# Patient Record
Sex: Male | Born: 1945 | Race: White | Hispanic: No | Marital: Married
Health system: Southern US, Community
[De-identification: ages and names within clinical notes are randomized; demographics above are authoritative.]

## PROBLEM LIST (undated history)

## (undated) DIAGNOSIS — E119 Type 2 diabetes mellitus without complications: Secondary | ICD-10-CM

## (undated) DIAGNOSIS — T7840XA Allergy, unspecified, initial encounter: Secondary | ICD-10-CM

## (undated) DIAGNOSIS — I10 Essential (primary) hypertension: Secondary | ICD-10-CM

## (undated) DIAGNOSIS — E785 Hyperlipidemia, unspecified: Secondary | ICD-10-CM

## (undated) DIAGNOSIS — N400 Enlarged prostate without lower urinary tract symptoms: Secondary | ICD-10-CM

## (undated) DIAGNOSIS — N200 Calculus of kidney: Secondary | ICD-10-CM

## (undated) DIAGNOSIS — M109 Gout, unspecified: Secondary | ICD-10-CM

## (undated) DIAGNOSIS — I00 Rheumatic fever without heart involvement: Secondary | ICD-10-CM

## (undated) DIAGNOSIS — M1990 Unspecified osteoarthritis, unspecified site: Secondary | ICD-10-CM

## (undated) HISTORY — DX: Allergy, unspecified, initial encounter: T78.40XA

## (undated) HISTORY — DX: Hyperlipidemia, unspecified: E78.5

## (undated) HISTORY — PX: BACK SURGERY: SHX140

## (undated) HISTORY — DX: Rheumatic fever without heart involvement: I00

## (undated) HISTORY — DX: Calculus of kidney: N20.0

## (undated) HISTORY — DX: Essential (primary) hypertension: I10

## (undated) HISTORY — DX: Unspecified osteoarthritis, unspecified site: M19.90

## (undated) HISTORY — PX: OTHER SURGICAL HISTORY: SHX169

---

## 1998-04-25 ENCOUNTER — Ambulatory Visit (HOSPITAL_COMMUNITY): Admission: RE | Admit: 1998-04-25 | Discharge: 1998-04-25 | Payer: Self-pay | Admitting: Neurosurgery

## 1998-04-25 ENCOUNTER — Encounter: Payer: Self-pay | Admitting: Neurosurgery

## 1998-04-28 ENCOUNTER — Encounter: Payer: Self-pay | Admitting: Neurosurgery

## 1998-04-29 ENCOUNTER — Encounter: Payer: Self-pay | Admitting: Neurosurgery

## 1998-04-29 ENCOUNTER — Inpatient Hospital Stay (HOSPITAL_COMMUNITY): Admission: RE | Admit: 1998-04-29 | Discharge: 1998-04-30 | Payer: Self-pay | Admitting: Neurosurgery

## 1998-12-20 ENCOUNTER — Encounter: Payer: Self-pay | Admitting: Neurosurgery

## 1998-12-20 ENCOUNTER — Ambulatory Visit (HOSPITAL_COMMUNITY): Admission: RE | Admit: 1998-12-20 | Discharge: 1998-12-20 | Payer: Self-pay | Admitting: Neurosurgery

## 2015-02-10 ENCOUNTER — Ambulatory Visit: Payer: Self-pay | Admitting: Family Medicine

## 2015-02-18 ENCOUNTER — Ambulatory Visit (INDEPENDENT_AMBULATORY_CARE_PROVIDER_SITE_OTHER): Payer: PPO | Admitting: Family Medicine

## 2015-02-18 ENCOUNTER — Encounter: Payer: Self-pay | Admitting: Family Medicine

## 2015-02-18 VITALS — BP 125/75 | HR 71 | Resp 16 | Ht 70.0 in | Wt 295.6 lb

## 2015-02-18 DIAGNOSIS — E669 Obesity, unspecified: Secondary | ICD-10-CM | POA: Diagnosis not present

## 2015-02-18 DIAGNOSIS — I10 Essential (primary) hypertension: Secondary | ICD-10-CM

## 2015-02-18 DIAGNOSIS — I129 Hypertensive chronic kidney disease with stage 1 through stage 4 chronic kidney disease, or unspecified chronic kidney disease: Secondary | ICD-10-CM | POA: Insufficient documentation

## 2015-02-18 DIAGNOSIS — E785 Hyperlipidemia, unspecified: Secondary | ICD-10-CM | POA: Diagnosis not present

## 2015-02-18 DIAGNOSIS — E119 Type 2 diabetes mellitus without complications: Secondary | ICD-10-CM

## 2015-02-18 DIAGNOSIS — M10079 Idiopathic gout, unspecified ankle and foot: Secondary | ICD-10-CM

## 2015-02-18 DIAGNOSIS — E1121 Type 2 diabetes mellitus with diabetic nephropathy: Secondary | ICD-10-CM | POA: Insufficient documentation

## 2015-02-18 DIAGNOSIS — Z7689 Persons encountering health services in other specified circumstances: Secondary | ICD-10-CM

## 2015-02-18 DIAGNOSIS — E1169 Type 2 diabetes mellitus with other specified complication: Secondary | ICD-10-CM | POA: Insufficient documentation

## 2015-02-18 DIAGNOSIS — M109 Gout, unspecified: Secondary | ICD-10-CM

## 2015-02-18 DIAGNOSIS — M1990 Unspecified osteoarthritis, unspecified site: Secondary | ICD-10-CM | POA: Diagnosis not present

## 2015-02-18 NOTE — Patient Instructions (Signed)
Continue all current meds at present level except discontinue Aleve/Adv il

## 2015-02-18 NOTE — Progress Notes (Signed)
Name: Danny Patterson   MRN: [id number]    DOB: 1946/01/22   Date:02/18/2015       Progress Note  Subjective  Chief Complaint  Chief Complaint  Patient presents with  . Establish Care    HPI Here to establish care.  Has been going to [place] for care and meds.  Plans to change provider away from [place].    Has HBP, DM, Gout, elevated lipids.  Says he cannot take ACEs or ARBs.  Some question of renal insufficiency. Hx of kidney stones.   BSs at home- not taken.  Has angioedema with ACE/ARB.  No problem-specific assessment & plan notes found for this encounter.   Past Medical History  Diagnosis Date  . Allergy   . Arthritis   . Hypertension   . Hyperlipidemia   . Rheumatic fever   . Kidney stone     History reviewed. No pertinent past surgical history.  Family History  Problem Relation Age of Onset  . Parkinson's disease Father 79  . Kidney disease Maternal Grandmother     Social History   Social History  . Marital Status: Married    Spouse Name: N/A  . Number of Children: N/A  . Years of Education: N/A   Occupational History  . Not on file.   Social History Main Topics  . Smoking status: Never Smoker   . Smokeless tobacco: Never Used  . Alcohol Use: 1.8 oz/week    3 Cans of beer per week  . Drug Use: No  . Sexual Activity: Not on file   Other Topics Concern  . Not on file   Social History Narrative  . No narrative on file     Current outpatient prescriptions:  .  allopurinol (ZYLOPRIM) 300 MG tablet, Take 300 mg by mouth daily. 1.5 daily, Disp: , Rfl:  .  amLODipine (NORVASC) 10 MG tablet, Take 10 mg by mouth daily., Disp: , Rfl:  .  colchicine 0.6 MG tablet, Take 0.6 mg by mouth as needed (3 on day one of flare then one daily after.)., Disp: , Rfl:  .  furosemide (LASIX) 20 MG tablet, Take 20 mg by mouth daily., Disp: , Rfl:  .  labetalol (NORMODYNE) 200 MG tablet, Take 200 mg by mouth 2 (two) times daily., Disp: , Rfl:  .  magnesium gluconate (MAGONATE)  500 MG tablet, Take 500 mg by mouth daily., Disp: , Rfl:  .  metFORMIN (GLUCOPHAGE) 1000 MG tablet, Take 1,000 mg by mouth 2 (two) times daily with a meal., Disp: , Rfl:  .  naproxen sodium (ANAPROX) 220 MG tablet, Take 220 mg by mouth 2 (two) times daily with a meal., Disp: , Rfl:  .  omega-3 acid ethyl esters (LOVAZA) 1 G capsule, Take by mouth 2 (two) times daily., Disp: , Rfl:  .  simvastatin (ZOCOR) 40 MG tablet, Take 40 mg by mouth daily. 1/2 tab daily, Disp: , Rfl:  .  traMADol (ULTRAM) 50 MG tablet, Take 50 mg by mouth every 6 (six) hours as needed., Disp: , Rfl:   Allergies  Allergen Reactions  . Ace Inhibitors     Angioedema.     Review of Systems  Constitutional: Negative for fever, chills, weight loss and malaise/fatigue.  HENT: Negative for hearing loss.   Eyes: Negative for blurred vision and double vision.  Respiratory: Negative for cough, shortness of breath and wheezing.   Cardiovascular: Positive for leg swelling. Negative for chest pain and palpitations.  Gastrointestinal: Positive  for heartburn (occ. with certain foods.). Negative for abdominal pain and blood in stool.  Genitourinary: Negative for dysuria, urgency and frequency.  Musculoskeletal: Negative for myalgias and joint pain.  Skin: Negative for rash.  Neurological: Negative for dizziness, tremors, weakness and headaches.      Objective  Filed Vitals:   02/18/15 1457  BP: 125/75  Pulse: 71  Resp: 16  Height: 5\' 10"  (1.778 m)  Weight: 295 lb 9.6 oz (134.083 kg)    Physical Exam  Constitutional: He is oriented to person, place, and time and well-developed, well-nourished, and in no distress. No distress.  HENT:  Head: Normocephalic and atraumatic.  Eyes: Conjunctivae and EOM are normal. Pupils are equal, round, and reactive to light. No scleral icterus.  Neck: Normal range of motion. Neck supple. Carotid bruit is not present. No thyromegaly present.  Cardiovascular: Normal rate, regular rhythm  and normal heart sounds.  Exam reveals no gallop and no friction rub.   No murmur heard. Pulmonary/Chest: No respiratory distress. He has no wheezes. He has no rales.  Abdominal: Soft. Bowel sounds are normal. He exhibits no mass. There is hepatomegaly. There is no tenderness. There is no rebound.    obese  Musculoskeletal: Normal range of motion. He exhibits edema (trace bilateral pedal edema).  Lymphadenopathy:    He has no cervical adenopathy.  Neurological: He is alert and oriented to person, place, and time.       No results found for this or any previous visit (from the past 2160 hour(s)).   Assessment & Plan  Problem List Items Addressed This Visit      Cardiovascular and Mediastinum   Hypertension   Relevant Medications   simvastatin (ZOCOR) 40 MG tablet   furosemide (LASIX) 20 MG tablet   labetalol (NORMODYNE) 200 MG tablet   amLODipine (NORVASC) 10 MG tablet   omega-3 acid ethyl esters (LOVAZA) 1 G capsule   Other Relevant Orders   Comprehensive Metabolic Panel (CMET)     Endocrine   Diabetes (HCC)   Relevant Medications   metFORMIN (GLUCOPHAGE) 1000 MG tablet   simvastatin (ZOCOR) 40 MG tablet   Other Relevant Orders   HgB A1c     Musculoskeletal and Integument   Arthritis   Relevant Medications   colchicine 0.6 MG tablet   allopurinol (ZYLOPRIM) 300 MG tablet   naproxen sodium (ANAPROX) 220 MG tablet   traMADol (ULTRAM) 50 MG tablet   Other Relevant Orders   CBC with Differential     Other   Gout   Relevant Orders   Uric acid   Hyperlipidemia   Relevant Medications   simvastatin (ZOCOR) 40 MG tablet   furosemide (LASIX) 20 MG tablet   labetalol (NORMODYNE) 200 MG tablet   amLODipine (NORVASC) 10 MG tablet   omega-3 acid ethyl esters (LOVAZA) 1 G capsule   Other Relevant Orders   Lipid Profile   Encounter to establish care - Primary    Other Visit Diagnoses    Obesity        Relevant Medications    metFORMIN (GLUCOPHAGE) 1000 MG tablet     Other Relevant Orders    TSH       Meds ordered this encounter  Medications  . metFORMIN (GLUCOPHAGE) 1000 MG tablet    Sig: Take 1,000 mg by mouth 2 (two) times daily with a meal.  . colchicine 0.6 MG tablet    Sig: Take 0.6 mg by mouth as needed (3 on day one of  flare then one daily after.).  Marland Kitchen simvastatin (ZOCOR) 40 MG tablet    Sig: Take 40 mg by mouth daily. 1/2 tab daily  . furosemide (LASIX) 20 MG tablet    Sig: Take 20 mg by mouth daily.  Marland Kitchen labetalol (NORMODYNE) 200 MG tablet    Sig: Take 200 mg by mouth 2 (two) times daily.  Marland Kitchen allopurinol (ZYLOPRIM) 300 MG tablet    Sig: Take 300 mg by mouth daily. 1.5 daily  . amLODipine (NORVASC) 10 MG tablet    Sig: Take 10 mg by mouth daily.  . magnesium gluconate (MAGONATE) 500 MG tablet    Sig: Take 500 mg by mouth daily.  . naproxen sodium (ANAPROX) 220 MG tablet    Sig: Take 220 mg by mouth 2 (two) times daily with a meal.  . omega-3 acid ethyl esters (LOVAZA) 1 G capsule    Sig: Take by mouth 2 (two) times daily.  . traMADol (ULTRAM) 50 MG tablet    Sig: Take 50 mg by mouth every 6 (six) hours as needed.    1. Encounter to establish care   2. Essential hypertension  - Comprehensive Metabolic Panel (CMET)  3. Type 2 diabetes mellitus without complication, without long-term current use of insulin (HCC)  - HgB A1c  4. Acute gout of ankle, unspecified cause, unspecified laterality  - Uric acid  5. Hyperlipidemia  - Lipid Profile  6. Arthritis  - CBC with Differential  7. Obesity  - TSH

## 2015-02-27 LAB — COMPREHENSIVE METABOLIC PANEL
A/G RATIO: 2 (ref 1.1–2.5)
ALBUMIN: 4.4 g/dL (ref 3.6–4.8)
ALT: 25 IU/L (ref 0–44)
AST: 28 IU/L (ref 0–40)
Alkaline Phosphatase: 65 IU/L (ref 39–117)
BILIRUBIN TOTAL: 0.5 mg/dL (ref 0.0–1.2)
BUN / CREAT RATIO: 17 (ref 10–22)
BUN: 22 mg/dL (ref 8–27)
CALCIUM: 9.7 mg/dL (ref 8.6–10.2)
CHLORIDE: 101 mmol/L (ref 97–106)
CO2: 25 mmol/L (ref 18–29)
Creatinine, Ser: 1.31 mg/dL — ABNORMAL HIGH (ref 0.76–1.27)
GFR, EST AFRICAN AMERICAN: 64 mL/min/{1.73_m2} (ref >59)
GFR, EST NON AFRICAN AMERICAN: 55 mL/min/{1.73_m2} — AB (ref >59)
GLUCOSE: 101 mg/dL — AB (ref 65–99)
Globulin, Total: 2.2 g/dL (ref 1.5–4.5)
Potassium: 4.7 mmol/L (ref 3.5–5.2)
Sodium: 141 mmol/L (ref 136–144)
TOTAL PROTEIN: 6.6 g/dL (ref 6.0–8.5)

## 2015-02-27 LAB — CBC WITH DIFFERENTIAL/PLATELET
BASOS ABS: 0.1 10*3/uL (ref 0.0–0.2)
BASOS: 1 %
EOS (ABSOLUTE): 0.1 10*3/uL (ref 0.0–0.4)
EOS: 1 %
HEMATOCRIT: 36.5 % — AB (ref 37.5–51.0)
HEMOGLOBIN: 12.7 g/dL (ref 12.6–17.7)
IMMATURE GRANS (ABS): 0 10*3/uL (ref 0.0–0.1)
Immature Granulocytes: 0 %
LYMPHS ABS: 2.1 10*3/uL (ref 0.7–3.1)
Lymphs: 30 %
MCH: 31.8 pg (ref 26.6–33.0)
MCHC: 34.8 g/dL (ref 31.5–35.7)
MCV: 92 fL (ref 79–97)
MONOCYTES: 5 %
Monocytes Absolute: 0.3 10*3/uL (ref 0.1–0.9)
NEUTROS ABS: 4.4 10*3/uL (ref 1.4–7.0)
Neutrophils: 63 %
Platelets: 169 10*3/uL (ref 150–379)
RBC: 3.99 x10E6/uL — ABNORMAL LOW (ref 4.14–5.80)
RDW: 14.8 % (ref 12.3–15.4)
WBC: 6.9 10*3/uL (ref 3.4–10.8)

## 2015-02-27 LAB — LIPID PANEL
CHOL/HDL RATIO: 3.6 ratio (ref 0.0–5.0)
Cholesterol, Total: 127 mg/dL (ref 100–199)
HDL: 35 mg/dL — ABNORMAL LOW (ref >39)
LDL CALC: 62 mg/dL (ref 0–99)
Triglycerides: 150 mg/dL — ABNORMAL HIGH (ref 0–149)
VLDL CHOLESTEROL CAL: 30 mg/dL (ref 5–40)

## 2015-02-27 LAB — URIC ACID: Uric Acid: 5.4 mg/dL (ref 3.7–8.6)

## 2015-02-27 LAB — TSH: TSH: 2.58 u[IU]/mL (ref 0.450–4.500)

## 2015-02-27 LAB — HEMOGLOBIN A1C
ESTIMATED AVERAGE GLUCOSE: 131 mg/dL
HEMOGLOBIN A1C: 6.2 % — AB (ref 4.8–5.6)

## 2015-04-22 ENCOUNTER — Encounter: Payer: Self-pay | Admitting: Family Medicine

## 2015-04-22 ENCOUNTER — Ambulatory Visit (INDEPENDENT_AMBULATORY_CARE_PROVIDER_SITE_OTHER): Payer: PPO | Admitting: Family Medicine

## 2015-04-22 VITALS — BP 120/70 | HR 68 | Temp 98.2°F | Resp 16 | Ht 71.0 in | Wt 272.4 lb

## 2015-04-22 DIAGNOSIS — I10 Essential (primary) hypertension: Secondary | ICD-10-CM

## 2015-04-22 DIAGNOSIS — E119 Type 2 diabetes mellitus without complications: Secondary | ICD-10-CM | POA: Diagnosis not present

## 2015-04-22 DIAGNOSIS — E785 Hyperlipidemia, unspecified: Secondary | ICD-10-CM

## 2015-04-22 DIAGNOSIS — M109 Gout, unspecified: Secondary | ICD-10-CM

## 2015-04-22 DIAGNOSIS — M10079 Idiopathic gout, unspecified ankle and foot: Secondary | ICD-10-CM

## 2015-04-22 NOTE — Progress Notes (Signed)
Name: Danny Patterson   MRN: [id number]    DOB: 07-12-1945   Date:04/22/2015       Progress Note  Subjective  Chief Complaint  Chief Complaint  Patient presents with  . Hypertension    HPI Here for f/u of HBP.  Also has DM.  Does not check Sugars at home.  No gout flairs recently.  Taking his chol med.  No problem-specific assessment & plan notes found for this encounter.   Past Medical History  Diagnosis Date  . Allergy   . Arthritis   . Hypertension   . Hyperlipidemia   . Rheumatic fever   . Kidney stone     History reviewed. No pertinent past surgical history.  Family History  Problem Relation Age of Onset  . Parkinson's disease Father 80  . Kidney disease Maternal Grandmother     Social History   Social History  . Marital Status: Married    Spouse Name: N/A  . Number of Children: N/A  . Years of Education: N/A   Occupational History  . Not on file.   Social History Main Topics  . Smoking status: Never Smoker   . Smokeless tobacco: Never Used  . Alcohol Use: 1.8 oz/week    3 Cans of beer per week  . Drug Use: No  . Sexual Activity: Not on file   Other Topics Concern  . Not on file   Social History Narrative     Current outpatient prescriptions:  .  allopurinol (ZYLOPRIM) 300 MG tablet, Take 300 mg by mouth daily. 1.5 daily, Disp: , Rfl:  .  amLODipine (NORVASC) 10 MG tablet, Take 10 mg by mouth daily., Disp: , Rfl:  .  colchicine 0.6 MG tablet, Take 0.6 mg by mouth as needed (3 on day one of flare then one daily after.)., Disp: , Rfl:  .  labetalol (NORMODYNE) 200 MG tablet, Take 200 mg by mouth 2 (two) times daily., Disp: , Rfl:  .  magnesium gluconate (MAGONATE) 500 MG tablet, Take 500 mg by mouth daily., Disp: , Rfl:  .  metFORMIN (GLUCOPHAGE) 1000 MG tablet, Take 1,000 mg by mouth 2 (two) times daily with a meal., Disp: , Rfl:  .  naproxen sodium (ANAPROX) 220 MG tablet, Take 220 mg by mouth 2 (two) times daily with a meal., Disp: , Rfl:  .   omega-3 acid ethyl esters (LOVAZA) 1 G capsule, Take by mouth 2 (two) times daily., Disp: , Rfl:  .  simvastatin (ZOCOR) 40 MG tablet, Take 40 mg by mouth daily. 1/2 tab daily, Disp: , Rfl:  .  traMADol (ULTRAM) 50 MG tablet, Take 50 mg by mouth every 6 (six) hours as needed. Reported on 04/22/2015, Disp: , Rfl:   Allergies  Allergen Reactions  . Ace Inhibitors     Angioedema.     Review of Systems  Constitutional: Negative for fever, chills, weight loss and malaise/fatigue.  HENT: Negative for hearing loss.   Eyes: Negative for blurred vision and double vision.  Respiratory: Negative for cough, shortness of breath and wheezing.   Cardiovascular: Negative for chest pain, palpitations and leg swelling.  Gastrointestinal: Negative for heartburn, abdominal pain and blood in stool.  Genitourinary: Negative for dysuria, urgency and frequency.  Musculoskeletal: Positive for back pain. Negative for myalgias.  Skin: Negative for rash.  Neurological: Negative for dizziness, tremors, weakness and headaches.      Objective  Filed Vitals:   04/22/15 1342  BP: 120/70  Pulse: 68  Temp: 98.2 F (36.8 C)  TempSrc: Oral  Resp: 16  Height:  (1.803 m)  Weight: 272 lb 6.4 oz (123.56 kg)    Physical Exam  Constitutional: He is oriented to person, place, and time and well-developed, well-nourished, and in no distress. No distress.  Note 23# weight loss over past 2 months.  HENT:  Head: Normocephalic and atraumatic.  Eyes: Conjunctivae and EOM are normal. Pupils are equal, round, and reactive to light. No scleral icterus.  Neck: Normal range of motion. Neck supple. Carotid bruit is not present. No thyromegaly present.  Cardiovascular: Normal rate, regular rhythm and normal heart sounds.  Exam reveals no gallop and no friction rub.   No murmur heard. Pulmonary/Chest: Effort normal and breath sounds normal. No respiratory distress. He has no wheezes. He has no rales.  Abdominal: He  exhibits no distension and no mass. There is no tenderness.  Musculoskeletal: He exhibits edema (1+ bilateral pedal edema).  Lymphadenopathy:    He has no cervical adenopathy.  Neurological: He is alert and oriented to person, place, and time.  Vitals reviewed.      Recent Results (from the past 2160 hour(s))  Comprehensive Metabolic Panel (CMET)     Status: Abnormal   Collection Time: 02/26/15  2:04 PM  Result Value Ref Range   Glucose 101 (H) 65 - 99 mg/dL   BUN 22 8 - 27 mg/dL   Creatinine, Ser 2.95 (H) 0.76 - 1.27 mg/dL   GFR calc non Af Amer 55 (L) >59 mL/min/1.73   GFR calc Af Amer 64 >59 mL/min/1.73   BUN/Creatinine Ratio 17 10 - 22   Sodium 141 136 - 144 mmol/L    Comment: **Effective March 03, 2015 the reference interval**   for Sodium, Serum will be changing to:                                             134 - 144    Potassium 4.7 3.5 - 5.2 mmol/L   Chloride 101 97 - 106 mmol/L    Comment: **Effective March 03, 2015 the reference interval**   for Chloride, Serum will be changing to:                                              96 - 106    CO2 25 18 - 29 mmol/L   Calcium 9.7 8.6 - 10.2 mg/dL   Total Protein 6.6 6.0 - 8.5 g/dL   Albumin 4.4 3.6 - 4.8 g/dL   Globulin, Total 2.2 1.5 - 4.5 g/dL   Albumin/Globulin Ratio 2.0 1.1 - 2.5   Bilirubin Total 0.5 0.0 - 1.2 mg/dL   Alkaline Phosphatase 65 39 - 117 IU/L   AST 28 0 - 40 IU/L   ALT 25 0 - 44 IU/L  CBC with Differential     Status: Abnormal   Collection Time: 02/26/15  2:04 PM  Result Value Ref Range   WBC 6.9 3.4 - 10.8 x10E3/uL   RBC 3.99 (L) 4.14 - 5.80 x10E6/uL   Hemoglobin 12.7 12.6 - 17.7 g/dL   Hematocrit 62.1 (L) 30.8 - 51.0 %   MCV 92 79 - 97 fL   MCH 31.8 26.6 - 33.0 pg  MCHC 34.8 31.5 - 35.7 g/dL   RDW 16.1 09.6 - 04.5 %   Platelets 169 150 - 379 x10E3/uL   Neutrophils 63 %   Lymphs 30 %   Monocytes 5 %   Eos 1 %   Basos 1 %   Neutrophils Absolute 4.4 1.4 - 7.0 x10E3/uL    Lymphocytes Absolute 2.1 0.7 - 3.1 x10E3/uL   Monocytes Absolute 0.3 0.1 - 0.9 x10E3/uL   EOS (ABSOLUTE) 0.1 0.0 - 0.4 x10E3/uL   Basophils Absolute 0.1 0.0 - 0.2 x10E3/uL   Immature Granulocytes 0 %   Immature Grans (Abs) 0.0 0.0 - 0.1 x10E3/uL  Lipid Profile     Status: Abnormal   Collection Time: 02/26/15  2:04 PM  Result Value Ref Range   Cholesterol, Total 127 100 - 199 mg/dL   Triglycerides 409 (H) 0 - 149 mg/dL   HDL 35 (L) >81 mg/dL   VLDL Cholesterol Cal 30 5 - 40 mg/dL   LDL Calculated 62 0 - 99 mg/dL   Chol/HDL Ratio 3.6 0.0 - 5.0 ratio units    Comment:                                   T. Chol/HDL Ratio                                             Men  Women                               1/2 Avg.Risk  3.4    3.3                                   Avg.Risk  5.0    4.4                                2X Avg.Risk  9.6    7.1                                3X Avg.Risk 23.4   11.0   HgB A1c     Status: Abnormal   Collection Time: 02/26/15  2:04 PM  Result Value Ref Range   Hgb A1c MFr Bld 6.2 (H) 4.8 - 5.6 %    Comment:          Pre-diabetes: 5.7 - 6.4          Diabetes: >6.4          Glycemic control for adults with diabetes: <7.0    Est. average glucose Bld gHb Est-mCnc 131 mg/dL  TSH     Status: None   Collection Time: 02/26/15  2:04 PM  Result Value Ref Range   TSH 2.580 0.450 - 4.500 uIU/mL  Uric acid     Status: None   Collection Time: 02/26/15  2:04 PM  Result Value Ref Range   Uric Acid 5.4 3.7 - 8.6 mg/dL    Comment:            Therapeutic target for gout patients: <6.0  Assessment & Plan  Problem List Items Addressed This Visit      Cardiovascular and Mediastinum   Hypertension - Primary     Endocrine   Diabetes (HCC)     Other   Gout   Hyperlipidemia      No orders of the defined types were placed in this encounter.   1. Essential hypertension Cont. meds  2. Acute gout of ankle, unspecified cause, unspecified laterality Cont.  meds  3. Type 2 diabetes mellitus without complication, without long-term current use of insulin (HCC) Cont med  4. Hyperlipidemia Cont med

## 2015-04-22 NOTE — Patient Instructions (Addendum)
Plan to check CMP, CBC, A1c on return

## 2015-07-21 ENCOUNTER — Ambulatory Visit (INDEPENDENT_AMBULATORY_CARE_PROVIDER_SITE_OTHER): Payer: PPO | Admitting: Family Medicine

## 2015-07-21 ENCOUNTER — Encounter: Payer: Self-pay | Admitting: Family Medicine

## 2015-07-21 VITALS — BP 130/60 | HR 66 | Temp 97.8°F | Resp 16 | Ht 71.0 in | Wt 255.0 lb

## 2015-07-21 DIAGNOSIS — E083513 Diabetes mellitus due to underlying condition with proliferative diabetic retinopathy with macular edema, bilateral: Secondary | ICD-10-CM

## 2015-07-21 DIAGNOSIS — R7309 Other abnormal glucose: Secondary | ICD-10-CM

## 2015-07-21 DIAGNOSIS — M1000 Idiopathic gout, unspecified site: Secondary | ICD-10-CM

## 2015-07-21 DIAGNOSIS — M5387 Other specified dorsopathies, lumbosacral region: Secondary | ICD-10-CM

## 2015-07-21 DIAGNOSIS — I10 Essential (primary) hypertension: Secondary | ICD-10-CM

## 2015-07-21 DIAGNOSIS — M5431 Sciatica, right side: Secondary | ICD-10-CM

## 2015-07-21 DIAGNOSIS — Z794 Long term (current) use of insulin: Secondary | ICD-10-CM | POA: Diagnosis not present

## 2015-07-21 LAB — POCT GLYCOSYLATED HEMOGLOBIN (HGB A1C): Hemoglobin A1C: 5.8

## 2015-07-21 MED ORDER — METFORMIN HCL 500 MG PO TABS
ORAL_TABLET | ORAL | Status: DC
Start: 2015-07-21 — End: 2016-03-08

## 2015-07-21 NOTE — Progress Notes (Signed)
Name: Danny Patterson   MRN: [id number]    DOB: 27-Dec-1945   Date:07/21/2015       Progress Note  Subjective  Chief Complaint  Chief Complaint  Patient presents with  . Hypertension  . Diabetes    last A1c 6.2% 12/16    HPI Here for f/u of DM and HBP.  C/o R sciatic pain and some difficulty walking with some foot drop. He has lost weight through diet and exercise. No problem-specific assessment & plan notes found for this encounter.   Past Medical History  Diagnosis Date  . Allergy   . Arthritis   . Hypertension   . Hyperlipidemia   . Rheumatic fever   . Kidney stone     History reviewed. No pertinent past surgical history.  Family History  Problem Relation Age of Onset  . Parkinson's disease Father 2968  . Kidney disease Maternal Grandmother     Social History   Social History  . Marital Status: Married    Spouse Name: N/A  . Number of Children: N/A  . Years of Education: N/A   Occupational History  . Not on file.   Social History Main Topics  . Smoking status: Never Smoker   . Smokeless tobacco: Never Used  . Alcohol Use: 1.8 oz/week    3 Cans of beer per week  . Drug Use: No  . Sexual Activity: Not on file   Other Topics Concern  . Not on file   Social History Narrative     Current outpatient prescriptions:  .  allopurinol (ZYLOPRIM) 300 MG tablet, Take 300 mg by mouth daily. 1.5 daily, Disp: , Rfl:  .  amLODipine (NORVASC) 10 MG tablet, Take 10 mg by mouth daily., Disp: , Rfl:  .  colchicine 0.6 MG tablet, Take 0.6 mg by mouth as needed (3 on day one of flare then one daily after.)., Disp: , Rfl:  .  labetalol (NORMODYNE) 200 MG tablet, Take 200 mg by mouth 2 (two) times daily., Disp: , Rfl:  .  magnesium gluconate (MAGONATE) 500 MG tablet, Take 500 mg by mouth daily., Disp: , Rfl:  .  metFORMIN (GLUCOPHAGE) 500 MG tablet, Take 1 tablet each AM with breakfast., Disp: 90 tablet, Rfl: 3 .  naproxen sodium (ANAPROX) 220 MG tablet, Take 220 mg by mouth 2  (two) times daily with a meal., Disp: , Rfl:  .  omega-3 acid ethyl esters (LOVAZA) 1 G capsule, Take by mouth 2 (two) times daily., Disp: , Rfl:  .  simvastatin (ZOCOR) 40 MG tablet, Take 40 mg by mouth daily. 1/2 tab daily, Disp: , Rfl:  .  traMADol (ULTRAM) 50 MG tablet, Take 50 mg by mouth every 6 (six) hours as needed. Reported on 04/22/2015, Disp: , Rfl:   Allergies  Allergen Reactions  . Ace Inhibitors     Angioedema.     Review of Systems  Constitutional: Negative for fever, chills, weight loss and malaise/fatigue.  HENT: Negative for hearing loss.   Eyes: Negative for blurred vision and double vision.  Respiratory: Negative for cough, shortness of breath and wheezing.   Cardiovascular: Negative for chest pain, palpitations and leg swelling.  Gastrointestinal: Negative for heartburn, abdominal pain and blood in stool.  Genitourinary: Negative for dysuria, urgency and frequency.  Musculoskeletal: Positive for back pain.  Skin: Negative for rash.  Neurological: Negative for tremors, weakness and headaches.       R foot drop with abnormal, unsure gait.  Objective  Filed Vitals:   07/21/15 1257 07/21/15 1328  BP: 108/74 130/60  Pulse: 66   Temp: 97.8 F (36.6 C)   TempSrc: Oral   Resp: 16   Height:  (1.803 m)   Weight: 255 lb (115.667 kg)     Physical Exam  Constitutional: He is oriented to person, place, and time and well-developed, well-nourished, and in no distress. No distress.  HENT:  Head: Normocephalic and atraumatic.  Eyes: Conjunctivae and EOM are normal. Pupils are equal, round, and reactive to light. No scleral icterus.  Neck: Normal range of motion. Neck supple. Carotid bruit is not present. No thyromegaly present.  Cardiovascular: Normal rate, regular rhythm, normal heart sounds and intact distal pulses.  Exam reveals no gallop and no friction rub.   No murmur heard. Pulmonary/Chest: Effort normal and breath sounds normal. No respiratory  distress. He has no wheezes. He has no rales. He exhibits no tenderness.  Abdominal: Soft. Bowel sounds are normal. He exhibits no distension, no abdominal bruit and no mass. There is no tenderness.  Musculoskeletal: He exhibits edema (1+ pwedal edema bilaterally.).  Lymphadenopathy:    He has no cervical adenopathy.  Neurological: He is alert and oriented to person, place, and time.  R sciatica with R foot drop.  Vitals reviewed.      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 metFORMIN (GLUCOPHAGE) 500 MG tablet    Sig: Take 1 tablet each AM with breakfast.    Dispense:  90 tablet    Refill:  3   1. Elevated glucose  - POCT HgB A1C-5.8  2. 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 - metFORMIN (GLUCOPHAGE) 500 MG tablet; Take 1 tablet each AM with breakfast.  Dispense: 90 tablet; Refill: 3 (decreased from 1000 mg/d).  3. Essential hypertension Cont. Am lodipine  4. 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 Idiopathic gout, unspecified chronicity, unspecified site Cont. Allopurinol

## 2015-07-28 DIAGNOSIS — M5416 Radiculopathy, lumbar region: Secondary | ICD-10-CM | POA: Diagnosis not present

## 2015-07-28 DIAGNOSIS — M549 Dorsalgia, unspecified: Secondary | ICD-10-CM | POA: Diagnosis not present

## 2015-08-01 DIAGNOSIS — M4806 Spinal stenosis, lumbar region: Secondary | ICD-10-CM | POA: Diagnosis not present

## 2015-08-01 DIAGNOSIS — M5416 Radiculopathy, lumbar region: Secondary | ICD-10-CM | POA: Diagnosis not present

## 2015-08-04 DIAGNOSIS — M5137 Other intervertebral disc degeneration, lumbosacral region: Secondary | ICD-10-CM | POA: Diagnosis not present

## 2015-08-04 DIAGNOSIS — Z6834 Body mass index (BMI) 34.0-34.9, adult: Secondary | ICD-10-CM | POA: Diagnosis not present

## 2015-08-19 ENCOUNTER — Other Ambulatory Visit: Payer: Self-pay | Admitting: Neurosurgery

## 2015-09-05 ENCOUNTER — Telehealth: Payer: Self-pay | Admitting: Family Medicine

## 2015-09-05 DIAGNOSIS — Z0100 Encounter for examination of eyes and vision without abnormal findings: Principal | ICD-10-CM

## 2015-09-05 DIAGNOSIS — E119 Type 2 diabetes mellitus without complications: Secondary | ICD-10-CM

## 2015-09-05 NOTE — Telephone Encounter (Signed)
Dr. Juanetta GoslingHawkins recommended the pt see an eye doctor.  Who should they schedule with at [facility] in regards to diabetes.  Patty's call back number is [phone]

## 2015-09-05 NOTE — Telephone Encounter (Signed)
Referral entered for eye exam.

## 2015-09-24 ENCOUNTER — Encounter (HOSPITAL_COMMUNITY)
Admission: RE | Admit: 2015-09-24 | Discharge: 2015-09-24 | Disposition: A | Discharge status: Home / Self Care | Payer: PPO | Source: Ambulatory Visit | Attending: Neurosurgery | Admitting: Neurosurgery

## 2015-09-24 ENCOUNTER — Encounter (HOSPITAL_COMMUNITY): Payer: Self-pay

## 2015-09-24 DIAGNOSIS — I10 Essential (primary) hypertension: Secondary | ICD-10-CM | POA: Insufficient documentation

## 2015-09-24 DIAGNOSIS — E119 Type 2 diabetes mellitus without complications: Secondary | ICD-10-CM | POA: Diagnosis not present

## 2015-09-24 DIAGNOSIS — Z7984 Long term (current) use of oral hypoglycemic drugs: Secondary | ICD-10-CM | POA: Diagnosis not present

## 2015-09-24 DIAGNOSIS — Z01818 Encounter for other preprocedural examination: Secondary | ICD-10-CM | POA: Insufficient documentation

## 2015-09-24 DIAGNOSIS — M5137 Other intervertebral disc degeneration, lumbosacral region: Secondary | ICD-10-CM | POA: Diagnosis not present

## 2015-09-24 DIAGNOSIS — I4510 Unspecified right bundle-branch block: Secondary | ICD-10-CM | POA: Insufficient documentation

## 2015-09-24 DIAGNOSIS — Z79899 Other long term (current) drug therapy: Secondary | ICD-10-CM | POA: Insufficient documentation

## 2015-09-24 DIAGNOSIS — Z0183 Encounter for blood typing: Secondary | ICD-10-CM | POA: Insufficient documentation

## 2015-09-24 DIAGNOSIS — Z01812 Encounter for preprocedural laboratory examination: Secondary | ICD-10-CM | POA: Diagnosis not present

## 2015-09-24 DIAGNOSIS — I440 Atrioventricular block, first degree: Secondary | ICD-10-CM | POA: Diagnosis not present

## 2015-09-24 DIAGNOSIS — E785 Hyperlipidemia, unspecified: Secondary | ICD-10-CM | POA: Insufficient documentation

## 2015-09-24 HISTORY — DX: Gout, unspecified: M10.9

## 2015-09-24 HISTORY — DX: Type 2 diabetes mellitus without complications: E11.9

## 2015-09-24 LAB — CBC
HCT: 36.7 % — ABNORMAL LOW (ref 39.0–52.0)
HEMOGLOBIN: 12.4 g/dL — AB (ref 13.0–17.0)
MCH: 30.9 pg (ref 26.0–34.0)
MCHC: 33.8 g/dL (ref 30.0–36.0)
MCV: 91.5 fL (ref 78.0–100.0)
PLATELETS: 194 10*3/uL (ref 150–400)
RBC: 4.01 MIL/uL — AB (ref 4.22–5.81)
RDW: 13.9 % (ref 11.5–15.5)
WBC: 6.8 10*3/uL (ref 4.0–10.5)

## 2015-09-24 LAB — BASIC METABOLIC PANEL
ANION GAP: 9 (ref 5–15)
BUN: 28 mg/dL — ABNORMAL HIGH (ref 6–20)
CHLORIDE: 99 mmol/L — AB (ref 101–111)
CO2: 26 mmol/L (ref 22–32)
Calcium: 9.6 mg/dL (ref 8.9–10.3)
Creatinine, Ser: 1.34 mg/dL — ABNORMAL HIGH (ref 0.61–1.24)
GFR calc Af Amer: >60 mL/min (ref >60)
GFR, EST NON AFRICAN AMERICAN: 52 mL/min — AB (ref >60)
Glucose, Bld: 95 mg/dL (ref 65–99)
POTASSIUM: 4.7 mmol/L (ref 3.5–5.1)
SODIUM: 134 mmol/L — AB (ref 135–145)

## 2015-09-24 LAB — TYPE AND SCREEN
ABO/RH(D): O POS
ANTIBODY SCREEN: NEGATIVE

## 2015-09-24 LAB — SURGICAL PCR SCREEN
MRSA, PCR: NEGATIVE
Staphylococcus aureus: NEGATIVE

## 2015-09-24 LAB — GLUCOSE, CAPILLARY: GLUCOSE-CAPILLARY: 89 mg/dL (ref 65–99)

## 2015-09-24 LAB — ABO/RH: ABO/RH(D): O POS

## 2015-09-24 NOTE — Pre-Procedure Instructions (Signed)
    Danny Patterson  09/24/2015      [id number] [place], [place] - [address] [name] [id number] Phone: [phone] Fax: [phone]    Your procedure is scheduled on 10/01/15.  [facility] at 630 A.M.  Call this number if you have problems the morning of surgery:  [phone]   Remember:  Do not eat food or drink liquids after midnight.  Take these medicines the morning of surgery with A SIP OF WATER --norvasc,labetalol   Do not wear jewelry, make-up or nail polish.  Do not wear lotions, powders, or perfumes.  You may wear deoderant.  Do not shave 48 hours prior to surgery.  Men may shave face and neck.  Do not bring valuables to the hospital.  [facility] is not responsible for any belongings or valuables.  Contacts, dentures or bridgework may not be worn into surgery.  Leave your suitcase in the car.  After surgery it may be brought to your room.  For patients admitted to the hospital, discharge time will be determined by your treatment team.  Patients discharged the day of surgery will not be allowed to drive home.   Name and phone number of your driver:    Special instructions:    Please read over the following fact sheets that you were given. MRSA Information

## 2015-09-25 LAB — HEMOGLOBIN A1C
Hgb A1c MFr Bld: 5.3 % (ref 4.8–5.6)
Mean Plasma Glucose: 105 mg/dL

## 2015-09-25 NOTE — Progress Notes (Signed)
Anesthesia Chart Review:  Pt is a 70 year old male scheduled for L2-3, L3-4, L4-5, L5-S1 decompression/ fusion/ pedicle screws on 10/01/2015 with Hilda LiasErnesto Botero, MD.   PMH includes:  HTN, DM, hyperlipidemia. Never smoker. BMI 35  Medications include: amlodipine, lasix, labetalol, metformin, simvastatin  Preoperative labs reviewed.  HgbA1c 5.3, glucose 95  EKG 09/24/15: Sinus rhythm with 1st degree A-V block. 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   If no changes, I anticipate pt can proceed with surgery as scheduled.   Rica Mastngela Keiva Dina, FNP-BC [facility]/Anesthesiology Phone: [phone] 09/25/2015 3:46 PM

## 2015-09-29 ENCOUNTER — Telehealth: Payer: Self-pay

## 2015-09-29 ENCOUNTER — Encounter: Payer: Self-pay | Admitting: Family Medicine

## 2015-09-29 DIAGNOSIS — R9431 Abnormal electrocardiogram [ECG] [EKG]: Secondary | ICD-10-CM

## 2015-09-29 NOTE — Telephone Encounter (Signed)
[id number] 

## 2015-09-29 NOTE — Progress Notes (Signed)
Since this is different from last EKG, I am going to get a Cardiology cnjsult to clear for surgery

## 2015-09-29 NOTE — Telephone Encounter (Signed)
Referral to Cardiology appt is scheduled for 10/22/2015 for [facility] [place] with Dr Herbie BaltimoreHarding. Patient will be called to be worked in as soon as [employer] account executivetomorrow. sd

## 2015-09-29 NOTE — Progress Notes (Signed)
Since this is new finding I am arranging Card. Consult to clear for surgery.

## 2015-09-30 MED ORDER — CEFAZOLIN SODIUM-DEXTROSE 2-4 GM/100ML-% IV SOLN
2.0000 g | INTRAVENOUS | Status: AC
  Administered 2015-10-01 (×2): 2 g via INTRAVENOUS
  Filled 2015-09-30: qty 100

## 2015-09-30 NOTE — Progress Notes (Signed)
Cardiology clearance appt is being arranged.-jh

## 2015-10-01 ENCOUNTER — Inpatient Hospital Stay (HOSPITAL_COMMUNITY): Payer: PPO

## 2015-10-01 ENCOUNTER — Encounter (HOSPITAL_COMMUNITY): Payer: Self-pay | Admitting: Certified Registered Nurse Anesthetist

## 2015-10-01 ENCOUNTER — Inpatient Hospital Stay (HOSPITAL_COMMUNITY)
Admission: RE | Admit: 2015-10-01 | Discharge: 2015-10-15 | DRG: 459 | Disposition: A | Discharge status: Rehabilitation Facility | Payer: PPO | Source: Ambulatory Visit | Attending: Internal Medicine | Admitting: Internal Medicine

## 2015-10-01 ENCOUNTER — Inpatient Hospital Stay (HOSPITAL_COMMUNITY): Payer: PPO | Admitting: Certified Registered Nurse Anesthetist

## 2015-10-01 ENCOUNTER — Inpatient Hospital Stay (HOSPITAL_COMMUNITY): Payer: PPO | Admitting: Emergency Medicine

## 2015-10-01 ENCOUNTER — Encounter (HOSPITAL_COMMUNITY)
Admission: RE | Disposition: A | Discharge status: Rehabilitation Facility | Payer: Self-pay | Source: Ambulatory Visit | Attending: Neurosurgery

## 2015-10-01 DIAGNOSIS — G9340 Encephalopathy, unspecified: Secondary | ICD-10-CM

## 2015-10-01 DIAGNOSIS — J9601 Acute respiratory failure with hypoxia: Secondary | ICD-10-CM | POA: Diagnosis not present

## 2015-10-01 DIAGNOSIS — R410 Disorientation, unspecified: Secondary | ICD-10-CM | POA: Diagnosis not present

## 2015-10-01 DIAGNOSIS — R0689 Other abnormalities of breathing: Secondary | ICD-10-CM | POA: Diagnosis not present

## 2015-10-01 DIAGNOSIS — Y92230 Patient room in hospital as the place of occurrence of the external cause: Secondary | ICD-10-CM | POA: Diagnosis not present

## 2015-10-01 DIAGNOSIS — F0280 Dementia in other diseases classified elsewhere without behavioral disturbance: Secondary | ICD-10-CM

## 2015-10-01 DIAGNOSIS — R609 Edema, unspecified: Secondary | ICD-10-CM | POA: Diagnosis not present

## 2015-10-01 DIAGNOSIS — D649 Anemia, unspecified: Secondary | ICD-10-CM | POA: Diagnosis not present

## 2015-10-01 DIAGNOSIS — Z7984 Long term (current) use of oral hypoglycemic drugs: Secondary | ICD-10-CM | POA: Diagnosis not present

## 2015-10-01 DIAGNOSIS — E785 Hyperlipidemia, unspecified: Secondary | ICD-10-CM | POA: Diagnosis not present

## 2015-10-01 DIAGNOSIS — I248 Other forms of acute ischemic heart disease: Secondary | ICD-10-CM | POA: Diagnosis not present

## 2015-10-01 DIAGNOSIS — E872 Acidosis: Secondary | ICD-10-CM | POA: Diagnosis present

## 2015-10-01 DIAGNOSIS — J9600 Acute respiratory failure, unspecified whether with hypoxia or hypercapnia: Secondary | ICD-10-CM | POA: Diagnosis not present

## 2015-10-01 DIAGNOSIS — J9811 Atelectasis: Secondary | ICD-10-CM | POA: Diagnosis not present

## 2015-10-01 DIAGNOSIS — M4316 Spondylolisthesis, lumbar region: Secondary | ICD-10-CM | POA: Diagnosis not present

## 2015-10-01 DIAGNOSIS — R339 Retention of urine, unspecified: Secondary | ICD-10-CM | POA: Diagnosis not present

## 2015-10-01 DIAGNOSIS — Z981 Arthrodesis status: Secondary | ICD-10-CM

## 2015-10-01 DIAGNOSIS — N182 Chronic kidney disease, stage 2 (mild): Secondary | ICD-10-CM | POA: Diagnosis present

## 2015-10-01 DIAGNOSIS — E876 Hypokalemia: Secondary | ICD-10-CM | POA: Diagnosis not present

## 2015-10-01 DIAGNOSIS — M1990 Unspecified osteoarthritis, unspecified site: Secondary | ICD-10-CM | POA: Diagnosis not present

## 2015-10-01 DIAGNOSIS — E1310 Other specified diabetes mellitus with ketoacidosis without coma: Secondary | ICD-10-CM | POA: Diagnosis not present

## 2015-10-01 DIAGNOSIS — M5117 Intervertebral disc disorders with radiculopathy, lumbosacral region: Secondary | ICD-10-CM | POA: Diagnosis not present

## 2015-10-01 DIAGNOSIS — D62 Acute posthemorrhagic anemia: Secondary | ICD-10-CM

## 2015-10-01 DIAGNOSIS — E119 Type 2 diabetes mellitus without complications: Secondary | ICD-10-CM | POA: Diagnosis present

## 2015-10-01 DIAGNOSIS — R4182 Altered mental status, unspecified: Secondary | ICD-10-CM | POA: Diagnosis not present

## 2015-10-01 DIAGNOSIS — Z4659 Encounter for fitting and adjustment of other gastrointestinal appliance and device: Secondary | ICD-10-CM

## 2015-10-01 DIAGNOSIS — R579 Shock, unspecified: Secondary | ICD-10-CM | POA: Diagnosis not present

## 2015-10-01 DIAGNOSIS — Z79899 Other long term (current) drug therapy: Secondary | ICD-10-CM

## 2015-10-01 DIAGNOSIS — M4726 Other spondylosis with radiculopathy, lumbar region: Secondary | ICD-10-CM | POA: Diagnosis not present

## 2015-10-01 DIAGNOSIS — I10 Essential (primary) hypertension: Secondary | ICD-10-CM | POA: Diagnosis present

## 2015-10-01 DIAGNOSIS — E870 Hyperosmolality and hypernatremia: Secondary | ICD-10-CM | POA: Diagnosis not present

## 2015-10-01 DIAGNOSIS — J189 Pneumonia, unspecified organism: Secondary | ICD-10-CM

## 2015-10-01 DIAGNOSIS — M51369 Other intervertebral disc degeneration, lumbar region without mention of lumbar back pain or lower extremity pain: Secondary | ICD-10-CM | POA: Diagnosis present

## 2015-10-01 DIAGNOSIS — J9602 Acute respiratory failure with hypercapnia: Secondary | ICD-10-CM | POA: Diagnosis not present

## 2015-10-01 DIAGNOSIS — G9341 Metabolic encephalopathy: Secondary | ICD-10-CM | POA: Diagnosis not present

## 2015-10-01 DIAGNOSIS — J690 Pneumonitis due to inhalation of food and vomit: Secondary | ICD-10-CM | POA: Diagnosis not present

## 2015-10-01 DIAGNOSIS — R451 Restlessness and agitation: Secondary | ICD-10-CM | POA: Diagnosis not present

## 2015-10-01 DIAGNOSIS — Z452 Encounter for adjustment and management of vascular access device: Secondary | ICD-10-CM

## 2015-10-01 DIAGNOSIS — I129 Hypertensive chronic kidney disease with stage 1 through stage 4 chronic kidney disease, or unspecified chronic kidney disease: Secondary | ICD-10-CM | POA: Diagnosis present

## 2015-10-01 DIAGNOSIS — R34 Anuria and oliguria: Secondary | ICD-10-CM

## 2015-10-01 DIAGNOSIS — Z794 Long term (current) use of insulin: Secondary | ICD-10-CM | POA: Diagnosis not present

## 2015-10-01 DIAGNOSIS — R0600 Dyspnea, unspecified: Secondary | ICD-10-CM

## 2015-10-01 DIAGNOSIS — R931 Abnormal findings on diagnostic imaging of heart and coronary circulation: Secondary | ICD-10-CM | POA: Diagnosis not present

## 2015-10-01 DIAGNOSIS — T424X5A Adverse effect of benzodiazepines, initial encounter: Secondary | ICD-10-CM | POA: Diagnosis not present

## 2015-10-01 DIAGNOSIS — N183 Chronic kidney disease, stage 3 unspecified: Secondary | ICD-10-CM | POA: Diagnosis present

## 2015-10-01 DIAGNOSIS — E1121 Type 2 diabetes mellitus with diabetic nephropathy: Secondary | ICD-10-CM

## 2015-10-01 DIAGNOSIS — D696 Thrombocytopenia, unspecified: Secondary | ICD-10-CM | POA: Diagnosis present

## 2015-10-01 DIAGNOSIS — T40605A Adverse effect of unspecified narcotics, initial encounter: Secondary | ICD-10-CM | POA: Diagnosis not present

## 2015-10-01 DIAGNOSIS — R05 Cough: Secondary | ICD-10-CM

## 2015-10-01 DIAGNOSIS — M21371 Foot drop, right foot: Secondary | ICD-10-CM | POA: Diagnosis not present

## 2015-10-01 DIAGNOSIS — M4317 Spondylolisthesis, lumbosacral region: Secondary | ICD-10-CM | POA: Diagnosis not present

## 2015-10-01 DIAGNOSIS — G960 Cerebrospinal fluid leak: Secondary | ICD-10-CM | POA: Diagnosis not present

## 2015-10-01 DIAGNOSIS — M5116 Intervertebral disc disorders with radiculopathy, lumbar region: Secondary | ICD-10-CM | POA: Diagnosis not present

## 2015-10-01 DIAGNOSIS — E083513 Diabetes mellitus due to underlying condition with proliferative diabetic retinopathy with macular edema, bilateral: Secondary | ICD-10-CM | POA: Diagnosis not present

## 2015-10-01 DIAGNOSIS — R059 Cough, unspecified: Secondary | ICD-10-CM

## 2015-10-01 DIAGNOSIS — M5136 Other intervertebral disc degeneration, lumbar region: Secondary | ICD-10-CM | POA: Diagnosis present

## 2015-10-01 DIAGNOSIS — I959 Hypotension, unspecified: Secondary | ICD-10-CM

## 2015-10-01 DIAGNOSIS — M545 Low back pain: Secondary | ICD-10-CM | POA: Diagnosis not present

## 2015-10-01 DIAGNOSIS — R0902 Hypoxemia: Secondary | ICD-10-CM | POA: Diagnosis not present

## 2015-10-01 DIAGNOSIS — G4733 Obstructive sleep apnea (adult) (pediatric): Secondary | ICD-10-CM

## 2015-10-01 DIAGNOSIS — R938 Abnormal findings on diagnostic imaging of other specified body structures: Secondary | ICD-10-CM | POA: Diagnosis not present

## 2015-10-01 DIAGNOSIS — M5127 Other intervertebral disc displacement, lumbosacral region: Secondary | ICD-10-CM | POA: Diagnosis not present

## 2015-10-01 DIAGNOSIS — E878 Other disorders of electrolyte and fluid balance, not elsewhere classified: Secondary | ICD-10-CM | POA: Diagnosis not present

## 2015-10-01 DIAGNOSIS — M109 Gout, unspecified: Secondary | ICD-10-CM | POA: Diagnosis present

## 2015-10-01 DIAGNOSIS — R7989 Other specified abnormal findings of blood chemistry: Secondary | ICD-10-CM | POA: Diagnosis not present

## 2015-10-01 DIAGNOSIS — Z419 Encounter for procedure for purposes other than remedying health state, unspecified: Secondary | ICD-10-CM

## 2015-10-01 DIAGNOSIS — J9690 Respiratory failure, unspecified, unspecified whether with hypoxia or hypercapnia: Secondary | ICD-10-CM

## 2015-10-01 DIAGNOSIS — R195 Other fecal abnormalities: Secondary | ICD-10-CM

## 2015-10-01 DIAGNOSIS — R9389 Abnormal findings on diagnostic imaging of other specified body structures: Secondary | ICD-10-CM

## 2015-10-01 DIAGNOSIS — R570 Cardiogenic shock: Secondary | ICD-10-CM | POA: Diagnosis not present

## 2015-10-01 DIAGNOSIS — E1169 Type 2 diabetes mellitus with other specified complication: Secondary | ICD-10-CM

## 2015-10-01 DIAGNOSIS — Z978 Presence of other specified devices: Secondary | ICD-10-CM

## 2015-10-01 DIAGNOSIS — Z87891 Personal history of nicotine dependence: Secondary | ICD-10-CM

## 2015-10-01 DIAGNOSIS — G3183 Dementia with Lewy bodies: Secondary | ICD-10-CM | POA: Diagnosis present

## 2015-10-01 DIAGNOSIS — Z6833 Body mass index (BMI) 33.0-33.9, adult: Secondary | ICD-10-CM

## 2015-10-01 DIAGNOSIS — F4321 Adjustment disorder with depressed mood: Secondary | ICD-10-CM | POA: Diagnosis not present

## 2015-10-01 DIAGNOSIS — R4781 Slurred speech: Secondary | ICD-10-CM

## 2015-10-01 HISTORY — PX: POSTERIOR LUMBAR FUSION 4 LEVEL: SHX6037

## 2015-10-01 LAB — BASIC METABOLIC PANEL
ANION GAP: 6 (ref 5–15)
BUN: 17 mg/dL (ref 6–20)
CHLORIDE: 102 mmol/L (ref 101–111)
CO2: 24 mmol/L (ref 22–32)
Calcium: 8 mg/dL — ABNORMAL LOW (ref 8.9–10.3)
Creatinine, Ser: 1.31 mg/dL — ABNORMAL HIGH (ref 0.61–1.24)
GFR calc non Af Amer: 54 mL/min — ABNORMAL LOW (ref >60)
Glucose, Bld: 170 mg/dL — ABNORMAL HIGH (ref 65–99)
POTASSIUM: 4.7 mmol/L (ref 3.5–5.1)
SODIUM: 132 mmol/L — AB (ref 135–145)

## 2015-10-01 LAB — GLUCOSE, CAPILLARY
GLUCOSE-CAPILLARY: 180 mg/dL — AB (ref 65–99)
GLUCOSE-CAPILLARY: 167 mg/dL — AB (ref 65–99)
Glucose-Capillary: 156 mg/dL — ABNORMAL HIGH (ref 65–99)
Glucose-Capillary: 179 mg/dL — ABNORMAL HIGH (ref 65–99)
Glucose-Capillary: 94 mg/dL (ref 65–99)

## 2015-10-01 LAB — CBC
HCT: 31 % — ABNORMAL LOW (ref 39.0–52.0)
Hemoglobin: 10.3 g/dL — ABNORMAL LOW (ref 13.0–17.0)
MCH: 30.6 pg (ref 26.0–34.0)
MCHC: 33.2 g/dL (ref 30.0–36.0)
MCV: 92 fL (ref 78.0–100.0)
PLATELETS: 136 10*3/uL — AB (ref 150–400)
RBC: 3.37 MIL/uL — AB (ref 4.22–5.81)
RDW: 14.1 % (ref 11.5–15.5)
WBC: 11.8 10*3/uL — AB (ref 4.0–10.5)

## 2015-10-01 SURGERY — POSTERIOR LUMBAR FUSION 4 LEVEL
Anesthesia: General | Site: Spine Lumbar

## 2015-10-01 MED ORDER — VANCOMYCIN HCL 1000 MG IV SOLR
INTRAVENOUS | Status: DC | PRN
  Administered 2015-10-01: 1000 mg via TOPICAL

## 2015-10-01 MED ORDER — PHENYLEPHRINE HCL 10 MG/ML IJ SOLN
10.0000 mg | INTRAVENOUS | Status: DC | PRN
  Administered 2015-10-01: 15 ug/min via INTRAVENOUS
  Administered 2015-10-01: 13:00:00 via INTRAVENOUS

## 2015-10-01 MED ORDER — LACTATED RINGERS IV SOLN
INTRAVENOUS | Status: DC | PRN
  Administered 2015-10-01 (×2): via INTRAVENOUS

## 2015-10-01 MED ORDER — PHENOL 1.4 % MT LIQD: 1.0000 | OROMUCOSAL | Status: DC | PRN

## 2015-10-01 MED ORDER — OXYCODONE-ACETAMINOPHEN 5-325 MG PO TABS
1.0000 | ORAL_TABLET | ORAL | Status: DC | PRN
  Administered 2015-10-01 – 2015-10-02 (×2): 2 via ORAL
  Administered 2015-10-02: 1 via ORAL
  Administered 2015-10-02 – 2015-10-03 (×6): 2 via ORAL
  Administered 2015-10-04 (×2): 1 via ORAL
  Administered 2015-10-04: 2 via ORAL
  Administered 2015-10-05: 1 via ORAL
  Filled 2015-10-01: qty 2
  Filled 2015-10-01: qty 1
  Filled 2015-10-01 (×5): qty 2
  Filled 2015-10-01 (×3): qty 1
  Filled 2015-10-01: qty 2
  Filled 2015-10-01: qty 1
  Filled 2015-10-01 (×2): qty 2

## 2015-10-01 MED ORDER — THROMBIN 20000 UNITS EX KIT
PACK | CUTANEOUS | Status: DC | PRN
  Administered 2015-10-01 (×2): 20000 [IU] via TOPICAL

## 2015-10-01 MED ORDER — SODIUM CHLORIDE 0.9 % IV SOLN
INTRAVENOUS | Status: DC | PRN
  Administered 2015-10-01: 11:00:00 via INTRAVENOUS

## 2015-10-01 MED ORDER — FENTANYL CITRATE (PF) 100 MCG/2ML IJ SOLN
INTRAMUSCULAR | Status: DC | PRN
  Administered 2015-10-01: 100 ug via INTRAVENOUS
  Administered 2015-10-01 (×4): 50 ug via INTRAVENOUS

## 2015-10-01 MED ORDER — SODIUM CHLORIDE 0.9 % IV SOLN: 250.0000 mL | INTRAVENOUS | Status: DC

## 2015-10-01 MED ORDER — MIDAZOLAM HCL 2 MG/2ML IJ SOLN
INTRAMUSCULAR | Status: AC
  Filled 2015-10-01: qty 2

## 2015-10-01 MED ORDER — ROCURONIUM BROMIDE 50 MG/5ML IV SOLN
INTRAVENOUS | Status: AC
  Filled 2015-10-01: qty 3

## 2015-10-01 MED ORDER — FENTANYL CITRATE (PF) 250 MCG/5ML IJ SOLN
INTRAMUSCULAR | Status: AC
  Filled 2015-10-01: qty 5

## 2015-10-01 MED ORDER — MAGNESIUM GLUCONATE 500 MG PO TABS
500.0000 mg | ORAL_TABLET | Freq: Every day | ORAL | Status: DC
  Administered 2015-10-02 – 2015-10-05 (×4): 500 mg via ORAL
  Filled 2015-10-01 (×5): qty 1

## 2015-10-01 MED ORDER — DIAZEPAM 5 MG PO TABS
10.0000 mg | ORAL_TABLET | Freq: Four times a day (QID) | ORAL | Status: DC | PRN
  Administered 2015-10-01 – 2015-10-05 (×8): 10 mg via ORAL
  Filled 2015-10-01 (×8): qty 2

## 2015-10-01 MED ORDER — CEFAZOLIN IN D5W 1 GM/50ML IV SOLN
1.0000 g | Freq: Three times a day (TID) | INTRAVENOUS | Status: AC
  Administered 2015-10-01 – 2015-10-02 (×2): 1 g via INTRAVENOUS
  Filled 2015-10-01 (×2): qty 50

## 2015-10-01 MED ORDER — DIAZEPAM 5 MG PO TABS
ORAL_TABLET | ORAL | Status: AC
  Filled 2015-10-01: qty 2

## 2015-10-01 MED ORDER — CEFAZOLIN SODIUM 1 G IJ SOLR
INTRAMUSCULAR | Status: AC
  Filled 2015-10-01: qty 20

## 2015-10-01 MED ORDER — PROMETHAZINE HCL 25 MG/ML IJ SOLN: 6.2500 mg | INTRAMUSCULAR | Status: DC | PRN

## 2015-10-01 MED ORDER — GELATIN ABSORBABLE MT POWD
OROMUCOSAL | Status: DC | PRN
  Administered 2015-10-01 (×2): 5 mL via TOPICAL

## 2015-10-01 MED ORDER — THROMBIN 20000 UNITS EX SOLR
CUTANEOUS | Status: DC | PRN
  Administered 2015-10-01: 13:00:00 via TOPICAL

## 2015-10-01 MED ORDER — SODIUM CHLORIDE 0.9% FLUSH
3.0000 mL | Freq: Two times a day (BID) | INTRAVENOUS | Status: DC
  Administered 2015-10-01 – 2015-10-05 (×7): 3 mL via INTRAVENOUS

## 2015-10-01 MED ORDER — ONDANSETRON HCL 4 MG/2ML IJ SOLN
INTRAMUSCULAR | Status: AC
  Filled 2015-10-01: qty 2

## 2015-10-01 MED ORDER — ACETAMINOPHEN 325 MG PO TABS
650.0000 mg | ORAL_TABLET | ORAL | Status: DC | PRN
  Administered 2015-10-07 – 2015-10-12 (×5): 650 mg via ORAL
  Filled 2015-10-01 (×7): qty 2

## 2015-10-01 MED ORDER — ONDANSETRON HCL 4 MG/2ML IJ SOLN
INTRAMUSCULAR | Status: DC | PRN
  Administered 2015-10-01: 4 mg via INTRAVENOUS

## 2015-10-01 MED ORDER — SODIUM CHLORIDE 0.9 % IV BOLUS (SEPSIS)
1000.0000 mL | Freq: Once | INTRAVENOUS | Status: AC
  Administered 2015-10-01: 1000 mL via INTRAVENOUS

## 2015-10-01 MED ORDER — ZOLPIDEM TARTRATE 5 MG PO TABS: 5.0000 mg | ORAL_TABLET | Freq: Every evening | ORAL | Status: DC | PRN

## 2015-10-01 MED ORDER — SUGAMMADEX SODIUM 500 MG/5ML IV SOLN
INTRAVENOUS | Status: AC
  Filled 2015-10-01: qty 5

## 2015-10-01 MED ORDER — SODIUM CHLORIDE 0.9 % IV SOLN
INTRAVENOUS | Status: DC
  Administered 2015-10-01 – 2015-10-03 (×3): via INTRAVENOUS
  Administered 2015-10-04 – 2015-10-05 (×2): 100 mL/h via INTRAVENOUS
  Administered 2015-10-05 – 2015-10-10 (×10): via INTRAVENOUS

## 2015-10-01 MED ORDER — LIDOCAINE 2% (20 MG/ML) 5 ML SYRINGE
INTRAMUSCULAR | Status: AC
  Filled 2015-10-01: qty 5

## 2015-10-01 MED ORDER — SUGAMMADEX SODIUM 500 MG/5ML IV SOLN
INTRAVENOUS | Status: DC | PRN
  Administered 2015-10-01: 222.2 mg via INTRAVENOUS

## 2015-10-01 MED ORDER — ALBUMIN HUMAN 5 % IV SOLN
INTRAVENOUS | Status: DC | PRN
  Administered 2015-10-01: 12:00:00 via INTRAVENOUS

## 2015-10-01 MED ORDER — LACTATED RINGERS IV SOLN
INTRAVENOUS | Status: DC | PRN
  Administered 2015-10-01 (×2): via INTRAVENOUS

## 2015-10-01 MED ORDER — HYDROMORPHONE HCL 1 MG/ML IJ SOLN
INTRAMUSCULAR | Status: AC
  Filled 2015-10-01: qty 1

## 2015-10-01 MED ORDER — HEMOSTATIC AGENTS (NO CHARGE) OPTIME
TOPICAL | Status: DC | PRN
  Administered 2015-10-01 (×3): 1 via TOPICAL

## 2015-10-01 MED ORDER — LIDOCAINE HCL (CARDIAC) 20 MG/ML IV SOLN
INTRAVENOUS | Status: DC | PRN
  Administered 2015-10-01: 80 mg via INTRAVENOUS

## 2015-10-01 MED ORDER — COLCHICINE 0.6 MG PO TABS: 0.6000 mg | ORAL_TABLET | ORAL | Status: DC | PRN

## 2015-10-01 MED ORDER — SENNA 8.6 MG PO TABS
1.0000 | ORAL_TABLET | Freq: Two times a day (BID) | ORAL | Status: DC
  Administered 2015-10-02 – 2015-10-05 (×4): 8.6 mg via ORAL
  Filled 2015-10-01 (×6): qty 1

## 2015-10-01 MED ORDER — METFORMIN HCL 500 MG PO TABS: 500.0000 mg | ORAL_TABLET | Freq: Every day | ORAL | Status: DC

## 2015-10-01 MED ORDER — EPHEDRINE 5 MG/ML INJ
INTRAVENOUS | Status: AC
  Filled 2015-10-01: qty 10

## 2015-10-01 MED ORDER — EPHEDRINE SULFATE 50 MG/ML IJ SOLN
INTRAMUSCULAR | Status: DC | PRN
  Administered 2015-10-01 (×9): 5 mg via INTRAVENOUS

## 2015-10-01 MED ORDER — SODIUM CHLORIDE 0.9% FLUSH: 3.0000 mL | INTRAVENOUS | Status: DC | PRN

## 2015-10-01 MED ORDER — SODIUM CHLORIDE 0.9 % IV SOLN: INTRAVENOUS | Status: DC

## 2015-10-01 MED ORDER — PROPOFOL 10 MG/ML IV BOLUS
INTRAVENOUS | Status: DC | PRN
  Administered 2015-10-01: 50 mg via INTRAVENOUS
  Administered 2015-10-01: 150 mg via INTRAVENOUS

## 2015-10-01 MED ORDER — ACETAMINOPHEN 650 MG RE SUPP
650.0000 mg | RECTAL | Status: DC | PRN
  Administered 2015-10-09: 650 mg via RECTAL
  Filled 2015-10-01: qty 1

## 2015-10-01 MED ORDER — FUROSEMIDE 20 MG PO TABS: 20.0000 mg | ORAL_TABLET | Freq: Every day | ORAL | Status: DC

## 2015-10-01 MED ORDER — 0.9 % SODIUM CHLORIDE (POUR BTL) OPTIME
TOPICAL | Status: DC | PRN
  Administered 2015-10-01 (×2): 1000 mL

## 2015-10-01 MED ORDER — MENTHOL 3 MG MT LOZG: 1.0000 | LOZENGE | OROMUCOSAL | Status: DC | PRN

## 2015-10-01 MED ORDER — PROPOFOL 10 MG/ML IV BOLUS
INTRAVENOUS | Status: AC
  Filled 2015-10-01: qty 40

## 2015-10-01 MED ORDER — DEXAMETHASONE SODIUM PHOSPHATE 10 MG/ML IJ SOLN
INTRAMUSCULAR | Status: DC | PRN
  Administered 2015-10-01: 10 mg via INTRAVENOUS

## 2015-10-01 MED ORDER — ROCURONIUM BROMIDE 100 MG/10ML IV SOLN
INTRAVENOUS | Status: DC | PRN
  Administered 2015-10-01: 20 mg via INTRAVENOUS
  Administered 2015-10-01 (×2): 5 mg via INTRAVENOUS
  Administered 2015-10-01: 50 mg via INTRAVENOUS
  Administered 2015-10-01: 10 mg via INTRAVENOUS
  Administered 2015-10-01: 20 mg via INTRAVENOUS
  Administered 2015-10-01 (×3): 10 mg via INTRAVENOUS

## 2015-10-01 MED ORDER — DEXAMETHASONE SODIUM PHOSPHATE 10 MG/ML IJ SOLN
INTRAMUSCULAR | Status: AC
  Filled 2015-10-01: qty 1

## 2015-10-01 MED ORDER — LABETALOL HCL 200 MG PO TABS: 200.0000 mg | ORAL_TABLET | Freq: Every day | ORAL | Status: DC

## 2015-10-01 MED ORDER — VANCOMYCIN HCL 1000 MG IV SOLR
INTRAVENOUS | Status: AC
  Filled 2015-10-01: qty 1000

## 2015-10-01 MED ORDER — MORPHINE SULFATE (PF) 2 MG/ML IV SOLN
1.0000 mg | INTRAVENOUS | Status: DC | PRN
  Administered 2015-10-04 – 2015-10-05 (×3): 1 mg via INTRAVENOUS
  Administered 2015-10-05: 2 mg via INTRAVENOUS
  Administered 2015-10-05: 1 mg via INTRAVENOUS
  Filled 2015-10-01 (×4): qty 1

## 2015-10-01 MED ORDER — HYDROMORPHONE HCL 1 MG/ML IJ SOLN
0.2500 mg | INTRAMUSCULAR | Status: DC | PRN
  Administered 2015-10-01 (×3): 0.5 mg via INTRAVENOUS

## 2015-10-01 MED ORDER — FENTANYL CITRATE (PF) 100 MCG/2ML IJ SOLN
25.0000 ug | INTRAMUSCULAR | Status: DC | PRN
  Administered 2015-10-01: 50 ug via INTRAVENOUS
  Administered 2015-10-01: 25 ug via INTRAVENOUS
  Administered 2015-10-02 – 2015-10-03 (×7): 50 ug via INTRAVENOUS
  Administered 2015-10-04: 25 ug via INTRAVENOUS
  Filled 2015-10-01 (×11): qty 2

## 2015-10-01 MED ORDER — ONDANSETRON HCL 4 MG/2ML IJ SOLN
4.0000 mg | INTRAMUSCULAR | Status: DC | PRN
  Administered 2015-10-02: 4 mg via INTRAVENOUS
  Filled 2015-10-01: qty 2

## 2015-10-01 MED ORDER — INSULIN ASPART 100 UNIT/ML ~~LOC~~ SOLN
0.0000 [IU] | SUBCUTANEOUS | Status: DC
  Administered 2015-10-01 – 2015-10-02 (×4): 3 [IU] via SUBCUTANEOUS
  Administered 2015-10-02 – 2015-10-04 (×3): 2 [IU] via SUBCUTANEOUS
  Administered 2015-10-05 (×2): 3 [IU] via SUBCUTANEOUS
  Administered 2015-10-06: 2 [IU] via SUBCUTANEOUS
  Administered 2015-10-06: 3 [IU] via SUBCUTANEOUS
  Administered 2015-10-06: 5 [IU] via SUBCUTANEOUS
  Administered 2015-10-06 – 2015-10-07 (×6): 3 [IU] via SUBCUTANEOUS

## 2015-10-01 MED ORDER — SIMVASTATIN 40 MG PO TABS
40.0000 mg | ORAL_TABLET | Freq: Every day | ORAL | Status: DC
  Administered 2015-10-02 – 2015-10-05 (×4): 40 mg via ORAL
  Filled 2015-10-01 (×4): qty 1

## 2015-10-01 MED ORDER — AMLODIPINE BESYLATE 10 MG PO TABS: 10.0000 mg | ORAL_TABLET | Freq: Every day | ORAL | Status: DC

## 2015-10-01 SURGICAL SUPPLY — 80 items
BENZOIN TINCTURE PRP APPL 2/3 (GAUZE/BANDAGES/DRESSINGS) ×3 IMPLANT
BLADE CLIPPER SURG (BLADE) IMPLANT
BUR ACORN 6.0 (BURR) ×2 IMPLANT
BUR ACORN 6.0MM (BURR) ×1
BUR MATCHSTICK NEURO 3.0 LAGG (BURR) ×3 IMPLANT
CANISTER SUCT 3000ML PPV (MISCELLANEOUS) ×3 IMPLANT
CAP LOCKING THREADED (Cap) ×24 IMPLANT
CLOSURE WOUND 1/2 X4 (GAUZE/BANDAGES/DRESSINGS) ×1
CONT SPEC 4OZ CLIKSEAL STRL BL (MISCELLANEOUS) ×3 IMPLANT
CORDS BIPOLAR (ELECTRODE) ×3 IMPLANT
COVER BACK TABLE 60X90IN (DRAPES) ×3 IMPLANT
CROSSLINK SPINAL FUSION (Cage) ×3 IMPLANT
DERMABOND ADVANCED (GAUZE/BANDAGES/DRESSINGS) ×4
DERMABOND ADVANCED .7 DNX12 (GAUZE/BANDAGES/DRESSINGS) ×2 IMPLANT
DEVICE DISSECT PLASMABLAD 3.0S (MISCELLANEOUS) ×1 IMPLANT
DRAIN SUBARACHNOID (WOUND CARE) ×3 IMPLANT
DRAPE C-ARM 42X72 X-RAY (DRAPES) ×6 IMPLANT
DRAPE LAPAROTOMY 100X72X124 (DRAPES) ×3 IMPLANT
DRAPE POUCH INSTRU U-SHP 10X18 (DRAPES) ×3 IMPLANT
DRSG OPSITE POSTOP 4X10 (GAUZE/BANDAGES/DRESSINGS) ×6 IMPLANT
DRSG PAD ABDOMINAL 8X10 ST (GAUZE/BANDAGES/DRESSINGS) IMPLANT
DURAPREP 26ML APPLICATOR (WOUND CARE) ×3 IMPLANT
ELECT BLADE 4.0 EZ CLEAN MEGAD (MISCELLANEOUS) ×3
ELECT REM PT RETURN 9FT ADLT (ELECTROSURGICAL) ×6
ELECTRODE BLDE 4.0 EZ CLN MEGD (MISCELLANEOUS) ×1 IMPLANT
ELECTRODE REM PT RTRN 9FT ADLT (ELECTROSURGICAL) ×2 IMPLANT
EVACUATOR 1/8 PVC DRAIN (DRAIN) IMPLANT
EVACUATOR 3/16  PVC DRAIN (DRAIN) ×2
EVACUATOR 3/16 PVC DRAIN (DRAIN) ×1 IMPLANT
GAUZE SPONGE 4X4 12PLY STRL (GAUZE/BANDAGES/DRESSINGS) ×3 IMPLANT
GAUZE SPONGE 4X4 16PLY XRAY LF (GAUZE/BANDAGES/DRESSINGS) ×15 IMPLANT
GLOVE BIOGEL M 8.0 STRL (GLOVE) ×3 IMPLANT
GLOVE EXAM NITRILE LRG STRL (GLOVE) IMPLANT
GLOVE EXAM NITRILE MD LF STRL (GLOVE) IMPLANT
GLOVE EXAM NITRILE XL STR (GLOVE) IMPLANT
GLOVE EXAM NITRILE XS STR PU (GLOVE) IMPLANT
GOWN STRL REUS W/ TWL LRG LVL3 (GOWN DISPOSABLE) ×1 IMPLANT
GOWN STRL REUS W/ TWL XL LVL3 (GOWN DISPOSABLE) ×1 IMPLANT
GOWN STRL REUS W/TWL 2XL LVL3 (GOWN DISPOSABLE) IMPLANT
GOWN STRL REUS W/TWL LRG LVL3 (GOWN DISPOSABLE) ×2
GOWN STRL REUS W/TWL XL LVL3 (GOWN DISPOSABLE) ×2
HEMOSTAT POWDER KIT SURGIFOAM (HEMOSTASIS) ×6 IMPLANT
IV NS 500ML (IV SOLUTION) ×2
IV NS 500ML BAXH (IV SOLUTION) ×1 IMPLANT
KIT BASIN OR (CUSTOM PROCEDURE TRAY) ×3 IMPLANT
KIT INFUSE LRG II (Orthopedic Implant) ×3 IMPLANT
KIT ROOM TURNOVER OR (KITS) ×3 IMPLANT
NEEDLE HYPO 18GX1.5 BLUNT FILL (NEEDLE) IMPLANT
NEEDLE HYPO 21X1.5 SAFETY (NEEDLE) IMPLANT
NEEDLE HYPO 25X1 1.5 SAFETY (NEEDLE) IMPLANT
NS IRRIG 1000ML POUR BTL (IV SOLUTION) ×3 IMPLANT
PACK LAMINECTOMY NEURO (CUSTOM PROCEDURE TRAY) ×3 IMPLANT
PAD ARMBOARD 7.5X6 YLW CONV (MISCELLANEOUS) ×9 IMPLANT
PATTIES SURGICAL .5 X1 (DISPOSABLE) ×3 IMPLANT
PATTIES SURGICAL .5 X3 (DISPOSABLE) IMPLANT
PATTIES SURGICAL 1X1 (DISPOSABLE) ×12 IMPLANT
PLASMABLADE 3.0S (MISCELLANEOUS) ×3
ROD CREO 100MM SPINAL (Rod) ×6 IMPLANT
SCREW CREO 5.5X50MM (Screw) ×6 IMPLANT
SCREW SPINE 40X5.5XPA CREO (Screw) ×6 IMPLANT
SCREW SPINE CREO 5.5X40 (Screw) ×12 IMPLANT
SEALER BIPOLAR AQUA 2.3 (INSTRUMENTS) ×3 IMPLANT
SPACER RISE 8X22 11-17MM-15 (Spacer) ×6 IMPLANT
SPONGE LAP 4X18 X RAY DECT (DISPOSABLE) ×6 IMPLANT
SPONGE NEURO XRAY DETECT 1X3 (DISPOSABLE) IMPLANT
SPONGE SURGIFOAM ABS GEL 100 (HEMOSTASIS) ×12 IMPLANT
STRIP CLOSURE SKIN 1/2X4 (GAUZE/BANDAGES/DRESSINGS) ×2 IMPLANT
SUT ETHILON 2 0 FS 18 (SUTURE) ×3 IMPLANT
SUT NURALON 4 0 TR CR/8 (SUTURE) ×3 IMPLANT
SUT VIC AB 0 CT1 18XCR BRD8 (SUTURE) ×1 IMPLANT
SUT VIC AB 0 CT1 8-18 (SUTURE) ×2
SUT VIC AB 1 CT1 18XBRD ANBCTR (SUTURE) ×2 IMPLANT
SUT VIC AB 1 CT1 8-18 (SUTURE) ×4
SUT VIC AB 2-0 CP2 18 (SUTURE) ×6 IMPLANT
SUT VIC AB 3-0 SH 8-18 (SUTURE) ×9 IMPLANT
SYR 5ML LL (SYRINGE) IMPLANT
TOWEL OR 17X24 6PK STRL BLUE (TOWEL DISPOSABLE) ×3 IMPLANT
TOWEL OR 17X26 10 PK STRL BLUE (TOWEL DISPOSABLE) ×3 IMPLANT
TRAY FOLEY W/METER SILVER 16FR (SET/KITS/TRAYS/PACK) ×3 IMPLANT
WATER STERILE IRR 1000ML POUR (IV SOLUTION) ×3 IMPLANT

## 2015-10-01 NOTE — Progress Notes (Signed)
Pt. Was placed on CPAP of 10cm H2O via nasal mask with 6L O2 bled in. Pt. Is tolerating CPAP well at this time without any complications.

## 2015-10-01 NOTE — Transfer of Care (Signed)
Immediate Anesthesia Transfer of Care Note  Patient: Danny Patterson  Procedure(s) Performed: Procedure(s): Lumbar three-four,  Lumbar four-five,  Lumbar five-Sacrum one posterior lumbar interbody fusion with Laminotomy at Lumbar Two-Three (N/A)  Patient Location: PACU  Anesthesia Type:General  Level of Consciousness: awake, alert , sedated, patient cooperative and responds to stimulation  Airway & Oxygen Therapy: Patient Spontanous Breathing and Patient connected to face mask oxygen  Post-op Assessment: Report given to RN, Post -op Vital signs reviewed and stable and Patient moving all extremities X 4  Post vital signs: Reviewed and stable  Last Vitals:  Filed Vitals:   10/01/15 0717  BP: 132/63  Pulse: 59  Temp: 36.6 C  Resp: 18    Last Pain:  Filed Vitals:   10/01/15 0719  PainSc: 5       Patients Stated Pain Goal: 3 (10/01/15 0713)  Complications: No apparent anesthesia complications

## 2015-10-01 NOTE — Anesthesia Preprocedure Evaluation (Addendum)
Anesthesia Evaluation  Patient identified by MRN, date of birth, ID band Patient awake    Reviewed: Allergy & Precautions, NPO status , Patient's Chart, lab work & pertinent test results  History of Anesthesia Complications Negative for: history of anesthetic complications  Airway Mallampati: I  TM Distance: >3 FB Neck ROM: Full    Dental  (+) Teeth Intact, Dental Advisory Given   Pulmonary neg pulmonary ROS,    breath sounds clear to auscultation       Cardiovascular hypertension,  Rhythm:Regular Rate:Normal     Neuro/Psych  Neuromuscular disease    GI/Hepatic negative GI ROS, Neg liver ROS,   Endo/Other  diabetes  Renal/GU negative Renal ROS     Musculoskeletal  (+) Arthritis ,   Abdominal (+) + obese,   Peds  Hematology negative hematology ROS (+)   Anesthesia Other Findings   Reproductive/Obstetrics                            Anesthesia Physical Anesthesia Plan  ASA: III  Anesthesia Plan: General   Post-op Pain Management:    Induction: Intravenous  Airway Management Planned: Oral ETT  Additional Equipment:   Intra-op Plan:   Post-operative Plan: Extubation in OR  Informed Consent: I have reviewed the patients History and Physical, chart, labs and discussed the procedure including the risks, benefits and alternatives for the proposed anesthesia with the patient or authorized representative who has indicated his/her understanding and acceptance.   Dental advisory given  Plan Discussed with: CRNA and Surgeon  Anesthesia Plan Comments:         Anesthesia Quick Evaluation

## 2015-10-01 NOTE — Anesthesia Procedure Notes (Addendum)
Procedure Name: Intubation Date/Time: 10/01/2015 8:43 AM Performed by: Virgel GessHOLTZMAN, Bisma Klett LEFFEW Pre-anesthesia Checklist: Patient identified, Emergency Drugs available, Suction available and Patient being monitored Patient Re-evaluated:Patient Re-evaluated prior to inductionOxygen Delivery Method: Circle System Utilized Preoxygenation: Pre-oxygenation with 100% oxygen Intubation Type: IV induction Ventilation: Mask ventilation without difficulty and Oral airway inserted - appropriate to patient size Laryngoscope Size: 3 Grade View: Grade II Tube type: Oral Tube size: 7.5 mm Number of attempts: 2 Airway Equipment and Method: Stylet and Oral airway Placement Confirmation: ETT inserted through vocal cords under direct vision,  positive ETCO2 and breath sounds checked- equal and bilateral Secured at: 22 cm Tube secured with: Tape Dental Injury: Teeth and Oropharynx as per pre-operative assessment

## 2015-10-01 NOTE — H&P (Signed)
Danny Patterson is an 70 y.o. male.   Chief Complaint: weakness of the right foot HPI: patient complaining of lumbar pain with raduation to thr right foot,worse in the afternoon,making quite difficult to drive. He has failed with evry conservative and is ready for surgery  Past Medical History  Diagnosis Date  . Allergy   . Arthritis   . Hypertension   . Hyperlipidemia   . Rheumatic fever   . Kidney stone   . Diabetes mellitus without complication (HCC)   . Gout     Past Surgical History  Procedure Laterality Date  . Knot      removed from neck  . Back surgery      Family History  Problem Relation Age of Onset  . Parkinson's disease Father 2268  . Kidney disease Maternal Grandmother    Social History:  reports that he has never smoked. He has never used smokeless tobacco. He reports that he drinks about 1.8 oz of alcohol per week. He reports that he does not use illicit drugs.  Allergies:  Allergies  Allergen Reactions  . Ace Inhibitors Other (See Comments)    Angioedema.    Medications Prior to Admission  Medication Sig Dispense Refill  . amLODipine (NORVASC) 10 MG tablet Take 10 mg by mouth daily.    Marland Kitchen. labetalol (NORMODYNE) 200 MG tablet Take 200 mg by mouth daily.     . magnesium gluconate (MAGONATE) 500 MG tablet Take 500 mg by mouth daily.    . metFORMIN (GLUCOPHAGE) 500 MG tablet Take 1 tablet each AM with breakfast. (Patient taking differently: Take 500 mg by mouth daily with breakfast. Take 1 tablet each AM with breakfast.) 90 tablet 3  . naproxen sodium (ANAPROX) 220 MG tablet Take 220 mg by mouth 2 (two) times daily as needed.     . colchicine 0.6 MG tablet Take 0.6 mg by mouth as needed (3 on day one of flare then one daily after.).    Marland Kitchen. furosemide (LASIX) 20 MG tablet Take 20 mg by mouth daily.    . simvastatin (ZOCOR) 40 MG tablet Take 40 mg by mouth daily.       Results for orders placed or performed during the hospital encounter of 10/01/15 (from the past  48 hour(s))  Glucose, capillary     Status: None   Collection Time: 10/01/15  8:12 AM  Result Value Ref Range   Glucose-Capillary 94 65 - 99 mg/dL   No results found.  Review of Systems  Constitutional: Negative.   Eyes: Negative.   Respiratory: Negative.   Cardiovascular: Negative.   Gastrointestinal: Negative.   Genitourinary: Negative.   Musculoskeletal: Positive for back pain.  Skin: Negative.   Neurological: Positive for sensory change and focal weakness.  Endo/Heme/Allergies: Negative.   Psychiatric/Behavioral: Negative.     Blood pressure 132/63, pulse 59, temperature 97.8 F (36.6 C), temperature source Oral, resp. rate 18, height 5\' 10"  (1.778 m), weight 111.131 kg (245 lb), SpO2 93 %. Physical Exam hent, nl. Neck, nl. Cv, nl. Lungs, ronchii. Abdomen, nl, extremities, nl. NEURO riht foot 0/5 on dorsiflexion, 4/5 plantoflexion. Lumbar rays shows spondylolisthesis at l3-4,4-5 with facet artropathy  Assessment/Plan Decompression and fusion at l34,45. During the surgery will decide about decompression and or fusion at l23, l5s1. He and family aware of risks and benefits    Karn CassisBOTERO,Aarushi Hemric M, MD 10/01/2015, 8:22 AM

## 2015-10-01 NOTE — Consult Note (Signed)
PULMONARY / CRITICAL CARE MEDICINE   Name: Danny Patterson MRN: [id number] DOB: May 31, 1945    ADMISSION DATE:  10/01/2015 CONSULTATION DATE:  7/12  REFERRING MD:  Jeral FruitBotero   CHIEF COMPLAINT:  Hypoxia   HISTORY OF PRESENT ILLNESS:   70yo male with hx HTN, DM, gout, chronic lumbar back pain with radiation to the R foot.  He underwent lumbar fusion 7/12 by Dr. Jeral FruitBotero.  He was extubated post op but is to remain flat and in the afternoon post op he developed some hypoxia and PCCM consulted.   PAST MEDICAL HISTORY :  He  has a past medical history of Allergy; Arthritis; Hypertension; Hyperlipidemia; Rheumatic fever; Kidney stone; Diabetes mellitus without complication (HCC); and Gout.  PAST SURGICAL HISTORY: He  has past surgical history that includes knot and Back surgery.  Allergies  Allergen Reactions  . Ace Inhibitors Other (See Comments)    Angioedema.    No current facility-administered medications on file prior to encounter.   Current Outpatient Prescriptions on File Prior to Encounter  Medication Sig  . amLODipine (NORVASC) 10 MG tablet Take 10 mg by mouth daily.  Marland Kitchen. labetalol (NORMODYNE) 200 MG tablet Take 200 mg by mouth daily.   . magnesium gluconate (MAGONATE) 500 MG tablet Take 500 mg by mouth daily.  . metFORMIN (GLUCOPHAGE) 500 MG tablet Take 1 tablet each AM with breakfast. (Patient taking differently: Take 500 mg by mouth daily with breakfast. Take 1 tablet each AM with breakfast.)  . naproxen sodium (ANAPROX) 220 MG tablet Take 220 mg by mouth 2 (two) times daily as needed.   . colchicine 0.6 MG tablet Take 0.6 mg by mouth as needed (3 on day one of flare then one daily after.).  Marland Kitchen. simvastatin (ZOCOR) 40 MG tablet Take 40 mg by mouth daily.     FAMILY HISTORY:  His indicated that his father is deceased.   SOCIAL HISTORY: He  reports that he has never smoked. He has never used smokeless tobacco. He reports that he drinks about 1.8 oz of alcohol per week. He reports  that he does not use illicit drugs.  REVIEW OF SYSTEMS:   Pt c/o b ack pain.  Denies chest pain, SOB.   SUBJECTIVE:    VITAL SIGNS: BP 97/50 mmHg  Pulse 58  Temp(Src) 96.3 F (35.7 C) (Axillary)  Resp 14  Ht 5\' 10"  (1.778 m)  Wt 109 kg (240 lb 4.8 oz)  BMI 34.48 kg/m2  SpO2 100%  HEMODYNAMICS:    VENTILATOR SETTINGS: Vent Mode:  [-]  FiO2 (%):  [40 %] 40 %  INTAKE / OUTPUT:    PHYSICAL EXAMINATION: General:  Chronically ill appearing male, NAD  Neuro:  Sedate, easily arousable but easily drifts back to sleep HEENT:  Mm moist, no JVD, venti mask, periorbital edema  Cardiovascular:  s1s2 rrr, brady  Lungs:  resps even, non labored, periods of apnea, diminished bases, otherwise clear  Abdomen:  Round, soft, non tender  Musculoskeletal:  Warm and dry, no edema   LABS:  BMET No results for input(s): NA, K, CL, CO2, BUN, CREATININE, GLUCOSE in the last 168 hours.  Electrolytes No results for input(s): CALCIUM, MG, PHOS in the last 168 hours.  CBC  Recent Labs Lab 10/01/15 1539  WBC 11.8*  HGB 10.3*  HCT 31.0*  PLT 136*    Coag's No results for input(s): APTT, INR in the last 168 hours.  Sepsis Markers No results for input(s): LATICACIDVEN, PROCALCITON, O2SATVEN in the  last 168 hours.  ABG No results for input(s): PHART, PCO2ART, PO2ART in the last 168 hours.  Liver Enzymes No results for input(s): AST, ALT, ALKPHOS, BILITOT, ALBUMIN in the last 168 hours.  Cardiac Enzymes No results for input(s): TROPONINI, PROBNP in the last 168 hours.  Glucose  Recent Labs Lab 10/01/15 0812 10/01/15 1454  GLUCAP 94 179*    Imaging Dg Lumbar Spine 2-3 Views  10/01/2015  CLINICAL DATA:  L2-L1 PLIF. EXAM: LUMBAR SPINE - 2-3 VIEW; DG C-ARM 61-120 MIN COMPARISON:  10/01/2015 and prior radiographs FINDINGS: Two intraoperative views of the lower lumbar spine demonstrate interbody devices at L3-4 and L5-S1. Bipedicular screws at L3, L4, L5 and S1 noted.  IMPRESSION: Surgical hardware as described. Electronically Signed   By: Harmon Pier M.D.   On: 10/01/2015 13:52   Dg Lumbar Spine 1 View  10/01/2015  CLINICAL DATA:  Localization image from L2 through S1 fusion EXAM: LUMBAR SPINE - 1 VIEW COMPARISON:  Lumbar spine series of Aug 01, 2015 and lumbar spine series of Jul 28, 2015 FINDINGS: A single lateral view of the lumbar spine labeled #1 is reviewed. There is surgical sponge material overlying the spinous processes of L3 and L4. Metallic clamps lie posterior to the sutures. There is degenerative narrowing of the discs from L2 through L5. IMPRESSION: Surgical sponges overlie the spinous processes of L3 and L4. Electronically Signed   By: Danny  Patterson M.D.   On: 10/01/2015 13:04   Dg C-arm 1-60 Min  10/01/2015  CLINICAL DATA:  L2-L1 PLIF. EXAM: LUMBAR SPINE - 2-3 VIEW; DG C-ARM 61-120 MIN COMPARISON:  10/01/2015 and prior radiographs FINDINGS: Two intraoperative views of the lower lumbar spine demonstrate interbody devices at L3-4 and L5-S1. Bipedicular screws at L3, L4, L5 and S1 noted. IMPRESSION: Surgical hardware as described. Electronically Signed   By: Harmon Pier M.D.   On: 10/01/2015 13:52     STUDIES:    CULTURES:   ANTIBIOTICS:   SIGNIFICANT EVENTS: 7/12 >> OR for lumbar fusion   LINES/TUBES:   DISCUSSION: 70yo male with hx HTN, DM, chronic back pain s/p lumbar fusion 7/12 with post op hypoventilation and hypoxia.   ASSESSMENT / PLAN:  PULMONARY Hypoxia  Presumed OSA  P:   CPAP qhs  Stat CXR  pulm hygiene  Titrate O2 as able to keep sats >92%  Monitor closely while required to lie flat   CARDIOVASCULAR Hypotension - mild  Hx HTN  P:  Hold home norvasc, lasix, labetolol  Gentle volume   RENAL Decreased UOP  P:   Check chem now and in am  Gentle volume   GASTROINTESTINAL No active issue  P:   NPO   HEMATOLOGIC Anemia - surgical blood loss  P:  F/u CBC   INFECTIOUS No active issue  P:   Monitor  wbc, fever curve off abx   ENDOCRINE DM    P:   Hold home metformin  SSI   NEUROLOGIC AMS - oversedation  Extensive lumbar fusion 7/12 P:   Limit sedation as able  CPAP as above    FAMILY  - Updates:  Wife, daughter updated at length at bedside 7/12   Dirk Dress, NP 10/01/2015  5:03 PM Pager: [phone] or [phone]  Attending note: 70 yo male with back pain.  He presented for L3-4-5 decompression.  Post op he was hypoxic associated with apnea episodes.  He was also hypotensive and oliguric.  Reported that he had received fentanyl, dilaudid,  and valium in PACU.  His wife reports he snores.  Somnolent, but wakes up with stimulation.  HR regular.  No wheeze.  Abd soft.  No edema.  Moves extremities.  CXR, BMET pending.  Hb 10.3, PLT 136, WBC 11.8.  Assessment/plan:  Hypoxia likely from atelectasis and undiagnosed OSA exacerbated by opiates/benzo's. - f/u CXR - oxygen to keep SpO2 > 92% - CPAP when asleep - limit opiates, benzo's as able  Hypotension, oliguria likely from volume depletion. - IV fluids - monitor renal fx, urine outpt - f/u BMET - hold outpt anti-HTN meds  DM. - SSI  S/p lumbar fusion. - post-op care per neurosurgery  Updated pt's family at bedside.  Coralyn Helling, MD [facility] Pulmonary/Critical Care 10/01/2015, 5:13 PM Pager:  [phone] After 3pm call: [phone]

## 2015-10-01 NOTE — Progress Notes (Signed)
Pt arrived from PACU with periods of apnea and O2 sats decreasing as low as 61%. Pt was placed on a venti mask, O2 sats increased until further periods of apnea. Urine output very low and reported 10ml out in PACU. Seal for foley was broken and reported by PACU RN that they irrigated it due to low output. Bladder scan completed by [facility] staff with result of 0mL. Lumbar drain attached to bed by string with difficulty monitoring output. Dr Franky Machoabbell notified of all the concerns and CCM consulted. CCM at bedside and new orders written and completed. Will continue to monitor. Dr Franky Machoabbell attached lumbar drain for closer monitoring of output. Will continue to monitor. Family at bedside, updated accordingly.

## 2015-10-02 ENCOUNTER — Encounter (HOSPITAL_COMMUNITY): Payer: Self-pay | Admitting: Neurosurgery

## 2015-10-02 DIAGNOSIS — J9600 Acute respiratory failure, unspecified whether with hypoxia or hypercapnia: Secondary | ICD-10-CM | POA: Diagnosis not present

## 2015-10-02 DIAGNOSIS — G4733 Obstructive sleep apnea (adult) (pediatric): Secondary | ICD-10-CM | POA: Diagnosis not present

## 2015-10-02 DIAGNOSIS — M5136 Other intervertebral disc degeneration, lumbar region: Secondary | ICD-10-CM

## 2015-10-02 DIAGNOSIS — J9601 Acute respiratory failure with hypoxia: Secondary | ICD-10-CM | POA: Diagnosis not present

## 2015-10-02 LAB — POCT I-STAT 4, (NA,K, GLUC, HGB,HCT)
Glucose, Bld: 155 mg/dL — ABNORMAL HIGH (ref 65–99)
Glucose, Bld: 175 mg/dL — ABNORMAL HIGH (ref 65–99)
HCT: 31 % — ABNORMAL LOW (ref 39.0–52.0)
HEMATOCRIT: 38 % — AB (ref 39.0–52.0)
HEMOGLOBIN: 12.9 g/dL — AB (ref 13.0–17.0)
Hemoglobin: 10.5 g/dL — ABNORMAL LOW (ref 13.0–17.0)
Potassium: 4.6 mmol/L (ref 3.5–5.1)
SODIUM: 128 mmol/L — AB (ref 135–145)
Sodium: 135 mmol/L (ref 135–145)

## 2015-10-02 LAB — BASIC METABOLIC PANEL
Anion gap: 10 (ref 5–15)
BUN: 16 mg/dL (ref 6–20)
CHLORIDE: 107 mmol/L (ref 101–111)
CO2: 19 mmol/L — AB (ref 22–32)
CREATININE: 1.07 mg/dL (ref 0.61–1.24)
Calcium: 7.9 mg/dL — ABNORMAL LOW (ref 8.9–10.3)
GFR calc Af Amer: >60 mL/min (ref >60)
Glucose, Bld: 126 mg/dL — ABNORMAL HIGH (ref 65–99)
POTASSIUM: 4.5 mmol/L (ref 3.5–5.1)
SODIUM: 136 mmol/L (ref 135–145)

## 2015-10-02 LAB — GLUCOSE, CAPILLARY
GLUCOSE-CAPILLARY: 156 mg/dL — AB (ref 65–99)
GLUCOSE-CAPILLARY: 134 mg/dL — AB (ref 65–99)
Glucose-Capillary: 106 mg/dL — ABNORMAL HIGH (ref 65–99)
Glucose-Capillary: 121 mg/dL — ABNORMAL HIGH (ref 65–99)
Glucose-Capillary: 124 mg/dL — ABNORMAL HIGH (ref 65–99)
Glucose-Capillary: 125 mg/dL — ABNORMAL HIGH (ref 65–99)

## 2015-10-02 LAB — CBC
HEMATOCRIT: 25 % — AB (ref 39.0–52.0)
HEMOGLOBIN: 8.7 g/dL — AB (ref 13.0–17.0)
MCH: 31.5 pg (ref 26.0–34.0)
MCHC: 34.8 g/dL (ref 30.0–36.0)
MCV: 90.6 fL (ref 78.0–100.0)
Platelets: 73 10*3/uL — ABNORMAL LOW (ref 150–400)
RBC: 2.76 MIL/uL — AB (ref 4.22–5.81)
RDW: 14.3 % (ref 11.5–15.5)
WBC: 9.6 10*3/uL (ref 4.0–10.5)

## 2015-10-02 LAB — PROTIME-INR
INR: 1.08 (ref 0.00–1.49)
PROTHROMBIN TIME: 14.2 s (ref 11.6–15.2)

## 2015-10-02 MED ORDER — LACTATED RINGERS IV SOLN
INTRAVENOUS | Status: AC
  Administered 2015-10-02 – 2015-10-03 (×3): via INTRAVENOUS

## 2015-10-02 NOTE — Progress Notes (Signed)
OT Cancellation Note  Patient Details Name: Danny StacksGary L Patterson MRN: [id number] DOB: [birth year]   Cancelled Treatment:    Reason Eval/Treat Not Completed: Medical issues which prohibited therapy (active bedrest orders)  Gaye AlkenBailey A Peterson Mathey M.S., OTR/L Pager: [phone]  10/02/2015, 7:18 AM

## 2015-10-02 NOTE — Progress Notes (Signed)
PULMONARY / CRITICAL CARE MEDICINE   Name: Danny Patterson MRN: [id number] DOB: 30-Mar-1945    ADMISSION DATE:  10/01/2015 CONSULTATION DATE:  7/12  REFERRING MD:  Jeral Fruit   CHIEF COMPLAINT:  Hypoxia   HISTORY OF PRESENT ILLNESS:   70yo male with hx HTN, DM, gout, chronic lumbar back pain with radiation to the R foot.  He underwent lumbar fusion 7/12 by Dr. Jeral Fruit.  He was extubated post op but is to remain flat and in the afternoon post op he developed some hypoxia and PCCM consulted.    SUBJECTIVE:  Breathing fine, lower extremity paresthesia greatly improved. CPAP was a "pain in the ass". Breathing fine today.  VITAL SIGNS: BP 97/51 mmHg  Pulse 84  Temp(Src) 97.5 F (36.4 C) (Oral)  Resp 14  Ht  (1.778 m)  Wt 109 kg (240 lb 4.8 oz)  BMI 34.48 kg/m2  SpO2 96%  HEMODYNAMICS:    VENTILATOR SETTINGS: Vent Mode:  [-]  FiO2 (%):  [40 %] 40 %  INTAKE / OUTPUT: I/O last 3 completed shifts: In: 6550 [P.O.:50; I.V.:4800; Blood:850; IV Piggyback:850] Out: 0865 [HQION:6295; Drains:541; Blood:3000]  PHYSICAL EXAMINATION: General:  Chronically ill appearing male, NAD  Neuro:  Alert, oriented, non-focal. Movement and sensation intact BLE HEENT:  Mm moist, no JVD Cardiovascular:  s1s2 rrr Lungs:  resps even, non labored, clear breath sounds Abdomen:  Round, soft, non tender  Musculoskeletal:  Warm and dry, no edema   LABS:  BMET  Recent Labs Lab 10/01/15 1814 10/02/15 0601  NA 132* 136  K 4.7 4.5  CL 102 107  CO2 24 19*  BUN 17 16  CREATININE 1.31* 1.07  GLUCOSE 170* 126*    Electrolytes  Recent Labs Lab 10/01/15 1814 10/02/15 0601  CALCIUM 8.0* 7.9*    CBC  Recent Labs Lab 10/01/15 1539 10/02/15 0601  WBC 11.8* 9.6  HGB 10.3* 8.7*  HCT 31.0* 25.0*  PLT 136* 73*    Coag's No results for input(s): APTT, INR in the last 168 hours.  Sepsis Markers No results for input(s): LATICACIDVEN, PROCALCITON, O2SATVEN in the last 168  hours.  ABG No results for input(s): PHART, PCO2ART, PO2ART in the last 168 hours.  Liver Enzymes No results for input(s): AST, ALT, ALKPHOS, BILITOT, ALBUMIN in the last 168 hours.  Cardiac Enzymes No results for input(s): TROPONINI, PROBNP in the last 168 hours.  Glucose  Recent Labs Lab 10/01/15 1454 10/01/15 1824 10/01/15 1931 10/01/15 2330 10/02/15 0331 10/02/15 0808  GLUCAP 179* 180* 167* 156* 156* 121*    Imaging Dg Lumbar Spine 2-3 Views  10/01/2015  CLINICAL DATA:  L2-L1 PLIF. EXAM: LUMBAR SPINE - 2-3 VIEW; DG C-ARM 61-120 MIN COMPARISON:  10/01/2015 and prior radiographs FINDINGS: Two intraoperative views of the lower lumbar spine demonstrate interbody devices at L3-4 and L5-S1. Bipedicular screws at L3, L4, L5 and S1 noted. IMPRESSION: Surgical hardware as described. Electronically Signed   By: Harmon Pier M.D.   On: 10/01/2015 13:52   Dg Chest Portable 1 View  10/01/2015  CLINICAL DATA:  Hypoxia x 1 day EXAM: PORTABLE CHEST 1 VIEW COMPARISON:  None. FINDINGS: The heart size and mediastinal contours are within normal limits. Both lungs are clear. The visualized skeletal structures are unremarkable. IMPRESSION: No active disease. Electronically Signed   By: Elige Ko   On: 10/01/2015 17:26   Dg C-arm 1-60 Min  10/01/2015  CLINICAL DATA:  L2-L1 PLIF. EXAM: LUMBAR SPINE - 2-3 VIEW; DG C-ARM  61-120 MIN COMPARISON:  10/01/2015 and prior radiographs FINDINGS: Two intraoperative views of the lower lumbar spine demonstrate interbody devices at L3-4 and L5-S1. Bipedicular screws at L3, L4, L5 and S1 noted. IMPRESSION: Surgical hardware as described. Electronically Signed   By: Harmon PierJeffrey  Hu M.D.   On: 10/01/2015 13:52     STUDIES:    CULTURES:   ANTIBIOTICS: Ancef periop  SIGNIFICANT EVENTS: 7/12 >> OR for lumbar fusion   LINES/TUBES:   DISCUSSION: 70yo male with hx HTN, DM, chronic back pain s/p lumbar fusion 7/12 with post op hypoventilation and hypoxia.    ASSESSMENT / PLAN:  PULMONARY Hypoxia  Presumed OSA  P:   Continue CPAP qhs  Will probably need a sleep study as an outpt  IS Titrate O2 as able to keep sats >92%   CARDIOVASCULAR Hypotension - >improving, but persists Hx HTN  P:  Hold home norvasc, lasix, labetolol  Gentle volume   RENAL Decreased UOP  NAG acidosis P:   Check chem now and in am  Will change maintenance fluids to LR and increase rate to 13600mL/hr  GASTROINTESTINAL No active issue  P:   NPO while lying flat  HEMATOLOGIC Anemia - surgical blood loss  Thrombocytopenia, suspect consumptive. Not on heparin P:  F/u CBC   INFECTIOUS No active issue  P:   Monitor wbc, fever curve off abx   ENDOCRINE DM    P:   Hold home metformin  SSI   NEUROLOGIC AMS - oversedation  Extensive lumbar fusion 7/12 P:   Limit sedation as able  CPAP as above    FAMILY  - Updates:  Wife, daughter updated at length at bedside 7/12  Joneen RoachPaul Hoffman, AGACNP-BC [facility] Pulmonology/Critical Care Pager [phone] or [phone]  10/02/2015 11:25 AM   Attending Note:  I have examined patient, reviewed labs, studies and notes. I have discussed the case with Henreitta LeberP Hoffman, and I agree with the data and plans as amended above. 70 yo man, s/p lumbar sgy. Post-op course c/b resp suppression in setting of narcs and probable undiagnosed OSA. He did try to wear CPAP last night. Note thrombocytopenia, suspect consumptive. On eval his he stable hemodynamically and from resp standpoint. We will try to continue CPAP qhs. He may need PSG as an outpatient.  Levy Pupaobert Sherlie Boyum, MD, PhD 10/02/2015, 11:52 AM  Pulmonary and Critical Care [phone] or if no answer [phone]

## 2015-10-02 NOTE — Progress Notes (Signed)
Patient ID: Danny StacksGary L Patterson, male   DOB: 08-Feb-1946, 70 y.o.   MRN: [id number] Stable. Csf draining well. Cbc done

## 2015-10-02 NOTE — Progress Notes (Signed)
Dr Jeral FruitBotero notified of hemovac becoming unattached at the bifurcation of the tubing and reattached. There is a broken area in tubing and would not stay together so it was taped for securement with hyperfix tape. Dr Jeral FruitBotero stated it was ok and he would be in later to assess. Will continue to monitor.

## 2015-10-02 NOTE — Progress Notes (Signed)
BP 141/78 mmHg  Pulse 67  Temp(Src) 98 F (36.7 C) (Oral)  Resp 14  Ht 5\' 10"  (1.778 m)  Wt 109 kg (240 lb 4.8 oz)  BMI 34.48 kg/m2  SpO2 100% Alert, following commands Though he states he cannot move left lower extremity well, he is moving it spontaneously and well Moving right lower extremity Sensation improving according to patient Wound dressing is dry hemovac working well, lumbar drain working.  Will monitor.

## 2015-10-02 NOTE — Op Note (Signed)
Danny Patterson, Danny Patterson NO.:  [id number]  MEDICAL RECORD NO.:  [id number]  LOCATION:  3M06C                        FACILITY:  [facility]  PHYSICIAN:  Hilda Lias, M.D.   DATE OF BIRTH:  1945-08-30  DATE OF PROCEDURE:  10/01/2015 DATE OF DISCHARGE:                              OPERATIVE REPORT   PREOPERATIVE DIAGNOSES:  Degenerative disk disease, lumbar spine, L2-3, L3-4, L4-5 and L5-S1 with spondylolisthesis.  Chronic radiculopathy, right footdrop.  Status post surgery on the right side many years ago.  POSTOPERATIVE DIAGNOSES:  Degenerative disk disease, lumbar spine, L2-3, L3-4, L4-5 and L5-S1 with spondylolisthesis.  Chronic radiculopathy, right footdrop.  Status post surgery on the right side many years ago.  PROCEDURES:  Bilateral L3, L4 and L5 laminectomy.  Lysis of adhesion. Bilateral L3-4, L4-5 and L5-S1 facetectomy.  Diskectomy at the level of L3-4 from the right side.  Diskectomy at the level of L5-S1 from the right side.  Interbody fusion with one cage at the level of L3-4 and L5- S1.  Pedicle screws from L3 to S1.  Bilateral hemilaminotomy of L2. Posterolateral arthrodesis with autograft and BMP.  Cell Saver.  C-arm.  SURGEON:  Hilda Lias, M.D.  ASSISTANT:  Coletta Memos, M.D.  CLINICAL HISTORY:  The patient was seen in my office several weeks ago, complaining of back pain radiation to both legs, associated with footdrop in the right side, which had been going on for about 6 months. This patient in the past has some type of right side decompression.  We talked about surgery.  The x-ray showed that he has spondylolisthesis with severe degenerative disk disease and facet arthropathy.  The patient knew the risk with surgery such as no improvement, need of further surgery, infection, CSF leak, hematoma.  DESCRIPTION OF PROCEDURE:  The patient was taken to the OR and after intubation, he was positioned in a prone manner.  The back was cleaned with  Betadine and DuraPrep.  Drapes were applied.  Midline incision from L2-L3 down to L5-S1 was made and muscle retracted laterally. Immediately as soon we started dissecting with the muscle, we found that the patient had more than normal bleeding.  Throughout, the patient never stopped taking the Naprosyn.  Nevertheless, we took x-ray and we proceeded with the removal of spinous process of 5, 4, 3 and perhaps 2. We did laminectomy bilateral of 3, 4 and 5, and we found quite a bit of adhesion in the right side.  Lysis was accomplished and then, we were able to decompress the foramen for the nerve roots bilaterally.  We feel the space at the level of 2-3 and we found that he has some stenosis and we proceeded with hemilaminotomy of L2 to decompress the L2 nerve root as well as the thecal sac.  We attempted to enter the disk space from the right side first, but it was impossible.  From then on, we proceeded in the left side at the level of 3-4.  We entered the disk space, total diskectomy was done and a cage with BMP and autograft, which was expanded to 17-mm was used.  The rest of the disk space was filled up  with BMP and autograft.  We attempted to do 4-5, it was impossible for either side.  The patient had quite a bit of scar tissue.  Then, at the level of 5-1, we were able to enter only from the left side with a total diskectomy was achieved, we inserted of cage, which was expanded to 17. Again, the rest of the disk space was filled up with autograft and BMP. Then, using the C-arm first in AP view and then a lateral view, we made holes in the pedicles of 3, 4, 5 and S1.  We inserted total of eight screws, six of them of 5.5 x 40 and two of them at the level of S1, 5.5 x 50.  There were connected with rod and Capps.  Crosslink was used. From then on, we went laterally and we removed the lateral aspect of the periosteum of the facet of 3-4, 4-5 and 5-1 and a mix of autograft and BMP were used  for arthrodesis.  There was scar tissue with some bulging of the arachnoid space and we decided to insert a catheter at the level of L2-L3.  This was done easily and CSF flew back normal.  Valsalva maneuver was negative.  Hemovac was left in the operative site. Vancomycin was placed also in the operative site and the wound was closed with different layer of Vicryl and nylon.  The lumbar drain was brought through a separate incision.  The patient is going to be flat in bed for the next 48-72 hours.          ______________________________ Hilda LiasErnesto Joel Patterson, M.D.     EB/MEDQ  D:  10/01/2015  T:  10/02/2015  Job:  [id number]

## 2015-10-02 NOTE — Progress Notes (Signed)
PT Cancellation Note  Patient Details Name: Danny Patterson MRN: [id number] DOB: [birth year]   Cancelled Treatment:    Reason Eval/Treat Not Completed: Patient not medically ready (active bedrest at this time)   Fabio AsaWerner, Hartman Minahan J 10/02/2015, 7:24 AM Charlotte Crumbevon Venda Dice, PT DPT  [phone]

## 2015-10-02 NOTE — Progress Notes (Signed)
0023 paged Dr. Franky Machoabbell. Pt stating he feels numb from the waist down and having difficulty moving his legs. Pt states he feels "woozy" and "out of his head" Pt is visibly shaking and stating he is in severe pain. Percocet and fentanyl given. Zofran given. Dr. Franky Machoabbell came to bedside to assess. Will monitor.

## 2015-10-02 NOTE — Progress Notes (Signed)
Orthopedic Tech Progress Note Patient Details:  Danny StacksGary L Patterson 1945/07/28 [id number] Patient already has brace. Patient ID: Danny StacksGary L Patterson, male   DOB: 1945/07/28, 70 y.o.   MRN: [id number]   [name], [name] 10/02/2015, 3:21 PM

## 2015-10-02 NOTE — Progress Notes (Signed)
Patient ID: Danny StacksGary L Patterson, male   DOB: [birth year], 70 y.o.   MRN: [id number] C/o incisional pain. Lumbar drain working well. Right foot better after having a foot drop for 6 months. Continue flat in bed with hob down to 5 degrees

## 2015-10-03 DIAGNOSIS — G4733 Obstructive sleep apnea (adult) (pediatric): Secondary | ICD-10-CM | POA: Diagnosis not present

## 2015-10-03 DIAGNOSIS — J9601 Acute respiratory failure with hypoxia: Secondary | ICD-10-CM | POA: Diagnosis not present

## 2015-10-03 DIAGNOSIS — M5136 Other intervertebral disc degeneration, lumbar region: Secondary | ICD-10-CM | POA: Diagnosis not present

## 2015-10-03 DIAGNOSIS — J9600 Acute respiratory failure, unspecified whether with hypoxia or hypercapnia: Secondary | ICD-10-CM | POA: Diagnosis not present

## 2015-10-03 LAB — BASIC METABOLIC PANEL
Anion gap: 6 (ref 5–15)
BUN: 10 mg/dL (ref 6–20)
CHLORIDE: 106 mmol/L (ref 101–111)
CO2: 28 mmol/L (ref 22–32)
Calcium: 8.5 mg/dL — ABNORMAL LOW (ref 8.9–10.3)
Creatinine, Ser: 0.94 mg/dL (ref 0.61–1.24)
GFR calc non Af Amer: >60 mL/min (ref >60)
Glucose, Bld: 120 mg/dL — ABNORMAL HIGH (ref 65–99)
POTASSIUM: 3.9 mmol/L (ref 3.5–5.1)
SODIUM: 140 mmol/L (ref 135–145)

## 2015-10-03 LAB — CBC
HCT: 25 % — ABNORMAL LOW (ref 39.0–52.0)
Hemoglobin: 8.3 g/dL — ABNORMAL LOW (ref 13.0–17.0)
MCH: 31.4 pg (ref 26.0–34.0)
MCHC: 33.2 g/dL (ref 30.0–36.0)
MCV: 94.7 fL (ref 78.0–100.0)
PLATELETS: 120 10*3/uL — AB (ref 150–400)
RBC: 2.64 MIL/uL — AB (ref 4.22–5.81)
RDW: 14.6 % (ref 11.5–15.5)
WBC: 8.6 10*3/uL (ref 4.0–10.5)

## 2015-10-03 LAB — GLUCOSE, CAPILLARY
GLUCOSE-CAPILLARY: 114 mg/dL — AB (ref 65–99)
GLUCOSE-CAPILLARY: 120 mg/dL — AB (ref 65–99)
GLUCOSE-CAPILLARY: 125 mg/dL — AB (ref 65–99)
Glucose-Capillary: 118 mg/dL — ABNORMAL HIGH (ref 65–99)
Glucose-Capillary: 127 mg/dL — ABNORMAL HIGH (ref 65–99)
Glucose-Capillary: 112 mg/dL — ABNORMAL HIGH (ref 65–99)

## 2015-10-03 MED FILL — Heparin Sodium (Porcine) Inj 1000 Unit/ML: INTRAMUSCULAR | Qty: 30 | Status: AC

## 2015-10-03 MED FILL — Sodium Chloride IV Soln 0.9%: INTRAVENOUS | Qty: 1000 | Status: AC

## 2015-10-03 NOTE — Progress Notes (Signed)
PT Cancellation Note  Patient Details Name: Danny Patterson MRN: [id number] DOB: September 26, 1945   Cancelled Treatment:    Reason Eval/Treat Not Completed: Patient not medically ready (active bedrest at this time)   Fabio AsaWerner, Na Waldrip J 10/03/2015, 7:55 AM Charlotte Crumbevon Sereniti Wan, PT DPT  [phone]

## 2015-10-03 NOTE — Progress Notes (Signed)
PULMONARY / CRITICAL CARE MEDICINE   Name: Danny Patterson MRN: [id number] DOB: Nov 05, 1945    ADMISSION DATE:  10/01/2015 CONSULTATION DATE:  7/12  REFERRING MD:  Jeral FruitBotero   CHIEF COMPLAINT:  Hypoxia   HISTORY OF PRESENT ILLNESS:   70yo male with hx HTN, DM, gout, chronic lumbar back pain with radiation to the R foot.  He underwent lumbar fusion 7/12 by Dr. Jeral FruitBotero.  He was extubated post op but is to remain flat and in the afternoon post op he developed some hypoxia and PCCM consulted.    SUBJECTIVE:  Refused CPAP last pm.   VITAL SIGNS: BP 127/55 mmHg  Pulse 89  Temp(Src) 98.6 F (37 C) (Oral)  Resp 13  Ht 5\' 10"  (1.778 m)  Wt 240 lb 4.8 oz (109 kg)  BMI 34.48 kg/m2  SpO2 99%  HEMODYNAMICS:    VENTILATOR SETTINGS:    INTAKE / OUTPUT: I/O last 3 completed shifts: In: 4213 [P.O.:560; I.V.:3603; IV Piggyback:50] Out: 5944 [Urine:5350; Drains:594]  PHYSICAL EXAMINATION: General:  Chronically ill appearing male, NAD in trendelenburg  Neuro:  Alert, oriented, non-focal. Movement and sensation intact BLE HEENT:  Mm dry no JVD Cardiovascular:  s1s2 rrr Lungs:  resps even, non labored, clear breath sounds Abdomen:  Round, soft, non tender  Musculoskeletal:  Warm and dry, no edema , hemovac now  out  LABS:  BMET  Recent Labs Lab 10/01/15 1814 10/02/15 0601 10/03/15 0231  NA 132* 136 140  K 4.7 4.5 3.9  CL 102 107 106  CO2 24 19* 28  BUN 17 16 10   CREATININE 1.31* 1.07 0.94  GLUCOSE 170* 126* 120*    Electrolytes  Recent Labs Lab 10/01/15 1814 10/02/15 0601 10/03/15 0231  CALCIUM 8.0* 7.9* 8.5*    CBC  Recent Labs Lab 10/01/15 1539 10/02/15 0601 10/03/15 0231  WBC 11.8* 9.6 8.6  HGB 10.3* 8.7* 8.3*  HCT 31.0* 25.0* 25.0*  PLT 136* 73* 120*    Coag's  Recent Labs Lab 10/02/15 1336  INR 1.08    Sepsis Markers No results for input(s): LATICACIDVEN, PROCALCITON, O2SATVEN in the last 168 hours.  ABG No results for input(s): PHART,  PCO2ART, PO2ART in the last 168 hours.  Liver Enzymes No results for input(s): AST, ALT, ALKPHOS, BILITOT, ALBUMIN in the last 168 hours.  Cardiac Enzymes No results for input(s): TROPONINI, PROBNP in the last 168 hours.  Glucose  Recent Labs Lab 10/02/15 1217 10/02/15 1626 10/02/15 1950 10/02/15 2338 10/03/15 0332 10/03/15 0804  GLUCAP 125* 134* 106* 124* 118* 125*    Imaging No results found.   STUDIES:    CULTURES:   ANTIBIOTICS: Ancef periop  SIGNIFICANT EVENTS: 7/12 >> OR for lumbar fusion   LINES/TUBES:   DISCUSSION: 70yo male with hx HTN, DM, chronic back pain s/p lumbar fusion 7/12 with post op hypoventilation and hypoxia. Improved  ASSESSMENT / PLAN:  PULMONARY Hypoxia  Presumed OSA  P:   D/c CPAP as he refuses Will probably need a sleep study as an outpt  IS Titrate O2 as able to keep sats >92%   CARDIOVASCULAR Hypotension - >improving, but persists Hx HTN  P:  Hold home norvasc, lasix, labetolol  Gentle volume   RENAL Lab Results  Component Value Date   CREATININE 0.94 10/03/2015   CREATININE 1.07 10/02/2015   CREATININE 1.31* 10/01/2015   Decreased UOP  NAG acidosis, resolved P:   Check chem as needed Decrease LR to 50cc/h, probably d/c on 7/15  GASTROINTESTINAL  No active issue  P:   Eating  HEMATOLOGIC Anemia - surgical blood loss  Thrombocytopenia, suspect consumptive. Not on heparin P:  F/u CBC   INFECTIOUS No active issue  P:   Monitor wbc, fever curve off abx   ENDOCRINE CBG (last 3)   Recent Labs  10/02/15 2338 10/03/15 0332 10/03/15 0804  GLUCAP 124* 118* 125*     DM    P:   Hold home metformin  SSI   NEUROLOGIC AMS - oversedation  Extensive lumbar fusion 7/12 Pain management P:   Pain has been more problematic 7/14 am. Will attempt to be judicious w sedation   FAMILY  - Updates:  Wife, daughter updated at length at bedside 7/12   [facility] Minor ACNP Adolph Pollack PCCM Pager [phone]  till 3 pm If no answer page [phone] 10/03/2015, 9:58 AM   Attending Note:  I have examined patient, reviewed labs, studies and notes. I have discussed the case with S Minor, and I agree with the data and plans as amended above. Recovering from lumbar sgy, still dealing with pain but resp status improved. On my eval today he is a bit more lethargic but he just received sedation adn narcotics. He did not wear CPAp last pm - we will d/c this. careful to balance resp status with sedating meds. Plan to stop LR on 7/15. Please call if we can help further.   Levy Pupa, MD, PhD 10/03/2015, 12:06 PM [facility] [phone] or if no answer [phone]

## 2015-10-03 NOTE — Care Management Note (Signed)
Case Management Note  Patient Details  Name: Danny Patterson MRN: [id number] Date of Birth: [birth year]  Subjective/Objective:    Pt admitted on 10/01/15 s/p lumbar fusion with post op hypoventilation/hypoxia.  PTA, pt independent, lives with spouse.                Action/Plan: Pt with lumbar drain on BR; PT/OT consults pending.  Will follow for discharge planning as pt progresses.    Expected Discharge Date:                  Expected Discharge Plan:  Home/Self Care  In-House Referral:     Discharge planning Services  CM Consult  Post Acute Care Choice:    Choice offered to:     DME Arranged:    DME Agency:     HH Arranged:    HH Agency:     Status of Service:  In process, will continue to follow  If discussed at Long Length of Stay Meetings, dates discussed:    Additional Comments:  Quintella BatonJulie W. Gwendolen Hewlett, RN, BSN  Trauma/Neuro ICU Case Manager [phone]

## 2015-10-03 NOTE — Progress Notes (Signed)
OT Cancellation Note  Patient Details Name: Rupert StacksGary L Stork MRN: [id number] DOB: 07-04-1945   Cancelled Treatment:    Reason Eval/Treat Not Completed: Medical issues which prohibited therapy (continues to have active bedrest orders)  Gaye AlkenBailey A Navjot Loera M.S., OTR/L Pager: [phone]  10/03/2015, 7:24 AM

## 2015-10-03 NOTE — Progress Notes (Signed)
Patient ID: Danny StacksGary L Patterson, male   DOB: 07-Nov-1945, 70 y.o.   MRN: [id number] hemovac discontinued. Lumbar drain working. C/o spasms. Continue hob down till Monday. Repeat cbc

## 2015-10-03 NOTE — Progress Notes (Signed)
Dr Jeral FruitBotero changed abd back dressing with abd pads. Pads saturated with serosanguinous fluid. Will continue to monitor.

## 2015-10-03 NOTE — Progress Notes (Signed)
Pt required Cpap for a while to rest tolerating well

## 2015-10-03 NOTE — Care Management Important Message (Signed)
Important Message  Patient Details  Name: Danny Patterson MRN: [id number] Date of Birth: 1945-12-10   Medicare Important Message Given:  Yes    Kyla BalzarineShealy, Enyah Moman Abena 10/03/2015, 11:34 AM

## 2015-10-04 LAB — BASIC METABOLIC PANEL
Anion gap: 6 (ref 5–15)
BUN: 7 mg/dL (ref 6–20)
CHLORIDE: 104 mmol/L (ref 101–111)
CO2: 29 mmol/L (ref 22–32)
CREATININE: 0.85 mg/dL (ref 0.61–1.24)
Calcium: 8.6 mg/dL — ABNORMAL LOW (ref 8.9–10.3)
GFR calc non Af Amer: >60 mL/min (ref >60)
Glucose, Bld: 130 mg/dL — ABNORMAL HIGH (ref 65–99)
POTASSIUM: 3.6 mmol/L (ref 3.5–5.1)
SODIUM: 139 mmol/L (ref 135–145)

## 2015-10-04 LAB — CBC WITH DIFFERENTIAL/PLATELET
BASOS ABS: 0 10*3/uL (ref 0.0–0.1)
Basophils Relative: 0 %
EOS ABS: 0.1 10*3/uL (ref 0.0–0.7)
Eosinophils Relative: 1 %
HCT: 24.7 % — ABNORMAL LOW (ref 39.0–52.0)
HEMOGLOBIN: 8.1 g/dL — AB (ref 13.0–17.0)
LYMPHS ABS: 1.8 10*3/uL (ref 0.7–4.0)
LYMPHS PCT: 19 %
MCH: 31.2 pg (ref 26.0–34.0)
MCHC: 32.8 g/dL (ref 30.0–36.0)
MCV: 95 fL (ref 78.0–100.0)
Monocytes Absolute: 0.8 10*3/uL (ref 0.1–1.0)
Monocytes Relative: 8 %
NEUTROS PCT: 72 %
Neutro Abs: 6.7 10*3/uL (ref 1.7–7.7)
Platelets: 121 10*3/uL — ABNORMAL LOW (ref 150–400)
RBC: 2.6 MIL/uL — AB (ref 4.22–5.81)
RDW: 14.2 % (ref 11.5–15.5)
WBC: 9.5 10*3/uL (ref 4.0–10.5)

## 2015-10-04 LAB — GLUCOSE, CAPILLARY
GLUCOSE-CAPILLARY: 124 mg/dL — AB (ref 65–99)
GLUCOSE-CAPILLARY: 103 mg/dL — AB (ref 65–99)
GLUCOSE-CAPILLARY: 107 mg/dL — AB (ref 65–99)
GLUCOSE-CAPILLARY: 119 mg/dL — AB (ref 65–99)
Glucose-Capillary: 147 mg/dL — ABNORMAL HIGH (ref 65–99)

## 2015-10-04 MED ORDER — ACETAZOLAMIDE 250 MG PO TABS
500.0000 mg | ORAL_TABLET | Freq: Two times a day (BID) | ORAL | Status: DC
  Administered 2015-10-04 – 2015-10-05 (×3): 500 mg via ORAL
  Filled 2015-10-04 (×4): qty 2

## 2015-10-04 MED ORDER — CELECOXIB 200 MG PO CAPS
200.0000 mg | ORAL_CAPSULE | Freq: Two times a day (BID) | ORAL | Status: DC
  Administered 2015-10-04 – 2015-10-05 (×2): 200 mg via ORAL
  Filled 2015-10-04 (×3): qty 1

## 2015-10-04 MED ORDER — GABAPENTIN 300 MG PO CAPS
300.0000 mg | ORAL_CAPSULE | Freq: Three times a day (TID) | ORAL | Status: DC
  Administered 2015-10-04 – 2015-10-05 (×2): 300 mg via ORAL
  Filled 2015-10-04 (×2): qty 1

## 2015-10-04 NOTE — Progress Notes (Signed)
No acute events CSF leaking around drain Incision looks good, dry and flat Moving legs well Increase CSF drainage Start diamox

## 2015-10-05 ENCOUNTER — Inpatient Hospital Stay (HOSPITAL_COMMUNITY): Payer: PPO

## 2015-10-05 ENCOUNTER — Encounter (HOSPITAL_COMMUNITY): Payer: Self-pay | Admitting: Radiology

## 2015-10-05 DIAGNOSIS — J9690 Respiratory failure, unspecified, unspecified whether with hypoxia or hypercapnia: Secondary | ICD-10-CM | POA: Diagnosis not present

## 2015-10-05 DIAGNOSIS — R0902 Hypoxemia: Secondary | ICD-10-CM | POA: Diagnosis not present

## 2015-10-05 DIAGNOSIS — R579 Shock, unspecified: Secondary | ICD-10-CM | POA: Diagnosis not present

## 2015-10-05 DIAGNOSIS — G9341 Metabolic encephalopathy: Secondary | ICD-10-CM | POA: Diagnosis not present

## 2015-10-05 DIAGNOSIS — R0689 Other abnormalities of breathing: Secondary | ICD-10-CM | POA: Diagnosis not present

## 2015-10-05 DIAGNOSIS — I248 Other forms of acute ischemic heart disease: Secondary | ICD-10-CM | POA: Diagnosis not present

## 2015-10-05 DIAGNOSIS — G960 Cerebrospinal fluid leak: Secondary | ICD-10-CM | POA: Diagnosis not present

## 2015-10-05 DIAGNOSIS — E870 Hyperosmolality and hypernatremia: Secondary | ICD-10-CM | POA: Diagnosis not present

## 2015-10-05 DIAGNOSIS — Z452 Encounter for adjustment and management of vascular access device: Secondary | ICD-10-CM | POA: Diagnosis not present

## 2015-10-05 DIAGNOSIS — J690 Pneumonitis due to inhalation of food and vomit: Secondary | ICD-10-CM | POA: Diagnosis not present

## 2015-10-05 DIAGNOSIS — Z4682 Encounter for fitting and adjustment of non-vascular catheter: Secondary | ICD-10-CM | POA: Diagnosis not present

## 2015-10-05 DIAGNOSIS — M5116 Intervertebral disc disorders with radiculopathy, lumbar region: Secondary | ICD-10-CM | POA: Diagnosis not present

## 2015-10-05 DIAGNOSIS — E872 Acidosis: Secondary | ICD-10-CM | POA: Diagnosis not present

## 2015-10-05 DIAGNOSIS — J9601 Acute respiratory failure with hypoxia: Secondary | ICD-10-CM | POA: Diagnosis not present

## 2015-10-05 DIAGNOSIS — J9602 Acute respiratory failure with hypercapnia: Secondary | ICD-10-CM | POA: Diagnosis not present

## 2015-10-05 DIAGNOSIS — R4182 Altered mental status, unspecified: Secondary | ICD-10-CM | POA: Diagnosis not present

## 2015-10-05 DIAGNOSIS — D62 Acute posthemorrhagic anemia: Secondary | ICD-10-CM | POA: Diagnosis not present

## 2015-10-05 LAB — CBC WITH DIFFERENTIAL/PLATELET
BASOS PCT: 0 %
Basophils Absolute: 0 10*3/uL (ref 0.0–0.1)
Eosinophils Absolute: 0 10*3/uL (ref 0.0–0.7)
Eosinophils Relative: 1 %
HCT: 22.7 % — ABNORMAL LOW (ref 39.0–52.0)
HEMOGLOBIN: 7.4 g/dL — AB (ref 13.0–17.0)
LYMPHS PCT: 8 %
Lymphs Abs: 0.6 10*3/uL — ABNORMAL LOW (ref 0.7–4.0)
MCH: 31.5 pg (ref 26.0–34.0)
MCHC: 32.6 g/dL (ref 30.0–36.0)
MCV: 96.6 fL (ref 78.0–100.0)
MONO ABS: 0.4 10*3/uL (ref 0.1–1.0)
MONOS PCT: 5 %
NEUTROS ABS: 6 10*3/uL (ref 1.7–7.7)
NEUTROS PCT: 86 %
Platelets: 150 10*3/uL (ref 150–400)
RBC: 2.35 MIL/uL — ABNORMAL LOW (ref 4.22–5.81)
RDW: 13.6 % (ref 11.5–15.5)
WBC: 7 10*3/uL (ref 4.0–10.5)

## 2015-10-05 LAB — COMPREHENSIVE METABOLIC PANEL
ALK PHOS: 39 U/L (ref 38–126)
ALT: 14 U/L — AB (ref 17–63)
ANION GAP: 8 (ref 5–15)
AST: 22 U/L (ref 15–41)
Albumin: 2.4 g/dL — ABNORMAL LOW (ref 3.5–5.0)
BILIRUBIN TOTAL: 0.7 mg/dL (ref 0.3–1.2)
BUN: 13 mg/dL (ref 6–20)
CALCIUM: 8.5 mg/dL — AB (ref 8.9–10.3)
CO2: 22 mmol/L (ref 22–32)
CREATININE: 1.12 mg/dL (ref 0.61–1.24)
Chloride: 109 mmol/L (ref 101–111)
Glucose, Bld: 153 mg/dL — ABNORMAL HIGH (ref 65–99)
Potassium: 4 mmol/L (ref 3.5–5.1)
SODIUM: 139 mmol/L (ref 135–145)
TOTAL PROTEIN: 4.8 g/dL — AB (ref 6.5–8.1)

## 2015-10-05 LAB — BLOOD GAS, ARTERIAL
ACID-BASE DEFICIT: 3.9 mmol/L — AB (ref 0.0–2.0)
Bicarbonate: 21.3 mEq/L (ref 20.0–24.0)
DRAWN BY: 28098
FIO2: 1
LHR: 22 {breaths}/min
O2 SAT: 99.7 %
PEEP/CPAP: 5 cmH2O
PH ART: 7.308 — AB (ref 7.350–7.450)
Patient temperature: 98.6
TCO2: 22.7 mmol/L (ref 0–100)
VT: 580 mL
pCO2 arterial: 43.8 mmHg (ref 35.0–45.0)
pO2, Arterial: 250 mmHg — ABNORMAL HIGH (ref 80.0–100.0)

## 2015-10-05 LAB — TROPONIN I: TROPONIN I: 0.05 ng/mL — AB (ref <0.03)

## 2015-10-05 LAB — GLUCOSE, CAPILLARY
GLUCOSE-CAPILLARY: 108 mg/dL — AB (ref 65–99)
GLUCOSE-CAPILLARY: 112 mg/dL — AB (ref 65–99)
GLUCOSE-CAPILLARY: 120 mg/dL — AB (ref 65–99)
GLUCOSE-CAPILLARY: 156 mg/dL — AB (ref 65–99)
Glucose-Capillary: 105 mg/dL — ABNORMAL HIGH (ref 65–99)
Glucose-Capillary: 163 mg/dL — ABNORMAL HIGH (ref 65–99)
Glucose-Capillary: 165 mg/dL — ABNORMAL HIGH (ref 65–99)

## 2015-10-05 LAB — LACTIC ACID, PLASMA: Lactic Acid, Venous: 0.7 mmol/L (ref 0.5–1.9)

## 2015-10-05 LAB — PROCALCITONIN: Procalcitonin: <0.1 ng/mL

## 2015-10-05 LAB — CORTISOL: Cortisol, Plasma: 30.6 ug/dL

## 2015-10-05 MED ORDER — VANCOMYCIN HCL IN DEXTROSE 750-5 MG/150ML-% IV SOLN
750.0000 mg | Freq: Two times a day (BID) | INTRAVENOUS | Status: DC
  Administered 2015-10-06 – 2015-10-07 (×3): 750 mg via INTRAVENOUS
  Filled 2015-10-05 (×4): qty 150

## 2015-10-05 MED ORDER — SODIUM CHLORIDE 0.9 % IV BOLUS (SEPSIS)
1000.0000 mL | Freq: Once | INTRAVENOUS | Status: AC
  Administered 2015-10-05: 1000 mL via INTRAVENOUS

## 2015-10-05 MED ORDER — PIPERACILLIN-TAZOBACTAM 3.375 G IVPB
3.3750 g | Freq: Three times a day (TID) | INTRAVENOUS | Status: DC
  Administered 2015-10-06 – 2015-10-12 (×20): 3.375 g via INTRAVENOUS
  Filled 2015-10-05 (×26): qty 50

## 2015-10-05 MED ORDER — NOREPINEPHRINE BITARTRATE 1 MG/ML IV SOLN
0.0000 ug/min | INTRAVENOUS | Status: DC
  Administered 2015-10-05: 2 ug/min via INTRAVENOUS
  Administered 2015-10-06: 30 ug/min via INTRAVENOUS
  Administered 2015-10-06: 35 ug/min via INTRAVENOUS
  Administered 2015-10-07: 15 ug/min via INTRAVENOUS
  Administered 2015-10-08: 4 ug/min via INTRAVENOUS
  Filled 2015-10-05 (×6): qty 16

## 2015-10-05 MED ORDER — VANCOMYCIN HCL 10 G IV SOLR
2000.0000 mg | Freq: Once | INTRAVENOUS | Status: DC
  Filled 2015-10-05: qty 2000

## 2015-10-05 MED ORDER — ANTISEPTIC ORAL RINSE SOLUTION (CORINZ): 7.0000 mL | Freq: Four times a day (QID) | OROMUCOSAL | Status: DC

## 2015-10-05 MED ORDER — PANTOPRAZOLE SODIUM 40 MG PO PACK
40.0000 mg | PACK | ORAL | Status: DC
  Administered 2015-10-06 – 2015-10-07 (×2): 40 mg
  Filled 2015-10-05 (×3): qty 20

## 2015-10-05 MED ORDER — MIDAZOLAM HCL 2 MG/2ML IJ SOLN
1.0000 mg | INTRAMUSCULAR | Status: DC | PRN
  Administered 2015-10-05 – 2015-10-06 (×4): 1 mg via INTRAVENOUS
  Filled 2015-10-05 (×4): qty 2

## 2015-10-05 MED ORDER — IOPAMIDOL (ISOVUE-370) INJECTION 76%
INTRAVENOUS | Status: AC
  Administered 2015-10-05: 80 mL
  Filled 2015-10-05: qty 100

## 2015-10-05 MED ORDER — FENTANYL CITRATE (PF) 100 MCG/2ML IJ SOLN
50.0000 ug | INTRAMUSCULAR | Status: DC | PRN
  Administered 2015-10-05 – 2015-10-06 (×4): 50 ug via INTRAVENOUS
  Filled 2015-10-05 (×4): qty 2

## 2015-10-05 MED ORDER — CHLORHEXIDINE GLUCONATE 0.12% ORAL RINSE (MEDLINE KIT)
15.0000 mL | Freq: Two times a day (BID) | OROMUCOSAL | Status: DC
  Administered 2015-10-05 – 2015-10-07 (×4): 15 mL via OROMUCOSAL

## 2015-10-05 MED ORDER — ANTISEPTIC ORAL RINSE SOLUTION (CORINZ)
7.0000 mL | OROMUCOSAL | Status: DC
  Administered 2015-10-06 – 2015-10-07 (×19): 7 mL via OROMUCOSAL

## 2015-10-05 MED ORDER — SIMVASTATIN 40 MG PO TABS
40.0000 mg | ORAL_TABLET | Freq: Every day | ORAL | Status: DC
  Administered 2015-10-07 – 2015-10-10 (×3): 40 mg
  Filled 2015-10-05 (×3): qty 1

## 2015-10-05 MED ORDER — HALOPERIDOL LACTATE 5 MG/ML IJ SOLN
2.0000 mg | Freq: Once | INTRAMUSCULAR | Status: AC
  Administered 2015-10-05: 2 mg via INTRAMUSCULAR
  Filled 2015-10-05: qty 1

## 2015-10-05 MED ORDER — QUETIAPINE FUMARATE 100 MG PO TABS
50.0000 mg | ORAL_TABLET | Freq: Two times a day (BID) | ORAL | Status: DC
  Administered 2015-10-05: 50 mg via ORAL
  Filled 2015-10-05: qty 1

## 2015-10-05 NOTE — Progress Notes (Signed)
Pharmacy Antibiotic Note  Danny Patterson is a 70 y.o. male admitted on 10/01/2015 with pneumonia.  Pharmacy has been consulted for vancomycin and zosyn dosing. Tmax is 100.4 and WBC is WNL. Scr is WNL. Pt required intubation today for airway protection.   Plan: - Vanc 2gm IV x 1 then 750mg  IV Q12H - Zosyn 3.375gm IV Q8H (4 hr inf) - F/u renal fxn, C&S, clinical status and trough at SS  Height: 5\' 10"  (177.8 cm) Weight: 240 lb 4.8 oz (109 kg) IBW/kg (Calculated) : 73  Temp (24hrs), Avg:98.5 F (36.9 C), Min:97.4 F (36.3 C), Max:100.4 F (38 C)   Recent Labs Lab 10/01/15 1539 10/01/15 1814 10/02/15 0601 10/03/15 0231 10/04/15 0302  WBC 11.8*  --  9.6 8.6 9.5  CREATININE  --  1.31* 1.07 0.94 0.85    Estimated Creatinine Clearance: 100 mL/min (by C-G formula based on Cr of 0.85).    Allergies  Allergen Reactions  . Ace Inhibitors Other (See Comments)    Angioedema.    Antimicrobials this admission: Vanc 7/16>> Zosyn 7/16>>  Dose adjustments this admission: N/A  Microbiology results: Pending  Thank you for allowing pharmacy to be a part of this patient's care.  Yailyn Strack, Drake LeachRachel Lynn 10/05/2015 5:37 PM

## 2015-10-05 NOTE — Procedures (Signed)
Intubation Procedure Note Rupert StacksGary L Kurek [id number] Aug 20, 1945  Procedure: Intubation Indications: Respiratory insufficiency  Procedure Details Time Out: Verified patient identification, verified procedure, site/side was marked, verified correct patient position, special equipment/implants available, medications/allergies/relevent history reviewed, required imaging and test results available.  Performed  Maximum sterile technique was used including cap, gloves, gown, hand hygiene and mask.  MAC and 4    Evaluation Hemodynamic Status:O2 sats: stable throughout Patient's Current Condition: stable Complications: No apparent complications Patient did tolerate procedure well. Chest X-ray ordered to verify placement.  CXR: pending  .   Cherylin MylarDoyle, Estoria Geary 10/05/2015

## 2015-10-05 NOTE — Progress Notes (Signed)
Agitated overnight Dressing dry Moves legs well Continue lumbar drain Start seroquel

## 2015-10-05 NOTE — Progress Notes (Signed)
Upon beginning my shift this morning the patient had another episode of sudden onset of overwhelming pain. The patient had been resting comfortably all night. All of the sudden the patient went from sleeping to awake and removing all medical equipment, including two PIV's. The patient was confused and trying to get out of bed while being verbally and physically aggressive. A prn med was given. About 10 minutes after this episode began the patient returned to a more relaxed state and was able to go back to sleep. Nsx was notified and new orders were received. Will continue to monitor.

## 2015-10-05 NOTE — Progress Notes (Signed)
RT Note: 1700 called to room by RN due to spo2 20%, on arrival RN had already started to bag pt, oral airway placed and continued to bag pt, spo2 quickly increased to 90%, pt then intubated by MD and placed on vent, will follow up with ABG X1hr, RT will monitor.

## 2015-10-05 NOTE — Progress Notes (Signed)
Patient was resting comfortably and awoke to a very agitated state and was experience increased pain. PRN meds were given. CCM and Nsx were contacted to discuss PRN meds. The patient seems to be very sensitive to his PRN meds and oversedation has been an issue with the patient having intermittent confusion and a decrease in respiratory drive. During my shift the PRN meds were decreased to achieve a better mental and respiratory status. Patient had been comfortable all day until this episode. New orders given. Will continue to monitor.

## 2015-10-05 NOTE — Procedures (Signed)
Intubation Procedure Note Rupert StacksGary L Brents [id number] 03-28-1945  Procedure: Intubation Indications: Respiratory insufficiency  Procedure Details Consent: Unable to obtain consent because of emergent medical necessity. Time Out: Verified patient identification, verified procedure, site/side was marked, verified correct patient position, special equipment/implants available, medications/allergies/relevent history reviewed, required imaging and test results available.  Performed  Maximum sterile technique was used including gloves and hand hygiene.  MAC and 4  Difficult airway >> very anterior.  Tried with laryngoscope, but difficulty to visualize.  Finally able to get airway with glidescope.  Inserted #8 ETT.  Confirmed with CO2 detector and auscultation.  Did not need to give any sedative medications.  Evaluation Hemodynamic Status: BP stable throughout; O2 sats: transiently fell during during procedure Patient's Current Condition: stable Complications: No apparent complications Patient did tolerate procedure well. Chest X-ray ordered to verify placement.  CXR: pending.   Coralyn HellingVineet Dawanda Mapel, MD [facility] 10/05/2015, 5:28 PM Pager:  [phone] After 3pm call: [phone]

## 2015-10-05 NOTE — Progress Notes (Signed)
Pt has become more comfortable and able to rest. Respiratory effort has been good and mental status has been good. Will continue to monitor.

## 2015-10-05 NOTE — Procedures (Signed)
Central Venous Catheter Insertion Procedure Note Rupert StacksGary L Pingree [id number] [birth year]  Procedure: Insertion of Central Venous Catheter Indications: Drug and/or fluid administration  Procedure Details Consent: Risks of procedure as well as the alternatives and risks of each were explained to the (patient/caregiver).  Consent for procedure obtained. Time Out: Verified patient identification, verified procedure, site/side was marked, verified correct patient position, special equipment/implants available, medications/allergies/relevent history reviewed, required imaging and test results available.  Performed  Maximum sterile technique was used including antiseptics, cap, gloves, gown, hand hygiene, mask and sheet. Skin prep: Chlorhexidine; local anesthetic administered A antimicrobial bonded/coated triple lumen catheter was placed in the left internal jugular vein using the Seldinger technique.  Placed with ultrasound guidance.  Evaluation Blood flow good Complications: No apparent complications Patient did tolerate procedure well. Chest X-ray ordered to verify placement.  CXR: pending.  Coralyn HellingVineet Abdulkareem Badolato, MD [facility] 10/05/2015, 6:07 PM Pager:  [phone] After 3pm call: [phone]

## 2015-10-05 NOTE — Progress Notes (Signed)
PULMONARY / CRITICAL CARE MEDICINE   Name: Danny Patterson MRN: [id number] DOB: 22-Apr-1945    ADMISSION DATE:  10/01/2015 CONSULTATION DATE:  10/01/2015  REFERRING MD:  Jeral Fruit   CHIEF COMPLAINT:  Hypoxia   SUBJECTIVE:  Altered mental status, hypoxia, hypotension.  VITAL SIGNS: BP 118/39 mmHg  Pulse 99  Temp(Src) 97.4 F (36.3 C) (Axillary)  Resp 11  Ht  (1.778 m)  Wt 240 lb 4.8 oz (109 kg)  BMI 34.48 kg/m2  SpO2 100%  HEMODYNAMICS:    VENTILATOR SETTINGS: Vent Mode:  [-] PRVC FiO2 (%):  [100 %] 100 % Set Rate:  [22 bmp] 22 bmp Vt Set:  [580 mL] 580 mL PEEP:  [5 cmH20] 5 cmH20 Plateau Pressure:  [14 cmH20] 14 cmH20  INTAKE / OUTPUT: I/O last 3 completed shifts: In: 4876 [P.O.:1920; I.V.:2956] Out: 4247 [Urine:4065; Drains:182]  PHYSICAL EXAMINATION: General: comatose, diaphoretic Neuro: no response to painful stimuli HEENT: secretions in posterior pharynx Cardiac: regular Chest: diminished breath sounds, scattered rhonchi Abd: soft, decreased bowel sounds Ext: no edema Skin: no rashes  LABS:  BMET  Recent Labs Lab 10/02/15 0601 10/03/15 0231 10/04/15 0302  NA 136 140 139  K 4.5 3.9 3.6  CL 107 106 104  CO2 19* 28 29  BUN CREATININE 1.07 0.94 0.85  GLUCOSE 126* 120* 130*    Electrolytes  Recent Labs Lab 10/02/15 0601 10/03/15 0231 10/04/15 0302  CALCIUM 7.9* 8.5* 8.6*    CBC  Recent Labs Lab 10/02/15 0601 10/03/15 0231 10/04/15 0302  WBC 9.6 8.6 9.5  HGB 8.7* 8.3* 8.1*  HCT 25.0* 25.0* 24.7*  PLT 73* 120* 121*    Coag's  Recent Labs Lab 10/02/15 1336  INR 1.08    Sepsis Markers No results for input(s): LATICACIDVEN, PROCALCITON, O2SATVEN in the last 168 hours.  ABG No results for input(s): PHART, PCO2ART, PO2ART in the last 168 hours.  Liver Enzymes No results for input(s): AST, ALT, ALKPHOS, BILITOT, ALBUMIN in the last 168 hours.  Cardiac Enzymes No results for input(s): TROPONINI, PROBNP in  the last 168 hours.  Glucose  Recent Labs Lab 10/04/15 2033 10/04/15 2356 10-22-15 0324 October 22, 2015 0819 10/22/15 1146 Oct 22, 2015 1611  GLUCAP 119* 120* 108* 105* 112* 156*    Imaging No results found.   STUDIES:  10-22-22 CT angio chest >> 10/22/2022 CT head >> 2022-10-22 Echo >>  CULTURES: October 22, 2022 Blood >> 10/22/22 Sputum >>   ANTIBIOTICS: 2022-10-22 Vancomycin >> October 22, 2022 Zosyn >>   SIGNIFICANT EVENTS: 7/12 OR for lumbar fusion  Oct 22, 2022 Altered mental status, shock, VDRF  LINES/TUBES:   DISCUSSION: 70yo male with hx HTN, DM, chronic back pain s/p lumbar fusion 7/12 with post op hypoventilation and hypoxia.  Developed altered mental status, respiratory failure, and shock 10/22/22.  ASSESSMENT / PLAN:  PULMONARY A: Acute hypoxic/hypercapnic respiratory failure. Presumed OSA  P:   Full vent support F/u CXR, ABG  CARDIOVASCULAR A: Shock > ?septic/cardiogenic. Hx HTN . P:  CVP monitoring Pressors to keep MAP > 65 F/u cortisol, lactic acid F/u cardiac enzymes, ECG, Echo Continue IV fluids Hold home norvasc, lasix, labetolol   RENAL A: Oliguria. P:   Place foley Monitor renal fx, urine outpt  GASTROINTESTINAL A: Nutrition. P:   NPO Protonix for SUP  HEMATOLOGIC A: Anemia after surgery. P:  F/u CBC SCDs  INFECTIOUS A: Concern for sepsis and aspiration/HCAP. P:   F/u lactic acid, procalcitonin Vancomycin, zosyn  ENDOCRINE A: DM    P:  Hold home metformin  SSI   NEUROLOGIC A: Acute metabolic encephalopathy. Extensive lumbar fusion 7/12. Pain management. P:   Hold sedatives/narcotics F/u CT head   Updated family at bedside.  D/w Dr. Jordan LikesPool.  CC time 65 minutes independent of procedure time.  Coralyn HellingVineet Tijuan Dantes, MD [facility] 10/05/2015, 6:17 PM Pager:  [phone] After 3pm call: [phone]

## 2015-10-05 NOTE — Progress Notes (Signed)
Patient with rapid onset of hypoxia and hypotension. Minimal respiratory effort. Intubated by CCM and resuscitation ongoing. Cause of condition unclear at this point. Plan CT scan of head and chest to rule out stroke and/or pulmonary embolus. Recheck labs and treat anemia as appropriate. Presumptively treat for sepsis for now. Situation discussed with family. Patient critically ill.

## 2015-10-05 NOTE — Progress Notes (Signed)
Patient had rapid onset of respiratory distress with sats into the 20's and was unresponsive. Prior to this event, the patient was maintaining adequate ventilation and neuro assessment was unchanged from previous assessments. Upon patient desatting, A NRB was attempted and then quickly switched to bagging the patient. Upon bagging the sats quickly returned to the 90's. RT was called and E-link was notified then responded initially via the in room camera. Nsx on call was also called and then arrived to the patient's room. CCM Md also arrived to the patient's room. The patient was then intubated.

## 2015-10-06 ENCOUNTER — Inpatient Hospital Stay (HOSPITAL_COMMUNITY): Payer: PPO

## 2015-10-06 DIAGNOSIS — J9690 Respiratory failure, unspecified, unspecified whether with hypoxia or hypercapnia: Secondary | ICD-10-CM | POA: Diagnosis not present

## 2015-10-06 DIAGNOSIS — R0600 Dyspnea, unspecified: Secondary | ICD-10-CM

## 2015-10-06 DIAGNOSIS — R570 Cardiogenic shock: Secondary | ICD-10-CM | POA: Diagnosis not present

## 2015-10-06 DIAGNOSIS — J9602 Acute respiratory failure with hypercapnia: Secondary | ICD-10-CM

## 2015-10-06 DIAGNOSIS — R7989 Other specified abnormal findings of blood chemistry: Secondary | ICD-10-CM | POA: Diagnosis not present

## 2015-10-06 DIAGNOSIS — Z4682 Encounter for fitting and adjustment of non-vascular catheter: Secondary | ICD-10-CM | POA: Diagnosis not present

## 2015-10-06 DIAGNOSIS — J189 Pneumonia, unspecified organism: Secondary | ICD-10-CM

## 2015-10-06 DIAGNOSIS — D649 Anemia, unspecified: Secondary | ICD-10-CM | POA: Diagnosis not present

## 2015-10-06 DIAGNOSIS — J9601 Acute respiratory failure with hypoxia: Secondary | ICD-10-CM | POA: Diagnosis not present

## 2015-10-06 DIAGNOSIS — A419 Sepsis, unspecified organism: Secondary | ICD-10-CM

## 2015-10-06 DIAGNOSIS — E876 Hypokalemia: Secondary | ICD-10-CM | POA: Diagnosis not present

## 2015-10-06 LAB — BLOOD GAS, ARTERIAL
ACID-BASE DEFICIT: 7.5 mmol/L — AB (ref 0.0–2.0)
BICARBONATE: 16.1 meq/L — AB (ref 20.0–24.0)
DRAWN BY: 24513
FIO2: 40
O2 SAT: 99.3 %
PATIENT TEMPERATURE: 98.6
PEEP/CPAP: 5 cmH2O
PH ART: 7.422 (ref 7.350–7.450)
RATE: 22 resp/min
TCO2: 16.9 mmol/L (ref 0–100)
VT: 580 mL
pCO2 arterial: 25.2 mmHg — ABNORMAL LOW (ref 35.0–45.0)
pO2, Arterial: 170 mmHg — ABNORMAL HIGH (ref 80.0–100.0)

## 2015-10-06 LAB — CBC
HCT: 27.1 % — ABNORMAL LOW (ref 39.0–52.0)
Hemoglobin: 8.9 g/dL — ABNORMAL LOW (ref 13.0–17.0)
MCH: 30.3 pg (ref 26.0–34.0)
MCHC: 32.8 g/dL (ref 30.0–36.0)
MCV: 92.2 fL (ref 78.0–100.0)
PLATELETS: 277 10*3/uL (ref 150–400)
RBC: 2.94 MIL/uL — ABNORMAL LOW (ref 4.22–5.81)
RDW: 13 % (ref 11.5–15.5)
WBC: 10.6 10*3/uL — ABNORMAL HIGH (ref 4.0–10.5)

## 2015-10-06 LAB — TROPONIN I
Troponin I: 0.14 ng/mL (ref <0.03)
Troponin I: 0.42 ng/mL (ref <0.03)

## 2015-10-06 LAB — BASIC METABOLIC PANEL
Anion gap: 11 (ref 5–15)
BUN: 14 mg/dL (ref 6–20)
CHLORIDE: 111 mmol/L (ref 101–111)
CO2: 17 mmol/L — AB (ref 22–32)
Calcium: 8.9 mg/dL (ref 8.9–10.3)
Creatinine, Ser: 1.04 mg/dL (ref 0.61–1.24)
GFR calc Af Amer: >60 mL/min (ref >60)
GFR calc non Af Amer: >60 mL/min (ref >60)
Glucose, Bld: 137 mg/dL — ABNORMAL HIGH (ref 65–99)
POTASSIUM: 3.3 mmol/L — AB (ref 3.5–5.1)
SODIUM: 139 mmol/L (ref 135–145)

## 2015-10-06 LAB — MAGNESIUM
MAGNESIUM: 1.7 mg/dL (ref 1.7–2.4)
Magnesium: 1.7 mg/dL (ref 1.7–2.4)

## 2015-10-06 LAB — GLUCOSE, CAPILLARY
GLUCOSE-CAPILLARY: 173 mg/dL — AB (ref 65–99)
GLUCOSE-CAPILLARY: 161 mg/dL — AB (ref 65–99)
Glucose-Capillary: 137 mg/dL — ABNORMAL HIGH (ref 65–99)
Glucose-Capillary: 176 mg/dL — ABNORMAL HIGH (ref 65–99)
Glucose-Capillary: 203 mg/dL — ABNORMAL HIGH (ref 65–99)

## 2015-10-06 LAB — ECHOCARDIOGRAM COMPLETE
HEIGHTINCHES: 70 in
Weight: 3689.62 oz

## 2015-10-06 LAB — PHOSPHORUS
Phosphorus: 3.1 mg/dL (ref 2.5–4.6)
Phosphorus: 2.5 mg/dL (ref 2.5–4.6)

## 2015-10-06 LAB — PROCALCITONIN: Procalcitonin: 0.13 ng/mL

## 2015-10-06 MED ORDER — MIDAZOLAM HCL 2 MG/2ML IJ SOLN
INTRAMUSCULAR | Status: AC
  Administered 2015-10-06: 2 mg via INTRAVENOUS
  Filled 2015-10-06: qty 2

## 2015-10-06 MED ORDER — POTASSIUM CHLORIDE 10 MEQ/50ML IV SOLN
10.0000 meq | INTRAVENOUS | Status: AC
  Administered 2015-10-06 (×4): 10 meq via INTRAVENOUS
  Filled 2015-10-06 (×4): qty 50

## 2015-10-06 MED ORDER — VITAL HIGH PROTEIN PO LIQD
1000.0000 mL | ORAL | Status: DC
  Administered 2015-10-06 (×2)
  Administered 2015-10-06: 1000 mL
  Administered 2015-10-07: 20:00:00
  Administered 2015-10-07: 1000 mL
  Administered 2015-10-07 – 2015-10-08 (×2)

## 2015-10-06 MED ORDER — MIDAZOLAM HCL 2 MG/2ML IJ SOLN
1.0000 mg | INTRAMUSCULAR | Status: DC | PRN
  Administered 2015-10-06 (×2): 2 mg via INTRAVENOUS
  Administered 2015-10-06 – 2015-10-07 (×3): 4 mg via INTRAVENOUS
  Administered 2015-10-07 – 2015-10-08 (×3): 2 mg via INTRAVENOUS
  Administered 2015-10-08: 4 mg via INTRAVENOUS
  Administered 2015-10-08: 2 mg via INTRAVENOUS
  Administered 2015-10-08 (×2): 4 mg via INTRAVENOUS
  Administered 2015-10-08: 3 mg via INTRAVENOUS
  Administered 2015-10-08: 2 mg via INTRAVENOUS
  Administered 2015-10-08: 4 mg via INTRAVENOUS
  Administered 2015-10-08 (×3): 2 mg via INTRAVENOUS
  Administered 2015-10-09: 4 mg via INTRAVENOUS
  Filled 2015-10-06: qty 4
  Filled 2015-10-06 (×2): qty 2
  Filled 2015-10-06 (×2): qty 4
  Filled 2015-10-06: qty 2
  Filled 2015-10-06 (×2): qty 4
  Filled 2015-10-06 (×3): qty 2
  Filled 2015-10-06: qty 4
  Filled 2015-10-06: qty 2
  Filled 2015-10-06 (×4): qty 4

## 2015-10-06 MED ORDER — PRO-STAT SUGAR FREE PO LIQD
30.0000 mL | Freq: Every day | ORAL | Status: DC
  Administered 2015-10-06 – 2015-10-07 (×7): 30 mL
  Filled 2015-10-06 (×8): qty 30

## 2015-10-06 MED ORDER — FENTANYL CITRATE (PF) 100 MCG/2ML IJ SOLN
25.0000 ug | INTRAMUSCULAR | Status: DC | PRN
Start: 2015-10-06 — End: 2015-10-09
  Administered 2015-10-06 – 2015-10-09 (×18): 75 ug via INTRAVENOUS
  Filled 2015-10-06 (×19): qty 2

## 2015-10-06 MED ORDER — PRO-STAT SUGAR FREE PO LIQD: 30.0000 mL | Freq: Two times a day (BID) | ORAL | Status: DC

## 2015-10-06 NOTE — Progress Notes (Signed)
Patient ID: Danny StacksGary L Bouwens, male   DOB: Dec 28, 1945, 70 y.o.   MRN: [id number] Penni HomansSatble, movesl 4 extremities. olan hob uo in am. Dressing dry

## 2015-10-06 NOTE — Progress Notes (Signed)
PULMONARY / CRITICAL CARE MEDICINE   Name: Danny StacksGary L Spees MRN: [id number] DOB: [birth year]    ADMISSION DATE:  10/01/2015 CONSULTATION DATE:  10/01/2015  REFERRING MD:  Jeral FruitBotero   CHIEF COMPLAINT:  Hypoxia   SUBJECTIVE:  Intubated late yesterday for hypoxia, AMS, hypotension.  This am is agitated intermittently, asking for more pain medication.  Family anxious.  Remains on Levophed 35mcg.   REVIEW OF SYSTEMS: Unable to obtain given intubation and sedation.  VITAL SIGNS: BP 125/48 mmHg  Pulse 73  Temp(Src) 100.6 F (38.1 C) (Axillary)  Resp 17  Ht 5\' 10"  (1.778 m)  Wt 109 kg (240 lb 4.8 oz)  BMI 34.48 kg/m2  SpO2 100%  HEMODYNAMICS: CVP:  [5 mmHg-15 mmHg] 8 mmHg  VENTILATOR SETTINGS: Vent Mode:  [-] PSV;CPAP FiO2 (%):  [40 %-100 %] 40 % Set Rate:  [22 bmp] 22 bmp Vt Set:  [580 mL] 580 mL PEEP:  [5 cmH20] 5 cmH20 Pressure Support:  [5 cmH20-10 cmH20] 10 cmH20 Plateau Pressure:  [14 cmH20-19 cmH20] 14 cmH20  INTAKE / OUTPUT: I/O last 3 completed shifts: In: 4200.2 [P.O.:360; I.V.:3690.2; IV Piggyback:150] Out: 4098 [JXBJY:[id number] [Urine:6365; Drains:67]  PHYSICAL EXAMINATION: General: chronically ill appearing male, NAD  Neuro: RASS -1 currently although intermittently agitated with RASS+3   HEENT: ETT, mm moist  Cardiac: regular Chest: resps even non labored on vent, diminished breath sounds, scattered rhonchi Abd: soft, decreased bowel sounds Ext: no edema Skin: no rashes  LABS:  BMET  Recent Labs Lab 10/04/15 0302 10/05/15 1610 10/06/15 0446  NA 139 139 139  K 3.6 4.0 3.3*  CL 104 109 111  CO2 29 22 17*  BUN 7 13 14   CREATININE 0.85 1.12 1.04  GLUCOSE 130* 153* 137*    Electrolytes  Recent Labs Lab 10/04/15 0302 10/05/15 1610 10/06/15 0446  CALCIUM 8.6* 8.5* 8.9    CBC  Recent Labs Lab 10/04/15 0302 10/05/15 1610 10/06/15 0446  WBC 9.5 7.0 10.6*  HGB 8.1* 7.4* 8.9*  HCT 24.7* 22.7* 27.1*  PLT 121* 150 277    Coag's  Recent Labs Lab  10/02/15 1336  INR 1.08    Sepsis Markers  Recent Labs Lab 10/05/15 1610 10/05/15 1700 10/06/15 0446  LATICACIDVEN  --  0.7  --   PROCALCITON <0.10  --  0.13    ABG  Recent Labs Lab 10/05/15 1810 10/06/15 0435  PHART 7.308* 7.422  PCO2ART 43.8 25.2*  PO2ART 250* 170*    Liver Enzymes  Recent Labs Lab 10/05/15 1610  AST 22  ALT 14*  ALKPHOS 39  BILITOT 0.7  ALBUMIN 2.4*    Cardiac Enzymes  Recent Labs Lab 10/05/15 1610 10/06/15 0021 10/06/15 0446  TROPONINI 0.05* 0.14* 0.42*    Glucose  Recent Labs Lab 10/05/15 1146 10/05/15 1611 10/05/15 1953 10/05/15 2339 10/06/15 0420 10/06/15 0822  GLUCAP 112* 156* 165* 163* 137* 176*    Imaging Ct Head Wo Contrast  10/06/2015  CLINICAL DATA:  Altered mental status.  Hypotension. EXAM: CT HEAD WITHOUT CONTRAST TECHNIQUE: Contiguous axial images were obtained from the base of the skull through the vertex without intravenous contrast. COMPARISON:  None. FINDINGS: Brain: No evidence of acute infarction, hemorrhage, hydrocephalus, or mass lesion/mass effect. Vascular:  Atherosclerotic calcification. Skull: Subgaleal fluid centered on the vertex without calvarial fracture or focal lesion. Asymmetric advanced right TMJ osteoarthritis. Sinuses/Orbits: Diffuse mucosal thickening in the paranasal sinuses with bilateral sphenoid fluid level, nonspecific in the setting of intubation. Other: None. IMPRESSION: 1. No acute  intracranial finding. 2. Nonspecific subgaleal fluid. No associated fracture or calvarial lesion. 3. Sphenoid sinus fluid levels. Electronically Signed   By: Marnee Spring M.D.   On: 10/06/2015 00:34   Ct Angio Chest Pe W Or Wo Contrast  10/06/2015  CLINICAL DATA:  Hypoxia and hypotension. EXAM: CT ANGIOGRAPHY CHEST WITH CONTRAST TECHNIQUE: Multidetector CT imaging of the chest was performed using the standard protocol during bolus administration of intravenous contrast. Multiplanar CT image reconstructions  and MIPs were obtained to evaluate the vascular anatomy. CONTRAST:  80 cc Isovue 370 intravenous COMPARISON:  None available (03/25/1999) FINDINGS: Cardiovascular: Suspect left ventricular hypertrophy. No pericardial effusion. Coronary atherosclerotic calcification. No evidence of pulmonary embolism or acute aortic syndrome. Mediastinum: Endotracheal tube with tip above the carina. Left IJ central line with tip at distal left brachiocephalic vein. Lungs/Pleura: Small bilateral layering pleural effusion. Dependent atelectasis, moderate. There is no edema, consolidation, or pneumothorax. Upper abdomen: No acute findings. Musculoskeletal: No chest wall mass or suspicious bone lesions identified. 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 Partly seen catheter in the spinal canal. Review of the MIP images confirms the above findings. IMPRESSION: 1. No evidence of pulmonary embolism. 2. Moderate lower lobe atelectasis with small layering pleural effusions. Electronically Signed   By: Marnee Spring M.D.   On: 10/06/2015 00:29   Dg Chest Port 1 View  10/06/2015  CLINICAL DATA:  Intubation.  Respiratory failure . EXAM: PORTABLE CHEST 1 VIEW COMPARISON:  CT 10/05/2015.  Chest x-ray 10/05/2015. FINDINGS: Endotracheal tube, left IJ line in stable position. Metallic density noted left chest. Heart size stable. Normal pulmonary vascularity. Mild bibasilar subsegmental atelectasis. Small left pleural effusion cannot be excluded. No pneumothorax . IMPRESSION: 1. Lines and tubes in stable position 2. Low lung volumes with mild bibasilar atelectasis. Electronically Signed   By: Maisie Fus  Register   On: 10/06/2015 07:27   Dg Chest Port 1 View  10/05/2015  CLINICAL DATA:  Central line placement EXAM: PORTABLE CHEST 1 VIEW COMPARISON:  10/05/2015 FINDINGS: Endotracheal tube with the tip 5.2 cm above the carina. Interval placement of a left jugular central venous catheter with the tip projecting  over the SVC. There is mild bibasilar atelectasis. There is no pleural effusion or pneumothorax. The heart and mediastinal contours are unremarkable. The osseous structures are unremarkable. IMPRESSION: Endotracheal tube with the tip 5.2 cm above the carina. Interval placement of a left jugular central venous catheter with the tip projecting over the SVC. Electronically Signed   By: Elige Ko   On: 10/05/2015 18:53   Dg Chest Port 1 View  10/05/2015  CLINICAL DATA:  Respiratory failure.  Endotracheal tube present. EXAM: PORTABLE CHEST 1 VIEW COMPARISON:  10/01/2015 FINDINGS: Endotracheal tube with the tip 4.3 cm above the carina. There is no focal parenchymal opacity. There is no pleural effusion or pneumothorax. The heart and mediastinal contours are unremarkable. The osseous structures are unremarkable. IMPRESSION: Endotracheal tube with the tip 4.3 cm above the carina. Electronically Signed   By: Elige Ko   On: 10/05/2015 18:10     STUDIES:  CTA Chest 7/16: 1. No evidence of pulmonary embolism.  2. Moderate lower lobe atelectasis with small layering pleural effusions. CT Head W/O 7/16:  No acute abnormality. TTE 7/16>>> Port CXR 7/17:  ETT in good position. Questionable left basilar opacity.   MICROBIOLOGY: MRSA PCR 7/5:  Negative Blood Ctx x2 7/16>>> Tracheal Asp Ctx 7/16>>>  ANTIBIOTICS: Vancomycin 7/16>>> Zosyn 7/16>>>  SIGNIFICANT EVENTS: 7/12 OR for  lumbar fusion  7/16 Altered mental status, shock, VDRF  LINES/TUBES: OETT 8.0 7/16>>> L IJ CVL 7/16>>> FOLEY 7/12>>> LUMBAR DRAIN 7/12>>> PIV x1  DISCUSSION: 70yo male with hx HTN, DM, chronic back pain s/p lumbar fusion 7/12 with post op hypoventilation and hypoxia.  Developed altered mental status, respiratory failure, and shock 7/16.  ASSESSMENT / PLAN:  PULMONARY A: Acute Hypoxic Respiratory Failure - Aspiration Pneumonia vs HCAP. Acute Hypercarbic Respiratory Failure - Secondary to oversedation in setting of  OSA. Presumed OSA HCAP vs Aspiration Pneumonia  P:   Continue PS wean Intermittent CXR & ABG SBT in am   CARDIOVASCULAR A: Shock - Septic vs Cardiogenic. Elevated Troponin I - Possible demand ischemia. H/O HTN H/O Hyperlipidemia  P:  Vitals per unit protocol Continuous Monitoring on telemetry CVP monitoring Levophed to keep MAP > 65 Trending Troponin I TTE pending Cardiology consult if new wall motion abnormality on TTE Hold home norvasc, lasix, labetolol   RENAL A: Oliguria - Improving. Hypokalemia - Mild. Replaced.  P:   Trending UOP with Foley Monitoring electrolytes & renal function daily  Replacing electrolytes as indicated KCl IV x4 runs  GASTROINTESTINAL A: No acute issues.  P:   Core Trak feeding tube Starting Tube Feedings Protonix VT daily  HEMATOLOGIC A: Anemia - No signs of active bleeding. Mild. Leukocytosis - Mild. Likely reactive.  P:  Trending cell counts daily w/ CBC SCDs  INFECTIOUS A: Possible Aspiration Pneumonia vs HCAP  P:   Empiric Vancomycin & Zosyn Day #2 Trending PCT per algorithm Awaiting Tracheal Aspirate Culture  ENDOCRINE A: H/O DM Type 2  P:   Hold home metformin  Accu-checks q4hr SSI per Moderate Algorithm   NEUROLOGIC A: Pain Control Post-op - Extensive lumbar fusion 7/12. Sedation on Ventilator Acute Encephalopathy - Metabolic vs hypercarbia w/ excessive sedation.  P:   Desired RASS:  0 to -1 Fentanyl IV prn Versed IV prn  Dirk Dress, NP 10/06/2015  11:06 AM Pager: [phone] or [phone]  PCCM Attending Note: Patient seen and examined with nurse practitioner. Please refer to her progress note which I reviewed in detail. Patient continues to be in a state of shock which is unclear in etiology. Suspect possible cardiogenic source. Awaiting transthoracic echocardiogram result. Continuing broad-spectrum antibiotics for now. Continuing pressure support wean. Consider extubation  once patient is able to maintain airway and passion spontaneous breathing trial. Patient's family updated at length by myself this morning.  I have spent a total of 36 minutes of critical care time today caring for the patient, updating family at bedside, and discussing plan of care with nursing staff, and reviewing the patient's electronic medical record.  Donna Christen Jamison Neighbor, M.D. [facility] Pulmonary & Critical Care Pager:  [phone] After 3pm or if no response, call [phone] 11:19 AM 10/06/2015

## 2015-10-06 NOTE — Care Management Important Message (Signed)
Important Message  Patient Details  Name: Danny StacksGary L Meulemans MRN: [id number] Date of Birth: [birth year]   Medicare Important Message Given:  Yes    Kyla BalzarineShealy, Calle Schader Abena 10/06/2015, 3:09 PM

## 2015-10-06 NOTE — Progress Notes (Signed)
CRITICAL VALUE ALERT  Critical value received:  Troponin: 0.05  Date of notification:  10/05/15  Time of notification: ~1920  Critical value read back:Yes.    Nurse who received alert:  Fransisco HertzJones, Madason Rauls D, RN   MD notified (1st page):  Vinnie LangtonGretchen, RN with [facility]  Time of first page:  1920  MD notified (2nd page):  Time of second page:  Responding MD:    Time MD responded:   Also discussed with RN about pt's new bradycardia and ventricular rhythm. At current time, pt had a long run of PVCs, HR staying at 50bpm. Discussed holding on going to CT head and CT chest until pt stabilizes.

## 2015-10-06 NOTE — Progress Notes (Signed)
OT Cancellation Note  Patient Details Name: Rupert StacksGary L Jeune MRN: [id number] DOB: [birth year]   Cancelled Treatment:    Reason Eval/Treat Not Completed: Patient not medically ready (vent sedated) Spoke with RN and will hold at this date and time. OT will check back 10/07/15 and requesting PT orders .   Felecia ShellingJones, Bryson Palen B   Samanthajo Payano, Brynn   OTR/L Pager: [phone] Office: [phone] .  10/06/2015, 8:09 AM

## 2015-10-06 NOTE — Progress Notes (Signed)
Echocardiogram 2D Echocardiogram has been performed.  Danny Patterson, Danny Patterson 10/06/2015, 3:00 PM

## 2015-10-06 NOTE — Progress Notes (Signed)
Patient ID: Danny StacksGary L Stickler, male   DOB: 01-02-1946, 70 y.o.   MRN: [id number] Intubated over the weekend secondary to poor respiratory efforts. Able to follow commands, moves all 4 extremities. Continue lp drain till he is off the respirator. Spoke with family

## 2015-10-06 NOTE — Progress Notes (Signed)
Initial Nutrition Assessment  DOCUMENTATION CODES:   Obesity unspecified  INTERVENTION:   Vital High Protein @ 40 ml/hr 30 ml Prostat five times per day Provides: 960 ml, 1460 kcal, 159 grams protein, and 802 ml H2O.    NUTRITION DIAGNOSIS:   Inadequate oral intake related to inability to eat as evidenced by NPO status.  GOAL:   Provide needs based on ASPEN/SCCM guidelines  MONITOR:   TF tolerance, I & O's, Labs, Vent status  REASON FOR ASSESSMENT:   Consult Enteral/tube feeding initiation and management  ASSESSMENT:   70yo male with hx HTN, DM, chronic back pain s/p lumbar fusion 7/12 with post op hypoventilation and hypoxia. Developed altered mental status, respiratory failure, and shock 7/16.  Patient is currently intubated on ventilator support  Temp (24hrs), Avg:98.8 F (37.1 C), Min:97.2 F (36.2 C), Max:100.6 F (38.1 C)  Medications reviewed and include: KCl, levophed Labs reviewed: PO4 and magnesium are WNL, K+ 3.3 CBG"s: 173-176 Nutrition-Focused physical exam completed. Findings are no fat depletion, no muscle depletion, and no edema.  Pt discussed during ICU rounds and with RN.  7/17 Cortrak placed, tip in descending duodenum   Diet Order:  Diet NPO time specified  Skin:  Reviewed, no issues (back incision)  Last BM:  7/12  Height:   Ht Readings from Last 1 Encounters:  10/06/15 5\' 10"  (1.778 m)    Weight:   Wt Readings from Last 1 Encounters:  10/06/15 230 lb 9.6 oz (104.6 kg)    Ideal Body Weight:  75.4 kg  BMI:  Body mass index is 33.09 kg/(m^2).  Estimated Nutritional Needs:   Kcal:  [id number]  Protein:  >/= 150 grams  Fluid:  > 1.8 L/day  EDUCATION NEEDS:   No education needs identified at this time  Kendell BaneHeather Avaya Mcjunkins RD, LDN, CNSC [phone] Pager [phone] After Hours Pager

## 2015-10-07 ENCOUNTER — Other Ambulatory Visit (HOSPITAL_COMMUNITY): Payer: PPO

## 2015-10-07 DIAGNOSIS — R570 Cardiogenic shock: Secondary | ICD-10-CM | POA: Diagnosis not present

## 2015-10-07 DIAGNOSIS — D649 Anemia, unspecified: Secondary | ICD-10-CM | POA: Diagnosis not present

## 2015-10-07 DIAGNOSIS — J9601 Acute respiratory failure with hypoxia: Secondary | ICD-10-CM | POA: Diagnosis not present

## 2015-10-07 DIAGNOSIS — J189 Pneumonia, unspecified organism: Secondary | ICD-10-CM | POA: Diagnosis not present

## 2015-10-07 DIAGNOSIS — R7989 Other specified abnormal findings of blood chemistry: Secondary | ICD-10-CM | POA: Diagnosis not present

## 2015-10-07 DIAGNOSIS — E876 Hypokalemia: Secondary | ICD-10-CM | POA: Diagnosis not present

## 2015-10-07 LAB — RENAL FUNCTION PANEL
ALBUMIN: 2.3 g/dL — AB (ref 3.5–5.0)
Anion gap: 9 (ref 5–15)
BUN: 15 mg/dL (ref 6–20)
CHLORIDE: 112 mmol/L — AB (ref 101–111)
CO2: 20 mmol/L — ABNORMAL LOW (ref 22–32)
CREATININE: 0.77 mg/dL (ref 0.61–1.24)
Calcium: 8.5 mg/dL — ABNORMAL LOW (ref 8.9–10.3)
GFR calc Af Amer: >60 mL/min (ref >60)
GLUCOSE: 163 mg/dL — AB (ref 65–99)
POTASSIUM: 3.1 mmol/L — AB (ref 3.5–5.1)
Phosphorus: 1.5 mg/dL — ABNORMAL LOW (ref 2.5–4.6)
Sodium: 141 mmol/L (ref 135–145)

## 2015-10-07 LAB — MAGNESIUM
MAGNESIUM: 1.6 mg/dL — AB (ref 1.7–2.4)
Magnesium: 1.9 mg/dL (ref 1.7–2.4)

## 2015-10-07 LAB — GLUCOSE, CAPILLARY
GLUCOSE-CAPILLARY: 138 mg/dL — AB (ref 65–99)
GLUCOSE-CAPILLARY: 167 mg/dL — AB (ref 65–99)
GLUCOSE-CAPILLARY: 179 mg/dL — AB (ref 65–99)
Glucose-Capillary: 165 mg/dL — ABNORMAL HIGH (ref 65–99)
Glucose-Capillary: 162 mg/dL — ABNORMAL HIGH (ref 65–99)
Glucose-Capillary: 168 mg/dL — ABNORMAL HIGH (ref 65–99)
Glucose-Capillary: 165 mg/dL — ABNORMAL HIGH (ref 65–99)

## 2015-10-07 LAB — CULTURE, RESPIRATORY W GRAM STAIN: Culture: NORMAL

## 2015-10-07 LAB — PHOSPHORUS
PHOSPHORUS: 2.6 mg/dL (ref 2.5–4.6)
PHOSPHORUS: 2.9 mg/dL (ref 2.5–4.6)

## 2015-10-07 LAB — CULTURE, RESPIRATORY

## 2015-10-07 LAB — PROCALCITONIN: Procalcitonin: 0.11 ng/mL

## 2015-10-07 LAB — TROPONIN I: TROPONIN I: 0.23 ng/mL — AB (ref <0.03)

## 2015-10-07 MED ORDER — MAGNESIUM SULFATE 2 GM/50ML IV SOLN
2.0000 g | Freq: Once | INTRAVENOUS | Status: AC
  Administered 2015-10-07: 2 g via INTRAVENOUS
  Filled 2015-10-07: qty 50

## 2015-10-07 MED ORDER — SODIUM PHOSPHATES 45 MMOLE/15ML IV SOLN
30.0000 mmol | Freq: Once | INTRAVENOUS | Status: AC
  Administered 2015-10-07: 30 mmol via INTRAVENOUS
  Filled 2015-10-07: qty 10

## 2015-10-07 MED ORDER — ANTISEPTIC ORAL RINSE SOLUTION (CORINZ)
7.0000 mL | OROMUCOSAL | Status: DC
  Administered 2015-10-07 – 2015-10-10 (×21): 7 mL via OROMUCOSAL

## 2015-10-07 MED ORDER — POTASSIUM CHLORIDE 20 MEQ/15ML (10%) PO SOLN
30.0000 meq | ORAL | Status: AC
  Administered 2015-10-07 (×2): 30 meq
  Filled 2015-10-07 (×2): qty 30

## 2015-10-07 MED ORDER — INSULIN ASPART 100 UNIT/ML ~~LOC~~ SOLN: 0.0000 [IU] | SUBCUTANEOUS | Status: DC

## 2015-10-07 MED ORDER — INSULIN ASPART 100 UNIT/ML ~~LOC~~ SOLN
0.0000 [IU] | SUBCUTANEOUS | Status: DC
  Administered 2015-10-07 (×3): 4 [IU] via SUBCUTANEOUS
  Administered 2015-10-08 – 2015-10-09 (×8): 3 [IU] via SUBCUTANEOUS

## 2015-10-07 MED ORDER — CHLORHEXIDINE GLUCONATE 0.12% ORAL RINSE (MEDLINE KIT)
15.0000 mL | Freq: Two times a day (BID) | OROMUCOSAL | Status: DC
  Administered 2015-10-07 – 2015-10-10 (×5): 15 mL via OROMUCOSAL

## 2015-10-07 NOTE — Progress Notes (Signed)
   10/07/15 1330  Clinical Encounter Type  Visited With Patient and family together  Visit Type Initial  Spiritual Encounters  Spiritual Needs Emotional;Prayer  Chaplain on afternoon rounds visited with patient's son.  Chaplain prayed for patient and then prayed for son and family.  Chaplain made further support available as needed.

## 2015-10-07 NOTE — Progress Notes (Signed)
[facility] FOR AM LAB REPLACEMENT ONLY  The patient does [facility] the [facility] Protocol based on the criteria listed below:   1. Is GFR >/= 40 ml/min? Yes.    Patient's GFR today is >60 2. Is urine output >/= 0.5 ml/kg/hr for the last 6 hours? Yes.   Patient's UOP is 1.39 ml/kg/hr 3. Is BUN < 60 mg/dL? Yes.    Patient's BUN today is 15 4. Abnormal electrolyte(s):  K - 3.1, Phos - 1.5, Mg - 1.6 5. Ordered repletion with: PER PROTOCOL 6. If a panic level lab has been reported, has the CCM MD in charge been notified? Yes.  .   Physician:  Dr. Minerva FesterMcQuaid  Yizel Canby C Edan Serratore 10/07/2015 6:01 AM

## 2015-10-07 NOTE — Progress Notes (Signed)
Patient ID: Danny StacksGary L Patterson, male   DOB: 19-Aug-1945, 70 y.o.   MRN: [id number] Moves all 4 extremities. Wound dry. lp drain working . Intubated . Waiting for a TEE

## 2015-10-07 NOTE — Anesthesia Postprocedure Evaluation (Signed)
Anesthesia Post Note  Patient: Danny Patterson  Procedure(s) Performed: Procedure(s) (LRB): Lumbar three-four,  Lumbar four-five,  Lumbar five-Sacrum one posterior lumbar interbody fusion with Laminotomy at Lumbar Two-Three (N/A)  Patient location during evaluation: PACU Anesthesia Type: General Level of consciousness: awake and alert, awake and sedated Pain management: pain level controlled Vital Signs Assessment: post-procedure vital signs reviewed and stable Respiratory status: spontaneous breathing, nonlabored ventilation, respiratory function stable and patient connected to nasal cannula oxygen Cardiovascular status: blood pressure returned to baseline and stable Postop Assessment: no signs of nausea or vomiting Anesthetic complications: no    Last Vitals:  Filed Vitals:   10/07/15 1200 10/07/15 1230  BP: 100/51 97/55  Pulse: 77 78  Temp: 37.6 C   Resp: 15 22    Last Pain:  Filed Vitals:   10/07/15 1244  PainSc: Asleep                 Yosgar Demirjian,JAMES TERRILL

## 2015-10-07 NOTE — Progress Notes (Signed)
PULMONARY / CRITICAL CARE MEDICINE   Name: Danny Patterson MRN: [id number] DOB: 1945/04/17    ADMISSION DATE:  10/01/2015 CONSULTATION DATE:  10/01/2015  REFERRING MD:  Jeral Fruit   CHIEF COMPLAINT:  Hypoxia   SUBJECTIVE:  Intubated late yesterday for hypoxia, AMS, hypotension.  This am is agitated intermittently, asking for more pain medication.  Family anxious.  Remains on Levophed / min.   REVIEW OF SYSTEMS: Unable to obtain given intubation and sedation.  VITAL SIGNS: BP 116/53 mmHg  Pulse 84  Temp(Src) 100.5 F (38.1 C) (Axillary)  Resp 17  Ht 5\' 10"  (1.778 m)  Wt 223 lb 15.8 oz (101.6 kg)  BMI 32.14 kg/m2  SpO2 100%  HEMODYNAMICS: CVP:  [5 mmHg-9 mmHg] 6 mmHg  VENTILATOR SETTINGS: Vent Mode:  [-] PSV;CPAP FiO2 (%):  [40 %] 40 % Set Rate:  [22 bmp] 22 bmp Vt Set:  [580 mL] 580 mL PEEP:  [5 cmH20] 5 cmH20 Pressure Support:  [10 cmH20] 10 cmH20 Plateau Pressure:  [13 cmH20-15 cmH20] 15 cmH20  INTAKE / OUTPUT: I/O last 3 completed shifts: In: 5608.2 [I.V.:4382.9; NG/GT:575.3; IV Piggyback:650] Out: 7218 [Urine:7200; Drains:18]  PHYSICAL EXAMINATION: General: chronically ill appearing male,intubated, NAD  Neuro: RASS -1 currently although intermittently agitated with RASS+2   HEENT: ETT, mm moist  Cardiac: regular Chest: resps even non labored on vent, diminished breath sounds, scattered rhonchi Abd: soft, decreased bowel sounds Ext: no edema Skin: no rashes  LABS:  BMET  Recent Labs Lab 10/05/15 1610 10/06/15 0446 10/07/15 0458  NA 139 139 141  K 4.0 3.3* 3.1*  CL 109 111 112*  CO2 22 17* 20*  BUN 13 14 15   CREATININE 1.12 1.04 0.77  GLUCOSE 153* 137* 163*    Electrolytes  Recent Labs Lab 10/05/15 1610 10/06/15 0446 10/06/15 1135 10/06/15 1718 10/07/15 0458  CALCIUM 8.5* 8.9  --   --  8.5*  MG  --   --  1.7 1.7 1.6*  PHOS  --   --  3.1 2.5 1.5*    CBC  Recent Labs Lab 10/04/15 0302 10/05/15 1610 10/06/15 0446  WBC 9.5 7.0  10.6*  HGB 8.1* 7.4* 8.9*  HCT 24.7* 22.7* 27.1*  PLT 121* 150 277    Coag's  Recent Labs Lab 10/02/15 1336  INR 1.08    Sepsis Markers  Recent Labs Lab 10/05/15 1610 10/05/15 1700 10/06/15 0446 10/07/15 0458  LATICACIDVEN  --  0.7  --   --   PROCALCITON <0.10  --  0.13 0.11    ABG  Recent Labs Lab 10/05/15 1810 10/06/15 0435  PHART 7.308* 7.422  PCO2ART 43.8 25.2*  PO2ART 250* 170*    Liver Enzymes  Recent Labs Lab 10/05/15 1610 10/07/15 0458  AST 22  --   ALT 14*  --   ALKPHOS 39  --   BILITOT 0.7  --   ALBUMIN 2.4* 2.3*    Cardiac Enzymes  Recent Labs Lab 10/05/15 1610 10/06/15 0021 10/06/15 0446  TROPONINI 0.05* 0.14* 0.42*    Glucose  Recent Labs Lab 10/06/15 1220 10/06/15 1546 10/06/15 2000 10/06/15 2337 10/07/15 0342 10/07/15 0744  GLUCAP 173* 161* 203* 179* 167* 165*    Imaging Dg Abd Portable 1v  10/06/2015  CLINICAL DATA:  Status post feeding tube placement. EXAM: PORTABLE ABDOMEN - 1 VIEW COMPARISON:  None. FINDINGS: Feeding tube is in place with the tip in the descending duodenum. The bowel gas pattern is unremarkable. IMPRESSION: As above. Electronically Signed  By: Drusilla Kanner M.D.   On: 10/06/2015 13:58     STUDIES:  CTA Chest 7/16: 1. No evidence of pulmonary embolism.  2. Moderate lower lobe atelectasis with small layering pleural effusions. CT Head W/O 7/16:  No acute abnormality. TTE 7/16>>> Port CXR 7/17:  ETT in good position. Questionable left basilar opacity.   MICROBIOLOGY: MRSA PCR 7/5:  Negative Blood Ctx x2 7/16>>> Tracheal Asp Ctx 7/16>>>  ANTIBIOTICS: Vancomycin 7/16>>>10/07/15 Zosyn 7/16>>>  SIGNIFICANT EVENTS: 7/12 OR for lumbar fusion  7/16 Altered mental status, shock, VDRF  LINES/TUBES: OETT 8.0 7/16>>> L IJ CVL 7/16>>> FOLEY 7/12>>> LUMBAR DRAIN 7/12>>> PIV x1  DISCUSSION: 70yo male with hx HTN, DM, chronic back pain s/p lumbar fusion 7/12 with post op hypoventilation and  hypoxia.  Developed altered mental status, respiratory failure, and shock 7/16.  ASSESSMENT / PLAN:  PULMONARY A: Acute Hypoxic Respiratory Failure - Aspiration Pneumonia vs HCAP. Acute Hypercarbic Respiratory Failure - Secondary to oversedation in setting of OSA. Presumed OSA HCAP vs Aspiration Pneumonia  P:   Continue PS wean Intermittent CXR & ABG SBT Daily  Goal Sats >93%  CARDIOVASCULAR A: Shock - Septic vs Cardiogenic. Improving. CVP=6 10/07/15. Elevated Troponin I - Possible demand ischemia. H/O HTN H/O Hyperlipidemia Last Troponin 0.42 : up trending  P:  Vitals per unit protocol Continuous Monitoring on telemetry CVP monitoring q4hr Levophed to keep MAP > 65 Continue trending Troponin I TTE poor diagnostic study, will order  TEE  Cardiology consult if new wall motion abnormality on TEE Hold home norvasc, lasix, labetolol   RENAL A: Hypophosphatemia - Replaced. Hypomagnesemia - Replaced.  Oliguria - Improving. Hypokalemia - Mild. Replaced.  P:   Trending UOP with Foley Monitoring electrolytes & renal function daily  Replacing electrolytes as indicated K Phos 30 mmol repletion Magnesium 2 gram repletion Potassium 30 mEq x 2 doses per tube  GASTROINTESTINAL A: No acute issues.  P:   Holding Tube Feedings in anticipation of TEE Protonix VT daily  HEMATOLOGIC A: Anemia - No signs of active bleeding. Mild. Leukocytosis - Mild. Likely reactive.  P:  Trending cell counts daily w/ CBC Trending fever curve SCDs  INFECTIOUS A: Possible Aspiration Pneumonia vs HCAP  P:   Empiric Vancomycin d/c'd per NS Zosyn Day #3 Trending PCT per algorithm Awaiting Tracheal Aspirate Culture Repeat Blood Cx for Temp > 101.5  ENDOCRINE A: H/O DM Type 2  P:   Hold home metformin  Accu-checks q4hr SSI per Resistant Algorithm   NEUROLOGIC A: Pain Control Post-op - Extensive lumbar fusion 7/12. Sedation on Ventilator Acute Encephalopathy - Metabolic  vs hypercarbia w/ excessive sedation.  P:   Desired RASS:  0 to -1 Fentanyl IV prn Versed IV prn  Bevelyn Ngo, AGACNP-BC [place] Pulmonary/Critical Care Medicine 10/07/2015  9:32 AM Pager: [phone]  PCCM Attending Note: Patient seen & examined with nurse practitioner. Please refer to her progress note which I have reviewed in detail. Patient continuing to have low-grade fevers. Attempting to obtain transesophageal echocardiogram to better evaluate wall motion. If there is wall motion abnormality plan for cardiology consultation. Electrolytes replaced. Continuing to wean vasopressor support while continuing broad-spectrum antibiotic coverage. Vancomycin discontinued by neurosurgery. Continuing pressure support wean. Hopefully can attempt a spontaneous breathing trial in the morning.  I have spent a total of 36 minutes of critical care time today caring for the patient and reviewing the patient's electronic medical record.  Donna Christen Jamison Neighbor, M.D. [facility] Pulmonary & Critical Care Pager:  [phone] After 3pm or if no response, call [phone] 10:30 AM 10/07/2015

## 2015-10-07 NOTE — Progress Notes (Signed)
OT Cancellation Note  Patient Details Name: Danny Patterson MRN: [id number] DOB: 22-Mar-1946   Cancelled Treatment:    Reason Eval/Treat Not Completed: Patient not medically ready. Pt intubated on vent with MD Botero notes incr HOB this AM . Please reorder Physical Therapy as appropriate.   Felecia ShellingJones, Navi Ewton B   Seva Chancy, Brynn   OTR/L Pager: [phone] Office: [phone] .  10/07/2015, 7:32 AM

## 2015-10-07 NOTE — Progress Notes (Signed)
  Patient is scheduled for TEE 7/19 to better assess cardiac function as 2D echo was of poor quality and non diagnosistic  given poor sound wave transmission, poor patient compliance, and excessive abdominal air. Patient is intubated but I was able to speak with his wife and daughter. I discussed indication of test + procedural details including potential risk. All questions and concerns were addressed. They have given verbal consent. RN to obtain written consent.   Brittainy Sharol HarnessSimmons 10/07/2015

## 2015-10-07 NOTE — Care Management Note (Signed)
Case Management Note  Patient Details  Name: Danny StacksGary L Patterson MRN: [id number] Date of Birth: July 07, 1945  Subjective/Objective:    Pt intubated over the weekend for respiratory insufficiency, hypoxia, shock.   Still has lumbar drain, on levophed drip.                  Action/Plan: Will continue to follow for discharge planning as pt progresses.    Expected Discharge Date:                  Expected Discharge Plan:  IP Rehab Facility  In-House Referral:     Discharge planning Services  CM Consult  Post Acute Care Choice:    Choice offered to:     DME Arranged:    DME Agency:     HH Arranged:    HH Agency:     Status of Service:  In process, will continue to follow  If discussed at Long Length of Stay Meetings, dates discussed:    Additional Comments:  Quintella BatonJulie W. Janiyla Long, RN, BSN  Trauma/Neuro ICU Case Manager [phone]

## 2015-10-08 ENCOUNTER — Inpatient Hospital Stay (HOSPITAL_COMMUNITY): Payer: PPO

## 2015-10-08 ENCOUNTER — Encounter (HOSPITAL_COMMUNITY)
Admission: RE | Disposition: A | Discharge status: Rehabilitation Facility | Payer: Self-pay | Source: Ambulatory Visit | Attending: Neurosurgery

## 2015-10-08 DIAGNOSIS — D649 Anemia, unspecified: Secondary | ICD-10-CM | POA: Diagnosis not present

## 2015-10-08 DIAGNOSIS — E876 Hypokalemia: Secondary | ICD-10-CM

## 2015-10-08 DIAGNOSIS — J9690 Respiratory failure, unspecified, unspecified whether with hypoxia or hypercapnia: Secondary | ICD-10-CM | POA: Diagnosis not present

## 2015-10-08 DIAGNOSIS — R7989 Other specified abnormal findings of blood chemistry: Secondary | ICD-10-CM

## 2015-10-08 DIAGNOSIS — J9601 Acute respiratory failure with hypoxia: Secondary | ICD-10-CM | POA: Diagnosis not present

## 2015-10-08 DIAGNOSIS — J189 Pneumonia, unspecified organism: Secondary | ICD-10-CM | POA: Diagnosis not present

## 2015-10-08 DIAGNOSIS — R570 Cardiogenic shock: Secondary | ICD-10-CM

## 2015-10-08 DIAGNOSIS — R931 Abnormal findings on diagnostic imaging of heart and coronary circulation: Secondary | ICD-10-CM | POA: Diagnosis not present

## 2015-10-08 LAB — CBC WITH DIFFERENTIAL/PLATELET
BASOS PCT: 0 %
Basophils Absolute: 0 10*3/uL (ref 0.0–0.1)
EOS ABS: 0.3 10*3/uL (ref 0.0–0.7)
Eosinophils Relative: 4 %
HCT: 22 % — ABNORMAL LOW (ref 39.0–52.0)
HEMOGLOBIN: 7.4 g/dL — AB (ref 13.0–17.0)
Lymphocytes Relative: 21 %
Lymphs Abs: 1.6 10*3/uL (ref 0.7–4.0)
MCH: 30.6 pg (ref 26.0–34.0)
MCHC: 33.6 g/dL (ref 30.0–36.0)
MCV: 90.9 fL (ref 78.0–100.0)
Monocytes Absolute: 0.8 10*3/uL (ref 0.1–1.0)
Monocytes Relative: 11 %
NEUTROS PCT: 64 %
Neutro Abs: 4.7 10*3/uL (ref 1.7–7.7)
Platelets: 253 10*3/uL (ref 150–400)
RBC: 2.42 MIL/uL — AB (ref 4.22–5.81)
RDW: 13.7 % (ref 11.5–15.5)
WBC: 7.4 10*3/uL (ref 4.0–10.5)

## 2015-10-08 LAB — BASIC METABOLIC PANEL
Anion gap: 5 (ref 5–15)
BUN: 23 mg/dL — ABNORMAL HIGH (ref 6–20)
CHLORIDE: 116 mmol/L — AB (ref 101–111)
CO2: 20 mmol/L — ABNORMAL LOW (ref 22–32)
CREATININE: 0.86 mg/dL (ref 0.61–1.24)
Calcium: 8.4 mg/dL — ABNORMAL LOW (ref 8.9–10.3)
Glucose, Bld: 152 mg/dL — ABNORMAL HIGH (ref 65–99)
POTASSIUM: 3.2 mmol/L — AB (ref 3.5–5.1)
SODIUM: 141 mmol/L (ref 135–145)

## 2015-10-08 LAB — GLUCOSE, CAPILLARY
GLUCOSE-CAPILLARY: 144 mg/dL — AB (ref 65–99)
GLUCOSE-CAPILLARY: 122 mg/dL — AB (ref 65–99)
GLUCOSE-CAPILLARY: 142 mg/dL — AB (ref 65–99)
GLUCOSE-CAPILLARY: 125 mg/dL — AB (ref 65–99)
Glucose-Capillary: 152 mg/dL — ABNORMAL HIGH (ref 65–99)
Glucose-Capillary: 125 mg/dL — ABNORMAL HIGH (ref 65–99)

## 2015-10-08 LAB — TROPONIN I: TROPONIN I: 0.07 ng/mL — AB (ref <0.03)

## 2015-10-08 SURGERY — ECHOCARDIOGRAM, TRANSESOPHAGEAL
Anesthesia: Moderate Sedation

## 2015-10-08 MED ORDER — BISACODYL 10 MG RE SUPP
10.0000 mg | Freq: Once | RECTAL | Status: AC
  Administered 2015-10-08: 10 mg via RECTAL
  Filled 2015-10-08: qty 1

## 2015-10-08 MED ORDER — SODIUM CHLORIDE 0.9 % IV SOLN: INTRAVENOUS | Status: DC

## 2015-10-08 MED ORDER — POTASSIUM CHLORIDE 20 MEQ/15ML (10%) PO SOLN
40.0000 meq | ORAL | Status: AC
Start: 2015-10-08 — End: 2015-10-08
  Administered 2015-10-08 (×2): 40 meq
  Filled 2015-10-08 (×2): qty 30

## 2015-10-08 MED ORDER — FENTANYL CITRATE (PF) 100 MCG/2ML IJ SOLN
INTRAMUSCULAR | Status: AC
  Administered 2015-10-08: 50 ug via INTRAVENOUS
  Filled 2015-10-08: qty 2

## 2015-10-08 MED ORDER — FENTANYL CITRATE (PF) 100 MCG/2ML IJ SOLN
100.0000 ug | Freq: Once | INTRAMUSCULAR | Status: AC
  Administered 2015-10-08: 50 ug via INTRAVENOUS

## 2015-10-08 NOTE — Progress Notes (Signed)
  Echocardiogram Echocardiogram Transesophageal has been performed.  Nolon RodBrown, Tony 10/08/2015, 4:31 PM

## 2015-10-08 NOTE — Progress Notes (Signed)
INDICATIONS: uninterpretable transthoracic echo  PROCEDURE:   Informed consent was obtained prior to the procedure. The risks, benefits and alternatives for the procedure were discussed and the patient comprehended these risks.  Risks include, but are not limited to, cough, sore throat, vomiting, nausea, somnolence, esophageal and stomach trauma or perforation, bleeding, low blood pressure, aspiration, pneumonia, infection, trauma to the teeth and death.    After a procedural time-out, the oropharynx was anesthetized with 20% benzocaine spray.   During this procedure the patient was administered a total of Versed 2 mg and Fentanyl 50 mcg to achieve and maintain moderate conscious sedation.  The patient's heart rate, blood pressure, and oxygen saturationweare monitored continuously during the procedure. The period of conscious sedation was 15 minutes, of which I was present face-to-face 100% of this time. The patient was already endotracheally intubated.  The transesophageal probe was inserted in the esophagus and stomach without difficulty and multiple views were obtained.  The patient was kept under observation until the patient left the procedure room.  The patient left the procedure room in stable condition.   Agitated microbubble saline contrast was not administered.  COMPLICATIONS:    There were no immediate complications.  FINDINGS:  All chambers are normal in size. Normal LV systolic function. EF 55-60%. No evidence of increased filling pressures. No valvular abnormalities. No pericardial effusion. Unable to estimate the PA pressure (absent TR). No pericardial effusion. Mild atheromatous plaque in the descending aorta.  RECOMMENDATIONS:   Evaluate for noncardiac causes of respiratory difficulties.  Time Spent Directly with the Patient:  30 minutes   Danny Patterson 10/08/2015, 4:21 PM

## 2015-10-08 NOTE — Progress Notes (Signed)
PULMONARY / CRITICAL CARE MEDICINE   Name: Danny Patterson MRN: [id number] DOB: 21-Oct-1945    ADMISSION DATE:  10/01/2015 CONSULTATION DATE:  10/01/2015  REFERRING MD:  Jeral Fruit   CHIEF COMPLAINT:  Hypoxia   SUBJECTIVE: No acute events overnight. Patient planned for TEE today.   REVIEW OF SYSTEMS: Unable to obtain given intubation and sedation.  VITAL SIGNS: BP 121/66 mmHg  Pulse 66  Temp(Src) 99.4 F (37.4 C) (Axillary)  Resp 22  Ht 5\' 10"  (1.778 m)  Wt 230 lb 13.2 oz (104.7 kg)  BMI 33.12 kg/m2  SpO2 100%  HEMODYNAMICS: CVP:  [3 mmHg-11 mmHg] 11 mmHg  VENTILATOR SETTINGS: Vent Mode:  [-] PRVC FiO2 (%):  [40 %] 40 % Set Rate:  [22 bmp] 22 bmp Vt Set:  [580 mL] 580 mL PEEP:  [5 cmH20] 5 cmH20 Pressure Support:  [5 cmH20] 5 cmH20 Plateau Pressure:  [15 cmH20-16 cmH20] 15 cmH20  INTAKE / OUTPUT: I/O last 3 completed shifts: In: 5417.2 [I.V.:3687.2; NG/GT:1320; IV Piggyback:410] Out: 4825 [Urine:4825]  PHYSICAL EXAMINATION: General: No distress. Sleeping until awoken.  Neuro: Sedated. Spontaneously moving all 4 extremities with stimulation. Pupils equal and symmetric.  HEENT: Endotracheal tube in place. No scleral icterus or injection. Cardiac: Regular rhythm & rate. No edema. Pulmonary: Symmetric chest rise on ventilator. Slightly coarse breath sounds bilaterally. Abdomen: Soft. Protuberant. Normal bowel sounds. Integument: Warm and dry. No rash on exposed skin.  LABS:  BMET  Recent Labs Lab 10/06/15 0446 10/07/15 0458 10/08/15 0358  NA 139 141 141  K 3.3* 3.1* 3.2*  CL 111 112* 116*  CO2 17* 20* 20*  BUN 14 15 23*  CREATININE 1.04 0.77 0.86  GLUCOSE 137* 163* 152*    Electrolytes  Recent Labs Lab 10/06/15 0446  10/06/15 1718 10/07/15 0458 10/07/15 1722 10/07/15 1949 10/08/15 0358  CALCIUM 8.9  --   --  8.5*  --   --  8.4*  MG  --   < > 1.7 1.6* 1.9  --   --   PHOS  --   < > 2.5 1.5* 2.6 2.9  --   < > = values in this interval not  displayed.  CBC  Recent Labs Lab 10/05/15 1610 10/06/15 0446 10/08/15 0358  WBC 7.0 10.6* 7.4  HGB 7.4* 8.9* 7.4*  HCT 22.7* 27.1* 22.0*  PLT 150 277 253    Coag's  Recent Labs Lab 10/02/15 1336  INR 1.08    Sepsis Markers  Recent Labs Lab 10/05/15 1610 10/05/15 1700 10/06/15 0446 10/07/15 0458  LATICACIDVEN  --  0.7  --   --   PROCALCITON <0.10  --  0.13 0.11    ABG  Recent Labs Lab 10/05/15 1810 10/06/15 0435  PHART 7.308* 7.422  PCO2ART 43.8 25.2*  PO2ART 250* 170*    Liver Enzymes  Recent Labs Lab 10/05/15 1610 10/07/15 0458  AST 22  --   ALT 14*  --   ALKPHOS 39  --   BILITOT 0.7  --   ALBUMIN 2.4* 2.3*    Cardiac Enzymes  Recent Labs Lab 10/06/15 0021 10/06/15 0446 10/07/15 0953  TROPONINI 0.14* 0.42* 0.23*    Glucose  Recent Labs Lab 10/07/15 1132 10/07/15 1529 10/07/15 1950 10/07/15 2341 10/08/15 0353 10/08/15 0839  GLUCAP 162* 168* 165* 138* 144* 152*    Imaging Dg Chest Port 1 View  10/08/2015  CLINICAL DATA:  Respiratory failure . EXAM: PORTABLE CHEST 1 VIEW COMPARISON:  10/06/2015.  10/05/2015.  CT  10/05/2015 . FINDINGS: Interim placement of feeding tube, its tip is below left hemidiaphragm. Endotracheal tube and left IJ line stable position. Cardiomegaly with normal pulmonary vascularity. Low lung volumes with bibasilar atelectasis and/or infiltrates. Small left pleural effusion. Degenerative changes thoracic spine. IMPRESSION: 1. Interim placement of feeding tube, its tip is below left hemidiaphragm. Endotracheal tube left IJ line stable position. 2. Low lung volumes with bibasilar atelectasis and/or infiltrates. Small left pleural effusion again noted. 3.  Stable cardiomegaly.  Pulmonary vascularity is normal. Electronically Signed   By: Maisie Fus  Register   On: 10/08/2015 07:22     STUDIES:  CTA Chest 7/16: 1. No evidence of pulmonary embolism.  2. Moderate lower lobe atelectasis with small layering  pleural effusions. CT Head W/O 7/16:  No acute abnormality. TTE 7/16:  Poor quality. Port CXR 7/17:  ETT in good position. Questionable left basilar opacity.  Port CXR 7/19:  Endotracheal tube and central venous catheter in good position. Persistent left basilar opacity. Slight rotation left.  MICROBIOLOGY: MRSA PCR 7/5:  Negative Blood Ctx x2 7/16>>> Tracheal Asp Ctx 7/16:  Oral flora.  ANTIBIOTICS: Vancomycin 7/16 - 7/18 Zosyn 7/16>>>  SIGNIFICANT EVENTS: 7/12 OR for lumbar fusion  7/16 Altered mental status, shock, VDRF  LINES/TUBES: OETT 8.0 7/16>>> L IJ CVL 7/16>>> FOLEY 7/12>>> LUMBAR DRAIN 7/12>>> PIV x1  ASSESSMENT / PLAN:  PULMONARY A: Acute Hypoxic Respiratory Failure - Aspiration Pneumonia vs HCAP. Acute Hypercarbic Respiratory Failure - Secondary to oversedation in setting of OSA. Presumed OSA HCAP vs Aspiration Pneumonia  P:   Continue PS wean Intermittent CXR & ABG SBT post TEE Goal Sats >94%  CARDIOVASCULAR A: Shock - Septic vs Cardiogenic. Improving. CVP=6 10/07/15. Elevated Troponin I - Possible demand ischemia. Resolving. H/O HTN H/O Hyperlipidemia  P:  Vitals per unit protocol Continuous Monitoring on telemetry Levophed to keep MAP > 65 TEE today  Cardiology consult if new wall motion abnormality on TEE Hold home norvasc, lasix, labetolol   RENAL A: Hypophosphatemia - Replaced. Hypomagnesemia - Replaced.  Oliguria - Improving. Hypokalemia - Mild. Replaced.  P:   Trending UOP with Foley Monitoring electrolytes & renal function daily  Replacing electrolytes as indicated KCl VT x2 today  GASTROINTESTINAL A: No acute issues.  P:   Holding Tube Feedings in anticipation of TEE Protonix VT daily NPO  HEMATOLOGIC A: Anemia - No signs of active bleeding. Mild. Leukocytosis - Resolved.  P:  Trending cell counts daily w/ CBC Trending fever curve SCDs  INFECTIOUS A: Possible Aspiration Pneumonia vs HCAP  P:    Empiric Vancomycin d/c'd per NS Zosyn Day #4 Trending PCT per algorithm Awaiting Blood Culture result Repeat Blood Cx for Temp > 101.5  ENDOCRINE A: H/O DM Type 2  P:   Hold home metformin  Accu-checks q4hr SSI per Resistant Algorithm   NEUROLOGIC A: Pain Control Post-op - Extensive lumbar fusion 7/12. Sedation on Ventilator Acute Encephalopathy - Metabolic vs hypercarbia w/ excessive sedation.  P:   Desired RASS:  0 to -1 Fentanyl IV prn Versed IV prn  FAMILY UPDATES:  No family at bedside 7/19.  TODAY'S SUMMARY:  70yo male with hx HTN, DM, chronic back pain s/p lumbar fusion 7/12 with post op hypoventilation and hypoxia.  Developed altered mental status, respiratory failure, and shock 7/16. Shock is slowly improving. Troponin I is improving as well. Awaiting TEE result. Holding on extubation until after TEE.  I have spent a total of 31 minutes of critical care time today  caring for the patient and reviewing the patient's electronic medical record.  Donna ChristenJennings E. Jamison NeighborNestor, M.D. [facility] Pager:  [phone] After 3pm or if no response, call [phone] 9:49 AM 10/08/2015

## 2015-10-08 NOTE — Progress Notes (Addendum)
Entered in Error

## 2015-10-08 NOTE — Progress Notes (Signed)
Patient ID: Danny StacksGary L Patterson, male   DOB: [birth year], 70 y.o.   MRN: [id number] Sedated. For doppler today at 4 pm. i will ask CCM to talk with hios family. i will remove the lumbar drain this pm

## 2015-10-08 NOTE — Progress Notes (Signed)
Patient ID: Danny StacksGary L Patterson, male   DOB: [birth year], 70 y.o.   MRN: [id number] lp drain removed. Spoke with family.

## 2015-10-09 DIAGNOSIS — R7989 Other specified abnormal findings of blood chemistry: Secondary | ICD-10-CM | POA: Diagnosis not present

## 2015-10-09 DIAGNOSIS — D649 Anemia, unspecified: Secondary | ICD-10-CM | POA: Diagnosis not present

## 2015-10-09 DIAGNOSIS — J189 Pneumonia, unspecified organism: Secondary | ICD-10-CM | POA: Diagnosis not present

## 2015-10-09 DIAGNOSIS — E876 Hypokalemia: Secondary | ICD-10-CM | POA: Diagnosis not present

## 2015-10-09 DIAGNOSIS — R570 Cardiogenic shock: Secondary | ICD-10-CM | POA: Diagnosis not present

## 2015-10-09 DIAGNOSIS — J9601 Acute respiratory failure with hypoxia: Secondary | ICD-10-CM | POA: Diagnosis not present

## 2015-10-09 LAB — CBC WITH DIFFERENTIAL/PLATELET
BASOS ABS: 0 10*3/uL (ref 0.0–0.1)
Basophils Relative: 0 %
Eosinophils Absolute: 0.2 10*3/uL (ref 0.0–0.7)
Eosinophils Relative: 2 %
HEMATOCRIT: 20.8 % — AB (ref 39.0–52.0)
HEMOGLOBIN: 6.8 g/dL — AB (ref 13.0–17.0)
LYMPHS PCT: 13 %
Lymphs Abs: 1 10*3/uL (ref 0.7–4.0)
MCH: 30.5 pg (ref 26.0–34.0)
MCHC: 33.2 g/dL (ref 30.0–36.0)
MCV: 92 fL (ref 78.0–100.0)
MONO ABS: 0.5 10*3/uL (ref 0.1–1.0)
MONOS PCT: 7 %
NEUTROS ABS: 6.2 10*3/uL (ref 1.7–7.7)
Neutrophils Relative %: 78 %
Platelets: 237 10*3/uL (ref 150–400)
RBC: 2.26 MIL/uL — ABNORMAL LOW (ref 4.22–5.81)
RDW: 14 % (ref 11.5–15.5)
WBC: 8 10*3/uL (ref 4.0–10.5)

## 2015-10-09 LAB — GLUCOSE, CAPILLARY
GLUCOSE-CAPILLARY: 114 mg/dL — AB (ref 65–99)
GLUCOSE-CAPILLARY: 116 mg/dL — AB (ref 65–99)
Glucose-Capillary: 105 mg/dL — ABNORMAL HIGH (ref 65–99)
Glucose-Capillary: 108 mg/dL — ABNORMAL HIGH (ref 65–99)
Glucose-Capillary: 118 mg/dL — ABNORMAL HIGH (ref 65–99)
Glucose-Capillary: 123 mg/dL — ABNORMAL HIGH (ref 65–99)

## 2015-10-09 LAB — RENAL FUNCTION PANEL
ALBUMIN: 2.1 g/dL — AB (ref 3.5–5.0)
ANION GAP: 3 — AB (ref 5–15)
BUN: 21 mg/dL — ABNORMAL HIGH (ref 6–20)
CALCIUM: 8.7 mg/dL — AB (ref 8.9–10.3)
CO2: 20 mmol/L — ABNORMAL LOW (ref 22–32)
Chloride: 120 mmol/L — ABNORMAL HIGH (ref 101–111)
Creatinine, Ser: 0.91 mg/dL (ref 0.61–1.24)
GFR calc non Af Amer: >60 mL/min (ref >60)
Glucose, Bld: 139 mg/dL — ABNORMAL HIGH (ref 65–99)
PHOSPHORUS: 3.4 mg/dL (ref 2.5–4.6)
Potassium: 3.9 mmol/L (ref 3.5–5.1)
SODIUM: 143 mmol/L (ref 135–145)

## 2015-10-09 LAB — PREPARE RBC (CROSSMATCH)

## 2015-10-09 LAB — TROPONIN I: TROPONIN I: 0.04 ng/mL — AB (ref <0.03)

## 2015-10-09 LAB — MAGNESIUM: Magnesium: 1.9 mg/dL (ref 1.7–2.4)

## 2015-10-09 LAB — HEMOGLOBIN AND HEMATOCRIT, BLOOD
HCT: 19 % — ABNORMAL LOW (ref 39.0–52.0)
HEMOGLOBIN: 6.5 g/dL — AB (ref 13.0–17.0)

## 2015-10-09 MED ORDER — SODIUM CHLORIDE 0.9 % IV SOLN
Freq: Once | INTRAVENOUS | Status: AC
  Administered 2015-10-09: 15:00:00 via INTRAVENOUS

## 2015-10-09 MED ORDER — SODIUM CHLORIDE 0.9 % IV SOLN: Freq: Once | INTRAVENOUS | Status: AC

## 2015-10-09 MED ORDER — KETOROLAC TROMETHAMINE 15 MG/ML IJ SOLN
15.0000 mg | Freq: Four times a day (QID) | INTRAMUSCULAR | Status: DC | PRN
  Administered 2015-10-09 – 2015-10-13 (×3): 15 mg via INTRAVENOUS
  Filled 2015-10-09 (×3): qty 1

## 2015-10-09 MED ORDER — FUROSEMIDE 10 MG/ML IJ SOLN
20.0000 mg | Freq: Once | INTRAMUSCULAR | Status: AC
  Administered 2015-10-09: 20 mg via INTRAVENOUS
  Filled 2015-10-09: qty 2

## 2015-10-09 NOTE — Progress Notes (Signed)
eLink Physician-Brief Progress Note Patient Name: Danny StacksGary L Abercrombie DOB: Dec 27, 1945 MRN: [id number]   Date of Service  10/09/2015  HPI/Events of Note  Hb=6.8  eICU Interventions  Recheck H/H, If still low, then transfuse 1u PRBC     Intervention Category Major Interventions: Other:  Saraiah Bhat 10/09/2015, 2:52 AM

## 2015-10-09 NOTE — Care Management Important Message (Signed)
Important Message  Patient Details  Name: Danny Patterson MRN: [id number] Date of Birth: [birth year]   Medicare Important Message Given:  Yes    [name] 10/09/2015, 10:59 AM

## 2015-10-09 NOTE — Progress Notes (Signed)
eLink Physician-Brief Progress Note Patient Name: Danny StacksGary L Patterson DOB: Mar 31, 1945 MRN: [id number]   Date of Service  10/09/2015  HPI/Events of Note  Pain - patient/family refusing Tylenol PR. Creatinine = 0.91.   eICU Interventions  Will order Toradol 15 mg IV Q 6 hours PRN pain.      Intervention Category Intermediate Interventions: Abdominal pain - evaluation and management;Pain - evaluation and management  Sommer,Steven Eugene 10/09/2015, 9:32 PM

## 2015-10-09 NOTE — Procedures (Signed)
Extubation Procedure Note  Patient Details:   Name: Danny Patterson DOB: 25-Jan-1946 MRN: [id number]   Airway Documentation:   + air leak test prior to extubation  Evaluation  O2 sats: stable throughout Complications: No apparent complications Patient did tolerate procedure well. Bilateral Breath Sounds: Diminished   Yes, pt able to speak.  No distress noted, VSS, no stridor noted.  Sat 100% on 2 lpm [place].  MD in room post extubation to eval pt.    Jennette KettleBrowning, Otis Portal Joy 10/09/2015, 5:27 PM

## 2015-10-09 NOTE — Progress Notes (Signed)
Patient ID: Danny StacksGary L Patterson, male   DOB: [birth year], 70 y.o.   MRN: [id number] i saw t mr Danny Patterson early this am. Intubated but awake , moving all 4 extremities. According tohis RN , CCM was to tansfuse him before being extubated. Dressing dry

## 2015-10-09 NOTE — Progress Notes (Signed)
Pharmacy Antibiotic Note  Danny Patterson is a 70 y.o. male admitted on 10/01/2015 with pneumonia.  Pharmacy has been consulted for zosyn dosing. Tmax is 101 and WBC is WNL. Scr is WNL and has been stable.   Plan: - Continue Zosyn 3.375gm IV Q8H (4 hr inf) - F/u renal fxn, C&S, clinical status *Pharmacy will sign off as no further dose adjustments are anticipated. MD please evaluate planned LOT. Thank you for the consult!  Height: 5\' 10"  (177.8 cm) Weight: 233 lb 0.4 oz (105.7 kg) IBW/kg (Calculated) : 73  Temp (24hrs), Avg:99.2 F (37.3 C), Min:97.7 F (36.5 C), Max:101 F (38.3 C)   Recent Labs Lab 10/04/15 0302 10/05/15 1610 10/05/15 1700 10/06/15 0446 10/07/15 0458 10/08/15 0358 10/09/15 0224 10/09/15 0225  WBC 9.5 7.0  --  10.6*  --  7.4 8.0  --   CREATININE 0.85 1.12  --  1.04 0.77 0.86  --  0.91  LATICACIDVEN  --   --  0.7  --   --   --   --   --     Estimated Creatinine Clearance: 92 mL/min (by C-G formula based on Cr of 0.91).    Allergies  Allergen Reactions  . Ace Inhibitors Other (See Comments)    Angioedema.    Antimicrobials this admission: Vanc 7/16>>7/19 Zosyn 7/16>>  Dose adjustments this admission: N/A  Microbiology results: 7/16 Blood - NGTD 7/16 Resp - normal flora  Thank you for allowing pharmacy to be a part of this patient's care.  Danny Patterson, Danny Patterson 10/09/2015 7:15 AM

## 2015-10-09 NOTE — Progress Notes (Signed)
eLink Physician-Brief Progress Note Patient Name: Danny StacksGary L Patterson DOB: 05/03/1945 MRN: [id number]   Date of Service  10/09/2015  HPI/Events of Note  Hb=6.2  eICU Interventions  Transfuse 1u prbc followed by lasix 20mg  IV x 1     Intervention Category Intermediate Interventions: Other:  Elease Swarm 10/09/2015, 7:02 AM

## 2015-10-09 NOTE — Progress Notes (Signed)
CRITICAL VALUE ALERT  Critical value received:  hgb 6.8  Date of notification:  10/09/15  Time of notification:  0248  Critical value read back: yes  Nurse who received alert:  A Aarin Sparkman  MD notified (1st page):  V Mungal  Time of first page:  0250  MD notified (2nd page):   Time of second page:  Responding MD:  Erin FullingV Mungal  Time MD responded: 0250  New orders received. RN will continue to monitor.

## 2015-10-09 NOTE — Progress Notes (Signed)
PCCM Attending Note: Wife, son, and daughter updated extensively at bedside regarding thought process and safety concerns with regards to extubation. I explained that he is EXTREMELY sensitive to sedatives and pain medications as evidenced by the fact that he received his last dose of medication around 2am and only just now at 5pm is able to nod appropriately to questions despite appearing somewhat uncomfortable. I explained that my recommendation for pain control upon extubation would be only Tylenol initially and then possibly Ultram which his wife reports he has tolerated in the past given the potential for worsening mental status & respiratory status requiring reintubation or even possibly cardiac arrest in the event of aspiration. They all acknowledged understanding. The patient passed his spontaneous breathing trial this morning and I feel it is reasonable to extubate the patient at this time assuming he has a cuff leak. I answered all of their questions to the best of my abilities.  Donna ChristenJennings E. Jamison NeighborNestor, M.D. [facility] Pager:  [phone] After 3pm or if no response, call [phone] 5:18 PM 10/09/2015

## 2015-10-09 NOTE — Progress Notes (Signed)
PULMONARY / CRITICAL CARE MEDICINE   Name: Danny Patterson MRN: [id number] DOB: 1945/05/17    ADMISSION DATE:  10/01/2015 CONSULTATION DATE:  10/01/2015  REFERRING MD:  Jeral Fruit   CHIEF COMPLAINT:  Hypoxia   SUBJECTIVE: No acute events overnight. S/P TEE yesterday. Somnolent today but passing SBT. Minimal secretions per RN.  REVIEW OF SYSTEMS: Unable to obtain given intubation and sedation.  VITAL SIGNS: BP 114/57 mmHg  Pulse 95  Temp(Src) 97.9 F (36.6 C) (Axillary)  Resp 17  Ht 5\' 10"  (1.778 m)  Wt 233 lb 0.4 oz (105.7 kg)  BMI 33.44 kg/m2  SpO2 100%  HEMODYNAMICS: CVP:  [1 mmHg-21 mmHg] 6 mmHg  VENTILATOR SETTINGS: Vent Mode:  [-] PSV;CPAP FiO2 (%):  [40 %] 40 % Set Rate:  [22 bmp] 22 bmp Vt Set:  [580 mL] 580 mL PEEP:  [5 cmH20] 5 cmH20 Pressure Support:  [5 cmH20] 5 cmH20 Plateau Pressure:  [10 cmH20-16 cmH20] 16 cmH20  INTAKE / OUTPUT: I/O last 3 completed shifts: In: 4280.1 [I.V.:3710.1; NG/GT:520; IV Piggyback:50] Out: 2705 [Urine:2705]  PHYSICAL EXAMINATION: General: No distress. Sedate. Daughter at bedside..  Neuro: Sedated. Continues to move extremities spontaneously. Nods to questions but does not follow commands. HEENT: Endotracheal tube in place. No scleral icterus or injection. Cardiac: Regular rhythm & rate. No edema. No appreciable JVD. Pulmonary: Symmetric chest rise on ventilator. Slightly coarse breath sounds bilaterally. Abdomen: Soft. Protuberant. Normal bowel sounds. Integument: Warm and dry. No rash on exposed skin.  LABS:  BMET  Recent Labs Lab 10/07/15 0458 10/08/15 0358 10/09/15 0225  NA 141 141 143  K 3.1* 3.2* 3.9  CL 112* 116* 120*  CO2 20* 20* 20*  BUN 15 23* 21*  CREATININE 0.77 0.86 0.91  GLUCOSE 163* 152* 139*    Electrolytes  Recent Labs Lab 10/07/15 0458 10/07/15 1722 10/07/15 1949 10/08/15 0358 10/09/15 0224 10/09/15 0225  CALCIUM 8.5*  --   --  8.4*  --  8.7*  MG 1.6* 1.9  --   --  1.9  --   PHOS 1.5*  2.6 2.9  --   --  3.4    CBC  Recent Labs Lab 10/06/15 0446 10/08/15 0358 10/09/15 0224 10/09/15 0500  WBC 10.6* 7.4 8.0  --   HGB 8.9* 7.4* 6.8* 6.5*  HCT 27.1* 22.0* 20.8* 19.0*  PLT 277 253 237  --     Coag's  Recent Labs Lab 10/02/15 1336  INR 1.08    Sepsis Markers  Recent Labs Lab 10/05/15 1610 10/05/15 1700 10/06/15 0446 10/07/15 0458  LATICACIDVEN  --  0.7  --   --   PROCALCITON <0.10  --  0.13 0.11    ABG  Recent Labs Lab 10/05/15 1810 10/06/15 0435  PHART 7.308* 7.422  PCO2ART 43.8 25.2*  PO2ART 250* 170*    Liver Enzymes  Recent Labs Lab 10/05/15 1610 10/07/15 0458 10/09/15 0225  AST 22  --   --   ALT 14*  --   --   ALKPHOS 39  --   --   BILITOT 0.7  --   --   ALBUMIN 2.4* 2.3* 2.1*    Cardiac Enzymes  Recent Labs Lab 10/07/15 0953 10/08/15 0934 10/09/15 0953  TROPONINI 0.23* 0.07* 0.04*    Glucose  Recent Labs Lab 10/08/15 1152 10/08/15 1549 10/08/15 2001 10/08/15 2315 10/09/15 0343 10/09/15 0844  GLUCAP 122* 142* 125* 125* 105* 123*    Imaging No results found.   STUDIES:  CTA  Chest 7/16: 1. No evidence of pulmonary embolism.  2. Moderate lower lobe atelectasis with small layering pleural effusions. CT Head W/O 7/16:  No acute abnormality. TTE 7/16:  Poor quality. Port CXR 7/17:  ETT in good position. Questionable left basilar opacity.  Port CXR 7/19:  Endotracheal tube and central venous catheter in good position. Persistent left basilar opacity. Slight rotation left. TEE 7/19: LVEF 55-60%. No evidence of thrombus. Trivial aortic regurgitation. No regional wall motion abnormalities.  MICROBIOLOGY: MRSA PCR 7/5:  Negative Blood Ctx x2 7/16>>> Tracheal Asp Ctx 7/16:  Oral flora.  ANTIBIOTICS: Vancomycin 7/16 - 7/18 Zosyn 7/16>>>  SIGNIFICANT EVENTS: 7/12 OR for lumbar fusion  7/16 Altered mental status, shock, VDRF  LINES/TUBES: OETT 8.0 7/16>>> L IJ CVL 7/16>>> FOLEY 7/12>>> LUMBAR DRAIN  7/12>>> PIV x1  ASSESSMENT / PLAN:  PULMONARY A: Acute Hypoxic Respiratory Failure - Aspiration Pneumonia vs HCAP. Acute Hypercarbic Respiratory Failure - Secondary to oversedation in setting of OSA. Presumed OSA HCAP vs Aspiration Pneumonia  P:   Continue PS wean Intermittent CXR & ABG Goal Sats >94% Plan for extubation with a cuff leak if mental status imprves  CARDIOVASCULAR A: Shock - Septic vs Cardiogenic. Resolved. CVP=6 10/07/15. TEE normal. Elevated Troponin I - Possible demand ischemia. Resolving. H/O HTN H/O Hyperlipidemia  P:  Vitals per unit protocol Continuous Monitoring on telemetry Levophed to keep MAP > 65 Hold home norvasc, lasix, labetolol  Lasix IV x1 post transfusion  RENAL A: Hypophosphatemia - Resolved. Hypomagnesemia - Resolved. Oliguria - Improving. Hypokalemia - Resolved.  P:   Trending UOP with Foley Monitoring electrolytes & renal function daily  Replacing electrolytes as indicated  GASTROINTESTINAL A: No acute issues.  P:   Holding Tube Feedings in anticipation of TEE Protonix VT daily NPO  HEMATOLOGIC A: Anemia - No signs of active bleeding. Mild.  Leukocytosis - Resolved.  P:  Trending cell counts daily w/ CBC Trending fever curve SCDs Transfusing 1u PRBC  INFECTIOUS A: Possible Aspiration Pneumonia vs HCAP  P:   Empiric Vancomycin d/c'd per NS Zosyn Day #5 Trending PCT per algorithm Awaiting Blood Culture result Repeat Blood Cx for Temp > 101.5  ENDOCRINE A: H/O DM Type 2  P:   Hold home metformin  Accu-checks q4hr SSI per Resistant Algorithm   NEUROLOGIC A: Pain Control Post-op - Extensive lumbar fusion 7/12. Sedation on Ventilator Acute Encephalopathy - Metabolic vs hypercarbia w/ excessive sedation.  P:   Desired RASS:  0  Fentanyl IV prn (last dose 1am) Versed IV prn (last dose 2am) Holding sedation pending extubation  FAMILY UPDATES:  Daughter updated at bedside by Dr. Jamison NeighborNestor  7/20.  TODAY'S SUMMARY:  70yo male with hx HTN, DM, chronic back pain s/p lumbar fusion 7/12 with post op hypoventilation and hypoxia.  Developed altered mental status, respiratory failure, and shock 7/16. Shock has resolved. Last doses of fentanyl and Versed were very early this morning the patient still with marketed sedation. Daughter updated at length at bedside. Transfusing 1 unit of packed red blood cells and continuing to hold further pain medication and sedation. Plan for extubation once mental status initially improves. After extubation I did inform daughter that Tylenol would be the only pain medication I would recommend safely using.  I have spent a total of 39 minutes of critical care time today caring for the patient, updating daughter at bedside, and reviewing the patient's electronic medical record.  Donna ChristenJennings E. Jamison NeighborNestor, M.D. [facility] Pager:  [phone]  After 3pm or if no response, call [phone] 12:45 PM 10/09/2015

## 2015-10-10 DIAGNOSIS — G9340 Encephalopathy, unspecified: Secondary | ICD-10-CM

## 2015-10-10 DIAGNOSIS — E876 Hypokalemia: Secondary | ICD-10-CM | POA: Diagnosis not present

## 2015-10-10 DIAGNOSIS — D62 Acute posthemorrhagic anemia: Secondary | ICD-10-CM | POA: Diagnosis not present

## 2015-10-10 DIAGNOSIS — M5136 Other intervertebral disc degeneration, lumbar region: Secondary | ICD-10-CM | POA: Diagnosis not present

## 2015-10-10 DIAGNOSIS — R410 Disorientation, unspecified: Secondary | ICD-10-CM

## 2015-10-10 DIAGNOSIS — I10 Essential (primary) hypertension: Secondary | ICD-10-CM

## 2015-10-10 DIAGNOSIS — J9601 Acute respiratory failure with hypoxia: Secondary | ICD-10-CM | POA: Diagnosis not present

## 2015-10-10 LAB — BASIC METABOLIC PANEL
Anion gap: 6 (ref 5–15)
BUN: 19 mg/dL (ref 6–20)
CHLORIDE: 116 mmol/L — AB (ref 101–111)
CO2: 24 mmol/L (ref 22–32)
Calcium: 8.8 mg/dL — ABNORMAL LOW (ref 8.9–10.3)
Creatinine, Ser: 0.85 mg/dL (ref 0.61–1.24)
GFR calc Af Amer: >60 mL/min (ref >60)
GFR calc non Af Amer: >60 mL/min (ref >60)
GLUCOSE: 116 mg/dL — AB (ref 65–99)
POTASSIUM: 3.2 mmol/L — AB (ref 3.5–5.1)
Sodium: 146 mmol/L — ABNORMAL HIGH (ref 135–145)

## 2015-10-10 LAB — CBC WITH DIFFERENTIAL/PLATELET
Basophils Absolute: 0 10*3/uL (ref 0.0–0.1)
Basophils Relative: 0 %
EOS PCT: 5 %
Eosinophils Absolute: 0.4 10*3/uL (ref 0.0–0.7)
HCT: 21.1 % — ABNORMAL LOW (ref 39.0–52.0)
Hemoglobin: 7.1 g/dL — ABNORMAL LOW (ref 13.0–17.0)
LYMPHS ABS: 1.6 10*3/uL (ref 0.7–4.0)
LYMPHS PCT: 22 %
MCH: 30.2 pg (ref 26.0–34.0)
MCHC: 33.6 g/dL (ref 30.0–36.0)
MCV: 89.8 fL (ref 78.0–100.0)
MONO ABS: 0.5 10*3/uL (ref 0.1–1.0)
Monocytes Relative: 8 %
Neutro Abs: 4.5 10*3/uL (ref 1.7–7.7)
Neutrophils Relative %: 65 %
PLATELETS: 269 10*3/uL (ref 150–400)
RBC: 2.35 MIL/uL — ABNORMAL LOW (ref 4.22–5.81)
RDW: 14.7 % (ref 11.5–15.5)
WBC: 7 10*3/uL (ref 4.0–10.5)

## 2015-10-10 LAB — CULTURE, BLOOD (ROUTINE X 2)
CULTURE: NO GROWTH
Culture: NO GROWTH

## 2015-10-10 LAB — RENAL FUNCTION PANEL
ANION GAP: 6 (ref 5–15)
Albumin: 2.1 g/dL — ABNORMAL LOW (ref 3.5–5.0)
BUN: 19 mg/dL (ref 6–20)
CHLORIDE: 117 mmol/L — AB (ref 101–111)
CO2: 23 mmol/L (ref 22–32)
Calcium: 8.8 mg/dL — ABNORMAL LOW (ref 8.9–10.3)
Creatinine, Ser: 0.84 mg/dL (ref 0.61–1.24)
GFR calc non Af Amer: >60 mL/min (ref >60)
Glucose, Bld: 116 mg/dL — ABNORMAL HIGH (ref 65–99)
Phosphorus: 2.9 mg/dL (ref 2.5–4.6)
Potassium: 3.3 mmol/L — ABNORMAL LOW (ref 3.5–5.1)
Sodium: 146 mmol/L — ABNORMAL HIGH (ref 135–145)

## 2015-10-10 LAB — MAGNESIUM: Magnesium: 1.8 mg/dL (ref 1.7–2.4)

## 2015-10-10 LAB — GLUCOSE, CAPILLARY
GLUCOSE-CAPILLARY: 114 mg/dL — AB (ref 65–99)
GLUCOSE-CAPILLARY: 108 mg/dL — AB (ref 65–99)
GLUCOSE-CAPILLARY: 123 mg/dL — AB (ref 65–99)
GLUCOSE-CAPILLARY: 156 mg/dL — AB (ref 65–99)
GLUCOSE-CAPILLARY: 122 mg/dL — AB (ref 65–99)
GLUCOSE-CAPILLARY: 125 mg/dL — AB (ref 65–99)

## 2015-10-10 MED ORDER — AMLODIPINE BESYLATE 5 MG PO TABS
5.0000 mg | ORAL_TABLET | Freq: Every day | ORAL | Status: DC
  Administered 2015-10-11 – 2015-10-15 (×4): 5 mg via ORAL
  Filled 2015-10-10 (×4): qty 1

## 2015-10-10 MED ORDER — POTASSIUM CHLORIDE 10 MEQ/50ML IV SOLN
10.0000 meq | INTRAVENOUS | Status: AC
  Administered 2015-10-10 (×4): 10 meq via INTRAVENOUS
  Filled 2015-10-10 (×4): qty 50

## 2015-10-10 MED ORDER — INSULIN ASPART 100 UNIT/ML ~~LOC~~ SOLN: 0.0000 [IU] | Freq: Three times a day (TID) | SUBCUTANEOUS | Status: DC

## 2015-10-10 MED ORDER — MAGNESIUM SULFATE 2 GM/50ML IV SOLN
2.0000 g | Freq: Once | INTRAVENOUS | Status: AC
  Administered 2015-10-10: 2 g via INTRAVENOUS
  Filled 2015-10-10: qty 50

## 2015-10-10 MED ORDER — ENSURE ENLIVE PO LIQD
237.0000 mL | Freq: Two times a day (BID) | ORAL | Status: DC
  Administered 2015-10-11 – 2015-10-15 (×5): 237 mL via ORAL

## 2015-10-10 MED ORDER — INSULIN ASPART 100 UNIT/ML ~~LOC~~ SOLN
0.0000 [IU] | Freq: Every day | SUBCUTANEOUS | Status: DC
Start: 2015-10-10 — End: 2015-10-15

## 2015-10-10 MED ORDER — TAMSULOSIN HCL 0.4 MG PO CAPS
0.4000 mg | ORAL_CAPSULE | Freq: Every day | ORAL | Status: DC
  Administered 2015-10-10 – 2015-10-15 (×5): 0.4 mg via ORAL
  Filled 2015-10-10 (×6): qty 1

## 2015-10-10 MED ORDER — POTASSIUM CHLORIDE 20 MEQ/15ML (10%) PO SOLN
20.0000 meq | Freq: Every day | ORAL | Status: DC
  Administered 2015-10-10 – 2015-10-12 (×3): 20 meq via ORAL
  Filled 2015-10-10 (×3): qty 15

## 2015-10-10 MED ORDER — INSULIN ASPART 100 UNIT/ML ~~LOC~~ SOLN
0.0000 [IU] | Freq: Three times a day (TID) | SUBCUTANEOUS | Status: DC
  Administered 2015-10-11 – 2015-10-13 (×3): 2 [IU] via SUBCUTANEOUS
  Administered 2015-10-14: 1 [IU] via SUBCUTANEOUS
  Administered 2015-10-15: 2 [IU] via SUBCUTANEOUS

## 2015-10-10 MED ORDER — FUROSEMIDE 20 MG PO TABS
20.0000 mg | ORAL_TABLET | Freq: Every day | ORAL | Status: DC
  Administered 2015-10-10 – 2015-10-15 (×5): 20 mg via ORAL
  Filled 2015-10-10 (×5): qty 1

## 2015-10-10 MED ORDER — SIMVASTATIN 40 MG PO TABS
40.0000 mg | ORAL_TABLET | Freq: Every day | ORAL | Status: DC
  Administered 2015-10-11 – 2015-10-15 (×4): 40 mg via ORAL
  Filled 2015-10-10 (×4): qty 1

## 2015-10-10 MED ORDER — AMLODIPINE BESYLATE 10 MG PO TABS
10.0000 mg | ORAL_TABLET | Freq: Every day | ORAL | Status: DC
  Administered 2015-10-10: 10 mg via ORAL
  Filled 2015-10-10: qty 1

## 2015-10-10 NOTE — Evaluation (Signed)
Physical Therapy Evaluation Patient Details Name: Danny Patterson MRN: [id number] DOB: 04/10/1945 Today's Date: 10/10/2015   History of Present Illness  Patient is a 70 yo male s/p Lumbar three-four, Lumbar four-five, Lumbar five-Sacrum one posterior lumbar interbody fusion with Laminotomy at Lumbar Two-Three complicated by post op dural tear/csf leak and then required intubation for respiratory insufficiency, hypoxia, shock, extubated 7/20.  Clinical Impression  Patient demonstrates deficits in functional mobility as indicated below. Will need continued skilled PT to address deficits and maximize function. Will see as indicated and progress as tolerated. At this time, patient with significant limitations in all aspects of function. Will need intense post acute rehabilitation, recommend CIR consult.    Follow Up Recommendations CIR;Supervision/Assistance - 24 hour    Equipment Recommendations  Rolling walker with 5" wheels;3in1 (PT)    Recommendations for Other Services Rehab consult     Precautions / Restrictions Precautions Precautions: Back Precaution Booklet Issued: No Precaution Comments: verbally reviewed with patient and family Required Braces or Orthoses: Spinal Brace Spinal Brace: Lumbar corset Restrictions Weight Bearing Restrictions: No      Mobility  Bed Mobility Overal bed mobility: Needs Assistance;+2 for physical assistance Bed Mobility: Rolling;Sidelying to Sit Rolling: Max assist;+2 for physical assistance Sidelying to sit: Total assist;+2 for physical assistance       General bed mobility comments: Max assist for rolling in bed with UE reaching for rail. Total assist to elevate trunk to upright and bring LEs off the EOB. increased pain noted with transitional movements  Transfers Overall transfer level: Needs assistance Equipment used:  (2 person face to face with gait belt and chuck pad) Transfers: Sit to/from Stand;Stand Pivot Transfers Sit to Stand:  Max assist;+2 physical assistance Stand pivot transfers: Max assist;+2 physical assistance       General transfer comment: Very heavy 2 person assist with significant reliance on chuck pad and gait belt, increased time, minimal ability to shuffle step to chair.  Ambulation/Gait             General Gait Details: unable to perform  Stairs            Wheelchair Mobility    Modified Rankin (Stroke Patients Only)       Balance Overall balance assessment: Needs assistance Sitting-balance support: Feet supported Sitting balance-Leahy Scale: Poor Sitting balance - Comments: fluctuation in assist from moderate to min at EOB with time     Standing balance-Leahy Scale: Zero                               Pertinent Vitals/Pain Pain Assessment: Faces Faces Pain Scale: Hurts even more Pain Location: RLE and back (incision site) Pain Descriptors / Indicators: Discomfort;Grimacing;Guarding;Operative site guarding Pain Intervention(s): Limited activity within patient's tolerance;Monitored during session;Repositioned    Home Living Family/patient expects to be discharged to:: Inpatient rehab Living Arrangements: Spouse/significant other Available Help at Discharge: Family                  Prior Function Level of Independence: Independent         Comments: was driving despite pain in right foot per chart review     Hand Dominance   Dominant Hand: Right    Extremity/Trunk Assessment               Lower Extremity Assessment: Generalized weakness;RLE deficits/detail;Difficult to assess due to impaired cognition         Communication  Cognition Arousal/Alertness: Awake/alert Behavior During Therapy: WFL for tasks assessed/performed Overall Cognitive Status: Impaired/Different from baseline Area of Impairment: Attention;Memory;Following commands;Safety/judgement;Awareness;Problem solving   Current Attention Level: Focused (easily  distracted by environment) Memory: Decreased recall of precautions;Decreased short-term memory Following Commands: Follows one step commands consistently;Follows one step commands with increased time Safety/Judgement: Decreased awareness of safety;Decreased awareness of deficits Awareness: Intellectual Problem Solving: Slow processing;Decreased initiation;Requires verbal cues;Requires tactile cues      General Comments      Exercises        Assessment/Plan    PT Assessment Patient needs continued PT services  PT Diagnosis Difficulty walking;Abnormality of gait;Generalized weakness;Acute pain;Altered mental status   PT Problem List Decreased strength;Decreased activity tolerance;Decreased balance;Decreased mobility;Decreased coordination;Decreased cognition;Decreased knowledge of use of DME;Decreased safety awareness;Decreased knowledge of precautions;Pain  PT Treatment Interventions DME instruction;Gait training;Stair training;Functional mobility training;Therapeutic activities;Therapeutic exercise;Balance training;Cognitive remediation;Patient/family education   PT Goals (Current goals can be found in the Care Plan section) Acute Rehab PT Goals Patient Stated Goal: to go home PT Goal Formulation: With patient/family Time For Goal Achievement: 10/24/15 Potential to Achieve Goals: Good    Frequency Min 5X/week   Barriers to discharge        Co-evaluation PT/OT/SLP Co-Evaluation/Treatment: Yes Reason for Co-Treatment: Complexity of the patient's impairments (multi-system involvement);Necessary to address cognition/behavior during functional activity;For patient/therapist safety PT goals addressed during session: Mobility/safety with mobility;Balance OT goals addressed during session: ADL's and self-care       End of Session Equipment Utilized During Treatment: Gait belt;Back brace Activity Tolerance: Patient limited by pain Patient left: in chair;with call bell/phone  within reach;with chair alarm set;with family/visitor present Nurse Communication: Mobility status;Need for lift equipment;Precautions;Weight bearing status         Time: [id number] PT Time Calculation (min) (ACUTE ONLY): 44 min   Charges:   PT Evaluation $PT Eval High Complexity: 1 Procedure     PT G CodesFabio Asa:        Niema Carrara J 10/10/2015, 10:16 AM Charlotte Crumbevon Brannan Cassedy, PT DPT  [phone]

## 2015-10-10 NOTE — Evaluation (Signed)
Occupational Therapy Evaluation Patient Details Name: Danny Patterson MRN: [id number] DOB: Jul 02, 1945 Today's Date: 10/10/2015    History of Present Illness Patient is a 70 yo male s/p Lumbar three-four, Lumbar four-five, Lumbar five-Sacrum one posterior lumbar interbody fusion with Laminotomy at Lumbar Two-Three complicated by post op dural tear/csf leak and then required intubation for respiratory insufficiency, hypoxia, shock, extubated 7/20.   Clinical Impression   Pt currently requires max assist +2 for basic transfers and ADL. Pt appears anxious of movement and c/o pain throughout session but agreeable to participate in therapy and engage in OOB activities. Recommending CIR level therapies for follow up in order to maximize independence and safety with ADL and functional mobility prior to return home. Pt would benefit from continued skilled OT to address established goals.    Follow Up Recommendations  CIR;Supervision/Assistance - 24 hour    Equipment Recommendations  Other (comment) (TBD at next venue)    Recommendations for Other Services Rehab consult     Precautions / Restrictions Precautions Precautions: Back Precaution Booklet Issued: No Precaution Comments: verbally reviewed with patient and family Required Braces or Orthoses: Spinal Brace Spinal Brace: Lumbar corset Restrictions Weight Bearing Restrictions: No      Mobility Bed Mobility Overal bed mobility: Needs Assistance;+2 for physical assistance Bed Mobility: Rolling;Sidelying to Sit Rolling: Max assist;+2 for physical assistance Sidelying to sit: Total assist;+2 for physical assistance       General bed mobility comments: Max assist for rolling in bed with UE reaching for rail. Total assist to elevate trunk to upright and bring LEs off the EOB. increased pain noted with transitional movements  Transfers Overall transfer level: Needs assistance Equipment used:  (2 person face to face with gait belt  and chuck pad) Transfers: Sit to/from Stand;Stand Pivot Transfers Sit to Stand: Max assist;+2 physical assistance Stand pivot transfers: Max assist;+2 physical assistance       General transfer comment: Very heavy 2 person assist with significant reliance on chuck pad and gait belt, increased time, minimal ability to shuffle step to chair.    Balance Overall balance assessment: Needs assistance Sitting-balance support: Feet supported Sitting balance-Leahy Scale: Poor Sitting balance - Comments: fluctuation in assist from moderate to min at EOB with time     Standing balance-Leahy Scale: Zero                              ADL Overall ADL's : Needs assistance/impaired Eating/Feeding: Set up;Sitting   Grooming: Minimal assistance;Sitting;Wash/dry face Grooming Details (indicate cue type and reason): Min assist for sitting balance Upper Body Bathing: Maximal assistance;Sitting   Lower Body Bathing: Maximal assistance;+2 for physical assistance;Sitting/lateral leans   Upper Body Dressing : Maximal assistance;Sitting Upper Body Dressing Details (indicate cue type and reason): to don hospital gown and brace Lower Body Dressing: Maximal assistance Lower Body Dressing Details (indicate cue type and reason): to don socks at [employer] Transfer: Maximal assistance;+2 for physical assistance;Stand-pivot;BSC Toilet Transfer Details (indicate cue type and reason): Simulated by stand pivot from EOB to chair Toileting- Clothing Manipulation and Hygiene: Total assistance       Functional mobility during ADLs: Maximal assistance;+2 for physical assistance (for stand pivot only) General ADL Comments: Pt very anxious about movement and requires a lot of verbal encouragement including relaxation techniques. Educated family on therapy expectations, potential need for rehab prior to return home; family verbalizes understanding.     Vision Additional Comments: Appears [facility]  but  needs further assessment.   Perception     Praxis      Pertinent Vitals/Pain Pain Assessment: Faces Faces Pain Scale: Hurts even more Pain Location: RLE and back (incision site) Pain Descriptors / Indicators: Discomfort;Grimacing;Guarding;Operative site guarding Pain Intervention(s): Limited activity within patient's tolerance;Monitored during session;Repositioned     Hand Dominance Right   Extremity/Trunk Assessment Upper Extremity Assessment Upper Extremity Assessment: Generalized weakness   Lower Extremity Assessment Lower Extremity Assessment: Generalized weakness;RLE deficits/detail;Difficult to assess due to impaired cognition RLE: Unable to fully assess due to pain   Cervical / Trunk Assessment Cervical / Trunk Assessment: Other exceptions Cervical / Trunk Exceptions: s/p lumbar sx   Communication Communication Communication: No difficulties   Cognition Arousal/Alertness: Awake/alert Behavior During Therapy: WFL for tasks assessed/performed Overall Cognitive Status: Impaired/Different from baseline Area of Impairment: Attention;Memory;Following commands;Safety/judgement;Awareness;Problem solving   Current Attention Level: Focused (easily distracted by environment) Memory: Decreased recall of precautions;Decreased short-term memory Following Commands: Follows one step commands consistently;Follows one step commands with increased time Safety/Judgement: Decreased awareness of safety;Decreased awareness of deficits Awareness: Intellectual Problem Solving: Slow processing;Decreased initiation;Requires verbal cues;Requires tactile cues     General Comments       Exercises       Shoulder Instructions      Home Living Family/patient expects to be discharged to:: Inpatient rehab Living Arrangements: Spouse/significant other Available Help at Discharge: Family                                    Prior Functioning/Environment Level of  Independence: Independent        Comments: was driving despite pain in right foot per chart review    OT Diagnosis: Generalized weakness;Acute pain;Altered mental status   OT Problem List: Decreased strength;Decreased range of motion;Decreased activity tolerance;Impaired balance (sitting and/or standing);Decreased coordination;Decreased cognition;Decreased safety awareness;Decreased knowledge of use of DME or AE;Decreased knowledge of precautions;Obesity;Impaired UE functional use;Pain   OT Treatment/Interventions: Self-care/ADL training;Therapeutic exercise;Energy conservation;DME and/or AE instruction;Therapeutic activities;Cognitive remediation/compensation;Patient/family education;Balance training    OT Goals(Current goals can be found in the care plan section) Acute Rehab OT Goals Patient Stated Goal: to go home OT Goal Formulation: With patient/family Time For Goal Achievement: 10/24/15 Potential to Achieve Goals: Good ADL Goals Pt Will Perform Grooming: with min assist;standing Pt Will Perform Lower Body Bathing: with min assist;with adaptive equipment;sit to/from stand Pt Will Perform Lower Body Dressing: with min assist;with adaptive equipment;sit to/from stand Pt Will Transfer to Toilet: with min assist;ambulating;bedside commode Pt Will Perform Toileting - Clothing Manipulation and hygiene: with min assist;with adaptive equipment;sit to/from stand Additional ADL Goal #1: Pt will don/doff back brace with set up as precursor for ADL and functional mobility. Additional ADL Goal #2: Pt will independently verbally recall 3/3 back precautions and maintain throughout ADL.  OT Frequency: Min 3X/week   Barriers to D/C:            Co-evaluation PT/OT/SLP Co-Evaluation/Treatment: Yes Reason for Co-Treatment: Complexity of the patient's impairments (multi-system involvement);Necessary to address cognition/behavior during functional activity;For patient/therapist safety PT goals  addressed during session: Mobility/safety with mobility;Balance OT goals addressed during session: ADL's and self-care      End of Session Equipment Utilized During Treatment: Back brace;Gait belt Nurse Communication: Mobility status;Need for lift equipment  Activity Tolerance: Patient limited by pain Patient left: in chair;with call bell/phone within reach;with chair alarm set;with family/visitor present   Time: [id number] OT Time  Calculation (min): 44 min Charges:  OT General Charges $OT Visit: 1 Procedure OT Evaluation $OT Eval High Complexity: 1 Procedure OT Treatments $Self Care/Home Management : 8-22 mins G-Codes:     Gaye Alken M.S., OTR/L Pager: [phone]  10/10/2015, 11:31 AM

## 2015-10-10 NOTE — Progress Notes (Signed)
Patient ID: Danny StacksGary L Patterson, male   DOB: 1945-07-27, 70 y.o.   MRN: [id number] Doing great, extubated. To the floor

## 2015-10-10 NOTE — Progress Notes (Signed)
Rehab Admissions Coordinator Note:  Patient was screened by Clois DupesBoyette, Deedee Lybarger Godwin for appropriateness for an Inpatient Acute Rehab Consult per PT recommendation.  At this time, we are recommending Inpatient Rehab consult.  Clois DupesBoyette, Dhruv Christina Godwin 10/10/2015, 11:06 AM  I can be reached at [phone].

## 2015-10-10 NOTE — Progress Notes (Signed)
Pt extubated successfully yesterday evening.  Plan transfer to medical floor later today upon bed availability.  PT/OT recommending CIR and consult in process.  Will continue to follow for discharge planning as pt progresses.    Quintella BatonJulie W. Gerilynn Mccullars, RN, BSN  Trauma/Neuro ICU Case Manager [phone]

## 2015-10-10 NOTE — Consult Note (Signed)
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 Jeral FruitBotero   HPI: Danny Patterson is a 70 y.o. right handed male with history of hypertension, diabetes mellitus. Patient lives with wife. Independent in driving prior to admission up until the last few months due to foot drop. 1 level home. Presented 10/01/2015 with low back pain radiating to the right lower extremity with foot drop that has progressed over the past 6 months. X-ray imaging showed degenerative disc disease lumbar spine L2-3, 3-4, 4-5 and L5-S1 with spondylolisthesis. Chronic radiculopathy. Underwent bilateral L3-4-5 laminectomy with lysis of adhesion. Discectomy at the level of L3-4, L5-S1. Pedicle screws from L3-S1. Bilateral facetectomy 10/02/2015 per Dr. Jeral FruitBotero. Back corset when out of bed. Hospital course pain management. Course complicated by respiratory insufficiency hypoxemia and shock requiring intubation. Followed by critical care pulmonary services and extubated 10/09/2015. Currently maintained on Zosyn for suspect pneumonia. Clear liquid diet and advance as tolerated. Physical therapy evaluation completed 10/10/2015 with recommendations of physical medicine rehabilitation consult.   Review of Systems  Constitutional: Negative for fever and chills.  HENT: Negative for hearing loss.   Eyes: Negative for blurred vision and double vision.  Respiratory: Negative for cough and shortness of breath.   Cardiovascular: Positive for leg swelling. Negative for chest pain and palpitations.  Gastrointestinal: Positive for constipation. Negative for nausea and vomiting.  Genitourinary: Positive for urgency and frequency. Negative for dysuria.  Musculoskeletal: Positive for myalgias and back pain.  Skin: Negative for rash.  Neurological: Negative for seizures and headaches.  All other systems reviewed and are  negative.  Past Medical History  Diagnosis Date  . Allergy   . Arthritis   . Hypertension   . Hyperlipidemia   . Rheumatic fever   . Kidney stone   . Diabetes mellitus without complication (HCC)   . Gout    Past Surgical History  Procedure Laterality Date  . Knot      removed from neck  . Back surgery    . Posterior lumbar fusion 4 level N/A 10/01/2015    Procedure: Lumbar three-four,  Lumbar four-five,  Lumbar five-Sacrum one posterior lumbar interbody fusion with Laminotomy at Lumbar Two-Three;  Surgeon: Hilda LiasErnesto Botero, MD;  Location: [facility];  Service: Neurosurgery;  Laterality: N/A;   Family History  Problem Relation Age of Onset  . Parkinson's disease Father [id number]  . Kidney disease Maternal Grandmother    Social History:  reports that he has never smoked. He has never used smokeless tobacco. He reports that he drinks about 1.8 oz of alcohol per week. He reports that he does not use illicit drugs. Allergies:  Allergies  Allergen Reactions  . Ace Inhibitors Other (See Comments)    Angioedema.   Medications Prior to Admission  Medication Sig Dispense Refill  . amLODipine (NORVASC) 10 MG tablet Take 10 mg by mouth daily.    Marland Kitchen. labetalol (NORMODYNE) 200 MG tablet Take 200 mg by mouth daily.     . magnesium gluconate (MAGONATE) 500 MG tablet Take 500 mg by mouth daily.    . metFORMIN (GLUCOPHAGE) 500 MG tablet Take 1 tablet each AM with breakfast. (Patient taking differently: Take 500 mg by mouth daily with breakfast. Take 1 tablet each AM with breakfast.) 90 tablet 3  . naproxen sodium (ANAPROX) 220 MG tablet Take 220 mg by mouth 2 (two) times daily as needed.     . colchicine 0.6 MG  tablet Take 0.6 mg by mouth as needed (3 on day one of flare then one daily after.).    Marland Kitchen furosemide (LASIX) 20 MG tablet Take 20 mg by mouth daily.    . simvastatin (ZOCOR) 40 MG tablet Take 40 mg by mouth daily.       Home: Home Living Family/patient expects to be discharged to::  Inpatient rehab Living Arrangements: Spouse/significant other Available Help at Discharge: Family  Functional History: Prior Function Level of Independence: Independent Comments: was driving despite pain in right foot per chart review Functional Status:  Mobility: Bed Mobility Overal bed mobility: Needs Assistance, +2 for physical assistance Bed Mobility: Rolling, Sidelying to Sit Rolling: Max assist, +2 for physical assistance Sidelying to sit: Total assist, +2 for physical assistance General bed mobility comments: Max assist for rolling in bed with UE reaching for rail. Total assist to elevate trunk to upright and bring LEs off the EOB. increased pain noted with transitional movements Transfers Overall transfer level: Needs assistance Equipment used:  (2 person face to face with gait belt and chuck pad) Transfers: Sit to/from Stand, Stand Pivot Transfers Sit to Stand: Max assist, +2 physical assistance Stand pivot transfers: Max assist, +2 physical assistance General transfer comment: Very heavy 2 person assist with significant reliance on chuck pad and gait belt, increased time, minimal ability to shuffle step to chair. Ambulation/Gait General Gait Details: unable to perform    ADL:    Cognition: Cognition Overall Cognitive Status: Impaired/Different from baseline Orientation Level: Disoriented to place, Disoriented to time, Disoriented to situation, Oriented to person Cognition Arousal/Alertness: Awake/alert Behavior During Therapy: WFL for tasks assessed/performed Overall Cognitive Status: Impaired/Different from baseline Area of Impairment: Attention, Memory, Following commands, Safety/judgement, Awareness, Problem solving Current Attention Level: Focused (easily distracted by environment) Memory: Decreased recall of precautions, Decreased short-term memory Following Commands: Follows one step commands consistently, Follows one step commands with increased  time Safety/Judgement: Decreased awareness of safety, Decreased awareness of deficits Awareness: Intellectual Problem Solving: Slow processing, Decreased initiation, Requires verbal cues, Requires tactile cues  Blood pressure 99/83, pulse 72, temperature 98.9 F (37.2 C), temperature source Axillary, resp. rate 23, height 5\' 10"  (1.778 m), weight 105 kg (231 lb 7.7 oz), SpO2 96 %. Physical Exam  Vitals reviewed. HENT:  Head: Normocephalic.  Neck: Normal range of motion. Neck supple. No thyromegaly present.  Cardiovascular: Normal rate and regular rhythm.   Respiratory:  Decreased breath sounds at the bases but clear to auscultation  GI: Soft. Bowel sounds are normal. He exhibits no distension.  Musculoskeletal:  Redness, warmth medial right ankle--1+ edema also  Neurological:  Lethargic but arousable. He does follow commands. Provides name age in place. Moves all 4 limb but right lower extremity appears more limited.  Skin:  Back incision is dressed  Psychiatric:  Confused. disoriented    Results for orders placed or performed during the hospital encounter of 10/01/15 (from the past 24 hour(s))  Glucose, capillary     Status: Abnormal   Collection Time: 10/09/15 12:02 PM  Result Value Ref Range   Glucose-Capillary 114 (H) 65 - 99 mg/dL  Glucose, capillary     Status: Abnormal   Collection Time: 10/09/15  3:31 PM  Result Value Ref Range   Glucose-Capillary 116 (H) 65 - 99 mg/dL  Glucose, capillary     Status: Abnormal   Collection Time: 10/09/15  7:38 PM  Result Value Ref Range   Glucose-Capillary 108 (H) 65 - 99 mg/dL  Glucose, capillary  Status: Abnormal   Collection Time: 10/09/15 11:39 PM  Result Value Ref Range   Glucose-Capillary 118 (H) 65 - 99 mg/dL  Glucose, capillary     Status: Abnormal   Collection Time: 10/10/15  3:51 AM  Result Value Ref Range   Glucose-Capillary 114 (H) 65 - 99 mg/dL  Basic metabolic panel     Status: Abnormal   Collection Time:  10/10/15  5:45 AM  Result Value Ref Range   Sodium 146 (H) 135 - 145 mmol/L   Potassium 3.2 (L) 3.5 - 5.1 mmol/L   Chloride 116 (H) 101 - 111 mmol/L   CO2 24 22 - 32 mmol/L   Glucose, Bld 116 (H) 65 - 99 mg/dL   BUN 19 6 - 20 mg/dL   Creatinine, Ser 1.61 0.61 - 1.24 mg/dL   Calcium 8.8 (L) 8.9 - 10.3 mg/dL   GFR calc non Af Amer >60 >60 mL/min   GFR calc Af Amer >60 >60 mL/min   Anion gap 6 5 - 15  CBC with Differential/Platelet     Status: Abnormal   Collection Time: 10/10/15  5:45 AM  Result Value Ref Range   WBC 7.0 4.0 - 10.5 K/uL   RBC 2.35 (L) 4.22 - 5.81 MIL/uL   Hemoglobin 7.1 (L) 13.0 - 17.0 g/dL   HCT 09.6 (L) 04.5 - 40.9 %   MCV 89.8 78.0 - 100.0 fL   MCH 30.2 26.0 - 34.0 pg   MCHC 33.6 30.0 - 36.0 g/dL   RDW 81.1 91.4 - 78.2 %   Platelets 269 150 - 400 K/uL   Neutrophils Relative % 65 %   Neutro Abs 4.5 1.7 - 7.7 K/uL   Lymphocytes Relative 22 %   Lymphs Abs 1.6 0.7 - 4.0 K/uL   Monocytes Relative 8 %   Monocytes Absolute 0.5 0.1 - 1.0 K/uL   Eosinophils Relative 5 %   Eosinophils Absolute 0.4 0.0 - 0.7 K/uL   Basophils Relative 0 %   Basophils Absolute 0.0 0.0 - 0.1 K/uL  Renal function panel     Status: Abnormal   Collection Time: 10/10/15  5:45 AM  Result Value Ref Range   Sodium 146 (H) 135 - 145 mmol/L   Potassium 3.3 (L) 3.5 - 5.1 mmol/L   Chloride 117 (H) 101 - 111 mmol/L   CO2 23 22 - 32 mmol/L   Glucose, Bld 116 (H) 65 - 99 mg/dL   BUN 19 6 - 20 mg/dL   Creatinine, Ser 9.56 0.61 - 1.24 mg/dL   Calcium 8.8 (L) 8.9 - 10.3 mg/dL   Phosphorus 2.9 2.5 - 4.6 mg/dL   Albumin 2.1 (L) 3.5 - 5.0 g/dL   GFR calc non Af Amer >60 >60 mL/min   GFR calc Af Amer >60 >60 mL/min   Anion gap 6 5 - 15  Magnesium     Status: None   Collection Time: 10/10/15  5:45 AM  Result Value Ref Range   Magnesium 1.8 1.7 - 2.4 mg/dL  Glucose, capillary     Status: Abnormal   Collection Time: 10/10/15  7:57 AM  Result Value Ref Range   Glucose-Capillary 108 (H) 65 - 99  mg/dL   No results found.  Assessment/Plan: Diagnosis: lumbar spondylolisthesis/radiculopathy with post-op respiratory failure/encephalopathy 1. Does the need for close, 24 hr/day medical supervision in concert with the patient's rehab needs make it unreasonable for this patient to be served in a less intensive setting? Yes 2. Co-Morbidities requiring supervision/potential complications:  dm, gout right ankle/foot 3. Due to bladder management, bowel management, safety, skin/wound care, disease management, medication administration, pain management and patient education, does the patient require 24 hr/day rehab nursing? Yes 4. Does the patient require coordinated care of a physician, rehab nurse, PT (1-2 hrs/day, 5 days/week) and OT (1-2 hrs/day, 5 days/week) to address physical and functional deficits in the context of the above medical diagnosis(es)? Yes Addressing deficits in the following areas: balance, endurance, locomotion, strength, transferring, bowel/bladder control, bathing, dressing, feeding, grooming, toileting, cognition and psychosocial support 5. Can the patient actively participate in an intensive therapy program of at least 3 hrs of therapy per day at least 5 days per week? Yes and Potentially 6. The potential for patient to make measurable gains while on inpatient rehab is excellent 7. Anticipated functional outcomes upon discharge from inpatient rehab are modified independent  with PT, modified independent and supervision with OT, n/a with SLP. 8. Estimated rehab length of stay to reach the above functional goals is: 10-15 days 9. Does the patient have adequate social supports and living environment to accommodate these discharge functional goals? Yes 10. Anticipated D/C setting: Home 11. Anticipated post D/C treatments: HH therapy and Outpatient therapy 12. Overall Rehab/Functional Prognosis: excellent  RECOMMENDATIONS: This patient's condition is appropriate for continued  rehabilitative care in the following setting: CIR Patient has agreed to participate in recommended program. Yes Note that insurance prior authorization may be required for reimbursement for recommended care.  Comment: Rehab Admissions Coordinator to follow up. Spoke with daughter at [employer],  Ranelle Oyster, MD, [place]     10/10/2015

## 2015-10-10 NOTE — Progress Notes (Signed)
[facility] Nursing ICU Electrolyte Replacement Protocol  Patient Name: Danny StacksGary L Patterson DOB: [birth year] MRN: [id number]  Date of Service  10/10/2015   HPI/Events of Note    Recent Labs Lab 10/06/15 0446  10/06/15 1718 10/07/15 0458 10/07/15 1722 10/07/15 1949 10/08/15 0358 10/09/15 0224 10/09/15 0225 10/10/15 0545  NA 139  --   --  141  --   --  141  --  143 146*  146*  K 3.3*  --   --  3.1*  --   --  3.2*  --  3.9 3.2*  3.3*  CL 111  --   --  112*  --   --  116*  --  120* 116*  117*  CO2 17*  --   --  20*  --   --  20*  --  20* 24  23  GLUCOSE 137*  --   --  163*  --   --  152*  --  139* 116*  116*  BUN 14  --   --  15  --   --  23*  --  21* 19  19  CREATININE 1.04  --   --  0.77  --   --  0.86  --  0.91 0.85  0.84  CALCIUM 8.9  --   --  8.5*  --   --  8.4*  --  8.7* 8.8*  8.8*  MG  --   < > 1.7 1.6* 1.9  --   --  1.9  --  1.8  PHOS  --   < > 2.5 1.5* 2.6 2.9  --   --  3.4 2.9  < > = values in this interval not displayed.  Estimated Creatinine Clearance: 98.1 mL/min (by C-G formula based on Cr of 0.85).  Intake/Output      07/20 0701 - 07/21 0700   I.V. (mL/kg) 1903.7 (18.1)   Blood 330   IV Piggyback 150   Total Intake(mL/kg) 2383.7 (22.7)   Urine (mL/kg/hr) 3400 (1.3)   Total Output 3400   Net -1016.3        - I/O DETAILED x24h    Total I/O In: 750 [I.V.:700; IV Piggyback:50] Out: 1400 [Urine:1400] - I/O THIS SHIFT    ASSESSMENT   eICURN Interventions  K+ and Mg replaced using ICU protocol.    ASSESSMENT: MAJOR ELECTROLYTE    Danny Patterson, Danny Patterson Danny Patterson 10/10/2015, 6:36 AM

## 2015-10-10 NOTE — Progress Notes (Signed)
Nutrition Follow-up  DOCUMENTATION CODES:   Obesity unspecified  INTERVENTION:  Ensure Enlive po BID, each supplement provides 350 kcal and 20 grams of protein  NUTRITION DIAGNOSIS:   Inadequate oral intake related to  (decreased appetite) as evidenced by per patient/family report. Ongoing.   GOAL:   Patient will meet greater than or equal to 90% of their needs Progressing.   MONITOR:   PO intake, Supplement acceptance, I & O's, Labs  ASSESSMENT:   70yo male with hx HTN, DM, chronic back pain s/p lumbar fusion 7/12 with post op hypoventilation and hypoxia. Developed altered mental status, respiratory failure, and shock 7/16.  7/20 extubated 7/21 diet advanced Per pt and family appetite not great. Willing to try ensure to get strength back. Plan for CIR.   Medications reviewed and include: lasix, KCl Labs reviewed: Na 146, K+ 3.2 CBG's: 108-123   Diet Order:  Diet regular Room service appropriate?: Yes; Fluid consistency:: Thin  Skin:  Reviewed, no issues (back incision)  Last BM:  7/21  Height:   Ht Readings from Last 1 Encounters:  10/06/15 5\' 10"  (1.778 m)    Weight:   Wt Readings from Last 1 Encounters:  10/10/15 231 lb 7.7 oz (105 kg)    Ideal Body Weight:  75.4 kg  BMI:  Body mass index is 33.21 kg/(m^2).  Estimated Nutritional Needs:   Kcal:  1900-2100  Protein:  105-115 grams  Fluid:  > 1.9 L/day  EDUCATION NEEDS:   No education needs identified at this time  Kendell BaneHeather Darcey Demma RD, LDN, CNSC [phone] Pager [phone] After Hours Pager

## 2015-10-10 NOTE — Progress Notes (Signed)
PULMONARY / CRITICAL CARE MEDICINE   Name: Danny Patterson MRN: [id number] DOB: 07/08/1945    ADMISSION DATE:  10/01/2015 CONSULTATION DATE:  10/01/2015  REFERRING MD:  Jeral Fruit   CHIEF COMPLAINT:  Hypoxia   SUBJECTIVE:  Feeling stronger  REVIEW OF SYSTEMS: feels weak, no distress.   VITAL SIGNS: BP 99/83 mmHg  Pulse 72  Temp(Src) 98.9 F (37.2 C) (Axillary)  Resp 23  Ht  (1.778 m)  Wt 231 lb 7.7 oz (105 kg)  BMI 33.21 kg/m2  SpO2 96% Room air  HEMODYNAMICS: CVP:  [2 mmHg-16 mmHg] 2 mmHg  VENTILATOR SETTINGS: Vent Mode:  [-] PRVC FiO2 (%):  [40 %] 40 % Set Rate:  [22 bmp] 22 bmp Vt Set:  [580 mL] 580 mL PEEP:  [5 cmH20] 5 cmH20 Pressure Support:  [5 cmH20] 5 cmH20 Plateau Pressure:  [17 cmH20] 17 cmH20  INTAKE / OUTPUT: I/O last 3 completed shifts: In: 4136.8 [I.V.:3606.8; Blood:330; IV Piggyback:200] Out: 4025 [Urine:4025]  PHYSICAL EXAMINATION: General: No distress. Very weak, needing 2 assist to get him OOB.  Neuro: Awake, still a little confused but easily oriented. Moves all ext, he is very weak.  HEENT: NCAT,  No scleral icterus or injection. Cardiac: Regular rhythm & rate. No edema. No appreciable JVD. Pulmonary: faint basilar crackles. No accessory use  Abdomen: Soft. Protuberant. Normal bowel sounds. Integument: Warm and dry. No rash on exposed skin.  LABS:  BMET  Recent Labs Lab 10/08/15 0358 10/09/15 0225 10/10/15 0545  NA 141 143 146*  146*  K 3.2* 3.9 3.2*  3.3*  CL 116* 120* 116*  117*  CO2 20* 20* 24  23  BUN 23* 21* 19  19  CREATININE 0.86 0.91 0.85  0.84  GLUCOSE 152* 139* 116*  116*    Electrolytes  Recent Labs Lab 10/07/15 1722 10/07/15 1949 10/08/15 0358 10/09/15 0224 10/09/15 0225 10/10/15 0545  CALCIUM  --   --  8.4*  --  8.7* 8.8*  8.8*  MG 1.9  --   --  1.9  --  1.8  PHOS 2.6 2.9  --   --  3.4 2.9    CBC  Recent Labs Lab 10/08/15 0358 10/09/15 0224 10/09/15 0500 10/10/15 0545  WBC 7.4 8.0   --  7.0  HGB 7.4* 6.8* 6.5* 7.1*  HCT 22.0* 20.8* 19.0* 21.1*  PLT 253 237  --  269    Coag's No results for input(s): APTT, INR in the last 168 hours.  Sepsis Markers  Recent Labs Lab 10/05/15 1610 10/05/15 1700 10/06/15 0446 10/07/15 0458  LATICACIDVEN  --  0.7  --   --   PROCALCITON <0.10  --  0.13 0.11    ABG  Recent Labs Lab 10/05/15 1810 10/06/15 0435  PHART 7.308* 7.422  PCO2ART 43.8 25.2*  PO2ART 250* 170*    Liver Enzymes  Recent Labs Lab 10/05/15 1610 10/07/15 0458 10/09/15 0225 10/10/15 0545  AST 22  --   --   --   ALT 14*  --   --   --   ALKPHOS 39  --   --   --   BILITOT 0.7  --   --   --   ALBUMIN 2.4* 2.3* 2.1* 2.1*    Cardiac Enzymes  Recent Labs Lab 10/07/15 0953 10/08/15 0934 10/09/15 0953  TROPONINI 0.23* 0.07* 0.04*    Glucose  Recent Labs Lab 10/09/15 1202 10/09/15 1531 10/09/15 1938 10/09/15 2339 10/10/15 0351 10/10/15 0757  GLUCAP 114* 116* 108* 118* 114* 108*    Imaging No results found.   STUDIES:  CTA Chest 7/16: 1. No evidence of pulmonary embolism.  2. Moderate lower lobe atelectasis with small layering pleural effusions. CT Head W/O 7/16:  No acute abnormality. TTE 7/16:  Poor quality. Port CXR 7/17:  ETT in good position. Questionable left basilar opacity.  Port CXR 7/19:  Endotracheal tube and central venous catheter in good position. Persistent left basilar opacity. Slight rotation left. TEE 7/19: LVEF 55-60%. No evidence of thrombus. Trivial aortic regurgitation. No regional wall motion abnormalities.  MICROBIOLOGY: MRSA PCR 7/5:  Negative Blood Ctx x2 7/16>>> Tracheal Asp Ctx 7/16:  Oral flora.  ANTIBIOTICS: Vancomycin 7/16 - 7/18 Zosyn 7/16>>>  SIGNIFICANT EVENTS: 7/12 OR for lumbar fusion  7/16 Altered mental status, shock, VDRF  LINES/TUBES: OETT 8.0 7/16 - 7/21 LUMBAR DRAIN 7/12 - ??? FOLEY 7/12 - 7/19 L IJ CVL 7/16>>> PIV x1  ASSESSMENT / PLAN:   Acute Encephalopathy -  Metabolic vs hypercarbia w/ excessive sedation. This is slowly improving but he is not at baseline as of yet.  Pain Control Post-op - Extensive lumbar fusion 7/12. Plan Tylenol for pain management  Avoid narcotics   Severe physical deconditioning-->he was a max two person transfer from bed to chair the am of 7/21 Plan PT/OT May need SNF  Possible Aspiration Pneumonia vs HCAP Plan:   Zosyn Day #6 of 7 Trending PCT per algorithm  Elevated Troponin I - Possible demand ischemia. Resolving. H/O HTN H/O Hyperlipidemia Plan Resume lasix and norvasc per home  Cont statin   Urinary retention-->requring I&O cath Hypokalemia, hypernatremia and hyperchloremia  Plan   Start flomax Monitoring electrolytes & renal function daily  Replacing electrolytes as indicate  Anemia - No signs of active bleeding. Mild.  Leukocytosis - Resolved. Plan Trending cell counts daily w/ CBC Trending fever curve SCDs  Type 2 Diabetes  Plan:   Hold home metformin  Accu-checks q4hr SSI per Resistant Algorithm   FAMILY UPDATES:  Daughter updated at bedside by Dr. Jamison NeighborNestor 7/21.  TODAY'S SUMMARY:    70yo male with hx HTN, DM, chronic back pain s/p lumbar fusion 7/12 with post op hypoventilation and hypoxia.  Developed altered mental status, respiratory failure, and shock 7/16. Shock has resolved. Now on room air. He is very debilitated and weak. Plan for today is to push rehab efforts, advance diet and manage pain w/ tylenol only. He does have some residual urinary retention. Will start flomax for this. He can go the the med-surg floor from our stand-point. PCCM will be available as needed while he remains in ICU and we will sign off after transfer.  Simonne MartinetPeter E Babcock ACNP-BC [facility] Pulmonary/Critical Care Pager # [phone] OR # [phone] if no answer  9:29 AM 10/10/2015  PCCM Attending Note: Patient seen and examined with nurse practitioner. Please refer to his progress note which I have reviewed in  detail. Patient denies any chest pain or pressure. He denies any difficulty breathing. Denies any nausea. Patient is delirious and unable to answer appropriate orientation questions. Patient denies any back pain whatsoever at this time.  Review of Systems:  Unable to obtain due to delirium.  BP 99/83 mmHg  Pulse 72  Temp(Src) 98.9 F (37.2 C) (Axillary)  Resp 23  Ht 5\' 10"  (1.778 m)  Wt 231 lb 7.7 oz (105 kg)  BMI 33.21 kg/m2  SpO2 96% General:  Awake. No acute distress. Family at bedside. Just transitioned from  bedside chair back to bed. Integument:  Warm & dry. No rash on exposed skin.  HEENT:  Moist mucus membranes. No oral ulcers. No scleral injection or icterus.  Cardiovascular:  Regular rate. No edema. No appreciable JVD.  Pulmonary:  Good aeration & clear to auscultation bilaterally. Normal work of breathing on room air. Abdomen: Soft. Normal bowel sounds. Nondistended. Grossly nontender. Neurological: Moving all 4 extremities equally. Cranial nerves intact. Following commands. Not oriented to place, year, or president.   CBC Latest Ref Rng 10/10/2015 10/09/2015 10/09/2015  WBC 4.0 - 10.5 K/uL 7.0 - 8.0  Hemoglobin 13.0 - 17.0 g/dL 7.1(L) 6.5(LL) 6.8(LL)  Hematocrit 39.0 - 52.0 % 21.1(L) 19.0(L) 20.8(L)  Platelets 150 - 400 K/uL 269 - 237    BMP Latest Ref Rng 10/10/2015 10/10/2015 10/09/2015  Glucose 65 - 99 mg/dL 956(O) 130(Q) 657(Q)  BUN 6 - 20 mg/dL 19 19 46(N)  Creatinine 0.61 - 1.24 mg/dL 6.29 5.28 4.13  Sodium 135 - 145 mmol/L 146(H) 146(H) 143  Potassium 3.5 - 5.1 mmol/L 3.3(L) 3.2(L) 3.9  Chloride 101 - 111 mmol/L 117(H) 116(H) 120(H)  CO2 22 - 32 mmol/L 23 24 20(L)  Calcium 8.9 - 10.3 mg/dL 2.4(M) 0.1(U) 2.7(O)    A/P:  70 year old male with history of back pain status post lumbar fusion on 7/12 with emergent reintubation due to hypoventilation and hypoxia in the setting of altered mental status that was medication induced.Patient more cooperative this morning.  Currently on room air. Spent a significant amount time this morning with family at bedside discussing the natural pathology and waxing and waning nature of delirium. Blood glucose with diabetes seems to be well-controlled at this time.   1. Acute Hypoxic Respiratory Failure: Status post extubation Resolved.  2. Delirium: Multifactorial. Recommend continuing to avoid any sedating medications. 3. Post-operative Pain: Recommend Tylenol for relief of pain. Toradol IV prn. Could consider use of Ultram which she has tolerated before but would recommend against any narcotic or sedative medications. 4. Aspiration Pneumonia: Currently on Zosyn day #6/7. Recommend discontinuing tomorrow. 5. Hypokalemia: KCl 10 mg IV 4 runs. Plan to repeat electrolyte panel in a.m. 6. Hypernatremia:  Mild. Recommend continuing to trend electrolytes daily. 7. Hypertension: Restarting Norvasc at lower dose (5mg ) & Lasix daily. 8. Hyperlipidemia: Continuing Zocor. 9. Diabetes Mellitus Type 2:  Holding Metformin. Switching accu-checks to qAC & HS w/ SSI per sensitive algorithm. 10. Urinary Retention: Starting Flomax. Continuing to hold on replacing Foley catheter. 11. Anemia:  S/P 1u PRBC on 7/20. Improved. No signs of active bleeding. 12. Shock: Resolved. Likely medication induced. 13. Prophylaxis: SCDs. 14. Diet: Clear liquid diet. Advance as tolerated. 15. Disposition:  Per Neurosurgery. Patient can likely transition out of the intensive care unit today but will defer to Neurosurgery. PT/OT Consulted.  Donna Christen Jamison Neighbor, M.D. [facility] Pulmonary & Critical Care Pager:  [phone] After 3pm or if no response, call [phone] 11:02 AM 10/10/2015

## 2015-10-11 ENCOUNTER — Inpatient Hospital Stay (HOSPITAL_COMMUNITY): Payer: PPO

## 2015-10-11 DIAGNOSIS — R05 Cough: Secondary | ICD-10-CM | POA: Diagnosis not present

## 2015-10-11 DIAGNOSIS — R339 Retention of urine, unspecified: Secondary | ICD-10-CM | POA: Diagnosis not present

## 2015-10-11 DIAGNOSIS — E1310 Other specified diabetes mellitus with ketoacidosis without coma: Secondary | ICD-10-CM

## 2015-10-11 DIAGNOSIS — E083513 Diabetes mellitus due to underlying condition with proliferative diabetic retinopathy with macular edema, bilateral: Secondary | ICD-10-CM | POA: Diagnosis not present

## 2015-10-11 DIAGNOSIS — E785 Hyperlipidemia, unspecified: Secondary | ICD-10-CM

## 2015-10-11 DIAGNOSIS — Z794 Long term (current) use of insulin: Secondary | ICD-10-CM | POA: Diagnosis not present

## 2015-10-11 DIAGNOSIS — D649 Anemia, unspecified: Secondary | ICD-10-CM | POA: Diagnosis not present

## 2015-10-11 DIAGNOSIS — R410 Disorientation, unspecified: Secondary | ICD-10-CM | POA: Diagnosis not present

## 2015-10-11 DIAGNOSIS — J9601 Acute respiratory failure with hypoxia: Secondary | ICD-10-CM | POA: Diagnosis not present

## 2015-10-11 LAB — RENAL FUNCTION PANEL
Albumin: 2.2 g/dL — ABNORMAL LOW (ref 3.5–5.0)
Anion gap: 7 (ref 5–15)
BUN: 14 mg/dL (ref 6–20)
CHLORIDE: 111 mmol/L (ref 101–111)
CO2: 25 mmol/L (ref 22–32)
CREATININE: 0.8 mg/dL (ref 0.61–1.24)
Calcium: 8.9 mg/dL (ref 8.9–10.3)
GFR calc Af Amer: >60 mL/min (ref >60)
GFR calc non Af Amer: >60 mL/min (ref >60)
Glucose, Bld: 110 mg/dL — ABNORMAL HIGH (ref 65–99)
Phosphorus: 3.3 mg/dL (ref 2.5–4.6)
Potassium: 3.2 mmol/L — ABNORMAL LOW (ref 3.5–5.1)
Sodium: 143 mmol/L (ref 135–145)

## 2015-10-11 LAB — CBC WITH DIFFERENTIAL/PLATELET
Basophils Absolute: 0 10*3/uL (ref 0.0–0.1)
Basophils Relative: 0 %
EOS PCT: 4 %
Eosinophils Absolute: 0.3 10*3/uL (ref 0.0–0.7)
HCT: 20.7 % — ABNORMAL LOW (ref 39.0–52.0)
Hemoglobin: 7 g/dL — ABNORMAL LOW (ref 13.0–17.0)
LYMPHS ABS: 1.6 10*3/uL (ref 0.7–4.0)
LYMPHS PCT: 23 %
MCH: 30.2 pg (ref 26.0–34.0)
MCHC: 33.8 g/dL (ref 30.0–36.0)
MCV: 89.2 fL (ref 78.0–100.0)
MONO ABS: 0.5 10*3/uL (ref 0.1–1.0)
MONOS PCT: 7 %
Neutro Abs: 4.6 10*3/uL (ref 1.7–7.7)
Neutrophils Relative %: 66 %
PLATELETS: 300 10*3/uL (ref 150–400)
RBC: 2.32 MIL/uL — ABNORMAL LOW (ref 4.22–5.81)
RDW: 14.2 % (ref 11.5–15.5)
WBC: 7 10*3/uL (ref 4.0–10.5)

## 2015-10-11 LAB — PREPARE RBC (CROSSMATCH)

## 2015-10-11 LAB — GLUCOSE, CAPILLARY
Glucose-Capillary: 103 mg/dL — ABNORMAL HIGH (ref 65–99)
Glucose-Capillary: 123 mg/dL — ABNORMAL HIGH (ref 65–99)
Glucose-Capillary: 195 mg/dL — ABNORMAL HIGH (ref 65–99)
Glucose-Capillary: 126 mg/dL — ABNORMAL HIGH (ref 65–99)

## 2015-10-11 LAB — OCCULT BLOOD X 1 CARD TO LAB, STOOL: Fecal Occult Bld: NEGATIVE

## 2015-10-11 LAB — MAGNESIUM: Magnesium: 1.8 mg/dL (ref 1.7–2.4)

## 2015-10-11 MED ORDER — SODIUM CHLORIDE 0.9% FLUSH
10.0000 mL | INTRAVENOUS | Status: DC | PRN
  Administered 2015-10-12: 30 mL
  Administered 2015-10-12: 20 mL
  Administered 2015-10-13: 30 mL
  Administered 2015-10-13: 20 mL
  Administered 2015-10-14: 10 mL
  Administered 2015-10-14 (×2): 20 mL
  Administered 2015-10-14: 10 mL
  Administered 2015-10-15: 30 mL
  Filled 2015-10-11 (×9): qty 40

## 2015-10-11 MED ORDER — BENZONATATE 100 MG PO CAPS: 100.0000 mg | ORAL_CAPSULE | Freq: Three times a day (TID) | ORAL | Status: DC | PRN

## 2015-10-11 MED ORDER — SODIUM CHLORIDE 0.9 % IV SOLN
Freq: Once | INTRAVENOUS | Status: AC
  Administered 2015-10-11: 10:00:00 via INTRAVENOUS

## 2015-10-11 MED ORDER — SODIUM CHLORIDE 0.9% FLUSH
10.0000 mL | Freq: Two times a day (BID) | INTRAVENOUS | Status: DC
Start: 2015-10-11 — End: 2015-10-15
  Administered 2015-10-11: 20 mL
  Administered 2015-10-13 – 2015-10-15 (×4): 10 mL

## 2015-10-11 NOTE — Clinical Social Work Placement (Signed)
   CLINICAL SOCIAL WORK PLACEMENT  NOTE  Date:  10/11/2015  Patient Details  Name: Danny Patterson MRN: [id number] Date of Birth: [birth year]  Clinical Social Work is seeking post-discharge placement for this patient at the Skilled  Nursing Facility level of care (*CSW will initial, date and re-position this form in  chart as items are completed):      Patient/family provided with [facility] Clinical Social Work Department's list of facilities offering this level of care within the geographic area requested by the patient (or if unable, by the patient's family).      Patient/family informed of their freedom to choose among providers that offer the needed level of care, that participate in Medicare, Medicaid or managed care program needed by the patient, have an available bed and are willing to accept the patient.      Patient/family informed of [facility]'s ownership interest in [facility] and [facility], as well as of the fact that they are under no obligation to receive care at these facilities.  PASRR submitted to EDS on 10/11/15     PASRR number received on 10/11/15     Existing PASRR number confirmed on       FL2 transmitted to all facilities in geographic area requested by pt/family on 10/11/15     FL2 transmitted to all facilities within larger geographic area on       Patient informed that his/her managed care company has contracts with or will negotiate with certain facilities, including the following:            Patient/family informed of bed offers received.  Patient chooses bed at       Physician recommends and patient chooses bed at      Patient to be transferred to   on  .  Patient to be transferred to facility by       Patient family notified on   of transfer.  Name of family member notified:        PHYSICIAN Please sign FL2     Additional Comment:    _______________________________________________ Mearl Latin, LCSWA 10/11/2015, 2:47 PM

## 2015-10-11 NOTE — Clinical Social Work Note (Signed)
Clinical Social Work Assessment  Patient Details  Name: Danny Patterson MRN: [id number] Date of Birth: Dec 24, 1945  Date of referral:  10/11/15               Reason for consult:  Facility Placement                Permission sought to share information with:  Family Supports, Magazine features editor Permission granted to share information::  No (Patient is disoriented; completed assessment with spouse and daughter)  Name::     Customer service manager::  SNFs  Relationship::  Spouse/Dtr  Contact Information:  [phone]  Housing/Transportation Living arrangements for the past 2 months:  Single Family Home Source of Information:  Adult Children, Spouse Patient Interpreter Needed:  None Criminal Activity/Legal Involvement Pertinent to Current Situation/Hospitalization:  No - Comment as needed Significant Relationships:  Adult Children, Spouse Lives with:  Spouse Do you feel safe going back to the place where you live?  No Need for family participation in patient care:  Yes (Comment)  Care giving concerns:  CSW received referral for possible SNF placement at time of discharge if CIR is unable to admit. Patient is disoriented. CSW spoke with patient's daughter and patient's wife regarding PT recommendation of SNF placement at time of discharge. Per patient's wife, patient's wife is currently unable to care for patient at their home given patient's current physical needs and fall risk. Patient's daughter and patient's wife expressed understanding of PT recommendation and are agreeable to SNF placement at time of discharge. CSW to continue to follow and assist with discharge planning needs.   Social Worker assessment / plan:  CSW spoke with patient's daughter and patient's wife concerning possibility of rehab at [facility] before returning home if CIR is unavailable.  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 Patient reported preference for CIR but if not possible, would like [facility] or somewhere in [place].  Patient/Family's Understanding of and Emotional Response to Diagnosis, Current Treatment, and Prognosis:  Patient is realistic regarding therapy needs. No questions/concerns about plan or treatment.    Emotional Assessment Appearance:  Appears stated age Attitude/Demeanor/Rapport:  Unable to Assess Affect (typically observed):  Unable to Assess Orientation:  Oriented to Self Alcohol / Substance use:  Not Applicable Psych involvement (Current and /or in the community):  No (Comment)  Discharge Needs  Concerns to be addressed:  Care Coordination Readmission within the last 30 days:  No Current discharge risk:  None Barriers to Discharge:  Continued Medical Work up   [employer], LCSWA 10/11/2015, 2:44 PM

## 2015-10-11 NOTE — Progress Notes (Signed)
[facility] PROGRESS NOTE  ALIOU MEALEY ZOX:[id number] DOB: May 25, 1945 DOA: 10/01/2015 PCP: Fidel Levy, MD  Assessment/Plan:  1. Acute Hypoxic Respiratory Failure: Status post extubation Resolved.  2. Delirium: Multifactorial. Recommend continuing to avoid any sedating medications. 3. Post-operative Pain: Recommend Tylenol for relief of pain. Toradol IV prn. Could consider use of Ultram which she has tolerated before but would recommend against any narcotic or sedative medications. 4. Aspiration Pneumonia: Currently on Zosyn day #7/7. IS ordered. 5. Hypokalemia: Plan to repeat electrolyte panel in a.m. 6. Hypernatremia: Mild. Recommend continuing to trend electrolytes daily. 7. Hypertension: Restarting Norvasc at lower dose (5mg ) & Lasix daily. 8. Hyperlipidemia: Continuing Zocor. 9. Diabetes Mellitus Type 2: Holding Metformin. Switching accu-checks to qAC & HS w/ SSI per sensitive algorithm. 10. Urinary Retention: Contg Flomax. Continuing to hold on replacing Foley catheter. 11. Anemia: S/P 1u PRBC on 7/20. Improved minimally. Repeat 2u on 7/22. 12. Shock: Resolved. Likely medication induced. 13. Prophylaxis: SCDs. 14. Diet: Clear liquid diet. Advance as tolerated. 15. Disposition: Per Neurosurgery. Patient can likely transition out of the intensive care unit today but will defer to Neurosurgery. PT/OT Consulted.  Consultants:  Neuro  Procedures:  See EMR  Antibiotics:  Zosyn day 7 of 7 (indicate start date, and stop date if known)  HPI/Subjective: Pt states doing well. Tolerating blood transfusion well.  Ready for PT/OT. Denies pain.  Objective: Filed Vitals:   10/11/15 1630 10/11/15 1834  BP: 150/78 139/59  Pulse: 91 82  Temp: 97.9 F (36.6 C) 98.2 F (36.8 C)  Resp: 20 20    Intake/Output Summary (Last 24 hours) at 10/11/15 1939 Last data filed at 10/11/15 1630  Gross per 24 hour  Intake 1672.5 ml  Output   2500 ml  Net -827.5 ml   Filed  Weights   10/08/15 0401 10/09/15 0200 10/10/15 0500  Weight: 104.7 kg (230 lb 13.2 oz) 105.7 kg (233 lb 0.4 oz) 105 kg (231 lb 7.7 oz)    Exam:  General:  No diaphoresis, anxious, no acute distress Cardiovascular: Regular rate and rhythm no murmurs rubs or gallops Respiratory: Clear to auscultation bilaterally no more breathing Abdomen: Nondistended bowel sounds normal nontender palpation  Musculoskeletal: Moving all extremities, no deformity, 5 out of 5 strength   Skin: CDI dressing on R back   Data Reviewed: Basic Metabolic Panel:  Recent Labs Lab 10/07/15 0458 10/07/15 1722 10/07/15 1949 10/08/15 0358 10/09/15 0224 10/09/15 0225 10/10/15 0545 10/11/15 0415 10/11/15 0500  NA 141  --   --  141  --  143 146*  146*  --  143  K 3.1*  --   --  3.2*  --  3.9 3.2*  3.3*  --  3.2*  CL 112*  --   --  116*  --  120* 116*  117*  --  111  CO2 20*  --   --  20*  --  20* 24  23  --  25  GLUCOSE 163*  --   --  152*  --  139* 116*  116*  --  110*  BUN 15  --   --  23*  --  21* 19  19  --  14  CREATININE 0.77  --   --  0.86  --  0.91 0.85  0.84  --  0.80  CALCIUM 8.5*  --   --  8.4*  --  8.7* 8.8*  8.8*  --  8.9  MG 1.6* 1.9  --   --  1.9  --  1.8 1.8  --   PHOS 1.5* 2.6 2.9  --   --  3.4 2.9  --  3.3   Liver Function Tests:  Recent Labs Lab 10/05/15 1610 10/07/15 0458 10/09/15 0225 10/10/15 0545 10/11/15 0500  AST 22  --   --   --   --   ALT 14*  --   --   --   --   ALKPHOS 39  --   --   --   --   BILITOT 0.7  --   --   --   --   PROT 4.8*  --   --   --   --   ALBUMIN 2.4* 2.3* 2.1* 2.1* 2.2*   No results for input(s): LIPASE, AMYLASE in the last 168 hours. No results for input(s): AMMONIA in the last 168 hours. CBC:  Recent Labs Lab 10/05/15 1610 10/06/15 0446 10/08/15 0358 10/09/15 0224 10/09/15 0500 10/10/15 0545 10/11/15 0415  WBC 7.0 10.6* 7.4 8.0  --  7.0 7.0  NEUTROABS 6.0  --  4.7 6.2  --  4.5 4.6  HGB 7.4* 8.9* 7.4* 6.8* 6.5* 7.1* 7.0*  HCT  22.7* 27.1* 22.0* 20.8* 19.0* 21.1* 20.7*  MCV 96.6 92.2 90.9 92.0  --  89.8 89.2  PLT 150 277 253 237  --  269 300   Cardiac Enzymes:  Recent Labs Lab 10/06/15 0021 10/06/15 0446 10/07/15 0953 10/08/15 0934 10/09/15 0953  TROPONINI 0.14* 0.42* 0.23* 0.07* 0.04*   BNP (last 3 results) No results for input(s): BNP in the last 8760 hours.  ProBNP (last 3 results) No results for input(s): PROBNP in the last 8760 hours.  CBG:  Recent Labs Lab 10/10/15 1700 10/10/15 2038 10/11/15 0621 10/11/15 1146 10/11/15 1717  GLUCAP 122* 125* 103* 123* 195*    Recent Results (from the past 240 hour(s))  Culture, blood (Routine X 2) w Reflex to ID Panel     Status: None   Collection Time: 10/05/15  4:10 PM  Result Value Ref Range Status   Specimen Description BLOOD A-LINE  Final   Special Requests BOTTLES DRAWN AEROBIC AND ANAEROBIC  Final   Culture NO GROWTH 5 DAYS  Final   Report Status 10/10/2015 FINAL  Final  Culture, blood (Routine X 2) w Reflex to ID Panel     Status: None   Collection Time: 10/05/15  6:28 PM  Result Value Ref Range Status   Specimen Description BLOOD RIGHT ARM  Final   Special Requests BOTTLES DRAWN AEROBIC AND ANAEROBIC 5CC  Final   Culture NO GROWTH 5 DAYS  Final   Report Status 10/10/2015 FINAL  Final  Culture, respiratory (NON-Expectorated)     Status: None   Collection Time: 10/05/15  7:01 PM  Result Value Ref Range Status   Specimen Description ENDOTRACHEAL  Final   Special Requests NONE  Final   Gram Stain   Final    ABUNDANT WBC PRESENT, PREDOMINANTLY PMN FEW SQUAMOUS EPITHELIAL CELLS PRESENT ABUNDANT GRAM POSITIVE COCCI IN CHAINS IN CLUSTERS FEW GRAM NEGATIVE RODS RARE GRAM NEGATIVE COCCI IN PAIRS    Culture Consistent with normal respiratory flora.  Final   Report Status 10/07/2015 FINAL  Final     Studies: No results found.  Scheduled Meds: . amLODipine  5 mg Oral Daily  . feeding supplement (ENSURE ENLIVE)  237 mL Oral BID BM   . furosemide  20 mg Oral Daily  . insulin aspart  0-5  Units Subcutaneous QHS  . insulin aspart  0-9 Units Subcutaneous TID WC  . piperacillin-tazobactam (ZOSYN)  IV  3.375 g Intravenous Q8H  . potassium chloride  20 mEq Oral Daily  . simvastatin  40 mg Oral Daily  . sodium chloride flush  10-40 mL Intracatheter Q12H  . tamsulosin  0.4 mg Oral Daily   Continuous Infusions:   Active Problems:   Degenerative disc disease, lumbar   Acute respiratory failure (HCC)    Time spent: 30    Haydee Salter  [facility] Pager [phone]. If 7PM-7AM, please contact night-coverage at [email or web address], password [facility] 10/11/2015, 7:39 PM  LOS: 10 days

## 2015-10-11 NOTE — Progress Notes (Signed)
RN spoke with lab technician who stated specimen collected for CMP can be used to obtain renal function test. RN reinforced patient LIJ dressing and placed order x 2 for IV team consult to assess and change patients LIJ dressing. RN will continue to monitor patient.

## 2015-10-11 NOTE — Progress Notes (Signed)
Filed Vitals:   10/10/15 1703 10/10/15 2146 10/11/15 0131 10/11/15 0544  BP: 138/64 143/62 154/68 156/68  Pulse: 74 81 78 85  Temp: 98.9 F (37.2 C) 98.2 F (36.8 C) 98.2 F (36.8 C) 98.4 F (36.9 C)  TempSrc: Axillary Oral Oral Oral  Resp: 20 20 20 20   Height:      Weight:      SpO2: 95% 100% 95% 94%    CBC  Recent Labs  10/10/15 0545 10/11/15 0415  WBC 7.0 7.0  HGB 7.1* 7.0*  HCT 21.1* 20.7*  PLT 269 300   BMET  Recent Labs  10/10/15 0545 10/11/15 0500  NA 146*  146* 143  K 3.2*  3.3* 3.2*  CL 116*  117* 111  CO2 24  23 25   GLUCOSE 116*  116* 110*  BUN 19  19 14   CREATININE 0.85  0.84 0.80  CALCIUM 8.8*  8.8* 8.9    Patient resting in bed. Patient's wife and daughter at bedside. They report that he had a restless night with confusion. Wound examined. Clean and dry; no erythema swelling or drainage. Seen by Dr. Riley Kill who recommends CIR. Significant anemia which will certainly limit progression.  Plan: Spoke with patient's wife and daughter at length at the bedside about his condition and plans for treatment and care. Have ordered transfusion of 2 units of packed red blood cells. Continuing PT and OT.  Hewitt Shorts, MD 10/11/2015, 8:35 AM

## 2015-10-11 NOTE — [place] FL2 (Signed)
[place] MEDICAID FL2 LEVEL OF CARE SCREENING TOOL     IDENTIFICATION  [name] Birthdate: May 15, 1945 Sex: male Admission Date (Current Location): 10/01/2015  [facility] and [place] Number:  Producer, television/film/video and Address:  [facility] [place], [address], [place], [place] [address]      Provider Number: [id number]  Attending Physician Name and Address:  Haydee Salter, MD  Relative Name and Phone Number:  Elease Hashimoto, spouse, [phone]    Current Level of Care: Hospital Recommended Level of Care: Skilled Nursing Facility Prior Approval Number:    Date Approved/Denied:   PASRR Number: [id number]  Discharge Plan: SNF    Current Diagnoses: Patient Active Problem List   Diagnosis Date Noted  . Acute respiratory failure (HCC)   . Degenerative disc disease, lumbar 10/01/2015  . Sciatica of right side associated with disorder of lumbosacral spine 07/21/2015  . Hypertension 02/18/2015  . Diabetes (HCC) 02/18/2015  . Gout 02/18/2015  . Hyperlipidemia 02/18/2015  . Arthritis 02/18/2015  . Encounter to establish care 02/18/2015    Orientation RESPIRATION BLADDER Height & Weight     Self, Time  Normal Incontinent, External catheter (Condom catheter) Weight: 105 kg (231 lb 7.7 oz) Height:  5\' 10"  (177.8 cm)  BEHAVIORAL SYMPTOMS/MOOD NEUROLOGICAL BOWEL NUTRITION STATUS      Incontinent Diet (Please see DC Summary)  AMBULATORY STATUS COMMUNICATION OF NEEDS Skin   Extensive Assist Verbally Surgical wounds (Closed incision on back)                       Personal Care Assistance Level of Assistance  Bathing, Feeding, Dressing Bathing Assistance: Maximum assistance Feeding assistance: Limited assistance Dressing Assistance: Limited assistance     Functional Limitations Info             SPECIAL CARE FACTORS FREQUENCY  PT (By licensed PT)     PT Frequency: 5x/week              Contractures      Additional Factors  Info  Code Status, Allergies, Insulin Sliding Scale Code Status Info: Full Allergies Info: Ace Inhibitors   Insulin Sliding Scale Info: insulin aspart (novoLOG) injection 0-5 Units; insulin aspart (novoLOG) injection 0-9 Units       Current Medications (10/11/2015):  This is the current hospital active medication list Current Facility-Administered Medications  Medication Dose Route Frequency Provider Last Rate Last Dose  . acetaminophen (TYLENOL) tablet 650 mg  650 mg Oral Q4H PRN Hilda Lias, MD   650 mg at 10/11/15 9798   Or  . acetaminophen (TYLENOL) suppository 650 mg  650 mg Rectal Q4H PRN Hilda Lias, MD   650 mg at 10/09/15 0410  . amLODipine (NORVASC) tablet 5 mg  5 mg Oral Daily Roslynn Amble, MD   5 mg at 10/11/15 1027  . feeding supplement (ENSURE ENLIVE) (ENSURE ENLIVE) liquid 237 mL  237 mL Oral BID BM Hilda Lias, MD   237 mL at 10/11/15 1027  . furosemide (LASIX) tablet 20 mg  20 mg Oral Daily Simonne Martinet, NP   20 mg at 10/11/15 1027  . insulin aspart (novoLOG) injection 0-5 Units  0-5 Units Subcutaneous QHS Roslynn Amble, MD   0 Units at 10/10/15 2200  . insulin aspart (novoLOG) injection 0-9 Units  0-9 Units Subcutaneous TID WC Roslynn Amble, MD   0 Units at 10/10/15 1727  . ketorolac (TORADOL) 15 MG/ML injection  15 mg  15 mg Intravenous Q6H PRN Karl Ito, MD   15 mg at 10/11/15 0234  . piperacillin-tazobactam (ZOSYN) IVPB 3.375 g  3.375 g Intravenous Q8H Rachel L Rumbarger, RPH   3.375 g at 10/11/15 1026  . potassium chloride 20 MEQ/15ML (10%) solution 20 mEq  20 mEq Oral Daily Simonne Martinet, NP   20 mEq at 10/11/15 1027  . simvastatin (ZOCOR) tablet 40 mg  40 mg Oral Daily Roslynn Amble, MD   40 mg at 10/11/15 1027  . sodium chloride flush (NS) 0.9 % injection 10-40 mL  10-40 mL Intracatheter Q12H Hilda Lias, MD   20 mL at 10/11/15 1028  . sodium chloride flush (NS) 0.9 % injection 10-40 mL  10-40 mL Intracatheter PRN Hilda Lias,  MD      . tamsulosin [facility]) capsule 0.4 mg  0.4 mg Oral Daily Simonne Martinet, NP   0.4 mg at 10/11/15 1027     Discharge Medications: Please see discharge summary for a list of discharge medications.  Relevant Imaging Results:  Relevant Lab Results:   Additional Information SSN: [id number] [place], [place]

## 2015-10-12 DIAGNOSIS — E785 Hyperlipidemia, unspecified: Secondary | ICD-10-CM | POA: Diagnosis not present

## 2015-10-12 DIAGNOSIS — D649 Anemia, unspecified: Secondary | ICD-10-CM | POA: Clinically undetermined

## 2015-10-12 DIAGNOSIS — G9340 Encephalopathy, unspecified: Secondary | ICD-10-CM | POA: Clinically undetermined

## 2015-10-12 DIAGNOSIS — R339 Retention of urine, unspecified: Secondary | ICD-10-CM | POA: Diagnosis not present

## 2015-10-12 DIAGNOSIS — R410 Disorientation, unspecified: Secondary | ICD-10-CM | POA: Diagnosis not present

## 2015-10-12 DIAGNOSIS — Z794 Long term (current) use of insulin: Secondary | ICD-10-CM | POA: Diagnosis not present

## 2015-10-12 DIAGNOSIS — E083513 Diabetes mellitus due to underlying condition with proliferative diabetic retinopathy with macular edema, bilateral: Secondary | ICD-10-CM | POA: Diagnosis not present

## 2015-10-12 DIAGNOSIS — E1310 Other specified diabetes mellitus with ketoacidosis without coma: Secondary | ICD-10-CM | POA: Diagnosis not present

## 2015-10-12 DIAGNOSIS — Z981 Arthrodesis status: Secondary | ICD-10-CM

## 2015-10-12 DIAGNOSIS — E876 Hypokalemia: Secondary | ICD-10-CM | POA: Clinically undetermined

## 2015-10-12 DIAGNOSIS — J9601 Acute respiratory failure with hypoxia: Secondary | ICD-10-CM | POA: Diagnosis not present

## 2015-10-12 LAB — TYPE AND SCREEN
ABO/RH(D): O POS
ANTIBODY SCREEN: NEGATIVE
UNIT DIVISION: 0
UNIT DIVISION: 0
Unit division: 0

## 2015-10-12 LAB — GLUCOSE, CAPILLARY
GLUCOSE-CAPILLARY: 114 mg/dL — AB (ref 65–99)
GLUCOSE-CAPILLARY: 174 mg/dL — AB (ref 65–99)
Glucose-Capillary: 107 mg/dL — ABNORMAL HIGH (ref 65–99)
Glucose-Capillary: 188 mg/dL — ABNORMAL HIGH (ref 65–99)

## 2015-10-12 LAB — RENAL FUNCTION PANEL
ANION GAP: 8 (ref 5–15)
Albumin: 2.4 g/dL — ABNORMAL LOW (ref 3.5–5.0)
BUN: 13 mg/dL (ref 6–20)
CO2: 25 mmol/L (ref 22–32)
Calcium: 8.9 mg/dL (ref 8.9–10.3)
Chloride: 108 mmol/L (ref 101–111)
Creatinine, Ser: 0.79 mg/dL (ref 0.61–1.24)
Glucose, Bld: 103 mg/dL — ABNORMAL HIGH (ref 65–99)
POTASSIUM: 3.2 mmol/L — AB (ref 3.5–5.1)
Phosphorus: 2.9 mg/dL (ref 2.5–4.6)
Sodium: 141 mmol/L (ref 135–145)

## 2015-10-12 LAB — CBC WITH DIFFERENTIAL/PLATELET
BASOS ABS: 0.1 10*3/uL (ref 0.0–0.1)
Basophils Relative: 1 %
EOS PCT: 4 %
Eosinophils Absolute: 0.3 10*3/uL (ref 0.0–0.7)
HCT: 28.2 % — ABNORMAL LOW (ref 39.0–52.0)
Hemoglobin: 9.4 g/dL — ABNORMAL LOW (ref 13.0–17.0)
LYMPHS PCT: 19 %
Lymphs Abs: 1.6 10*3/uL (ref 0.7–4.0)
MCH: 29.6 pg (ref 26.0–34.0)
MCHC: 33.3 g/dL (ref 30.0–36.0)
MCV: 88.7 fL (ref 78.0–100.0)
MONO ABS: 0.6 10*3/uL (ref 0.1–1.0)
Monocytes Relative: 7 %
Neutro Abs: 5.8 10*3/uL (ref 1.7–7.7)
Neutrophils Relative %: 70 %
PLATELETS: 354 10*3/uL (ref 150–400)
RBC: 3.18 MIL/uL — ABNORMAL LOW (ref 4.22–5.81)
RDW: 14.2 % (ref 11.5–15.5)
WBC: 8.3 10*3/uL (ref 4.0–10.5)

## 2015-10-12 LAB — MAGNESIUM: MAGNESIUM: 1.6 mg/dL — AB (ref 1.7–2.4)

## 2015-10-12 MED ORDER — MAGNESIUM SULFATE 2 GM/50ML IV SOLN
2.0000 g | Freq: Once | INTRAVENOUS | Status: AC
  Administered 2015-10-12: 2 g via INTRAVENOUS
  Filled 2015-10-12: qty 50

## 2015-10-12 MED ORDER — POTASSIUM CHLORIDE CRYS ER 20 MEQ PO TBCR
40.0000 meq | EXTENDED_RELEASE_TABLET | Freq: Once | ORAL | Status: AC
  Administered 2015-10-12: 40 meq via ORAL
  Filled 2015-10-12: qty 2

## 2015-10-12 NOTE — Progress Notes (Signed)
[facility] PROGRESS NOTE  Danny Patterson VXB:[id number] DOB: October 03, 1945 DOA: 10/01/2015 PCP: Fidel Levy, MD  Assessment/Plan:  1. Acute Hypoxic Respiratory Failure: Status post extubation Resolved.  2. Delirium: Multifactorial. Recommend continuing to avoid any sedating medications. 3. Post-operative Pain: Recommend Tylenol for relief of pain. Toradol IV prn. Could consider use of Ultram which she has tolerated before but would recommend against any narcotic or sedative medications. 4. Aspiration Pneumonia: Zosyn 7 day  Completed 7/22. IS ordered. 5. Hypokalemia: Plan to repeat electrolyte panel in a.m. 6. Hypernatremia , resolved:  Will continuing to trend electrolytes daily. 7. Hypertension: Restarting Norvasc at lower dose (5mg ) & Lasix daily. 8. Hyperlipidemia: Continuing Zocor. 9. Diabetes Mellitus Type 2: Holding Metformin. Switching accu-checks to qAC & HS w/ SSI per sensitive algorithm. 10. Urinary Retention: Cont Flomax. Continuing to hold on replacing Foley catheter, starting urinal.. 11. Anemia: S/P 1u PRBC on 7/20. Improved minimally. Repeat 2u on 7/22. Good response. Neg occult stool. 12. Shock: Resolved. Likely medication induced. 13. Prophylaxis: SCDs. 14. Diet: Clear liquid diet. Advance as tolerated. 15. Slurred speech?: dgtr feels speech slurred getting MRI 16. Disposition: CIR likely vs SNF  Consultants:  Neuro  Procedures:  See EMR  Antibiotics:  Zosyn day 7 of 7 (indicate start date, and stop date if known)  HPI/Subjective: Pt states doing well. Dgtr concerned speech sounds slurred to her. Denies pain.  Objective: Vitals:   10/12/15 0500 10/12/15 1016  BP: (!) 152/72 (!) 143/75  Pulse: 96 88  Resp: 19 17  Temp: 98 F (36.7 C) 98 F (36.7 C)    Intake/Output Summary (Last 24 hours) at 10/12/15 1817 Last data filed at 10/12/15 0500  Gross per 24 hour  Intake                0 ml  Output             1100 ml  Net            -1100  ml   Filed Weights   10/08/15 0401 10/09/15 0200 10/10/15 0500  Weight: 104.7 kg (230 lb 13.2 oz) 105.7 kg (233 lb 0.4 oz) 105 kg (231 lb 7.7 oz)    Exam:  General:  No diaphoresis, anxious, no acute distress Cardiovascular: Regular rate and rhythm no murmurs rubs or gallops Respiratory: Clear to auscultation bilaterally no more breathing Abdomen: Nondistended bowel sounds normal nontender palpation  Musculoskeletal: Moving all extremities, no deformity, 5 out of 5 strength   Skin: CDI dressing on R back   Data Reviewed: Basic Metabolic Panel:  Recent Labs Lab 10/07/15 1722 10/07/15 1949 10/08/15 0358 10/09/15 0224 10/09/15 0225 10/10/15 0545 10/11/15 0415 10/11/15 0500 10/12/15 0425  NA  --   --  141  --  143 146*  146*  --  143 141  K  --   --  3.2*  --  3.9 3.2*  3.3*  --  3.2* 3.2*  CL  --   --  116*  --  120* 116*  117*  --  111 108  CO2  --   --  20*  --  20* 24  23  --  25 25  GLUCOSE  --   --  152*  --  139* 116*  116*  --  110* 103*  BUN  --   --  23*  --  21* 19  19  --  14 13  CREATININE  --   --  0.86  --  0.91 0.85  0.84  --  0.80 0.79  CALCIUM  --   --  8.4*  --  8.7* 8.8*  8.8*  --  8.9 8.9  MG 1.9  --   --  1.9  --  1.8 1.8  --  1.6*  PHOS 2.6 2.9  --   --  3.4 2.9  --  3.3 2.9   Liver Function Tests:  Recent Labs Lab 10/07/15 0458 10/09/15 0225 10/10/15 0545 10/11/15 0500 10/12/15 0425  ALBUMIN 2.3* 2.1* 2.1* 2.2* 2.4*   No results for input(s): LIPASE, AMYLASE in the last 168 hours. No results for input(s): AMMONIA in the last 168 hours. CBC:  Recent Labs Lab 10/08/15 0358 10/09/15 0224 10/09/15 0500 10/10/15 0545 10/11/15 0415 10/12/15 0425  WBC 7.4 8.0  --  7.0 7.0 8.3  NEUTROABS 4.7 6.2  --  4.5 4.6 5.8  HGB 7.4* 6.8* 6.5* 7.1* 7.0* 9.4*  HCT 22.0* 20.8* 19.0* 21.1* 20.7* 28.2*  MCV 90.9 92.0  --  89.8 89.2 88.7  PLT 253 237  --  269 300 354   Cardiac Enzymes:  Recent Labs Lab 10/06/15 0021 10/06/15 0446  10/07/15 0953 10/08/15 0934 10/09/15 0953  TROPONINI 0.14* 0.42* 0.23* 0.07* 0.04*   BNP (last 3 results) No results for input(s): BNP in the last 8760 hours.  ProBNP (last 3 results) No results for input(s): PROBNP in the last 8760 hours.  CBG:  Recent Labs Lab 10/11/15 1717 10/11/15 2310 10/12/15 0629 10/12/15 1141 10/12/15 1646  GLUCAP 195* 126* 114* 107* 188*    Recent Results (from the past 240 hour(s))  Culture, blood (Routine X 2) w Reflex to ID Panel     Status: None   Collection Time: 10/05/15  4:10 PM  Result Value Ref Range Status   Specimen Description BLOOD A-LINE  Final   Special Requests BOTTLES DRAWN AEROBIC AND ANAEROBIC  Final   Culture NO GROWTH 5 DAYS  Final   Report Status 10/10/2015 FINAL  Final  Culture, blood (Routine X 2) w Reflex to ID Panel     Status: None   Collection Time: 10/05/15  6:28 PM  Result Value Ref Range Status   Specimen Description BLOOD RIGHT ARM  Final   Special Requests BOTTLES DRAWN AEROBIC AND ANAEROBIC 5CC  Final   Culture NO GROWTH 5 DAYS  Final   Report Status 10/10/2015 FINAL  Final  Culture, respiratory (NON-Expectorated)     Status: None   Collection Time: 10/05/15  7:01 PM  Result Value Ref Range Status   Specimen Description ENDOTRACHEAL  Final   Special Requests NONE  Final   Gram Stain   Final    ABUNDANT WBC PRESENT, PREDOMINANTLY PMN FEW SQUAMOUS EPITHELIAL CELLS PRESENT ABUNDANT GRAM POSITIVE COCCI IN CHAINS IN CLUSTERS FEW GRAM NEGATIVE RODS RARE GRAM NEGATIVE COCCI IN PAIRS    Culture Consistent with normal respiratory flora.  Final   Report Status 10/07/2015 FINAL  Final     Studies: Dg Chest Port 1 View  Result Date: 10/11/2015 CLINICAL DATA:  Cough. EXAM: PORTABLE CHEST 1 VIEW COMPARISON:  10/08/2015 FINDINGS: Interval removal of endotracheal tube and enteric tube. Left IJ central venous catheter remains in place with tip over the SVC. Lungs are adequately inflated with mild hazy left  retrocardiac density unchanged. Left costophrenic angle is cut off the film. Cardiomediastinal silhouette and remainder of the exam is unchanged. IMPRESSION: Mild hazy left retrocardiac density unchanged and may be due to effusion/atelectasis  versus infection. Electronically Signed   By: Elberta Fortis M.D.   On: 10/11/2015 20:17    Scheduled Meds: . amLODipine  5 mg Oral Daily  . feeding supplement (ENSURE ENLIVE)  237 mL Oral BID BM  . furosemide  20 mg Oral Daily  . insulin aspart  0-5 Units Subcutaneous QHS  . insulin aspart  0-9 Units Subcutaneous TID WC  . piperacillin-tazobactam (ZOSYN)  IV  3.375 g Intravenous Q8H  . potassium chloride  20 mEq Oral Daily  . simvastatin  40 mg Oral Daily  . sodium chloride flush  10-40 mL Intracatheter Q12H  . tamsulosin  0.4 mg Oral Daily   Continuous Infusions:   Principal Problem:   Acute respiratory failure (HCC) Active Problems:   Hypertension   Diabetes (HCC)   Gout   Hyperlipidemia   Degenerative disc disease, lumbar   Acute delirium   Hypokalemia   Anemia   S/P lumbar fusion    Time spent: 30    Haydee Salter  [facility] Pager [phone]. If 7PM-7AM, please contact night-coverage at [email or web address], password [facility] 10/12/2015, 6:17 PM  LOS: 11 days

## 2015-10-12 NOTE — Clinical Social Work Note (Signed)
CSW met with daughter Morey Hummingbird and spoke with daughter Janace Hoard via phone. SNF bed offers provided; family is very appreciative of information but really want CIR as first choice. Discussed bed placement process of stability, bed availability and insurance approval for both CIR and SNF and they verbalized understanding. They will relay information to patient's wife and will ask weekday SW to follow up re: stability. Hopefully will be able to get approval and bed to be available in CIR.  SNF choice per family is [facility] who has not yet responded with offer.  Family strongly encouraged to have back up to [facility] in case unable to go to CIR and no bed at [facility]. Lorie Phenix. Pauline Good, [place]

## 2015-10-12 NOTE — Progress Notes (Signed)
Delirious Crit better Moving legs well Stable

## 2015-10-13 ENCOUNTER — Inpatient Hospital Stay (HOSPITAL_COMMUNITY): Payer: PPO

## 2015-10-13 DIAGNOSIS — D649 Anemia, unspecified: Secondary | ICD-10-CM | POA: Diagnosis not present

## 2015-10-13 DIAGNOSIS — Z794 Long term (current) use of insulin: Secondary | ICD-10-CM | POA: Diagnosis not present

## 2015-10-13 DIAGNOSIS — E1310 Other specified diabetes mellitus with ketoacidosis without coma: Secondary | ICD-10-CM | POA: Diagnosis not present

## 2015-10-13 DIAGNOSIS — J9811 Atelectasis: Secondary | ICD-10-CM | POA: Diagnosis not present

## 2015-10-13 DIAGNOSIS — G9389 Other specified disorders of brain: Secondary | ICD-10-CM | POA: Diagnosis not present

## 2015-10-13 DIAGNOSIS — E083513 Diabetes mellitus due to underlying condition with proliferative diabetic retinopathy with macular edema, bilateral: Secondary | ICD-10-CM | POA: Diagnosis not present

## 2015-10-13 DIAGNOSIS — E785 Hyperlipidemia, unspecified: Secondary | ICD-10-CM | POA: Diagnosis not present

## 2015-10-13 DIAGNOSIS — R410 Disorientation, unspecified: Secondary | ICD-10-CM | POA: Diagnosis not present

## 2015-10-13 DIAGNOSIS — J9601 Acute respiratory failure with hypoxia: Secondary | ICD-10-CM | POA: Diagnosis not present

## 2015-10-13 DIAGNOSIS — R339 Retention of urine, unspecified: Secondary | ICD-10-CM | POA: Diagnosis not present

## 2015-10-13 LAB — RENAL FUNCTION PANEL
ALBUMIN: 2.7 g/dL — AB (ref 3.5–5.0)
ANION GAP: 11 (ref 5–15)
BUN: 10 mg/dL (ref 6–20)
CALCIUM: 9.2 mg/dL (ref 8.9–10.3)
CO2: 26 mmol/L (ref 22–32)
CREATININE: 0.87 mg/dL (ref 0.61–1.24)
Chloride: 106 mmol/L (ref 101–111)
GFR calc non Af Amer: >60 mL/min (ref >60)
GLUCOSE: 124 mg/dL — AB (ref 65–99)
PHOSPHORUS: 2.8 mg/dL (ref 2.5–4.6)
Potassium: 3.3 mmol/L — ABNORMAL LOW (ref 3.5–5.1)
SODIUM: 143 mmol/L (ref 135–145)

## 2015-10-13 LAB — GLUCOSE, CAPILLARY
Glucose-Capillary: 141 mg/dL — ABNORMAL HIGH (ref 65–99)
Glucose-Capillary: 119 mg/dL — ABNORMAL HIGH (ref 65–99)
Glucose-Capillary: 164 mg/dL — ABNORMAL HIGH (ref 65–99)
Glucose-Capillary: 145 mg/dL — ABNORMAL HIGH (ref 65–99)

## 2015-10-13 LAB — MAGNESIUM: Magnesium: 1.9 mg/dL (ref 1.7–2.4)

## 2015-10-13 LAB — URINALYSIS, ROUTINE W REFLEX MICROSCOPIC
Bilirubin Urine: NEGATIVE
GLUCOSE, UA: NEGATIVE mg/dL
Hgb urine dipstick: NEGATIVE
KETONES UR: NEGATIVE mg/dL
LEUKOCYTES UA: NEGATIVE
NITRITE: NEGATIVE
PH: 6.5 (ref 5.0–8.0)
Protein, ur: NEGATIVE mg/dL
SPECIFIC GRAVITY, URINE: 1.011 (ref 1.005–1.030)

## 2015-10-13 LAB — CBC WITH DIFFERENTIAL/PLATELET
BASOS PCT: 0 %
Basophils Absolute: 0 10*3/uL (ref 0.0–0.1)
EOS ABS: 0.3 10*3/uL (ref 0.0–0.7)
EOS PCT: 3 %
HCT: 31.4 % — ABNORMAL LOW (ref 39.0–52.0)
HEMOGLOBIN: 10.4 g/dL — AB (ref 13.0–17.0)
Lymphocytes Relative: 23 %
Lymphs Abs: 2.1 10*3/uL (ref 0.7–4.0)
MCH: 29.5 pg (ref 26.0–34.0)
MCHC: 33.1 g/dL (ref 30.0–36.0)
MCV: 89 fL (ref 78.0–100.0)
Monocytes Absolute: 0.6 10*3/uL (ref 0.1–1.0)
Monocytes Relative: 6 %
NEUTROS PCT: 68 %
Neutro Abs: 6.3 10*3/uL (ref 1.7–7.7)
PLATELETS: 470 10*3/uL — AB (ref 150–400)
RBC: 3.53 MIL/uL — AB (ref 4.22–5.81)
RDW: 14 % (ref 11.5–15.5)
WBC: 9.2 10*3/uL (ref 4.0–10.5)

## 2015-10-13 MED ORDER — KCL IN DEXTROSE-NACL 20-5-0.45 MEQ/L-%-% IV SOLN
INTRAVENOUS | Status: AC
  Administered 2015-10-13: 11:00:00 via INTRAVENOUS
  Filled 2015-10-13: qty 1000

## 2015-10-13 MED ORDER — TRAZODONE HCL 50 MG PO TABS: 50.0000 mg | ORAL_TABLET | Freq: Every evening | ORAL | Status: DC | PRN

## 2015-10-13 MED ORDER — LORAZEPAM 2 MG/ML IJ SOLN
0.5000 mg | Freq: Once | INTRAMUSCULAR | Status: AC
  Administered 2015-10-13: 0.5 mg via INTRAVENOUS
  Filled 2015-10-13: qty 1

## 2015-10-13 NOTE — Progress Notes (Signed)
Patient has had watery, loose stool x 4 in last 48 hours. Patient currently on zosyn q 8 hours. Per MD order zosyn IV therapy complete on 10/11/15. Zosyn initiated on 10/07/15, patient received 18 doses. RN did not administered schedule 0200 zosyn dose on 10/12/15. On call MD paged. Patient ambulated from bed to [facility], patient was able to stand with 2 assist. Patient tolerated fair-poor. To ensure patient safety stedy lift was used to get patient back to bed. Patient was very discouraged. RN provided reassurance and encouragement. Patients hospital stay and treatment plan reviewed and discussed with patient. Patient v/u and lying comfortably in bed. RN will continue to monitor patient.

## 2015-10-13 NOTE — Care Management Important Message (Signed)
Important Message  Patient Details  Name: Danny Patterson MRN: [id number] Date of Birth: Oct 28, 1945   Medicare Important Message Given:  Yes    Bernadette Hoit 10/13/2015, 9:03 AM

## 2015-10-13 NOTE — Progress Notes (Signed)
Patient noted agitated, attempting to get out of bed. Pt appears confused x4, unable to oriented or calm pt down. Family notified. MD aware of situation, sitter requested with order. Off coming RN administered ativan at this time. Will continue to monitor.   Yamilett Anastos, RN.

## 2015-10-13 NOTE — Progress Notes (Signed)
Physical Therapy Treatment Patient Details Name: Danny Patterson MRN: [id number] DOB: November 21, 1945 Today's Date: 10/13/2015    History of Present Illness Patient is a 70 yo male s/p Lumbar three-four, Lumbar four-five, Lumbar five-Sacrum one posterior lumbar interbody fusion with Laminotomy at Lumbar Two-Three complicated by post op dural tear/csf leak and then required intubation for respiratory insufficiency, hypoxia, shock, extubated 7/20.    PT Comments    Pt more alert this date but remains confused. Unaware he is in a hospital or that he had back surgery, but is pleasant. Pt tolerated ambulation this date. Con't to recommend CIR Upon d/c for maximal functional recovery.  Follow Up Recommendations  CIR;Supervision/Assistance - 24 hour     Equipment Recommendations  Rolling walker with 5" wheels;3in1 (PT)    Recommendations for Other Services Rehab consult     Precautions / Restrictions Precautions Precautions: Back Precaution Booklet Issued: No Precaution Comments: verbally reviewed with patient and family Required Braces or Orthoses: Spinal Brace Spinal Brace: Lumbar corset Restrictions Weight Bearing Restrictions: No    Mobility  Bed Mobility Overal bed mobility: Needs Assistance;+2 for physical assistance Bed Mobility: Rolling;Sidelying to Sit Rolling: Mod assist;Max assist;+2 for physical assistance Sidelying to sit: Mod assist;Max assist;+2 for physical assistance       General bed mobility comments: pt initiated rolling but required assist to achieve full sidelying and trunk elevation  Transfers Overall transfer level: Needs assistance Equipment used: Rolling walker (2 wheeled) Transfers: Sit to/from Stand Sit to Stand: Min assist;+2 physical assistance;From elevated surface         General transfer comment: max directional v/c's to minimize trunk flexion and push up with UEs. increased time  Ambulation/Gait Ambulation/Gait assistance: Mod assist;+2  physical assistance;+2 safety/equipment Ambulation Distance (Feet): 50 Feet Assistive device: Rolling walker (2 wheeled) Gait Pattern/deviations: Decreased stride length;Step-to pattern;Shuffle (decreased R step height,drop foot) Gait velocity: slow Gait velocity interpretation: Below normal speed for age/gender General Gait Details: max verbal cues to pick up feet, modA for walker management, pt with vearing to the Right, 3rd person for chair follow and IV pole   Stairs            Wheelchair Mobility    Modified Rankin (Stroke Patients Only)       Balance Overall balance assessment: Needs assistance Sitting-balance support: Feet supported;Bilateral upper extremity supported Sitting balance-Leahy Scale: Poor       Standing balance-Leahy Scale: Poor Standing balance comment: pt requires RW for amb                    Cognition Arousal/Alertness: Awake/alert Behavior During Therapy: WFL for tasks assessed/performed Overall Cognitive Status: Impaired/Different from baseline     Current Attention Level: Sustained Memory: Decreased recall of precautions;Decreased short-term memory   Safety/Judgement: Decreased awareness of safety;Decreased awareness of deficits Awareness: Emergent Problem Solving: Slow processing;Difficulty sequencing;Requires verbal cues;Requires tactile cues      Exercises      General Comments        Pertinent Vitals/Pain Pain Assessment: 0-10 Pain Score: 1  Pain Location: Back pain Pain Descriptors / Indicators: Discomfort Pain Intervention(s): Monitored during session    Home Living                      Prior Function            PT Goals (current goals can now be found in the care plan section) Acute Rehab PT Goals Patient Stated Goal: go to rehab  Progress towards PT goals: Progressing toward goals    Frequency  Min 5X/week    PT Plan Current plan remains appropriate    Co-evaluation             End  of Session Equipment Utilized During Treatment: Gait belt;Back brace Activity Tolerance: Patient limited by fatigue Patient left: in chair;with call bell/phone within reach;with chair alarm set;with nursing/sitter in room     Time: 1326-1355 PT Time Calculation (min) (ACUTE ONLY): 29 min  Charges:  $Gait Training: 8-22 mins $Therapeutic Activity: 8-22 mins                    G Codes:      Marcene Brawn 10/13/2015, 2:47 PM   Lewis Shock, PT, DPT Pager #: [phone] Office #: [phone]

## 2015-10-13 NOTE — Progress Notes (Signed)
OOB chair per bariatric steady, tolerated chair x and then to bathroom via stedy for mod. Soft BM

## 2015-10-13 NOTE — Progress Notes (Signed)
Patient ID: Danny Patterson, male   DOB: 09/23/1945, 70 y.o.   MRN: [id number] Long talk with family. Looked at the brain mri together. Neurologist to see. Continue pt/ot

## 2015-10-13 NOTE — Progress Notes (Addendum)
Occupational Therapy Treatment Patient Details Name: Danny Patterson MRN: [id number] DOB: January 31, 1946 Today's Date: 10/13/2015    History of present illness Patient is a 70 yo male s/p Lumbar three-four, Lumbar four-five, Lumbar five-Sacrum one posterior lumbar interbody fusion with Laminotomy at Lumbar Two-Three complicated by post op dural tear/csf leak and then required intubation for respiratory insufficiency, hypoxia, shock, extubated 7/20.   OT comments  Unable to treat pt this session due to pt sleeping heavily form Ativan given earlier due to confusion, hallucinations and agitation/behaviors with nursing staff. Pt's wife and daughter present and had questions for OT about therapy on acute floor vs CIR setting. Rehab admissions coordinator Darl Pikes leaving room upon OT entering room. Educated pt's family on OT vs PT, A/E needs for pt with back precautions, benefits of pt having family present during therapy to decrease agitation/confusion. Pt's wife and daughter very involved in pt's care and very supportive and had many good questions related to therapy. OT will continue to follow acutely  Follow Up Recommendations  CIR;Supervision/Assistance - 24 hour    Equipment Recommendations   (TBD)    Recommendations for Other Services      Precautions / Restrictions Precautions Precautions: Back Required Braces or Orthoses: Spinal Brace Spinal Brace: Lumbar corset Restrictions Weight Bearing Restrictions: No       Mobility Bed Mobility               General bed mobility comments: did not perform  Transfers                 General transfer comment: did not perform    Balance                                   ADL                                         General ADL Comments: did not perform                                      Cognition  pt sleeping heavily due to Ativan                                                      General Comments  Pt's wife and daughter very supportive    Pertinent Vitals/ Pain       Pain Assessment: No/denies pain  Home Living    home with wife                                                    Frequency Min 3X/week     Progress Toward Goals  OT Goals(current goals can now be found in the care plan section)  Progress towards OT goals: OT to reassess next treatment  Acute Rehab OT Goals Patient Stated Goal: go to rehab per pt's family  Plan Discharge plan remains appropriate  End of Session     Activity Tolerance Patient limited by lethargy;Other (comment) (pt sleeping heavily)   Patient Left in bed;with call bell/phone within reach;with family/visitor present   Nurse Communication          Time: 1610-9604 OT Time Calculation (min): 10 min  Charges: OT General Charges $OT Visit: 1 Procedure OT Treatments $Therapeutic Activity: 8-22 mins  Galen Manila 10/13/2015, 1:22 PM

## 2015-10-13 NOTE — Evaluation (Signed)
Speech Language Pathology Evaluation Patient Details Name: Danny Patterson MRN: [id number] DOB: December 01, 1945 Today's Date: 10/13/2015 Time: 7829-5621 SLP Time Calculation (min) (ACUTE ONLY): 35 min  Problem List:  Patient Active Problem List   Diagnosis Date Noted  . Acute delirium 10/12/2015  . Hypokalemia 10/12/2015  . Anemia 10/12/2015  . S/P lumbar fusion 10/12/2015  . Acute respiratory failure (HCC)   . Degenerative disc disease, lumbar 10/01/2015  . Sciatica of right side associated with disorder of lumbosacral spine 07/21/2015  . Hypertension 02/18/2015  . Diabetes (HCC) 02/18/2015  . Gout 02/18/2015  . Hyperlipidemia 02/18/2015  . Arthritis 02/18/2015  . Encounter to establish care 02/18/2015   Past Medical History:  Past Medical History:  Diagnosis Date  . Allergy   . Arthritis   . Diabetes mellitus without complication (HCC)   . Gout   . Hyperlipidemia   . Hypertension   . Kidney stone   . Rheumatic fever    Past Surgical History:  Past Surgical History:  Procedure Laterality Date  . BACK SURGERY    . knot     removed from neck  . POSTERIOR LUMBAR FUSION 4 LEVEL N/A 10/01/2015   Procedure: Lumbar three-four,  Lumbar four-five,  Lumbar five-Sacrum one posterior lumbar interbody fusion with Laminotomy at Lumbar Two-Three;  Surgeon: Hilda Lias, MD;  Location: [facility];  Service: Neurosurgery;  Laterality: N/A;   HPI:  70 year old admitted 10/01/15 due to foot pain. Pt underwent posterior lumbar interbody fusion with Laminotomy, complicated by post op dural tear/csf leak and then required intubation for respiratory insufficiency, hypoxia, shock. Extubated 7/20. 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   Assessment / Plan / Recommendation Clinical Impression  The Montreal Cognitive Assessment (MoCA) was administered. Pt scored 13/30 (n=26+/30), indicating moderate cognitive impairment. Difficulty noted on the following subtests: executive  function, immediate and delayed recall, thought organization, calculation, abstract reasoning, visuoperception, naming and attention. Pt reports he was working approximately 30 hours a week prior to admit. Pt's daughter present, and reports pt seems to be progressively more confused over time, with recall of long term memories better than short term recall. She also reported pt having symptoms of PTSD from his service in the war.     SLP Assessment  All further Speech Language Pathology needs can be addressed in the next venue of care    Follow Up Recommendations  24 hour supervision/assistance    Frequency and Duration   per SLP at next level of care      SLP Evaluation Prior Functioning  Cognitive/Linguistic Baseline:  (? 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)   Cognition  Overall Cognitive Status: Impaired/Different from baseline Arousal/Alertness: Awake/alert Orientation Level: Oriented to person;Oriented to place Attention: Focused;Sustained;Selective Focused Attention: Appears intact Sustained Attention: Impaired Sustained Attention Impairment: Verbal basic Selective Attention: Impaired Selective Attention Impairment: Verbal basic Memory: Impaired Memory Impairment: Storage deficit;Retrieval deficit;Decreased recall of new information;Decreased short term memory Decreased Short Term Memory: Verbal basic Awareness: Impaired Problem Solving: Impaired Problem Solving Impairment: Verbal basic Executive Function: Reasoning;Organizing Reasoning: Impaired Reasoning Impairment: Verbal basic Organizing: Impaired Organizing Impairment: Verbal basic Behaviors: Poor frustration tolerance Safety/Judgment: Impaired Comments: 13/30 on MoCA    Comprehension  Auditory Comprehension Overall Auditory Comprehension: Appears within functional limits for tasks assessed     Expression Expression Primary Mode of Expression: Verbal Verbal Expression Overall Verbal Expression: Appears within functional  limits for tasks assessed   Oral / Motor  Oral Motor/Sensory Function Overall Oral Motor/Sensory Function: Within functional limits Motor Speech Overall Motor Speech: Appears within functional limits for tasks assessed    Danny Patterson 10/13/2015, 4:11 PM  Danny Patterson, MSP, CCC-SLP [phone]

## 2015-10-13 NOTE — Progress Notes (Signed)
[facility] PROGRESS NOTE  Danny Patterson [id number]:[id number] DOB: 1945-07-26 DOA: 10/01/2015 PCP: Fidel Levy, MD  Assessment/Plan:  1. Acute Hypoxic Respiratory Failure: Status post extubation Resolved.  2. Delirium: Multifactorial. Recommend continuing to avoid any sedating medications. Neuro to eval tomorrow. Stopped toradol. Sitter placed. Neg UA and CXR. MRI with possible artifact versus true stroke. 3. Post-operative Pain: Recommend Tylenol for relief of pain.   4. Aspiration Pneumonia: Zosyn 7 day course  Completed 7/22. IS ordered. 5. Hypokalemia: Plan to repeat electrolyte panel in a.m. 6. Hypernatremia , resolved:  Will continuing to trend electrolytes daily. 7. Hypertension: Restarting Norvasc at lower dose (5mg ) & Lasix daily. 8. Hyperlipidemia: Continuing Zocor. 9. Diabetes Mellitus Type 2: Holding Metformin. Switching accu-checks to qAC & HS w/ SSI per sensitive algorithm. 10. Urinary Retention: Cont Flomax. Continuing to hold on replacing Foley catheter, starting urinal. 11. Anemia: S/P 1u PRBC on 7/20. Improved minimally. Repeat 2u on 7/22. Good response. Neg occult stool. 12. Shock: Resolved. Likely medication induced. 13. Prophylaxis: SCDs. 14. Diet: Clear liquid diet. Advance as tolerated. 15. Slurred speech?: dgtr feels speech slurred, speech saw pt and states speech normal per eval, concern for cognitive decline beyond delirium; neuro to see pt in AM 16. Disposition: CIR likely vs SNF  Consultants:  Neuro  Procedures:  See EMR  Antibiotics:  Zosyn day 7 of 7 (indicate start date, and stop date if known)  HPI/Subjective: Pt family states concerned about delirium.  Dgtr again concerned speech sounds slurred to her. Denies pain.  Objective: Vitals:   10/13/15 1804 10/13/15 2150  BP: (!) 158/76 (!) 158/70  Pulse: 66 97  Resp: 18 20  Temp: 98.4 F (36.9 C) 97.3 F (36.3 C)    Intake/Output Summary (Last 24 hours) at 10/13/15 2259 Last data  filed at 10/13/15 1900  Gross per 24 hour  Intake          1091.67 ml  Output              850 ml  Net           241.67 ml   Filed Weights   10/08/15 0401 10/09/15 0200 10/10/15 0500  Weight: 104.7 kg (230 lb 13.2 oz) 105.7 kg (233 lb 0.4 oz) 105 kg (231 lb 7.7 oz)    Exam:  General:  No diaphoresis, anxious, no acute distress Cardiovascular: Regular rate and rhythm no murmurs rubs or gallops Respiratory: CTAB nl WOB Abdomen: Nondistended bowel sounds normal nontender palpation  Musculoskeletal: Moving all extremities, no deformity, 5 out of 5 strength   Skin: CDI dressing on R back   Data Reviewed: Basic Metabolic Panel:  Recent Labs Lab 10/09/15 0224 10/09/15 0225 10/10/15 0545 10/11/15 0415 10/11/15 0500 10/12/15 0425 10/13/15 0635  NA  --  143 146*  146*  --  143 141 143  K  --  3.9 3.2*  3.3*  --  3.2* 3.2* 3.3*  CL  --  120* 116*  117*  --  111 108 106  CO2  --  20* 24  23  --  25 25 26   GLUCOSE  --  139* 116*  116*  --  110* 103* 124*  BUN  --  21* 19  19  --  14 13 10   CREATININE  --  0.91 0.85  0.84  --  0.80 0.79 0.87  CALCIUM  --  8.7* 8.8*  8.8*  --  8.9 8.9 9.2  MG 1.9  --  1.8 1.8  --  1.6* 1.9  PHOS  --  3.4 2.9  --  3.3 2.9 2.8   Liver Function Tests:  Recent Labs Lab 10/09/15 0225 10/10/15 0545 10/11/15 0500 10/12/15 0425 10/13/15 0635  ALBUMIN 2.1* 2.1* 2.2* 2.4* 2.7*   No results for input(s): LIPASE, AMYLASE in the last 168 hours. No results for input(s): AMMONIA in the last 168 hours. CBC:  Recent Labs Lab 10/09/15 0224 10/09/15 0500 10/10/15 0545 10/11/15 0415 10/12/15 0425 10/13/15 0635  WBC 8.0  --  7.0 7.0 8.3 9.2  NEUTROABS 6.2  --  4.5 4.6 5.8 6.3  HGB 6.8* 6.5* 7.1* 7.0* 9.4* 10.4*  HCT 20.8* 19.0* 21.1* 20.7* 28.2* 31.4*  MCV 92.0  --  89.8 89.2 88.7 89.0  PLT 237  --  269 300 354 470*   Cardiac Enzymes:  Recent Labs Lab 10/07/15 0953 10/08/15 0934 10/09/15 0953  TROPONINI 0.23* 0.07* 0.04*   BNP  (last 3 results) No results for input(s): BNP in the last 8760 hours.  ProBNP (last 3 results) No results for input(s): PROBNP in the last 8760 hours.  CBG:  Recent Labs Lab 10/12/15 2144 10/13/15 0749 10/13/15 1103 10/13/15 1617 10/13/15 2213  GLUCAP 174* 141* 119* 164* 145*    Recent Results (from the past 240 hour(s))  Culture, blood (Routine X 2) w Reflex to ID Panel     Status: None   Collection Time: 10/05/15  4:10 PM  Result Value Ref Range Status   Specimen Description BLOOD A-LINE  Final   Special Requests BOTTLES DRAWN AEROBIC AND ANAEROBIC  Final   Culture NO GROWTH 5 DAYS  Final   Report Status 10/10/2015 FINAL  Final  Culture, blood (Routine X 2) w Reflex to ID Panel     Status: None   Collection Time: 10/05/15  6:28 PM  Result Value Ref Range Status   Specimen Description BLOOD RIGHT ARM  Final   Special Requests BOTTLES DRAWN AEROBIC AND ANAEROBIC 5CC  Final   Culture NO GROWTH 5 DAYS  Final   Report Status 10/10/2015 FINAL  Final  Culture, respiratory (NON-Expectorated)     Status: None   Collection Time: 10/05/15  7:01 PM  Result Value Ref Range Status   Specimen Description ENDOTRACHEAL  Final   Special Requests NONE  Final   Gram Stain   Final    ABUNDANT WBC PRESENT, PREDOMINANTLY PMN FEW SQUAMOUS EPITHELIAL CELLS PRESENT ABUNDANT GRAM POSITIVE COCCI IN CHAINS IN CLUSTERS FEW GRAM NEGATIVE RODS RARE GRAM NEGATIVE COCCI IN PAIRS    Culture Consistent with normal respiratory flora.  Final   Report Status 10/07/2015 FINAL  Final     Studies: Mr Brain Wo Contrast  Result Date: 10/13/2015 CLINICAL DATA:  Initial vol evaluation for slurred speech. Patient is status post recent spinal surgery complicated by a postoperative dural tear with CSF leak, hypoxia, shock. EXAM: MRI HEAD WITHOUT CONTRAST TECHNIQUE: Multiplanar, multiecho pulse sequences of the brain and surrounding structures were obtained without intravenous contrast. COMPARISON:  Prior CT  from 10/05/2015. FINDINGS: Study somewhat degraded by motion artifact. Mild diffuse prominence of the CSF containing spaces is compatible with generalized age-related cerebral atrophy. Patchy T2/FLAIR hyperintensity within the periventricular and deep white matter both cerebral hemispheres most likely related to chronic small vessel ischemic disease, mild nature. There is a possible punctate 4 mm focus of restricted diffusion within the cortical gray matter of the left frontoparietal region (series 9, image 15 on coronal sequence,  series 6, image 45 on axial sequence), which may reflect a tiny acute or early subacute ischemic infarct. No associated hemorrhage or mass effect. No other foci of restricted diffusion to suggest acute ischemia. Gray-white matter differentiation maintained. Major intracranial vascular flow voids are preserved. No acute intracranial hemorrhage. No areas of chronic infarction. Multiple sub cm foci susceptibility artifacts seen within the right temporal parietal region, most likely small chronic micro hemorrhages. These suspected to be related underlying hypertension. No mass lesion, midline shift, or mass effect. No hydrocephalus. No extra-axial fluid collection. Major dural sinuses are grossly patent. Craniocervical junction within normal limits. Visualized upper cervical spine unremarkable. Pituitary gland within normal limits. No acute abnormality about the globes and orbits. Mild scattered mucosal thickening within the ethmoidal air cells and sphenoid sinuses. Paranasal sinuses are otherwise clear. Bilateral mastoid effusions noted. Inner ear structures grossly unremarkable. Bone marrow signal intensity within normal limits. No scalp soft tissue abnormality. IMPRESSION: 1. Possible punctate 4 mm focus of restricted diffusion within the cortical gray matter of the left frontoparietal region, which may reflect a tiny acute or early subacute cortical infarct. No associated hemorrhage or  mass effect. 2. No other acute intracranial process. 3. Multiple subcentimeter chronic micro hemorrhages within the right parietotemporal region, most likely related to chronic underlying hypertension. 4. Mild age-related cerebral atrophy with chronic small vessel ischemic disease. Electronically Signed   By: Rise Mu M.D.   On: 10/13/2015 03:04  Dg Chest Port 1 View  Result Date: 10/13/2015 CLINICAL DATA:  Acute encephalopathy EXAM: PORTABLE CHEST 1 VIEW COMPARISON:  10/11/2015 FINDINGS: Cardiomegaly. Left Central line remains in place, unchanged. Mild bibasilar opacities, likely atelectasis. No visible effusions or acute bony abnormality. IMPRESSION: Decreasing lung volumes with increasing bibasilar atelectasis. Electronically Signed   By: Charlett Nose M.D.   On: 10/13/2015 09:50   Scheduled Meds: . amLODipine  5 mg Oral Daily  . feeding supplement (ENSURE ENLIVE)  237 mL Oral BID BM  . furosemide  20 mg Oral Daily  . insulin aspart  0-5 Units Subcutaneous QHS  . insulin aspart  0-9 Units Subcutaneous TID WC  . potassium chloride  20 mEq Oral Daily  . simvastatin  40 mg Oral Daily  . sodium chloride flush  10-40 mL Intracatheter Q12H  . tamsulosin  0.4 mg Oral Daily   Continuous Infusions:   Principal Problem:   Acute respiratory failure (HCC) Active Problems:   Hypertension   Diabetes (HCC)   Gout   Hyperlipidemia   Degenerative disc disease, lumbar   Acute delirium   Hypokalemia   Anemia   S/P lumbar fusion    Time spent: 30    Haydee Salter  [facility] Pager [phone]. If 7PM-7AM, please contact night-coverage at [email or web address], password [facility] 10/13/2015, 10:59 PM  LOS: 12 days

## 2015-10-13 NOTE — Progress Notes (Signed)
Security at bedside. Patient yelling on telephone "the police have been called on me". Patient still showing exhibiting paranoia and unconsolable, RN unable to reason with patient. MD ordered ativan IV. RN administered 0.5 mg ativan IV to patient per Dr. Melynda Ripple order. RN will closely monitor patient.

## 2015-10-13 NOTE — Care Management Note (Signed)
Case Management Note  Patient Details  Name: Danny Patterson MRN: [id number] Date of Birth: 1945/06/23  Subjective/Objective:                    Action/Plan: Pt continues with IV antibiotics. Has sitter due to confusion. Plan is for CIR when medically ready. CM continuing to follow for discharge needs.   Expected Discharge Date:                  Expected Discharge Plan:  IP Rehab Facility  In-House Referral:     Discharge planning Services  CM Consult  Post Acute Care Choice:    Choice offered to:     DME Arranged:    DME Agency:     HH Arranged:    HH Agency:     Status of Service:  In process, will continue to follow  If discussed at Long Length of Stay Meetings, dates discussed:    Additional Comments:  Kermit Balo, RN 10/13/2015, 1:15 PM

## 2015-10-13 NOTE — Progress Notes (Signed)
RN entered room due to bed alarm sounding. IV team RN in patient room. Patient gown off, sitting at the end of bed strattling base board. Patient states he "is getting out of here and no one can stop me. You people need to get the hell out of my way, I'm leaving." Patient ripped cardiac monitor off hand on [facility]. RN held iv tubing until IV team RN disconnected/capped LIJ off. Patient states people are trying to keep him here. RN unable to de-escalate or cal patient. Jeannett Senior, RN attempted to approach patient with no success. Patients daughter contacted due to request. Lyla Son (Patients daughter) unable to calm patient. Patient raising voice, swinging at staff. Consulting civil engineer notified.  MD paged, security called. RN, NT at bedside.

## 2015-10-13 NOTE — Progress Notes (Signed)
   10/13/15 1356  Urine Characteristics  Urine Color Yellow/straw  Urine Appearance Clear  Urine Odor No odor  Bladder Scan Volume (mL) 999 mL  Intermittent/Straight Cath (mL) 850 mL   Pt is due to void at 2000. Family at bedside.

## 2015-10-13 NOTE — Progress Notes (Signed)
Inpatient Rehabilitation  I visited pt., wife and daughter at the bedside to discuss the recommendation for IP rehab.  Pt. lethargic, sleeping soundly as he had been given ativan earlier due to confusion and restlessness.  I explained the CIR program to family and provided informational booklets.  I answered their questions. Wife and daughter would like to have pt. come to CIR when appropriate.   I will continue to follow along for medical readiness, ability to participate, bed availability and insurance approval.  Please call if questions.  Weldon Picking PT Inpatient Rehab Admissions Coordinator Cell [phone] Office [phone]

## 2015-10-13 NOTE — Progress Notes (Signed)
Per daughter Lyla Son at bedside, she requests for pt not to have any narc or sedation medication. She also request for same staffs to be with pt to cause less confusion. RN explained to pt's daughter that same staffs can be here 24 hrs with pt but current with a sitter to prevent fall. Dght wants staff to call her if pt's become more agitate and confuse. Sitter at bedside. Will continue to monitor.   Sim Boast, RN

## 2015-10-13 NOTE — Consult Note (Signed)
NAME:  BERNEL, MAYERNIK NO.:  [id number]  MEDICAL RECORD NO.:  [id number]  LOCATION:                                 FACILITY:  PHYSICIAN:  Hilda Lias, M.D.   DATE OF BIRTH:  [birth year]  DATE OF CONSULTATION:  10/13/2015 DATE OF DISCHARGE:                                CONSULTATION   I came this morning to see me Mr. Sedore at morning rounds and I found the hospitalist talking to the daughter and the wife.  They have been complaining about Mr. Puebla care.  They feel that he is not getting the treatment done that they were looking for.  One of the complaint is that at nighttime they are waking him up about 20-25 times to take blood pressure, pulse, etc and thus keeping him awake and confused.  Also, he has been seen by Physical Therapy, which they were expecting physical therapy as soon as he was leaving the ICU.  According to them, that has not happened.  The rehab center saw him and he is pending for admission. He is a candidate to go to rehab unit.  _still_________ mentally he is confused.  We do not know if this is probably secondary to ICU psychosis or what might be sundown.  I just  talked to the daughter and make her aware that __________ he had a CT scan of the head few days ago which was essentially negative.  I looked at all the medications, and there was no medication _which might change his________ mental condition, and I discontinued the tramadol__________ pain medication.  He is going to be only on Tylenol.  The hospitalist listened patiently to both of them.  He is going to start doing a complete workup to see if there is something metabolic to explain his condition __________.  Nevertheless, the only thing I told them what and I will try to call the neuro hospitalist to see there is something he can advise [place] about what to do next.  In the meantime, I do hope that the therapy will come and to start getting him out of  bed.    ______________________________ Hilda Lias, M.D.   ______________________________ Hilda Lias, M.D.    EB/MEDQ  D:  10/13/2015  T:  10/13/2015  Job:  [id number]

## 2015-10-13 NOTE — Progress Notes (Signed)
Patient ID: Danny Patterson, male   DOB: April 01, 1945, 70 y.o.   MRN: [id number] Note was dictated this am after a spoke with family. hospitalist help appreciated. Neurology to see.

## 2015-10-13 NOTE — Progress Notes (Signed)
SLP Cancellation Note  Patient Details Name: MAREO BROWNLOW MRN: [id number] DOB: 09-07-1945   Cancelled treatment:       Reason Eval/Treat Not Completed: Fatigue/lethargy limiting ability to participate (RN reports pt given ativan earlier due to confusion, restlessness) Pt currently inappropriate for evaluation due to somnolence. Will continue efforts.  Leigh Aurora 10/13/2015, 11:09 AM  Harlow Asa. Mur[facility], CCC-SLP [phone]

## 2015-10-14 DIAGNOSIS — E083513 Diabetes mellitus due to underlying condition with proliferative diabetic retinopathy with macular edema, bilateral: Secondary | ICD-10-CM | POA: Diagnosis not present

## 2015-10-14 DIAGNOSIS — Z794 Long term (current) use of insulin: Secondary | ICD-10-CM | POA: Diagnosis not present

## 2015-10-14 DIAGNOSIS — D649 Anemia, unspecified: Secondary | ICD-10-CM | POA: Diagnosis not present

## 2015-10-14 DIAGNOSIS — E1310 Other specified diabetes mellitus with ketoacidosis without coma: Secondary | ICD-10-CM | POA: Diagnosis not present

## 2015-10-14 DIAGNOSIS — R938 Abnormal findings on diagnostic imaging of other specified body structures: Secondary | ICD-10-CM | POA: Diagnosis not present

## 2015-10-14 DIAGNOSIS — G9340 Encephalopathy, unspecified: Secondary | ICD-10-CM | POA: Diagnosis not present

## 2015-10-14 DIAGNOSIS — J9601 Acute respiratory failure with hypoxia: Secondary | ICD-10-CM | POA: Diagnosis not present

## 2015-10-14 DIAGNOSIS — R4182 Altered mental status, unspecified: Secondary | ICD-10-CM | POA: Diagnosis not present

## 2015-10-14 DIAGNOSIS — R339 Retention of urine, unspecified: Secondary | ICD-10-CM | POA: Diagnosis not present

## 2015-10-14 DIAGNOSIS — R410 Disorientation, unspecified: Secondary | ICD-10-CM | POA: Diagnosis not present

## 2015-10-14 DIAGNOSIS — E785 Hyperlipidemia, unspecified: Secondary | ICD-10-CM | POA: Diagnosis not present

## 2015-10-14 LAB — RENAL FUNCTION PANEL
ALBUMIN: 2.5 g/dL — AB (ref 3.5–5.0)
ANION GAP: 7 (ref 5–15)
BUN: 7 mg/dL (ref 6–20)
CO2: 27 mmol/L (ref 22–32)
Calcium: 8.9 mg/dL (ref 8.9–10.3)
Chloride: 107 mmol/L (ref 101–111)
Creatinine, Ser: 0.7 mg/dL (ref 0.61–1.24)
GFR calc Af Amer: >60 mL/min (ref >60)
Glucose, Bld: 114 mg/dL — ABNORMAL HIGH (ref 65–99)
PHOSPHORUS: 2.8 mg/dL (ref 2.5–4.6)
POTASSIUM: 3.2 mmol/L — AB (ref 3.5–5.1)
Sodium: 141 mmol/L (ref 135–145)

## 2015-10-14 LAB — CBC WITH DIFFERENTIAL/PLATELET
BASOS ABS: 0 10*3/uL (ref 0.0–0.1)
BASOS PCT: 1 %
Eosinophils Absolute: 0.3 10*3/uL (ref 0.0–0.7)
Eosinophils Relative: 4 %
HEMATOCRIT: 28.9 % — AB (ref 39.0–52.0)
HEMOGLOBIN: 9.4 g/dL — AB (ref 13.0–17.0)
Lymphocytes Relative: 21 %
Lymphs Abs: 1.6 10*3/uL (ref 0.7–4.0)
MCH: 29.2 pg (ref 26.0–34.0)
MCHC: 32.5 g/dL (ref 30.0–36.0)
MCV: 89.8 fL (ref 78.0–100.0)
Monocytes Absolute: 0.6 10*3/uL (ref 0.1–1.0)
Monocytes Relative: 8 %
NEUTROS ABS: 4.9 10*3/uL (ref 1.7–7.7)
NEUTROS PCT: 66 %
Platelets: 439 10*3/uL — ABNORMAL HIGH (ref 150–400)
RBC: 3.22 MIL/uL — AB (ref 4.22–5.81)
RDW: 14 % (ref 11.5–15.5)
WBC: 7.4 10*3/uL (ref 4.0–10.5)

## 2015-10-14 LAB — GLUCOSE, CAPILLARY
Glucose-Capillary: 110 mg/dL — ABNORMAL HIGH (ref 65–99)
Glucose-Capillary: 127 mg/dL — ABNORMAL HIGH (ref 65–99)
Glucose-Capillary: 120 mg/dL — ABNORMAL HIGH (ref 65–99)
Glucose-Capillary: 160 mg/dL — ABNORMAL HIGH (ref 65–99)

## 2015-10-14 LAB — MAGNESIUM: MAGNESIUM: 1.6 mg/dL — AB (ref 1.7–2.4)

## 2015-10-14 MED ORDER — MAGNESIUM SULFATE 2 GM/50ML IV SOLN
2.0000 g | Freq: Once | INTRAVENOUS | Status: AC
  Administered 2015-10-14: 2 g via INTRAVENOUS
  Filled 2015-10-14: qty 50

## 2015-10-14 MED ORDER — POTASSIUM CHLORIDE CRYS ER 20 MEQ PO TBCR
40.0000 meq | EXTENDED_RELEASE_TABLET | Freq: Once | ORAL | Status: AC
  Administered 2015-10-14: 40 meq via ORAL
  Filled 2015-10-14: qty 2

## 2015-10-14 MED ORDER — POTASSIUM CHLORIDE CRYS ER 20 MEQ PO TBCR
40.0000 meq | EXTENDED_RELEASE_TABLET | Freq: Every day | ORAL | Status: DC
  Administered 2015-10-14 – 2015-10-15 (×2): 40 meq via ORAL
  Filled 2015-10-14 (×2): qty 2

## 2015-10-14 MED ORDER — ASPIRIN 325 MG PO TABS
325.0000 mg | ORAL_TABLET | Freq: Every day | ORAL | Status: DC
  Administered 2015-10-14 – 2015-10-15 (×2): 325 mg via ORAL
  Filled 2015-10-14: qty 1

## 2015-10-14 NOTE — Progress Notes (Signed)
Inpatient Rehabilitation  I have initiated insurance authorization process and await a decision from [facility] [employer] for possible IP rehab admission pending medical readiness and bed availability.  I have updated the patient and his daughter.   Please call if questions.  Weldon Picking PT Inpatient Rehab Admissions Coordinator Cell [phone] Office [phone]

## 2015-10-14 NOTE — Progress Notes (Signed)
[facility] PROGRESS NOTE  Danny Patterson AVW:[id number] DOB: 17-Mar-1946 DOA: 10/01/2015 PCP: Fidel Levy, MD  Today's Plan: Magnesium IV, PT/OT, try to d/c sitter, stroke team might see pt Try to d/c tomorrow  Overview: 70yo male with hx HTN, DM, gout, chronic lumbar back pain with radiation to the R foot.  He underwent lumbar fusion 7/12 by Dr. Jeral Fruit.  He was extubated post op but is to remain flat and in the afternoon post op he developed some hypoxia and PCCM consulted. Hypoxia thought to be due to OSA, sleep study as OP. Pt developed AMS, thought to be due to pain meds. Pt then found to have HCAP, 7 days of zosyn. Was intubated and later extubated. Briefly with shock and on levophed. TEED done that ruled out cardiac issues. 7/20 pt transfused 1u PRBCS. 7/22 transfused 2u prbcs. Pt had continued delirium. Meds de-escalated further. MRI shows no likely cause for MS change. Speech consulted for family concern of slurred speech. Neg workup.  Neuro eval patient. Recs - Carotid, repeat ECHO and ASA advised. Stroke team will not see patient.   D/c tomorrow.  Assessment/Plan:  1. Acute Hypoxic Respiratory Failure: Status post extubation Resolved.  2. Delirium->Lewy body dementia: Multifactorial. Recommend continuing to avoid any sedating medications. Stopped toradol. Sitter to be stopped. Neg UA and CXR. MRI with possible artifact versus true stroke. Neuro complete, state pt has dx of dementia. ECHO & Carotids ordered.  Improved. 3. Post-operative Pain: Recommend Tylenol for relief of pain.   4. Aspiration Pneumonia: Zosyn 7 day course  Completed 7/22. IS ordered. 5. Hypokalemia: Plan to repeat electrolyte panel in a.m. 6. Hypernatremia , resolved:  Will continuing to trend electrolytes daily. 7. Hypertension: Restarting Norvasc at lower dose (5mg ) & Lasix daily. 8. Hyperlipidemia: Continuing Zocor. 9. Diabetes Mellitus Type 2: Holding Metformin. Switching accu-checks to qAC & HS w/  SSI per sensitive algorithm. 10. Urinary Retention: Cont Flomax. Continuing to hold on replacing Foley catheter, starting urinal. 11. Anemia: S/P 1u PRBC on 7/20. Improved minimally. Repeat 2u on 7/22. Good response. Neg occult stool. 12. Shock: Resolved. Likely medication induced. 13. Prophylaxis: SCDs. 14. Diet: Clear liquid diet. Advance as tolerated. 15. Slurred speech?: dgtr feels speech slurred, speech saw pt and states speech normal per eval, concern for cognitive decline beyond delirium 16. Low Mg: replaced IV and recheck 17. Snoring - OP sleep study on d/c 18. LE muscle spasm: fix electrolytes, oral hydration, mobilization 19. Disposition: CIR likely vs SNF  Consultants:  Neuro  Procedures:  See EMR  Antibiotics:  Zosyn day 7 of 7 (indicate start date, and stop date if known)  HPI/Subjective: Pt states he is well. Complains of leg cramps. No fever or SOB.  Objective: Vitals:   10/14/15 0632 10/14/15 0955  BP: (!) 164/72 (!) 160/78  Pulse: 92 93  Resp: 18 18  Temp: 98.4 F (36.9 C) 99.3 F (37.4 C)    Intake/Output Summary (Last 24 hours) at 10/14/15 1035 Last data filed at 10/14/15 0648  Gross per 24 hour  Intake          1041.67 ml  Output             1425 ml  Net          -383.33 ml   Filed Weights   10/08/15 0401 10/09/15 0200 10/10/15 0500  Weight: 104.7 kg (230 lb 13.2 oz) 105.7 kg (233 lb 0.4 oz) 105 kg (231 lb 7.7 oz)  Exam:  General:  No diaphoresis, anxious, no acute distress Cardiovascular: Regular rate and rhythm no murmurs rubs or gallops Respiratory: CTAB nl WOB Abdomen: Nondistended bowel sounds normal nontender palpation  Musculoskeletal: Moving all extremities, no deformity, 5 out of 5 strength, thigh muscle spasm on exam  Skin: CDI dressing on R back   Data Reviewed: Basic Metabolic Panel:  Recent Labs Lab 10/10/15 0545 10/11/15 0415 10/11/15 0500 10/12/15 0425 10/13/15 0635 10/14/15 0446  NA 146*  146*  --  143  141 143 141  K 3.2*  3.3*  --  3.2* 3.2* 3.3* 3.2*  CL 116*  117*  --  111 108 106 107  CO2 24  23  --  25 25 26 27   GLUCOSE 116*  116*  --  110* 103* 124* 114*  BUN 19  19  --  14 13 10 7   CREATININE 0.85  0.84  --  0.80 0.79 0.87 0.70  CALCIUM 8.8*  8.8*  --  8.9 8.9 9.2 8.9  MG 1.8 1.8  --  1.6* 1.9 1.6*  PHOS 2.9  --  3.3 2.9 2.8 2.8   Liver Function Tests:  Recent Labs Lab 10/10/15 0545 10/11/15 0500 10/12/15 0425 10/13/15 0635 10/14/15 0446  ALBUMIN 2.1* 2.2* 2.4* 2.7* 2.5*   No results for input(s): LIPASE, AMYLASE in the last 168 hours. No results for input(s): AMMONIA in the last 168 hours. CBC:  Recent Labs Lab 10/10/15 0545 10/11/15 0415 10/12/15 0425 10/13/15 0635 10/14/15 0446  WBC 7.0 7.0 8.3 9.2 7.4  NEUTROABS 4.5 4.6 5.8 6.3 4.9  HGB 7.1* 7.0* 9.4* 10.4* 9.4*  HCT 21.1* 20.7* 28.2* 31.4* 28.9*  MCV 89.8 89.2 88.7 89.0 89.8  PLT 269 300 354 470* 439*   Cardiac Enzymes:  Recent Labs Lab 10/08/15 0934 10/09/15 0953  TROPONINI 0.07* 0.04*   BNP (last 3 results) No results for input(s): BNP in the last 8760 hours.  ProBNP (last 3 results) No results for input(s): PROBNP in the last 8760 hours.  CBG:  Recent Labs Lab 10/13/15 0749 10/13/15 1103 10/13/15 1617 10/13/15 2213 10/14/15 0619  GLUCAP 141* 119* 164* 145* 110*    Recent Results (from the past 240 hour(s))  Culture, blood (Routine X 2) w Reflex to ID Panel     Status: None   Collection Time: 10/05/15  4:10 PM  Result Value Ref Range Status   Specimen Description BLOOD A-LINE  Final   Special Requests BOTTLES DRAWN AEROBIC AND ANAEROBIC  Final   Culture NO GROWTH 5 DAYS  Final   Report Status 10/10/2015 FINAL  Final  Culture, blood (Routine X 2) w Reflex to ID Panel     Status: None   Collection Time: 10/05/15  6:28 PM  Result Value Ref Range Status   Specimen Description BLOOD RIGHT ARM  Final   Special Requests BOTTLES DRAWN AEROBIC AND ANAEROBIC 5CC  Final    Culture NO GROWTH 5 DAYS  Final   Report Status 10/10/2015 FINAL  Final  Culture, respiratory (NON-Expectorated)     Status: None   Collection Time: 10/05/15  7:01 PM  Result Value Ref Range Status   Specimen Description ENDOTRACHEAL  Final   Special Requests NONE  Final   Gram Stain   Final    ABUNDANT WBC PRESENT, PREDOMINANTLY PMN FEW SQUAMOUS EPITHELIAL CELLS PRESENT ABUNDANT GRAM POSITIVE COCCI IN CHAINS IN CLUSTERS FEW GRAM NEGATIVE RODS RARE GRAM NEGATIVE COCCI IN PAIRS    Culture Consistent  with normal respiratory flora.  Final   Report Status 10/07/2015 FINAL  Final     Studies: Mr Brain Wo Contrast  Result Date: 10/13/2015 CLINICAL DATA:  Initial vol evaluation for slurred speech. Patient is status post recent spinal surgery complicated by a postoperative dural tear with CSF leak, hypoxia, shock. EXAM: MRI HEAD WITHOUT CONTRAST TECHNIQUE: Multiplanar, multiecho pulse sequences of the brain and surrounding structures were obtained without intravenous contrast. COMPARISON:  Prior CT from 10/05/2015. FINDINGS: Study somewhat degraded by motion artifact. Mild diffuse prominence of the CSF containing spaces is compatible with generalized age-related cerebral atrophy. Patchy T2/FLAIR hyperintensity within the periventricular and deep white matter both cerebral hemispheres most likely related to chronic small vessel ischemic disease, mild nature. There is a possible punctate 4 mm focus of restricted diffusion within the cortical gray matter of the left frontoparietal region (series 9, image 15 on coronal sequence, series 6, image 45 on axial sequence), which may reflect a tiny acute or early subacute ischemic infarct. No associated hemorrhage or mass effect. No other foci of restricted diffusion to suggest acute ischemia. Gray-white matter differentiation maintained. Major intracranial vascular flow voids are preserved. No acute intracranial hemorrhage. No areas of chronic infarction.  Multiple sub cm foci susceptibility artifacts seen within the right temporal parietal region, most likely small chronic micro hemorrhages. These suspected to be related underlying hypertension. No mass lesion, midline shift, or mass effect. No hydrocephalus. No extra-axial fluid collection. Major dural sinuses are grossly patent. Craniocervical junction within normal limits. Visualized upper cervical spine unremarkable. Pituitary gland within normal limits. No acute abnormality about the globes and orbits. Mild scattered mucosal thickening within the ethmoidal air cells and sphenoid sinuses. Paranasal sinuses are otherwise clear. Bilateral mastoid effusions noted. Inner ear structures grossly unremarkable. Bone marrow signal intensity within normal limits. No scalp soft tissue abnormality. IMPRESSION: 1. Possible punctate 4 mm focus of restricted diffusion within the cortical gray matter of the left frontoparietal region, which may reflect a tiny acute or early subacute cortical infarct. No associated hemorrhage or mass effect. 2. No other acute intracranial process. 3. Multiple subcentimeter chronic micro hemorrhages within the right parietotemporal region, most likely related to chronic underlying hypertension. 4. Mild age-related cerebral atrophy with chronic small vessel ischemic disease. Electronically Signed   By: Rise Mu M.D.   On: 10/13/2015 03:04  Dg Chest Port 1 View  Result Date: 10/13/2015 CLINICAL DATA:  Acute encephalopathy EXAM: PORTABLE CHEST 1 VIEW COMPARISON:  10/11/2015 FINDINGS: Cardiomegaly. Left Central line remains in place, unchanged. Mild bibasilar opacities, likely atelectasis. No visible effusions or acute bony abnormality. IMPRESSION: Decreasing lung volumes with increasing bibasilar atelectasis. Electronically Signed   By: Charlett Nose M.D.   On: 10/13/2015 09:50   Scheduled Meds: . amLODipine  5 mg Oral Daily  . feeding supplement (ENSURE ENLIVE)  237 mL Oral BID  BM  . furosemide  20 mg Oral Daily  . insulin aspart  0-5 Units Subcutaneous QHS  . insulin aspart  0-9 Units Subcutaneous TID WC  . magnesium sulfate 1 - 4 g bolus IVPB  2 g Intravenous Once  . potassium chloride  40 mEq Oral Daily  . potassium chloride  40 mEq Oral Once  . simvastatin  40 mg Oral Daily  . sodium chloride flush  10-40 mL Intracatheter Q12H  . tamsulosin  0.4 mg Oral Daily   Continuous Infusions:   Principal Problem:   Acute respiratory failure (HCC) Active Problems:   Hypertension  Diabetes (HCC)   Gout   Hyperlipidemia   Degenerative disc disease, lumbar   Acute encephalopathy   Hypokalemia   Anemia   S/P lumbar fusion   Altered mental status    Time spent: 30    Haydee Salter  [facility] Pager [phone]. If 7PM-7AM, please contact night-coverage at [email or web address], password [facility] 10/14/2015, 10:35 AM  LOS: 13 days

## 2015-10-14 NOTE — Progress Notes (Signed)
Physical Therapy Treatment Patient Details Name: Danny Patterson MRN: [id number] DOB: 06-21-1945 Today's Date: 10/14/2015    History of Present Illness Patient is a 70 yo male s/p Lumbar three-four, Lumbar four-five, Lumbar five-Sacrum one posterior lumbar interbody fusion with Laminotomy at Lumbar Two-Three complicated by post op dural tear/csf leak and then required intubation for respiratory insufficiency, hypoxia, shock, extubated 7/20.    PT Comments    Pt with report of increased pain today limiting ambulation to 74ft prior to needing rest break. He remains unaware that he had back surgery and does not recall any back precautions despite being reoriented and reeducated. Pt to continue to benefit from CIR to address decreased awareness of deficits, safety awareness, decreased mobility, decreased strength, and progress independence with mobility.    Follow Up Recommendations  Supervision/Assistance - 24 hour;CIR     Equipment Recommendations  Rolling walker with 5" wheels;3in1 (PT)    Recommendations for Other Services Rehab consult     Precautions / Restrictions Precautions Precautions: Back Precaution Comments: Pt does not remember back surgery. Pt educated on use of brace. Required Braces or Orthoses: Spinal Brace Spinal Brace: Lumbar corset;Applied in supine position (Pt min assist with brace) Restrictions Weight Bearing Restrictions: No    Mobility  Bed Mobility Overal bed mobility: Needs Assistance Bed Mobility: Rolling;Sidelying to Sit Rolling: Mod assist Sidelying to sit: Max assist       General bed mobility comments: Pt initiated rolling but required mod assist to achieve sidelying and max assist for trunk elevation and LE movement.   Transfers Overall transfer level: Needs assistance Equipment used: Rolling walker (2 wheeled) Transfers: Sit to/from Stand Sit to Stand: Mod assist         General transfer comment: Pt required mod verbal cues for hand  placement and to stand up straight once at walker.  Ambulation/Gait Ambulation/Gait assistance: Mod assist;Min assist (Mod A to initiate ambulation, min A during ambulation) Ambulation Distance (Feet): 30 Feet (x 2 ) Assistive device: Rolling walker (2 wheeled) Gait Pattern/deviations: Step-to pattern;Decreased stride length;Shuffle Gait velocity: slow Gait velocity interpretation: Below normal speed for age/gender General Gait Details: Pt increased awareness of walker placement, min A walker managment, min verbal cues for body placement in walker.+2 for chair follow.    Stairs            Wheelchair Mobility    Modified Rankin (Stroke Patients Only)       Balance Overall balance assessment: Needs assistance Sitting-balance support: Feet supported;Single extremity supported Sitting balance-Leahy Scale: Poor Sitting balance - Comments: Pt able to support with UE on EOB while donning gown.   Standing balance support: Bilateral upper extremity supported;During functional activity Standing balance-Leahy Scale: Poor                      Cognition Arousal/Alertness: Awake/alert Behavior During Therapy: Agitated;WFL for tasks assessed/performed (More easily agitated by end of session.) Overall Cognitive Status: Impaired/Different from baseline Area of Impairment: Memory;Safety/judgement;Problem solving;Following commands;Attention   Current Attention Level: Sustained Memory: Decreased recall of precautions;Decreased short-term memory Following Commands: Follows one step commands consistently;Follows one step commands with increased time Safety/Judgement: Decreased awareness of deficits;Decreased awareness of safety Awareness: Emergent Problem Solving: Slow processing;Difficulty sequencing;Requires verbal cues;Requires tactile cues General Comments: Pt oriented to time but not to location or situation.     Exercises      General Comments General comments (skin  integrity, edema, etc.): Pt happy to "walk for the first time in  months"      Pertinent Vitals/Pain Pain Assessment: 0-10 Pain Score: 7  Pain Location: bottom and back  Pain Descriptors / Indicators: Discomfort;Grimacing;Operative site guarding Pain Intervention(s): Monitored during session;Premedicated before session    Home Living                      Prior Function            PT Goals (current goals can now be found in the care plan section) Acute Rehab PT Goals Patient Stated Goal: none stated PT Goal Formulation: With patient Time For Goal Achievement: 10/24/15 Potential to Achieve Goals: Good Progress towards PT goals: Progressing toward goals    Frequency  Min 5X/week    PT Plan Current plan remains appropriate    Co-evaluation             End of Session Equipment Utilized During Treatment: Gait belt;Back brace Activity Tolerance: Patient limited by fatigue Patient left: in chair;with chair alarm set;with nursing/sitter in room;with family/visitor present     Time: 4098-1191 PT Time Calculation (min) (ACUTE ONLY): 29 min  Charges:  $Gait Training: 8-22 mins $Therapeutic Activity: 8-22 mins                    G Codes:      Dorethy Tomey 2015-10-21, 12:19 PM  Park Liter, SPT (student physical therapist) Acute Rehabilitation Services [phone]

## 2015-10-14 NOTE — Consult Note (Addendum)
Initial Neurological Consultation                      NEURO HOSPITALIST CONSULT NOTE   Requestig physician: Dr. Melynda Ripple   Reason for Consult:Altered mental status   HPI:                                                                                                                                          Danny Patterson is an 70 y.o. male who was admitted to the hospital on 10/11/2015 for a laminectomy. After the surgery Danny Patterson was noted to have some confusion and disorientation. He was also noted to have some agitation and somewhat odd behavior. Standard evaluations revealed no clear explanation for his symptoms.  MRI of the brain was performed and revealed the presence of a possible tiny acute or early subacute cortical infarct with no mass effect in the left frontoparietal region several subcentimeter chronic microhemorrhages were noted in the right parietotemporal region. These changes did not appear sufficient to explain Danny Patterson symptoms.  On further discussion with the areas wife and his daughter they report the Danny Patterson has been active and has been going to work but had experienced some changes in his personality. They note that he is far more irritable than he used to be. They also note that he sometimes becomes slightly confused during conversations. They report he has a tendency to repeat himself. He will sometimes loses track of conversations as well.  Areas wife reports that she had noted some changes in his gait she reports that he sometimes seemed a bit bent over and had short shuffling steps and would frequently lose his balance. Danny Patterson's wife had attributed these changes in his gait to his back pain and his need for the lumbar laminectomy. However Danny Patterson was also experiencing some sensations in which she would sometimes hear things in another room such as someone speaking or moving about when he would investigate he could find no clear explanation for these noises. Danny Patterson reports that sometimes he  would hear someone on the front porch when he would investigate no one was there. Danny Patterson reports that sometimes he will see a shadow were small animal in front of him and that I will simply disappear. Danny Patterson's wife reports that Danny Patterson had mentioned these things to her but she did not have any explanation for these symptoms that have been occurring in the past several months.  Danny Patterson's wife reports that he also has been experiencing a slight tremor. His coordination does not seem as good as it has been in the past. She reports that sometimes he will seem a bit lightheaded or unsteady when he gets up out of chairs.  There is wife reports that he is also experiencing fairly vivid dreams and he will sometimes act out his stream's at night. He has a significant issue with snoring and sometimes  does not seem to be breathing at night. Danny Patterson's daughter reports that she has noted similar symptoms as well.     Past Medical History:  Diagnosis Date  . Allergy   . Arthritis   . Diabetes mellitus without complication (HCC)   . Gout   . Hyperlipidemia   . Hypertension   . Kidney stone   . Rheumatic fever     Past Surgical History:  Procedure Laterality Date  . BACK SURGERY    . knot     removed from neck  . POSTERIOR LUMBAR FUSION 4 LEVEL N/A 10/01/2015   Procedure: Lumbar three-four,  Lumbar four-five,  Lumbar five-Sacrum one posterior lumbar interbody fusion with Laminotomy at Lumbar Two-Three;  Surgeon: Hilda Lias, MD;  Location: [facility];  Service: Neurosurgery;  Laterality: N/A;    MEDICATIONS:                                                                                                                     I have reviewed the patient's current medications.  Allergies  Allergen Reactions  . Ace Inhibitors Other (See Comments)    Angioedema.     Social History:  reports that he has never smoked. He has never used smokeless tobacco. He reports that he drinks about 1.8 oz of alcohol per  week . He reports that he does not use drugs.  Family History  Problem Relation Age of Onset  . Parkinson's disease Father 48  . Kidney disease Maternal Grandmother      ROS:                                                                                                                                       History obtained from chart review  General ROS: negative for - chills, fatigue, fever, night sweats, weight gain or weight loss Psychological ROS: negative for - behavioral disorder, hallucinations, memory difficulties, mood swings or suicidal ideation Ophthalmic ROS: negative for - blurry vision, double vision, eye pain or loss of vision ENT ROS: negative for - epistaxis, nasal discharge, oral lesions, sore throat, tinnitus or vertigo Allergy and Immunology ROS: negative for - hives or itchy/watery eyes Hematological and Lymphatic ROS: negative for - bleeding problems, bruising or swollen lymph nodes Endocrine ROS: negative for - galactorrhea, hair pattern changes, polydipsia/polyuria or temperature intolerance Respiratory ROS: negative for - cough, hemoptysis, shortness of breath or wheezing  Cardiovascular ROS: negative for - chest pain, dyspnea on exertion, edema or irregular heartbeat Gastrointestinal ROS: negative for - abdominal pain, diarrhea, hematemesis, nausea/vomiting or stool incontinence Genito-Urinary ROS: negative for - dysuria, hematuria, incontinence or urinary frequency/urgency Musculoskeletal ROS: negative for - joint swelling or muscular weakness Neurological ROS: as noted in HPI Dermatological ROS: negative for rash and skin lesion changes   General Exam                                                                                                      Blood pressure (!) 164/72, pulse 92, temperature 98.4 F (36.9 C), temperature source Oral, resp. rate 18, height 5\' 10"  (1.778 m), weight 105 kg (231 lb 7.7 oz), SpO2 97 %. HEENT-  Normocephalic, no lesions,  without obvious abnormality.  Normal external eye and conjunctiva.  Normal TM's bilaterally.  Normal auditory canals and external ears. Normal external nose, mucus membranes and septum.  Normal pharynx. Cardiovascular- regular rate and rhythm, S1, S2 normal, no murmur, click, rub or gallop, pulses palpable throughout   Lungs- chest clear, no wheezing, rales, normal symmetric air entry, Heart exam - S1, S2 normal, no murmur, no gallop, rate regular Abdomen- soft, non-tender; bowel sounds normal; no masses,  no organomegaly Extremities- less then 2 second capillary refill Lymph-no adenopathy palpable Musculoskeletal-no joint tenderness, deformity or swelling Skin-warm and dry, no hyperpigmentation, vitiligo, or suspicious lesions  Neurological Examination  Cognitive function: There is awake alert and oriented he is able to follow commands and answer basic questions. He was noted to have some mild to moderate difficulties on more detailed Mini-Mental Status questions  Cranial nerves: Cranial nerves I through XII were intact olfactory function was slightly diminished. Pupils are equal round reactive to light and accommodation fundi are benign extraocular movements revealed minimal limitation of vertical gaze. There is no facial asymmetry. Hearing is intact. Neck flexion and shoulder shrug are 5 out of 5. Tongue movements are full.  Motor function: Motor function is 5 out of 5 in all 4 extremities  Sensory function: Sensation is intact to pinprick and light touch  Coordination: Mild tremor was noted on finger to nose testing repetitive hand opening and closing revealed bradykinesia and progressive decrease in amplitude of movements. Mirror movements were also noted. Gait was not assessed  Tone: Tone was increased with moderate cogwheeling and rigidity    Lab Results: Basic Metabolic Panel:  Recent Labs Lab 10/10/15 0545 10/11/15 0415 10/11/15 0500 10/12/15 0425 10/13/15 0635  10/14/15 0446  NA 146*  146*  --  143 141 143 141  K 3.2*  3.3*  --  3.2* 3.2* 3.3* 3.2*  CL 116*  117*  --  111 108 106 107  CO2 24  23  --  25 25 26 27   GLUCOSE 116*  116*  --  110* 103* 124* 114*  BUN 19  19  --  14 13 10 7   CREATININE 0.85  0.84  --  0.80 0.79 0.87 0.70  CALCIUM 8.8*  8.8*  --  8.9 8.9 9.2 8.9  MG  1.8 1.8  --  1.6* 1.9 1.6*  PHOS 2.9  --  3.3 2.9 2.8 2.8    Liver Function Tests:  Recent Labs Lab 10/10/15 0545 10/11/15 0500 10/12/15 0425 10/13/15 0635 10/14/15 0446  ALBUMIN 2.1* 2.2* 2.4* 2.7* 2.5*   No results for input(s): LIPASE, AMYLASE in the last 168 hours. No results for input(s): AMMONIA in the last 168 hours.  CBC:  Recent Labs Lab 10/10/15 0545 10/11/15 0415 10/12/15 0425 10/13/15 0635 10/14/15 0446  WBC 7.0 7.0 8.3 9.2 7.4  NEUTROABS 4.5 4.6 5.8 6.3 4.9  HGB 7.1* 7.0* 9.4* 10.4* 9.4*  HCT 21.1* 20.7* 28.2* 31.4* 28.9*  MCV 89.8 89.2 88.7 89.0 89.8  PLT 269 300 354 470* 439*    Cardiac Enzymes:  Recent Labs Lab 10/07/15 0953 10/08/15 0934 10/09/15 0953  TROPONINI 0.23* 0.07* 0.04*    Lipid Panel: No results for input(s): CHOL, TRIG, HDL, CHOLHDL, VLDL, LDLCALC in the last 168 hours.  CBG:  Recent Labs Lab 10/13/15 0749 10/13/15 1103 10/13/15 1617 10/13/15 2213 10/14/15 0619  GLUCAP 141* 119* 164* 145* 110*    Microbiology: Results for orders placed or performed during the hospital encounter of 10/01/15  Culture, blood (Routine X 2) w Reflex to ID Panel     Status: None   Collection Time: 10/05/15  4:10 PM  Result Value Ref Range Status   Specimen Description BLOOD A-LINE  Final   Special Requests BOTTLES DRAWN AEROBIC AND ANAEROBIC  Final   Culture NO GROWTH 5 DAYS  Final   Report Status 10/10/2015 FINAL  Final  Culture, blood (Routine X 2) w Reflex to ID Panel     Status: None   Collection Time: 10/05/15  6:28 PM  Result Value Ref Range Status   Specimen Description BLOOD RIGHT ARM  Final    Special Requests BOTTLES DRAWN AEROBIC AND ANAEROBIC 5CC  Final   Culture NO GROWTH 5 DAYS  Final   Report Status 10/10/2015 FINAL  Final  Culture, respiratory (NON-Expectorated)     Status: None   Collection Time: 10/05/15  7:01 PM  Result Value Ref Range Status   Specimen Description ENDOTRACHEAL  Final   Special Requests NONE  Final   Gram Stain   Final    ABUNDANT WBC PRESENT, PREDOMINANTLY PMN FEW SQUAMOUS EPITHELIAL CELLS PRESENT ABUNDANT GRAM POSITIVE COCCI IN CHAINS IN CLUSTERS FEW GRAM NEGATIVE RODS RARE GRAM NEGATIVE COCCI IN PAIRS    Culture Consistent with normal respiratory flora.  Final   Report Status 10/07/2015 FINAL  Final    Coagulation Studies: No results for input(s): LABPROT, INR in the last 72 hours.  Imaging: Mr Brain Wo Contrast  Result Date: 10/13/2015 CLINICAL DATA:  Initial vol evaluation for slurred speech. Patient is status post recent spinal surgery complicated by a postoperative dural tear with CSF leak, hypoxia, shock. EXAM: MRI HEAD WITHOUT CONTRAST TECHNIQUE: Multiplanar, multiecho pulse sequences of the brain and surrounding structures were obtained without intravenous contrast. COMPARISON:  Prior CT from 10/05/2015. FINDINGS: Study somewhat degraded by motion artifact. Mild diffuse prominence of the CSF containing spaces is compatible with generalized age-related cerebral atrophy. Patchy T2/FLAIR hyperintensity within the periventricular and deep white matter both cerebral hemispheres most likely related to chronic small vessel ischemic disease, mild nature. There is a possible punctate 4 mm focus of restricted diffusion within the cortical gray matter of the left frontoparietal region (series 9, image 15 on coronal sequence, series 6, image 45 on axial sequence), which  may reflect a tiny acute or early subacute ischemic infarct. No associated hemorrhage or mass effect. No other foci of restricted diffusion to suggest acute ischemia. Gray-white matter  differentiation maintained. Major intracranial vascular flow voids are preserved. No acute intracranial hemorrhage. No areas of chronic infarction. Multiple sub cm foci susceptibility artifacts seen within the right temporal parietal region, most likely small chronic micro hemorrhages. These suspected to be related underlying hypertension. No mass lesion, midline shift, or mass effect. No hydrocephalus. No extra-axial fluid collection. Major dural sinuses are grossly patent. Craniocervical junction within normal limits. Visualized upper cervical spine unremarkable. Pituitary gland within normal limits. No acute abnormality about the globes and orbits. Mild scattered mucosal thickening within the ethmoidal air cells and sphenoid sinuses. Paranasal sinuses are otherwise clear. Bilateral mastoid effusions noted. Inner ear structures grossly unremarkable. Bone marrow signal intensity within normal limits. No scalp soft tissue abnormality. IMPRESSION: 1. Possible punctate 4 mm focus of restricted diffusion within the cortical gray matter of the left frontoparietal region, which may reflect a tiny acute or early subacute cortical infarct. No associated hemorrhage or mass effect. 2. No other acute intracranial process. 3. Multiple subcentimeter chronic micro hemorrhages within the right parietotemporal region, most likely related to chronic underlying hypertension. 4. Mild age-related cerebral atrophy with chronic small vessel ischemic disease. Electronically Signed   By: Rise Mu M.D.   On: 10/13/2015 03:04  Dg Chest Port 1 View  Result Date: 10/13/2015 CLINICAL DATA:  Acute encephalopathy EXAM: PORTABLE CHEST 1 VIEW COMPARISON:  10/11/2015 FINDINGS: Cardiomegaly. Left Central line remains in place, unchanged. Mild bibasilar opacities, likely atelectasis. No visible effusions or acute bony abnormality. IMPRESSION: Decreasing lung volumes with increasing bibasilar atelectasis. Electronically Signed   By:  Charlett Nose M.D.   On: 10/13/2015 09:50   Assessment/Plan:  Oisin is a pleasant 70 year old gentleman who presents with altered mental status and difficulty with rehabilitation after a laminectomy. There is wife and daughter are at the bedside and provide much of his history. They are describing features of parkinsonism as well as mild cognitive impairment. Edrik also appears to be experiencing some mild auditory and visual hallucinations. 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 The differential diagnosis would include other neurodegenerative conditions such as Parkinson's disease with superimposed cognitive impairment as well as the multiple system atrophies among others.  This can be further and more accurately addressed when the patient is 4-6 weeks after surgery. This would be best done in an outpatient neurological setting where dedicated neuropsychiatric testing can also be performed. It is very likely that the cognitive impairment which is baseline is interfering with his ability to concentrate and focus after his surgery. It is also likely that the parkinsonism is a significant factor impeding his progress with physical and occupational therapy.  Plan:  1. The presentation appears highly suggestive of Lewy body dementia. This can be further and better evaluated in a more controlled outpatient neurological setting after Keysean has been discharged from the hospital.  2. The MRI reveals a questionable area of restricted diffusion in the left frontoparietal region. We will discuss this with the stroke team to see if they have any further suggestions. It does not appear that this is a factor in triggering to his current neurological  difficulties.  3.  Khoi was noted to have sonorous snoring as well as what appeared to be apneic spells while he was being evaluated. The presentation appears suggestive of sleep apnea. This is best evaluated with an outpatient sleep study.  4. Seyed may also be experiencing features of REM sleep behavior disorder area this can also be best evaluated in an outpatient setting.  Thank you for consulting the neurology service to assist in the care of your patient!   James A. Hilda Blades, M.D. Neurohospitalist Phone: [phone]  10/14/2015, 8:53 AM     Garrus's case has been discussed with the stroke team. They recommended further evaluation of the possible ischemic lesion. The lipid panel has already been completed and was reviewed and was unremarkable. It is suspected that hypertension might have been a factor contributing to some of the MRI findings. That is already being addressed by the medicine team.  Additional evaluations:  1. Carotid Dopplers 2. Echocardiogram 3. Begin aspirin 350 mg per day.  It is not felt that the MRI findings are contributing to [facility] current presentation. Nyxon has already been shown to be safe with regards to his swallowing.  James A. Harold Hedge.D.

## 2015-10-14 NOTE — Clinical Social Work Note (Signed)
Patient being considered for CIR/IP REHAB. Patient has bed at [facility] once stable for d/c. Facility notified.   CSW will continue to follow patient and family for continued support and to facilitate pt's d/c needs once stable.   Derenda Fennel, MSW, LCSWA [phone] 10/14/2015 11:25 AM

## 2015-10-14 NOTE — Progress Notes (Signed)
Occupational Therapy Treatment Patient Details Name: Danny Patterson MRN: [id number] DOB: [birth year] Today's Date: 10/14/2015    History of present illness Patient is a 70 yo male s/p Lumbar three-four, Lumbar four-five, Lumbar five-Sacrum one posterior lumbar interbody fusion with Laminotomy at Lumbar Two-Three complicated by post op dural tear/csf leak and then required intubation for respiratory insufficiency, hypoxia, shock, extubated 7/20.   OT comments  Pt requesting to get back to bed following sitting up in chair for ~30 minutes. Politely dlining functional mobility or ADL at this time due to back pain. Pt required mod assist +2 for safety for stand pivot transfer from chair to EOB and overall max assist for bed mobility. Encouraged OOB later this afternoon with assist of nursing staff; pt agreeable. Reviewed back precautions during functional activity. D/c plan remains appropriate. Will continue to follow acutely.   Follow Up Recommendations  CIR;Supervision/Assistance - 24 hour    Equipment Recommendations  Other (comment) (TBD at next venue)    Recommendations for Other Services      Precautions / Restrictions Precautions Precautions: Back Precaution Comments: Reviewed back precautions with pt in regards to functional activities Required Braces or Orthoses: Spinal Brace Spinal Brace: Lumbar corset;Applied in supine position Restrictions Weight Bearing Restrictions: No       Mobility Bed Mobility Overal bed mobility: Needs Assistance Bed Mobility: Rolling;Sit to Sidelying Rolling: Mod assist      Sit to sidelying: Max assist General bed mobility comments: Cues for log roll technique. Total assist +2 to scoot up in bed.  Transfers Overall transfer level: Needs assistance Equipment used: Rolling walker (2 wheeled) Transfers: Sit to/from [employer] Sit to Stand: Mod assist Stand pivot transfers: Min assist;+2 safety/equipment       General  transfer comment: VCs throughout stand pivot for safety and sequencing.    Balance Overall balance assessment: Needs assistance Sitting-balance support: Feet supported;Bilateral upper extremity supported Sitting balance-Leahy Scale: Poor Sitting balance - Comments: Pt able to support with UE on EOB while donning gown.   Standing balance support: Bilateral upper extremity supported Standing balance-Leahy Scale: Poor Standing balance comment: RW for support                   ADL Overall ADL's : Needs assistance/impaired                 Upper Body Dressing : Maximal assistance;Sitting Upper Body Dressing Details (indicate cue type and reason): to doff back brace. Min assist to [facility] hospital gown     Toilet Transfer: Moderate assistance;+2 for safety/equipment;Stand-pivot;RW;Cueing for safety;Cueing for sequencing Toilet Transfer Details (indicate cue type and reason): VCs for hand placement and sequencing thorughout stand pivot. Toilet transfer simulated by stand pivot from chair to EOB.         Functional mobility during ADLs: Moderate assistance;+2 for safety/equipment;Rolling walker General ADL Comments: Encouraged pt to be OOB at least one more time this afternoon with assist of nursing staff. Further discussed SNF vs CIR with family. Pt mod assist to boost up from chair and min assist with verbal cues to stand pivot.      Vision                     Perception     Praxis      Cognition   Behavior During Therapy: [facility] for tasks assessed/performed Overall Cognitive Status: Impaired/Different from baseline Area of Impairment: Safety/judgement;Problem solving;Awareness;Attention   Current Attention Level: Sustained Memory: Decreased  recall of precautions;Decreased short-term memory  Following Commands: Follows one step commands consistently Safety/Judgement: Decreased awareness of safety;Decreased awareness of deficits Awareness: Emergent Problem  Solving: Requires verbal cues General Comments: Pt oriented to time but not to location or situation.     Extremity/Trunk Assessment               Exercises     Shoulder Instructions       General Comments      Pertinent Vitals/ Pain       Pain Assessment: Faces Pain Score: 7  Faces Pain Scale: Hurts even more Pain Location: back Pain Descriptors / Indicators: Discomfort;Sore;Grimacing Pain Intervention(s): Monitored during session;Repositioned  Home Living                                          Prior Functioning/Environment              Frequency Min 3X/week     Progress Toward Goals  OT Goals(current goals can now be found in the care plan section)  Progress towards OT goals: Progressing toward goals  Acute Rehab OT Goals Patient Stated Goal: decrease pain OT Goal Formulation: With patient  Plan Discharge plan remains appropriate    Co-evaluation                 End of Session Equipment Utilized During Treatment: Rolling walker;Back brace   Activity Tolerance Patient limited by pain   Patient Left in bed;with call bell/phone within reach;with bed alarm set;with nursing/sitter in room;with family/visitor present;with SCD's reapplied   Nurse Communication Mobility status        Time: 1212-1227 OT Time Calculation (min): 15 min  Charges: OT General Charges $OT Visit: 1 Procedure OT Treatments $Self Care/Home Management : 8-22 mins  Gaye Alken M.S., OTR/L Pager: [phone]  10/14/2015, 12:36 PM

## 2015-10-15 ENCOUNTER — Inpatient Hospital Stay (HOSPITAL_COMMUNITY): Payer: PPO

## 2015-10-15 ENCOUNTER — Encounter (HOSPITAL_COMMUNITY): Payer: Self-pay | Admitting: *Deleted

## 2015-10-15 ENCOUNTER — Inpatient Hospital Stay (HOSPITAL_COMMUNITY)
Admission: RE | Admit: 2015-10-15 | Discharge: 2015-10-23 | DRG: 949 | Disposition: A | Discharge status: Home Health | Payer: PPO | Source: Intra-hospital | Attending: Physical Medicine & Rehabilitation | Admitting: Physical Medicine & Rehabilitation

## 2015-10-15 DIAGNOSIS — Z981 Arthrodesis status: Secondary | ICD-10-CM | POA: Diagnosis not present

## 2015-10-15 DIAGNOSIS — R410 Disorientation, unspecified: Secondary | ICD-10-CM | POA: Diagnosis not present

## 2015-10-15 DIAGNOSIS — F0280 Dementia in other diseases classified elsewhere without behavioral disturbance: Secondary | ICD-10-CM

## 2015-10-15 DIAGNOSIS — M4726 Other spondylosis with radiculopathy, lumbar region: Secondary | ICD-10-CM | POA: Diagnosis not present

## 2015-10-15 DIAGNOSIS — R609 Edema, unspecified: Secondary | ICD-10-CM | POA: Diagnosis not present

## 2015-10-15 DIAGNOSIS — R9389 Abnormal findings on diagnostic imaging of other specified body structures: Secondary | ICD-10-CM

## 2015-10-15 DIAGNOSIS — E46 Unspecified protein-calorie malnutrition: Secondary | ICD-10-CM | POA: Diagnosis present

## 2015-10-15 DIAGNOSIS — I10 Essential (primary) hypertension: Secondary | ICD-10-CM | POA: Diagnosis present

## 2015-10-15 DIAGNOSIS — G9340 Encephalopathy, unspecified: Secondary | ICD-10-CM | POA: Diagnosis not present

## 2015-10-15 DIAGNOSIS — E083513 Diabetes mellitus due to underlying condition with proliferative diabetic retinopathy with macular edema, bilateral: Secondary | ICD-10-CM | POA: Diagnosis not present

## 2015-10-15 DIAGNOSIS — Z794 Long term (current) use of insulin: Secondary | ICD-10-CM

## 2015-10-15 DIAGNOSIS — D62 Acute posthemorrhagic anemia: Secondary | ICD-10-CM

## 2015-10-15 DIAGNOSIS — G3183 Dementia with Lewy bodies: Secondary | ICD-10-CM | POA: Diagnosis present

## 2015-10-15 DIAGNOSIS — M5136 Other intervertebral disc degeneration, lumbar region: Secondary | ICD-10-CM

## 2015-10-15 DIAGNOSIS — G4733 Obstructive sleep apnea (adult) (pediatric): Secondary | ICD-10-CM | POA: Diagnosis present

## 2015-10-15 DIAGNOSIS — F4321 Adjustment disorder with depressed mood: Secondary | ICD-10-CM

## 2015-10-15 DIAGNOSIS — M109 Gout, unspecified: Secondary | ICD-10-CM | POA: Diagnosis present

## 2015-10-15 DIAGNOSIS — E119 Type 2 diabetes mellitus without complications: Secondary | ICD-10-CM | POA: Diagnosis not present

## 2015-10-15 DIAGNOSIS — J690 Pneumonitis due to inhalation of food and vomit: Secondary | ICD-10-CM

## 2015-10-15 DIAGNOSIS — R339 Retention of urine, unspecified: Secondary | ICD-10-CM | POA: Diagnosis present

## 2015-10-15 DIAGNOSIS — Z4889 Encounter for other specified surgical aftercare: Principal | ICD-10-CM

## 2015-10-15 DIAGNOSIS — R269 Unspecified abnormalities of gait and mobility: Secondary | ICD-10-CM | POA: Diagnosis not present

## 2015-10-15 DIAGNOSIS — R938 Abnormal findings on diagnostic imaging of other specified body structures: Secondary | ICD-10-CM

## 2015-10-15 DIAGNOSIS — R4781 Slurred speech: Secondary | ICD-10-CM

## 2015-10-15 DIAGNOSIS — R4182 Altered mental status, unspecified: Secondary | ICD-10-CM

## 2015-10-15 DIAGNOSIS — R195 Other fecal abnormalities: Secondary | ICD-10-CM

## 2015-10-15 DIAGNOSIS — M1000 Idiopathic gout, unspecified site: Secondary | ICD-10-CM

## 2015-10-15 DIAGNOSIS — J189 Pneumonia, unspecified organism: Secondary | ICD-10-CM

## 2015-10-15 DIAGNOSIS — Z6831 Body mass index (BMI) 31.0-31.9, adult: Secondary | ICD-10-CM

## 2015-10-15 DIAGNOSIS — E1121 Type 2 diabetes mellitus with diabetic nephropathy: Secondary | ICD-10-CM

## 2015-10-15 DIAGNOSIS — J9601 Acute respiratory failure with hypoxia: Secondary | ICD-10-CM | POA: Diagnosis not present

## 2015-10-15 DIAGNOSIS — E1169 Type 2 diabetes mellitus with other specified complication: Secondary | ICD-10-CM

## 2015-10-15 DIAGNOSIS — Z419 Encounter for procedure for purposes other than remedying health state, unspecified: Secondary | ICD-10-CM

## 2015-10-15 LAB — RENAL FUNCTION PANEL
Albumin: 2.5 g/dL — ABNORMAL LOW (ref 3.5–5.0)
Anion gap: 5 (ref 5–15)
BUN: 12 mg/dL (ref 6–20)
CHLORIDE: 106 mmol/L (ref 101–111)
CO2: 28 mmol/L (ref 22–32)
Calcium: 8.9 mg/dL (ref 8.9–10.3)
Creatinine, Ser: 0.86 mg/dL (ref 0.61–1.24)
GFR calc non Af Amer: >60 mL/min (ref >60)
Glucose, Bld: 113 mg/dL — ABNORMAL HIGH (ref 65–99)
Phosphorus: 3.9 mg/dL (ref 2.5–4.6)
Potassium: 3.8 mmol/L (ref 3.5–5.1)
Sodium: 139 mmol/L (ref 135–145)

## 2015-10-15 LAB — CBC WITH DIFFERENTIAL/PLATELET
BASOS ABS: 0 10*3/uL (ref 0.0–0.1)
Basophils Relative: 0 %
EOS ABS: 0.3 10*3/uL (ref 0.0–0.7)
EOS PCT: 4 %
HCT: 29.2 % — ABNORMAL LOW (ref 39.0–52.0)
Hemoglobin: 9.4 g/dL — ABNORMAL LOW (ref 13.0–17.0)
LYMPHS PCT: 23 %
Lymphs Abs: 1.9 10*3/uL (ref 0.7–4.0)
MCH: 29.3 pg (ref 26.0–34.0)
MCHC: 32.2 g/dL (ref 30.0–36.0)
MCV: 91 fL (ref 78.0–100.0)
Monocytes Absolute: 0.7 10*3/uL (ref 0.1–1.0)
Monocytes Relative: 9 %
Neutro Abs: 5.4 10*3/uL (ref 1.7–7.7)
Neutrophils Relative %: 64 %
PLATELETS: 476 10*3/uL — AB (ref 150–400)
RBC: 3.21 MIL/uL — AB (ref 4.22–5.81)
RDW: 14 % (ref 11.5–15.5)
WBC: 8.4 10*3/uL (ref 4.0–10.5)

## 2015-10-15 LAB — GLUCOSE, CAPILLARY
GLUCOSE-CAPILLARY: 119 mg/dL — AB (ref 65–99)
GLUCOSE-CAPILLARY: 194 mg/dL — AB (ref 65–99)
GLUCOSE-CAPILLARY: 140 mg/dL — AB (ref 65–99)
Glucose-Capillary: 109 mg/dL — ABNORMAL HIGH (ref 65–99)

## 2015-10-15 LAB — MAGNESIUM: MAGNESIUM: 1.9 mg/dL (ref 1.7–2.4)

## 2015-10-15 MED ORDER — PROCHLORPERAZINE EDISYLATE 5 MG/ML IJ SOLN: 5.0000 mg | Freq: Four times a day (QID) | INTRAMUSCULAR | Status: DC | PRN

## 2015-10-15 MED ORDER — FLEET ENEMA 7-19 GM/118ML RE ENEM
1.0000 | ENEMA | Freq: Once | RECTAL | Status: DC | PRN
Start: 2015-10-15 — End: 2015-10-23

## 2015-10-15 MED ORDER — SODIUM CHLORIDE 0.9% FLUSH: 10.0000 mL | INTRAVENOUS | Status: DC | PRN

## 2015-10-15 MED ORDER — ASPIRIN 325 MG PO TABS: 325.0000 mg | ORAL_TABLET | Freq: Every day | ORAL | Status: DC

## 2015-10-15 MED ORDER — AMLODIPINE BESYLATE 5 MG PO TABS
5.0000 mg | ORAL_TABLET | Freq: Every day | ORAL | Status: DC
  Administered 2015-10-16 – 2015-10-23 (×8): 5 mg via ORAL
  Filled 2015-10-15 (×9): qty 1

## 2015-10-15 MED ORDER — ASPIRIN 325 MG PO TABS
325.0000 mg | ORAL_TABLET | Freq: Every day | ORAL | Status: DC
  Administered 2015-10-16 – 2015-10-23 (×8): 325 mg via ORAL
  Filled 2015-10-15 (×8): qty 1

## 2015-10-15 MED ORDER — TAMSULOSIN HCL 0.4 MG PO CAPS
0.4000 mg | ORAL_CAPSULE | Freq: Every day | ORAL | Status: DC
  Administered 2015-10-16 – 2015-10-23 (×8): 0.4 mg via ORAL
  Filled 2015-10-15 (×8): qty 1

## 2015-10-15 MED ORDER — ENSURE ENLIVE PO LIQD
237.0000 mL | Freq: Two times a day (BID) | ORAL | Status: DC
  Administered 2015-10-21 – 2015-10-22 (×2): 237 mL via ORAL

## 2015-10-15 MED ORDER — ALUM & MAG HYDROXIDE-SIMETH 200-200-20 MG/5ML PO SUSP: 30.0000 mL | ORAL | Status: DC | PRN

## 2015-10-15 MED ORDER — PROCHLORPERAZINE 25 MG RE SUPP: 12.5000 mg | Freq: Four times a day (QID) | RECTAL | Status: DC | PRN

## 2015-10-15 MED ORDER — SIMVASTATIN 20 MG PO TABS: 40.0000 mg | ORAL_TABLET | Freq: Every day | ORAL | Status: DC

## 2015-10-15 MED ORDER — BISACODYL 10 MG RE SUPP: 10.0000 mg | Freq: Every day | RECTAL | Status: DC | PRN

## 2015-10-15 MED ORDER — INSULIN ASPART 100 UNIT/ML ~~LOC~~ SOLN: 0.0000 [IU] | Freq: Every day | SUBCUTANEOUS | Status: DC

## 2015-10-15 MED ORDER — SODIUM CHLORIDE 0.9% FLUSH: 10.0000 mL | Freq: Two times a day (BID) | INTRAVENOUS | Status: DC

## 2015-10-15 MED ORDER — SENNOSIDES-DOCUSATE SODIUM 8.6-50 MG PO TABS: 1.0000 | ORAL_TABLET | Freq: Every evening | ORAL | Status: DC | PRN

## 2015-10-15 MED ORDER — POTASSIUM CHLORIDE CRYS ER 20 MEQ PO TBCR
20.0000 meq | EXTENDED_RELEASE_TABLET | Freq: Two times a day (BID) | ORAL | Status: DC
  Administered 2015-10-16 – 2015-10-23 (×15): 20 meq via ORAL
  Filled 2015-10-15 (×15): qty 1

## 2015-10-15 MED ORDER — FUROSEMIDE 20 MG PO TABS
20.0000 mg | ORAL_TABLET | Freq: Every day | ORAL | Status: DC
  Administered 2015-10-16 – 2015-10-20 (×5): 20 mg via ORAL
  Filled 2015-10-15 (×5): qty 1

## 2015-10-15 MED ORDER — ACETAMINOPHEN 325 MG PO TABS
325.0000 mg | ORAL_TABLET | ORAL | Status: DC | PRN
  Administered 2015-10-16: 650 mg via ORAL
  Administered 2015-10-16: 325 mg via ORAL
  Administered 2015-10-16 – 2015-10-18 (×5): 650 mg via ORAL
  Administered 2015-10-19: 325 mg via ORAL
  Administered 2015-10-19: 650 mg via ORAL
  Administered 2015-10-19: 325 mg via ORAL
  Administered 2015-10-20 – 2015-10-21 (×3): 650 mg via ORAL
  Filled 2015-10-15 (×13): qty 2

## 2015-10-15 MED ORDER — INSULIN ASPART 100 UNIT/ML ~~LOC~~ SOLN
0.0000 [IU] | Freq: Three times a day (TID) | SUBCUTANEOUS | Status: DC
  Administered 2015-10-18 – 2015-10-21 (×4): 1 [IU] via SUBCUTANEOUS

## 2015-10-15 MED ORDER — METHOCARBAMOL 500 MG PO TABS
500.0000 mg | ORAL_TABLET | Freq: Four times a day (QID) | ORAL | Status: DC | PRN
  Administered 2015-10-17 – 2015-10-21 (×10): 500 mg via ORAL
  Filled 2015-10-15 (×11): qty 1

## 2015-10-15 MED ORDER — LIDOCAINE HCL 2 % EX GEL
CUTANEOUS | Status: DC | PRN
  Filled 2015-10-15: qty 5

## 2015-10-15 MED ORDER — GUAIFENESIN-DM 100-10 MG/5ML PO SYRP
5.0000 mL | ORAL_SOLUTION | Freq: Four times a day (QID) | ORAL | Status: DC | PRN
Start: 2015-10-15 — End: 2015-10-23

## 2015-10-15 MED ORDER — PROCHLORPERAZINE MALEATE 5 MG PO TABS: 5.0000 mg | ORAL_TABLET | Freq: Four times a day (QID) | ORAL | Status: DC | PRN

## 2015-10-15 MED ORDER — DIPHENHYDRAMINE HCL 12.5 MG/5ML PO ELIX: 12.5000 mg | ORAL_SOLUTION | Freq: Four times a day (QID) | ORAL | Status: DC | PRN

## 2015-10-15 MED ORDER — TRAZODONE HCL 50 MG PO TABS
25.0000 mg | ORAL_TABLET | Freq: Every evening | ORAL | Status: DC | PRN
  Administered 2015-10-21: 50 mg via ORAL
  Filled 2015-10-15: qty 1

## 2015-10-15 NOTE — Progress Notes (Signed)
VASCULAR LAB PRELIMINARY  PRELIMINARY  PRELIMINARY  PRELIMINARY  Carotid duplex has been completed.     Bilateral:  1-39% ICA stenosis.  Vertebral artery flow is antegrade.    Note patient had central line on left side of neck so exam was limited.  Jenetta Loges, RVT, RDMS 10/15/2015, 2:13 PM

## 2015-10-15 NOTE — Progress Notes (Signed)
[facility] PROGRESS NOTE  Danny Patterson ZOX:[id number] DOB: May 06, 1945 DOA: 10/01/2015 PCP: Fidel Levy, MD  Today's Plan: Magnesium IV, PT/OT, try to d/c sitter, stroke team might see pt Try to d/c tomorrow  Overview: 70yo male with hx HTN, DM, gout, chronic lumbar back pain with radiation to the R foot.  He underwent lumbar fusion 7/12 by Dr. Jeral Fruit.  He was extubated post op but is to remain flat and in the afternoon post op he developed some hypoxia and PCCM consulted. Hypoxia thought to be due to OSA, sleep study as OP. Pt developed AMS, thought to be due to pain meds. Pt then found to have HCAP, 7 days of zosyn. Was intubated and later extubated. Briefly with shock and on levophed. TEED done that ruled out cardiac issues. 7/20 pt transfused 1u PRBCS. 7/22 transfused 2u prbcs. Pt had continued delirium. Meds de-escalated further. MRI shows no likely cause for MS change. Speech consulted for family concern of slurred speech. Neg workup.  Neuro eval patient. Recs - Carotid, repeat ECHO and ASA advised. Stroke team will not see patient.   D/c tomorrow.  Assessment/Plan:  1. Acute Hypoxic Respiratory Failure: Status post extubation Resolved.  2. Delirium->Lewy body dementia and metabolic encephalopathy from sedatives and infection: Multifactorial. Recommend continuing to avoid any sedating medications.  Neg UA and CXR. MRI with possible artifact versus true stroke. Neuro complete, state pt has dx of dementia. ECHO & Carotids did not show any significant abnormalities 3. Post-operative Pain: Recommend Tylenol for relief of pain. Discharged to [facility]  4. Aspiration Pneumonia: Zosyn 7 day course  Completed 7/22.  5. Hypokalemia: Resolved 6. Hypernatremia , resolved:   7. Hypertension: Restarting Norvasc at lower dose ( ) & Lasix daily. 8. Hyperlipidemia: Continuing Zocor. 9. Diabetes Mellitus Type 2:Started back on Metformin at discharge.  10. Urinary Retention: Cont Flomax.  Continuing to hold on replacing Foley catheter, starting urinal. 11. Anemia: S/P 1u PRBC on 7/20. Improved minimally. Repeat 2u on 7/22. Good response. Neg occult stool. 12. Shock: Resolved. Likely medication induced. 13. Prophylaxis: SCDs. 14. Diet: Clear liquid diet. Advance as tolerated. 15. Slurred speech?: Resolved  concern for cognitive decline beyond delirium 16. Low Mg: Normalized replaced IV and recheck 17. Snoring - OP sleep study on d/c 18. LE muscle spasm: fix electrolytes, oral hydration, mobilization 19. Disposition:  SNF today  Consultants:  Neuro  Procedures:  See EMR  Antibiotics:  Zosyn day 7 of 7 (indicate start date, and stop date if known)  HPI/Subjective: Pt states he is well. Complains of leg cramps. No fever or SOB.  Objective: Vitals:   10/15/15 1344 10/15/15 1658  BP: (!) 146/76 129/74  Pulse: 89 97  Resp: 20 18  Temp: 98.5 F (36.9 C) 97.6 F (36.4 C)    Intake/Output Summary (Last 24 hours) at 10/15/15 1733 Last data filed at 10/15/15 1633  Gross per 24 hour  Intake               30 ml  Output                0 ml  Net               30 ml   Filed Weights   10/08/15 0401 10/09/15 0200 10/10/15 0500  Weight: 104.7 kg (230 lb 13.2 oz) 105.7 kg (233 lb 0.4 oz) 105 kg (231 lb 7.7 oz)    Exam:  General:  No diaphoresis, anxious, no acute distress Cardiovascular:  Regular rate and rhythm no murmurs rubs or gallops Respiratory: CTAB nl WOB Abdomen: Nondistended bowel sounds normal nontender palpation  Musculoskeletal: Moving all extremities, no deformity, 5 out of 5 strength, thigh muscle spasm on exam  Skin: CDI dressing on R back   Data Reviewed: Basic Metabolic Panel:  Recent Labs Lab 10/11/15 0415 10/11/15 0500 10/12/15 0425 10/13/15 0635 10/14/15 0446 10/15/15 0531  NA  --  143 141 143 141 139  K  --  3.2* 3.2* 3.3* 3.2* 3.8  CL  --  111 108 106 107 106  CO2  --  25 25 26 27 28   GLUCOSE  --  110* 103* 124* 114* 113*   BUN  --  14 13 10 7 12   CREATININE  --  0.80 0.79 0.87 0.70 0.86  CALCIUM  --  8.9 8.9 9.2 8.9 8.9  MG 1.8  --  1.6* 1.9 1.6* 1.9  PHOS  --  3.3 2.9 2.8 2.8 3.9   Liver Function Tests:  Recent Labs Lab 10/11/15 0500 10/12/15 0425 10/13/15 0635 10/14/15 0446 10/15/15 0531  ALBUMIN 2.2* 2.4* 2.7* 2.5* 2.5*   No results for input(s): LIPASE, AMYLASE in the last 168 hours. No results for input(s): AMMONIA in the last 168 hours. CBC:  Recent Labs Lab 10/11/15 0415 10/12/15 0425 10/13/15 0635 10/14/15 0446 10/15/15 0531  WBC 7.0 8.3 9.2 7.4 8.4  NEUTROABS 4.6 5.8 6.3 4.9 5.4  HGB 7.0* 9.4* 10.4* 9.4* 9.4*  HCT 20.7* 28.2* 31.4* 28.9* 29.2*  MCV 89.2 88.7 89.0 89.8 91.0  PLT 300 354 470* 439* 476*   Cardiac Enzymes:  Recent Labs Lab 10/09/15 0953  TROPONINI 0.04*   BNP (last 3 results) No results for input(s): BNP in the last 8760 hours.  ProBNP (last 3 results) No results for input(s): PROBNP in the last 8760 hours.  CBG:  Recent Labs Lab 10/14/15 1627 10/14/15 2245 10/15/15 0633 10/15/15 1125 10/15/15 1655  GLUCAP 120* 160* 109* 119* 194*    Recent Results (from the past 240 hour(s))  Culture, blood (Routine X 2) w Reflex to ID Panel     Status: None   Collection Time: 10/05/15  6:28 PM  Result Value Ref Range Status   Specimen Description BLOOD RIGHT ARM  Final   Special Requests BOTTLES DRAWN AEROBIC AND ANAEROBIC 5CC  Final   Culture NO GROWTH 5 DAYS  Final   Report Status 10/10/2015 FINAL  Final  Culture, respiratory (NON-Expectorated)     Status: None   Collection Time: 10/05/15  7:01 PM  Result Value Ref Range Status   Specimen Description ENDOTRACHEAL  Final   Special Requests NONE  Final   Gram Stain   Final    ABUNDANT WBC PRESENT, PREDOMINANTLY PMN FEW SQUAMOUS EPITHELIAL CELLS PRESENT ABUNDANT GRAM POSITIVE COCCI IN CHAINS IN CLUSTERS FEW GRAM NEGATIVE RODS RARE GRAM NEGATIVE COCCI IN PAIRS    Culture Consistent with normal  respiratory flora.  Final   Report Status 10/07/2015 FINAL  Final     Studies: No results found.  Scheduled Meds: . amLODipine  5 mg Oral Daily  . aspirin  325 mg Oral Daily  . feeding supplement (ENSURE ENLIVE)  237 mL Oral BID BM  . furosemide  20 mg Oral Daily  . insulin aspart  0-5 Units Subcutaneous QHS  . insulin aspart  0-9 Units Subcutaneous TID WC  . potassium chloride  40 mEq Oral Daily  . simvastatin  40 mg Oral Daily  .  sodium chloride flush  10-40 mL Intracatheter Q12H  . tamsulosin  0.4 mg Oral Daily   Continuous Infusions:   Principal Problem:   Acute respiratory failure (HCC) Active Problems:   Hypertension   Diabetes (HCC)   Gout   Hyperlipidemia   Degenerative disc disease, lumbar   Acute encephalopathy   Hypokalemia   Anemia   S/P lumbar fusion   Altered mental status   Abnormal MRI   Slurred speech   Surgery, elective   Loose stools   OSA (obstructive sleep apnea)   Acute blood loss anemia   Pneumonia   Urinary retention   Lewy body dementia    Time spent: 30    [facility]  [facility] Pager [phone]. If 7PM-7AM, please contact night-coverage at [email or web address], password [facility] 10/15/2015, 5:33 PM  LOS: 14 days

## 2015-10-15 NOTE — Progress Notes (Signed)
Ranelle Oyster, MD Physician Signed Physical Medicine and Rehabilitation  Consult Note Date of Service: 10/10/2015 11:11 AM  Related encounter: Admission (Current) from 10/01/2015 in [facility] 45[facility]     Expand All Collapse All        Physical Medicine and Rehabilitation Consult Reason for Consult: Degenerative disc disease lumbar spine with spondylolisthesis/radiculopathy Referring Physician: Dr. Jeral Fruit   HPI: Danny Patterson is a 70 y.o. right handed male with history of hypertension, diabetes mellitus. Patient lives with wife. Independent in driving prior to admission up until the last few months due to foot drop. 1 level home. Presented 10/01/2015 with low back pain radiating to the right lower extremity with foot drop that has progressed over the past 6 months. X-ray imaging showed degenerative disc disease lumbar spine L2-3, 3-4, 4-5 and L5-S1 with spondylolisthesis. Chronic radiculopathy. Underwent bilateral L3-4-5 laminectomy with lysis of adhesion. Discectomy at the level of L3-4, L5-S1. Pedicle screws from L3-S1. Bilateral facetectomy 10/02/2015 per Dr. Jeral Fruit. Back corset when out of bed. Hospital course pain management. Course complicated by respiratory insufficiency hypoxemia and shock requiring intubation. Followed by critical care pulmonary services and extubated 10/09/2015. Currently maintained on Zosyn for suspect pneumonia. Clear liquid diet and advance as tolerated. Physical therapy evaluation completed 10/10/2015 with recommendations of physical medicine rehabilitation consult.   Review of Systems  Constitutional: Negative for fever and chills.  HENT: Negative for hearing loss.   Eyes: Negative for blurred vision and double vision.  Respiratory: Negative for cough and shortness of breath.   Cardiovascular: Positive for leg swelling. Negative for chest pain and palpitations.  Gastrointestinal: Positive for constipation. Negative for nausea  and vomiting.  Genitourinary: Positive for urgency and frequency. Negative for dysuria.  Musculoskeletal: Positive for myalgias and back pain.  Skin: Negative for rash.  Neurological: Negative for seizures and headaches.  All other systems reviewed and are negative.      Past Medical History  Diagnosis Date  . Allergy   . Arthritis   . Hypertension   . Hyperlipidemia   . Rheumatic fever   . Kidney stone   . Diabetes mellitus without complication (HCC)   . Gout          Past Surgical History  Procedure Laterality Date  . Knot      removed from neck  . Back surgery    . Posterior lumbar fusion 4 level N/A 10/01/2015    Procedure: Lumbar three-four,  Lumbar four-five,  Lumbar five-Sacrum one posterior lumbar interbody fusion with Laminotomy at Lumbar Two-Three;  Surgeon: Hilda Lias, MD;  Location: [facility] NEURO ORS;  Service: Neurosurgery;  Laterality: N/A;        Family History  Problem Relation Age of Onset  . Parkinson's disease Father 74  . Kidney disease Maternal Grandmother    Social History:  reports that he has never smoked. He has never used smokeless tobacco. He reports that he drinks about 1.8 oz of alcohol per week. He reports that he does not use illicit drugs. Allergies:       Allergies  Allergen Reactions  . Ace Inhibitors Other (See Comments)    Angioedema.         Medications Prior to Admission  Medication Sig Dispense Refill  . amLODipine (NORVASC) 10 MG tablet Take 10 mg by mouth daily.    Marland Kitchen labetalol (NORMODYNE) 200 MG tablet Take 200 mg by mouth daily.     . magnesium gluconate (MAGONATE) 500 MG  tablet Take 500 mg by mouth daily.    . metFORMIN (GLUCOPHAGE) 500 MG tablet Take 1 tablet each AM with breakfast. (Patient taking differently: Take 500 mg by mouth daily with breakfast. Take 1 tablet each AM with breakfast.) 90 tablet 3  . naproxen sodium (ANAPROX) 220 MG tablet Take 220 mg by mouth 2 (two) times daily as needed.      . colchicine 0.6 MG tablet Take 0.6 mg by mouth as needed (3 on day one of flare then one daily after.).    Marland Kitchen furosemide (LASIX) 20 MG tablet Take 20 mg by mouth daily.    . simvastatin (ZOCOR) 40 MG tablet Take 40 mg by mouth daily.       Home: Home Living Family/patient expects to be discharged to:: Inpatient rehab Living Arrangements: Spouse/significant other Available Help at Discharge: Family  Functional History: Prior Function Level of Independence: Independent Comments: was driving despite pain in right foot per chart review Functional Status:  Mobility: Bed Mobility Overal bed mobility: Needs Assistance, +2 for physical assistance Bed Mobility: Rolling, Sidelying to Sit Rolling: Max assist, +2 for physical assistance Sidelying to sit: Total assist, +2 for physical assistance General bed mobility comments: Max assist for rolling in bed with UE reaching for rail. Total assist to elevate trunk to upright and bring LEs off the EOB. increased pain noted with transitional movements Transfers Overall transfer level: Needs assistance Equipment used:  (2 person face to face with gait belt and chuck pad) Transfers: Sit to/from Stand, Stand Pivot Transfers Sit to Stand: Max assist, +2 physical assistance Stand pivot transfers: Max assist, +2 physical assistance General transfer comment: Very heavy 2 person assist with significant reliance on chuck pad and gait belt, increased time, minimal ability to shuffle step to chair. Ambulation/Gait General Gait Details: unable to perform    ADL:    Cognition: Cognition Overall Cognitive Status: Impaired/Different from baseline Orientation Level: Disoriented to place, Disoriented to time, Disoriented to situation, Oriented to person Cognition Arousal/Alertness: Awake/alert Behavior During Therapy: WFL for tasks assessed/performed Overall Cognitive Status: Impaired/Different from baseline Area of Impairment: Attention,  Memory, Following commands, Safety/judgement, Awareness, Problem solving Current Attention Level: Focused (easily distracted by environment) Memory: Decreased recall of precautions, Decreased short-term memory Following Commands: Follows one step commands consistently, Follows one step commands with increased time Safety/Judgement: Decreased awareness of safety, Decreased awareness of deficits Awareness: Intellectual Problem Solving: Slow processing, Decreased initiation, Requires verbal cues, Requires tactile cues  Blood pressure 99/83, pulse 72, temperature 98.9 F (37.2 C), temperature source Axillary, resp. rate 23, height 5\' 10"  (1.778 m), weight 105 kg (231 lb 7.7 oz), SpO2 96 %. Physical Exam  Vitals reviewed. HENT:  Head: Normocephalic.  Neck: Normal range of motion. Neck supple. No thyromegaly present.  Cardiovascular: Normal rate and regular rhythm.   Respiratory:  Decreased breath sounds at the bases but clear to auscultation  GI: Soft. Bowel sounds are normal. He exhibits no distension.  Musculoskeletal:  Redness, warmth medial right ankle--1+ edema also  Neurological:  Lethargic but arousable. He does follow commands. Provides name age in place. Moves all 4 limb but right lower extremity appears more limited.  Skin:  Back incision is dressed  Psychiatric:  Confused. disoriented    Lab Results Last 24 Hours       Results for orders placed or performed during the hospital encounter of 10/01/15 (from the past 24 hour(s))  Glucose, capillary     Status: Abnormal   Collection Time: 10/09/15  12:02 PM  Result Value Ref Range   Glucose-Capillary 114 (H) 65 - 99 mg/dL  Glucose, capillary     Status: Abnormal   Collection Time: 10/09/15  3:31 PM  Result Value Ref Range   Glucose-Capillary 116 (H) 65 - 99 mg/dL  Glucose, capillary     Status: Abnormal   Collection Time: 10/09/15  7:38 PM  Result Value Ref Range   Glucose-Capillary 108 (H) 65 - 99 mg/dL  Glucose,  capillary     Status: Abnormal   Collection Time: 10/09/15 11:39 PM  Result Value Ref Range   Glucose-Capillary 118 (H) 65 - 99 mg/dL  Glucose, capillary     Status: Abnormal   Collection Time: 10/10/15  3:51 AM  Result Value Ref Range   Glucose-Capillary 114 (H) 65 - 99 mg/dL  Basic metabolic panel     Status: Abnormal   Collection Time: 10/10/15  5:45 AM  Result Value Ref Range   Sodium 146 (H) 135 - 145 mmol/L   Potassium 3.2 (L) 3.5 - 5.1 mmol/L   Chloride 116 (H) 101 - 111 mmol/L   CO2 24 22 - 32 mmol/L   Glucose, Bld 116 (H) 65 - 99 mg/dL   BUN 19 6 - 20 mg/dL   Creatinine, Ser 1.61 0.61 - 1.24 mg/dL   Calcium 8.8 (L) 8.9 - 10.3 mg/dL   GFR calc non Af Amer >60 >60 mL/min   GFR calc Af Amer >60 >60 mL/min   Anion gap 6 5 - 15  CBC with Differential/Platelet     Status: Abnormal   Collection Time: 10/10/15  5:45 AM  Result Value Ref Range   WBC 7.0 4.0 - 10.5 K/uL   RBC 2.35 (L) 4.22 - 5.81 MIL/uL   Hemoglobin 7.1 (L) 13.0 - 17.0 g/dL   HCT 09.6 (L) 04.5 - 40.9 %   MCV 89.8 78.0 - 100.0 fL   MCH 30.2 26.0 - 34.0 pg   MCHC 33.6 30.0 - 36.0 g/dL   RDW 81.1 91.4 - 78.2 %   Platelets 269 150 - 400 K/uL   Neutrophils Relative % 65 %   Neutro Abs 4.5 1.7 - 7.7 K/uL   Lymphocytes Relative 22 %   Lymphs Abs 1.6 0.7 - 4.0 K/uL   Monocytes Relative 8 %   Monocytes Absolute 0.5 0.1 - 1.0 K/uL   Eosinophils Relative 5 %   Eosinophils Absolute 0.4 0.0 - 0.7 K/uL   Basophils Relative 0 %   Basophils Absolute 0.0 0.0 - 0.1 K/uL  Renal function panel     Status: Abnormal   Collection Time: 10/10/15  5:45 AM  Result Value Ref Range   Sodium 146 (H) 135 - 145 mmol/L   Potassium 3.3 (L) 3.5 - 5.1 mmol/L   Chloride 117 (H) 101 - 111 mmol/L   CO2 23 22 - 32 mmol/L   Glucose, Bld 116 (H) 65 - 99 mg/dL   BUN 19 6 - 20 mg/dL   Creatinine, Ser 9.56 0.61 - 1.24 mg/dL   Calcium 8.8 (L) 8.9 - 10.3 mg/dL   Phosphorus 2.9 2.5 - 4.6 mg/dL    Albumin 2.1 (L) 3.5 - 5.0 g/dL   GFR calc non Af Amer >60 >60 mL/min   GFR calc Af Amer >60 >60 mL/min   Anion gap 6 5 - 15  Magnesium     Status: None   Collection Time: 10/10/15  5:45 AM  Result Value Ref Range   Magnesium 1.8 1.7 -  2.4 mg/dL  Glucose, capillary     Status: Abnormal   Collection Time: 10/10/15  7:57 AM  Result Value Ref Range   Glucose-Capillary 108 (H) 65 - 99 mg/dL     Imaging Results (Last 48 hours)  No results found.    Assessment/Plan: Diagnosis: lumbar spondylolisthesis/radiculopathy with post-op respiratory failure/encephalopathy 1. Does the need for close, 24 hr/day medical supervision in concert with the patient's rehab needs make it unreasonable for this patient to be served in a less intensive setting? Yes 2. Co-Morbidities requiring supervision/potential complications: dm, gout right ankle/foot 3. Due to bladder management, bowel management, safety, skin/wound care, disease management, medication administration, pain management and patient education, does the patient require 24 hr/day rehab nursing? Yes 4. Does the patient require coordinated care of a physician, rehab nurse, PT (1-2 hrs/day, 5 days/week) and OT (1-2 hrs/day, 5 days/week) to address physical and functional deficits in the context of the above medical diagnosis(es)? Yes Addressing deficits in the following areas: balance, endurance, locomotion, strength, transferring, bowel/bladder control, bathing, dressing, feeding, grooming, toileting, cognition and psychosocial support 5. Can the patient actively participate in an intensive therapy program of at least 3 hrs of therapy per day at least 5 days per week? Yes and Potentially 6. The potential for patient to make measurable gains while on inpatient rehab is excellent 7. Anticipated functional outcomes upon discharge from inpatient rehab are modified independent  with PT, modified independent and supervision with OT, n/a with  SLP. 8. Estimated rehab length of stay to reach the above functional goals is: 10-15 days 9. Does the patient have adequate social supports and living environment to accommodate these discharge functional goals? Yes 10. Anticipated D/C setting: Home 11. Anticipated post D/C treatments: HH therapy and Outpatient therapy 12. Overall Rehab/Functional Prognosis: excellent  RECOMMENDATIONS: This patient's condition is appropriate for continued rehabilitative care in the following setting: CIR Patient has agreed to participate in recommended program. Yes Note that insurance prior authorization may be required for reimbursement for recommended care.  Comment: Rehab Admissions Coordinator to follow up. Spoke with daughter at [employer],  Ranelle Oyster, MD, [facility]     10/10/2015     Revision History              Routing History

## 2015-10-15 NOTE — H&P (Signed)
  Physical Medicine and Rehabilitation Admission H&P    CC: Weakness   HPI: Danny Patterson is an 70 y.o. male who was admitted to the hospital on 07/12 for bilateral L3- L5 laminectomy with diskectomy and interbody fusion by Dr. Botero.  Post op course complicated acute hypercarbic respiratory failure due to OSA and oversedation progressing to AMS, hypotension and acute respiratory failure requiring intubation from 07/16.  CTA chest negative for PE. He was treated for septic shock due to HCAP or aspiration PNA and had elevated troponin's due to demand ischemia.  TEE done showing normal EF 55-60% with no effusion and no evidence of elevated filling pressures. He tolerated extubation on 07/20 but continued to have agitation with confusion and slurred speech requiring sitters for safety.  Neurology consulted and MRI brain done 7/24 revealing possible 4 mm restricted diffusion left frontoparietal region question early or subacute infarct and evidence of small vessel disease. Neurology felt that patient with possible small ischemic change but not sufficient to explain patient's symptoms.    Cognitive evaluation showed moderate deficits with STM deficits and MoCA  Score 13/30.  Family did report patient having recent changes in personality, being more irritable and getting confused/loosing track  during occasionally during conversation, mild tremor as well as shuffling gait with frequent LOB. Dr. Armstrong/Neurology felt that patient's symptoms highly suggestive of Lewy body dementia and to be reevaluated 4-6 weeks on outpatient basis. 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   Has been transfused with multiple units PRBC for ABLA and H/H has been stable.  He has required foley due to issues with urinary retention. Foley discontinued with last PVR 850 cc. . Blood cultures/respiratory cultures negative.      Review of Systems  HENT: Negative for hearing loss.   Eyes: Negative for blurred vision and double vision.  Respiratory: Negative for cough and shortness of breath.   Cardiovascular: Negative for chest pain and leg swelling.  Gastrointestinal: Positive for diarrhea. Negative for heartburn and nausea.  Musculoskeletal: Positive for joint pain (bilateral knees) and myalgias.  Skin: Positive for itching and rash.  Neurological: Positive for sensory change (had numbness BLE--better since surger) and focal weakness (right foot drop). Negative for headaches.  Psychiatric/Behavioral: Positive for memory loss. The patient does not have insomnia.   All other systems reviewed and are negative.     Past Medical History:  Diagnosis Date  . Allergy   . Arthritis   . Diabetes mellitus without complication (HCC)   . Gout   . Hyperlipidemia   . Hypertension   . Kidney stone   . Rheumatic fever     Past Surgical History:  Procedure Laterality Date  . BACK SURGERY    . knot     removed from neck  . POSTERIOR LUMBAR FUSION 4 LEVEL N/A 10/01/2015   Procedure: Lumbar three-four,  Lumbar four-five,  Lumbar five-Sacrum one posterior lumbar interbody fusion with Laminotomy at Lumbar Two-Three;  Surgeon: Ernesto Botero, MD;  Location: [facility];  Service: Neurosurgery;  Laterality: N/A;   Family History  Problem Relation Age of Onset  . Parkinson's disease Father 68  . Kidney disease Maternal Grandmother     Social History:  Married. Was working part time prior to admission. Per reports that he has quit smoking 30 years ago--used to smoke 1-2 cigars for 10-12 years.   He has never used smokeless tobacco. He reports   that he drinks about alcohol about 2-3 beers per day.  He reports that he does not use drugs.     Allergies  Allergen Reactions  . Ace Inhibitors Other (See Comments)    Angioedema.    Medications Prior to Admission  Medication Sig Dispense Refill  . amLODipine  (NORVASC) 10 MG tablet Take 10 mg by mouth daily.    . labetalol (NORMODYNE) 200 MG tablet Take 200 mg by mouth daily.     . magnesium gluconate (MAGONATE) 500 MG tablet Take 500 mg by mouth daily.    . metFORMIN (GLUCOPHAGE) 500 MG tablet Take 1 tablet each AM with breakfast. (Patient taking differently: Take 500 mg by mouth daily with breakfast. Take 1 tablet each AM with breakfast.) 90 tablet 3  . naproxen sodium (ANAPROX) 220 MG tablet Take 220 mg by mouth 2 (two) times daily as needed.     . colchicine 0.6 MG tablet Take 0.6 mg by mouth as needed (3 on day one of flare then one daily after.).    . furosemide (LASIX) 20 MG tablet Take 20 mg by mouth daily.    . simvastatin (ZOCOR) 40 MG tablet Take 40 mg by mouth daily.       Home: Home Living Family/patient expects to be discharged to:: Inpatient rehab Living Arrangements: Spouse/significant other Available Help at Discharge: Available 24 hours/day, Family Type of Home: House  Lives With: Spouse   Functional History: Prior Function Level of Independence: Independent Comments: was driving despite pain in right foot per chart review  Functional Status:  Mobility: Bed Mobility Overal bed mobility: Needs Assistance Bed Mobility: Rolling, Sit to Sidelying Rolling: Mod assist Sidelying to sit: Max assist Sit to sidelying: Max assist General bed mobility comments: Cues for log roll technique. Total assist +2 to scoot up in bed. Transfers Overall transfer level: Needs assistance Equipment used: Rolling walker (2 wheeled) Transfers: Sit to/from Stand, Stand Pivot Transfers Sit to Stand: Mod assist Stand pivot transfers: Min assist, +2 safety/equipment General transfer comment: VCs throughout stand pivot for safety and sequencing. Ambulation/Gait Ambulation/Gait assistance: Mod assist, Min assist (Mod A to initiate ambulation, min A during ambulation) Ambulation Distance (Feet): 30 Feet (x 2 ) Assistive device: Rolling walker (2  wheeled) Gait Pattern/deviations: Step-to pattern, Decreased stride length, Shuffle General Gait Details: Pt increased awareness of walker placement, min A walker managment, min verbal cues for body placement in walker.+2 for chair follow.  Gait velocity: slow Gait velocity interpretation: Below normal speed for age/gender    ADL: ADL Overall ADL's : Needs assistance/impaired Eating/Feeding: Set up, Sitting Grooming: Minimal assistance, Sitting, Wash/dry face Grooming Details (indicate cue type and reason): Min assist for sitting balance Upper Body Bathing: Maximal assistance, Sitting Lower Body Bathing: Maximal assistance, +2 for physical assistance, Sitting/lateral leans Upper Body Dressing : Maximal assistance, Sitting Upper Body Dressing Details (indicate cue type and reason): to doff back brace. Min assist to doff hospital gown Lower Body Dressing: Maximal assistance Lower Body Dressing Details (indicate cue type and reason): to don socks at bedlevel Toilet Transfer: Moderate assistance, +2 for safety/equipment, Stand-pivot, RW, Cueing for safety, Cueing for sequencing Toilet Transfer Details (indicate cue type and reason): VCs for hand placement and sequencing thorughout stand pivot. Toilet transfer simulated by stand pivot from chair to EOB. Toileting- Clothing Manipulation and Hygiene: Total assistance Functional mobility during ADLs: Moderate assistance, +2 for safety/equipment, Rolling walker General ADL Comments: Encouraged pt to be OOB at least one more time this afternoon   with assist of nursing staff. Further discussed SNF vs CIR with family. Pt mod assist to boost up from chair and min assist with verbal cues to stand pivot.  Cognition: Cognition Overall Cognitive Status: Impaired/Different from baseline Arousal/Alertness: Awake/alert Orientation Level: Oriented X4 Attention: Focused, Sustained, Selective Focused Attention: Appears intact Sustained Attention:  Impaired Sustained Attention Impairment: Verbal basic Selective Attention: Impaired Selective Attention Impairment: Verbal basic Memory: Impaired Memory Impairment: Storage deficit, Retrieval deficit, Decreased recall of new information, Decreased short term memory Decreased Short Term Memory: Verbal basic Awareness: Impaired Problem Solving: Impaired Problem Solving Impairment: Verbal basic Executive Function: Reasoning, Organizing Reasoning: Impaired Reasoning Impairment: Verbal basic Organizing: Impaired Organizing Impairment: Verbal basic Behaviors: Poor frustration tolerance Safety/Judgment: Impaired Comments: 13/30 on MoCA Cognition Arousal/Alertness: Awake/alert Behavior During Therapy: WFL for tasks assessed/performed Overall Cognitive Status: Impaired/Different from baseline Area of Impairment: Safety/judgement, Problem solving, Awareness, Attention Current Attention Level: Sustained Memory: Decreased recall of precautions, Decreased short-term memory Following Commands: Follows one step commands consistently Safety/Judgement: Decreased awareness of safety, Decreased awareness of deficits Awareness: Emergent Problem Solving: Requires verbal cues General Comments: Pt oriented to time but not to location or situation.     Blood pressure 138/76, pulse 89, temperature 98.2 F (36.8 C), temperature source Oral, resp. rate 18, height 5' 10" (1.778 m), weight 105 kg (231 lb 7.7 oz), SpO2 100 %. Physical Exam  Nursing note and vitals reviewed. Constitutional: He is oriented to person, place, and time. He appears well-developed and well-nourished.  HENT:  Head: Normocephalic and atraumatic.  Mouth/Throat: Oropharynx is clear and moist.  Eyes: Conjunctivae are normal. Pupils are equal, round, and reactive to light.  Neck: Normal range of motion. Neck supple.  Cardiovascular: Normal rate and regular rhythm.   Respiratory: Effort normal. He has rhonchi. He exhibits no  tenderness.  GI: Soft. Bowel sounds are normal. He exhibits no distension. There is no tenderness.  Musculoskeletal: He exhibits edema. He exhibits no tenderness.  Neurological: He is alert and oriented to person, place, and time.  Left facial weakness. Mild dysarthria.  Able to answer orientation questions but tends to ramble and slightly confused.  Decreased sensation LLE Motor: B/l UE 4+5 proximal to distal RLE: 4/5 proximal to distal LLE: 4-/5 proximal to distal  Skin: Skin is warm and dry. No rash noted. No erythema.  Dressing to left neck intact  Psychiatric: He has a normal mood and affect. His speech is slurred. Cognition and memory are impaired.    Results for orders placed or performed during the hospital encounter of 10/01/15 (from the past 48 hour(s))  Urinalysis, Routine w reflex microscopic (not at [facility])     Status: None   Collection Time: 10/13/15  1:27 PM  Result Value Ref Range   Color, Urine YELLOW YELLOW   APPearance CLEAR CLEAR   Specific Gravity, Urine 1.011 1.005 - 1.030   pH 6.5 5.0 - 8.0   Glucose, UA NEGATIVE NEGATIVE mg/dL   Hgb urine dipstick NEGATIVE NEGATIVE   Bilirubin Urine NEGATIVE NEGATIVE   Ketones, ur NEGATIVE NEGATIVE mg/dL   Protein, ur NEGATIVE NEGATIVE mg/dL   Nitrite NEGATIVE NEGATIVE   Leukocytes, UA NEGATIVE NEGATIVE    Comment: MICROSCOPIC NOT DONE ON URINES WITH NEGATIVE PROTEIN, BLOOD, LEUKOCYTES, NITRITE, OR GLUCOSE <1000 mg/dL.  Glucose, capillary     Status: Abnormal   Collection Time: 10/13/15  4:17 PM  Result Value Ref Range   Glucose-Capillary 164 (H) 65 - 99 mg/dL   Comment 1 Notify RN      Comment 2 Document in Chart   Glucose, capillary     Status: Abnormal   Collection Time: 10/13/15 10:13 PM  Result Value Ref Range   Glucose-Capillary 145 (H) 65 - 99 mg/dL   Comment 1 Notify RN    Comment 2 Document in Chart   CBC with Differential/Platelet     Status: Abnormal   Collection Time: 10/14/15  4:46 AM  Result Value Ref  Range   WBC 7.4 4.0 - 10.5 K/uL   RBC 3.22 (L) 4.22 - 5.81 MIL/uL   Hemoglobin 9.4 (L) 13.0 - 17.0 g/dL   HCT 28.9 (L) 39.0 - 52.0 %   MCV 89.8 78.0 - 100.0 fL   MCH 29.2 26.0 - 34.0 pg   MCHC 32.5 30.0 - 36.0 g/dL   RDW 14.0 11.5 - 15.5 %   Platelets 439 (H) 150 - 400 K/uL   Neutrophils Relative % 66 %   Neutro Abs 4.9 1.7 - 7.7 K/uL   Lymphocytes Relative 21 %   Lymphs Abs 1.6 0.7 - 4.0 K/uL   Monocytes Relative 8 %   Monocytes Absolute 0.6 0.1 - 1.0 K/uL   Eosinophils Relative 4 %   Eosinophils Absolute 0.3 0.0 - 0.7 K/uL   Basophils Relative 1 %   Basophils Absolute 0.0 0.0 - 0.1 K/uL  Renal function panel     Status: Abnormal   Collection Time: 10/14/15  4:46 AM  Result Value Ref Range   Sodium 141 135 - 145 mmol/L   Potassium 3.2 (L) 3.5 - 5.1 mmol/L   Chloride 107 101 - 111 mmol/L   CO2 27 22 - 32 mmol/L   Glucose, Bld 114 (H) 65 - 99 mg/dL   BUN 7 6 - 20 mg/dL   Creatinine, Ser 0.70 0.61 - 1.24 mg/dL   Calcium 8.9 8.9 - 10.3 mg/dL   Phosphorus 2.8 2.5 - 4.6 mg/dL   Albumin 2.5 (L) 3.5 - 5.0 g/dL   GFR calc non Af Amer >60 >60 mL/min   GFR calc Af Amer >60 >60 mL/min    Comment: (NOTE) The eGFR has been calculated using the CKD EPI equation. This calculation has not been validated in all clinical situations. eGFR's persistently <60 mL/min signify possible Chronic Kidney Disease.    Anion gap 7 5 - 15  Magnesium     Status: Abnormal   Collection Time: 10/14/15  4:46 AM  Result Value Ref Range   Magnesium 1.6 (L) 1.7 - 2.4 mg/dL  Glucose, capillary     Status: Abnormal   Collection Time: 10/14/15  6:19 AM  Result Value Ref Range   Glucose-Capillary 110 (H) 65 - 99 mg/dL   Comment 1 Notify RN    Comment 2 Document in Chart   Glucose, capillary     Status: Abnormal   Collection Time: 10/14/15 11:28 AM  Result Value Ref Range   Glucose-Capillary 127 (H) 65 - 99 mg/dL  Glucose, capillary     Status: Abnormal   Collection Time: 10/14/15  4:27 PM  Result Value  Ref Range   Glucose-Capillary 120 (H) 65 - 99 mg/dL  Glucose, capillary     Status: Abnormal   Collection Time: 10/14/15 10:45 PM  Result Value Ref Range   Glucose-Capillary 160 (H) 65 - 99 mg/dL   Comment 1 Notify RN    Comment 2 Document in Chart   CBC with Differential/Platelet     Status: Abnormal   Collection Time: 10/15/15  5:31 AM  Result   Value Ref Range   WBC 8.4 4.0 - 10.5 K/uL   RBC 3.21 (L) 4.22 - 5.81 MIL/uL   Hemoglobin 9.4 (L) 13.0 - 17.0 g/dL   HCT 29.2 (L) 39.0 - 52.0 %   MCV 91.0 78.0 - 100.0 fL   MCH 29.3 26.0 - 34.0 pg   MCHC 32.2 30.0 - 36.0 g/dL   RDW 14.0 11.5 - 15.5 %   Platelets 476 (H) 150 - 400 K/uL   Neutrophils Relative % 64 %   Neutro Abs 5.4 1.7 - 7.7 K/uL   Lymphocytes Relative 23 %   Lymphs Abs 1.9 0.7 - 4.0 K/uL   Monocytes Relative 9 %   Monocytes Absolute 0.7 0.1 - 1.0 K/uL   Eosinophils Relative 4 %   Eosinophils Absolute 0.3 0.0 - 0.7 K/uL   Basophils Relative 0 %   Basophils Absolute 0.0 0.0 - 0.1 K/uL  Magnesium     Status: None   Collection Time: 10/15/15  5:31 AM  Result Value Ref Range   Magnesium 1.9 1.7 - 2.4 mg/dL  Renal function panel     Status: Abnormal   Collection Time: 10/15/15  5:31 AM  Result Value Ref Range   Sodium 139 135 - 145 mmol/L   Potassium 3.8 3.5 - 5.1 mmol/L   Chloride 106 101 - 111 mmol/L   CO2 28 22 - 32 mmol/L   Glucose, Bld 113 (H) 65 - 99 mg/dL   BUN 12 6 - 20 mg/dL   Creatinine, Ser 0.86 0.61 - 1.24 mg/dL   Calcium 8.9 8.9 - 10.3 mg/dL   Phosphorus 3.9 2.5 - 4.6 mg/dL   Albumin 2.5 (L) 3.5 - 5.0 g/dL   GFR calc non Af Amer >60 >60 mL/min   GFR calc Af Amer >60 >60 mL/min    Comment: (NOTE) The eGFR has been calculated using the CKD EPI equation. This calculation has not been validated in all clinical situations. eGFR's persistently <60 mL/min signify possible Chronic Kidney Disease.    Anion gap 5 5 - 15  Glucose, capillary     Status: Abnormal   Collection Time: 10/15/15  6:33 AM  Result  Value Ref Range   Glucose-Capillary 109 (H) 65 - 99 mg/dL   Comment 1 Notify RN    Comment 2 Document in Chart   Glucose, capillary     Status: Abnormal   Collection Time: 10/15/15 11:25 AM  Result Value Ref Range   Glucose-Capillary 119 (H) 65 - 99 mg/dL   Comment 1 Notify RN    Comment 2 Document in Chart    No results found.     Medical Problem List and Plan: 1.  Gait abnormality, weakness, cognitive deficits secondary to left frontoparietal infarct, lumbar spondylolisthesis/radiculopathy, lewy body dementia. 2.  DVT Prophylaxis/Anticoagulation:  Add Lovenox dopplers.  3. Pain Management: Tylenol prn.  4. Mood: LCSW to follow for evaluation and support.  5. Neuropsych: This patient is not capable of making decisions on his own behalf. 6. Skin/Wound Care: Monitor wound for healing. Routine pressure relief measures.  7. Fluids/Electrolytes/Nutrition: Monitor  I/O. Check lytes in am--has been having loose stools.  8. HTN: Monitor BP bid and titrate as needed. Continue Lasix and Norvasc daily--off normadyne.  9. OSA?: Will need sleep study on outpatient basis. Encourage IS with flutter valve.  10. ABLA: Will add iron supplement.  11. Possible Asp PNA v/s HAP: has completed treatment.  12. Hypokalemia: Resolved with supplementation--question due to diarrhea or diuretics. Check   levels in am.  13.  Protein calorie malnutrition: Add protein supplements.  14. T2DM:  Monitor ac/hs and use SSI for elevated BS. Was on metformin at home.  15. Urinary retention: Will check PVRs. Check UA/UCS to rule out UTI as cause of delirium.  Cath for volumes > 350 cc. 16. Lewy body dementia?:  To be re-evaluated in 4-6 weeks after discharge.  17.Gout: Monitor for flares    Post Admission Physician Evaluation: 1. Functional deficits secondary  to left frontoparietal infarct, lumbar spondylolisthesis/radiculopathy, lewy body dementia. 2. Patient is admitted to receive collaborative, interdisciplinary  care between the physiatrist, rehab nursing staff, and therapy team. 3. Patient's level of medical complexity and substantial therapy needs in context of that medical necessity cannot be provided at a lesser intensity of care such as a SNF. 4. Patient has experienced substantial functional loss from his/her baseline which was documented above under the "Functional History" and "Functional Status" headings.  Judging by the patient's diagnosis, physical exam, and functional history, the patient has potential for functional progress which will result in measurable gains while on inpatient rehab.  These gains will be of substantial and practical use upon discharge  in facilitating mobility and self-care at the household level. 5. Physiatrist will provide 24 hour management of medical needs as well as oversight of the therapy plan/treatment and provide guidance as appropriate regarding the interaction of the two. 6. 24 hour rehab nursing will assist with bladder management, bowel management, safety, skin/wound care, disease management, medication administration, pain management and patient education and help integrate therapy concepts, techniques,education, etc. 7. PT will assess and treat for/with: Lower extremity strength, range of motion, stamina, balance, functional mobility, safety, adaptive techniques and equipment, woundcare, coping skills, pain control, stroke education.   Goals are: Supervision/Mod I. 8. OT will assess and treat for/with: ADL's, functional mobility, safety, upper extremity strength, adaptive techniques and equipment, wound mgt, ego support, and community reintegration.   Goals are: Supervision/Mod I. Therapy may not proceed with showering this patient. 9. SLP will assess and treat for/with: Cognition.  Goals are: Supervision/Mod I. 10. Case Management and Social Worker will assess and treat for psychological issues and discharge planning. 11. Team conference will be held weekly to  assess progress toward goals and to determine barriers to discharge. 12. Patient will receive at least 3 hours of therapy per day at least 5 days per week. 13. ELOS: 14-17 days. 14. Prognosis:  good  Ankit Patel, MD 10/15/2015 

## 2015-10-15 NOTE — Progress Notes (Signed)
Sutures removed, patient tolerated well 

## 2015-10-15 NOTE — Progress Notes (Signed)
Danny Patterson Signed Physical Medicine and Rehabilitation  PMR Pre-admission Date of Service: 10/15/2015 11:34 AM  Related encounter: Admission (Current) from 10/01/2015 in [facility] 31M NEURO MEDICAL       [] Hide copied text PMR Admission Patterson Pre-Admission Assessment  Patient: Danny Patterson is an 70 y.o., male MRN: [id number] DOB: [birth year] Height: 5\' 10"  (177.8 cm) Weight: 105 kg (231 lb 7.7 oz)                                                                                                                                                                                                                                                                          Insurance Information HMO:    PPO: yes     PCP:      IPA:      80/20:      OTHER:  PRIMARY: [employer]      Policy#:  [id number]      Subscriber:  self CM Name:  Danny Patterson approval and will follow up via EMR access   Phone#:  [phone]     Fax#:  [phone] Pre-Cert#:  [id number]                   Employer:  Self employed Benefits:  Phone #:  [phone]    Name:  Danny Patterson. Date:  03/22/14     Deduct:  $0      Out of Pocket Max:  $3400      Life Max:  n/a CIR:  $225 days 1-6 then no copay      SNF: 100% days 1-20; $150 days 21-100 Outpatient:       Co-Pay:  $15 copay Home Health:        Co-Pay: $25 DME:  80%     Co-Pay:  20% Providers:  In network SECONDARY:       Policy#:       Subscriber:  CM Name:       Phone#:      Fax#:  Pre-Cert#:       Employer:  Benefits:  Phone #:      Name:  Eff. Date:  Deduct:       Out of Pocket Max:       Life Max:  CIR:       SNF:  Outpatient:      Co-Pay:  Home Health:       Co-Pay:  DME:      Co-Pay:   Medicaid Application Date:       Case Manager:  Disability Application Date:       Case Worker:   Emergency Contact Information        Contact Information    Name Relation Home Work Mobile   Danny Patterson  Spouse [phone]  [phone]     Current Medical History  Patient Admitting Diagnosis: lumbar spondylolisthesis/radiculopathy with post-op respiratory failure/encephalopathy History of Present Illness: Danny Patterson an 70 y.o.malewho was admitted to the hospital on 07/12 for bilateral L3- L5 laminectomy with diskectomy and interbody fusion by Dr. Jeral Fruit. Post op course complicated hypoxia felt to be due to OSA and oversedation progressing to AMS, hypotension and acute respiratory failure requiring intubation from 07/16. CTA chest negative for PE. He was treated for septic shock due to HCAP or aspiration PNA and had elevated troponin's due to demand ischemia. TEE done showing normal EF 55-60% with no effusion and no evidence of elevated filling pressures. He tolerated extubation on 07/20 but continued to have agitation with confusion and slurred speech requiring sitters for safety. Neurology consulted and MRI brain done 7/24 revealing possible 4 mm restricted diffusion left frontoparietal region question early or subacute infarct and evidence of small vessel disease. Neurology felt that patient with possible small ischemic change but not sufficient to explain patient's symptoms.   Cognitive evaluation showed moderate deficits with STM deficits and MoCA Score 13/30. Family did report patient having recent changes in personality, being more irritable and getting confused/lossing tract during occasionally during conversation, mild tremor as well as shuffling gait with frequent LOB. Dr. Armstrong/Neurology felt that patient's symptoms highly suggestive of Lewy body dementia and to be reevaluated 4-6 weeks on outpatient basis. 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   Has been transfused with multiple units PRBC for ABLA and H/H has been stable. He has required foley due to issues with  urinary retention. Foley discontinued with last PVR 850 cc. . Blood cultures/respiratory cultures negative  Pt. Was admitted to IP rehab 10/15/15 for ongoing medical and therapeutic management.        Past Medical History      Past Medical History:  Diagnosis Date  . Allergy   . Arthritis   . Diabetes mellitus without complication (HCC)   . Gout   . Hyperlipidemia   . Hypertension   . Kidney stone   . Rheumatic fever     Family History  family history includes Kidney disease in his maternal grandmother; Parkinson's disease (age of onset: 10) in his father.  Prior Rehab/Hospitalizations:  Has the patient had major surgery during 100 days prior to admission? No  Current Medications   Current Facility-Administered Medications:  .  acetaminophen (TYLENOL) tablet 650 mg, 650 mg, Oral, Q4H PRN, 650 mg at 10/12/15 2325 **OR** acetaminophen (TYLENOL) suppository 650 mg, 650 mg, Rectal, Q4H PRN, Hilda Lias, MD, 650 mg at 10/09/15 0410 .  amLODipine (NORVASC) tablet 5 mg, 5 mg, Oral, Daily, Roslynn Amble, MD, 5 mg at 10/15/15 1022 .  aspirin tablet 325 mg, 325 mg, Oral, Daily, Haydee Salter,  MD, 325 mg at 10/15/15 1022 .  feeding supplement (ENSURE ENLIVE) (ENSURE ENLIVE) liquid 237 mL, 237 mL, Oral, BID BM, Hilda Lias, MD, 237 mL at 10/15/15 1000 .  furosemide (LASIX) tablet 20 mg, 20 mg, Oral, Daily, Simonne Martinet, NP, 20 mg at 10/15/15 1022 .  insulin aspart (novoLOG) injection 0-5 Units, 0-5 Units, Subcutaneous, QHS, Roslynn Amble, MD .  insulin aspart (novoLOG) injection 0-9 Units, 0-9 Units, Subcutaneous, TID WC, Roslynn Amble, MD, 1 Units at 10/14/15 1233 .  potassium chloride SA (K-DUR,KLOR-CON) CR tablet 40 mEq, 40 mEq, Oral, Daily, Haydee Salter, MD, 40 mEq at 10/15/15 1022 .  simvastatin (ZOCOR) tablet 40 mg, 40 mg, Oral, Daily, Roslynn Amble, MD, 40 mg at 10/15/15 1022 .  sodium chloride flush (NS) 0.9 % injection 10-40 mL, 10-40 mL,  Intracatheter, Q12H, Hilda Lias, MD, 10 mL at 10/15/15 1000 .  sodium chloride flush (NS) 0.9 % injection 10-40 mL, 10-40 mL, Intracatheter, PRN, Hilda Lias, MD, 20 mL at 10/14/15 1906 .  tamsulosin (FLOMAX) capsule 0.4 mg, 0.4 mg, Oral, Daily, Simonne Martinet, NP, 0.4 mg at 10/15/15 1022 .  traZODone (DESYREL) tablet 50 mg, 50 mg, Oral, QHS PRN, Haydee Salter, MD  Patients Current Diet: Diet regular Room service appropriate?: Yes; Fluid consistency:: Thin  Precautions / Restrictions Precautions Precautions: Back Precaution Booklet Issued: No Precaution Comments: Reviewed back precautions with pt in regards to functional activities Spinal Brace: Lumbar corset, Applied in supine position Restrictions Weight Bearing Restrictions: No   Has the patient had 2 or more falls or a fall with injury in the past year?No, (however had a "near fall down 2 steps" but was assisted by daughter  Prior Activity Level Community (5-7x/wk): 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 None of the above  Prior Functional Level Prior Function Level of Independence: Independent Comments: was driving despite pain in right foot per chart review  Self Care: Did the patient need help bathing, dressing, using the toilet or eating?  Independent  Indoor Mobility: Did the patient need assistance with walking from room to room (with or without device)? Independent  Stairs: Did the patient need assistance with internal or external stairs (with or without device)? Independent  Functional Cognition: Did the patient need help planning regular tasks such as shopping or remembering to take medications? Independent  Current Functional Level Cognition Arousal/Alertness:  Awake/alert Overall Cognitive Status: Impaired/Different from baseline Current Attention Level: Sustained Orientation Level: Oriented X4 Following Commands: Follows one step commands consistently Safety/Judgement: Decreased awareness of safety, Decreased awareness of deficits General Comments: Pt oriented to time but not to location or situation.  Attention: Focused, Sustained, Selective Focused Attention: Appears intact Sustained Attention: Impaired Sustained Attention Impairment: Verbal basic Selective Attention: Impaired Selective Attention Impairment: Verbal basic Memory: Impaired Memory Impairment: Storage deficit, Retrieval deficit, Decreased recall of new information, Decreased short term memory Decreased Short Term Memory: Verbal basic Awareness: Impaired Problem Solving: Impaired Problem Solving Impairment: Verbal basic Executive Function: Reasoning, Organizing Reasoning: Impaired Reasoning Impairment: Verbal basic Organizing: Impaired Organizing Impairment: Verbal basic Behaviors: Poor frustration tolerance Safety/Judgment: Impaired Comments: 13/30 on MoCA    Extremity Assessment (includes Sensation/Coordination) Upper Extremity Assessment: Generalized weakness  Lower Extremity Assessment: Generalized weakness, RLE deficits/detail, Difficult to assess  due to impaired cognition RLE: Unable to fully assess due to pain   ADLs Overall ADL's : Needs assistance/impaired Eating/Feeding: Set up, Sitting Grooming: Minimal assistance, Sitting, Wash/dry face Grooming Details (indicate cue type and reason): Min assist for sitting balance Upper Body Bathing: Maximal assistance, Sitting Lower Body Bathing: Maximal assistance, +2 for physical assistance, Sitting/lateral leans Upper Body Dressing : Maximal assistance, Sitting Upper Body Dressing Details (indicate cue type and reason): to doff back brace. Min assist to [facility] hospital gown Lower Body Dressing: Maximal assistance Lower  Body Dressing Details (indicate cue type and reason): to don socks at [employer] Transfer: Moderate assistance, +2 for safety/equipment, Stand-pivot, RW, Cueing for safety, Cueing for sequencing Toilet Transfer Details (indicate cue type and reason): VCs for hand placement and sequencing thorughout stand pivot. Toilet transfer simulated by stand pivot from chair to EOB. Toileting- Clothing Manipulation and Hygiene: Total assistance Functional mobility during ADLs: Moderate assistance, +2 for safety/equipment, Rolling walker General ADL Comments: Encouraged pt to be OOB at least one more time this afternoon with assist of nursing staff. Further discussed SNF vs CIR with family. Pt mod assist to boost up from chair and min assist with verbal cues to stand pivot.   Mobility Overal bed mobility: Needs Assistance Bed Mobility: Rolling, Sit to Sidelying Rolling: Mod assist Sidelying to sit: Max assist Sit to sidelying: Max assist General bed mobility comments: Cues for log roll technique. Total assist +2 to scoot up in bed.   Transfers Overall transfer level: Needs assistance Equipment used: Rolling walker (2 wheeled) Transfers: Sit to/from Stand, [employer] Transfers Sit to Stand: Mod assist Stand pivot transfers: Min assist, +2 safety/equipment General transfer comment: VCs throughout stand pivot for safety and sequencing.   Ambulation / Gait / Stairs / Wheelchair Mobility Ambulation/Gait Ambulation/Gait assistance: Mod assist, Min assist (Mod A to initiate ambulation, min A during ambulation) Ambulation Distance (Feet): 30 Feet (x 2 ) Assistive device: Rolling walker (2 wheeled) Gait Pattern/deviations: Step-to pattern, Decreased stride length, Shuffle General Gait Details: Pt increased awareness of walker placement, min A walker managment, min verbal cues for body placement in walker.+2 for chair follow.  Gait velocity: slow Gait velocity interpretation: Below normal speed for age/gender    Posture / Balance Dynamic Sitting Balance Sitting balance - Comments: Pt able to support with UE on EOB while donning gown. Balance Overall balance assessment: Needs assistance Sitting-balance support: Feet supported, Bilateral upper extremity supported Sitting balance-Leahy Scale: Poor Sitting balance - Comments: Pt able to support with UE on EOB while donning gown. Standing balance support: Bilateral upper extremity supported Standing balance-Leahy Scale: Poor Standing balance comment: RW for support   Special needs/care consideration BiPAP/CPAP  no CPM  no Continuous Drip IV   no Dialysis  no        Life Vest   no Oxygen  no Special Bed   no Trach Size  no Wound Vac (area)  no       Skin skin tear back area, surgical incision back                               Bowel mgmt: last BM 10/13/15. Loose but continent Bladder mgmt continent, difficulty starting stream Diabetic mgmt yes    Previous Home Environment Living Arrangements: Spouse/significant other  Lives With: Spouse Available Help at Discharge: Available 24 hours/day, Family Type of Home: House Home Care Services: No  Discharge Living Setting Plans  for Discharge Living Setting: Patient's home Type of Home at Discharge: House Discharge Home Layout: One level Discharge Home Access: Stairs to enter Entrance Stairs-Number of Steps: 1 then 2 steps Discharge Bathroom Shower/Tub: Walk-in shower Discharge Bathroom Toilet: Standard Discharge Bathroom Accessibility: Yes How Accessible: Accessible via walker Does the patient have any problems obtaining your medications?: No  Social/Family/Support Systems Patient Roles: Spouse, Parent Antic ipated Caregiver: Sylus Stgermain, wife and Omaree Fuqua, daughter Anticipated Caregiver's Contact Information: Hosey Burmester [phone]; Mohammed Mcandrew [phone] Ability/Limitations of Caregiver: Elease Hashimoto has RA but gets around on her own.  Lyla Son can work from home and  plans to stay with her mom and dad following his DC from [employer] Caregiver Availability: 24/7 Discharge Plan Discussed with Primary Caregiver: Yes Is Caregiver In Agreement with Plan?: Yes Does Caregiver/Family have Issues with Lodging/Transportation while Pt is in Rehab?: No   Goals/Additional Needs Patient/Family Goal for Rehab: modified independent PT; mod I and supervision OT; n/a SLP Expected length of stay: 10-15 days Cultural Considerations: n/a Dietary Needs: regualr diet, thin liquids Equipment Needs: TBA Pt/Family Agrees to Admission and willing to participate: Yes Program Orientation Provided & Reviewed with Pt/Caregiver Including Roles  & Responsibilities: Yes   Decrease burden of Care through IP rehab admission: n/a   Possible need for SNF placement upon discharge:  Not anticipated   Patient Condition: This patient's medical and functional status has changed since the consult dated: 10/10/15  in which the Rehabilitation Physician determined and documented that the patient's condition is appropriate for intensive rehabilitative care in an inpatient rehabilitation facility. See "History of Present Illness" (above) for medical update. Functional changes are: pt. is ambulating 30' x 2 with mod and min assist and RW, sit to stand with mod assist and stand pivot transfers with min assist of +2. Patient's medical and functional status update has been discussed with the Rehabilitation physician and patient remains appropriate for inpatient rehabilitation. Will admit to inpatient rehab today.   Preadmission Screen Completed By:  Danny Picking, 10/15/2015 12:49 PM ______________________________________________________________________   Discussed status with Dr. Allena Katz on 10/15/15 at  1248  and received telephone approval for admission today.  Admission Patterson:  Danny Picking, time 8413 /Date 10/15/15       Cosigned by: Ankit Karis Juba, MD at 10/15/2015 12:51 PM   Revision History

## 2015-10-15 NOTE — Clinical Social Work Note (Signed)
Patient being admitted into IP REHAB.   Clinical Social Worker will sign off for now as social work intervention is no longer needed. Please consult [place] again if new need arises.  Derenda Fennel, MSW, LCSWA [phone] 10/15/2015 3:49 PM

## 2015-10-15 NOTE — Interval H&P Note (Signed)
Danny Patterson was admitted today to Inpatient Rehabilitation with the diagnosis of left frontoparietal infarct, lumbar spondylolisthesis/radiculopathy, lewy body dementia.  The patient's history has been reviewed, patient examined, and there is no change in status.  Patient continues to be appropriate for intensive inpatient rehabilitation.  I have reviewed the patient's chart and labs.  Questions were answered to the patient's satisfaction. The PAPE has been reviewed and assessment remains appropriate.  Ankit Karis Juba 10/15/2015, 10:41 PM

## 2015-10-15 NOTE — Progress Notes (Signed)
Inpatient Rehabilitation  I have insurance approval from [facility] and MD clearance from Dr. Heron Nay (pending carotid dopplers) to admit pt. to CIR today.  I have updated pt. and daughters and pt. is anxious to begin the next rehab phase.  I also updated Fabio Neighbors RNCM  and Derenda Fennel CSW by text messaging.  Please call if questions.  Weldon Picking PT Inpatient Rehab Admissions Coordinator Cell [phone] Office [phone]

## 2015-10-15 NOTE — Progress Notes (Signed)
Discharge orders received, Pt for discharge to 4M01

## 2015-10-15 NOTE — Discharge Summary (Signed)
Physician Discharge Summary  Patient ID: Danny Patterson MRN: [id number] DOB/AGE: 06-17-1945 70 y.o.  Admit date: 10/01/2015 Discharge date: 10/15/2015  Admission Diagnoses:lumbar degenerative disc disease. Chronic foot drop  Discharge Diagnoses:  Principal Problem:   Acute respiratory failure (HCC) Active Problems:   Hypertension   Diabetes (HCC)   Gout   Hyperlipidemia   Degenerative disc disease, lumbar   Acute encephalopathy   Hypokalemia   Anemia   S/P lumbar fusion   Altered mental status   Abnormal MRI   Discharged Condition:foot better   Hospital Course: lumbar fusion. Insertion of lumbar drain for csf leak control  Consults hospitalist, rehab medicine, neurology, cardiology  Significant Diagnostic Studies:mri brain, cardiac doppler  Treatments: lumbar fusion  Discharge Exam: Blood pressure (!) 146/76, pulse 89, temperature 98.5 F (36.9 C), temperature source Oral, resp. rate 20, height 5\' 10"  (1.778 m), weight 105 kg (231 lb 7.7 oz), SpO2 97 %. Oriented x 2. 3/5 weakness right foot. Incisional pain  Disposition:      Medication List    ASK your doctor about these medications   amLODipine 10 MG tablet Commonly known as:  NORVASC Take 10 mg by mouth daily.   colchicine 0.6 MG tablet Take 0.6 mg by mouth as needed (3 on day one of flare then one daily after.).   furosemide 20 MG tablet Commonly known as:  LASIX Take 20 mg by mouth daily.   labetalol 200 MG tablet Commonly known as:  NORMODYNE Take 200 mg by mouth daily.   magnesium gluconate 500 MG tablet Commonly known as:  MAGONATE Take 500 mg by mouth daily.   metFORMIN 500 MG tablet Commonly known as:  GLUCOPHAGE Take 1 tablet each AM with breakfast.   naproxen sodium 220 MG tablet Commonly known as:  ANAPROX Take 220 mg by mouth 2 (two) times daily as needed.   simvastatin 40 MG tablet Commonly known as:  ZOCOR Take 40 mg by mouth daily.        Signed: Karn Cassis 10/15/2015, 3:39 PM

## 2015-10-15 NOTE — Progress Notes (Signed)
Subjective:  [name] is resting comfortably in bed. His neurological exam appears unchanged. He continues to work with physical therapy on his gait and balance.  Exam: Vitals:   10/15/15 0132 10/15/15 0700  BP: (!) 149/69 (!) 151/72  Pulse: 93 84  Resp: 20 20  Temp: 98.6 F (37 C) 97.9 F (36.6 C)    HEENT-  Normocephalic, no lesions, without obvious abnormality.  Normal external eye and conjunctiva.  Normal TM's bilaterally.  Normal auditory canals and external ears. Normal external nose, mucus membranes and septum.  Normal pharynx. Cardiovascular- regular rate and rhythm, S1, S2 normal, no murmur, click, rub or gallop, pulses palpable throughout   Lungs- chest clear, no wheezing, rales, normal symmetric air entry, Heart exam - S1, S2 normal, no murmur, no gallop, rate regular Abdomen- soft, non-tender; bowel sounds normal; no masses,  no organomegaly Extremities- less then 2 second capillary refill Lymph-no adenopathy palpable Musculoskeletal-no joint tenderness, deformity or swelling Skin-warm and dry, no hyperpigmentation, vitiligo, or suspicious lesions    Gen: In bed, NAD MS: There is awake alert and oriented but has difficulty on more difficult Mini-Mental Status questions he was unable to perform serial sevens. He had difficulty spelling world forwards or backwards CN: Cranial nerves II through XII are intact there was some minimal slurring of speech. Motor: Motor exam is 5 out of 5 bilaterally there is no asymmetry. Sensory: Sensation is grossly intact. Coordination: Bradykinesia cogwheeling and rigidity are noted tremor is noted on finger to nose testing DTR: Reflexes are 1-2+ throughout  Pertinent Labs/Diagnostics: Reviewed    Impression:   1. Likely Lewy body dementia: This appears stable and can be further assessed on an outpatient basis once [name] has stabilized.  2. Possible small area of ischemic change: The echocardiogram revealed no clinically significant  changes. The carotid Dopplers are pending. [name] is already on Zocor. Suggest starting aspirin.     [name] A. Hilda Blades, M.D. Neurohospitalist Phone: [phone]   10/15/2015, 9:22 AM

## 2015-10-15 NOTE — H&P (View-Only) (Signed)
Physical Medicine and Rehabilitation Admission H&P    CC: Weakness   HPI: Danny Patterson is an 70 y.o. male who was admitted to the hospital on 07/12 for bilateral L3- L5 laminectomy with diskectomy and interbody fusion by Dr. Joya Salm.  Post op course complicated acute hypercarbic respiratory failure due to OSA and oversedation progressing to AMS, hypotension and acute respiratory failure requiring intubation from 07/16.  CTA chest negative for PE. He was treated for septic shock due to HCAP or aspiration PNA and had elevated troponin's due to demand ischemia.  TEE done showing normal EF 55-60% with no effusion and no evidence of elevated filling pressures. He tolerated extubation on 07/20 but continued to have agitation with confusion and slurred speech requiring sitters for safety.  Neurology consulted and MRI brain done 7/24 revealing possible 4 mm restricted diffusion left frontoparietal region question early or subacute infarct and evidence of small vessel disease. Neurology felt that patient with possible small ischemic change but not sufficient to explain patient's symptoms.    Cognitive evaluation showed moderate deficits with STM deficits and MoCA  Score 13/30.  Family did report patient having recent changes in personality, being more irritable and getting confused/loosing track  during occasionally during conversation, mild tremor as well as shuffling gait with frequent LOB. Dr. Armstrong/Neurology felt that patient's symptoms highly suggestive of Lewy body dementia and to be reevaluated 4-6 weeks on outpatient basis. 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   Has been transfused with multiple units PRBC for ABLA and H/H has been stable.  He has required foley due to issues with urinary retention. Foley discontinued with last PVR 850 cc. . Blood cultures/respiratory cultures negative.      Review of Systems  HENT: Negative for hearing loss.   Eyes: Negative for blurred vision and double vision.  Respiratory: Negative for cough and shortness of breath.   Cardiovascular: Negative for chest pain and leg swelling.  Gastrointestinal: Positive for diarrhea. Negative for heartburn and nausea.  Musculoskeletal: Positive for joint pain (bilateral knees) and myalgias.  Skin: Positive for itching and rash.  Neurological: Positive for sensory change (had numbness BLE--better since surger) and focal weakness (right foot drop). Negative for headaches.  Psychiatric/Behavioral: Positive for memory loss. The patient does not have insomnia.   All other systems reviewed and are negative.     Past Medical History:  Diagnosis Date  . Allergy   . Arthritis   . Diabetes mellitus without complication ([place])   . Gout   . Hyperlipidemia   . Hypertension   . Kidney stone   . Rheumatic fever     Past Surgical History:  Procedure Laterality Date  . BACK SURGERY    . knot     removed from neck  . POSTERIOR LUMBAR FUSION 4 LEVEL N/A 10/01/2015   Procedure: Lumbar three-four,  Lumbar four-five,  Lumbar five-Sacrum one posterior lumbar interbody fusion with Laminotomy at Lumbar Two-Three;  Surgeon: Leeroy Cha, MD;  Location: [facility];  Service: Neurosurgery;  Laterality: N/A;   Family History  Problem Relation Age of Onset  . Parkinson's disease Father 25  . Kidney disease Maternal Grandmother     Social History:  Married. Was working part time prior to admission. Per reports that he has quit smoking 30 years ago--used to smoke 1-2 cigars for 10-12 years.   He has never used smokeless tobacco. He reports  that he drinks about alcohol about 2-3 beers per day.  He reports that he does not use drugs.     Allergies  Allergen Reactions  . Ace Inhibitors Other (See Comments)    Angioedema.    Medications Prior to Admission  Medication Sig Dispense Refill  . amLODipine  (NORVASC) 10 MG tablet Take 10 mg by mouth daily.    Marland Kitchen labetalol (NORMODYNE) 200 MG tablet Take 200 mg by mouth daily.     . magnesium gluconate (MAGONATE) 500 MG tablet Take 500 mg by mouth daily.    . metFORMIN (GLUCOPHAGE) 500 MG tablet Take 1 tablet each AM with breakfast. (Patient taking differently: Take 500 mg by mouth daily with breakfast. Take 1 tablet each AM with breakfast.) 90 tablet 3  . naproxen sodium (ANAPROX) 220 MG tablet Take 220 mg by mouth 2 (two) times daily as needed.     . colchicine 0.6 MG tablet Take 0.6 mg by mouth as needed (3 on day one of flare then one daily after.).    Marland Kitchen furosemide (LASIX) 20 MG tablet Take 20 mg by mouth daily.    . simvastatin (ZOCOR) 40 MG tablet Take 40 mg by mouth daily.       Home: Home Living Family/patient expects to be discharged to:: Inpatient rehab Living Arrangements: Spouse/significant other Available Help at Discharge: Available 24 hours/day, Family Type of Home: House  Lives With: Spouse   Functional History: Prior Function Level of Independence: Independent Comments: was driving despite pain in right foot per chart review  Functional Status:  Mobility: Bed Mobility Overal bed mobility: Needs Assistance Bed Mobility: Rolling, Sit to Sidelying Rolling: Mod assist Sidelying to sit: Max assist Sit to sidelying: Max assist General bed mobility comments: Cues for log roll technique. Total assist +2 to scoot up in bed. Transfers Overall transfer level: Needs assistance Equipment used: Rolling walker (2 wheeled) Transfers: Sit to/from Stand, [employer] Transfers Sit to Stand: Mod assist Stand pivot transfers: Min assist, +2 safety/equipment General transfer comment: VCs throughout stand pivot for safety and sequencing. Ambulation/Gait Ambulation/Gait assistance: Mod assist, Min assist (Mod A to initiate ambulation, min A during ambulation) Ambulation Distance (Feet): 30 Feet (x 2 ) Assistive device: Rolling walker (2  wheeled) Gait Pattern/deviations: Step-to pattern, Decreased stride length, Shuffle General Gait Details: Pt increased awareness of walker placement, min A walker managment, min verbal cues for body placement in walker.+2 for chair follow.  Gait velocity: slow Gait velocity interpretation: Below normal speed for age/gender    ADL: ADL Overall ADL's : Needs assistance/impaired Eating/Feeding: Set up, Sitting Grooming: Minimal assistance, Sitting, Wash/dry face Grooming Details (indicate cue type and reason): Min assist for sitting balance Upper Body Bathing: Maximal assistance, Sitting Lower Body Bathing: Maximal assistance, +2 for physical assistance, Sitting/lateral leans Upper Body Dressing : Maximal assistance, Sitting Upper Body Dressing Details (indicate cue type and reason): to doff back brace. Min assist to [facility] hospital gown Lower Body Dressing: Maximal assistance Lower Body Dressing Details (indicate cue type and reason): to don socks at [employer] Transfer: Moderate assistance, +2 for safety/equipment, Stand-pivot, RW, Cueing for safety, Cueing for sequencing Toilet Transfer Details (indicate cue type and reason): VCs for hand placement and sequencing thorughout stand pivot. Toilet transfer simulated by stand pivot from chair to EOB. Toileting- Clothing Manipulation and Hygiene: Total assistance Functional mobility during ADLs: Moderate assistance, +2 for safety/equipment, Rolling walker General ADL Comments: Encouraged pt to be OOB at least one more time this afternoon  with assist of nursing staff. Further discussed SNF vs CIR with family. Pt mod assist to boost up from chair and min assist with verbal cues to stand pivot.  Cognition: Cognition Overall Cognitive Status: Impaired/Different from baseline Arousal/Alertness: Awake/alert Orientation Level: Oriented X4 Attention: Focused, Sustained, Selective Focused Attention: Appears intact Sustained Attention:  Impaired Sustained Attention Impairment: Verbal basic Selective Attention: Impaired Selective Attention Impairment: Verbal basic Memory: Impaired Memory Impairment: Storage deficit, Retrieval deficit, Decreased recall of new information, Decreased short term memory Decreased Short Term Memory: Verbal basic Awareness: Impaired Problem Solving: Impaired Problem Solving Impairment: Verbal basic Executive Function: Reasoning, Organizing Reasoning: Impaired Reasoning Impairment: Verbal basic Organizing: Impaired Organizing Impairment: Verbal basic Behaviors: Poor frustration tolerance Safety/Judgment: Impaired Comments: 13/30 on MoCA Cognition Arousal/Alertness: Awake/alert Behavior During Therapy: WFL for tasks assessed/performed Overall Cognitive Status: Impaired/Different from baseline Area of Impairment: Safety/judgement, Problem solving, Awareness, Attention Current Attention Level: Sustained Memory: Decreased recall of precautions, Decreased short-term memory Following Commands: Follows one step commands consistently Safety/Judgement: Decreased awareness of safety, Decreased awareness of deficits Awareness: Emergent Problem Solving: Requires verbal cues General Comments: Pt oriented to time but not to location or situation.     Blood pressure 138/76, pulse 89, temperature 98.2 F (36.8 C), temperature source Oral, resp. rate 18, height _0  (1.778 m), weight 105 kg (231 lb 7.7 oz), SpO2 100 %. Physical Exam  Nursing note and vitals reviewed. Constitutional: He is oriented to person, place, and time. He appears well-developed and well-nourished.  HENT:  Head: Normocephalic and atraumatic.  Mouth/Throat: Oropharynx is clear and moist.  Eyes: Conjunctivae are normal. Pupils are equal, round, and reactive to light.  Neck: Normal range of motion. Neck supple.  Cardiovascular: Normal rate and regular rhythm.   Respiratory: Effort normal. He has rhonchi. He exhibits no  tenderness.  GI: Soft. Bowel sounds are normal. He exhibits no distension. There is no tenderness.  Musculoskeletal: He exhibits edema. He exhibits no tenderness.  Neurological: He is alert and oriented to person, place, and time.  Left facial weakness. Mild dysarthria.  Able to answer orientation questions but tends to ramble and slightly confused.  Decreased sensation LLE Motor: B/l UE 4+5 proximal to distal RLE: 4/5 proximal to distal LLE: 4-/5 proximal to distal  Skin: Skin is warm and dry. No rash noted. No erythema.  Dressing to left neck intact  Psychiatric: He has a normal mood and affect. His speech is slurred. Cognition and memory are impaired.    Results for orders placed or performed during the hospital encounter of 10/01/15 (from the past 48 hour(s))  Urinalysis, Routine w reflex microscopic (not at [facility])     Status: None   Collection Time: 10/13/15  1:27 PM  Result Value Ref Range   Color, Urine YELLOW YELLOW   APPearance CLEAR CLEAR   Specific Gravity, Urine 1.011 1.005 - 1.030   pH 6.5 5.0 - 8.0   Glucose, UA NEGATIVE NEGATIVE mg/dL   Hgb urine dipstick NEGATIVE NEGATIVE   Bilirubin Urine NEGATIVE NEGATIVE   Ketones, ur NEGATIVE NEGATIVE mg/dL   Protein, ur NEGATIVE NEGATIVE mg/dL   Nitrite NEGATIVE NEGATIVE   Leukocytes, UA NEGATIVE NEGATIVE    Comment: MICROSCOPIC NOT DONE ON URINES WITH NEGATIVE PROTEIN, BLOOD, LEUKOCYTES, NITRITE, OR GLUCOSE <1000 mg/dL.  Glucose, capillary     Status: Abnormal   Collection Time: 10/13/15  4:17 PM  Result Value Ref Range   Glucose-Capillary 164 (H) 65 - 99 mg/dL   Comment 1 Notify RN  Comment 2 Document in Chart   Glucose, capillary     Status: Abnormal   Collection Time: 10/13/15 10:13 PM  Result Value Ref Range   Glucose-Capillary 145 (H) 65 - 99 mg/dL   Comment 1 Notify RN    Comment 2 Document in Chart   CBC with Differential/Platelet     Status: Abnormal   Collection Time: 10/14/15  4:46 AM  Result Value Ref  Range   WBC 7.4 4.0 - 10.5 K/uL   RBC 3.22 (L) 4.22 - 5.81 MIL/uL   Hemoglobin 9.4 (L) 13.0 - 17.0 g/dL   HCT 28.9 (L) 39.0 - 52.0 %   MCV 89.8 78.0 - 100.0 fL   MCH 29.2 26.0 - 34.0 pg   MCHC 32.5 30.0 - 36.0 g/dL   RDW 14.0 11.5 - 15.5 %   Platelets 439 (H) 150 - 400 K/uL   Neutrophils Relative % 66 %   Neutro Abs 4.9 1.7 - 7.7 K/uL   Lymphocytes Relative 21 %   Lymphs Abs 1.6 0.7 - 4.0 K/uL   Monocytes Relative 8 %   Monocytes Absolute 0.6 0.1 - 1.0 K/uL   Eosinophils Relative 4 %   Eosinophils Absolute 0.3 0.0 - 0.7 K/uL   Basophils Relative 1 %   Basophils Absolute 0.0 0.0 - 0.1 K/uL  Renal function panel     Status: Abnormal   Collection Time: 10/14/15  4:46 AM  Result Value Ref Range   Sodium 141 135 - 145 mmol/L   Potassium 3.2 (L) 3.5 - 5.1 mmol/L   Chloride 107 101 - 111 mmol/L   CO2 27 22 - 32 mmol/L   Glucose, Bld 114 (H) 65 - 99 mg/dL   BUN 7 6 - 20 mg/dL   Creatinine, Ser 0.70 0.61 - 1.24 mg/dL   Calcium 8.9 8.9 - 10.3 mg/dL   Phosphorus 2.8 2.5 - 4.6 mg/dL   Albumin 2.5 (L) 3.5 - 5.0 g/dL   GFR calc non Af Amer >60 >60 mL/min   GFR calc Af Amer >60 >60 mL/min    Comment: (NOTE) The eGFR has been calculated using the CKD EPI equation. This calculation has not been validated in all clinical situations. eGFR's persistently <60 mL/min signify possible Chronic Kidney Disease.    Anion gap 7 5 - 15  Magnesium     Status: Abnormal   Collection Time: 10/14/15  4:46 AM  Result Value Ref Range   Magnesium 1.6 (L) 1.7 - 2.4 mg/dL  Glucose, capillary     Status: Abnormal   Collection Time: 10/14/15  6:19 AM  Result Value Ref Range   Glucose-Capillary 110 (H) 65 - 99 mg/dL   Comment 1 Notify RN    Comment 2 Document in Chart   Glucose, capillary     Status: Abnormal   Collection Time: 10/14/15 11:28 AM  Result Value Ref Range   Glucose-Capillary 127 (H) 65 - 99 mg/dL  Glucose, capillary     Status: Abnormal   Collection Time: 10/14/15  4:27 PM  Result Value  Ref Range   Glucose-Capillary 120 (H) 65 - 99 mg/dL  Glucose, capillary     Status: Abnormal   Collection Time: 10/14/15 10:45 PM  Result Value Ref Range   Glucose-Capillary 160 (H) 65 - 99 mg/dL   Comment 1 Notify RN    Comment 2 Document in Chart   CBC with Differential/Platelet     Status: Abnormal   Collection Time: 10/15/15  5:31 AM  Result  Value Ref Range   WBC 8.4 4.0 - 10.5 K/uL   RBC 3.21 (L) 4.22 - 5.81 MIL/uL   Hemoglobin 9.4 (L) 13.0 - 17.0 g/dL   HCT 29.2 (L) 39.0 - 52.0 %   MCV 91.0 78.0 - 100.0 fL   MCH 29.3 26.0 - 34.0 pg   MCHC 32.2 30.0 - 36.0 g/dL   RDW 14.0 11.5 - 15.5 %   Platelets 476 (H) 150 - 400 K/uL   Neutrophils Relative % 64 %   Neutro Abs 5.4 1.7 - 7.7 K/uL   Lymphocytes Relative 23 %   Lymphs Abs 1.9 0.7 - 4.0 K/uL   Monocytes Relative 9 %   Monocytes Absolute 0.7 0.1 - 1.0 K/uL   Eosinophils Relative 4 %   Eosinophils Absolute 0.3 0.0 - 0.7 K/uL   Basophils Relative 0 %   Basophils Absolute 0.0 0.0 - 0.1 K/uL  Magnesium     Status: None   Collection Time: 10/15/15  5:31 AM  Result Value Ref Range   Magnesium 1.9 1.7 - 2.4 mg/dL  Renal function panel     Status: Abnormal   Collection Time: 10/15/15  5:31 AM  Result Value Ref Range   Sodium 139 135 - 145 mmol/L   Potassium 3.8 3.5 - 5.1 mmol/L   Chloride 106 101 - 111 mmol/L   CO2 28 22 - 32 mmol/L   Glucose, Bld 113 (H) 65 - 99 mg/dL   BUN 12 6 - 20 mg/dL   Creatinine, Ser 0.86 0.61 - 1.24 mg/dL   Calcium 8.9 8.9 - 10.3 mg/dL   Phosphorus 3.9 2.5 - 4.6 mg/dL   Albumin 2.5 (L) 3.5 - 5.0 g/dL   GFR calc non Af Amer >60 >60 mL/min   GFR calc Af Amer >60 >60 mL/min    Comment: (NOTE) The eGFR has been calculated using the CKD EPI equation. This calculation has not been validated in all clinical situations. eGFR's persistently <60 mL/min signify possible Chronic Kidney Disease.    Anion gap 5 5 - 15  Glucose, capillary     Status: Abnormal   Collection Time: 10/15/15  6:33 AM  Result  Value Ref Range   Glucose-Capillary 109 (H) 65 - 99 mg/dL   Comment 1 Notify RN    Comment 2 Document in Chart   Glucose, capillary     Status: Abnormal   Collection Time: 10/15/15 11:25 AM  Result Value Ref Range   Glucose-Capillary 119 (H) 65 - 99 mg/dL   Comment 1 Notify RN    Comment 2 Document in Chart    No results found.     Medical Problem List and Plan: 1.  Gait abnormality, weakness, cognitive deficits secondary to left frontoparietal infarct, lumbar spondylolisthesis/radiculopathy, lewy body dementia. 2.  DVT Prophylaxis/Anticoagulation:  Add Lovenox dopplers.  3. Pain Management: Tylenol prn.  4. Mood: LCSW to follow for evaluation and support.  5. Neuropsych: This patient is not capable of making decisions on his own behalf. 6. Skin/Wound Care: Monitor wound for healing. Routine pressure relief measures.  7. Fluids/Electrolytes/Nutrition: Monitor  I/O. Check lytes in am--has been having loose stools.  8. HTN: Monitor BP bid and titrate as needed. Continue Lasix and Norvasc daily--off normadyne.  9. OSA?: Will need sleep study on outpatient basis. Encourage IS with flutter valve.  10. ABLA: Will add iron supplement.  11. Possible Asp PNA v/s HAP: has completed treatment.  12. Hypokalemia: Resolved with supplementation--question due to diarrhea or diuretics. Check  levels in am.  13.  Protein calorie malnutrition: Add protein supplements.  14. T2DM:  Monitor ac/hs and use SSI for elevated BS. Was on metformin at home.  15. Urinary retention: Will check PVRs. Check UA/UCS to rule out UTI as cause of delirium.  Cath for volumes > 350 cc. 16. Lewy body dementia?:  To be re-evaluated in 4-6 weeks after discharge.  17.Gout: Monitor for flares    Post Admission Physician Evaluation: 1. Functional deficits secondary  to left frontoparietal infarct, lumbar spondylolisthesis/radiculopathy, lewy body dementia. 2. Patient is admitted to receive collaborative, interdisciplinary  care between the physiatrist, rehab nursing staff, and therapy team. 3. Patient's level of medical complexity and substantial therapy needs in context of that medical necessity cannot be provided at a lesser intensity of care such as a SNF. 4. Patient has experienced substantial functional loss from his/her baseline which was documented above under the "Functional History" and "Functional Status" headings.  Judging by the patient's diagnosis, physical exam, and functional history, the patient has potential for functional progress which will result in measurable gains while on inpatient rehab.  These gains will be of substantial and practical use upon discharge  in facilitating mobility and self-care at the household level. 5. Physiatrist will provide 24 hour management of medical needs as well as oversight of the therapy plan/treatment and provide guidance as appropriate regarding the interaction of the two. 6. 24 hour rehab nursing will assist with bladder management, bowel management, safety, skin/wound care, disease management, medication administration, pain management and patient education and help integrate therapy concepts, techniques,education, etc. 7. PT will assess and treat for/with: Lower extremity strength, range of motion, stamina, balance, functional mobility, safety, adaptive techniques and equipment, woundcare, coping skills, pain control, stroke education.   Goals are: Supervision/Mod I. 8. OT will assess and treat for/with: ADL's, functional mobility, safety, upper extremity strength, adaptive techniques and equipment, wound mgt, ego support, and community reintegration.   Goals are: Supervision/Mod I. Therapy may not proceed with showering this patient. 9. SLP will assess and treat for/with: Cognition.  Goals are: Supervision/Mod I. 10. Case Management and Social Worker will assess and treat for psychological issues and discharge planning. 11. Team conference will be held weekly to  assess progress toward goals and to determine barriers to discharge. 12. Patient will receive at least 3 hours of therapy per day at least 5 days per week. 13. ELOS: 14-17 days. 14. Prognosis:  good  Delice Lesch, MD 10/15/2015

## 2015-10-15 NOTE — PMR Pre-admission (Signed)
PMR Admission Coordinator Pre-Admission Assessment  Patient: Danny Patterson is an 70 y.o., male MRN: [id number] DOB: 12/16/1945 Height: 5\' 10"  (177.8 cm) Weight: 105 kg (231 lb 7.7 oz)              Insurance Information HMO:    PPO: yes     PCP:      IPA:      80/20:      OTHER:  PRIMARY: [employer]      Policy#:  [id number]      Subscriber:  self CM Name:  Danny Patterson approval and will follow up via EMR access   Phone#:  [phone]     Fax#:  [phone] Pre-Cert#:  [id number]       Employer:  Self employed Benefits:  Phone #:  [phone]    Name:  Danny Patterson. Patterson:  03/22/14     Deduct:  $0      Out of Pocket Max:  $3400      Life Max:  n/a CIR:  $225 days 1-6 then no copay      SNF: 100% days 1-20; $150 days 21-100 Outpatient:       Co-Pay:  $15 copay Home Health:        Co-Pay: $25 DME:  80%     Co-Pay:  20% Providers:  In network SECONDARY:       Policy#:       Subscriber:  CM Name:       Phone#:      Fax#:  Pre-Cert#:       Employer:  Benefits:  Phone #:      Name:  Danny Patterson:      Deduct:       Out of Pocket Max:       Life Max:  CIR:       SNF:  Outpatient:      Co-Pay:  Home Health:       Co-Pay:  DME:      Co-Pay:   Medicaid Application Patterson:       Case Manager:  Disability Application Patterson:       Case Worker:   Emergency Contact Information Contact Information    Name Relation Home Work Mobile   Danny Patterson Spouse [phone]  [phone]     Current Medical History  Patient Admitting Diagnosis: lumbar spondylolisthesis/radiculopathy with post-op respiratory failure/encephalopathy History of Present Illness: Danny Patterson an 70 y.o.malewho was admitted to the hospital on 07/12 for bilateral L3- L5 laminectomy with diskectomy and interbody fusion by Dr. Jeral Fruit.  Post op course complicated hypoxia felt to be due to OSA and oversedation progressing to AMS, hypotension and acute respiratory failure requiring intubation from 07/16.  CTA chest  negative for PE. He was treated for septic shock due to HCAP or aspiration PNA and had elevated troponin's due to demand ischemia.  TEE done showing normal EF 55-60% with no effusion and no evidence of elevated filling pressures. He tolerated extubation on 07/20 but continued to have agitation with confusion and slurred speech requiring sitters for safety.  Neurology consulted and MRI brain done 7/24 revealing possible 4 mm restricted diffusion left frontoparietal region question early or subacute infarct and evidence of small vessel disease. Neurology felt that patient with possible small ischemic change but not sufficient to explain patient's symptoms.    Cognitive evaluation showed moderate deficits with STM deficits and MoCA  Score 13/30.  Family did report patient having recent changes in personality,  being more irritable and getting confused/lossing tract  during occasionally during conversation, mild tremor as well as shuffling gait with frequent LOB. Dr. Armstrong/Neurology felt that patient's symptoms highly suggestive of Lewy body dementia and to be reevaluated 4-6 weeks on outpatient basis. 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   Has been transfused with multiple units PRBC for ABLA and H/H has been stable.  He has required foley due to issues with urinary retention. Foley discontinued with last PVR 850 cc. . Blood cultures/respiratory cultures negative  Pt. Was admitted to IP rehab 10/15/15 for ongoing medical and therapeutic management.        Past Medical History  Past Medical History:  Diagnosis Patterson  . Allergy   . Arthritis   . Diabetes mellitus without complication (HCC)   . Gout   . Hyperlipidemia   . Hypertension   . Kidney stone   . Rheumatic fever     Family History  family history includes Kidney disease in his maternal grandmother; Parkinson's disease (age of onset:  34) in his father.  Prior Rehab/Hospitalizations:  Has the patient had major surgery during 100 days prior to admission? No  Current Medications   Current Facility-Administered Medications:  .  acetaminophen (TYLENOL) tablet 650 mg, 650 mg, Oral, Q4H PRN, 650 mg at 10/12/15 2325 **OR** acetaminophen (TYLENOL) suppository 650 mg, 650 mg, Rectal, Q4H PRN, Hilda Lias, MD, 650 mg at 10/09/15 0410 .  amLODipine (NORVASC) tablet 5 mg, 5 mg, Oral, Daily, Roslynn Amble, MD, 5 mg at 10/15/15 1022 .  aspirin tablet 325 mg, 325 mg, Oral, Daily, Haydee Salter, MD, 325 mg at 10/15/15 1022 .  feeding supplement (ENSURE ENLIVE) (ENSURE ENLIVE) liquid 237 mL, 237 mL, Oral, BID BM, Hilda Lias, MD, 237 mL at 10/15/15 1000 .  furosemide (LASIX) tablet 20 mg, 20 mg, Oral, Daily, Simonne Martinet, NP, 20 mg at 10/15/15 1022 .  insulin aspart (novoLOG) injection 0-5 Units, 0-5 Units, Subcutaneous, QHS, Roslynn Amble, MD .  insulin aspart (novoLOG) injection 0-9 Units, 0-9 Units, Subcutaneous, TID WC, Roslynn Amble, MD, 1 Units at 10/14/15 1233 .  potassium chloride SA (K-DUR,KLOR-CON) CR tablet 40 mEq, 40 mEq, Oral, Daily, Haydee Salter, MD, 40 mEq at 10/15/15 1022 .  simvastatin (ZOCOR) tablet 40 mg, 40 mg, Oral, Daily, Roslynn Amble, MD, 40 mg at 10/15/15 1022 .  sodium chloride flush (NS) 0.9 % injection 10-40 mL, 10-40 mL, Intracatheter, Q12H, Hilda Lias, MD, 10 mL at 10/15/15 1000 .  sodium chloride flush (NS) 0.9 % injection 10-40 mL, 10-40 mL, Intracatheter, PRN, Hilda Lias, MD, 20 mL at 10/14/15 1906 .  tamsulosin (FLOMAX) capsule 0.4 mg, 0.4 mg, Oral, Daily, Simonne Martinet, NP, 0.4 mg at 10/15/15 1022 .  traZODone (DESYREL) tablet 50 mg, 50 mg, Oral, QHS PRN, Haydee Salter, MD  Patients Current Diet: Diet regular Room service appropriate?: Yes; Fluid consistency:: Thin  Precautions / Restrictions Precautions Precautions: Back Precaution Booklet Issued:  No Precaution Comments: Reviewed back precautions with pt in regards to functional activities Spinal Brace: Lumbar corset, Applied in supine position Restrictions Weight Bearing Restrictions: No   Has the patient had 2 or more falls or a fall with injury in the past year?No, (however had a "near fall down 2 steps" but was assisted by daughter  Prior Activity Level Community (5-7x/wk): 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 None of the above  Prior Functional Level Prior Function Level of Independence: Independent Comments: was driving despite pain in right foot per chart review  Self Care: Did the patient need help bathing, dressing, using the toilet or eating?  Independent  Indoor Mobility: Did the patient need assistance with walking from room to room (with or without device)? Independent  Stairs: Did the patient need assistance with internal or external stairs (with or without device)? Independent  Functional Cognition: Did the patient need help planning regular tasks such as shopping or remembering to take medications? Independent  Current Functional Level Cognition  Arousal/Alertness: Awake/alert Overall Cognitive Status: Impaired/Different from baseline Current Attention Level: Sustained Orientation Level: Oriented X4 Following Commands: Follows one step commands consistently Safety/Judgement: Decreased awareness of safety, Decreased awareness of deficits General Comments: Pt oriented to time but not to location or situation.  Attention: Focused, Sustained, Selective Focused Attention: Appears intact Sustained Attention: Impaired Sustained Attention Impairment: Verbal basic Selective Attention: Impaired Selective Attention Impairment:  Verbal basic Memory: Impaired Memory Impairment: Storage deficit, Retrieval deficit, Decreased recall of new information, Decreased short term memory Decreased Short Term Memory: Verbal basic Awareness: Impaired Problem Solving: Impaired Problem Solving Impairment: Verbal basic Executive Function: Reasoning, Organizing Reasoning: Impaired Reasoning Impairment: Verbal basic Organizing: Impaired Organizing Impairment: Verbal basic Behaviors: Poor frustration tolerance Safety/Judgment: Impaired Comments: 13/30 on MoCA    Extremity Assessment (includes Sensation/Coordination)  Upper Extremity Assessment: Generalized weakness  Lower Extremity Assessment: Generalized weakness, RLE deficits/detail, Difficult to assess due to impaired cognition RLE: Unable to fully assess due to pain    ADLs  Overall ADL's : Needs assistance/impaired Eating/Feeding: Set up, Sitting Grooming: Minimal assistance, Sitting, Wash/dry face Grooming Details (indicate cue type and reason): Min assist for sitting balance Upper Body Bathing: Maximal assistance, Sitting Lower Body Bathing: Maximal assistance, +2 for physical assistance, Sitting/lateral leans Upper Body Dressing : Maximal assistance, Sitting Upper Body Dressing Details (indicate cue type and reason): to doff back brace. Min assist to [facility] hospital gown Lower Body Dressing: Maximal assistance Lower Body Dressing Details (indicate cue type and reason): to don socks at [employer] Transfer: Moderate assistance, +2 for safety/equipment, Stand-pivot, RW, Cueing for safety, Cueing for sequencing Toilet Transfer Details (indicate cue type and reason): VCs for hand placement and sequencing thorughout stand pivot. Toilet transfer simulated by stand pivot from chair to EOB. Toileting- Clothing Manipulation and Hygiene: Total assistance Functional mobility during ADLs: Moderate assistance, +2 for safety/equipment, Rolling walker General ADL Comments:  Encouraged pt to be OOB at least one more time this afternoon with assist of nursing staff. Further discussed SNF vs CIR with family. Pt mod assist to boost up from chair and min assist with verbal cues to stand pivot.    Mobility  Overal bed mobility: Needs Assistance Bed Mobility: Rolling, Sit to Sidelying Rolling: Mod assist Sidelying to sit: Max assist Sit to sidelying: Max assist General bed mobility comments: Cues for log roll technique. Total assist +2 to scoot up in bed.    Transfers  Overall transfer level: Needs assistance Equipment used: Rolling walker (2 wheeled) Transfers: Sit to/from Stand, [employer] Transfers Sit to Stand: Mod assist Stand pivot transfers: Min assist, +2 safety/equipment General transfer comment: VCs throughout stand pivot for safety and sequencing.    Ambulation /  Gait / Stairs / Wheelchair Mobility  Ambulation/Gait Ambulation/Gait assistance: Mod assist, Min assist (Mod A to initiate ambulation, min A during ambulation) Ambulation Distance (Feet): 30 Feet (x 2 ) Assistive device: Rolling walker (2 wheeled) Gait Pattern/deviations: Step-to pattern, Decreased stride length, Shuffle General Gait Details: Pt increased awareness of walker placement, min A walker managment, min verbal cues for body placement in walker.+2 for chair follow.  Gait velocity: slow Gait velocity interpretation: Below normal speed for age/gender    Posture / Balance Dynamic Sitting Balance Sitting balance - Comments: Pt able to support with UE on EOB while donning gown. Balance Overall balance assessment: Needs assistance Sitting-balance support: Feet supported, Bilateral upper extremity supported Sitting balance-Leahy Scale: Poor Sitting balance - Comments: Pt able to support with UE on EOB while donning gown. Standing balance support: Bilateral upper extremity supported Standing balance-Leahy Scale: Poor Standing balance comment: RW for support    Special needs/care  consideration BiPAP/CPAP  no CPM  no Continuous Drip IV   no Dialysis  no        Life Vest   no Oxygen  no Special Bed   no Trach Size  no Wound Vac (area)  no       Skin skin tear back area, surgical incision back                               Bowel mgmt: last BM 10/13/15. Loose but continent Bladder mgmt continent, difficulty starting stream Diabetic mgmt yes     Previous Home Environment Living Arrangements: Spouse/significant other  Lives With: Spouse Available Help at Discharge: Available 24 hours/day, Family Type of Home: House Home Care Services: No  Discharge Living Setting Plans for Discharge Living Setting: Patient's home Type of Home at Discharge: House Discharge Home Layout: One level Discharge Home Access: Stairs to enter Entrance Stairs-Number of Steps: 1 then 2 steps Discharge Bathroom Shower/Tub: Walk-in shower Discharge Bathroom Toilet: Standard Discharge Bathroom Accessibility: Yes How Accessible: Accessible via walker Does the patient have any problems obtaining your medications?: No  Social/Family/Support Systems Patient Roles: Spouse, Parent Anticipated Caregiver: Bowden Boody, wife and Kayven Aldaco, daughter Anticipated Caregiver's Contact Information: Jayme Cham [phone]; Chilton Sallade [phone] Ability/Limitations of Caregiver: Elease Hashimoto has RA but gets around on her own.  Lyla Son can work from home and plans to stay with her mom and dad following his DC from [employer] Caregiver Availability: 24/7 Discharge Plan Discussed with Primary Caregiver: Yes Is Caregiver In Agreement with Plan?: Yes Does Caregiver/Family have Issues with Lodging/Transportation while Pt is in Rehab?: No   Goals/Additional Needs Patient/Family Goal for Rehab: modified independent PT; mod I and supervision OT; n/a SLP Expected length of stay: 10-15 days Cultural Considerations: n/a Dietary Needs: regualr diet, thin liquids Equipment Needs: TBA Pt/Family Agrees to  Admission and willing to participate: Yes Program Orientation Provided & Reviewed with Pt/Caregiver Including Roles  & Responsibilities: Yes   Decrease burden of Care through IP rehab admission: n/a   Possible need for SNF placement upon discharge:  Not anticipated   Patient Condition: This patient's medical and functional status has changed since the consult dated: 10/10/15  in which the Rehabilitation Physician determined and documented that the patient's condition is appropriate for intensive rehabilitative care in an inpatient rehabilitation facility. See "History of Present Illness" (above) for medical update. Functional changes are: pt. is ambulating 30' x 2 with mod and min assist and RW, sit to stand  with mod assist and stand pivot transfers with min assist of +2. Patient's medical and functional status update has been discussed with the Rehabilitation physician and patient remains appropriate for inpatient rehabilitation. Will admit to inpatient rehab today.   Preadmission Screen Completed By:  Weldon Picking, 10/15/2015 12:49 PM ______________________________________________________________________   Discussed status with Dr. Allena Katz on 10/15/15 at  1248  and received telephone approval for admission today.  Admission Coordinator:  Weldon Picking, time 1248 Dorna Bloom 10/15/15

## 2015-10-15 NOTE — Progress Notes (Signed)
Patient ID: Danny Patterson, male   DOB: 11/16/1945, 70 y.o.   MRN: [id number] Much better mentally. Asking questions about the future and restrictions . Wound dry. Waiting for rehab

## 2015-10-15 NOTE — Care Management Note (Signed)
Case Management Note  Patient Details  Name: Danny Patterson MRN: [id number] Date of Birth: 07-31-1945  Subjective/Objective:                    Action/Plan: Pt discharging to CIR today. No further needs per CM.   Expected Discharge Date:                  Expected Discharge Plan:  IP Rehab Facility  In-House Referral:     Discharge planning Services  CM Consult  Post Acute Care Choice:    Choice offered to:     DME Arranged:    DME Agency:     HH Arranged:    HH Agency:     Status of Service:  Completed, signed off  If discussed at [employer] of Stay Meetings, dates discussed:    Additional Comments:  Kermit Balo, RN 10/15/2015, 4:18 PM

## 2015-10-15 NOTE — Progress Notes (Signed)
Pt arrived to unit at 1800 with wife at bedside. RN reviewed safety plan, schedule, and rehab process with pt and wife with verbal understanding. Call bell within reach, bed alarm on and wife at bedside.

## 2015-10-15 NOTE — Progress Notes (Signed)
Physical Therapy Treatment Patient Details Name: Danny Patterson MRN: [id number] DOB: [birth year] Today's Date: 10/15/2015    History of Present Illness Patient is a 70 yo male s/p Lumbar three-four, Lumbar four-five, Lumbar five-Sacrum one posterior lumbar interbody fusion with Laminotomy at Lumbar Two-Three complicated by post op dural tear/csf leak and then required intubation for respiratory insufficiency, hypoxia, shock, extubated 7/20.    PT Comments    Pt with significant improvement in ambulation tolerance this date and was able to interact and process PT's commands in appropriate time frame. Pt cont' to benefit from CIR upon d/c to achieve mod I level function.  Follow Up Recommendations  Supervision/Assistance - 24 hour;CIR     Equipment Recommendations  Rolling walker with 5" wheels;3in1 (PT)    Recommendations for Other Services Rehab consult     Precautions / Restrictions Precautions Precautions: Back Precaution Booklet Issued: No Precaution Comments: reviewed back precautions but pt unable to recall Required Braces or Orthoses: Spinal Brace Spinal Brace: Lumbar corset;Applied in supine position Restrictions Weight Bearing Restrictions: No    Mobility  Bed Mobility Overal bed mobility: Needs Assistance Bed Mobility: Rolling;Sit to Sidelying Rolling: Min assist Sidelying to sit: Mod assist       General bed mobility comments: v/c's for log roll technique  Transfers Overall transfer level: Needs assistance Equipment used: Rolling walker (2 wheeled) Transfers: Sit to/from Stand Sit to Stand: Min assist         General transfer comment: v/c's to minimize bending, bed raised  Ambulation/Gait Ambulation/Gait assistance: Min assist;+2 safety/equipment Ambulation Distance (Feet): 75 Feet Assistive device: Rolling walker (2 wheeled) Gait Pattern/deviations: Step-to pattern;Decreased stride length;Shuffle;Trunk flexed Gait velocity: slow Gait velocity  interpretation: Below normal speed for age/gender General Gait Details: max v/c's to stay in walker and assume erect posture. Pt did not require rest break this date   Stairs            Wheelchair Mobility    Modified Rankin (Stroke Patients Only)       Balance Overall balance assessment: Needs assistance Sitting-balance support: Feet supported;Bilateral upper extremity supported Sitting balance-Leahy Scale: Poor     Standing balance support: Bilateral upper extremity supported Standing balance-Leahy Scale: Poor Standing balance comment: RW for support                    Cognition Arousal/Alertness: Awake/alert Behavior During Therapy: WFL for tasks assessed/performed Overall Cognitive Status: Impaired/Different from baseline Area of Impairment: Memory     Memory: Decreased recall of precautions;Decreased short-term memory Following Commands: Follows one step commands with increased time Safety/Judgement: Decreased awareness of safety;Decreased awareness of deficits Awareness: Emergent Problem Solving: Requires verbal cues General Comments: pt re-oriented to date and situation    Exercises      General Comments General comments (skin integrity, edema, etc.): Pt with improved interaction with PT and able to process and problem solve during treatement      Pertinent Vitals/Pain Pain Assessment: Faces Faces Pain Scale: Hurts even more Pain Location: back Pain Descriptors / Indicators: Discomfort Pain Intervention(s): Monitored during session    Home Living                      Prior Function            PT Goals (current goals can now be found in the care plan section) Acute Rehab PT Goals Patient Stated Goal: walk Progress towards PT goals: Progressing toward goals    Frequency  Min 5X/week    PT Plan Current plan remains appropriate    Co-evaluation             End of Session Equipment Utilized During Treatment: Gait  belt;Back brace Activity Tolerance: Patient limited by fatigue Patient left: in chair;with chair alarm set;with nursing/sitter in room;with family/visitor present     Time: 1520-1539 PT Time Calculation (min) (ACUTE ONLY): 19 min  Charges:  $Gait Training: 8-22 mins                    G Codes:      Marcene Brawn 10/15/2015, 4:12 PM   Lewis Shock, PT, DPT Pager #: [phone] Office #: [phone]

## 2015-10-16 ENCOUNTER — Inpatient Hospital Stay (HOSPITAL_COMMUNITY): Payer: PPO | Admitting: Occupational Therapy

## 2015-10-16 ENCOUNTER — Inpatient Hospital Stay (HOSPITAL_COMMUNITY): Payer: PPO | Admitting: Physical Therapy

## 2015-10-16 ENCOUNTER — Inpatient Hospital Stay (HOSPITAL_COMMUNITY): Payer: PPO

## 2015-10-16 DIAGNOSIS — R609 Edema, unspecified: Secondary | ICD-10-CM

## 2015-10-16 DIAGNOSIS — M4726 Other spondylosis with radiculopathy, lumbar region: Secondary | ICD-10-CM | POA: Diagnosis not present

## 2015-10-16 LAB — URINALYSIS, ROUTINE W REFLEX MICROSCOPIC
BILIRUBIN URINE: NEGATIVE
Glucose, UA: NEGATIVE mg/dL
Hgb urine dipstick: NEGATIVE
Ketones, ur: NEGATIVE mg/dL
Leukocytes, UA: NEGATIVE
NITRITE: NEGATIVE
PH: 6 (ref 5.0–8.0)
Protein, ur: NEGATIVE mg/dL
SPECIFIC GRAVITY, URINE: 1.017 (ref 1.005–1.030)

## 2015-10-16 LAB — VAS US CAROTID
LCCADSYS: -81 cm/s
LCCAPDIAS: 12 cm/s
LEFT ECA DIAS: -3 cm/s
LEFT VERTEBRAL DIAS: -19 cm/s
LICADDIAS: -34 cm/s
LICADSYS: -113 cm/s
LICAPDIAS: -16 cm/s
LICAPSYS: -74 cm/s
Left CCA dist dias: -20 cm/s
Left CCA prox sys: 85 cm/s
RCCAPDIAS: 14 cm/s
RCCAPSYS: 80 cm/s
RIGHT ECA DIAS: -6 cm/s
RIGHT VERTEBRAL DIAS: -17 cm/s
Right cca dist sys: -80 cm/s

## 2015-10-16 LAB — CBC WITH DIFFERENTIAL/PLATELET
BASOS ABS: 0.1 10*3/uL (ref 0.0–0.1)
Basophils Relative: 1 %
EOS ABS: 0.4 10*3/uL (ref 0.0–0.7)
EOS PCT: 5 %
HCT: 28.8 % — ABNORMAL LOW (ref 39.0–52.0)
Hemoglobin: 9.5 g/dL — ABNORMAL LOW (ref 13.0–17.0)
Lymphocytes Relative: 20 %
Lymphs Abs: 1.7 10*3/uL (ref 0.7–4.0)
MCH: 29.5 pg (ref 26.0–34.0)
MCHC: 33 g/dL (ref 30.0–36.0)
MCV: 89.4 fL (ref 78.0–100.0)
MONO ABS: 0.8 10*3/uL (ref 0.1–1.0)
Monocytes Relative: 9 %
Neutro Abs: 5.8 10*3/uL (ref 1.7–7.7)
Neutrophils Relative %: 65 %
PLATELETS: 494 10*3/uL — AB (ref 150–400)
RBC: 3.22 MIL/uL — AB (ref 4.22–5.81)
RDW: 13.8 % (ref 11.5–15.5)
WBC: 8.8 10*3/uL (ref 4.0–10.5)

## 2015-10-16 LAB — COMPREHENSIVE METABOLIC PANEL
ALT: 37 U/L (ref 17–63)
AST: 33 U/L (ref 15–41)
Albumin: 2.7 g/dL — ABNORMAL LOW (ref 3.5–5.0)
Alkaline Phosphatase: 79 U/L (ref 38–126)
Anion gap: 7 (ref 5–15)
BUN: 16 mg/dL (ref 6–20)
CHLORIDE: 107 mmol/L (ref 101–111)
CO2: 28 mmol/L (ref 22–32)
CREATININE: 1.17 mg/dL (ref 0.61–1.24)
Calcium: 9.3 mg/dL (ref 8.9–10.3)
GFR calc non Af Amer: >60 mL/min (ref >60)
Glucose, Bld: 130 mg/dL — ABNORMAL HIGH (ref 65–99)
POTASSIUM: 3.6 mmol/L (ref 3.5–5.1)
SODIUM: 142 mmol/L (ref 135–145)
Total Bilirubin: 0.5 mg/dL (ref 0.3–1.2)
Total Protein: 5.7 g/dL — ABNORMAL LOW (ref 6.5–8.1)

## 2015-10-16 LAB — GLUCOSE, CAPILLARY
GLUCOSE-CAPILLARY: 110 mg/dL — AB (ref 65–99)
Glucose-Capillary: 118 mg/dL — ABNORMAL HIGH (ref 65–99)
Glucose-Capillary: 116 mg/dL — ABNORMAL HIGH (ref 65–99)
Glucose-Capillary: 129 mg/dL — ABNORMAL HIGH (ref 65–99)

## 2015-10-16 LAB — OCCULT BLOOD X 1 CARD TO LAB, STOOL: FECAL OCCULT BLD: NEGATIVE

## 2015-10-16 MED ORDER — TRAMADOL HCL 50 MG PO TABS
25.0000 mg | ORAL_TABLET | Freq: Four times a day (QID) | ORAL | Status: DC | PRN
  Administered 2015-10-17: 25 mg via ORAL
  Filled 2015-10-16: qty 1

## 2015-10-16 MED ORDER — TRAMADOL HCL 50 MG PO TABS
25.0000 mg | ORAL_TABLET | Freq: Once | ORAL | Status: AC
  Administered 2015-10-16: 25 mg via ORAL
  Filled 2015-10-16: qty 1

## 2015-10-16 MED ORDER — ATORVASTATIN CALCIUM 20 MG PO TABS
20.0000 mg | ORAL_TABLET | Freq: Every day | ORAL | Status: DC
  Administered 2015-10-16 – 2015-10-22 (×7): 20 mg via ORAL
  Filled 2015-10-16 (×7): qty 1

## 2015-10-16 NOTE — Evaluation (Signed)
Physical Therapy Assessment and Plan  Patient Details  Name: Danny Patterson MRN: [id number] Date of Birth: 04/12/1945  PT Diagnosis: Abnormal posture, Abnormality of gait, Cognitive deficits, Difficulty walking, Impaired cognition, Impaired sensation, Low back pain, Muscle weakness and Pain in back Rehab Potential: Good ELOS: 16-18 days   Today's Date: 10/16/2015 PT Individual Time: 0800-0900 PT Individual Time Calculation (min): 60 min     Problem List:  Patient Active Problem List   Diagnosis Date Noted  . Lumbar spondylosis 10/15/2015  . Abnormal MRI   . Slurred speech   . Surgery, elective   . Loose stools   . OSA (obstructive sleep apnea)   . Acute blood loss anemia   . Pneumonia   . Urinary retention   . Lewy body dementia   . Encephalopathy   . Altered mental status   . Acute encephalopathy 10/12/2015  . Hypokalemia 10/12/2015  . Anemia 10/12/2015  . S/P lumbar fusion 10/12/2015  . Acute respiratory failure ([place])   . Degenerative disc disease, lumbar 10/01/2015  . Sciatica of right side associated with disorder of lumbosacral spine 07/21/2015  . Hypertension 02/18/2015  . Diabetes ([place]) 02/18/2015  . Gout 02/18/2015  . Hyperlipidemia 02/18/2015  . Arthritis 02/18/2015  . Encounter to establish care 02/18/2015    Past Medical History:  Past Medical History:  Diagnosis Date  . Allergy   . Arthritis   . Diabetes mellitus without complication ([place])   . Gout   . Hyperlipidemia   . Hypertension   . Kidney stone   . Rheumatic fever    Past Surgical History:  Past Surgical History:  Procedure Laterality Date  . BACK SURGERY    . knot     removed from neck  . POSTERIOR LUMBAR FUSION 4 LEVEL N/A 10/01/2015   Procedure: Lumbar three-four,  Lumbar four-five,  Lumbar five-Sacrum one posterior lumbar interbody fusion with Laminotomy at Lumbar Two-Three;  Surgeon: Leeroy Cha, MD;  Location: [facility];  Service: Neurosurgery;  Laterality: N/A;     Assessment & Plan Clinical Impression: Danny Patterson a 70 y.o.right handed malewith history of hypertension, diabetes mellitus. Patient lives with wife. Independent in driving prior to admission up until the last few months due to foot drop. 1 level home. Presented 10/01/2015 with low back pain radiating to the right lower extremity with foot drop that has progressed over the past 6 months. X-ray imaging showed degenerative disc disease lumbar spine L2-3, 3-4, 4-5 and L5-S1 with spondylolisthesis. Chronic radiculopathy. Underwent bilateral L3-4-5 laminectomy with lysis of adhesion. Discectomy at the level of L3-4, L5-S1. Pedicle screws from L3-S1. Bilateral facetectomy 10/02/2015 per Dr. Joya Salm. Back corset when out of bed. Hospital course pain management. Course complicated by respiratory insufficiency hypoxemia and shock requiring intubation. Followed by critical care pulmonary services and extubated 10/09/2015. Currently maintained on Zosyn for suspect pneumonia. Clear liquid diet and advance as tolerated. Physical therapy evaluation completed 10/10/2015 with recommendations of physical medicine rehabilitation consult.  Patient transferred to CIR on 10/15/2015 .   Patient currently requires mod with mobility secondary to muscle weakness, decreased cardiorespiratoy endurance, unbalanced muscle activation and decreased coordination, decreased attention, decreased awareness, decreased problem solving, decreased safety awareness, decreased memory and delayed processing and decreased sitting balance, decreased standing balance, decreased postural control, decreased balance strategies and difficulty maintaining precautions.  Prior to hospitalization, patient was independent  with mobility and lived with Spouse in a House home.  Home access is 2-3Stairs to enter.  Patient will  benefit from skilled PT intervention to maximize safe functional mobility, minimize fall risk and decrease caregiver burden for  planned discharge home with 24 hour supervision.  Anticipate patient will benefit from follow up [place] at discharge.  PT - End of Session Activity Tolerance: Tolerates 30+ min activity with multiple rests Endurance Deficit: Yes Endurance Deficit Description: requires seated rest breaks following all mobility tasks PT Assessment Rehab Potential (ACUTE/IP ONLY): Good Barriers to Discharge: Decreased caregiver support;Inaccessible home environment PT Patient demonstrates impairments in the following area(s): Balance;Endurance;Motor;Safety;Perception;Sensory;Pain PT Transfers Functional Problem(s): Bed Mobility;Bed to Chair;Car;Furniture PT Locomotion Functional Problem(s): Ambulation;Wheelchair Mobility;Stairs PT Plan PT Intensity: Minimum of 1-2 x/day ,45 to 90 minutes PT Frequency: 5 out of 7 days PT Duration Estimated Length of Stay: 16-18 days PT Treatment/Interventions: Ambulation/gait training;Discharge planning;Balance/vestibular training;Therapeutic Exercise;Therapeutic Activities;UE/LE Strength taining/ROM;UE/LE Coordination activities;Stair training;Functional mobility training;Neuromuscular re-education;Disease management/prevention;DME/adaptive equipment instruction;Pain management;Patient/family education;Community reintegration;Cognitive remediation/compensation PT Transfers Anticipated Outcome(s): S PT Locomotion Anticipated Outcome(s): S with LRAD PT Recommendation Recommendations for Other Services: Neuropsych consult Follow Up Recommendations: Home health PT Patient destination: Home Equipment Recommended: To be determined  Skilled Therapeutic Intervention Pt received seated in bed. Initial PT evaluation performed with modA overall as described below. Pt limited primarily by LE weakness, decreased aerobic endurance, as well as cognitive deficits including decreased memory/recall, impaired/delayed problem solving, delayed initiation. As described below, pt unaware of back  precautions, and following education was able to recall 1/3 precautions at 5 min. Throughout session educated pt regarding rehab process, goals, estimated LOS, daily schedule, and falls prevention with use of call bell for assist when pt wants to get up to use restroom or get in bed; pt agreeable to all the above. Pt is a poor historian and reported several things regarding PLOF and home environment that are contradictory to previous reports per chart review; will follow up with family when available. Pt remained seated in w/c at end of session with quick release belt intact, all needs in reach.  PT Evaluation Precautions/Restrictions Precautions Precautions: Back Precaution Comments: Pt unable to recall any precautions during first session of the day; "My doctor told me I can do just about whatever I want as long as I know my limits". Educated and repeated back, unable to recall 5 min later. Required Braces or Orthoses: Spinal Brace Spinal Brace: Lumbar corset Restrictions Weight Bearing Restrictions: No General Chart Reviewed: Yes Response to Previous Treatment: Not applicable Family/Caregiver Present: No  Pain Pain Assessment Pain Score: 3   Home Living/Prior Functioning Home Living Available Help at Discharge: Available 24 hours/day;Family (Reports wife/ family able to provide supervision support only) Type of Home: House Additional Comments: Did not address home layout due to cognitive deficits, will follow up with family as able  Lives With: Spouse Vision/Perception     Cognition Overall Cognitive Status: Impaired/Different from baseline Arousal/Alertness: Awake/alert Attention: Focused;Sustained;Selective Focused Attention: Appears intact Sustained Attention: Appears intact Selective Attention: Appears intact Memory: Impaired Memory Impairment: Storage deficit;Retrieval deficit;Decreased recall of new information;Decreased short term memory Decreased Short Term Memory:  Verbal basic Awareness: Impaired Awareness Impairment: Emergent impairment Problem Solving: Impaired Problem Solving Impairment: Verbal complex;Functional complex Executive Function: Reasoning Reasoning: Impaired Reasoning Impairment: Verbal complex;Functional complex Behaviors: Impulsive Safety/Judgment: Impaired Comments: Impulsive with decreased insight into deficits; unable to recall/ maintain spinal precautions Sensation Sensation Light Touch: Appears Intact Hot/Cold: Appears Intact Additional Comments: Pt reports "tingling" only in buttock Coordination Gross Motor Movements are Fluid and Coordinated: No Fine Motor Movements are Fluid and Coordinated: Yes Coordination and  Movement Description: Generalized weakness Motor  Motor Motor: Abnormal postural alignment and control Motor - Skilled Clinical Observations: Flexed posture and generalized weakness  Mobility Bed Mobility Bed Mobility: Rolling Left;Left Sidelying to Sit Rolling Left: 4: Min guard Left Sidelying to Sit: 3: Mod assist;HOB flat;With rails Left Sidelying to Sit Details: Verbal cues for technique;Verbal cues for precautions/safety;Verbal cues for sequencing;Manual facilitation for placement;Manual facilitation for weight shifting;Tactile cues for placement Left Sidelying to Sit Details (indicate cue type and reason): cues for hand placement, use of L elbow to A Transfers Transfers: Yes Sit to Stand: 2: Max assist;From bed;With upper extremity assist Sit to Stand Details: Verbal cues for technique;Verbal cues for precautions/safety;Tactile cues for weight shifting;Tactile cues for placement;Manual facilitation for weight shifting Sit to Stand Details (indicate cue type and reason): A for anterior weight shift, powering up through LEs Stand to Sit: 3: Mod assist Stand to Sit Details (indicate cue type and reason): Verbal cues for technique;Verbal cues for precautions/safety;Verbal cues for safe use of  DME/AE;Manual facilitation for placement Stand to Sit Details: poor eccentric control, repetitive cues for hand placement Stand Pivot Transfers: 4: Min assist;With armrests Stand Pivot Transfer Details: Verbal cues for technique;Verbal cues for precautions/safety;Tactile cues for posture;Tactile cues for sequencing;Verbal cues for safe use of DME/AE Locomotion  Ambulation Ambulation: Yes Ambulation/Gait Assistance: 4: Min assist Ambulation Distance (Feet): 100 Feet Assistive device: Rolling walker Ambulation/Gait Assistance Details: Tactile cues for posture;Verbal cues for precautions/safety;Verbal cues for gait pattern;Verbal cues for technique;Verbal cues for safe use of DME/AE Ambulation/Gait Assistance Details: cues for upright posture Gait Gait: Yes Gait Pattern: Impaired Gait Pattern: Trunk flexed;Lateral hip instability;Decreased stride length;Right foot flat;Left foot flat;Right flexed knee in stance;Left flexed knee in stance Gait velocity: decreased for age/gender norms Stairs / Additional Locomotion Stairs: No Corporate treasurer: Yes Wheelchair Assistance: 5: Financial planner Details: Verbal cues for technique;Verbal cues for precautions/safety;Verbal cues for sequencing;Manual facilitation for placement Wheelchair Propulsion: Both upper extremities Wheelchair Parts Management: Needs assistance Distance: 150'  Trunk/Postural Assessment  Cervical Assessment Cervical Assessment: Within Functional Limits Thoracic Assessment Thoracic Assessment: Exceptions to [facility] (Kyphotic; spinal precautions) Lumbar Assessment Lumbar Assessment: Exceptions to [facility] (Spinal precautions) Postural Control Postural Control: Deficits on evaluation (Overall Flexed posture )  Balance Balance Balance Assessed: Yes Static Sitting Balance Static Sitting - Balance Support: Feet supported Static Sitting - Level of Assistance: 5: Stand by assistance;4: Min  assist Static Sitting - Comment/# of Minutes: Sitting EOB Dynamic Sitting Balance Dynamic Sitting - Balance Support: Feet supported;During functional activity Dynamic Sitting - Level of Assistance: 4: Min Oncologist Standing - Balance Support: Right upper extremity supported;Left upper extremity supported Static Standing - Level of Assistance: 4: Min assist;5: Stand by assistance Dynamic Standing Balance Dynamic Standing - Balance Support: During functional activity;Right upper extremity supported;Left upper extremity supported Dynamic Standing - Level of Assistance: 4: Min assist;3: Mod assist Dynamic Standing - Comments: Standing to complete LB dressing tasks Extremity Assessment  RUE Assessment RUE Assessment: Within Functional Limits LUE Assessment LUE Assessment: Within Functional Limits RLE Assessment RLE Assessment: Exceptions to [facility] RLE Strength RLE Overall Strength: Deficits Right Hip Flexion: 3+/5 Right Knee Flexion: 4-/5 Right Knee Extension: 4-/5 Right Ankle Dorsiflexion: 4/5 Right Ankle Plantar Flexion: 4/5 LLE Assessment LLE Assessment: Exceptions to [facility] LLE Strength LLE Overall Strength: Deficits Left Hip Flexion: 4-/5 Left Knee Flexion: 4-/5 Left Knee Extension: 4/5 Left Ankle Dorsiflexion: 4/5 Left Ankle Plantar Flexion: 4/5   See Function Navigator for Current  Functional Status.   Refer to Care Plan for Long Term Goals  Recommendations for other services: Neuropsych  Discharge Criteria: Patient will be discharged from PT if patient refuses treatment 3 consecutive times without medical reason, if treatment goals not met, if there is a change in medical status, if patient makes no progress towards goals or if patient is discharged from hospital.  The above assessment, treatment plan, treatment alternatives and goals were discussed and mutually agreed upon: by patient  Luberta Mutter 10/16/2015, 9:00 AM

## 2015-10-16 NOTE — Evaluation (Signed)
Occupational Therapy Assessment and Plan  Patient Details  Name: Danny Patterson MRN: [id number] Date of Birth: 16-Sep-1945  OT Diagnosis: abnormal posture, acute pain, cognitive deficits, lumbago (low back pain), muscle weakness (generalized) and pain in thoracic spine Rehab Potential: Rehab Potential (ACUTE ONLY): Good ELOS: 14-17 days   Today's Date: 10/16/2015 OT Individual Time: 1100-1200 OT Individual Time Calculation (min): 60 min      Problem List:  Patient Active Problem List   Diagnosis Date Noted  . Lumbar spondylosis 10/15/2015  . Abnormal MRI   . Slurred speech   . Surgery, elective   . Loose stools   . OSA (obstructive sleep apnea)   . Acute blood loss anemia   . Pneumonia   . Urinary retention   . Lewy body dementia   . Encephalopathy   . Altered mental status   . Acute encephalopathy 10/12/2015  . Hypokalemia 10/12/2015  . Anemia 10/12/2015  . S/P lumbar fusion 10/12/2015  . Acute respiratory failure ([place])   . Degenerative disc disease, lumbar 10/01/2015  . Sciatica of right side associated with disorder of lumbosacral spine 07/21/2015  . Hypertension 02/18/2015  . Diabetes ([place]) 02/18/2015  . Gout 02/18/2015  . Hyperlipidemia 02/18/2015  . Arthritis 02/18/2015  . Encounter to establish care 02/18/2015    Past Medical History:  Past Medical History:  Diagnosis Date  . Allergy   . Arthritis   . Diabetes mellitus without complication ([place])   . Gout   . Hyperlipidemia   . Hypertension   . Kidney stone   . Rheumatic fever    Past Surgical History:  Past Surgical History:  Procedure Laterality Date  . BACK SURGERY    . knot     removed from neck  . POSTERIOR LUMBAR FUSION 4 LEVEL N/A 10/01/2015   Procedure: Lumbar three-four,  Lumbar four-five,  Lumbar five-Sacrum one posterior lumbar interbody fusion with Laminotomy at Lumbar Two-Three;  Surgeon: Leeroy Cha, MD;  Location: [facility];  Service: Neurosurgery;  Laterality: N/A;     Assessment & Plan Clinical Impression: Danny Patterson an 70 y.o.malewho was admitted to the hospital on 07/12 for bilateral L3- L5 laminectomy with diskectomy and interbody fusion by Dr. Joya Salm.  Post op course complicated acute hypercarbic respiratory failure due to OSA and oversedation progressing to AMS, hypotension and acute respiratory failure requiring intubation from 07/16.  CTA chest negative for PE. He was treated for septic shock due to HCAP or aspiration PNA and had elevated troponin's due to demand ischemia.  TEE done showing normal EF 55-60% with no effusion and no evidence of elevated filling pressures. He tolerated extubation on 07/20 but continued to have agitation with confusion and slurred speech requiring sitters for safety.  Neurology consulted and MRI brain done 7/24 revealing possible 4 mm restricted diffusion left frontoparietal region question early or subacute infarct and evidence of small vessel disease. Neurology felt that patient with possible small ischemic change but not sufficient to explain patient's symptoms.    Cognitive evaluation showed moderate deficits with STM deficits and MoCA  Score 13/30.  Family did report patient having recent changes in personality, being more irritable and getting confused/loosing track  during occasionally during conversation, mild tremor as well as shuffling gait with frequent LOB. Dr. Armstrong/Neurology felt that patient's symptoms highly suggestive of Lewy body dementia and to be reevaluated 4-6 weeks on outpatient basis. 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   Has been transfused with multiple units PRBC for ABLA and H/H has been stable.  He has required foley due to issues with urinary retention. Foley discontinued with last PVR 850 cc. . Blood cultures/respiratory cultures negative.   Patient transferred to CIR on 10/15/2015 .     Patient currently requires min-max with basic self-care skills secondary to muscle weakness, decreased cardiorespiratoy endurance, decreased awareness, decreased problem solving, decreased safety awareness and decreased memory and decreased sitting balance, decreased standing balance, decreased postural control and difficulty maintaining precautions.  Prior to hospitalization, patient could complete ADLs/ IADLs with independent .  Patient will benefit from skilled intervention to decrease level of assist with basic self-care skills and increase independence with basic self-care skills prior to discharge home with care partner.  Anticipate patient will require 24 hour supervision and follow up home health.  OT - End of Session Activity Tolerance: Tolerates 10 - 20 min activity with multiple rests Endurance Deficit: Yes Endurance Deficit Description: requires seated rest breaks following all mobility tasks OT Assessment Rehab Potential (ACUTE ONLY): Good OT Patient demonstrates impairments in the following area(s): Balance;Cognition;Endurance;Pain;Perception;Safety OT Basic ADL's Functional Problem(s): Grooming;Bathing;Dressing;Toileting OT Transfers Functional Problem(s): Toilet;Tub/Shower OT Plan OT Intensity: Minimum of 1-2 x/day, 45 to 90 minutes OT Frequency: 5 out of 7 days OT Duration/Estimated Length of Stay: 14-17 days OT Treatment/Interventions: Balance/vestibular training;Cognitive remediation/compensation;Community reintegration;DME/adaptive equipment instruction;Discharge planning;Functional mobility training;Neuromuscular re-education;Psychosocial support;Patient/family education;Pain management;Self Care/advanced ADL retraining;UE/LE Strength taining/ROM;Therapeutic Exercise;Therapeutic Activities;UE/LE Coordination activities OT Self Feeding Anticipated Outcome(s): Mod I OT Basic Self-Care Anticipated Outcome(s): Supervision OT Toileting Anticipated Outcome(s): Supervision OT  Bathroom Transfers Anticipated Outcome(s): Supervision OT Recommendation Patient destination: Home Follow Up Recommendations: Home health OT;24 hour supervision/assistance Equipment Recommended: To be determined   Skilled Therapeutic Intervention Pt seen for OT eval and ADL bathing/dressing session. Pt in supine upon arrival, easily awoken and agreeable to tx session. Pt not orientated to place or situation upon arrival. He transferred to EOB and spinal brace donned. Increased assist required for sit> stand, initially requiring mod-max A, however, progressing to mod-min A. He ambulated throughout room with CGA using RW. toileting task completed with steadying assist. He completed bathing/dressing seated in w/c at sink. Increased assist required for LB dressing as pt unable to cross ankle over knee- max cuing required to maintain spinal precautions as pt attempting to bend down to feet to thread pants/ don socks.  Pt left sitting in w/c at end of session, all needs in reach, chair alarm placed for safety. Educated extensively regarding need to call for assist with any mobility- pt voiced understanding.  Educated throughout session regarding role of OT, POC, OT goals, spinal precautions, fall risk, and d/c planning.   OT Evaluation Precautions/Restrictions  Precautions Precautions: Back Precaution Comments: Pt able to recall 2/3 precautions, however, does not carry over into functional tasks Required Braces or Orthoses: Spinal Brace Spinal Brace: Lumbar corset Restrictions Weight Bearing Restrictions: No General Chart Reviewed: Yes Pain Pain Assessment Pain Score: 10-Worst pain ever Pain Location: Back Pain Orientation: Mid;Lower Pain Descriptors / Indicators: Aching Pain Intervention(s): RN made aware;Repositioned;Ambulation/increased activity Home Living/Prior Functioning Home Living Available Help at Discharge: Available 24 hours/day, Family (Reports wife/ family able to provide  supervision support only) Type of Home: House Additional Comments: Did not address home layout due to cognitive deficits, will follow up with family as able  Lives With: Spouse Vision/Perception  Vision- History Baseline Vision/History: Wears glasses Wears Glasses: Reading only Patient Visual Report: No  change from baseline  Cognition Overall Cognitive Status: Impaired/Different from baseline Arousal/Alertness: Awake/alert Orientation Level: Person;Place;Situation Person: Oriented Place: Disoriented Situation: Disoriented Year: 2017 Month: July Day of Week: Correct Memory: Impaired Memory Impairment: Storage deficit;Retrieval deficit;Decreased recall of new information;Decreased short term memory Decreased Short Term Memory: Verbal basic Immediate Memory Recall: Sock;Blue;Bed Memory Recall: Blue Memory Recall Blue: With Cue Attention: Focused;Sustained;Selective Focused Attention: Appears intact Sustained Attention: Appears intact Selective Attention: Appears intact Awareness: Impaired Awareness Impairment: Emergent impairment Problem Solving: Impaired Problem Solving Impairment: Verbal complex;Functional complex Executive Function: Reasoning Reasoning: Impaired Reasoning Impairment: Verbal complex;Functional complex Behaviors: Impulsive Safety/Judgment: Impaired Comments: Impulsive with decreased insight into deficits; unable to recall/ maintain spinal precautions Sensation Sensation Light Touch: Appears Intact Hot/Cold: Appears Intact Additional Comments: Pt reports "tingling" only in buttock Coordination Gross Motor Movements are Fluid and Coordinated: No Fine Motor Movements are Fluid and Coordinated: Yes Coordination and Movement Description: Generalized weakness Motor  Motor Motor: Within Functional Limits Motor - Skilled Clinical Observations: Flexed posture and generalized weakness Trunk/Postural Assessment  Cervical Assessment Cervical Assessment: Within  Functional Limits Thoracic Assessment Thoracic Assessment: Exceptions to [facility] (Kyphotic; spinal precautions) Lumbar Assessment Lumbar Assessment: Exceptions to [facility] (Spinal precautions) Postural Control Postural Control: Deficits on evaluation (Overall Flexed posture )  Balance Balance Balance Assessed: Yes Static Sitting Balance Static Sitting - Balance Support: Feet supported Static Sitting - Level of Assistance: 5: Stand by assistance;4: Min assist Static Sitting - Comment/# of Minutes: Sitting EOB Dynamic Sitting Balance Dynamic Sitting - Balance Support: Feet supported;During functional activity Dynamic Sitting - Level of Assistance: 4: Min assist Static Standing Balance Static Standing - Balance Support: Right upper extremity supported;Left upper extremity supported Static Standing - Level of Assistance: 4: Min assist;5: Stand by assistance Dynamic Standing Balance Dynamic Standing - Balance Support: During functional activity;Right upper extremity supported;Left upper extremity supported Dynamic Standing - Level of Assistance: 4: Min assist;3: Mod assist Dynamic Standing - Comments: Standing to complete LB dressing tasks Extremity/Trunk Assessment RUE Assessment RUE Assessment: Within Functional Limits LUE Assessment LUE Assessment: Within Functional Limits   See Function Navigator for Current Functional Status.   Refer to Care Plan for Long Term Goals  Recommendations for other services: Neuropsych  Discharge Criteria: Patient will be discharged from OT if patient refuses treatment 3 consecutive times without medical reason, if treatment goals not met, if there is a change in medical status, if patient makes no progress towards goals or if patient is discharged from hospital.  The above assessment, treatment plan, treatment alternatives and goals were discussed and mutually agreed upon: by patient  Bobby Rumpf, Precious Segall C 10/16/2015, 2:10 PM

## 2015-10-16 NOTE — Progress Notes (Signed)
[facility]     PROGRESS NOTE    Subjective/Complaints: Didn't sleep well. Back hurts. Restless. Ready for breakfast.  ROS: Pt denies fever, rash/itching, headache, blurred or double vision, nausea, vomiting, abdominal pain, diarrhea, chest pain, shortness of breath, palpitations, dysuria, dizziness, bleeding, anxiety, or depression    Objective: Vital Signs: Blood pressure (!) 158/76, pulse 94, temperature 98.2 F (36.8 C), temperature source Oral, resp. rate 17, height  (1.778 m), weight 100 kg (220 lb 7.4 oz), SpO2 92 %. No results found.  Recent Labs  10/14/15 0446 10/15/15 0531  WBC 7.4 8.4  HGB 9.4* 9.4*  HCT 28.9* 29.2*  PLT 439* 476*    Recent Labs  10/14/15 0446 10/15/15 0531  NA 141 139  K 3.2* 3.8  CL 107 106  GLUCOSE 114* 113*  BUN 7 12  CREATININE 0.70 0.86  CALCIUM 8.9 8.9   CBG (last 3)   Recent Labs  10/15/15 1655 10/15/15 2041 10/16/15 0623  GLUCAP 194* 140* 110*    Wt Readings from Last 3 Encounters:  10/15/15 100 kg (220 lb 7.4 oz)  10/10/15 105 kg (231 lb 7.7 oz)  09/24/15 111.4 kg (245 lb 9.5 oz)    Physical Exam:  Constitutional: He is oriented to person, place, and time. He appears well-developed and well-nourished.  HENT:  Head: Normocephalic and atraumatic.  Mouth/Throat: Oropharynx is clear and moist.  Eyes: Conjunctivae are normal. Pupils are equal, round, and reactive to light.  Neck: Normal range of motion. Neck supple.  Cardiovascular: Normal rate and regular rhythm.   Respiratory: Effort normal. He has rhonchi. He exhibits no tenderness.  GI: Soft. Bowel sounds are normal. He exhibits no distension. There is no tenderness.  Musculoskeletal: He exhibits edema. He exhibits no tenderness.  Neurological: He is alert and oriented to person, place, and time.  Left facial weakness. Mild dysarthria.  Able to answer orientation questions but tends to ramble and slightly confused.   Decreased sensation LLE Motor: B/l UE 4+5 proximal to distal RLE: 4/5 proximal to distal LLE: 4-/5 proximal to distal  Skin: Skin is warm and dry. No rash noted. No erythema.  IJ site intact. Mild serosang drainage from back wound--otherwise well approximated.   Psychiatric: He has a normal mood and affect. His speech is slurred. Cognition and memory are impaired.   Assessment/Plan: 1. 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 Physiatrist is providing close team supervision and 24 hour management of active medical problems listed below. Physiatrist and rehab team continue to assess barriers to discharge/monitor patient progress toward functional and medical goals.  Function:  Bathing Bathing position      Bathing parts      Bathing assist        Upper Body Dressing/Undressing Upper body dressing                    Upper body assist        Lower Body Dressing/Undressing Lower body dressing                                  Lower body assist        Toileting Toileting Toileting activity did not occur: Safety/medical concerns        Toileting assist  Transfers Chair/bed transfer Chair/bed transfer activity did not occur: Safety/medical concerns           Secondary school teacher Comprehension Comprehension assist level: Understands basic 90% of the time/cues < 10% of the time  Expression Expression assist level: Expresses basic 90% of the time/requires cueing < 10% of the time.  Social Interaction Social Interaction assist level: Interacts appropriately 75 - 89% of the time - Needs redirection for appropriate language or to initiate interaction.  Problem Solving Problem solving assist level: Solves basic 75 -  89% of the time/requires cueing 10 - 24% of the time  Memory Memory assist level: Recognizes or recalls 90% of the time/requires cueing < 10% of the time   Medical Problem List and Plan: 1.  Gait abnormality, weakness, cognitive deficits secondary to left frontoparietal infarct, lumbar spondylolisthesis/radiculopathy, lewy body dementia.  -begin CIR therapies 2.  DVT Prophylaxis/Anticoagulation:  lovenox  -dopplers today  3. Pain Management: Tylenol prn.  4. Mood: LCSW to follow for evaluation and support.  5. Neuropsych: This patient is not capable of making decisions on his own behalf. 6. Skin/Wound Care: Monitor wound for healing. Routine pressure relief measures.  7. Fluids/Electrolytes/Nutrition: encourage PO  -bmet reviewed and normal 8. HTN: Monitor BP bid and titrate as needed. Continue Lasix and Norvasc daily--off normadyne.  9. OSA?: Will need sleep study on outpatient basis. Encourage IS with flutter valve.  10. ABLA: continue iron supplement.   -hgb holding at 9.4 11. Possible Asp PNA v/s HAP: has completed treatment.  12. Hypokalemia: Resolved with supplementation--  13.  Protein calorie malnutrition: continue protein supplements.  14. T2DM:  Monitor ac/hs and use SSI for elevated BS. Was on metformin at home.  15. Urinary retention:  check PVRs.    -ua neg, cx pending  -  Cath for volumes > 350 cc. 16. Lewy body dementia?:  To be re-evaluated in 4-6 weeks after discharge.  17.Gout: Monitor for flares  LOS (Days) 1 A FACE TO FACE EVALUATION WAS PERFORMED  Lydia Meng T 10/16/2015 9:02 AM

## 2015-10-16 NOTE — Progress Notes (Signed)
**  Preliminary report** Bilateral lower venous duplex exam completed. All vessels appear patent and compressible with no Baker's cysts bilaterally.  10/16/15 4:04 PM Olen Cordial RVT

## 2015-10-16 NOTE — Progress Notes (Signed)
Occupational Therapy Session Note  Patient Details  Name: Danny Patterson MRN: [id number] Date of Birth: [birth year]  Today's Date: 10/16/2015 OT Individual Time: 1334-1500 OT Individual Time Calculation (min): 86 min     Short Term Goals: Week 1:  OT Short Term Goal 1 (Week 1): Pt will recall proper use for AE during LB dressing with 3 or less VCs OT Short Term Goal 2 (Week 1): Pt will complete toileting task with supervision OT Short Term Goal 3 (Week 1): Pt will complete2 grooming tasks standing at sink with supervision in order to increase independence with ADLs OT Short Term Goal 4 (Week 1): Pt will recall 3/3 spinal precautions 3 consecutive days with less than1 VC each day  Skilled Therapeutic Interventions/Progress Updates:  Upon entering the room, pt seated in wheelchair with no c/o pain this session. OT educated pt on back precautions as pt was unable to verbalize them when asked. OT providing paper handout as well as handout with functional examples of how to not break precautions with self care. Pt verbalized understanding and by end of session, after repeated education, pt verbalized 2/3 precautions correctly. OT demonstrated and education pt on use of sock aid and long handled reacher to don/doff pants and doff socks. Pt returned demonstration with min verbal cues for proper technique. Pt verbalizing need for BM and OT propelled pt in wheelchair into bathroom with max A for stand pivot transfer onto elevated toilet seat. Pt remaining on toilet and NT arriving to provided needed supervision as therapist exited the room.   Therapy Documentation Precautions:  Precautions Precautions: Back Precaution Comments: Pt able to recall 2/3 precautions, however, does not carry over into functional tasks Required Braces or Orthoses: Spinal Brace Spinal Brace: Lumbar corset Restrictions Weight Bearing Restrictions: No General: General Chart Reviewed: Yes Vital Signs: Therapy Vitals Temp:  97.7 F (36.5 C) Temp Source: Oral Pulse Rate: 96 Resp: 18 BP: 128/70 Patient Position (if appropriate): Sitting Oxygen Therapy SpO2: 96 % O2 Device: Not Delivered Pain: Pain Assessment Pain Score: 3   See Function Navigator for Current Functional Status.   Therapy/Group: Individual Therapy  Lowella Grip 10/16/2015, 4:14 PM

## 2015-10-16 NOTE — Progress Notes (Signed)
Patient information reviewed and entered into eRehab system by Margaret Cockerill, RN, CRRN, PPS Coordinator.  Information including medical coding and functional independence measure will be reviewed and updated through discharge.     Per nursing patient was given "Data Collection Information Summary for Patients in Inpatient Rehabilitation Facilities with attached "Privacy Act Statement-Health Care Records" upon admission.  

## 2015-10-17 ENCOUNTER — Inpatient Hospital Stay (HOSPITAL_COMMUNITY): Payer: PPO | Admitting: Occupational Therapy

## 2015-10-17 ENCOUNTER — Inpatient Hospital Stay (HOSPITAL_COMMUNITY): Payer: PPO | Admitting: Physical Therapy

## 2015-10-17 ENCOUNTER — Inpatient Hospital Stay (HOSPITAL_COMMUNITY): Payer: PPO | Admitting: Speech Pathology

## 2015-10-17 DIAGNOSIS — M4726 Other spondylosis with radiculopathy, lumbar region: Secondary | ICD-10-CM | POA: Diagnosis not present

## 2015-10-17 LAB — URINE CULTURE: CULTURE: NO GROWTH

## 2015-10-17 LAB — GLUCOSE, CAPILLARY
GLUCOSE-CAPILLARY: 109 mg/dL — AB (ref 65–99)
GLUCOSE-CAPILLARY: 108 mg/dL — AB (ref 65–99)
GLUCOSE-CAPILLARY: 124 mg/dL — AB (ref 65–99)
Glucose-Capillary: 120 mg/dL — ABNORMAL HIGH (ref 65–99)

## 2015-10-17 MED ORDER — HYDROCODONE-ACETAMINOPHEN 5-325 MG PO TABS
1.0000 | ORAL_TABLET | Freq: Once | ORAL | Status: AC
  Administered 2015-10-17: 1 via ORAL
  Filled 2015-10-17: qty 1

## 2015-10-17 NOTE — Plan of Care (Signed)
Problem: RH PAIN MANAGEMENT Goal: RH STG PAIN MANAGED AT OR BELOW PT'S PAIN GOAL Outcome: Progressing 3/10     

## 2015-10-17 NOTE — Progress Notes (Signed)
[facility]     PROGRESS NOTE    Subjective/Complaints: Back sore but able to sleep. Sitting eob eating breakfast. Recalls therapy activities yesterday  ROS: Pt denies fever, rash/itching, headache, blurred or double vision, nausea, vomiting, abdominal pain, diarrhea, chest pain, shortness of breath, palpitations, dysuria, dizziness, bleeding, anxiety, or depression    Objective: Vital Signs: Blood pressure (!) 165/82, pulse 100, temperature 98 F (36.7 C), temperature source Oral, resp. rate 17, height 5\' 10"  (1.778 m), weight 100 kg (220 lb 7.4 oz), SpO2 94 %. No results found.  Recent Labs  10/15/15 0531 10/16/15 0950  WBC 8.4 8.8  HGB 9.4* 9.5*  HCT 29.2* 28.8*  PLT 476* 494*    Recent Labs  10/15/15 0531 10/16/15 0950  NA 139 142  K 3.8 3.6  CL 106 107  GLUCOSE 113* 130*  BUN 12 16  CREATININE 0.86 1.17  CALCIUM 8.9 9.3   CBG (last 3)   Recent Labs  10/16/15 1203 10/16/15 1628 10/16/15 2038  GLUCAP 118* 116* 129*    Wt Readings from Last 3 Encounters:  10/15/15 100 kg (220 lb 7.4 oz)  10/10/15 105 kg (231 lb 7.7 oz)  09/24/15 111.4 kg (245 lb 9.5 oz)    Physical Exam:  Constitutional: He is oriented to person, place, and time. He appears well-developed and well-nourished.  HENT:  Head: Normocephalic and atraumatic.  Mouth/Throat: Oropharynx is clear and moist.  Eyes: Conjunctivae are normal. Pupils are equal, round, and reactive to light.  Neck: Normal range of motion. Neck supple.  Cardiovascular: Normal rate and regular rhythm.   Respiratory: Effort normal. He has rhonchi. He exhibits no tenderness.  GI: Soft. Bowel sounds are normal. He exhibits no distension. There is no tenderness.  Musculoskeletal: He exhibits edema. He exhibits no tenderness.  Neurological: He is alert and oriented to person, place, and time.  Left facial weakness. Mild dysarthria.  Able to answer orientation questions. Recalls events  from yesterday. Appropriate conversation. Decreased sensation LLE Motor: B/l UE 4+5 proximal to distal RLE: 4/5 proximal to distal LLE: 4-/5 proximal to distal  Skin: Skin is warm and dry. No rash noted. No erythema.  IJ site intact. Back wound dry   Psychiatric: He has a normal mood and affect. His speech is clear. Cognition   impaired.   Assessment/Plan: 1. 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 Physiatrist is providing close team supervision and 24 hour management of active medical problems listed below. Physiatrist and rehab team continue to assess barriers to discharge/monitor patient progress toward functional and medical goals.  Function:  Bathing Bathing position   Position: Wheelchair/chair at sink  Bathing parts Body parts bathed by patient: Right arm, Left arm, Chest, Abdomen, Front perineal area, Buttocks, Right upper leg, Left upper leg Body parts bathed by helper: Right lower leg, Left lower leg, Back  Bathing assist Assist Level: Touching or steadying assistance(Pt > 75%)      Upper Body Dressing/Undressing Upper body dressing   What is the patient wearing?: Pull over shirt/dress, Orthosis     Pull over shirt/dress - Perfomed by patient: Thread/unthread right sleeve, Thread/unthread left sleeve, Put head through opening, Pull shirt over trunk       Orthosis activity level: Performed by helper  Upper body assist Assist Level: Set up, Supervision or verbal cues   Set up :  To obtain clothing/put away, To apply TLSO, cervical collar  Lower Body Dressing/Undressing Lower body dressing   What is the patient wearing?: Non-skid slipper socks     Pants- Performed by patient: Pull pants up/down Pants- Performed by helper: Thread/unthread right pants leg, Thread/unthread left  pants leg Non-skid slipper socks- Performed by patient: Don/doff right sock, Don/doff left sock Non-skid slipper socks- Performed by helper: Don/doff right sock, Don/doff left sock               TED Hose - Performed by helper: Don/doff right TED hose, Don/doff left TED hose  Lower body assist Assist for lower body dressing: Touching or steadying assistance (Pt > 75%)      Toileting Toileting Toileting activity did not occur: Safety/medical concerns Toileting steps completed by patient: Adjust clothing prior to toileting, Adjust clothing after toileting, Performs perineal hygiene   Toileting Assistive Devices: Grab bar or rail  Toileting assist Assist level: Touching or steadying assistance (Pt.75%)   Transfers Chair/bed transfer Chair/bed transfer activity did not occur: Safety/medical concerns Chair/bed transfer method: Stand pivot Chair/bed transfer assist level: Maximal assist (Pt 25 - 49%/lift and lower) (max sit >stand, minA stand pivot) Chair/bed transfer assistive device: Walker, Designer, fashion/clothing     Max distance: 100 Assist level: Moderate assist (Pt 50 - 74%)   Wheelchair   Type: Manual Max wheelchair distance: 100 Assist Level: Supervision or verbal cues  Cognition Comprehension Comprehension assist level: Understands basic 75 - 89% of the time/ requires cueing 10 - 24% of the time  Expression Expression assist level: Expresses basic 90% of the time/requires cueing < 10% of the time.  Social Interaction Social Interaction assist level: Interacts appropriately 90% of the time - Needs monitoring or encouragement for participation or interaction.  Problem Solving Problem solving assist level: Solves basic 25 - 49% of the time - needs direction more than half the time to initiate, plan or complete simple activities  Memory Memory assist level: Recognizes or recalls 50 - 74% of the time/requires cueing 25 - 49% of the time   Medical Problem List and  Plan: 1.  Gait abnormality, weakness, cognitive deficits secondary to left frontoparietal infarct, lumbar spondylolisthesis/radiculopathy, lewy body dementia.  -continue CIR therapies 2.  DVT Prophylaxis/Anticoagulation:  lovenox  -dopplers normal 3. Pain Management: Tylenol prn.  4. Mood: LCSW to follow for evaluation and support.  5. Neuropsych: This patient is not yet capable of making decisions on his own behalf. 6. Skin/Wound Care: Monitor wound for healing. Routine pressure relief measures.  7. Fluids/Electrolytes/Nutrition: encourage PO  -bmet normal  -no need for central line---dc 8. HTN: Monitor BP bid and titrate as needed. Continue Lasix and Norvasc daily--off normadyne.  9. OSA?: Will need sleep study on outpatient basis. Encourage IS with flutter valve.  10. ABLA: continue iron supplement.   -hgb holding at 9.4 11. Possible Asp PNA v/s HAP: has completed treatment.  12. Hypokalemia: Resolved with supplementation--  13.  Protein calorie malnutrition: continue protein supplements.  14. T2DM:  Monitor ac/hs and use SSI for elevated BS. Was on metformin at home.  15. Urinary retention: resolved.    -ua neg, cx neg  - pvr's 0. 16. Lewy body dementia?:  To be re-evaluated in 4-6 weeks after discharge.  17.Gout: Monitor for flares  LOS (Days) 2 A FACE TO FACE EVALUATION WAS PERFORMED  Jaydin Jalomo T 10/17/2015 8:38 AM

## 2015-10-17 NOTE — Progress Notes (Signed)
Per MD order, central line removed. IV cathter intact. Vaseline pressure gauze to site, pressure held x 5 min, no bleeding to site. Pt instructed not to get out of bed for 30 min after the removal of the central line. Instucted to keep dressing CDI x 24hours, if bleeding occurs hold pressure and contact staff nurse. Pt verbalized understanding and did not have any questions. Consuello Masse

## 2015-10-17 NOTE — Progress Notes (Cosign Needed)
Occupational Therapy Session Note  Patient Details  Name: Danny Patterson MRN: [id number] Date of Birth: 31-Mar-1945  Today's Date: 10/17/2015 OT Individual Time: 4765-4650 OT Individual Time Calculation (min): 90 min     Short Term Goals: Week 1:  OT Short Term Goal 1 (Week 1): Pt will recall proper use for AE during LB dressing with 3 or less VCs OT Short Term Goal 2 (Week 1): Pt will complete toileting task with supervision OT Short Term Goal 3 (Week 1): Pt will complete2 grooming tasks standing at sink with supervision in order to increase independence with ADLs OT Short Term Goal 4 (Week 1): Pt will recall 3/3 spinal precautions 3 consecutive days with less than1 VC each day  Skilled Therapeutic Interventions/Progress Updates:    Pt seen for skilled OT session focusing on self care. Pt supine upon arrival and agreeable to tx session. Pt required increased time and VC during session to recall/ follow back precautions. After instructions from OT pt was able to log roll in bed with min A and VC for technique. Pt required mod A to push up from bed set at not using hospital bed functions in simulation of home. Pt needed increased assist and increased attempts to sit to stand at RW due to decreased strength. OT educated pt on rocking forward and nose over toes technique throughout session, but pt continued to require mod-max A sit to stand and continued to need VC for technique.  Pt ambulated with RW and min A to sink to complete bathing/dressing sitting at sink with setup. Pt able to doff/donn pants with min A for balance.Pt able to recall using sock aide yesterday and was able to successfully donn socks with supervision. Pt unable to recall using reacher yesterday, but after demonstration from OT was able to utilize reacher to thread/unthread pants in order to maintain back precautions. Pt trialed using reacher and put feet in chair to donn shoes with elastic shoe strings, but took increased time and  attempts (possibly try button hook in future session). Pt ambulated to recliner and left sitting in recliner with all needs met and educated to call for assist.   Therapy Documentation Precautions:  Precautions Precautions: Back Precaution Comments: Pt unable to recall any precautions during first session of the day; "My doctor told me I can do just about whatever I want as long as I know my limits". Educated and repeated back, unable to recall 5 min later. Required Braces or Orthoses: Spinal Brace Spinal Brace: Lumbar corset Restrictions Weight Bearing Restrictions: No Pain: Denies pain See Function Navigator for Current Functional Status.   Therapy/Group: Individual Therapy  Matilde Bash 10/17/2015, 7:22 AM

## 2015-10-17 NOTE — Progress Notes (Signed)
Physical Therapy Session Note  Patient Details  Name: Danny Patterson MRN: [id number] Date of Birth: 12-06-1945  Today's Date: 10/17/2015 PT Individual Time: 8166-1969 PT Individual Time Calculation (min): 28 min    Short Term Goals:Week 1:  PT Short Term Goal 1 (Week 1): Pt will perform bed mobility minA using bed features PT Short Term Goal 2 (Week 1): Pt will perform sit <>stand minA PT Short Term Goal 3 (Week 1): Pt will ambulate 150' LRAD minA PT Short Term Goal 4 (Week 1): Pt will initiate stair training PT Short Term Goal 5 (Week 1): Pt will verbalize back precautions with min cueing from therapist on three consecutive days  Skilled Therapeutic Interventions/Progress Updates:    Pt received resting in bed with no c/o pain and agreeable to therapy session.  Supine>sit with log roll and overall steady assist with mod verbal cues for maintaining back precautions during rolling.  PT provided mod assist for pt to don LSO while seated EOB.  Sit>stand from EOB with RW and mod assist and mod verbal cues for upright posture.  Pt amb to sink with min/mod assist and initial max fade to min cues for walker positioning and upright posture.  Pt washed hands/face with close supervision for safety and balance.  W/C pulled up behind pt and pt sat with mod assist, unable to maintain back precautions and did not move UEs to armrests despite max cues.  PT demonstrated safe sitting with UEs reaching for sitting surface and pt verbalized understanding.  UEB x10 minutes for attention and overall endurance with pt instructed to change directions from forward propulsion to backwards at 5 minutes.  Pt did so with no cues.  Pt returned to room at end of session and left upright in w/c with alarm activated, call bell in reach and needs met.   Therapy Documentation Precautions:  Precautions Precautions: Back Precaution Comments: Pt unable to recall any precautions during first session of the day; "My doctor told me I  can do just about whatever I want as long as I know my limits". Educated and repeated back, unable to recall 5 min later. Required Braces or Orthoses: Spinal Brace Spinal Brace: Lumbar corset Restrictions Weight Bearing Restrictions: No   See Function Navigator for Current Functional Status.   Therapy/Group: Individual Therapy  Danny Patterson 10/17/2015, 3:24 PM

## 2015-10-17 NOTE — Progress Notes (Signed)
Physical Therapy Note  Patient Details  Name: BRENDEN MANES MRN: [id number] Date of Birth: 08-22-1945 Today's Date: 10/17/2015    Time: 1130-1154 24 minutes  1:1 No c/o pain.  Pt performed sit to stand from a variety of surfaces at a variety of heights with min A from lower surfaces, supervision from elevated surfaces.  Pt requires cues for UE placement but is able to stand with increased time.  Gait 50' x 4 with focus on upright posture and safety with RW use. Pt tends to pick up RW when turning, PT encouraged pt to make wide turns to avoid twisting and to keep RW on ground.   DONAWERTH,KAREN 10/17/2015, 11:53 AM

## 2015-10-17 NOTE — Evaluation (Signed)
Speech Language Pathology Assessment and Plan  Patient Details  Name: Danny Patterson MRN: [id number] Date of Birth: Dec 06, 1945  SLP Diagnosis: Cognitive Impairments  Rehab Potential: Good ELOS: 16-18 days     Today's Date: 10/17/2015 SLP Individual Time: 1035-1130 SLP Individual Time Calculation (min): 55 min    Problem List: Patient Active Problem List   Diagnosis Date Noted  . Osteoarthritis of spine with radiculopathy, lumbar region 10/15/2015  . Abnormal MRI   . Slurred speech   . Surgery, elective   . Loose stools   . OSA (obstructive sleep apnea)   . Acute blood loss anemia   . Pneumonia   . Urinary retention   . Lewy body dementia   . Encephalopathy   . Altered mental status   . Acute encephalopathy 10/12/2015  . Hypokalemia 10/12/2015  . Anemia 10/12/2015  . S/P lumbar fusion 10/12/2015  . Acute respiratory failure ([place])   . Degenerative disc disease, lumbar 10/01/2015  . Sciatica of right side associated with disorder of lumbosacral spine 07/21/2015  . Hypertension 02/18/2015  . Diabetes ([place]) 02/18/2015  . Gout 02/18/2015  . Hyperlipidemia 02/18/2015  . Arthritis 02/18/2015  . Encounter to establish care 02/18/2015   Past Medical History:  Past Medical History:  Diagnosis Date  . Allergy   . Arthritis   . Diabetes mellitus without complication ([place])   . Gout   . Hyperlipidemia   . Hypertension   . Kidney stone   . Rheumatic fever    Past Surgical History:  Past Surgical History:  Procedure Laterality Date  . BACK SURGERY    . knot     removed from neck  . POSTERIOR LUMBAR FUSION 4 LEVEL N/A 10/01/2015   Procedure: Lumbar three-four,  Lumbar four-five,  Lumbar five-Sacrum one posterior lumbar interbody fusion with Laminotomy at Lumbar Two-Three;  Surgeon: Leeroy Cha, MD;  Location: [facility];  Service: Neurosurgery;  Laterality: N/A;    Assessment / Plan / Recommendation Clinical Impression is an 70 y.o.malewho was admitted to the  hospital on 07/12 for bilateral L3- L5 laminectomy with diskectomy and interbody fusion by Dr. Joya Salm.  Post op course complicated acute hypercarbic respiratory failure due to OSA and oversedation progressing to AMS, hypotension and acute respiratory failure requiring intubation from 07/16.  CTA chest negative for PE. He was treated for septic shock due to HCAP or aspiration PNA and had elevated troponin's due to demand ischemia.  TEE done showing normal EF 55-60% with no effusion and no evidence of elevated filling pressures. He tolerated extubation on 07/20 but continued to have agitation with confusion and slurred speech requiring sitters for safety.  Neurology consulted and MRI brain done 7/24 revealing possible 4 mm restricted diffusion left frontoparietal region question early or subacute infarct and evidence of small vessel disease. Neurology felt that patient with possible small ischemic change but not sufficient to explain patient's symptoms. Cognitive evaluation showed moderate deficits with STM deficits and MoCA  Score 13/30.  Family did report patient having recent changes in personality, being more irritable and getting confused/loosing track  during occasionally during conversation, mild tremor as well as shuffling gait with frequent LOB. Dr. Armstrong/Neurology felt that patient's symptoms highly suggestive of Lewy body dementia and to be reevaluated 4-6 weeks on outpatient basis. 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 Has been transfused with multiple units PRBC for ABLA and H/H has been stable.  He has required foley due to issues with urinary retention. Foley discontinued with last PVR 850 cc. . Blood cultures/respiratory cultures negative. Patient transferred to [facility] 10/15/15.   Patient administered the [facility] and scored 13/22 points with a score of 18 or above considered normal. Patient  demonstrates deficits in the areas of recall, problem solving, awareness and safety. Patient also with verbosity throughout session. Patient would benefit from skilled SLP intervention to maximize cognitive function and overall functional independence prior to discharge.    Skilled Therapeutic Interventions          Administered a cognitive-linguistic evaluation. Please see above for details. Educated patient on current cognitive impairments and goals of skilled SLP intervention, he verbalized understanding.    SLP Assessment  Patient will need skilled [facility] during CIR admission    Recommendations  Oral Care Recommendations: Oral care BID Recommendations for Other Services: Neuropsych consult Patient destination: Home Follow up Recommendations: 24 hour supervision/assistance;Home Health SLP Equipment Recommended: None recommended by SLP    SLP Frequency 3 to 5 out of 7 days   SLP Duration  SLP Intensity  SLP Treatment/Interventions 16-18 days   Minumum of 1-2 x/day, 30 to 90 minutes  Cognitive remediation/compensation;Cueing hierarchy;Functional tasks;Patient/family education;Environmental controls;Internal/external aids;Therapeutic Activities    Pain Intermittent back pain, Pre medicated and repositioned  Function:  Eating Eating Eating activity did not occur: N/A               Cognition Comprehension Comprehension assist level: Understands basic 75 - 89% of the time/ requires cueing 10 - 24% of the time  Expression   Expression assist level: Expresses basic 90% of the time/requires cueing < 10% of the time.  Social Interaction Social Interaction assist level: Interacts appropriately 90% of the time - Needs monitoring or encouragement for participation or interaction.  Problem Solving Problem solving assist level: Solves basic 25 - 49% of the time - needs direction more than half the time to initiate, plan or complete simple activities   Memory Memory assist level: Recognizes or recalls 25 - 49% of the time/requires cueing 50 - 75% of the time   Short Term Goals: Week 1: SLP Short Term Goal 1 (Week 1): Patient will utilize external memory aids to recall new, daily information with Mod  A verbal and visual cues.  SLP Short Term Goal 2 (Week 1): Patient will demonstrate functional problem solving for basic and familiar tasks with Mod A verbal and visual cues.  SLP Short Term Goal 3 (Week 1): Patient will identify 2 cognitive deficits with Mod A quesiton cues.  SLP Short Term Goal 4 (Week 1): Patient will utilize call bell to request assistance in 50% of observable opportunities with Mod A question cues.   Refer to Care Plan for Long Term Goals  Recommendations for other services: Neuropsych  Discharge Criteria: Patient will be discharged from SLP if patient refuses treatment 3 consecutive times without medical reason, if treatment goals not met, if there is a change in medical status, if patient makes no progress towards goals or if patient is discharged from hospital.  The above assessment, treatment plan, treatment alternatives and goals were discussed and mutually agreed upon: by patient  Temperence Zenor 10/17/2015, 4:02 PM

## 2015-10-18 ENCOUNTER — Inpatient Hospital Stay (HOSPITAL_COMMUNITY): Payer: PPO | Admitting: Occupational Therapy

## 2015-10-18 ENCOUNTER — Inpatient Hospital Stay (HOSPITAL_COMMUNITY): Payer: PPO | Admitting: Speech Pathology

## 2015-10-18 ENCOUNTER — Inpatient Hospital Stay (HOSPITAL_COMMUNITY): Payer: PPO | Admitting: Physical Therapy

## 2015-10-18 DIAGNOSIS — M4726 Other spondylosis with radiculopathy, lumbar region: Secondary | ICD-10-CM | POA: Diagnosis not present

## 2015-10-18 LAB — GLUCOSE, CAPILLARY
GLUCOSE-CAPILLARY: 105 mg/dL — AB (ref 65–99)
GLUCOSE-CAPILLARY: 129 mg/dL — AB (ref 65–99)
Glucose-Capillary: 118 mg/dL — ABNORMAL HIGH (ref 65–99)
Glucose-Capillary: 129 mg/dL — ABNORMAL HIGH (ref 65–99)

## 2015-10-18 NOTE — Progress Notes (Signed)
Occupational Therapy Session Note  Patient Details  Name: CEEJAY Patterson MRN: [id number] Date of Birth: 05-Oct-1945  Today's Date: 10/18/2015 OT Individual Time: 1100-1200 and 1347-1400 OT Individual Time Calculation (min): 60 min and 43 min    Short Term Goals:Week 1:  OT Short Term Goal 1 (Week 1): Pt will recall proper use for AE during LB dressing with 3 or less VCs OT Short Term Goal 2 (Week 1): Pt will complete toileting task with supervision OT Short Term Goal 3 (Week 1): Pt will complete2 grooming tasks standing at sink with supervision in order to increase independence with ADLs OT Short Term Goal 4 (Week 1): Pt will recall 3/3 spinal precautions 3 consecutive days with less than1 VC each day  Skilled Therapeutic Interventions/Progress Updates:    Session One: Pt seen for OT ADL bathing/dressing session. He completed bathing/dressing seated in chair at sink, using AE to assist with LB bathing/dressing with min cues required for initiation of use. He became slightly agitated with therapist when therapist instructing pt not to stand for LB bathing/dressing until UB completed with TLSO re-donned prior to standing. Educated importance of TLSO and wearing schedule. He required min cuing throughout for adherence to spinal precautions during functional tasks, able to recall 1/3 spinal precautions.  Throughout session, pt required mod- supervision for sit <> stand transfers with VCs required during each transfer for hand placement on RW. He requested return to bed at end of session due to back pain, RN made aware of pt's complaints. VCs required for bed mobility while maintaining spinal precautions. Left in supine with all needs in reach and bed alarm on.   Session Two: Pt seen for OT session focusing on functional transfers, sit <> stand, activity tolerance and functional balance.  Pt asleep in supine upon arrival, easily awoken and agreeable to tx session. He required assist for properly  donning TLSO and was unable to accurately list spinal precautions.  In ADL apartment, pt completed simulated tub/shower transfer using tub transfer bench. With increased time, pt able to manage B LEs over tub wall using UEs for support. Discussed with pt at length pros and cons of tub/shower combo with tub transfer bench vs. Walk in shower with shower chair. Pt voiced comfort with tub transfer bench and denied trying shower stall transfer to shower chair. Will cont to benefit from practice with transfer while on IPR to increase ease and independence with transfer. He ambulated to ADL apartment, where he completed sit <> stand transfers without use of UE from sightly elevated therapy mat at close supervision level. He then completed functional reaching task standing without AD and reaching to obtain horseshoe to toss. Completed x2 Danny Patterson with CGA. Pt ambulated back to room at end of session, left sitting in recliner with all needs in reach and QRB donned. Throughout session, educated regarding spinal precautions, fall risk, DME, and d/c planning.   Therapy Documentation Precautions:  Precautions Precautions: Back Precaution Comments: Pt unable to recall any precautions during first session of the day; "My doctor told me I can do just about whatever I want as long as I know my limits". Educated and repeated back, unable to recall 5 min later. Required Braces or Orthoses: Spinal Brace Spinal Brace: Lumbar corset Restrictions Weight Bearing Restrictions: No Pain: Pain Assessment Pain Assessment: 0-10 Pain Score: 8  Faces Pain Scale: Hurts a little bit Pain Type: Surgical pain Pain Location: Back Pain Orientation: Lower Pain Descriptors / Indicators: Aching Pain Frequency: Intermittent Pain  Onset: On-going Patients Stated Pain Goal: 3 Pain Intervention(s): RN aware, repositioned, rest  See Function Navigator for Current Functional Status.   Therapy/Group: Individual Therapy  Danny Patterson  C 10/18/2015, 6:51 AM

## 2015-10-18 NOTE — Progress Notes (Signed)
Physical Therapy Note  Patient Details  Name: Danny Patterson MRN: [id number] Date of Birth: June 14, 1945 Today's Date: 10/18/2015    Time: 900-955 55 minutes  1:1 Pt c/o pain in tailbone, RN made aware, meds given during session. Pt performed sit to stand intially with min A, progressing to supervision with repetition and cuing for UE placement.  Gait in controlled environment multiple attempts with supervision with RW with pt able to progress to gait 120' before needing seated rest break.  Backward walking with focus on increasing glute strength with min A for balance.  [place] A exercises performed with min A for balance with hip abd and knee flex.  UBE x 8 minutes (4 fwd/4 bkwd) level 3.  Pt continues to require frequent rests but is increase gait distance today.   DONAWERTH,KAREN 10/18/2015, 9:56 AM

## 2015-10-18 NOTE — IPOC Note (Signed)
Overall Plan of Care [facility]) Patient Details Name: KYNGSTON PICKELSIMER MRN: [id number] DOB: 07-20-1945  Admitting Diagnosis: Lumbar Fusion  Hospital Problems: Principal Problem:   Osteoarthritis of spine with radiculopathy, lumbar region Active Problems:   Degenerative disc disease, lumbar   Acute encephalopathy   S/P lumbar fusion   Acute blood loss anemia   Lewy body dementia     Functional Problem List: Nursing Bladder, Edema, Endurance, Medication Management, Nutrition, Pain, Sensory, Skin Integrity, Safety  PT Balance, Endurance, Motor, Safety, Perception, Sensory, Pain  OT Balance, Cognition, Endurance, Pain, Perception, Safety  SLP Cognition  TR         Basic ADL's: OT Grooming, Bathing, Dressing, Toileting     Advanced  ADL's: OT       Transfers: PT Bed Mobility, Bed to Chair, Car, Occupational psychologist, Research scientist (life sciences): PT Ambulation, Psychologist, prison and probation services, Stairs     Additional Impairments: OT    SLP Social Cognition   Problem Solving, Memory, Awareness  TR      Anticipated Outcomes Item Anticipated Outcome  Self Feeding Mod I  Swallowing      Basic self-care  Marketing executive Transfers Supervision  Bowel/Bladder  manage bowel and bladder Min assist  Transfers  S  Locomotion  S with LRAD  Communication     Cognition  Min A  Pain  3 or less  Safety/Judgment  min assist   Therapy Plan: PT Intensity: Minimum of 1-2 x/day ,45 to 90 minutes PT Frequency: 5 out of 7 days PT Duration Estimated Length of Stay: 16-18 days OT Intensity: Minimum of 1-2 x/day, 45 to 90 minutes OT Frequency: 5 out of 7 days OT Duration/Estimated Length of Stay: 14-17 days SLP Intensity: Minumum of 1-2 x/day, 30 to 90 minutes SLP Frequency: 3 to 5 out of 7 days SLP Duration/Estimated Length of Stay: 16-18 days        Team Interventions: Nursing Interventions Patient/Family Education, Pain Management, Bladder Management,  Medication Management, Disease Management/Prevention, Cognitive Remediation/Compensation, Skin Care/Wound Management  PT interventions Ambulation/gait training, Discharge planning, Balance/vestibular training, Therapeutic Exercise, Therapeutic Activities, UE/LE Strength taining/ROM, UE/LE Coordination activities, Stair training, Functional mobility training, Neuromuscular re-education, Disease management/prevention, DME/adaptive equipment instruction, Pain management, Patient/family education, Community reintegration, Cognitive remediation/compensation  OT Interventions Warden/ranger, Cognitive remediation/compensation, Firefighter, Fish farm manager, Discharge planning, Functional mobility training, Neuromuscular re-education, Psychosocial support, Patient/family education, Pain management, Self Care/advanced ADL retraining, UE/LE Strength taining/ROM, Therapeutic Exercise, Therapeutic Activities, UE/LE Coordination activities  SLP Interventions Cognitive remediation/compensation, Cueing hierarchy, Functional tasks, Patient/family education, Environmental controls, Internal/external aids, Therapeutic Activities  TR Interventions    SW/CM Interventions Discharge Planning, Psychosocial Support, Patient/Family Education    Team Discharge Planning: Destination: PT-Home ,OT- Home , SLP-Home Projected Follow-up: PT-Home health PT, OT-  Home health OT, 24 hour supervision/assistance, SLP-24 hour supervision/assistance, Home Health SLP Projected Equipment Needs: PT-To be determined, OT- To be determined, SLP-None recommended by SLP Equipment Details: PT- , OT-  Patient/family involved in discharge planning: PT- Patient,  OT-Patient, SLP-Patient  MD ELOS: 14-17 days Medical Rehab Prognosis:  Excellent Assessment: The patient has been admitted for CIR therapies with the diagnosis of lumbar radic/spondylosis s/p lumbar surgery. Patient with post-op cognitive deficits  and likely lewey body dementia. The team will be addressing functional mobility, strength, stamina, balance, safety, adaptive techniques and equipment, self-care, bowel and bladder mgt, patient and caregiver education, back precautions, pain mgt, safety awareness, cognition, family education, community  reintegration. Goals have been set at supervision for mobility and self-care and min assist for cognition/memory.    Ranelle Oyster, MD, FAAPMR      See Team Conference Notes for weekly updates to the plan of care

## 2015-10-18 NOTE — Progress Notes (Signed)
Speech Language Pathology Daily Session Note  Patient Details  Name: Danny Patterson MRN: [id number] Date of Birth: Jul 08, 1945  Today's Date: 10/18/2015 SLP Individual Time: 1500-1530 SLP Individual Time Calculation (min): 30 min   Short Term Goals: Week 1: SLP Short Term Goal 1 (Week 1): Patient will utilize external memory aids to recall new, daily information with Mod  A verbal and visual cues.  SLP Short Term Goal 2 (Week 1): Patient will demonstrate functional problem solving for basic and familiar tasks with Mod A verbal and visual cues.  SLP Short Term Goal 3 (Week 1): Patient will identify 2 cognitive deficits with Mod A quesiton cues.  SLP Short Term Goal 4 (Week 1): Patient will utilize call bell to request assistance in 50% of observable opportunities with Mod A question cues.   Skilled Therapeutic Interventions:Pt seen for skilled treatment with focus on cognitive goals. Pt was agreeable to therapy, but benefited from encouragement. Pt completed basic functional math/money questions with min- mod A for repetition and occasional assist with reasoning. Pt was able to use compensatory strategy of writing down important information to assist with working memory and organization.    Function:  Eating Eating                 Cognition Comprehension Comprehension assist level: Understands basic 75 - 89% of the time/ requires cueing 10 - 24% of the time  Expression   Expression assist level: Expresses basic 90% of the time/requires cueing < 10% of the time.  Social Interaction Social Interaction assist level: Interacts appropriately 90% of the time - Needs monitoring or encouragement for participation or interaction.  Problem Solving Problem solving assist level: Solves basic 25 - 49% of the time - needs direction more than half the time to initiate, plan or complete simple activities  Memory Memory assist level: Recognizes or recalls 25 - 49% of the time/requires cueing 50 - 75%  of the time    Pain Pain Assessment Pain Assessment: No/denies pain  Therapy/Group: Individual Therapy  Rocky Crafts MA, CCC-SLP 10/18/2015, 3:46 PM

## 2015-10-18 NOTE — Progress Notes (Signed)
[facility]     PROGRESS NOTE    Subjective/Complaints: Slept well. No new issues yesterday. Back sore with increased activity  ROS: Pt denies fever, rash/itching, headache, blurred or double vision, nausea, vomiting, abdominal pain, diarrhea, chest pain, shortness of breath, palpitations, dysuria, dizziness, bleeding, anxiety, or depression    Objective: Vital Signs: Blood pressure (!) 147/72, pulse 93, temperature 98 F (36.7 C), temperature source Oral, resp. rate 18, height 5\' 10"  (1.778 m), weight 100 kg (220 lb 7.4 oz), SpO2 93 %. No results found.  Recent Labs  10/16/15 0950  WBC 8.8  HGB 9.5*  HCT 28.8*  PLT 494*    Recent Labs  10/16/15 0950  NA 142  K 3.6  CL 107  GLUCOSE 130*  BUN 16  CREATININE 1.17  CALCIUM 9.3   CBG (last 3)   Recent Labs  10/17/15 1559 10/17/15 2140 10/18/15 0659  GLUCAP 108* 124* 105*    Wt Readings from Last 3 Encounters:  10/15/15 100 kg (220 lb 7.4 oz)  10/10/15 105 kg (231 lb 7.7 oz)  09/24/15 111.4 kg (245 lb 9.5 oz)    Physical Exam:  Constitutional: He is oriented to person, place, and time. He appears well-developed and well-nourished.  HENT:  Head: Normocephalic and atraumatic.  Mouth/Throat: Oropharynx is clear and moist.  Eyes: Conjunctivae are normal. Pupils are equal, round, and reactive to light.  Neck: Normal range of motion. Neck supple.  Cardiovascular: Normal rate and regular rhythm.   Respiratory: Effort normal. He has rhonchi. He exhibits no tenderness.  GI: Soft. Bowel sounds are normal. He exhibits no distension. There is no tenderness.  Musculoskeletal: He exhibits edema. He exhibits no tenderness.  Neurological: He is alert and oriented to person, place, and time.  Left facial weakness. Mild dysarthria.  Able to answer orientation questions. Recalls events from yesterday. Appropriate conversation. Decreased sensation LLE Motor: B/l UE 4+5 proximal to  distal RLE: 4/5 proximal to distal LLE: 4-/5 proximal to distal  Skin: Skin is warm and dry. No rash noted. No erythema.  IJ site intact. Back wound dry   Psychiatric: He has a normal mood and affect. His speech is clear. Cognition   impaired.   Assessment/Plan: 1. 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 Physiatrist is providing close team supervision and 24 hour management of active medical problems listed below. Physiatrist and rehab team continue to assess barriers to discharge/monitor patient progress toward functional and medical goals.  Function:  Bathing Bathing position   Position: Wheelchair/chair at sink  Bathing parts Body parts bathed by patient: Right arm, Left arm, Chest, Abdomen, Front perineal area, Buttocks, Right upper leg, Left upper leg, Right lower leg, Left lower leg Body parts bathed by helper: Back, Buttocks  Bathing assist Assist Level: Touching or steadying assistance(Pt > 75%)      Upper Body Dressing/Undressing Upper body dressing   What is the patient wearing?: Pull over shirt/dress, Orthosis     Pull over shirt/dress - Perfomed by patient: Thread/unthread right sleeve, Thread/unthread left sleeve, Put head through opening, Pull shirt over trunk       Orthosis activity level: Performed by patient  Upper body assist Assist Level: Set up, Supervision or verbal cues   Set up : To obtain clothing/put away, To apply TLSO, cervical collar  Lower Body Dressing/Undressing Lower body dressing  What is the patient wearing?: Socks, Shoes, [employer], Pants     Pants- Performed by patient: Thread/unthread right pants leg, Thread/unthread left pants leg Pants- Performed by helper: Pull pants up/down Non-skid slipper socks- Performed by patient: Don/doff right sock,  Don/doff left sock Non-skid slipper socks- Performed by helper: Don/doff right sock, Don/doff left sock Socks - Performed by patient: Don/doff right sock, Don/doff left sock   Shoes - Performed by patient: Don/doff right shoe, Don/doff left shoe Shoes - Performed by helper: Fasten right, Fasten left       TED Hose - Performed by helper: Don/doff right TED hose, Don/doff left TED hose  Lower body assist Assist for lower body dressing: Touching or steadying assistance (Pt > 75%)      Toileting Toileting Toileting activity did not occur: Safety/medical concerns Toileting steps completed by patient: Adjust clothing prior to toileting, Performs perineal hygiene, Adjust clothing after toileting   Toileting Assistive Devices: Grab bar or rail  Toileting assist Assist level: Touching or steadying assistance (Pt.75%)   Transfers Chair/bed transfer Chair/bed transfer activity did not occur: Safety/medical concerns Chair/bed transfer method: Ambulatory Chair/bed transfer assist level: Moderate assist (Pt 50 - 74%/lift or lower) Chair/bed transfer assistive device: Armrests, Patent attorney     Max distance: 100 Assist level: Moderate assist (Pt 50 - 74%)   Wheelchair   Type: Manual Max wheelchair distance: 100 Assist Level: Supervision or verbal cues  Cognition Comprehension Comprehension assist level: Understands basic 75 - 89% of the time/ requires cueing 10 - 24% of the time  Expression Expression assist level: Expresses basic 90% of the time/requires cueing < 10% of the time.  Social Interaction Social Interaction assist level: Interacts appropriately 90% of the time - Needs monitoring or encouragement for participation or interaction.  Problem Solving Problem solving assist level: Solves basic 25 - 49% of the time - needs direction more than half the time to initiate, plan or complete simple activities  Memory Memory assist level: Recognizes or recalls 25 - 49% of  the time/requires cueing 50 - 75% of the time   Medical Problem List and Plan: 1.  Gait abnormality, weakness, cognitive deficits secondary to left frontoparietal infarct, lumbar spondylolisthesis/radiculopathy, lewy body dementia.  -continue CIR therapies  -progressing from a mobility standpoint 2.  DVT Prophylaxis/Anticoagulation:  lovenox  -dopplers normal 3. Pain Management: Tylenol prn.  4. Mood: LCSW to follow for evaluation and support.  5. Neuropsych: This patient is not yet capable of making decisions on his own behalf. 6. Skin/Wound Care: Monitor wound for healing. Routine pressure relief measures.  7. Fluids/Electrolytes/Nutrition: encourage PO  -bmet normal  -central line dc'ed 8. HTN: Monitor BP bid and titrate as needed. Continue Lasix and Norvasc daily--off normadyne.  9. OSA?: Will need sleep study on outpatient basis. Encourage IS with flutter valve.  10. ABLA: continue iron supplement.   -hgb holding at 9.4 11. Possible Asp PNA v/s HAP: has completed treatment.  12. Hypokalemia: Resolved with supplementation--  13.  Protein calorie malnutrition: continue protein supplements.  14. T2DM:  Monitor ac/hs and use SSI for elevated BS. Was on metformin at home.   -sugars under control 15. Urinary retention: resolved.    -ua neg, cx neg  - pvr's 0. 16. Lewy body dementia?:  To be re-evaluated in 4-6 weeks after discharge.  17.Gout: Monitor for flares  LOS (Days) 3 A FACE TO FACE EVALUATION WAS PERFORMED  Terrilyn Tyner T 10/18/2015 7:13 AM

## 2015-10-19 ENCOUNTER — Inpatient Hospital Stay (HOSPITAL_COMMUNITY): Payer: PPO | Admitting: Physical Therapy

## 2015-10-19 DIAGNOSIS — M4726 Other spondylosis with radiculopathy, lumbar region: Secondary | ICD-10-CM | POA: Diagnosis not present

## 2015-10-19 LAB — GLUCOSE, CAPILLARY
GLUCOSE-CAPILLARY: 167 mg/dL — AB (ref 65–99)
Glucose-Capillary: 104 mg/dL — ABNORMAL HIGH (ref 65–99)
Glucose-Capillary: 115 mg/dL — ABNORMAL HIGH (ref 65–99)
Glucose-Capillary: 135 mg/dL — ABNORMAL HIGH (ref 65–99)

## 2015-10-19 NOTE — Care Management Note (Signed)
[facility] Individual Statement of Services  Patient Name:  Danny Patterson  Date:  10/17/2015  Welcome to the [facility].  Our goal is to provide you with an individualized program based on your diagnosis and situation, designed to meet your specific needs.  With this comprehensive rehabilitation program, you will be expected to participate in at least 3 hours of rehabilitation therapies Monday-Friday, with modified therapy programming on the weekends.  Your rehabilitation program will include the following services:  Physical Therapy (PT), Occupational Therapy (OT), Speech Therapy (ST), 24 hour per day rehabilitation nursing, Therapeutic Recreaction (TR), Neuropsychology, Case Management (Social Worker), Rehabilitation Medicine, Nutrition Services and Pharmacy Services  Weekly team conferences will be held on Tuesdays to discuss your progress.  Your Social Worker will talk with you frequently to get your input and to update you on team discussions.  Team conferences with you and your family in attendance may also be held.  Expected length of stay: 16-18 days  Overall anticipated outcome: supervision  Depending on your progress and recovery, your program may change. Your Social Worker will coordinate services and will keep you informed of any changes. Your Social Worker's name and contact numbers are listed  below.  The following services may also be recommended but are not provided by the [facility]:   Driving Evaluations  Home Health Rehabiltiation Services  Outpatient Rehabilitation Services  Vocational Rehabilitation   Arrangements will be made to provide these services after discharge if needed.  Arrangements include referral to agencies that provide these services.  Your insurance has been verified to be:  [employer] Your primary doctor is:  Venora Maples  Pertinent information will be shared with your doctor  and your insurance company.  Social Worker:  [place], [place] [phone] or ([phone]   Information discussed with and copy given to patient by: Amada Jupiter, 10/19/2015, 11:18 AM

## 2015-10-19 NOTE — Progress Notes (Signed)
[facility]IC[facility] & REHABILITATION     PROGRESS NOTE    Subjective/Complaints: Slept well per staff. Pt says otherwise. Complains of pain. RN says he did well with pain overnight.   ROS: confused/N/a    Objective: Vital Signs: Blood pressure (!) 164/87, pulse 90, temperature 98.1 F (36.7 C), temperature source Oral, resp. rate 18, height 5\' 10"  (1.778 m), weight 100 kg (220 lb 7.4 oz), SpO2 100 %. No results found.  Recent Labs  10/16/15 0950  WBC 8.8  HGB 9.5*  HCT 28.8*  PLT 494*    Recent Labs  10/16/15 0950  NA 142  K 3.6  CL 107  GLUCOSE 130*  BUN 16  CREATININE 1.17  CALCIUM 9.3   CBG (last 3)   Recent Labs  10/18/15 1629 10/18/15 2022 10/19/15 0634  GLUCAP 129* 129* 104*    Wt Readings from Last 3 Encounters:  10/15/15 100 kg (220 lb 7.4 oz)  10/10/15 105 kg (231 lb 7.7 oz)  09/24/15 111.4 kg (245 lb 9.5 oz)    Physical Exam:  Constitutional: He is oriented to person, place, and time. He appears well-developed and well-nourished.  HENT:  Head: Normocephalic and atraumatic.  Mouth/Throat: Oropharynx is clear and moist.  Eyes: Conjunctivae are normal. Pupils are equal, round, and reactive to light.  Neck: Normal range of motion. Neck supple.  Cardiovascular: Normal rate and regular rhythm.   Respiratory: Effort normal. He has rhonchi. He exhibits no tenderness.  GI: Soft. Bowel sounds are normal. He exhibits no distension. There is no tenderness.  Musculoskeletal: He exhibits edema. He exhibits no tenderness.  Neurological: He is alert and oriented to person, place, and time.  Left facial weakness. Mild dysarthria.  Able to answer orientation questions. Recalls events from yesterday. Appropriate conversation. Decreased sensation LLE Motor: B/l UE 4+5 proximal to distal RLE: 4/5 proximal to distal LLE: 4-/5 proximal to distal  Skin: Skin is warm and dry. No rash noted. No erythema.  IJ site intact. Back wound dry    Psychiatric: He has a normal mood and affect. His speech is clear. Cognition   impaired. Confused and agitated this am  Assessment/Plan: 1. 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 Physiatrist is providing close team supervision and 24 hour management of active medical problems listed below. Physiatrist and rehab team continue to assess barriers to discharge/monitor patient progress toward functional and medical goals.  Function:  Bathing Bathing position   Position: Wheelchair/chair at sink  Bathing parts Body parts bathed by patient: Right arm, Left arm, Chest, Abdomen, Front perineal area, Right upper leg, Left upper leg, Right lower leg, Left lower leg Body parts bathed by helper: Back, Buttocks  Bathing assist Assist Level: Touching or steadying assistance(Pt > 75%)      Upper Body Dressing/Undressing Upper body dressing   What is the patient wearing?: Pull over shirt/dress, Orthosis     Pull over shirt/dress - Perfomed by patient: Thread/unthread right sleeve, Thread/unthread left sleeve, Put head through opening, Pull shirt over trunk       Orthosis activity level: Performed by patient  Upper body assist Assist Level: Set up, Supervision or verbal cues   Set up : To obtain clothing/put away  Lower Body Dressing/Undressing Lower body dressing   What is the patient wearing?: Pants, Non-skid slipper socks, Ted Hose     Pants- Performed by  patient: Thread/unthread right pants leg, Thread/unthread left pants leg, Pull pants up/down Pants- Performed by helper: Pull pants up/down Non-skid slipper socks- Performed by patient: Don/doff right sock, Don/doff left sock Non-skid slipper socks- Performed by helper: Don/doff right sock, Don/doff left sock Socks - Performed by patient: Don/doff  right sock, Don/doff left sock   Shoes - Performed by patient: Don/doff right shoe, Don/doff left shoe Shoes - Performed by helper: Fasten right, Fasten left       TED Hose - Performed by helper: Don/doff right TED hose, Don/doff left TED hose  Lower body assist Assist for lower body dressing: Touching or steadying assistance (Pt > 75%)      Toileting Toileting Toileting activity did not occur: Safety/medical concerns Toileting steps completed by patient: Adjust clothing prior to toileting, Performs perineal hygiene, Adjust clothing after toileting   Toileting Assistive Devices: Grab bar or rail  Toileting assist Assist level: Touching or steadying assistance (Pt.75%)   Transfers Chair/bed transfer Chair/bed transfer activity did not occur: Safety/medical concerns Chair/bed transfer method: Ambulatory Chair/bed transfer assist level: Moderate assist (Pt 50 - 74%/lift or lower) Chair/bed transfer assistive device: Armrests, Patent attorney     Max distance: 100 Assist level: Moderate assist (Pt 50 - 74%)   Wheelchair   Type: Manual Max wheelchair distance: 100 Assist Level: Supervision or verbal cues  Cognition Comprehension Comprehension assist level: Understands basic 75 - 89% of the time/ requires cueing 10 - 24% of the time  Expression Expression assist level: Expresses basic 90% of the time/requires cueing < 10% of the time.  Social Interaction Social Interaction assist level: Interacts appropriately 90% of the time - Needs monitoring or encouragement for participation or interaction.  Problem Solving Problem solving assist level: Solves basic 25 - 49% of the time - needs direction more than half the time to initiate, plan or complete simple activities  Memory Memory assist level: Recognizes or recalls 25 - 49% of the time/requires cueing 50 - 75% of the time   Medical Problem List and Plan: 1.  Gait abnormality, weakness, cognitive deficits secondary  to left frontoparietal infarct, lumbar spondylolisthesis/radiculopathy, lewy body dementia.  -continue CIR therapies    2.  DVT Prophylaxis/Anticoagulation:  lovenox  -dopplers normal 3. Pain Management: Tylenol prn.   -utilize heat and ice also 4. Mood: LCSW to follow for evaluation and support.  5. Neuropsych: This patient is not yet capable of making decisions on his own behalf.  -cognition waxes and wanes/sundowns 6. Skin/Wound Care: Monitor wound for healing. Routine pressure relief measures.  7. Fluids/Electrolytes/Nutrition: encourage PO  -bmet normal    8. HTN: Monitor BP bid and titrate as needed. Continue Lasix and Norvasc daily--off normadyne.  9. OSA?: Will need sleep study on outpatient basis. Encourage IS with flutter valve.  10. ABLA: continue iron supplement.   -hgb holding at 9.4 11. Possible Asp PNA v/s HAP: has completed treatment.  12. Hypokalemia: Resolved with supplementation--  13.  Protein calorie malnutrition: continue protein supplements.  14. T2DM:  Monitor ac/hs and use SSI for elevated BS. Was on metformin at home.   -sugars under control 15. Urinary retention: resolved.    -ua neg, cx neg  - pvr's 0. 16. Lewy body dementia?:  To be re-evaluated in 4-6 weeks after discharge.  17.Gout: Monitor for flares  LOS (Days) 4 A FACE TO FACE EVALUATION WAS PERFORMED  Tearra Ouk T 10/19/2015 7:18 AM

## 2015-10-19 NOTE — Progress Notes (Signed)
Social Work  Social Work Assessment and Plan  Patient Details  Name: Danny Patterson MRN: [id number] Date of Birth: 1946-01-03  Today's Date: 10/17/2015  Problem List:  Patient Active Problem List   Diagnosis Date Noted  . Osteoarthritis of spine with radiculopathy, lumbar region 10/15/2015  . Abnormal MRI   . Slurred speech   . Surgery, elective   . Loose stools   . OSA (obstructive sleep apnea)   . Acute blood loss anemia   . Pneumonia   . Urinary retention   . Lewy body dementia   . Encephalopathy   . Altered mental status   . Acute encephalopathy 10/12/2015  . Hypokalemia 10/12/2015  . Anemia 10/12/2015  . S/P lumbar fusion 10/12/2015  . Acute respiratory failure (HCC)   . Degenerative disc disease, lumbar 10/01/2015  . Sciatica of right side associated with disorder of lumbosacral spine 07/21/2015  . Hypertension 02/18/2015  . Diabetes (HCC) 02/18/2015  . Gout 02/18/2015  . Hyperlipidemia 02/18/2015  . Arthritis 02/18/2015  . Encounter to establish care 02/18/2015   Past Medical History:  Past Medical History:  Diagnosis Date  . Allergy   . Arthritis   . Diabetes mellitus without complication (HCC)   . Gout   . Hyperlipidemia   . Hypertension   . Kidney stone   . Rheumatic fever    Past Surgical History:  Past Surgical History:  Procedure Laterality Date  . BACK SURGERY    . knot     removed from neck  . POSTERIOR LUMBAR FUSION 4 LEVEL N/A 10/01/2015   Procedure: Lumbar three-four,  Lumbar four-five,  Lumbar five-Sacrum one posterior lumbar interbody fusion with Laminotomy at Lumbar Two-Three;  Surgeon: Hilda Lias, MD;  Location: [facility];  Service: Neurosurgery;  Laterality: N/A;   Social History:  reports that he has never smoked. He has never used smokeless tobacco. He reports that he drinks about 1.8 oz of alcohol per week . He reports that he does not use drugs.  Family / Support Systems Marital Status: Married Patient Roles: Spouse,  Parent Spouse/Significant Other: wife, Spyridon Hornstein @ [phone] or [phone] Children: 3 adult children:  daughter, Mirian Mo [facility]); daughter, Kevante Lunt @ [phone] and son, Killian Schwer [facility]) Anticipated Caregiver: Josiel Gahm, wife and Sylvia Kondracki, daughter Ability/Limitations of Caregiver: Elease Hashimoto has RA but gets around on her own.  Lyla Son can work from home and plans to stay with her mom and dad following his DC from [employer] Caregiver Availability: 24/7 Family Dynamics: Pt describes all family as very supportive.   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  (need to confirm this with family) Fish farm manager Issues: None Guardian/Conservator: None -per MD, pt is not yet capable of making decisions on his own behalf - defer to wife/ daughter   Abuse/Neglect Physical Abuse: Denies Verbal Abuse: Denies Sexual Abuse: Denies Exploitation of patient/patient's resources: Denies Self-Neglect: Denies  Emotional Status Pt's affect, behavior adn adjustment status: Pt appears slightly disengaged and often states he does not know answer to basic question and refers me to his wife.  Has little recall of earlier part of his hospitalization.  He denies any s/s of depression or anxiety, however, will monitor.  Will likely benefit from referral for neuropsychology  screen re: cognition.. Recent Psychosocial Issues: None Pyschiatric History: None Substance Abuse History: None  Patient / Family Perceptions, Expectations & Goals Pt/Family understanding of illness & functional limitations: Pt with very basic understanding that he had medical complications following back surgery "because evidently my body can't  handle medicines they use in surgery like other people can." Premorbid pt/family roles/activities: Pt was still actively working in [employer] PTA and independent overall. Anticipated changes in roles/activities/participation: Wife and daughter will assume primary support roles and pt with supervision goals.  Cognitive deficits are evident - return to work may be affected. Pt/family expectations/goals: "I just want to get home and back to work."  [employer]: None Premorbid Home Care/DME Agencies: None Transportation available at discharge: yes Resource referrals recommended: Neuropsychology  Discharge Planning Living Arrangements: Spouse/significant other Support Systems: Spouse/significant other, Children Type of Residence: Private residence Civil engineer, contracting: Media planner (specify) ([employer]) Financial Resources: Employment, Restaurant manager, fast food Screen Referred: No Living Expenses: Own Money Management: Patient Does the patient have any problems obtaining your medications?: No Home Management: pt and wife Patient/Family Preliminary Plans: Pt to return home with wife and daughter, Lyla Son, to provide any needed assistance. Social Work Anticipated Follow Up Needs: HH/OP Expected length of stay: 16-18 days  Clinical Impression Elderly gentleman here following back surgery with multiple medical complications occurring following this.  Good family support.  Pt having some difficulty with recall on basic subjects and refers me to speak with his wife at times.  He denies any significant emotional distress, however, cognition is poor.  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  Will follow for support and d/c planning.  Azaan Leask 10/17/2015, 11:13 AM

## 2015-10-19 NOTE — Progress Notes (Signed)
Physical Therapy Session Note  Patient Details  Name: Danny Patterson MRN: [id number] Date of Birth: 1945-12-20  Today's Date: 10/19/2015 PT Individual Time: 1303-1400 PT Individual Time Calculation (min): 57 min    Short Term Goals: Week 1:  PT Short Term Goal 1 (Week 1): Pt will perform bed mobility minA using bed features PT Short Term Goal 2 (Week 1): Pt will perform sit <>stand minA PT Short Term Goal 3 (Week 1): Pt will ambulate 150' LRAD minA PT Short Term Goal 4 (Week 1): Pt will initiate stair training PT Short Term Goal 5 (Week 1): Pt will verbalize back precautions with min cueing from therapist on three consecutive days  Skilled Therapeutic Interventions/Progress Updates:    Pt received in recliner & agreeable to treatment, noting 7/10 back pain but RN aware. Pt alert & oriented x 4 on this date but demonstrated decreased cognition during conversation. Pt able to recall 2/3 back precautions; therapist educated pt on 3/3 and "BAT" acronym to help remember them. Later during session pt only able to recall 2/3 back precautions with extra time.  Gait training x 100 ft + 90 ft + 30 ft + 30 ft + 100 ft with RW & steady A fade to close supervision. Pt with forward trunk flexion that continued even with maximum multimodal cuing to correct and adjustment of RW to proper height. Utilized cybex kinetron in sitting, up to 20 cm/sec, for BLE strengthening with pt taking rest breaks as needed. Stair training completed on 6" steps, 4 steps with B rails & steady A. Therapist provided multimodal cuing for compensatory strategy (ascend leading with RLE, descend leading with LLE) throughout activity. Pt relies heavily on BUE for stair negotiation. Performed 5x sit<>stand activity from 22 inch surface without BUE for BLE strengthening. Attempted to perform activity from 19" & 20" surface but pt very fearful of falling and unable to complete sit<>Stand transfer. Pt required cuing for anterior weight shift &  encouragement to attempt activity. Throughout session pt required cuing to push up from stable surface when transferring sit>stand. At end of session pt left sitting in recliner in room with all needs within reach & set up with meal tray.   When ambulating back to room & educated on upright posture pt stated, "Lady, when you're damn tired you can't stand up tall!".   Therapy Documentation Precautions:  Precautions Precautions: Back Required Braces or Orthoses: Spinal Brace Spinal Brace: Lumbar corset Restrictions Weight Bearing Restrictions: No  Pain: Pain Assessment Pain Assessment: 0-10 Pain Score: 7  Pain Location: Back Pain Intervention(s): RN made aware;Ambulation/increased activity   See Function Navigator for Current Functional Status.   Therapy/Group: Individual Therapy  Sandi Mariscal 10/19/2015, 1:26 PM

## 2015-10-20 ENCOUNTER — Inpatient Hospital Stay (HOSPITAL_COMMUNITY): Payer: PPO | Admitting: Speech Pathology

## 2015-10-20 ENCOUNTER — Inpatient Hospital Stay (HOSPITAL_COMMUNITY): Payer: PPO | Admitting: Occupational Therapy

## 2015-10-20 ENCOUNTER — Inpatient Hospital Stay (HOSPITAL_COMMUNITY): Payer: PPO | Admitting: Physical Therapy

## 2015-10-20 DIAGNOSIS — M4726 Other spondylosis with radiculopathy, lumbar region: Secondary | ICD-10-CM | POA: Diagnosis not present

## 2015-10-20 LAB — BASIC METABOLIC PANEL
ANION GAP: 9 (ref 5–15)
BUN: 22 mg/dL — AB (ref 6–20)
CALCIUM: 9.2 mg/dL (ref 8.9–10.3)
CO2: 26 mmol/L (ref 22–32)
CREATININE: 1.11 mg/dL (ref 0.61–1.24)
Chloride: 106 mmol/L (ref 101–111)
GFR calc Af Amer: >60 mL/min (ref >60)
GLUCOSE: 196 mg/dL — AB (ref 65–99)
Potassium: 4 mmol/L (ref 3.5–5.1)
Sodium: 141 mmol/L (ref 135–145)

## 2015-10-20 LAB — CBC
HCT: 28.8 % — ABNORMAL LOW (ref 39.0–52.0)
HEMOGLOBIN: 9.6 g/dL — AB (ref 13.0–17.0)
MCH: 29.9 pg (ref 26.0–34.0)
MCHC: 33.3 g/dL (ref 30.0–36.0)
MCV: 89.7 fL (ref 78.0–100.0)
PLATELETS: 481 10*3/uL — AB (ref 150–400)
RBC: 3.21 MIL/uL — AB (ref 4.22–5.81)
RDW: 14.1 % (ref 11.5–15.5)
WBC: 9.1 10*3/uL (ref 4.0–10.5)

## 2015-10-20 LAB — GLUCOSE, CAPILLARY
GLUCOSE-CAPILLARY: 142 mg/dL — AB (ref 65–99)
Glucose-Capillary: 102 mg/dL — ABNORMAL HIGH (ref 65–99)
Glucose-Capillary: 116 mg/dL — ABNORMAL HIGH (ref 65–99)
Glucose-Capillary: 127 mg/dL — ABNORMAL HIGH (ref 65–99)

## 2015-10-20 NOTE — Progress Notes (Signed)
Speech Language Pathology Daily Session Note  Patient Details  Name: Danny Patterson MRN: [id number] Date of Birth: [birth year]  Today's Date: 10/20/2015  Session 1 SLP Individual Time: 1100-1130 SLP Individual Time Calculation (min): 30 min  Session 2 SLP Individual Time: 1400-1430 SLP Individual Time Calculation (min): 30 min    Short Term Goals: Week 1: SLP Short Term Goal 1 (Week 1): Patient will utilize external memory aids to recall new, daily information with Mod  A verbal and visual cues.  SLP Short Term Goal 2 (Week 1): Patient will demonstrate functional problem solving for basic and familiar tasks with Mod A verbal and visual cues.  SLP Short Term Goal 3 (Week 1): Patient will identify 2 cognitive deficits with Mod A quesiton cues.  SLP Short Term Goal 4 (Week 1): Patient will utilize call bell to request assistance in 50% of observable opportunities with Mod A question cues.   Skilled Therapeutic Interventions:   Session 1 Skilled treatment session focused on addressing cognition goals. Patient participated in a basic money management task with Mod faded to Min verbal cues to recognize and correct errors related to counting denominations.  Patient required multiple repetitions due to working memory deficits.  SLP also facilitated session by providing Max assist multi modal cues to accurately make change.  Patient with some remote awareness of difficulty with task reporting to SLP that this is frustrating to him and he requested to move on. Continue with current plan of care.    Session 2 Skilled treatment session focused on addressing cognition goals. SLP facilitated session by providing a written deductive reasoning task.  Patient required Max assist multimodal cues to utilize look back as a recall compensatory strategy to accurately complete task.  Continue with current plan of care.   Function:  Cognition Comprehension Comprehension assist level: Understands basic 75 - 89% of  the time/ requires cueing 10 - 24% of the time  Expression   Expression assist level: Expresses basic needs/ideas: With extra time/assistive device  Social Interaction Social Interaction assist level: Interacts appropriately 90% of the time - Needs monitoring or encouragement for participation or interaction.  Problem Solving Problem solving assist level: Solves basic 25 - 49% of the time - needs direction more than half the time to initiate, plan or complete simple activities  Memory Memory assist level: Recognizes or recalls 25 - 49% of the time/requires cueing 50 - 75% of the time    Pain Pain Assessment Pain Assessment: No/denies pain x2  Therapy/Group: Individual Therapy x2  Fae Pippin, M.A., CCC-SLP [phone]  Fontella Shan 10/20/2015, 12:05 PM

## 2015-10-20 NOTE — Progress Notes (Signed)
Physical Therapy Session Note  Patient Details  Name: Danny Patterson MRN: [id number] Date of Birth: 09/24/1945  Today's Date: 10/20/2015 PT Individual Time: 0900-1015 PT Individual Time Calculation (min): 75 min    Short Term Goals: Week 1:  PT Short Term Goal 1 (Week 1): Pt will perform bed mobility minA using bed features PT Short Term Goal 2 (Week 1): Pt will perform sit <>stand minA PT Short Term Goal 3 (Week 1): Pt will ambulate 150' LRAD minA PT Short Term Goal 4 (Week 1): Pt will initiate stair training PT Short Term Goal 5 (Week 1): Pt will verbalize back precautions with min cueing from therapist on three consecutive days  Skilled Therapeutic Interventions/Progress Updates:   Pt received seated in w/c, denies pain and agreeable to treatment. Pt dons shoes in sitting with S; increased time and pt frustrated by difficulty getting non-skid socks into shoes. Therapist attempted to get pt to change into regular socks, and pt again frustrated by rules and confusion regarding non-skid vs regular socks; educated pt on requirements regarding non-skid socks if pt does not have shoes on, but he is able to wear regular socks if he is going to wear shoes. "I'm not going to argue with you over a pair of socks.. Someone will just come in later and get mad at me for something different."; pt is able to eventually don shoes with non-skid socks after loosening elastic shoe laces.Gait to gym with RW and S x150'; forward flexed posture and bent knees.  Standing activity tolerance with BUE reaching to match cards to board; performed to facilitate upright posture. Alternating toe taps to 4" step with BUE support on RW initially, reduced to 1 UE support on RW with increased pt hesitancy and fear of falling. Pt educated on challenging balance to improve safety and reduce falls risk, and that all activities are graded for gradual increase in demand without unnecessary risk of falling. Stairs 1x8 with B handrails, 6"  height. Pt reports he will have railings installed at home; therapist attempted to determine a timeline for installation to assess readiness for going home; pt reports it will not be done until he gets home because he cannot manage setting up a contractor to come to the house, and does not trust anyone to do it without him there. Attempted to educate pt on importance of safely entering/exiting home and reduce falls risk, pt becomes frustrated with therapist stating "You don't stick your nose in my finances". Therapist able to redirect pt and educate regarding plan of continuing to strengthen LEs and challenge balance with reducing UE reliance to prepare for using stairs at home as rails will not be present when he first gets home; pt agreeable. Two more trials x8 stairs each with min guard faded to S. Gait to return to room with S and RW x150'. Remained seated in recliner with all needs in reach; w/c cushion placed in recliner to improve ease of sit <>stand.  Therapy Documentation Precautions:  Precautions Precautions: Back Precaution Comments: Pt unable to recall any precautions during first session of the day; "My doctor told me I can do just about whatever I want as long as I know my limits". Educated and repeated back, unable to recall 5 min later. Required Braces or Orthoses: Spinal Brace Spinal Brace: Lumbar corset Restrictions Weight Bearing Restrictions: No Pain: Pain Assessment Pain Assessment: No/denies pain   See Function Navigator for Current Functional Status.   Therapy/Group: Individual Therapy  Vista Lawman  10/20/2015, 10:13 AM

## 2015-10-20 NOTE — Progress Notes (Signed)
Occupational Therapy Session Note  Patient Details  Name: Danny Patterson MRN: [id number] Date of Birth: [birth year]  Today's Date: 10/20/2015 OT Individual Time: 9935-7017 OT Individual Time Calculation (min): 75 min    Short Term Goals:Week 1:  OT Short Term Goal 1 (Week 1): Pt will recall proper use for AE during LB dressing with 3 or less VCs OT Short Term Goal 2 (Week 1): Pt will complete toileting task with supervision OT Short Term Goal 3 (Week 1): Pt will complete2 grooming tasks standing at sink with supervision in order to increase independence with ADLs OT Short Term Goal 4 (Week 1): Pt will recall 3/3 spinal precautions 3 consecutive days with less than1 VC each day  Skilled Therapeutic Interventions/Progress Updates:    Pt seen for skilled OT session focusing on self care. Pt sitting in w/c and agreeable to tx session upon arrival. Pt required min A sit to stand throughout session and ambulated throughout using RW and close supervision. Pt completed shower on Tub Transfer Bench in walk-in shower which OT recommends for home (continue to educate on importance for safety because pt not open to bench/chair at this time). OT educated pt on performing lateral leans to wash buttocks which pt was able to do, but says he will not continue to do at home. Therefore, grab bars are recommended due to decreased strength sit to stand and balance. OT educated on advantages and types of grab bars.   Pt was able to donn/doff back brace with min VC, but was unable to remember back precautions even after cueing. However, he does state he reads back precaution list every morning. Pt required total A donning compression socks and attempted to donn shoes but with increased frustration pt decided to wear non slip socks and use sock aide. Pt required increased time and encouragement from OT to donn pants with reacher without breaking back precautions.  Pt left in w/c with all needs met.   Therapy  Documentation Precautions:  Precautions Precautions: Back Precaution Comments: Pt unable to recall any precautions during first session of the day; "My doctor told me I can do just about whatever I want as long as I know my limits". Educated and repeated back, unable to recall 5 min later. Required Braces or Orthoses: Spinal Brace Spinal Brace: Lumbar corset Restrictions Weight Bearing Restrictions: No   Pain: Pain Assessment Pain Assessment: No/denies pain  See Function Navigator for Current Functional Status.   Therapy/Group: Individual Therapy  Matilde Bash 10/20/2015, 10:14 AM

## 2015-10-20 NOTE — Progress Notes (Signed)
[place] PHYSICAL MEDICINE & REHABILITATION     PROGRESS NOTE    Subjective/Complaints: Up eating breakfast. Denies pain this am. Asked him if he recalled our conversation yesterday morning and he did not.   ROS: limited due to cognition    Objective: Vital Signs: Blood pressure (!) 152/90, pulse 97, temperature 97.8 F (36.6 C), temperature source Oral, resp. rate 18, height 5\' 10"  (1.778 m), weight 100 kg (220 lb 7.4 oz), SpO2 97 %. No results found.  Recent Labs  10/20/15 0642  WBC 9.1  HGB 9.6*  HCT 28.8*  PLT 481*   No results for input(s): NA, K, CL, GLUCOSE, BUN, CREATININE, CALCIUM in the last 72 hours.  Invalid input(s): CO CBG (last 3)   Recent Labs  10/19/15 1642 10/19/15 2138 10/20/15 0630  GLUCAP 135* 167* 102*    Wt Readings from Last 3 Encounters:  10/15/15 100 kg (220 lb 7.4 oz)  10/10/15 105 kg (231 lb 7.7 oz)  09/24/15 111.4 kg (245 lb 9.5 oz)    Physical Exam:  Constitutional: He is oriented to person, place, and time. He appears well-developed and well-nourished.  HENT:  Head: Normocephalic and atraumatic.  Mouth/Throat: Oropharynx is clear and moist.  Eyes: Conjunctivae are normal. Pupils are equal, round, and reactive to light.  Neck: Normal range of motion. Neck supple.  Cardiovascular: Normal rate and regular rhythm.   Respiratory: Effort normal. He has rhonchi. He exhibits no tenderness.  GI: Soft. Bowel sounds are normal. He exhibits no distension. There is no tenderness.  Musculoskeletal: He exhibits edema. He exhibits no tenderness.  Neurological: He is alert and oriented to person, place, and time.  Left facial weakness. Mild dysarthria.  Able to answer orientation questions. Recalls events from yesterday. Appropriate conversation. Decreased sensation LLE Motor: B/l UE 4+5 proximal to distal RLE: 4/5 proximal to distal LLE: 4-/5 proximal to distal  Skin: Skin is warm and dry. No rash noted. No erythema.  IJ site intact.  Back wound dry   Psychiatric: He has a normal mood and affect. His speech is clear. Cognition   impaired. Confused and agitated this am  Assessment/Plan: 1. 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 Physiatrist is providing close team supervision and 24 hour management of active medical problems listed below. Physiatrist and rehab team continue to assess barriers to discharge/monitor patient progress toward functional and medical goals.  Function:  Bathing Bathing position   Position: Wheelchair/chair at sink  Bathing parts Body parts bathed by patient: Right arm, Left arm, Chest, Abdomen, Front perineal area, Right upper leg, Left upper leg, Right lower leg, Left lower leg Body parts bathed by helper: Back, Buttocks  Bathing assist Assist Level: Touching or steadying assistance(Pt > 75%)      Upper Body Dressing/Undressing Upper body dressing   What is the patient wearing?: Pull over shirt/dress, Orthosis     Pull over shirt/dress - Perfomed by patient: Thread/unthread right sleeve, Thread/unthread left sleeve, Put head through opening, Pull shirt over trunk       Orthosis activity level: Performed by patient  Upper body assist Assist Level: Set up, Supervision or verbal cues   Set up : To obtain clothing/put away  Lower Body Dressing/Undressing Lower body dressing   What is the patient wearing?: Pants, Non-skid slipper socks, Ted Hose     Pants- Performed by patient: Thread/unthread right pants  leg, Thread/unthread left pants leg, Pull pants up/down Pants- Performed by helper: Pull pants up/down Non-skid slipper socks- Performed by patient: Don/doff right sock, Don/doff left sock Non-skid slipper socks- Performed by helper: Don/doff right sock, Don/doff left sock Socks - Performed by  patient: Don/doff right sock, Don/doff left sock   Shoes - Performed by patient: Don/doff right shoe, Don/doff left shoe Shoes - Performed by helper: Fasten right, Fasten left       TED Hose - Performed by helper: Don/doff right TED hose, Don/doff left TED hose  Lower body assist Assist for lower body dressing: Touching or steadying assistance (Pt > 75%)      Toileting Toileting Toileting activity did not occur: Safety/medical concerns Toileting steps completed by patient: Performs perineal hygiene Toileting steps completed by helper: Adjust clothing prior to toileting, Adjust clothing after toileting Toileting Assistive Devices: Grab bar or rail  Toileting assist Assist level: Touching or steadying assistance (Pt.75%)   Transfers Chair/bed transfer Chair/bed transfer activity did not occur: Safety/medical concerns Chair/bed transfer method: Ambulatory Chair/bed transfer assist level: Moderate assist (Pt 50 - 74%/lift or lower) Chair/bed transfer assistive device: Armrests, Patent attorney     Max distance: 100 ft Assist level: Supervision or verbal cues   Wheelchair   Type: Manual Max wheelchair distance: 100 Assist Level: Supervision or verbal cues  Cognition Comprehension Comprehension assist level: Understands basic 75 - 89% of the time/ requires cueing 10 - 24% of the time  Expression Expression assist level: Expresses basic 90% of the time/requires cueing < 10% of the time.  Social Interaction Social Interaction assist level: Interacts appropriately 75 - 89% of the time - Needs redirection for appropriate language or to initiate interaction.  Problem Solving Problem solving assist level: Solves basic 75 - 89% of the time/requires cueing 10 - 24% of the time  Memory Memory assist level: Recognizes or recalls 50 - 74% of the time/requires cueing 25 - 49% of the time   Medical Problem List and Plan: 1.  Gait abnormality, weakness, cognitive deficits  secondary to left frontoparietal infarct, lumbar spondylolisthesis/radiculopathy, lewy body dementia.  -continue CIR therapies    2.  DVT Prophylaxis/Anticoagulation:  lovenox  -dopplers normal 3. Pain Management: Tylenol prn.   -utilize heat and ice also  -discussed with him the fact that we have to limit meds to an extent given cognitive status 4. Mood: LCSW to follow for evaluation and support.  5. Neuropsych: This patient is not yet capable of making decisions on his own behalf.  -cognition waxes and wanes/sundowns 6. Skin/Wound Care: Monitor wound for healing. Routine pressure relief measures.  7. Fluids/Electrolytes/Nutrition: encourage PO  -bmet normal    8. HTN: Monitor BP bid and titrate as needed. Continue Lasix and Norvasc daily--off normadyne.  9. OSA?: Will need sleep study on outpatient basis. Encourage IS with flutter valve.  10. ABLA: continue iron supplement.   -hgb holding at 9.6 11. Possible Asp PNA v/s HAP: has completed treatment.  12. Hypokalemia: Resolved with supplementation--  13.  Protein calorie malnutrition: continue protein supplements.  14. T2DM:  Monitor ac/hs and use SSI for elevated BS. Was on metformin at home.   -sugars reviewed and remain under control 15. Urinary retention: resolved.    -ua neg, cx neg  - pvr's 0. 16. Lewy body dementia?:  To be re-evaluated in 4-6 weeks after discharge.   -episodic confusion and agitation 17.Gout: Monitor for flares  LOS (Days) 5 A FACE TO  FACE EVALUATION WAS PERFORMED  Diannah Rindfleisch T 10/20/2015 8:13 AM

## 2015-10-21 ENCOUNTER — Inpatient Hospital Stay (HOSPITAL_COMMUNITY): Payer: PPO | Admitting: Occupational Therapy

## 2015-10-21 ENCOUNTER — Inpatient Hospital Stay (HOSPITAL_COMMUNITY): Payer: PPO | Admitting: Speech Pathology

## 2015-10-21 ENCOUNTER — Inpatient Hospital Stay (HOSPITAL_COMMUNITY): Payer: PPO | Admitting: *Deleted

## 2015-10-21 ENCOUNTER — Inpatient Hospital Stay (HOSPITAL_COMMUNITY): Payer: PPO | Admitting: Physical Therapy

## 2015-10-21 DIAGNOSIS — M4726 Other spondylosis with radiculopathy, lumbar region: Secondary | ICD-10-CM | POA: Diagnosis not present

## 2015-10-21 LAB — GLUCOSE, CAPILLARY
GLUCOSE-CAPILLARY: 96 mg/dL (ref 65–99)
GLUCOSE-CAPILLARY: 126 mg/dL — AB (ref 65–99)
GLUCOSE-CAPILLARY: 103 mg/dL — AB (ref 65–99)
Glucose-Capillary: 148 mg/dL — ABNORMAL HIGH (ref 65–99)

## 2015-10-21 MED ORDER — ACETAMINOPHEN 500 MG PO TABS
500.0000 mg | ORAL_TABLET | Freq: Three times a day (TID) | ORAL | Status: DC
  Administered 2015-10-21 – 2015-10-23 (×6): 500 mg via ORAL
  Filled 2015-10-21 (×7): qty 1

## 2015-10-21 MED ORDER — ACETAMINOPHEN 325 MG PO TABS
325.0000 mg | ORAL_TABLET | Freq: Four times a day (QID) | ORAL | Status: DC | PRN
  Administered 2015-10-22 – 2015-10-23 (×2): 650 mg via ORAL
  Filled 2015-10-21 (×2): qty 2

## 2015-10-21 MED ORDER — FUROSEMIDE 40 MG PO TABS
40.0000 mg | ORAL_TABLET | Freq: Every day | ORAL | Status: DC
  Administered 2015-10-21 – 2015-10-23 (×3): 40 mg via ORAL
  Filled 2015-10-21 (×3): qty 1

## 2015-10-21 NOTE — Progress Notes (Signed)
[name] PHYSICAL MEDICINE & REHABILITATION     PROGRESS NOTE    Subjective/Complaints: Lying in bed. Irritable. States legs are killing him. Pain is in thighs. Not his gout pain. States we're not doing anything fo rhis pain   ROS: limited due to cognition and behavior    Objective: Vital Signs: Blood pressure (!) 155/87, pulse 91, temperature 97.9 F (36.6 C), temperature source Oral, resp. rate 17, height  (1.778 m), weight 100 kg (220 lb 7.4 oz), SpO2 96 %. No results found.  Recent Labs  10/20/15 0642  WBC 9.1  HGB 9.6*  HCT 28.8*  PLT 481*    Recent Labs  10/20/15 1353  NA 141  K 4.0  CL 106  GLUCOSE 196*  BUN 22*  CREATININE 1.11  CALCIUM 9.2   CBG (last 3)   Recent Labs  10/20/15 1627 10/20/15 2039 10/21/15 0631  GLUCAP 127* 142* 96    Wt Readings from Last 3 Encounters:  10/15/15 100 kg (220 lb 7.4 oz)  10/10/15 105 kg (231 lb 7.7 oz)  09/24/15 111.4 kg (245 lb 9.5 oz)    Physical Exam:  Constitutional: He is oriented to person, place, and time. He appears well-developed and well-nourished.  HENT:  Head: Normocephalic and atraumatic.  Mouth/Throat: Oropharynx is clear and moist.  Eyes: Conjunctivae are normal. Pupils are equal, round, and reactive to light.  Neck: Normal range of motion. Neck supple.  Cardiovascular: Normal rate and regular rhythm.   Respiratory: Effort normal. He has rhonchi. He exhibits no tenderness.  GI: Soft. Bowel sounds are normal. He exhibits no distension. There is no tenderness.  Musculoskeletal: He exhibits edema R>L LE. Marland Kitchen He exhibits subjective tenderness to palpation in quadriceps area bilaterally..  Neurological: He is alert and oriented to person, place, and time.  Left facial weakness. Mild dysarthria.  Able to answer orientation questions. Recalls events from yesterday. Appropriate conversation. Decreased sensation LLE Motor: B/l UE 4+5 proximal to distal RLE: 4/5 proximal to distal LLE: 4-/5  proximal to distal  Skin: Skin is warm and dry. No rash noted. No erythema.    Back wound dry and well approximated Psychiatric: He has a normal mood and affect. His speech is clear. Cognition   impaired. Confused and agitated this am  Assessment/Plan: 1. 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 Physiatrist is providing close team supervision and 24 hour management of active medical problems listed below. Physiatrist and rehab team continue to assess barriers to discharge/monitor patient progress toward functional and medical goals.  Function:  Bathing Bathing position   Position: Shower  Bathing parts Body parts bathed by patient: Right arm, Left arm, Chest, Abdomen, Front perineal area, Right upper leg, Left upper leg, Right lower leg, Left lower leg, Buttocks, Back Body parts bathed by helper: Back, Buttocks  Bathing assist Assist Level: Supervision or verbal cues      Upper Body Dressing/Undressing Upper body dressing   What is the patient wearing?: Pull over shirt/dress, Orthosis     Pull over shirt/dress - Perfomed by patient: Thread/unthread right sleeve, Thread/unthread left sleeve, Put head through opening, Pull shirt over trunk       Orthosis activity level: Performed by patient  Upper body assist Assist Level: Set up, Supervision or verbal cues   Set up : To obtain clothing/put away  Lower Body Dressing/Undressing Lower body dressing  What is the patient wearing?: Pants, Non-skid slipper socks, Ted Hose     Pants- Performed by patient: Thread/unthread right pants leg, Thread/unthread left pants leg, Pull pants up/down Pants- Performed by helper: Pull pants up/down Non-skid slipper socks- Performed by patient: Don/doff right sock, Don/doff left sock Non-skid slipper socks-  Performed by helper: Don/doff right sock, Don/doff left sock Socks - Performed by patient: Don/doff right sock, Don/doff left sock   Shoes - Performed by patient: Don/doff right shoe, Don/doff left shoe Shoes - Performed by helper: Fasten right, Fasten left       TED Hose - Performed by helper: Don/doff right TED hose, Don/doff left TED hose  Lower body assist Assist for lower body dressing: Touching or steadying assistance (Pt > 75%)      Toileting Toileting   Toileting steps completed by patient: Performs perineal hygiene Toileting steps completed by helper: Adjust clothing prior to toileting, Adjust clothing after toileting Toileting Assistive Devices: Grab bar or rail  Toileting assist Assist level: Touching or steadying assistance (Pt.75%)   Transfers Chair/bed transfer   Chair/bed transfer method: Ambulatory Chair/bed transfer assist level: Touching or steadying assistance (Pt > 75%) Chair/bed transfer assistive device: Armrests, Patent attorney     Max distance: 150 Assist level: Supervision or verbal cues   Wheelchair   Type: Manual Max wheelchair distance: 100 Assist Level: Supervision or verbal cues  Cognition Comprehension Comprehension assist level: Understands basic 75 - 89% of the time/ requires cueing 10 - 24% of the time  Expression Expression assist level: Expresses basic 75 - 89% of the time/requires cueing 10 - 24% of the time. Needs helper to occlude trach/needs to repeat words.  Social Interaction Social Interaction assist level: Interacts appropriately 75 - 89% of the time - Needs redirection for appropriate language or to initiate interaction.  Problem Solving Problem solving assist level: Solves basic 75 - 89% of the time/requires cueing 10 - 24% of the time  Memory Memory assist level: Recognizes or recalls 50 - 74% of the time/requires cueing 25 - 49% of the time   Medical Problem List and Plan: 1.  Gait abnormality, weakness,  cognitive deficits secondary to left frontoparietal infarct, lumbar spondylolisthesis/radiculopathy, lewy body dementia.  -team conf today    2.  DVT Prophylaxis/Anticoagulation:  lovenox  -dopplers normal 3. Pain Management: Tylenol prn.   - heat and ice also  -trying to be conservative with medications due to dementia  -will schedule tylenol q8 4. Mood: LCSW to follow for evaluation and support.  5. Neuropsych: This patient is not yet capable of making decisions on his own behalf.  -cognition waxes and wanes/sundowns 6. Skin/Wound Care: Monitor wound for healing. Routine pressure relief measures.  7. Fluids/Electrolytes/Nutrition: encourage PO  -bmet normal yesterday    8. HTN: Monitor BP bid and titrate as needed. Continue Lasix and Norvasc daily--off normadyne.  -will increase lasix to 40mg  qd to help with LE edema---watch BUN 9. OSA?: Will need sleep study on outpatient basis. Encourage IS with flutter valve.  10. ABLA: continue iron supplement.   -hgb holding at 9.6 11. Possible Asp PNA v/s HAP: has completed treatment.  12. Hypokalemia: Resolved with supplementation--  13.  Protein calorie malnutrition: continue protein supplements.  14. T2DM:  Monitor ac/hs and use SSI for elevated BS. Was on metformin at home.   -sugars reviewed and remain under control 15. Urinary retention: resolved.    -ua neg, cx neg  - pvr's 0.  16. Lewy body dementia?:  To be re-evaluated in 4-6 weeks after discharge.   -episodic confusion and agitation 17.Gout: Monitor for flares--none at present  LOS (Days) 6 A FACE TO FACE EVALUATION WAS PERFORMED  Danny Patterson 10/21/2015 7:46 AM

## 2015-10-21 NOTE — Progress Notes (Signed)
Occupational Therapy Session Note  Patient Details  Name: Danny Patterson MRN: [id number] Date of Birth: 04-08-1945  Today's Date: 10/21/2015 OT Individual Time: 9977-4142 and 1300-1330 OT Individual Time Calculation (min): 75 min and 30 min     Short Term Goals:Week 1:  OT Short Term Goal 1 (Week 1): Pt will recall proper use for AE during LB dressing with 3 or less VCs OT Short Term Goal 2 (Week 1): Pt will complete toileting task with supervision OT Short Term Goal 3 (Week 1): Pt will complete2 grooming tasks standing at sink with supervision in order to increase independence with ADLs OT Short Term Goal 4 (Week 1): Pt will recall 3/3 spinal precautions 3 consecutive days with less than1 VC each day  Skilled Therapeutic Interventions/Progress Updates:    Session One: Pt seen for OT session focusing on functional mobility, functional transfers, and standing balance. Pt sitting EOB upon arrival finishing breakfast. He declined full bathing/showering task this session stating "lets just go to the gym and get this over with." Re-educated regarding role and purpose of OT. Discussed with pt need to practice shower transfer method for home as pt has both tub/shower combo and walk in shower. Pt initially refusing practicing stating "i'll figure all this out when I get home, you don't understand me or my home in order to help me." Cont education and role of IPR and home health therapy. He completed grooming tasks standing at sink with supervision, demonstrating improvied functional standing balance, able to use B UEs while standing to complete task.  He ambulated to therapy gym using RW and supervision with VCs for upright posture and maintain close distance to RW. In gym, pt receptive to simulated shower stall transfer. Educated regarding possible DME for showering. Recommending BSC in shower as it has arm rests to assist with pt sit > stand. Pt completed shower stall transfer using RW with close  supervision following demonstration of technique. Educated regarding sequencing of shower transfer then removing TLSO with pt not understanding why he needed to have TLSOon when entering/exiting shower. Will cont to benefit from repeated education of transfer techniques, spinal precautions, and sequencing of tasks with TLSO.  He completed functional reaching tasking standing on non- compliant  Surface with RW and steadying assist.  He then ambulated to ADL apartment and completed functional task in kitchen removing and replacing items from over head cabinet. He declined wanting to practice transfers from low soft surface couch as he doesnt not believe he will be able to stand from low surface. Discussed with pt lifting restrictions with back precautions, to lift no more than 5 pounds. Pt with no recall of this information and questioning how he will maintain work schedule with lifting restrictions, saying "well that restrictions just not going to work with my schedule".  Pt returned to room at end of session, left sitting in chair with all needs in reach. Re-educated regarding use of call bell for assist with mobility.   Session Two: Pt seen for OT session focusing on functional mobility/ activity tolerance and IADL re-training. Pt asleep in supine upon arrival, easily awoken and agreeable to tx session. He transferred to EOB and with increased time for problem solving pt able to don TLSO.  He ambulated throughout unit with RW to therapy day room where he watered plants. Pt required VCs throughout task for positioning of RW during functional tasks. Following seated rest break, pt ambulated to ADL apartment and completed simulated simple meal prep task  making cup of coffee, requiring max A VCs for RW management, becoming frustrated with therapist when cuing to stay inside RW.  Pt returned to room at end of session, left sitting in chair with all needs in reach.    Therapy Documentation Precautions:   Precautions Precautions: Back Precaution Comments: Pt unable to recall any precautions during first session of the day; "My doctor told me I can do just about whatever I want as long as I know my limits". Educated and repeated back, unable to recall 5 min later. Required Braces or Orthoses: Spinal Brace Spinal Brace: Lumbar corset Restrictions Weight Bearing Restrictions: No Pain: Pain Assessment Pain Score: Asleep  See Function Navigator for Current Functional Status.   Therapy/Group: Individual Therapy  Lewis, Jovontae Banko C 10/21/2015, 7:07 AM

## 2015-10-21 NOTE — Plan of Care (Signed)
Problem: RH SAFETY Goal: RH STG ADHERE TO SAFETY PRECAUTIONS W/ASSISTANCE/DEVICE STG Adhere to Safety Precautions With mod Assistance/Device.   Outcome: Not Progressing Pt not following safety precautions

## 2015-10-21 NOTE — Plan of Care (Signed)
Problem: RH PAIN MANAGEMENT Goal: RH STG PAIN MANAGED AT OR BELOW PT'S PAIN GOAL <3  Outcome: Not Progressing Pt consistently rating high levels of pain after medication.

## 2015-10-21 NOTE — Progress Notes (Signed)
Speech Language Pathology Daily Session Note  Patient Details  Name: Danny Patterson MRN: [id number] Date of Birth: [birth year]  Today's Date: 10/21/2015 SLP Individual Time: 1120-1200 SLP Individual Time Calculation (min): 40 min   Short Term Goals: Week 1: SLP Short Term Goal 1 (Week 1): Patient will utilize external memory aids to recall new, daily information with Mod  A verbal and visual cues.  SLP Short Term Goal 2 (Week 1): Patient will demonstrate functional problem solving for basic and familiar tasks with Mod A verbal and visual cues.  SLP Short Term Goal 3 (Week 1): Patient will identify 2 cognitive deficits with Mod A quesiton cues.  SLP Short Term Goal 4 (Week 1): Patient will utilize call bell to request assistance in 50% of observable opportunities with Mod A question cues.   Skilled Therapeutic Interventions:   Skilled treatment session focused on addressing cognition goals. When cognitive goals are directly targeted patient becomes verbally frustrated; as a result, SLP facilitated session with a discussion regarding current deficits.  Patient acknowledges physical deficits but denies any cognitive changes and requires Total assist to identify decreased memory and it's impact on his safety.  Patient requires Mod assist verbal cues to recall back precautions posted in room.  Patient able to recall current medications with Mod assist multimodal cues and stated that wife managed them prior to admission.  Continue with current plan of care.   Function:  Cognition Comprehension Comprehension assist level: Understands basic 90% of the time/cues < 10% of the time  Expression   Expression assist level: Expresses basic needs/ideas: With extra time/assistive device  Social Interaction Social Interaction assist level: Interacts appropriately 90% of the time - Needs monitoring or encouragement for participation or interaction.  Problem Solving Problem solving assist level: Solves basic 50 -  74% of the time/requires cueing 25 - 49% of the time  Memory Memory assist level: Recognizes or recalls 50 - 74% of the time/requires cueing 25 - 49% of the time    Pain Pain Assessment Pain Assessment: 0-10 Pain Score: 8  Pain Type: Surgical pain Pain Location: Back Pain Orientation: Lower Pain Descriptors / Indicators: Aching Patients Stated Pain Goal: 2 Pain Intervention(s): RN made aware Multiple Pain Sites: No  Therapy/Group: Individual Therapy  Charlane Ferretti., CCC-SLP [phone]  Danny Patterson 10/21/2015, 12:21 PM

## 2015-10-21 NOTE — Progress Notes (Signed)
Recreational Therapy Session Note  Patient Details  Name: Danny Patterson MRN: [id number] Date of Birth: 07/03/1945 Today's Date: 10/21/2015  Pain: no c/o Skilled Therapeutic Interventions/Progress Updates: Met with pt to discuss TR services and use of leisure time post discharge with potential modifications. Pt stated intent to return to work related activities as tolerated even when reminded of medical precautions/parameters.  Pt is anxious to return home and feels he will recover better once there.  He is anxious to hear from the SW this afternoon about team reccommedations.  No further TR as pt is not interested in this service while on CIR.   Danny Patterson 10/21/2015, 11:48 AM

## 2015-10-21 NOTE — Progress Notes (Signed)
Physical Therapy Session Note  Patient Details  Name: Danny Patterson MRN: [id number] Date of Birth: [birth year]  Today's Date: 10/21/2015 PT Individual Time: 1173-5670 PT Individual Time Calculation (min): 60 min    Short Term Goals: Week 1:  PT Short Term Goal 1 (Week 1): Pt will perform bed mobility minA using bed features PT Short Term Goal 2 (Week 1): Pt will perform sit <>stand minA PT Short Term Goal 3 (Week 1): Pt will ambulate 150' LRAD minA PT Short Term Goal 4 (Week 1): Pt will initiate stair training PT Short Term Goal 5 (Week 1): Pt will verbalize back precautions with min cueing from therapist on three consecutive days  Skilled Therapeutic Interventions/Progress Updates:   Pt received seated in straight back chair, denies pain and agreeable to treatment. Gait to gym with close S and RW x160'. Sit <>stand from mat table with BUE assist and S to stand with RW on balance foam; static standing with dynamic UE activity while engaged in pipe tree. Standing tolerance of 3-4 min at a time before requiring seated rest break. Obstacle course x2 trials including stepping over objects, around obstacles, 3" curb step, uneven surface all with RW and S, min cues for problem solving navigating obstacles for safest method. Stairs x4 stairs at a time with B handrails, variable step-to and reciprocal pattern and S, 3 trials total with rest breaks between d/t fatigue. Returned to room with gait using RW and S. Remained seated in straight back chair with all needs in reach at end of session.   Therapy Documentation Precautions:  Precautions Precautions: Back Precaution Comments: Pt unable to recall any precautions during first session of the day; "My doctor told me I can do just about whatever I want as long as I know my limits". Educated and repeated back, unable to recall 5 min later. Required Braces or Orthoses: Spinal Brace Spinal Brace: Lumbar corset Restrictions Weight Bearing Restrictions:  No   See Function Navigator for Current Functional Status.   Therapy/Group: Individual Therapy  Vista Lawman 10/21/2015, 10:35 AM

## 2015-10-22 ENCOUNTER — Ambulatory Visit: Payer: PPO | Admitting: Cardiology

## 2015-10-22 ENCOUNTER — Inpatient Hospital Stay (HOSPITAL_COMMUNITY): Payer: PPO | Admitting: Speech Pathology

## 2015-10-22 ENCOUNTER — Inpatient Hospital Stay (HOSPITAL_COMMUNITY): Payer: PPO | Admitting: Occupational Therapy

## 2015-10-22 ENCOUNTER — Inpatient Hospital Stay (HOSPITAL_COMMUNITY): Payer: PPO | Admitting: Physical Therapy

## 2015-10-22 DIAGNOSIS — M4726 Other spondylosis with radiculopathy, lumbar region: Secondary | ICD-10-CM | POA: Diagnosis not present

## 2015-10-22 LAB — GLUCOSE, CAPILLARY
GLUCOSE-CAPILLARY: 108 mg/dL — AB (ref 65–99)
GLUCOSE-CAPILLARY: 154 mg/dL — AB (ref 65–99)
Glucose-Capillary: 95 mg/dL (ref 65–99)
Glucose-Capillary: 117 mg/dL — ABNORMAL HIGH (ref 65–99)

## 2015-10-22 MED ORDER — METHOCARBAMOL 500 MG PO TABS
500.0000 mg | ORAL_TABLET | Freq: Three times a day (TID) | ORAL | Status: DC
  Administered 2015-10-22 – 2015-10-23 (×4): 500 mg via ORAL
  Filled 2015-10-22 (×4): qty 1

## 2015-10-22 NOTE — Progress Notes (Signed)
Physical Therapy Session Note  Patient Details  Name: Danny Patterson MRN: [id number] Date of Birth: 1945-04-14  Today's Date: 10/22/2015 PT Individual Time: 0905-1000 and 1400-1500 PT Individual Time Calculation (min): 55 min and 60 min (total 115 min)    Short Term Goals: Week 1:  PT Short Term Goal 1 (Week 1): Pt will perform bed mobility minA using bed features PT Short Term Goal 2 (Week 1): Pt will perform sit <>stand minA PT Short Term Goal 3 (Week 1): Pt will ambulate 150' LRAD minA PT Short Term Goal 4 (Week 1): Pt will initiate stair training PT Short Term Goal 5 (Week 1): Pt will verbalize back precautions with min cueing from therapist on three consecutive days  Skilled Therapeutic Interventions/Progress Updates:    Tx 1: Pt received seated in w/c, c/o pain as described below and agreeable to treatment. Pt dons shoes with S and increased time. Gait to gym x150' with RW and S; forward flexed posture improved with cueing. Car transfer, gait over ramp and mulch with RW all performed with S. Nustep x10 min with BUE/BLE level 3 with average 53 steps, performed for strengthening and aerobic endurance. Stairs 2x 8 on 6" height steps with B handrails and S. Discussed grad day, d/c planning including DME and follow up therapy with pt; discussed with CSW to prepare to d/c. Returned to room with gait x150' S and RW. Remained seated in w/c at end of session, all needs in reach.   Tx 2: Pt received seated in w/c, c/o pain as below and agreeable to treatment. Gait to gym with RW and S x150'. Nustep x12 min level 6 average 40 steps/min with BUE/BLE for strengthening, endurance. Bed mobility with S and no cues needed for log roll technique. Couch transfer from low height modA for sit >stand. Gait outdoors on level/unlevel surfaces for 3 trials of >300' each with RW and S. Min cues for upright posture to reduce forward flexion and reliance on RW. Unwilling to attempt ambulation without AD. Returned to room  and remained seated in w/c with all needs in reach at end of session.   Therapy Documentation Precautions:  Precautions Precautions: Back Precaution Comments: Pt unable to recall any precautions during first session of the day; "My doctor told me I can do just about whatever I want as long as I know my limits". Educated and repeated back, unable to recall 5 min later. Required Braces or Orthoses: Spinal Brace Spinal Brace: Lumbar corset Restrictions Weight Bearing Restrictions: No Pain: Pain Assessment Pain Assessment: 0-10 Pain Score: 7  Pain Type: Surgical pain Pain Location: Back Pain Orientation: Lower Pain Descriptors / Indicators: Aching Pain Frequency: Constant Pain Onset: On-going Patients Stated Pain Goal: 2 Pain Intervention(s): Ambulation/increased activity;Repositioned Multiple Pain Sites: No   See Function Navigator for Current Functional Status.   Therapy/Group: Individual Therapy  Vista Lawman 10/22/2015, 9:59 AM

## 2015-10-22 NOTE — Plan of Care (Deleted)
Occupational Therapy Session Note  Patient Details  Name: Danny Patterson MRN: [id number] Date of Birth: [birth year]  Today's Date: 10/22/2015 OT Individual Time:  0730- 0830 Treatment Time: 60 min  Short Term Goals: Week 1:  OT Short Term Goal 1 (Week 1): Pt will recall proper use for AE during LB dressing with 3 or less VCs OT Short Term Goal 2 (Week 1): Pt will complete toileting task with supervision OT Short Term Goal 3 (Week 1): Pt will complete2 grooming tasks standing at sink with supervision in order to increase independence with ADLs OT Short Term Goal 4 (Week 1): Pt will recall 3/3 spinal precautions 3 consecutive days with less than1 VC each day  Skilled Therapeutic Interventions/Progress Updates:    Pt seen for skilled OT session focusing on self care. Pt supine in bed and agreeable to tx session upon arrival. Pt ambulated throughout session with RW and close supervision needing VC for RW safety. Pt donned back brace backwards needing VC to fix mistake. Pt ambulated to toilet and undressed LB sitting on toilet chair. Pt verbalized he should not bend, but feels "he has to bend and does not care about back precautions."  Pt completed shower with supervision needing VC to use long handled sponge to wash feet (pt calls "tooth brush"). Pt dressed UB and donned back brace with supervision and ambulated to w/c to dress LB with reacher. Pt brushed teeth in standing at sink with one hand supported bending to spit and get water. OT educated on the importance of following back precautions, but pt states "he will not follow at home." Pt left sitting in w/c with all needs met. Throughout session OT educated on back precautions and recommended bariatric 3-1 commode for shower usage and installing grab bars for shower.  Therapy Documentation Precautions:  Precautions Precautions: Back Precaution Comments: Pt unable to recall any precautions during first session of the day; "My doctor told me I can  do just about whatever I want as long as I know my limits". Educated and repeated back, unable to recall 5 min later. Required Braces or Orthoses: Spinal Brace Spinal Brace: Lumbar corset Restrictions Weight Bearing Restrictions: No Pain: Denies pain   See Function Navigator for Current Functional Status.   Therapy/Group: Individual Therapy  Matilde Bash 10/22/2015, 12:00 PM

## 2015-10-22 NOTE — Progress Notes (Signed)
[place] PHYSICAL MEDICINE & REHABILITATION     PROGRESS NOTE    Subjective/Complaints: Lying down. In better spirits today. Back sore/legs sore. Feels that his pain is a little better today  ROS: limited due to cognition and behavior    Objective: Vital Signs: Blood pressure (!) 149/85, pulse 89, temperature 97.6 F (36.4 C), temperature source Oral, resp. rate 16, height  (1.778 m), weight 100 kg (220 lb 7.4 oz), SpO2 96 %. No results found.  Recent Labs  10/20/15 0642  WBC 9.1  HGB 9.6*  HCT 28.8*  PLT 481*    Recent Labs  10/20/15 1353  NA 141  K 4.0  CL 106  GLUCOSE 196*  BUN 22*  CREATININE 1.11  CALCIUM 9.2   CBG (last 3)   Recent Labs  10/21/15 1622 10/21/15 2055 10/22/15 0637  GLUCAP 103* 148* 95    Wt Readings from Last 3 Encounters:  10/15/15 100 kg (220 lb 7.4 oz)  10/10/15 105 kg (231 lb 7.7 oz)  09/24/15 111.4 kg (245 lb 9.5 oz)    Physical Exam:  Constitutional: He is oriented to person, place, and time. He appears well-developed and well-nourished.  HENT:  Head: Normocephalic and atraumatic.  Mouth/Throat: Oropharynx is clear and moist.  Eyes: Conjunctivae are normal. Pupils are equal, round, and reactive to light.  Neck: Normal range of motion. Neck supple.  Cardiovascular: Normal rate and regular rhythm.   Respiratory: Effort normal. He has rhonchi. He exhibits no tenderness.  GI: Soft. Bowel sounds are normal. He exhibits no distension. There is no tenderness.  Musculoskeletal: He exhibits edema R>L LE. Marland Kitchen He exhibits subjective tenderness to palpation in quadriceps area bilaterally..  Neurological: He is alert and oriented to person, place, and time.  Left facial weakness. Mild dysarthria.  Able to answer orientation questions. Recalls events from yesterday. Appropriate conversation. Decreased sensation LLE Motor: B/l UE 4+5 proximal to distal RLE: 4/5 proximal to distal LLE: 4-/5 proximal to distal  Skin: Skin is  warm and dry. No rash noted. No erythema.    Back wound dry Psychiatric: He has a normal mood and affect. His speech is clear. Cognition   impaired. Confused and agitated this am  Assessment/Plan: 1. 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 Physiatrist is providing close team supervision and 24 hour management of active medical problems listed below. Physiatrist and rehab team continue to assess barriers to discharge/monitor patient progress toward functional and medical goals.  Function:  Bathing Bathing position Bathing activity did not occur: Refused Position: Systems developer parts bathed by patient: Right arm, Left arm, Chest, Abdomen, Front perineal area, Right upper leg, Left upper leg, Right lower leg, Left lower leg, Buttocks, Back Body parts bathed by helper: Back, Buttocks  Bathing assist Assist Level: Supervision or verbal cues      Upper Body Dressing/Undressing Upper body dressing Upper body dressing/undressing activity did not occur: Refused What is the patient wearing?: Pull over shirt/dress, Orthosis     Pull over shirt/dress - Perfomed by patient: Thread/unthread right sleeve, Thread/unthread left sleeve, Put head through opening, Pull shirt over trunk       Orthosis activity level: Performed by patient  Upper body assist Assist Level: Set up, Supervision or verbal cues   Set up : To obtain clothing/put away  Lower Body Dressing/Undressing Lower body dressing  What is the patient wearing?: Pants, Non-skid slipper socks, Ted Hose     Pants- Performed by patient: Thread/unthread right pants leg, Thread/unthread left pants leg, Pull pants up/down Pants- Performed by helper: Pull pants up/down Non-skid slipper socks- Performed by patient: Don/doff right sock,  Don/doff left sock Non-skid slipper socks- Performed by helper: Don/doff right sock, Don/doff left sock Socks - Performed by patient: Don/doff right sock, Don/doff left sock   Shoes - Performed by patient: Don/doff right shoe, Don/doff left shoe Shoes - Performed by helper: Fasten right, Fasten left       TED Hose - Performed by helper: Don/doff right TED hose, Don/doff left TED hose  Lower body assist Assist for lower body dressing: Touching or steadying assistance (Pt > 75%)      Toileting Toileting   Toileting steps completed by patient: Performs perineal hygiene Toileting steps completed by helper: Adjust clothing prior to toileting, Adjust clothing after toileting Toileting Assistive Devices: Grab bar or rail  Toileting assist Assist level: Touching or steadying assistance (Pt.75%)   Transfers Chair/bed transfer   Chair/bed transfer method: Ambulatory Chair/bed transfer assist level: Supervision or verbal cues Chair/bed transfer assistive device: Environmental consultant, Designer, fashion/clothing     Max distance: 160 Assist level: Supervision or verbal cues   Wheelchair   Type: Manual Max wheelchair distance: 100 Assist Level: Supervision or verbal cues  Cognition Comprehension Comprehension assist level: Understands basic 90% of the time/cues < 10% of the time  Expression Expression assist level: Expresses basic needs/ideas: With extra time/assistive device  Social Interaction Social Interaction assist level: Interacts appropriately 75 - 89% of the time - Needs redirection for appropriate language or to initiate interaction.  Problem Solving Problem solving assist level: Solves basic 75 - 89% of the time/requires cueing 10 - 24% of the time  Memory Memory assist level: Recognizes or recalls 50 - 74% of the time/requires cueing 25 - 49% of the time   Medical Problem List and Plan: 1.  Gait abnormality, weakness, cognitive deficits secondary to left frontoparietal infarct,  lumbar spondylolisthesis/radiculopathy, lewy body dementia.  -progressing toward goals    2.  DVT Prophylaxis/Anticoagulation:  lovenox  -dopplers normal 3. Pain Management: Tylenol prn.   - heat and ice also  -trying to be conservative with medications due to dementia  -scheduled tylenol q8  -will schedule robaxin TID also---can stagger between tylenol 4. Mood: LCSW to follow for evaluation and support.  5. Neuropsych: This patient is not yet capable of making decisions on his own behalf.  -cognition waxes and wanes/sundowns 6. Skin/Wound Care: skin intact 7. Fluids/Electrolytes/Nutrition: encourage PO  -bmet normal yesterday    8. HTN: Monitor BP bid and titrate as needed. Continue Lasix and Norvasc daily--off normadyne.  - increased lasix to 40mg  qd to help with LE edema---re-check BUN tomorrow 9. OSA?: Will need sleep study on outpatient basis. Encourage IS with flutter valve.  10. ABLA: continue iron supplement.   -hgb holding at 9.6 11. Possible Asp PNA v/s HAP: has completed treatment.  12. Hypokalemia: Resolved with supplementation--  13.  Protein calorie malnutrition: continue protein supplements.  14. T2DM:  Monitor ac/hs and use SSI for elevated BS. Was on metformin at home.   -sugars reviewed and remain under control 15. Urinary retention: resolved.    -ua neg, cx neg  - pvr's 0. 16. Lewy body dementia?:  To be re-evaluated in 4-6 weeks after discharge.   -episodic confusion and agitation 17.Gout: Monitor for  flares--none at present  LOS (Days) 7 A FACE TO FACE EVALUATION WAS PERFORMED  Anisten Tomassi T 10/22/2015 8:39 AM

## 2015-10-22 NOTE — Progress Notes (Signed)
Speech Language Pathology Daily Session Notes  Patient Details  Name: Danny Patterson MRN: [id number] Date of Birth: [birth year]  Today's Date: 10/22/2015  Session 1: SLP Individual Time: 1035-1100 SLP Individual Time Calculation (min): 25 min   Session 2: SLP Individual Time: 1330-1400 SLP Individual Time Calculation (min): 30 min   Short Term Goals: Week 1: SLP Short Term Goal 1 (Week 1): Patient will utilize external memory aids to recall new, daily information with Mod  A verbal and visual cues.  SLP Short Term Goal 2 (Week 1): Patient will demonstrate functional problem solving for basic and familiar tasks with Mod A verbal and visual cues.  SLP Short Term Goal 3 (Week 1): Patient will identify 2 cognitive deficits with Mod A quesiton cues.  SLP Short Term Goal 4 (Week 1): Patient will utilize call bell to request assistance in 50% of observable opportunities with Mod A question cues.   Skilled Therapeutic Interventions:  Session 1: Skilled treatment session focused on cognitive goals. Patient was Mod I for use of external memory aid to recall back precautions and required Mod A question cues for emergent awareness of not utilizing his back precautions during functional tasks and problem solving alternative ways to complete tasks with appropriate safety. Patient left upright in wheelchair with all needs within reach. Continue with current plan of care.   Session 2: Skilled treatment session focused on cognitive goals. Patient participated in a functional conversation with clinician and was Mod I for recall of biographical information. Patient utilized schedule to anticipate upcoming appointments with supervision question cues and verbalized safety precautions in regards to need to utilize call bell with Mod I. Patient left upright in wheelchair with all needs within reach. Continue with current plan of care.   Function:  Cognition Comprehension Comprehension assist level: Understands  complex 90% of the time/cues 10% of the time  Expression   Expression assist level: Expresses complex 90% of the time/cues < 10% of the time  Social Interaction Social Interaction assist level: Interacts appropriately 90% of the time - Needs monitoring or encouragement for participation or interaction.  Problem Solving Problem solving assist level: Solves basic 75 - 89% of the time/requires cueing 10 - 24% of the time  Memory Memory assist level: Recognizes or recalls 50 - 74% of the time/requires cueing 25 - 49% of the time    Pain Pain Assessment Pain Assessment: 0-10 Pain Score: 3  Pain Type: Surgical pain Pain Location: Back Pain Orientation: Lower Pain Descriptors / Indicators: Aching Pain Frequency: Constant Pain Onset: On-going Patients Stated Pain Goal: 2 Pain Intervention(s): Medication (See eMAR);Repositioned Multiple Pain Sites: No  Therapy/Group: Individual Therapy  Taris Galindo 10/22/2015, 4:09 PM

## 2015-10-22 NOTE — Plan of Care (Signed)
Problem: RH SAFETY Goal: RH STG ADHERE TO SAFETY PRECAUTIONS W/ASSISTANCE/DEVICE STG Adhere to Safety Precautions With mod Assistance/Device.   Outcome: Not Progressing Impulsive, fails to call for assistance before getting oob

## 2015-10-22 NOTE — Progress Notes (Signed)
Social Work Patient ID: Danny Patterson, male   DOB: [birth year], 70 y.o.   MRN: [id number]   Met with pt, wife and daughter yesterday afternoon to review team conference.  All aware and agreeable with targeted d/c date of 8/4 and supervision goals.  Reviewed concerns about poor recall of back precautions and daughter understands need for close supervision and cues.  Have scheduled for family ed with wife and daughter tomorrow 10-12 am.  Lennart Pall, LCSW

## 2015-10-22 NOTE — Plan of Care (Cosign Needed)
Pt's goals modified due to early d/c home with wife and daughter who can provide 24/assistance-pt/family able to provide needed assist. Cognition goals modified due to pt's inability to recall back precautions which decreases even more when pt is in a functional environment. LB dressing goal was modified due to pt's inability/desire to cross legs, lift legs and increased frustration with using adaptive equipment causing pt to break back precautions. See POC for goal details.

## 2015-10-22 NOTE — Progress Notes (Cosign Needed)
Occupational Therapy Session Note  Patient Details  Name: Danny Patterson MRN: [id number] Date of Birth: [birth year]  Today's Date: 10/22/2015 OT Individual Time:  0730- 0830 Treatment Time: 60 min  Short Term Goals: Week 1:  OT Short Term Goal 1 (Week 1): Pt will recall proper use for AE during LB dressing with 3 or less VCs OT Short Term Goal 2 (Week 1): Pt will complete toileting task with supervision OT Short Term Goal 3 (Week 1): Pt will complete2 grooming tasks standing at sink with supervision in order to increase independence with ADLs OT Short Term Goal 4 (Week 1): Pt will recall 3/3 spinal precautions 3 consecutive days with less than1 VC each day  Skilled Therapeutic Interventions/Progress Updates:    Pt seen for skilled OT session focusing on self care. Pt supine in bed and agreeable to tx session upon arrival. Pt ambulated throughout session with RW and close supervision needing VC for RW safety. Pt donned back brace backwards needing VC to fix mistake. Pt ambulated to toilet and undressed LB sitting on toilet chair. Pt verbalized he should not bend, but feels "he has to bend and does not care about back precautions."  Pt completed shower with supervision needing VC to use long handled sponge to wash feet (pt calls "tooth brush"). Pt dressed UB and donned back brace with supervision and ambulated to w/c to dress LB with reacher. Pt brushed teeth in standing at sink with one hand supported bending to spit and get water. OT educated on the importance of following back precautions, but pt states "he will not follow at home." Pt left sitting in w/c with all needs met. Throughout session OT educated on back precautions and recommended bariatric 3-1 commode for shower usage and installing grab bars for shower.  Therapy Documentation Precautions:  Precautions Precautions: Back Precaution Comments: Pt unable to recall any precautions during first session of the day; "My doctor told me I  can do just about whatever I want as long as I know my limits". Educated and repeated back, unable to recall 5 min later. Required Braces or Orthoses: Spinal Brace Spinal Brace: Lumbar corset Restrictions Weight Bearing Restrictions: No Pain: Denies pain   See Function Navigator for Current Functional Status.   Therapy/Group: Individual Therapy  Matilde Bash

## 2015-10-22 NOTE — Progress Notes (Signed)
Occupational Therapy Discharge Summary  Patient Details  Name: Danny Patterson MRN: [id number] Date of Birth: 04-06-1945   11:00- 11:30 (30mn)  1:1 Performed family education with pt's daughter on performing self care tasks while maintaining back precautions, sit to stands, short distance functional ambulation with a RW, bed mobility, donning and doffing back brace, safety in the home (with handout), review of basic back precautions, proper/ supportive sleeping positions, importance of 24 hr supervision and est an routine at home, sequencing of shower with doffing/ doning brace and remaining seated in shower, shower stall transfer (stepping over threshold backwards into shower stall, safety of DME equipment. Pt declined bathing and dressing activities- will use yesterday scores for grad day.    Patient has met 10 of 10 long term goals due to improved activity tolerance, improved balance, postural control, improved awareness and improved coordination.  Patient to discharge at overall Supervision level with recommendation of 24/7 supervision due to decr ability to maintain back precautions and decr cognition. .  Patient's care partner, pt's daughter is independent to provide the necessary physical and cognitive assistance at discharge after education has been completed.    Reasons goals not met: n/a  Recommendation:  Patient will benefit from ongoing skilled OT services in outpatient setting to continue to advance functional skills in the area of BADL, iADL and Reduce care partner burden.  [facility] Reasons for discharge: treatment goals met and discharge from hospital  Patient/family agrees with progress made and goals achieved: Yes  OT Discharge Precautions/Restrictions  Precautions Precautions: Back Precaution Comments: Pt unable to consistently recall back precautions and requires max cuing to adhere to precautions during functional tasks.  Required Braces or Orthoses: Spinal  Brace Spinal Brace: Lumbar corset;Applied in sitting position Restrictions Weight Bearing Restrictions: No Vision/Perception  Vision- History Baseline Vision/History: Wears glasses Wears Glasses: Reading only Patient Visual Report: No change from baseline Vision- Assessment Vision Assessment?: No apparent visual deficits  Cognition Overall Cognitive Status: Impaired/Different from baseline Arousal/Alertness: Awake/alert Orientation Level: Oriented X4 Focused Attention: Appears intact Sustained Attention: Appears intact Sustained Attention Impairment: Verbal basic;Functional basic Selective Attention: Appears intact Selective Attention Impairment: Verbal complex;Functional complex Memory: Impaired Memory Impairment: Storage deficit;Retrieval deficit;Decreased recall of new information;Decreased short term memory Decreased Short Term Memory: Verbal basic;Functional basic Awareness: Impaired Awareness Impairment: Emergent impairment Problem Solving: Impaired Problem Solving Impairment: Verbal basic;Functional basic Reasoning: Impaired Reasoning Impairment: Verbal complex;Functional complex Safety/Judgment: Impaired Comments: Impulsive with decreased insight into deficits; unable to recall/ maintain spinal precautions Sensation Sensation Light Touch: Appears Intact Coordination Gross Motor Movements are Fluid and Coordinated: Yes Coordination and Movement Description: Generalized weakness with flexed posture Motor  Motor Motor: Abnormal postural alignment and control Motor - Discharge Observations: Flexed posture and generalized weakness Trunk/Postural Assessment  Cervical Assessment Cervical Assessment: Within Functional Limits Thoracic Assessment Thoracic Assessment: Exceptions to [facility](Kyphotic; spinal precautions) Lumbar Assessment Lumbar Assessment: Ex[facility] to [facility](Posterior pelvic tilt; spinal precautions) Postural Control Postural Control: Deficits on evaluation  (Overall flexed posture with generalized deconditioning)  Balance Balance Balance Assessed: Yes Static Sitting Balance Static Sitting - Balance Support: Feet supported Static Sitting - Level of Assistance: 6: Modified independent (Device/Increase time) Dynamic Sitting Balance Dynamic Sitting - Balance Support: Feet supported;During functional activity Dynamic Sitting - Level of Assistance: 6: Modified independent (Device/Increase time) Static Standing Balance Static Standing - Balance Support: During functional activity Static Standing - Level of Assistance: 6: Modified independent (Device/Increase time);5: Stand by assistance Dynamic Standing Balance Dynamic Standing -  Balance Support: During functional activity;Right upper extremity supported;Left upper extremity supported Dynamic Standing - Level of Assistance: 5: Stand by assistance Extremity/Trunk Assessment RUE Assessment RUE Assessment: Within Functional Limits LUE Assessment LUE Assessment: Within Functional Limits   See Function Navigator for Current Functional Status.  Bobby Rumpf, Amy C 10/22/2015, 4:22 PM

## 2015-10-22 NOTE — Patient Care Conference (Signed)
Inpatient RehabilitationTeam Conference and Plan of Care Update Date: 10/21/2015   Time: 2:00 PM    [name]      Medical Record Number: [id number]  Date of Birth: 05-30-1945 Sex: Male         Room/Bed: 4M01C/4M01C-01 Payor Info: Payor: [employer] / Plan: [employer] / Product Type: *No Product type* /    Admitting Diagnosis: Lumbar Fusion  Admit Date/Time:  10/15/2015  5:57 PM Admission Comments: No comment available   Primary Diagnosis:  Osteoarthritis of spine with radiculopathy, lumbar region Principal Problem: Osteoarthritis of spine with radiculopathy, lumbar region  Patient Active Problem List   Diagnosis Date Noted  . Osteoarthritis of spine with radiculopathy, lumbar region 10/15/2015  . Abnormal MRI   . Slurred speech   . Surgery, elective   . Loose stools   . OSA (obstructive sleep apnea)   . Acute blood loss anemia   . Pneumonia   . Urinary retention   . Lewy body dementia   . Encephalopathy   . Altered mental status   . Acute encephalopathy 10/12/2015  . Hypokalemia 10/12/2015  . Anemia 10/12/2015  . S/P lumbar fusion 10/12/2015  . Acute respiratory failure (HCC)   . Degenerative disc disease, lumbar 10/01/2015  . Sciatica of right side associated with disorder of lumbosacral spine 07/21/2015  . Hypertension 02/18/2015  . Diabetes (HCC) 02/18/2015  . Gout 02/18/2015  . Hyperlipidemia 02/18/2015  . Arthritis 02/18/2015  . Encounter to establish care 02/18/2015    Expected Discharge Date: Expected Discharge Date: 10/24/15  Team Members Present: Physician leading conference: Dr. Faith Rogue Social Worker Present: Amada Jupiter, LCSW Nurse Present: Ronny Bacon, RN PT Present: Karolee Stamps, Marcene Brawn, PT OT Present: Johnsie Cancel, OT SLP Present: Fae Pippin, SLP PPS Coordinator present : Edson Snowball, PT     Current Status/Progress Goal Weekly Team Focus  Medical   lumbar radiculopathy s/p  decompression/fusion. lewy body dementia---cognition and behavior can wax and wane.   improve consistency of balance/strength/safety  pain mgt, cognition/sleep, nutrition   Bowel/Bladder   continent of bowel/bladder. LBM 10/20/15  Remain continent of bowel/bladder while in RH with min assist  Monitor bowel/bladder function q shift and as needed.   Swallow/Nutrition/ Hydration             ADL's   Min A shower transfers; Max A LB dressing; set-up/supervision UB dressing and grooming  Supervision-mod I with min A for tub/shower transfer  Functional transfers, LB dressing, family education and carry over of back precautions    Mobility   min guard to S gait, stairs and transfers. Limited activity tolerance  S overall, gait with LRAD  activity tolerance, LE strengthening, gait/stair training   Communication             Safety/Cognition/ Behavioral Observations  Mod-Max assist   Min assist  recall and carryover of new safety information with regard to safety restrictions   Pain   Conplained of pain of 6/10. Tylenol 650 mg PRN given.  <3   Monitor and treat pain q shift and as needed.   Skin   Surgical incision on mid lower back with dry dressing.  Skin to be free of breakdown/infection while in [facility] with mod assist.  Monitor skin q shift and as neeeded.    Rehab Goals Patient on target to meet rehab goals: Yes *See Care Plan and progress notes for long and short-term goals.  Barriers to Discharge: dementia/poor carryover/ decreased safety  awareness    Possible Resolutions to Barriers:  family education, supervision/ limit neurosedating meds    Discharge Planning/Teaching Needs:  Home with wife and daughter, Lyla Son, to provide 24/7 assistance/ supervision      Team Discussion:  Mood fluctuates;  Consistently c/o pain management. Does not recall back precautions.  Currently reaching a supervision level with walker and on track to meet goals.  Revisions to Treatment Plan:  None    Continued Need for Acute Rehabilitation Level of Care: The patient requires daily medical management by a physician with specialized training in physical medicine and rehabilitation for the following conditions: Daily direction of a multidisciplinary physical rehabilitation program to ensure safe treatment while eliciting the highest outcome that is of practical value to the patient.: Yes Daily medical management of patient stability for increased activity during participation in an intensive rehabilitation regime.: Yes Daily analysis of laboratory values and/or radiology reports with any subsequent need for medication adjustment of medical intervention for : Neurological problems;Post surgical problems;Mood/behavior problems  Anamae Rochelle 10/22/2015, 12:02 PM

## 2015-10-23 ENCOUNTER — Inpatient Hospital Stay (HOSPITAL_COMMUNITY): Payer: PPO | Admitting: Speech Pathology

## 2015-10-23 ENCOUNTER — Inpatient Hospital Stay (HOSPITAL_COMMUNITY): Payer: PPO | Admitting: Physical Therapy

## 2015-10-23 ENCOUNTER — Inpatient Hospital Stay (HOSPITAL_COMMUNITY): Payer: PPO | Admitting: Occupational Therapy

## 2015-10-23 ENCOUNTER — Encounter (HOSPITAL_COMMUNITY): Payer: PPO | Admitting: Occupational Therapy

## 2015-10-23 ENCOUNTER — Encounter (HOSPITAL_COMMUNITY): Payer: PPO

## 2015-10-23 DIAGNOSIS — R269 Unspecified abnormalities of gait and mobility: Secondary | ICD-10-CM | POA: Diagnosis not present

## 2015-10-23 DIAGNOSIS — F4321 Adjustment disorder with depressed mood: Secondary | ICD-10-CM

## 2015-10-23 DIAGNOSIS — M4726 Other spondylosis with radiculopathy, lumbar region: Secondary | ICD-10-CM | POA: Diagnosis not present

## 2015-10-23 LAB — BASIC METABOLIC PANEL
Anion gap: 8 (ref 5–15)
BUN: 20 mg/dL (ref 6–20)
CALCIUM: 9.4 mg/dL (ref 8.9–10.3)
CO2: 27 mmol/L (ref 22–32)
CREATININE: 0.94 mg/dL (ref 0.61–1.24)
Chloride: 106 mmol/L (ref 101–111)
Glucose, Bld: 104 mg/dL — ABNORMAL HIGH (ref 65–99)
Potassium: 4.2 mmol/L (ref 3.5–5.1)
SODIUM: 141 mmol/L (ref 135–145)

## 2015-10-23 LAB — GLUCOSE, CAPILLARY
GLUCOSE-CAPILLARY: 104 mg/dL — AB (ref 65–99)
GLUCOSE-CAPILLARY: 132 mg/dL — AB (ref 65–99)

## 2015-10-23 LAB — URIC ACID: Uric Acid, Serum: 7.8 mg/dL — ABNORMAL HIGH (ref 4.4–7.6)

## 2015-10-23 MED ORDER — POTASSIUM CHLORIDE CRYS ER 20 MEQ PO TBCR: 20.0000 meq | EXTENDED_RELEASE_TABLET | Freq: Every day | ORAL | Status: DC

## 2015-10-23 MED ORDER — ATORVASTATIN CALCIUM 20 MG PO TABS: 20.0000 mg | ORAL_TABLET | Freq: Every day | ORAL | 0 refills | Status: DC

## 2015-10-23 MED ORDER — COLCHICINE 0.6 MG PO TABS: 0.6000 mg | ORAL_TABLET | ORAL | Status: DC | PRN

## 2015-10-23 MED ORDER — POTASSIUM CHLORIDE CRYS ER 20 MEQ PO TBCR: 20.0000 meq | EXTENDED_RELEASE_TABLET | Freq: Every day | ORAL | 0 refills | Status: DC

## 2015-10-23 MED ORDER — ACETAMINOPHEN 325 MG PO TABS: 650.0000 mg | ORAL_TABLET | Freq: Three times a day (TID) | ORAL | Status: DC

## 2015-10-23 MED ORDER — ACETAMINOPHEN 325 MG PO TABS
325.0000 mg | ORAL_TABLET | Freq: Four times a day (QID) | ORAL | Status: DC | PRN
Start: 2015-10-23 — End: 2015-10-23

## 2015-10-23 MED ORDER — TAMSULOSIN HCL 0.4 MG PO CAPS: 0.4000 mg | ORAL_CAPSULE | Freq: Every day | ORAL | 0 refills | Status: DC

## 2015-10-23 MED ORDER — METHOCARBAMOL 500 MG PO TABS: 500.0000 mg | ORAL_TABLET | Freq: Three times a day (TID) | ORAL | 0 refills | Status: DC

## 2015-10-23 MED ORDER — AMLODIPINE BESYLATE 10 MG PO TABS: 5.0000 mg | ORAL_TABLET | Freq: Every day | ORAL | 1 refills | Status: DC

## 2015-10-23 MED ORDER — COLCHICINE 0.6 MG PO TABS
0.6000 mg | ORAL_TABLET | Freq: Two times a day (BID) | ORAL | Status: DC
  Administered 2015-10-23: 0.6 mg via ORAL
  Filled 2015-10-23: qty 1

## 2015-10-23 MED ORDER — ASPIRIN 325 MG PO TABS: 325.0000 mg | ORAL_TABLET | Freq: Every day | ORAL | Status: DC

## 2015-10-23 NOTE — Progress Notes (Signed)
Patient and daughter express multiple issues:  1. Question gout flare up: she reports that he has had knee and foot pain similar to gout flare up. Want this treated. No erythema, warmth or  pain with palpation of bilateral knees for ROM ankle toes. Edema R>LLE stable. Will check uric acid level and treat with dose of colchicine.  2. Pain control: discussed need to avoid narcotics and that robaxin/tylenol are scheduled. 3. Family education: To be completed today. Patient and she would like to move up discharge today. SW contacted and to address equipment and HH needs.

## 2015-10-23 NOTE — Discharge Summary (Signed)
Physician Discharge Summary  Patient ID: Danny Patterson MRN: [id number] DOB/AGE: 70-Jul-1947 70 y.o.  Admit date: 10/15/2015 Discharge date: 10/23/2015  Discharge Diagnoses:  Principal Problem:   Osteoarthritis of spine with radiculopathy, lumbar region Active Problems:   Diabetes (HCC)   Degenerative disc disease, lumbar   Acute encephalopathy   S/P lumbar fusion   Acute blood loss anemia   Lewy body dementia   Discharged Condition:  Stable.     Labs:  Basic Metabolic Panel:  Recent Labs Lab 10/20/15 1353 10/23/15 0528  NA 141 141  K 4.0 4.2  CL 106 106  CO2 26 27  GLUCOSE 196* 104*  BUN 22* 20  CREATININE 1.11 0.94  CALCIUM 9.2 9.4    CBC:  Recent Labs Lab 10/20/15 0642  WBC 9.1  HGB 9.6*  HCT 28.8*  MCV 89.7  PLT 481*    CBG:  Recent Labs Lab 10/22/15 1145 10/22/15 1634 10/22/15 2034 10/23/15 0634 10/23/15 1136  GLUCAP 117* 108* 154* 104* 132*    Brief HPI:    Danny Patterson an 70 y.o.malewho was admitted to the hospital on 07/12 for bilateral L3- L5 laminectomy with diskectomy and interbody fusion by Dr. Jeral Fruit.  Post op course complicated by acute hypercarbic respiratory failure with septic shock due to aspiration PNA requiring intubation, ABLA requiring multiple units PRBC as well as urinary retention .   CTA chest negative for PE and elevated troponin's felt to be due to demand ischemia.  TEE done showing normal EF 55-60% with no effusion and no evidence of elevated filling pressures. He tolerated extubation on 07/20 but continued to have agitation with confusion and MRI brain done 7/24 revealing possible 4 mm restricted diffusion left frontoparietal region question early or subacute infarct and evidence of small vessel disease. Neurology felt that patient with possible small ischemic change but not sufficient to explain patient's symptoms.    Family did report patient having recent changes in personality with shuffling gait, mild tremors as well  as memory problems PTA.. Dr. Armstrong/Neurology felt that patient's symptoms highly suggestive of Lewy body dementia and recommended  Neurology evaluation in 4-6 weeks as well as sleep study for work up of OSA. Patient with deficits in ability to carry out ADL tasks as well as mobility and CIR recommended for follow up therapy.    Hospital Course: Danny Patterson was admitted to rehab 10/15/2015 for inpatient therapies to consist of PT, ST and OT at least three hours five days a week. Past admission physiatrist, therapy team and rehab RN have worked together to provide customized collaborative inpatient rehab. Back wound is clean, dry and intact and is healing well without signs or symptoms of infection.  Foley was discontinued after admission and voiding was monitored with bladder scans. He is voiding without difficulty with PVR checks showing volumes at 0- 52 cc. UA/UCS was negative for infection.  Check of lytes showed pre renal azotemia which has resolved with increase in fluid intake. Lasix was increased to 40 mg to help manage BLE edema and hypokalemia has resolved with supplementation. He was also advised to use support stockings and elevate BLE for edema control. Lasix was decreased to 20 mg daily at discharge and he is to follow up with PMD for further adjustment in diuretic as indicated.   Serial CBC showed that H/H is stable. Diabetes was monitored with ac/hs checks and blood sugars are well controlled. Back incision is intact and healing well without signs or symptoms  of infection. He is continent of bowel and bladder. Respiratory status has been stable with increase in activity tolerance.  He did report symptoms consistent with impending gout flare and uric acid level showed boderline elevation at 7.8. He was received a dose of colchicine  0.6 mg and continue home protocol after discharge. His cognition has improved however he continues to display poor safety awareness with impulsive behaviors. He  continues to require cues to maintain back precautions.  He has made good progress and is currently at supervision level. He will continue to receive follow up HHPT, HHOT,HHRN by [facility] after discharge.    Rehab course: During patient's stay in rehab weekly team conferences were held to monitor patient's progress, set goals and discuss barriers to discharge.  Cognitive evaluation revealed moderate STM deficits with MoCA score 13/30. He required required min-max assist with basic self care tasks and moderate assist with mobility.  He has had improvement in activity tolerance, balance, postural control, as well as ability to compensate for deficits. He requires supervision for bathing and min assist with LB dressing due to frustration/inability to use AE.  He is able to ambulate 300' with supervision and RW. He requires min guard assist to supervision to navigate 4 stairs. He requires moderate assist with cognitive tasks and min to moderate assist for recall for memory and to utilize external memory aids.      Disposition: 01-Home or Self Care  Diet: Diabetic.   Special Instructions: 1. Back precautions. Wear brace when at edge of bed or out of bed. 2. No lifting, driving or strenuous activity till cleared by MD.  3. Needs outpatient sleep study to evaluate for OSA.    Discharge Instructions    Ambulatory referral to Physical Medicine Rehab    Complete by:  As directed   1-2 week post op check       Medication List    STOP taking these medications   labetalol 200 MG tablet Commonly known as:  NORMODYNE   naproxen sodium 220 MG tablet Commonly known as:  ANAPROX   simvastatin 40 MG tablet Commonly known as:  ZOCOR     TAKE these medications   acetaminophen 325 MG tablet Commonly known as:  TYLENOL Take 2 tablets (650 mg total) by mouth 4 (four) times daily -  with meals and at bedtime.   amLODipine 10 MG tablet Commonly known as:  NORVASC Take 0.5 tablets (5 mg total)  by mouth daily. What changed:  how much to take   aspirin 325 MG tablet Take 1 tablet (325 mg total) by mouth daily.   atorvastatin 20 MG tablet Commonly known as:  LIPITOR Take 1 tablet (20 mg total) by mouth daily at 6 PM.   colchicine 0.6 MG tablet Take 1 tablet (0.6 mg total) by mouth as needed (3 on day one of flare then one daily after.).   furosemide 20 MG tablet Commonly known as:  LASIX Take 20 mg by mouth daily.   magnesium gluconate 500 MG tablet Commonly known as:  MAGONATE Take 500 mg by mouth daily.   metFORMIN 500 MG tablet Commonly known as:  GLUCOPHAGE Take 1 tablet each AM with breakfast. What changed:  how much to take  how to take this  when to take this  additional instructions   methocarbamol 500 MG tablet Commonly known as:  ROBAXIN Take 1 tablet (500 mg total) by mouth 3 (three) times daily. For muscle aches/spasms   potassium chloride  SA 20 MEQ tablet Commonly known as:  K-DUR,KLOR-CON Take 1 tablet (20 mEq total) by mouth daily. Start taking on:  10/24/2015   tamsulosin 0.4 MG Caps capsule Commonly known as:  FLOMAX Take 1 capsule (0.4 mg total) by mouth daily.      Follow-up Information    Ranelle Oyster, MD .   Specialty:  Physical Medicine and Rehabilitation Contact information: [address] [place] [address] [phone]        Karn Cassis, MD .   Specialty:  Neurosurgery Contact information: [address] [place] [address] [phone]        [facility] .   Why:  Call today for work up/dementia evaluation Contact information: [address] [place] [address] [phone]       Fidel Levy, MD Follow up on 11/06/2015.   Specialty:  Family Medicine Why:  APPOINTMENT @ 2:00 PM Contact information: [address] [place] [address] [phone]           Signed: Jacquelynn Cree 10/23/2015, 4:12 PM

## 2015-10-23 NOTE — Progress Notes (Signed)
Pt. Got d/c instructions and follow up appointments.Pt. Is ready to go home with her daughter.

## 2015-10-23 NOTE — Progress Notes (Signed)
Speech Language Pathology Discharge Summary  Patient Details  Name: [name] MRN: [id number] Date of Birth: Aug 14, 1945  Patient has met 1 of 3 long term goals.  Patient to discharge at overall Mod level.   Reasons goals not met: Patient with a shorter length of stay than originally planned and continues to require overall Mod A to complete tasks   Clinical Impression/Discharge Summary: Patient has made minimal gains and has met 1 of 3 LTG's this admission due to a shorter length of stay than originally planned. Currently, patient requires overall Mod A to complete functional and familiar tasks safely in regards to recall, problem solving and awareness. Patient and family education is complete and patient will discharge home today with 24 hour supervision from family. Patient would benefit from f/u SLP services to maximize cognitive function and overall functional independence in order to reduce caregiver burden.   Care Partner:  Caregiver Able to Provide Assistance: Yes  Type of Caregiver Assistance: Physical;Cognitive  Recommendation:  24 hour supervision/assistance;Home Health SLP  Rationale for SLP Follow Up: Maximize cognitive function and independence;Reduce caregiver burden   Equipment: N/A   Reasons for discharge: Discharged from hospital   Patient/Family Agrees with Progress Made and Goals Achieved: Yes     Brayam Boeke 10/23/2015, 3:35 PM

## 2015-10-23 NOTE — Progress Notes (Signed)
[place] PHYSICAL MEDICINE & REHABILITATION     PROGRESS NOTE    Subjective/Complaints: Slept well. Pain seems better controlled. Just waking up  ROS: limited due to cognition and behavior    Objective: Vital Signs: Blood pressure (!) 152/88, pulse 88, temperature 97.8 F (36.6 C), temperature source Oral, resp. rate 18, height 5\' 10"  (1.778 m), weight 98.6 kg (217 lb 6 oz), SpO2 100 %. No results found. No results for input(s): WBC, HGB, HCT, PLT in the last 72 hours.  Recent Labs  10/20/15 1353 10/23/15 0528  NA 141 141  K 4.0 4.2  CL 106 106  GLUCOSE 196* 104*  BUN 22* 20  CREATININE 1.11 0.94  CALCIUM 9.2 9.4   CBG (last 3)   Recent Labs  10/22/15 1634 10/22/15 2034 10/23/15 0634  GLUCAP 108* 154* 104*    Wt Readings from Last 3 Encounters:  10/23/15 98.6 kg (217 lb 6 oz)  10/10/15 105 kg (231 lb 7.7 oz)  09/24/15 111.4 kg (245 lb 9.5 oz)    Physical Exam:  Constitutional: He is oriented to person, place, and time. He appears well-developed and well-nourished.  HENT:  Head: Normocephalic and atraumatic.  Mouth/Throat: Oropharynx is clear and moist.  Eyes: Conjunctivae are normal. Pupils are equal, round, and reactive to light.  Neck: Normal range of motion. Neck supple.  Cardiovascular: Normal rate and regular rhythm.   Respiratory: Effort normal. He has rhonchi. He exhibits no tenderness.  GI: Soft. Bowel sounds are normal. He exhibits no distension. There is no tenderness.  Musculoskeletal: He exhibits edema R>L LE. Marland Kitchen He exhibits subjective tenderness to palpation in quadriceps area bilaterally..  Neurological: He is alert and oriented to person, place, and time.  Left facial weakness. Mild dysarthria.  Able to answer orientation questions. Recalls events from yesterday. Appropriate conversation. Decreased sensation LLE Motor: B/l UE 4+5 proximal to distal RLE: 4/5 proximal to distal LLE: 4-/5 proximal to distal  Skin: Skin is warm and dry. No  rash noted. No erythema.    Back wound dry Psychiatric: He has a normal mood and affect. His speech is clear. Cognition   impaired. Confused and agitated this am  Assessment/Plan: 1. 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 Physiatrist is providing close team supervision and 24 hour management of active medical problems listed below. Physiatrist and rehab team continue to assess barriers to discharge/monitor patient progress toward functional and medical goals.  Function:  Bathing Bathing position Bathing activity did not occur: Refused Position: Systems developer parts bathed by patient: Right arm, Left arm, Chest, Abdomen, Front perineal area, Right upper leg, Left upper leg, Right lower leg, Left lower leg, Buttocks, Back Body parts bathed by helper: Back, Buttocks  Bathing assist Assist Level: Supervision or verbal cues      Upper Body Dressing/Undressing Upper body dressing Upper body dressing/undressing activity did not occur: Refused What is the patient wearing?: Pull over shirt/dress, Orthosis     Pull over shirt/dress - Perfomed by patient: Thread/unthread right sleeve, Thread/unthread left sleeve, Put head through opening, Pull shirt over trunk       Orthosis activity level: Performed by patient  Upper body assist Assist Level: Set up, Supervision or verbal cues   Set up : To obtain clothing/put away  Lower Body Dressing/Undressing Lower body dressing   What is the patient wearing?: Pants, Non-skid  slipper socks, Ted Hose     Pants- Performed by patient: Thread/unthread right pants leg, Thread/unthread left pants leg, Pull pants up/down Pants- Performed by helper: Pull pants up/down Non-skid slipper socks- Performed by patient: Don/doff right sock, Don/doff left  sock Non-skid slipper socks- Performed by helper: Don/doff right sock, Don/doff left sock Socks - Performed by patient: Don/doff right sock, Don/doff left sock   Shoes - Performed by patient: Don/doff right shoe, Don/doff left shoe Shoes - Performed by helper: Fasten right, Fasten left       TED Hose - Performed by helper: Don/doff right TED hose, Don/doff left TED hose  Lower body assist Assist for lower body dressing: Touching or steadying assistance (Pt > 75%)      Toileting Toileting   Toileting steps completed by patient: Adjust clothing prior to toileting, Performs perineal hygiene, Adjust clothing after toileting Toileting steps completed by helper: Adjust clothing prior to toileting, Adjust clothing after toileting Toileting Assistive Devices: Grab bar or rail  Toileting assist Assist level: Touching or steadying assistance (Pt.75%)   Transfers Chair/bed transfer   Chair/bed transfer method: Ambulatory Chair/bed transfer assist level: Supervision or verbal cues Chair/bed transfer assistive device: Environmental consultant, Designer, fashion/clothing     Max distance: 300 Assist level: Supervision or verbal cues   Wheelchair   Type: Manual Max wheelchair distance: 100 Assist Level: Supervision or verbal cues  Cognition Comprehension Comprehension assist level: Understands complex 90% of the time/cues 10% of the time  Expression Expression assist level: Expresses complex 90% of the time/cues < 10% of the time  Social Interaction Social Interaction assist level: Interacts appropriately 90% of the time - Needs monitoring or encouragement for participation or interaction.  Problem Solving Problem solving assist level: Solves basic 75 - 89% of the time/requires cueing 10 - 24% of the time  Memory Memory assist level: Recognizes or recalls 50 - 74% of the time/requires cueing 25 - 49% of the time   Medical Problem List and Plan: 1.  Gait abnormality, weakness, cognitive deficits  secondary to left frontoparietal infarct, lumbar spondylolisthesis/radiculopathy, lewy body dementia.  -progressing toward goals  -family ed prior to dc  -supervision goals 2.  DVT Prophylaxis/Anticoagulation:  lovenox  -dopplers normal 3. Pain Management: Tylenol prn.   - heat and ice also  -trying to be conservative with medications due to dementia  -scheduled tylenol q8  - scheduled robaxin TID also---staggered 4. Mood: LCSW to follow for evaluation and support.  5. Neuropsych: This patient is not yet capable of making decisions on his own behalf.  -cognition waxes and wanes/sundowns 6. Skin/Wound Care: skin intact 7. Fluids/Electrolytes/Nutrition: encourage PO  -bmet normal upon personal review today      8. HTN: Monitor BP bid and titrate as needed. Continue Lasix and Norvasc daily--off normadyne.  - continue lasix 40mg  qd  -probably will reduce back to 20mg  at dc 9. OSA?: Will need sleep study on outpatient basis. Encourage IS with flutter valve.  10. ABLA: continue iron supplement.   -hgb holding at 9.6 11. Possible Asp PNA v/s HAP: has completed treatment.  12. Hypokalemia: Resolved with supplementation--  13.  Protein calorie malnutrition: continue protein supplements.  14. T2DM:  Monitor ac/hs and use SSI for elevated BS. Was on metformin at home.   -sugars reviewed and remain under control 15. Urinary retention: resolved.    -ua neg, cx neg  - pvr's 0. 16. Lewy body dementia?:  To be re-evaluated in 4-6 weeks  after discharge.   -episodic confusion and agitation 17.Gout: Monitor for flares--none at present  LOS (Days) 8 A FACE TO FACE EVALUATION WAS PERFORMED  Buster Schueller T 10/23/2015 8:05 AM

## 2015-10-23 NOTE — Progress Notes (Signed)
Physical Therapy Discharge Summary  Patient Details  Name: Danny Patterson MRN: [id number] Date of Birth: 1945/12/29  Today's Date: 10/23/2015 PT Individual Time: 1134-1204 PT Individual Time Calculation (min): 30 min     Patient has met 12 of 12 long term goals due to improved activity tolerance, improved balance, increased strength and decreased pain.  Patient to discharge at an ambulatory level Supervision.   Patient's care partner(daughter) is independent to provide the necessary physical and cognitive assistance at discharge.   Recommendation:  Patient will benefit from ongoing skilled PT services in home health setting to continue to advance safe functional mobility, address ongoing impairments in safety awareness, cardiovascular endurance, strength, and functional mboility, and minimize fall risk.  Equipment: Rolling walker and commode   Reasons for discharge: treatment goals met  Patient/family agrees with progress made and goals achieved: Yes  PT Discharge Precautions/RestrictionsPrecautions Precautions: Back Precaution Comments: Pt unable to consistently recall back precautions and requires max cuing to adhere to precautions during functional tasks.  Required Braces or Orthoses: Spinal Brace Spinal Brace: Lumbar corset;Applied in sitting position Restrictions Weight Bearing Restrictions: No Pain Pain Assessment Pain Assessment: 0-10 Pain Score: 6  Pain Type: Chronic pain Pain Location: Back Pain Orientation: Lower Pain Descriptors / Indicators: Aching Pain Frequency: Constant Pain Onset: On-going Patients Stated Pain Goal: 2 Pain Intervention(s): Medication (See eMAR);Repositioned Multiple Pain Sites: No     Cognition Overall Cognitive Status: Impaired/Different from baseline Arousal/Alertness: Awake/alert Orientation Level: Oriented X4 Attention: Focused;Sustained;Selective Focused Attention: Appears intact Sustained Attention: Appears intact Sustained  Attention Impairment: Verbal basic;Functional basic Selective Attention: Appears intact Memory: Impaired Memory Impairment: Storage deficit;Retrieval deficit;Decreased recall of new information;Decreased short term memory Decreased Short Term Memory: Verbal basic;Functional basic Awareness: Impaired Awareness Impairment: Emergent impairment Problem Solving: Impaired Problem Solving Impairment: Verbal basic;Functional basic Reasoning: Impaired Motor  Motor Motor: Abnormal postural alignment and control Motor - Skilled Clinical Observations: Flexed posture and generalized weakness Motor - Discharge Observations: Flexed posture and generalized weakness  Mobility Bed Mobility Bed Mobility: Rolling Left;Left Sidelying to Sit Rolling Left: 5: Supervision Left Sidelying to Sit: 5: Supervision Left Sidelying to Sit Details: Verbal cues for technique;Verbal cues for precautions/safety;Verbal cues for sequencing;Manual facilitation for placement;Manual facilitation for weight shifting;Tactile cues for placement Transfers Transfers: Yes Sit to Stand: 5: Supervision;With armrests Sit to Stand Details: Verbal cues for technique;Verbal cues for precautions/safety Stand to Sit: 5: Supervision Stand to Sit Details (indicate cue type and reason): Verbal cues for technique;Verbal cues for precautions/safety;Verbal cues for safe use of DME/AE Stand Pivot Transfers: 5: Supervision Stand Pivot Transfer Details: Verbal cues for precautions/safety;Verbal cues for technique;Verbal cues for sequencing Locomotion  Ambulation Ambulation: Yes Ambulation/Gait Assistance: 5: Supervision Ambulation Distance (Feet): 300 Feet Assistive device: Rolling walker Ambulation/Gait Assistance Details: Verbal cues for precautions/safety;Verbal cues for technique Gait Gait: Yes Gait Pattern: Decreased step length - right;Decreased step length - left;Trunk flexed;Narrow base of support;Shuffle Stairs / Additional  Locomotion Stairs: Yes Stairs Assistance: 4: Min guard;5: Supervision Stairs Assistance Details: Verbal cues for technique;Verbal cues for safe use of DME/AE;Verbal cues for precautions/safety   Balance Balance Balance Assessed: Yes Static Sitting Balance Static Sitting - Balance Support: Feet supported Static Sitting - Level of Assistance: 6: Modified independent (Device/Increase time) Dynamic Sitting Balance Dynamic Sitting - Balance Support: Feet supported;During functional activity Dynamic Sitting - Level of Assistance: 6: Modified independent (Device/Increase time) Static Standing Balance Static Standing - Balance Support: During functional activity Static Standing - Level of Assistance: 6: Modified independent (Device/Increase  time) Dynamic Standing Balance Dynamic Standing - Balance Support: During functional activity;Right upper extremity supported;Left upper extremity supported Dynamic Standing - Level of Assistance: 5: Stand by assistance       Patient and daughter present for family training/education. Patient requesting to discharge early and need to review training with daughter to discharge. Discussed and educated on Spinal precautions patient and daughter verbalized understanding and patient requires cues fr recall and demonstration throughout session from therapist and daughter. Daughter able to provide cues for maintain precautions at end of session. Reviewed and demonstrated functional transfers, ambulation, car transfer and stair negotiation with daughter and patient. Demonstration provided first for all techniques for daughter to observe and then perform hands on assistance. Daughter able to return demonstration and provide appropriate verbal cues for transfers, car transfer, and ambulation as well as stair negotiation for home environment. Patient and daughter educated to perform stair negotiation with use of RW and daughter positioned staggered behind patient to best  simulate home environment. Daughter provided min assist for stair negotiation. Daughter able to return demonstration and provide appropriate safety cues independently. Patient declined further treatment and training at this time.   See Function Navigator for Current Functional Status.  Retta Diones 10/23/2015, 12:52 PM

## 2015-10-23 NOTE — Progress Notes (Signed)
Social Work Patient ID: Danny Patterson, male   DOB: 10-24-1945, 70 y.o.   MRN: [id number]   Met with pt and daughter who is here for family training. He really wants to go home today after this is finished. Have gotten equipment form therapists. Have made referral to [facility] for rolling walker and wide bedside commode to be delivered to room prior to discharge. Also prefer to use [facility] for home health follow up due to wife has used them after her surgery. Pt much happier knowing he is going home today. Daughter will go through PT & OT with pt prior to discharge.

## 2015-10-23 NOTE — Discharge Instructions (Signed)
Inpatient Rehab Discharge Instructions  Danny Patterson Discharge date and time:  10/31/15  Activities/Precautions/ Functional Status: Activity: No lifting, driving, or strenuous exercise till cleared by MD. No bending, twisting or arching.  Diet: diabetic diet Wound Care: keep wound clean and dry. Contact MD if you develop any problems with your incision/wound--redness, swelling, increase in pain, drainage or if you develop fever or chills.   Functional status:  ___ No restrictions     ___ Walk up steps independently _X__ 24/7 supervision/assistance   ___ Walk up steps with assistance ___ Intermittent supervision/assistance  ___ Bathe/dress independently ___ Walk with walker     _X__ Bathe/dress with assistance ___ Walk Independently    ___ Shower independently ___ Walk with assistance    ___ Shower with assistance _X__ No alcohol     ___ Return to work/school ________  Special Instructions: 1. No bending, twisting or arching. No driving till cleared by MD. 2. Wear support stocking daily to keep swelling under control. 3.  Drink plenty of fluids.  4. Take additional dose of colchicine with supper and at bedtime today as per protocol.    COMMUNITY REFERRALS UPON DISCHARGE:    Home Health:   PT, OT, RN    Agency:[facility]   [phone]   Date of last service:10/23/2015  Medical Equipment/Items Ordered:WIDE BEDSIDE COMMODE & Levan Hurst  Agency/Supplier:[facility]   [phone]   FOR SAFETY, 24 HOUR SUPERVISION IS RECOMMENDED UNTIL OTHERWISE DIRECTED BY A PHYSICIAN.  ABSOLUTELY NO DRIVING UNTIL OTHERWISE DIRECTED BY A PHYSICIAN. CAR KEYS SHOULD BE HIDDEN IN A SAFE LOCATION IF NEEDED.  DUE TO RISK OF INJURY, PLEASE REMOVE GUNS, KNIVES, OR ANY OTHER POTENTIALLY DANGEROUS OBJECTS AND HAZARDOUS MATERIALS FROM THE HOME.   My questions have been answered and I understand these instructions. I will adhere to these goals and the provided educational materials after  my discharge from the hospital.  Patient/Caregiver Signature _______________________________ Date __________  Clinician Signature _______________________________________ Date __________  Please bring this form and your medication list with you to all your follow-up doctor's appointments.

## 2015-10-23 NOTE — Progress Notes (Signed)
Social Work  Discharge Note  The overall goal for the admission was met for:   Discharge location: Yes - home with wife and daughter to provide care  Length of Stay: Yes - 9 days  Discharge activity level: Yes - supervision  Home/community participation: Yes  Services provided included: MD, RD, PT, OT, SLP, RN, TR, Pharmacy, Neuropsych and SW  Financial Services: Private Insurance: [employer]  Follow-up services arranged: Home Health: RN, PT, OT via [facility], DME: rolling walker, 3n1 commode via [place] and Patient/Family has no preference for HH/DME agencies  Comments (or additional information):  Patient/Family verbalized understanding of follow-up arrangements: Yes  Individual responsible for coordination of the follow-up plan: pt  Confirmed correct DME delivered: Demitria Hay 10/23/2015    Adalee Kathan

## 2015-10-23 NOTE — Progress Notes (Signed)
Speech Language Pathology Daily Session Note  Patient Details  Name: Danny Patterson MRN: [id number] Date of Birth: Jan 11, 1946  Today's Date: 10/23/2015 SLP Individual Time: 1010-1100 SLP Individual Time Calculation (min): 50 min   Short Term Goals: Week 1: SLP Short Term Goal 1 (Week 1): Patient will utilize external memory aids to recall new, daily information with Mod  A verbal and visual cues.  SLP Short Term Goal 2 (Week 1): Patient will demonstrate functional problem solving for basic and familiar tasks with Mod A verbal and visual cues.  SLP Short Term Goal 3 (Week 1): Patient will identify 2 cognitive deficits with Mod A quesiton cues.  SLP Short Term Goal 4 (Week 1): Patient will utilize call bell to request assistance in 50% of observable opportunities with Mod A question cues.   Skilled Therapeutic Interventions: Skilled treatment session focused on completion of patient and family education with the patient's daughter. Both were educated in regards to patient's current cognitive impairments and strategies to utilize at home to maximize patient's  recall/carryover of information and overall safety. Both were also educated on the importance of 24 hour supervision to maximize patient's safety and ability to make safe decisions. Both verbalized understanding of all information and handouts were also given to reinforce information. Patient demonstrated decreased frustration tolerance and reported he would like to leave today after therapy instead of tomorrow morning. CSW, PA and clinicians made aware. Patient left upright in wheelchair with daughter present. Continue with current plan of care.   Function:  Cognition Comprehension Comprehension assist level: Understands complex 90% of the time/cues 10% of the time  Expression   Expression assist level: Expresses complex 90% of the time/cues < 10% of the time  Social Interaction Social Interaction assist level: Interacts appropriately 90% of  the time - Needs monitoring or encouragement for participation or interaction.  Problem Solving Problem solving assist level: Solves basic 75 - 89% of the time/requires cueing 10 - 24% of the time  Memory Memory assist level: Recognizes or recalls 50 - 74% of the time/requires cueing 25 - 49% of the time    Pain Pain Assessment Pain Assessment: 0-10 Pain Score: 6  Pain Type: Chronic pain Pain Location: Back Pain Orientation: Lower Pain Descriptors / Indicators: Aching Pain Frequency: Constant Pain Onset: On-going Patients Stated Pain Goal: 2 Pain Intervention(s): Medication (See eMAR);Repositioned Multiple Pain Sites: No  Therapy/Group: Individual Therapy  Teondre Jarosz 10/23/2015, 12:12 PM

## 2015-10-28 ENCOUNTER — Ambulatory Visit: Payer: PPO | Admitting: Family Medicine

## 2015-10-29 DIAGNOSIS — Z4789 Encounter for other orthopedic aftercare: Secondary | ICD-10-CM | POA: Diagnosis not present

## 2015-10-29 DIAGNOSIS — Z981 Arthrodesis status: Secondary | ICD-10-CM | POA: Diagnosis not present

## 2015-10-29 DIAGNOSIS — R2689 Other abnormalities of gait and mobility: Secondary | ICD-10-CM | POA: Diagnosis not present

## 2015-10-29 DIAGNOSIS — F0280 Dementia in other diseases classified elsewhere without behavioral disturbance: Secondary | ICD-10-CM | POA: Diagnosis not present

## 2015-10-29 DIAGNOSIS — G3183 Dementia with Lewy bodies: Secondary | ICD-10-CM | POA: Diagnosis not present

## 2015-10-29 DIAGNOSIS — M6281 Muscle weakness (generalized): Secondary | ICD-10-CM | POA: Diagnosis not present

## 2015-10-29 DIAGNOSIS — I69398 Other sequelae of cerebral infarction: Secondary | ICD-10-CM | POA: Diagnosis not present

## 2015-10-29 DIAGNOSIS — I69318 Other symptoms and signs involving cognitive functions following cerebral infarction: Secondary | ICD-10-CM | POA: Diagnosis not present

## 2015-10-29 DIAGNOSIS — Z7984 Long term (current) use of oral hypoglycemic drugs: Secondary | ICD-10-CM | POA: Diagnosis not present

## 2015-10-29 DIAGNOSIS — E119 Type 2 diabetes mellitus without complications: Secondary | ICD-10-CM | POA: Diagnosis not present

## 2015-10-29 DIAGNOSIS — Z9181 History of falling: Secondary | ICD-10-CM | POA: Diagnosis not present

## 2015-10-29 DIAGNOSIS — I10 Essential (primary) hypertension: Secondary | ICD-10-CM | POA: Diagnosis not present

## 2015-10-30 DIAGNOSIS — M5416 Radiculopathy, lumbar region: Secondary | ICD-10-CM | POA: Diagnosis not present

## 2015-11-03 ENCOUNTER — Telehealth: Payer: Self-pay | Admitting: *Deleted

## 2015-11-03 DIAGNOSIS — M6281 Muscle weakness (generalized): Secondary | ICD-10-CM | POA: Diagnosis not present

## 2015-11-03 DIAGNOSIS — F0280 Dementia in other diseases classified elsewhere without behavioral disturbance: Secondary | ICD-10-CM | POA: Diagnosis not present

## 2015-11-03 DIAGNOSIS — I69318 Other symptoms and signs involving cognitive functions following cerebral infarction: Secondary | ICD-10-CM | POA: Diagnosis not present

## 2015-11-03 DIAGNOSIS — G3183 Dementia with Lewy bodies: Secondary | ICD-10-CM | POA: Diagnosis not present

## 2015-11-03 DIAGNOSIS — Z9181 History of falling: Secondary | ICD-10-CM | POA: Diagnosis not present

## 2015-11-03 DIAGNOSIS — R2689 Other abnormalities of gait and mobility: Secondary | ICD-10-CM | POA: Diagnosis not present

## 2015-11-03 DIAGNOSIS — E119 Type 2 diabetes mellitus without complications: Secondary | ICD-10-CM | POA: Diagnosis not present

## 2015-11-03 DIAGNOSIS — I10 Essential (primary) hypertension: Secondary | ICD-10-CM | POA: Diagnosis not present

## 2015-11-03 DIAGNOSIS — Z7984 Long term (current) use of oral hypoglycemic drugs: Secondary | ICD-10-CM | POA: Diagnosis not present

## 2015-11-03 DIAGNOSIS — Z981 Arthrodesis status: Secondary | ICD-10-CM | POA: Diagnosis not present

## 2015-11-03 DIAGNOSIS — I69398 Other sequelae of cerebral infarction: Secondary | ICD-10-CM | POA: Diagnosis not present

## 2015-11-03 DIAGNOSIS — Z4789 Encounter for other orthopedic aftercare: Secondary | ICD-10-CM | POA: Diagnosis not present

## 2015-11-03 NOTE — Telephone Encounter (Signed)
Pt's daughter called for clarification regarding her fathers upcoming appt with Dr. Allena KatzPatel. Patient had surgery on his back performede by Dr. Jeral FruitBotero.  He is already receiving therapy orders from Dr. Jeral FruitBotero for outpatient treatment.  They are asking if seeing Dr. Allena KatzPatel at this point is redundant. I spoke with Sybil and agreed that they are not obligated to be seen by us.  I advised the pt's daughter to call Dr. Cassandria SanteeBotero's office and see how they feel about us seeing the patient as well.  If there office feels it is not important at this point we can cancel and reschedule if necessary. Otherwise, we'll keep the appointment set and see them on 11/06/2015.  They said they would call back ASAP to let us know what Dr. Cassandria SanteeBotero's office said

## 2015-11-04 DIAGNOSIS — F4321 Adjustment disorder with depressed mood: Secondary | ICD-10-CM

## 2015-11-04 NOTE — Consult Note (Signed)
PSYCHODIAGNOSTIC EVALUATION - [facility]   Mr. Danny Patterson is a 70 year old, Caucasian man, who was seen for an initial psychodiagnostic examination to assess for potential depression, anxiety or other mental illness in the setting of stroke and possible dementia with Lewy Bodies.    During the current session, Danny Patterson was supposed to undergo a brief neuropsychological evaluation, but after initial presentation of the first item, he refused to continue, stating, "I hate to be rude to you, but I'm just not doing any of this right now."  He was, however, agreeable to participating in the clinical interview.  During the interview, he reported that he has not noticed cognitive changes and does not recall being told by family members that his cognitive functioning has changed.  He acknowledged depressed mood and frequently complained that no one is helping to manage his pain, which he said was at a level "10, if not 20 [out of 10]" currently.  He denied suicidal/homicidal ideation.  He stated that he felt as though most of his current depressed mood was being driven by being in the hospital and not feeling as though his pain was well-managed; he was unsure if he had experienced depression previously in his life.  He reported poor sleep, but his appetite has remained stable.  He also acknowledged that his mood has been irritable and again attributed his mood reaction to pain level.  He was unable to engage in a discussion about his leisure activities, stating that he enjoyed "bout like everybody else likes to do."  He stated that he has sufficient social support.  Of note, he firmly expressed that he is being pushed too hard during physical therapy and that he does not want to be in the hospital anymore and at one point said that he was going to discharge himself today and "hitchhike" home.    IMPRESSION:  Danny Patterson seems to be experiencing depressed mood as an adjustment  reaction to his current situation.  His depression is likely being exacerbated by pain level, which he perceives to be severe.  His physician could consider whether alterations to his medication regimen would be medically indicated to improve his mood temporarily, though this decision should probably wait until after discharge when it is able to be determined if his mood improves after going home.  From a cognitive perspective, Danny Patterson is encouraged to follow-up with a neuropsychologist as an outpatient to clarify his possible diagnosis of dementia with Lewy bodies and to provide recommendations for care management.    DIAGNOSIS:   Adjustment disorder with depressed mood  Leavy CellaKaren Maclaine Ahola, Psy.D.  Clinical Neuropsychologist

## 2015-11-05 ENCOUNTER — Encounter: Payer: Self-pay | Admitting: Physical Therapy

## 2015-11-05 ENCOUNTER — Ambulatory Visit: Payer: PPO | Attending: Neurosurgery | Admitting: Physical Therapy

## 2015-11-05 DIAGNOSIS — R262 Difficulty in walking, not elsewhere classified: Secondary | ICD-10-CM | POA: Diagnosis not present

## 2015-11-05 DIAGNOSIS — M6281 Muscle weakness (generalized): Secondary | ICD-10-CM | POA: Diagnosis not present

## 2015-11-05 DIAGNOSIS — M545 Low back pain: Secondary | ICD-10-CM | POA: Insufficient documentation

## 2015-11-06 ENCOUNTER — Ambulatory Visit (INDEPENDENT_AMBULATORY_CARE_PROVIDER_SITE_OTHER): Payer: PPO | Admitting: Family Medicine

## 2015-11-06 ENCOUNTER — Encounter: Payer: Self-pay | Admitting: Family Medicine

## 2015-11-06 ENCOUNTER — Encounter: Payer: PPO | Admitting: Physical Medicine & Rehabilitation

## 2015-11-06 VITALS — BP 135/77 | HR 80 | Temp 98.8°F | Resp 16 | Ht 71.0 in | Wt 225.0 lb

## 2015-11-06 DIAGNOSIS — D6489 Other specified anemias: Secondary | ICD-10-CM

## 2015-11-06 DIAGNOSIS — I10 Essential (primary) hypertension: Secondary | ICD-10-CM

## 2015-11-06 DIAGNOSIS — G9340 Encephalopathy, unspecified: Secondary | ICD-10-CM | POA: Diagnosis not present

## 2015-11-06 DIAGNOSIS — E0800 Diabetes mellitus due to underlying condition with hyperosmolarity without nonketotic hyperglycemic-hyperosmolar coma (NKHHC): Secondary | ICD-10-CM

## 2015-11-06 DIAGNOSIS — E876 Hypokalemia: Secondary | ICD-10-CM

## 2015-11-06 DIAGNOSIS — R600 Localized edema: Secondary | ICD-10-CM

## 2015-11-06 DIAGNOSIS — R7309 Other abnormal glucose: Secondary | ICD-10-CM | POA: Diagnosis not present

## 2015-11-06 DIAGNOSIS — Z9889 Other specified postprocedural states: Secondary | ICD-10-CM | POA: Diagnosis not present

## 2015-11-06 LAB — COMPLETE METABOLIC PANEL WITH GFR
ALBUMIN: 3.7 g/dL (ref 3.6–5.1)
ALT: 12 U/L (ref 9–46)
AST: 19 U/L (ref 10–35)
Alkaline Phosphatase: 98 U/L (ref 40–115)
BILIRUBIN TOTAL: 0.4 mg/dL (ref 0.2–1.2)
BUN: 20 mg/dL (ref 7–25)
CO2: 26 mmol/L (ref 20–31)
Calcium: 9.4 mg/dL (ref 8.6–10.3)
Chloride: 103 mmol/L (ref 98–110)
Creat: 1.08 mg/dL (ref 0.70–1.18)
GFR, EST AFRICAN AMERICAN: 80 mL/min (ref >=60)
GFR, EST NON AFRICAN AMERICAN: 69 mL/min (ref >=60)
Glucose, Bld: 80 mg/dL (ref 65–99)
POTASSIUM: 4.8 mmol/L (ref 3.5–5.3)
Sodium: 139 mmol/L (ref 135–146)
TOTAL PROTEIN: 6.4 g/dL (ref 6.1–8.1)

## 2015-11-06 LAB — CBC WITH DIFFERENTIAL/PLATELET
BASOS ABS: 0 {cells}/uL (ref 0–200)
Basophils Relative: 0 %
EOS ABS: 365 {cells}/uL (ref 15–500)
Eosinophils Relative: 5 %
HEMATOCRIT: 34.3 % — AB (ref 38.5–50.0)
HEMOGLOBIN: 11.1 g/dL — AB (ref 13.2–17.1)
LYMPHS ABS: 1898 {cells}/uL (ref 850–3900)
LYMPHS PCT: 26 %
MCH: 28.9 pg (ref 27.0–33.0)
MCHC: 32.4 g/dL (ref 32.0–36.0)
MCV: 89.3 fL (ref 80.0–100.0)
MONO ABS: 584 {cells}/uL (ref 200–950)
MPV: 10.4 fL (ref 7.5–12.5)
Monocytes Relative: 8 %
NEUTROS PCT: 61 %
Neutro Abs: 4453 cells/uL (ref 1500–7800)
Platelets: 189 10*3/uL (ref 140–400)
RBC: 3.84 MIL/uL — AB (ref 4.20–5.80)
RDW: 14.7 % (ref 11.0–15.0)
WBC: 7.3 10*3/uL (ref 3.8–10.8)

## 2015-11-06 LAB — HEMOGLOBIN A1C
Hgb A1c MFr Bld: 5.3 % (ref <5.7)
Mean Plasma Glucose: 105 mg/dL

## 2015-11-06 MED ORDER — FUROSEMIDE 20 MG PO TABS: 20.0000 mg | ORAL_TABLET | Freq: Every day | ORAL | 6 refills | Status: DC | PRN

## 2015-11-06 NOTE — Therapy (Signed)
[facility] [address] [place], [name], [address] Phone: [phone]   Fax:  [phone]  Physical Therapy Evaluation  Patient Details  Name: Danny Patterson MRN: [id number] Date of Birth: Feb 07, 1946 Referring Provider: Karn CassisBotero, Ernesto M. MD  Encounter Date: 11/05/2015      PT End of Session - 11/05/15 1635    Visit Number 1   Number of Visits 12   Date for PT Re-Evaluation 12/17/15   Authorization Type 1   Authorization Time Period 10 (G code)   PT Start Time 1535   PT Stop Time 1633   PT Time Calculation (min) 58 min   Equipment Utilized During Treatment Back brace   Activity Tolerance Patient tolerated treatment well   Behavior [facility] for tasks assessed/performed      Past Medical History:  Diagnosis Date  . Allergy   . Arthritis   . Diabetes mellitus without complication (HCC)   . Gout   . Hyperlipidemia   . Hypertension   . Kidney stone   . Rheumatic fever     Past Surgical History:  Procedure Laterality Date  . BACK SURGERY    . knot     removed from neck  . POSTERIOR LUMBAR FUSION 4 LEVEL N/A 10/01/2015   Procedure: Lumbar three-four,  Lumbar four-five,  Lumbar five-Sacrum one posterior lumbar interbody fusion with Laminotomy at Lumbar Two-Three;  Surgeon: Hilda LiasErnesto Botero, MD;  Location: [facility];  Service: Neurosurgery;  Laterality: N/A;    There were no vitals filed for this visit.       Subjective Assessment - 11/05/15 1600    Subjective Patient reports his back feels weak with standing and walking. He has more weakness in  Left LE since surgery; prior to surgery he had more right sided weakness with right foot drop. He is unable to sleep in bed and therefore is resting in recliner at home.   Pertinent History Patient reports prior back surgery for herniated disc ~2000 and he did well for awhile and the began episodes of back pain and began to have weakness in LE's until  the point he had difficulty with getting up from a squatting position. He noticed a significant decline in function over the past year and began to have difficulty with walking and picking up right foot (foot drag) with walking on uneven surfaces. He underwent surgery 10/01/2015 and has been in a back brace and using a rolling walker to get around since. He feels better sitting or lying down and his back feels weak with standing/walking.   Limitations Lifting;Standing;Walking;House hold activities;Other (comment)  work unable   Patient Stated Goals get back to full function for work and be able to walk without AD   Currently in Pain? Yes   Pain Score 3 (pain ranges from 2/10 up to 10/10)   Pain Location Back   Pain Orientation Lower   Pain Descriptors / Indicators Aching;Tightness;Tingling;Other (Comment)  hypersensitive in left thigh   Pain Type Acute pain;Surgical pain  10/01/2015   Pain Onset More than a month ago   Pain Frequency Constant   Aggravating Factors  standing, walking   Pain Relieving Factors sitting, resting   Effect of Pain on Daily Activities unable to perform daily tasks without difficulty             [facility] Assessment - 11/05/15 1550      Assessment   Medical Diagnosis M54.16 Radiculopathy, lumbar  region   Referring Provider Karn Cassis MD   Onset Date/Surgical Date 10/01/15  lumbar fusion 4 levels, laminotomy L2-3   Hand Dominance Right   Next MD Visit 6 weeks   Prior Therapy in hospital PT x 1 week      Precautions   Precautions Back   Precaution Comments no lifting, bending, twisting   Required Braces or Orthoses Spinal Brace     Restrictions   Weight Bearing Restrictions No   Other Position/Activity Restrictions no bending, twiesting, lifting     Home Environment   Living Environment Private residence   Living Arrangements Spouse/significant other   Home Access Stairs to enter   Entrance Stairs-Number of Steps 2   Entrance Stairs-Rails  None   Home Layout One level   Home Equipment Walker - 2 wheels;Shower seat;Bedside commode;Grab bars - toilet;Grab bars - tub/shower     Prior Function   Level of Independence Independent   Vocation Full time employment   Banker, refrigeration   Leisure yard work and work around the home     Cognition   Overall Cognitive Status Within Functional Limits for tasks assessed      Objective: Gait: ambulating with forward wheeled walker with forward flexed posture, back brace in place, short step length Transfers: sit to stand independently using UE support AROM/Strength: grossly tested with patient in sitting position: decreased hip flexion, abduction bilaterally, decreased knee extension and flexion 4-/5 bilaterally, ankle DF/PF AROM WNL's Sensation: left thigh reported impaired sensation as compared to right LE  Outcome measure: modified oswestry 62% (severe self perceived disability: in need of intervention)  Treatment: Therapeutic exercise:patient performed exercises with guidance, verbal and tactile cues and demonstration of PT: Sitting:  Ball rollouts underfoot with controlled motion knee flexion/extension x 10 reps Hip adduction with glute sets using ball between knees x 10 reps Hip abduction without resistance x 15 reps glute sets with instruction for sitting, lying or standing positions Hip fall outs: instructed with written instructions Reviewed precautions of no bending, twisting or lifting and limited sitting for s/p fusion of lumbar spine   Patient response to treatment: patient verbalized understanding of home program and was able to perform exercises with moderate verbal cues and demonstration; demonstrated improved gait pattern with more erect posture following session.         PT Education - 11/05/15 1700    Education provided Yes   Education Details HEP: seated hip adduction, glute sets, hip abduction in sitting no resistance, bent  knee fall outs supine hook lying, all with TrA control   Person(s) Educated Patient   Methods Explanation;Demonstration;Verbal cues;Handout   Comprehension Verbalized understanding;Returned demonstration;Verbal cues required             PT Long Term Goals - 11/05/15 1700      PT LONG TERM GOAL #1   Title Patient will be able to walk safely, erect posture with appropriate AD on level surfaces >600' and stairs by 12/05/2015    Baseline using rolling walker (front wheeled) on level surfaces short distances with forward flexed posture   Status New     PT LONG TERM GOAL #2   Title Patient will demonstrate improved function for daily activities with mild pain as indicated by modified oswestry score of 40% or less by 12/17/2015   Baseline MODI 62% (severe self perceived disability)   Status New     PT LONG TERM GOAL #3   Title Patient will demonstrate functional  community ambulation and low fall rist by 10MW of 10 seconds or less by 12/17/2015   Baseline to be assessed next session   Status New     PT LONG TERM GOAL #4   Title Patient will be independent with home program for exercises and self management by 12/17/2015 to continue with progressive healing once discharged from physical therapy    Baseline limited/no knowledge of appropriate exercises and progression    Status New               Plan - 11/05/15 1635    Clinical Impression Statement Patient is a 70 yo male who presents with back pain, weakness in core, LE's s/p lumbar fusion 10/01/2015. He is limited in all daily tasks for personal care and household chores and is unable to work at this time due to limitations of surgery. He has limited knowledge of appropriate exercises and progression and will benefit from physical therapy intervention to address limitations in order for pt. to return to prior level of function.    Rehab Potential Good   Clinical Impairments Affecting Rehab Potential (+) acute condition (surgery),  family support, motivated (-) chronic condition of back pain/weakness prior to sx, age   PT Frequency 2x / week   PT Duration 6 weeks   PT Treatment/Interventions Patient/family education;Gait training;Cryotherapy;Therapeutic activities;Therapeutic exercise;Neuromuscular re-education;Scar mobilization;Manual techniques;Moist Heat   PT Next Visit Plan progress exercises, core control, gait training   PT Home Exercise Plan core control, LE exercises   Consulted and Agree with Plan of Care Patient      Patient will benefit from skilled therapeutic intervention in order to improve the following deficits and impairments:  Decreased strength, Decreased activity tolerance, Decreased knowledge of precautions, Pain, Decreased endurance, Decreased range of motion, Difficulty walking, Increased muscle spasms  Visit Diagnosis: Muscle weakness (generalized) - Plan: PT plan of care cert/re-cert  Difficulty in walking, not elsewhere classified - Plan: PT plan of care cert/re-cert  Bilateral low back pain, with sciatica presence unspecified - Plan: PT plan of care cert/re-cert      G-Codes - 11/05/15 1700    Functional Assessment Tool Used MODI, pain scale, ROM, strength deficits   Functional Limitation Mobility: Walking and moving around   Mobility: Walking and Moving Around Current Status ([id number]) At least 60 percent but less than 80 percent impaired, limited or restricted   Mobility: Walking and Moving Around Goal Status [phone]([id number]) At least 20 percent but less than 40 percent impaired, limited or restricted       Problem List Patient Active Problem List   Diagnosis Date Noted  . Adjustment disorder with depressed mood   . Osteoarthritis of spine with radiculopathy, lumbar region 10/15/2015  . Abnormal MRI   . Slurred speech   . Surgery, elective   . Loose stools   . OSA (obstructive sleep apnea)   . Acute blood loss anemia   . Pneumonia   . Urinary retention   . Lewy body dementia   .  Encephalopathy   . Altered mental status   . Acute encephalopathy 10/12/2015  . Hypokalemia 10/12/2015  . Anemia 10/12/2015  . S/P lumbar fusion 10/12/2015  . Acute respiratory failure (HCC)   . Degenerative disc disease, lumbar 10/01/2015  . Sciatica of right side associated with disorder of lumbosacral spine 07/21/2015  . Hypertension 02/18/2015  . Diabetes (HCC) 02/18/2015  . Gout 02/18/2015  . Hyperlipidemia 02/18/2015  . Arthritis 02/18/2015  . Encounter to establish care 02/18/2015  Beacher May PT 11/06/2015, 9:07 PM  [facility] PHYSICAL AND SPORTS MEDICINE [address] [place], [address] Phone: [phone]   Fax:  [phone]  Name: Danny Patterson MRN: [id number] Date of Birth: 06/10/1945

## 2015-11-07 ENCOUNTER — Ambulatory Visit: Payer: PPO | Admitting: Physical Therapy

## 2015-11-07 ENCOUNTER — Ambulatory Visit: Payer: Self-pay | Admitting: Family Medicine

## 2015-11-07 DIAGNOSIS — M5416 Radiculopathy, lumbar region: Secondary | ICD-10-CM

## 2015-11-07 NOTE — Patient Instructions (Addendum)
Order only not office visit.

## 2015-11-07 NOTE — Progress Notes (Signed)
This was not an office visit. Patient was seen by provider 11/06/15. There was some trouble completing note. This is order only encounter.

## 2015-11-10 ENCOUNTER — Encounter: Payer: Self-pay | Admitting: Family Medicine

## 2015-11-10 ENCOUNTER — Ambulatory Visit: Payer: PPO | Admitting: Physical Therapy

## 2015-11-10 ENCOUNTER — Encounter: Payer: Self-pay | Admitting: Physical Therapy

## 2015-11-10 DIAGNOSIS — R262 Difficulty in walking, not elsewhere classified: Secondary | ICD-10-CM

## 2015-11-10 DIAGNOSIS — M545 Low back pain: Secondary | ICD-10-CM

## 2015-11-10 DIAGNOSIS — M6281 Muscle weakness (generalized): Secondary | ICD-10-CM

## 2015-11-10 NOTE — Progress Notes (Signed)
Name: Danny Patterson   MRN: [id number]    DOB: December 02, 1945   Date:11/10/2015       Progress Note  Subjective  Chief Complaint  Chief Complaint  Patient presents with  . Follow-up    surgery    HPI Patient here after hospitalization  For lumbar surgery.  Has respiratory failure and neurologic complications and was in ICU for 2 weeks.  Then sent to Rehab center where some PT was started.  He needs more PT as his back is still stiff and sore.  He is walking with a walker.  He is having more leg swelling but stopped his Lasix bercause he felt that it was causing some problems, but unsure what now.    He is diabetic and his sugars have not been checked well in follow up.  Has HBP and seems to be taking his Amlodipine.  Taking Atorvastatin he thinks and has gout flair med (colchicine) that he takes as needed.   Her is a little more forgetful than he has been in the past, but I can identify no acute signs of dementia at this time,.  He has seen a Neurologist while in the hospital   No problem-specific Assessment & Plan notes found for this encounter.   Past Medical History:  Diagnosis Date  . Allergy   . Arthritis   . Diabetes mellitus without complication (HCC)   . Gout   . Hyperlipidemia   . Hypertension   . Kidney stone   . Rheumatic fever     Social History  Substance Use Topics  . Smoking status: Never Smoker  . Smokeless tobacco: Never Used  . Alcohol use 1.8 oz/week    3 Cans of beer per week     Current Outpatient Prescriptions:  .  acetaminophen (TYLENOL) 325 MG tablet, Take 2 tablets (650 mg total) by mouth 4 (four) times daily -  with meals and at bedtime., Disp: , Rfl:  .  amLODipine (NORVASC) 10 MG tablet, Take 0.5 tablets (5 mg total) by mouth daily., Disp: 30 tablet, Rfl: 1 .  atorvastatin (LIPITOR) 20 MG tablet, Take 1 tablet (20 mg total) by mouth daily at 6 PM., Disp: 30 tablet, Rfl: 0 .  colchicine 0.6 MG tablet, Take 1 tablet (0.6 mg total) by mouth as needed  (3 on day one of flare then one daily after.)., Disp: , Rfl:  .  metFORMIN (GLUCOPHAGE) 500 MG tablet, Take 1 tablet each AM with breakfast. (Patient taking differently: Take 500 mg by mouth daily with breakfast. Take 1 tablet each AM with breakfast.), Disp: 90 tablet, Rfl: 3 .  potassium chloride SA (K-DUR,KLOR-CON) 20 MEQ tablet, Take 1 tablet (20 mEq total) by mouth daily., Disp: 30 tablet, Rfl: 0 .  furosemide (LASIX) 20 MG tablet, Take 1 tablet (20 mg total) by mouth daily as needed., Disp: 30 tablet, Rfl: 6 .  magnesium gluconate (MAGONATE) 500 MG tablet, Take 500 mg by mouth daily., Disp: , Rfl:  .  methocarbamol (ROBAXIN) 500 MG tablet, Take 1 tablet (500 mg total) by mouth 3 (three) times daily. For muscle aches/spasms (Patient not taking: Reported on 11/06/2015), Disp: 90 tablet, Rfl: 0  Allergies  Allergen Reactions  . Ace Inhibitors Other (See Comments)    Angioedema.    Review of Systems  Constitutional: Negative for chills, fever, malaise/fatigue and weight loss.  HENT: Negative for hearing loss.   Eyes: Negative for blurred vision and double vision.  Respiratory: Negative for cough, shortness  of breath and wheezing.   Cardiovascular: Positive for leg swelling. Negative for chest pain and palpitations.  Gastrointestinal: Negative for abdominal pain, blood in stool and heartburn.  Genitourinary: Negative for dysuria, frequency and urgency.  Musculoskeletal: Positive for back pain.  Skin: Negative for rash.  Neurological: Negative for dizziness, tremors, weakness and headaches.      Objective  Vitals:   11/06/15 1412  BP: 135/77  Pulse: 80  Resp: 16  Temp: 98.8 F (37.1 C)  TempSrc: Oral  Weight: 225 lb (102.1 kg)  Height: 5\' 11"  (1.803 m)     Physical Exam  Constitutional: He is oriented to person, place, and time and well-developed, well-nourished, and in no distress. No distress.  Walks with a walker.  HENT:  Head: Normocephalic and atraumatic.  Eyes:  Conjunctivae and EOM are normal. Pupils are equal, round, and reactive to light. No scleral icterus.  Neck: Normal range of motion. Neck supple. Carotid bruit is not present. No thyromegaly present.  Cardiovascular: Normal rate, regular rhythm and normal heart sounds.  Exam reveals no gallop and no friction rub.   No murmur heard. Pulmonary/Chest: Effort normal and breath sounds normal. No respiratory distress. He has no wheezes. He has no rales.  Abdominal: Soft. There is no tenderness.  Musculoskeletal: He exhibits edema (1+ pittting edema of R leg and trace pitting edema of L leg.).  Lymphadenopathy:    He has no cervical adenopathy.  Neurological: He is alert and oriented to person, place, and time.  Vitals reviewed.        Assessment & Plan  1. Essential hypertension Cont current meds - COMPLETE METABOLIC PANEL WITH GFR - HgB [id number]  2. Diabetes mellitus due to underlying condition with hyperosmolarity without coma, without long-term current use of insulin (HCC) Cont current meds  3. Acute encephalopathy ? Cause .  Seems to be resolved  4. Hypokalemia  Cont K+ 5. Pedal edema  - furosemide (LASIX) 20 MG tablet; Take 1 tablet (20 mg total) by mouth daily as needed.  Dispense: 30 tablet; Refill: 6-restart  6. History of back surgery PT reordered.  7. Anemia due to other cause  - CBC with Differential

## 2015-11-10 NOTE — Therapy (Signed)
[facility] [address], [place], [address] Phone: [phone]   Fax:  [phone]  Physical Therapy Treatment  Patient Details  Name: Danny Patterson MRN: [id number] Date of Birth: [birth year] Referring Provider: Karn Cassis MD  Encounter Date: 11/10/2015      PT End of Session - 11/10/15 1700    Visit Number 2   Number of Visits 12   Date for PT Re-Evaluation 12/17/15   Authorization Type 2   Authorization Time Period 10 (G code)   PT Start Time 1614   PT Stop Time 1700   PT Time Calculation (min) 46 min   Equipment Utilized During Treatment Back brace   Activity Tolerance Patient tolerated treatment well;Other (comment)  limited by weakness/s/p surgery limitations   Behavior During Therapy [facility] for tasks assessed/performed      Past Medical History:  Diagnosis Date  . Allergy   . Arthritis   . Diabetes mellitus without complication (HCC)   . Gout   . Hyperlipidemia   . Hypertension   . Kidney stone   . Rheumatic fever     Past Surgical History:  Procedure Laterality Date  . BACK SURGERY    . knot     removed from neck  . POSTERIOR LUMBAR FUSION 4 LEVEL N/A 10/01/2015   Procedure: Lumbar three-four,  Lumbar four-five,  Lumbar five-Sacrum one posterior lumbar interbody fusion with Laminotomy at Lumbar Two-Three;  Surgeon: Hilda Lias, MD;  Location: [facility];  Service: Neurosurgery;  Laterality: N/A;    There were no vitals filed for this visit.      Subjective Assessment - 11/10/15 1617    Subjective Patient reports he is trying to get up and to move around the house and his back still feels weak with standing and walking.  Left leg is still very weak and feels burning in thigh intermittently and with touching it; he has to help it to get in, out of car (hip flexion weak).   Limitations Lifting;Standing;Walking;House hold activities;Other (comment)   Patient Stated Goals get  back to full function for work and be able to walk without AD   Currently in Pain? Yes   Pain Score 7    Pain Location Back   Pain Orientation Lower   Pain Descriptors / Indicators Aching;Tightness  left thigh feels "raw" and burning to touch   Pain Type Surgical pain;Acute pain  surgery 10/01/2015   Pain Onset More than a month ago   Pain Frequency Constant      Objective: Strength: left hip flexion decreased 3-/5, hip abduction 4-/5, bilateral ankle DF inconsistent ability to perform active motion Gait: ambulating with front wheeled rolling walker  (back brace in place)with more erect posture as compared to previous session  Treatment: Therapeutic exercise: patient performed exercises with guidance, verbal and tactile cues and demonstration of PT: (Note: back brace in place throughout session) Sitting in chair: Core control exercises with ROM LE's Roll ball under foot 25x each LE  Hip adduction with ball with glute sets, verbal cues to perform with correct sequence/technique 15x Hip abduction with isometric manual resistance mild/moderate by PT x 10 reps Active assisted hip flexion left with hold and lower slowly x 5 reps Tap balance stones in front 15x each LE Ankle DF/PF x 15 reps each LE Transferred to treatment table using walker, independent with close supervision for safety:  Sit to stand off high table x 10 reps  with verbal cues to stand with more erect posture and less forward flexion at hips Standing: with walker in front for support/safetly On floor staggered stance weight shift forward and back x 10 with each LE positioned behind with contact guard x 1 for safety On Airex pad: weight shift side to side x 10 reps and forward and back x 10 with verbal cues to perform correctly and contact guard x 1 for safety  Patient response to treatment: improved abiltiy to transfer sit to stand with less difficulty following repetition and with cuing to perform with good alignment,  improved posture with decreased forward trunk/hip flexion following treatment session; required assistance to perform left hip flexion in sitting due to significant weakness, inconsistent performance of left and right ankle DF due to weakness          PT Education - 11/10/15 1700    Education provided Yes   Education Details HEP: re assessed exercises given in sitting for core control    Person(s) Educated Patient   Methods Explanation;Verbal cues;Tactile cues;Demonstration   Comprehension Verbalized understanding;Returned demonstration;Verbal cues required             PT Long Term Goals - 11/05/15 1700      PT LONG TERM GOAL #1   Title Patient will be able to walk safely, erect posture with appropriate AD on level surfaces >600' and stairs by 12/05/2015    Baseline using rolling walker (front wheeled) on level surfaces short distances with forward flexed posture   Status New     PT LONG TERM GOAL #2   Title Patient will demonstrate improved function for daily activities with mild pain as indicated by modified oswestry score of 40% or less by 12/17/2015   Baseline MODI 62% (severe self perceived disability)   Status New     PT LONG TERM GOAL #3   Title Patient will demonstrate functional community ambulation and low fall rist by 10MW of 10 seconds or less by 12/17/2015   Baseline to be assessed next session   Status New     PT LONG TERM GOAL #4   Title Patient will be independent with home program for exercises and self management by 12/17/2015 to continue with progressive healing once discharged from physical therapy    Baseline limited/no knowledge of appropriate exercises and progression    Status New               Plan - 11/10/15 1700    Clinical Impression Statement Patient was able to perform exercises with assistance and verbal/tactile cuing. He demonstrated improved weight shifting and abiltiy to transfer sit to stand with less difficulty follwoing  instruction/demonstration. He continues with limitaitons of weakness, difficulty with walking and transfers and will benefit from additional physical therapy intervention to achieve goals.    Rehab Potential Good   PT Frequency 2x / week   PT Duration 6 weeks   PT Treatment/Interventions Patient/family education;Gait training;Cryotherapy;Therapeutic activities;Therapeutic exercise;Neuromuscular re-education;Scar mobilization;Manual techniques;Moist Heat   PT Next Visit Plan progress exercises, core control, gait training   PT Home Exercise Plan core control, LE exercises      Patient will benefit from skilled therapeutic intervention in order to improve the following deficits and impairments:  Decreased strength, Decreased activity tolerance, Decreased knowledge of precautions, Pain, Decreased endurance, Decreased range of motion, Difficulty walking, Increased muscle spasms  Visit Diagnosis: Muscle weakness (generalized)  Difficulty in walking, not elsewhere classified  Bilateral low back pain, with sciatica presence unspecified  Problem List Patient Active Problem List   Diagnosis Date Noted  . Adjustment disorder with depressed mood   . Osteoarthritis of spine with radiculopathy, lumbar region 10/15/2015  . Abnormal MRI   . Slurred speech   . Surgery, elective   . Loose stools   . OSA (obstructive sleep apnea)   . Acute blood loss anemia   . Pneumonia   . Urinary retention   . Lewy body dementia   . Encephalopathy   . Altered mental status   . Acute encephalopathy 10/12/2015  . Hypokalemia 10/12/2015  . Anemia 10/12/2015  . S/P lumbar fusion 10/12/2015  . Acute respiratory failure (HCC)   . Degenerative disc disease, lumbar 10/01/2015  . Sciatica of right side associated with disorder of lumbosacral spine 07/21/2015  . Hypertension 02/18/2015  . Diabetes (HCC) 02/18/2015  . Gout 02/18/2015  . Hyperlipidemia 02/18/2015  . Arthritis 02/18/2015  . Encounter to  establish care 02/18/2015    Beacher MayBrooks, Marie PT 11/10/2015, 9:52 PM  [facility] [address] [place], [name], [address] Phone: [phone]   Fax:  [phone]  Name: Danny Patterson MRN: [id number] Date of Birth: [birth year]

## 2015-11-12 ENCOUNTER — Ambulatory Visit: Payer: PPO | Admitting: Physical Therapy

## 2015-11-12 ENCOUNTER — Encounter: Payer: Self-pay | Admitting: Physical Therapy

## 2015-11-12 DIAGNOSIS — M545 Low back pain: Secondary | ICD-10-CM

## 2015-11-12 DIAGNOSIS — M6281 Muscle weakness (generalized): Secondary | ICD-10-CM

## 2015-11-12 DIAGNOSIS — R262 Difficulty in walking, not elsewhere classified: Secondary | ICD-10-CM

## 2015-11-12 NOTE — Therapy (Signed)
[facility] [address] [place], [address] Phone: [phone]   Fax:  [phone]  Physical Therapy Treatment  Patient Details  Name: Danny Patterson MRN: [id number] Date of Birth: 28-Sep-1945 Referring Provider: Karn Cassis MD  Encounter Date: 11/12/2015      PT End of Session - 11/12/15 1544    Visit Number 3   Number of Visits 12   Date for PT Re-Evaluation 12/17/15   Authorization Type 3   Authorization Time Period 10 (G code)   PT Start Time 1532   PT Stop Time 1612   PT Time Calculation (min) 40 min   Equipment Utilized During Treatment Back brace   Activity Tolerance Patient tolerated treatment well;Other (comment)  limited by weakness/s/p surgery limitations   Behavior During Therapy [facility] for tasks assessed/performed      Past Medical History:  Diagnosis Date  . Allergy   . Arthritis   . Diabetes mellitus without complication (HCC)   . Gout   . Hyperlipidemia   . Hypertension   . Kidney stone   . Rheumatic fever     Past Surgical History:  Procedure Laterality Date  . BACK SURGERY    . knot     removed from neck  . POSTERIOR LUMBAR FUSION 4 LEVEL N/A 10/01/2015   Procedure: Lumbar three-four,  Lumbar four-five,  Lumbar five-Sacrum one posterior lumbar interbody fusion with Laminotomy at Lumbar Two-Three;  Surgeon: Hilda Lias, MD;  Location: [facility];  Service: Neurosurgery;  Laterality: N/A;    There were no vitals filed for this visit.      Subjective Assessment - 11/12/15 1533    Subjective pt reports he felt great following last treatment session but that he "feels like crap today." He states he did sit <> stand during HEP yesterday and states that may have been the cause.   Limitations Lifting;Standing;Walking;House hold activities;Other (comment)   Patient Stated Goals get back to full function for work and be able to walk without AD   Pain Score 8    Pain  Location Back   Pain Orientation Right   Pain Descriptors / Indicators Constant   Pain Onset More than a month ago     Therex: (pt had back brace on throughout session)  Seated march, 2x10, pt had to assist LLE through full range  Seated iso hip abd, 2x10, min verbal cues for proper technique  Seated LAQ, ball rolls, ABCs with ball, CCW/CW circles with ball with BLE for core initiation, 1x20-30 reps each; min verbal cues to decrease speed; pt required multiple rest breaks throughout  Seated isometric glute sets by pushing foot down into ball, 1x10 (5 sec hold) BLE; min verbal cues for proper technique; pt reported slight increase in back pain with LLE glute set, SPT educated pt to not push as hard and to bring ball closer to him which improved glute contraction and decreased back pain   Sit <> stand from mat (lower level than last session), 1x12; pt demonstrated improved strength and quality from last session.  Standing toe taps on airex (~2" threshold) without UE support, 2x10 each; pt demonstrated LLE fatigue > RLE with exercise; CGA for steadiness  Lateral and A/P weight shifting in bil stance and staggered stance on airex pad with no UE support x 5 min total; pt had increased difficulty properly shifting weight fwd of LLE; CGA, min verbal/tactile cues for proper weight shift  PT Education - 11/12/15 1544    Education provided Yes   Education Details core exercises in sitting, weight shifting   Person(s) Educated Patient   Methods Explanation   Comprehension Verbalized understanding             PT Long Term Goals - 11/05/15 1700      PT LONG TERM GOAL #1   Title Patient will be able to walk safely, erect posture with appropriate AD on level surfaces >600' and stairs by 12/05/2015    Baseline using rolling walker (front wheeled) on level surfaces short distances with forward flexed posture   Status New     PT LONG TERM GOAL #2   Title Patient will demonstrate  improved function for daily activities with mild pain as indicated by modified oswestry score of 40% or less by 12/17/2015   Baseline MODI 62% (severe self perceived disability)   Status New     PT LONG TERM GOAL #3   Title Patient will demonstrate functional community ambulation and low fall rist by 10MW of 10 seconds or less by 12/17/2015   Baseline to be assessed next session   Status New     PT LONG TERM GOAL #4   Title Patient will be independent with home program for exercises and self management by 12/17/2015 to continue with progressive healing once discharged from physical therapy    Baseline limited/no knowledge of appropriate exercises and progression    Status New               Plan - 11/12/15 1622    Clinical Impression Statement Pt presented to therapy today with c/o increased back pain following HEP since last session. Pt demonstrated fatigue throughout exercises, LLE>RLE and required rest breaks. Pt sit <> stand improved as he was able to perform with better quality this date. Pt R ankle DF and L hip flexion continue to be weak this date as pt had to assist LLE during seated exercises. Pt needs continued skilled PT intervention to maximize overall strength and function.   Rehab Potential Good   PT Frequency 2x / week   PT Duration 6 weeks   PT Treatment/Interventions Patient/family education;Gait training;Cryotherapy;Therapeutic activities;Therapeutic exercise;Neuromuscular re-education;Scar mobilization;Manual techniques;Moist Heat   PT Next Visit Plan progress exercises, core control, gait training   PT Home Exercise Plan core control, LE exercises      Patient will benefit from skilled therapeutic intervention in order to improve the following deficits and impairments:  Decreased strength, Decreased activity tolerance, Decreased knowledge of precautions, Pain, Decreased endurance, Decreased range of motion, Difficulty walking, Increased muscle spasms  Visit  Diagnosis: Muscle weakness (generalized)  Difficulty in walking, not elsewhere classified  Bilateral low back pain, with sciatica presence unspecified     Problem List Patient Active Problem List   Diagnosis Date Noted  . Adjustment disorder with depressed mood   . Osteoarthritis of spine with radiculopathy, lumbar region 10/15/2015  . Abnormal MRI   . Slurred speech   . Surgery, elective   . Loose stools   . OSA (obstructive sleep apnea)   . Acute blood loss anemia   . Pneumonia   . Urinary retention   . Lewy body dementia   . Encephalopathy   . Altered mental status   . Acute encephalopathy 10/12/2015  . Hypokalemia 10/12/2015  . Anemia 10/12/2015  . S/P lumbar fusion 10/12/2015  . Acute respiratory failure (HCC)   . Degenerative disc disease, lumbar 10/01/2015  . Sciatica  of right side associated with disorder of lumbosacral spine 07/21/2015  . Hypertension 02/18/2015  . Diabetes (HCC) 02/18/2015  . Gout 02/18/2015  . Hyperlipidemia 02/18/2015  . Arthritis 02/18/2015  . Encounter to establish care 02/18/2015   Jac CanavanBrooke Xanthe Couillard, SPT  Jac CanavanBrooke Raquon Milledge 11/12/2015, 4:23 PM  [place] [facility] [address] [place], [name], [address] Phone: [phone]   Fax:  [phone]  Name: Rupert StacksGary L Sanville MRN: [id number] Date of Birth: 22-Feb-1946

## 2015-11-14 DIAGNOSIS — H538 Other visual disturbances: Secondary | ICD-10-CM | POA: Diagnosis not present

## 2015-11-14 DIAGNOSIS — E119 Type 2 diabetes mellitus without complications: Secondary | ICD-10-CM | POA: Diagnosis not present

## 2015-11-14 LAB — HM DIABETES EYE EXAM

## 2015-11-19 ENCOUNTER — Encounter: Payer: Self-pay | Admitting: Physical Therapy

## 2015-11-19 ENCOUNTER — Ambulatory Visit: Payer: PPO | Admitting: Physical Therapy

## 2015-11-19 ENCOUNTER — Encounter: Payer: Self-pay | Admitting: Family Medicine

## 2015-11-19 DIAGNOSIS — R262 Difficulty in walking, not elsewhere classified: Secondary | ICD-10-CM

## 2015-11-19 DIAGNOSIS — M545 Low back pain: Secondary | ICD-10-CM

## 2015-11-19 DIAGNOSIS — M6281 Muscle weakness (generalized): Secondary | ICD-10-CM

## 2015-11-19 NOTE — Therapy (Signed)
[place] [facility] [address], [place], [address] Phone: [phone]   Fax:  [phone]  Physical Therapy Treatment  Patient Details  Name: Danny Patterson MRN: [id number] Date of Birth: December 25, 1945 Referring Provider: Karn Cassis MD  Encounter Date: 11/19/2015      PT End of Session - 11/19/15 1136    Visit Number 4   Number of Visits 12   Date for PT Re-Evaluation 12/17/15   Authorization Type 4   Authorization Time Period 10 (G code)   PT Start Time 1116   PT Stop Time 1157   PT Time Calculation (min) 41 min   Equipment Utilized During Treatment Back brace   Activity Tolerance Patient tolerated treatment well;Other (comment)  limited by weakness, surgical limitations   Behavior During Therapy [facility] for tasks assessed/performed      Past Medical History:  Diagnosis Date  . Allergy   . Arthritis   . Diabetes mellitus without complication (HCC)   . Gout   . Hyperlipidemia   . Hypertension   . Kidney stone   . Rheumatic fever     Past Surgical History:  Procedure Laterality Date  . BACK SURGERY    . knot     removed from neck  . POSTERIOR LUMBAR FUSION 4 LEVEL N/A 10/01/2015   Procedure: Lumbar three-four,  Lumbar four-five,  Lumbar five-Sacrum one posterior lumbar interbody fusion with Laminotomy at Lumbar Two-Three;  Surgeon: Hilda Lias, MD;  Location: [facility];  Service: Neurosurgery;  Laterality: N/A;    There were no vitals filed for this visit.      Subjective Assessment - 11/19/15 1134    Subjective Patient reports he still feels his left leg is very weak and he feels tingling in left leg and burning if he touches it too much.    Limitations Lifting;Standing;Walking;House hold activities;Other (comment)   Patient Stated Goals get back to full function for work and be able to walk without AD   Currently in Pain? No/denies      Objective: Gait: ambulating into clinic using  rolling walker, back brace in place, improved cadence and more erect posture from previous week  Treatment: Therapeutic exercise: patient performed exercises with guidance, verbal and tactile cues and demonstration of PT: Back brace worn throughout session  Sitting: on elevated treatment table Roll ball under foot with 2# ankle weights in place: 25 reps each LE LAQ with 2# ankle weights x 15 reps each LE Each foot on balance stone: Hip flexion (no weights on ankles) right x 15, left with assistance x 10 and then with patient just un weighting leg to activate hip flexors (repeated demonstration and cuing required to perform properly) hip abduction in sitting (clam)with guidance of therapist with isometric hold at end range 5 seconds x 10 reps Modified leg press with patient cued to press heel of each foot into balance stones x 10 reps each LE  Sit <> stand from elevated mat (~23") with airex pad under right LE to encourage weight shift/use of left LE 3 x 5 reps Standing weight shifting with right LE on airex pad and then left foot on airex pad (staggered stance) shift back and forth x 10 (used walker/chair for support) Step ups onto 4" step leading with right LE x 10(used walker/chair for support) Step ups onto airex pad leading with right LE x 10 (patient unable to step up onto regular 4" step leading with right  LE) (used walker/chair for support)   Patient response to treatment: patient reported feeling stronger in left LE with walking at end of session. He required assistance to perform most exercises involving left LE due to LE weakness. Mild fatigue noted and required minimal short rest periods throughout session today      PT Education - 11/19/15 1135    Education provided Yes   Education Details HEP re assessed to not work so hard to perform marching in place with left leg   Person(s) Educated Patient   Methods Explanation;Demonstration;Verbal cues   Comprehension Verbalized  understanding;Returned demonstration;Verbal cues required             PT Long Term Goals - 11/05/15 1700      PT LONG TERM GOAL #1   Title Patient will be able to walk safely, erect posture with appropriate AD on level surfaces >600' and stairs by 12/05/2015    Baseline using rolling walker (front wheeled) on level surfaces short distances with forward flexed posture   Status New     PT LONG TERM GOAL #2   Title Patient will demonstrate improved function for daily activities with mild pain as indicated by modified oswestry score of 40% or less by 12/17/2015   Baseline MODI 62% (severe self perceived disability)   Status New     PT LONG TERM GOAL #3   Title Patient will demonstrate functional community ambulation and low fall rist by of 10 seconds or less by 12/17/2015   Baseline to be assessed next session   Status New     PT LONG TERM GOAL #4   Title Patient will be independent with home program for exercises and self management by 12/17/2015 to continue with progressive healing once discharged from physical therapy    Baseline limited/no knowledge of appropriate exercises and progression    Status New               Plan - 11/20/15 1610    Clinical Impression Statement Patient demonstrates improvement with ability to walk with increased cadence and more erect posture demonstrating improvement with strength. He continues with significant weakness in left LE and core following back surgery and will benefit from continued physical therapy intervention to address these in order to improve function.    Rehab Potential Good   PT Frequency 2x / week   PT Duration 6 weeks   PT Treatment/Interventions Patient/family education;Gait training;Cryotherapy;Therapeutic activities;Therapeutic exercise;Neuromuscular re-education;Scar mobilization;Manual techniques;Moist Heat   PT Next Visit Plan progress exercises, core control, gait training, weight shifting   PT Home Exercise Plan  core control, LE exercises      Patient will benefit from skilled therapeutic intervention in order to improve the following deficits and impairments:  Decreased strength, Decreased activity tolerance, Decreased knowledge of precautions, Pain, Decreased endurance, Decreased range of motion, Difficulty walking, Increased muscle spasms  Visit Diagnosis: Muscle weakness (generalized)  Difficulty in walking, not elsewhere classified  Bilateral low back pain, with sciatica presence unspecified     Problem List Patient Active Problem List   Diagnosis Date Noted  . Adjustment disorder with depressed mood   . Osteoarthritis of spine with radiculopathy, lumbar region 10/15/2015  . Abnormal MRI   . Slurred speech   . Surgery, elective   . Loose stools   . OSA (obstructive sleep apnea)   . Acute blood loss anemia   . Pneumonia   . Urinary retention   . Lewy body dementia   . Encephalopathy   .  Altered mental status   . Acute encephalopathy 10/12/2015  . Hypokalemia 10/12/2015  . Anemia 10/12/2015  . S/P lumbar fusion 10/12/2015  . Acute respiratory failure (HCC)   . Degenerative disc disease, lumbar 10/01/2015  . Sciatica of right side associated with disorder of lumbosacral spine 07/21/2015  . Hypertension 02/18/2015  . Diabetes (HCC) 02/18/2015  . Gout 02/18/2015  . Hyperlipidemia 02/18/2015  . Arthritis 02/18/2015  . Encounter to establish care 02/18/2015    Carl BestBrooks, Marie L 11/20/2015, 8:54 AM  [facility] [address] [place], [name], [address] Phone: [phone]   Fax:  [phone]  Name: Rupert StacksGary L Roulhac MRN: [id number] Date of Birth: 1946/02/01

## 2015-11-20 ENCOUNTER — Encounter: Payer: Self-pay | Admitting: Physical Therapy

## 2015-11-20 ENCOUNTER — Ambulatory Visit: Payer: PPO | Admitting: Physical Therapy

## 2015-11-20 DIAGNOSIS — M6281 Muscle weakness (generalized): Secondary | ICD-10-CM

## 2015-11-20 DIAGNOSIS — M545 Low back pain: Secondary | ICD-10-CM

## 2015-11-20 DIAGNOSIS — R262 Difficulty in walking, not elsewhere classified: Secondary | ICD-10-CM

## 2015-11-20 NOTE — Therapy (Signed)
[facility] PHYSICAL AND SPORTS MEDICINE [address], [place], [address] Phone: [phone]   Fax:  [phone]  Physical Therapy Treatment  Patient Details  Name: Danny Patterson MRN: [id number] Date of Birth: Apr 23, 1945 Referring Provider: Karn Cassis MD  Encounter Date: 11/20/2015      PT End of Session - 11/20/15 1521    Visit Number 5   Number of Visits 12   Date for PT Re-Evaluation 12/17/15   Authorization Type 5   Authorization Time Period 10 (G code)   PT Start Time 1352   PT Stop Time 1430   PT Time Calculation (min) 38 min   Equipment Utilized During Treatment Back brace   Activity Tolerance Patient tolerated treatment well;Other (comment)  limited by weakness, surgical limitations   Behavior During Therapy [facility] for tasks assessed/performed      Past Medical History:  Diagnosis Date  . Allergy   . Arthritis   . Diabetes mellitus without complication (HCC)   . Gout   . Hyperlipidemia   . Hypertension   . Kidney stone   . Rheumatic fever     Past Surgical History:  Procedure Laterality Date  . BACK SURGERY    . knot     removed from neck  . POSTERIOR LUMBAR FUSION 4 LEVEL N/A 10/01/2015   Procedure: Lumbar three-four,  Lumbar four-five,  Lumbar five-Sacrum one posterior lumbar interbody fusion with Laminotomy at Lumbar Two-Three;  Surgeon: Hilda Lias, MD;  Location: [facility];  Service: Neurosurgery;  Laterality: N/A;    There were no vitals filed for this visit.      Subjective Assessment - 11/20/15 1517    Subjective Pt states he was not sore following last treatment session. He says his back pain is slightly increased today but does not attribute it to the therapy session yesterday.   Limitations Lifting;Standing;Walking;House hold activities;Other (comment)   Patient Stated Goals get back to full function for work and be able to walk without AD   Currently in Pain? Yes   Pain Score --   rated as "moderate"   Pain Location Back   Pain Orientation Left;Right   Pain Descriptors / Indicators Sore     Therex: Bil LAQ with 3# weight, 2x10 with 5 sec hold each from chair; pt had increased difficulty with LLE  Sit <> stands with airex from mat, 2x15 with no UE support; pt demonstrated slight fatigue towards end of session  Bil step ups on 4" step with 1 UE support on FWW with RLE and BUE support with LLE, 1x15 each; pt had increased trunk flexion with LLE step up and he demonstrated fatigue with LLE at end of exercise  Standing abd on airex pad with 3# ankle weights and BUE support on FWW, 2x15 each; mod verbal cues to decrease toe out during exercise bilaterally; increased forward and lateral trunk flexion with LLE > RLE  BLE marching while seated on mat (slightly elevated seat height), 1x10 each; pt had improved ability to elevate LLE   bil SLS with bil HHA from SPT, 5x10 sec holds each; pt demo good control and upright posture with RLE, but had increased fwd/lat trunk flexion when on LLE; pt demo increased fatigue following        PT Education - 11/20/15 1519    Education provided Yes   Education Details proper exercise technique, maintain upright trunk throughout all standing exercises   Person(s) Educated Patient  Methods Explanation   Comprehension Verbalized understanding             PT Long Term Goals - 11/05/15 1700      PT LONG TERM GOAL #1   Title Patient will be able to walk safely, erect posture with appropriate AD on level surfaces >600' and stairs by 12/05/2015    Baseline using rolling walker (front wheeled) on level surfaces short distances with forward flexed posture   Status New     PT LONG TERM GOAL #2   Title Patient will demonstrate improved function for daily activities with mild pain as indicated by modified oswestry score of 40% or less by 12/17/2015   Baseline MODI 62% (severe self perceived disability)   Status New     PT LONG TERM  GOAL #3   Title Patient will demonstrate functional community ambulation and low fall rist by 10MW of 10 seconds or less by 12/17/2015   Baseline to be assessed next session   Status New     PT LONG TERM GOAL #4   Title Patient will be independent with home program for exercises and self management by 12/17/2015 to continue with progressive healing once discharged from physical therapy    Baseline limited/no knowledge of appropriate exercises and progression    Status New               Plan - 11/20/15 1522    Clinical Impression Statement Pt tolerated progressed strengthening this date but he continues to demonstrate deficits in endurance. Pt demonstrated increased trunk flexion and lateral flexion when performing standing LE strengthening of the LLE. Pt required verbal and tactile cues for proper technique but he had difficulty correcting due to LLE weakness. Pt needs continued skilled PT intervention to maximize overall function.   Rehab Potential Good   PT Frequency 2x / week   PT Duration 6 weeks   PT Treatment/Interventions Patient/family education;Gait training;Cryotherapy;Therapeutic activities;Therapeutic exercise;Neuromuscular re-education;Scar mobilization;Manual techniques;Moist Heat   PT Next Visit Plan progress exercises, core control, gait training, weight shifting   PT Home Exercise Plan core control, LE exercises      Patient will benefit from skilled therapeutic intervention in order to improve the following deficits and impairments:  Decreased strength, Decreased activity tolerance, Decreased knowledge of precautions, Pain, Decreased endurance, Decreased range of motion, Difficulty walking, Increased muscle spasms  Visit Diagnosis: Muscle weakness (generalized)  Difficulty in walking, not elsewhere classified  Bilateral low back pain, with sciatica presence unspecified     Problem List Patient Active Problem List   Diagnosis Date Noted  . Adjustment  disorder with depressed mood   . Osteoarthritis of spine with radiculopathy, lumbar region 10/15/2015  . Abnormal MRI   . Slurred speech   . Surgery, elective   . Loose stools   . OSA (obstructive sleep apnea)   . Acute blood loss anemia   . Pneumonia   . Urinary retention   . Lewy body dementia   . Encephalopathy   . Altered mental status   . Acute encephalopathy 10/12/2015  . Hypokalemia 10/12/2015  . Anemia 10/12/2015  . S/P lumbar fusion 10/12/2015  . Acute respiratory failure (HCC)   . Degenerative disc disease, lumbar 10/01/2015  . Sciatica of right side associated with disorder of lumbosacral spine 07/21/2015  . Hypertension 02/18/2015  . Diabetes (HCC) 02/18/2015  . Gout 02/18/2015  . Hyperlipidemia 02/18/2015  . Arthritis 02/18/2015  . Encounter to establish care 02/18/2015   Jac CanavanBrooke Powell, SPT  Jac Canavan 11/20/2015, 3:27 PM  [facility] PHYSICAL AND SPORTS MEDICINE [address], [place], [address] Phone: [phone]   Fax:  [phone]  Name: Danny Patterson MRN: [id number] Date of Birth: 1945/09/26

## 2015-11-26 ENCOUNTER — Ambulatory Visit: Payer: PPO | Attending: Neurosurgery | Admitting: Physical Therapy

## 2015-11-26 ENCOUNTER — Encounter: Payer: Self-pay | Admitting: Physical Therapy

## 2015-11-26 DIAGNOSIS — M545 Low back pain: Secondary | ICD-10-CM | POA: Insufficient documentation

## 2015-11-26 DIAGNOSIS — M6281 Muscle weakness (generalized): Secondary | ICD-10-CM | POA: Insufficient documentation

## 2015-11-26 DIAGNOSIS — R262 Difficulty in walking, not elsewhere classified: Secondary | ICD-10-CM | POA: Diagnosis not present

## 2015-11-27 ENCOUNTER — Encounter: Payer: Self-pay | Admitting: Family Medicine

## 2015-11-27 ENCOUNTER — Ambulatory Visit: Payer: PPO | Admitting: Family Medicine

## 2015-11-27 ENCOUNTER — Ambulatory Visit (INDEPENDENT_AMBULATORY_CARE_PROVIDER_SITE_OTHER): Payer: PPO | Admitting: Family Medicine

## 2015-11-27 VITALS — BP 142/86 | HR 78 | Temp 100.0°F | Resp 16 | Ht 71.0 in | Wt 227.0 lb

## 2015-11-27 DIAGNOSIS — L0390 Cellulitis, unspecified: Secondary | ICD-10-CM | POA: Insufficient documentation

## 2015-11-27 DIAGNOSIS — L03115 Cellulitis of right lower limb: Secondary | ICD-10-CM

## 2015-11-27 DIAGNOSIS — I10 Essential (primary) hypertension: Secondary | ICD-10-CM

## 2015-11-27 DIAGNOSIS — R600 Localized edema: Secondary | ICD-10-CM | POA: Diagnosis not present

## 2015-11-27 DIAGNOSIS — E785 Hyperlipidemia, unspecified: Secondary | ICD-10-CM

## 2015-11-27 MED ORDER — FUROSEMIDE 20 MG PO TABS: ORAL_TABLET | ORAL | 6 refills | Status: DC

## 2015-11-27 MED ORDER — DOXYCYCLINE HYCLATE 100 MG PO TABS: 100.0000 mg | ORAL_TABLET | Freq: Two times a day (BID) | ORAL | 0 refills | Status: DC

## 2015-11-27 MED ORDER — ATORVASTATIN CALCIUM 20 MG PO TABS: 20.0000 mg | ORAL_TABLET | Freq: Every day | ORAL | 12 refills | Status: DC

## 2015-11-27 MED ORDER — AMLODIPINE BESYLATE 2.5 MG PO TABS: ORAL_TABLET | ORAL | 12 refills | Status: DC

## 2015-11-27 NOTE — Progress Notes (Signed)
Name: Danny Patterson   MRN: [id number]    DOB: 1945/10/07   Date:11/27/2015       Progress Note  Subjective  Chief Complaint  Chief Complaint  Patient presents with  . Foot Swelling    HPI Here for f/u of feet/leg swelling.  Has had some swelling ever sicne back surgery 2 months ago.  Had DVT w/u that was neg after surgery.  R ankle and foot more swollen and some redness to medial foot x past 3 days.  No pain in foot ankle.  Both feet and legs are still noticeably swollen.  No SOB.  No chest pain. No problem-specific Assessment & Plan notes found for this encounter.   Past Medical History:  Diagnosis Date  . Allergy   . Arthritis   . Diabetes mellitus without complication (HCC)   . Gout   . Hyperlipidemia   . Hypertension   . Kidney stone   . Rheumatic fever     Past Surgical History:  Procedure Laterality Date  . BACK SURGERY    . knot     removed from neck  . POSTERIOR LUMBAR FUSION 4 LEVEL N/A 10/01/2015   Procedure: Lumbar three-four,  Lumbar four-five,  Lumbar five-Sacrum one posterior lumbar interbody fusion with Laminotomy at Lumbar Two-Three;  Surgeon: Hilda Lias, MD;  Location: [facility];  Service: Neurosurgery;  Laterality: N/A;    Family History  Problem Relation Age of Onset  . Parkinson's disease Father 34  . Kidney disease Maternal Grandmother     Social History   Social History  . Marital status: Married    Spouse name: N/A  . Number of children: N/A  . Years of education: N/A   Occupational History  . Not on file.   Social History Main Topics  . Smoking status: Never Smoker  . Smokeless tobacco: Never Used  . Alcohol use 1.8 oz/week    3 Cans of beer per week  . Drug use: No  . Sexual activity: Not on file   Other Topics Concern  . Not on file   Social History Narrative  . No narrative on file     Current Outpatient Prescriptions:  .  acetaminophen (TYLENOL) 325 MG tablet, Take 2 tablets (650 mg total) by mouth 4 (four) times  daily -  with meals and at bedtime., Disp: , Rfl:  .  amLODipine (NORVASC) 2.5 MG tablet, Take 1 tablet daily, Disp: 30 tablet, Rfl: 12 .  atorvastatin (LIPITOR) 20 MG tablet, Take 1 tablet (20 mg total) by mouth daily at 6 PM., Disp: 30 tablet, Rfl: 12 .  furosemide (LASIX) 20 MG tablet, Take 2 tablets each AM for swelling, Disp: 60 tablet, Rfl: 6 .  gabapentin (NEURONTIN) 100 MG capsule, Take 100 mg by mouth 3 (three) times daily., Disp: , Rfl:  .  magnesium gluconate (MAGONATE) 500 MG tablet, Take 500 mg by mouth daily., Disp: , Rfl:  .  metFORMIN (GLUCOPHAGE) 500 MG tablet, Take 1 tablet each AM with breakfast. (Patient taking differently: Take 500 mg by mouth daily with breakfast. Take 1 tablet each AM with breakfast.), Disp: 90 tablet, Rfl: 3 .  methocarbamol (ROBAXIN) 500 MG tablet, Take 1 tablet (500 mg total) by mouth 3 (three) times daily. For muscle aches/spasms, Disp: 90 tablet, Rfl: 0 .  potassium chloride SA (K-DUR,KLOR-CON) 20 MEQ tablet, Take 1 tablet (20 mEq total) by mouth daily., Disp: 30 tablet, Rfl: 0 .  traMADol (ULTRAM) 50 MG tablet, Take  50 mg by mouth every 6 (six) hours as needed., Disp: , Rfl:  .  doxycycline (VIBRA-TABS) 100 MG tablet, Take 1 tablet (100 mg total) by mouth 2 (two) times daily. Take with food, Disp: 20 tablet, Rfl: 0  Allergies  Allergen Reactions  . Ace Inhibitors Other (See Comments)    Angioedema.     Review of Systems  Constitutional: Negative.   HENT: Negative.   Eyes: Negative.   Respiratory: Negative for cough, shortness of breath and wheezing.   Cardiovascular: Positive for leg swelling. Negative for chest pain and palpitations.  Gastrointestinal: Negative.   Genitourinary: Negative.   Musculoskeletal: Negative.   Skin: Negative.       Objective  Vitals:   11/27/15 1548  BP: (!) 142/86  Pulse: 78  Resp: 16  Temp: 100 F (37.8 C)  TempSrc: Oral  Weight: 227 lb (103 kg)  Height: 5\' 11"  (1.803 m)    Physical Exam   Constitutional: He is oriented to person, place, and time and well-developed, well-nourished, and in no distress.  HENT:  Head: Normocephalic and atraumatic.  Cardiovascular: Normal rate, regular rhythm and normal heart sounds.  Exam reveals no gallop and no friction rub.   No murmur heard. Pulmonary/Chest: Effort normal and breath sounds normal. No respiratory distress. He has no wheezes. He has no rales.  Musculoskeletal: He exhibits no edema.  1+ pitting edema of R foot and ankle.  Neurological: He is alert and oriented to person, place, and time.  Skin:  R foot is swollen and somewhat red over dorsal foot and medial ankle area.      Vitals reviewed.         Assessment & Plan  Problem List Items Addressed This Visit      Cardiovascular and Mediastinum   Hypertension   Relevant Medications   amLODipine (NORVASC) 2.5 MG tablet   atorvastatin (LIPITOR) 20 MG tablet   furosemide (LASIX) 20 MG tablet     Other   Hyperlipidemia   Relevant Medications   amLODipine (NORVASC) 2.5 MG tablet   atorvastatin (LIPITOR) 20 MG tablet   furosemide (LASIX) 20 MG tablet   Cellulitis - Primary   Relevant Medications   doxycycline (VIBRA-TABS) 100 MG tablet   Pedal edema   Relevant Medications   furosemide (LASIX) 20 MG tablet    Other Visit Diagnoses   None.     Meds ordered this encounter  Medications  . traMADol (ULTRAM) 50 MG tablet    Sig: Take 50 mg by mouth every 6 (six) hours as needed.  . gabapentin (NEURONTIN) 100 MG capsule    Sig: Take 100 mg by mouth 3 (three) times daily.  Marland Kitchen. amLODipine (NORVASC) 2.5 MG tablet    Sig: Take 1 tablet daily    Dispense:  30 tablet    Refill:  12  . atorvastatin (LIPITOR) 20 MG tablet    Sig: Take 1 tablet (20 mg total) by mouth daily at 6 PM.    Dispense:  30 tablet    Refill:  12  . furosemide (LASIX) 20 MG tablet    Sig: Take 2 tablets each AM for swelling    Dispense:  60 tablet    Refill:  6  . doxycycline (VIBRA-TABS)  100 MG tablet    Sig: Take 1 tablet (100 mg total) by mouth 2 (two) times daily. Take with food    Dispense:  20 tablet    Refill:  0   1. Cellulitis of  right lower extremity  - doxycycline (VIBRA-TABS) 100 MG tablet; Take 1 tablet (100 mg total) by mouth 2 (two) times daily. Take with food  Dispense: 20 tablet; Refill: 0  2. Pedal edema  - furosemide (LASIX) 20 MG tablet; Take 2 tablets each AM for swelling  Dispense: 60 tablet; Refill: 6  3. Essential hypertension Cont other meds - amLODipine (NORVASC) 2.5 MG tablet; Take 1 tablet daily  Dispense: 30 tablet; Refill: 12  4. Hyperlipidemia  - atorvastatin (LIPITOR) 20 MG tablet; Take 1 tablet (20 mg total) by mouth daily at 6 PM.  Dispense: 30 tablet; Refill: 12

## 2015-11-27 NOTE — Therapy (Signed)
[facility]IC[facility] [address] [place], [address] Phone: [phone]   Fax:  [phone]  Physical Therapy Treatment  Patient Details  Name: Danny Patterson MRN: [id number] Date of Birth: 1946/01/27 Referring Provider: Karn Cassis MD  Encounter Date: 11/26/2015      PT End of Session - 11/26/15 1540    Visit Number 6   Number of Visits 12   Date for PT Re-Evaluation 12/17/15   Authorization Type 6   Authorization Time Period 10 (G code)   PT Start Time 1531   PT Stop Time 1616   PT Time Calculation (min) 45 min   Equipment Utilized During Treatment Back brace   Activity Tolerance Patient tolerated treatment well;Other (comment)   Behavior During Therapy WFL for tasks assessed/performed      Past Medical History:  Diagnosis Date  . Allergy   . Arthritis   . Diabetes mellitus without complication (HCC)   . Gout   . Hyperlipidemia   . Hypertension   . Kidney stone   . Rheumatic fever     Past Surgical History:  Procedure Laterality Date  . BACK SURGERY    . knot     removed from neck  . POSTERIOR LUMBAR FUSION 4 LEVEL N/A 10/01/2015   Procedure: Lumbar three-four,  Lumbar four-five,  Lumbar five-Sacrum one posterior lumbar interbody fusion with Laminotomy at Lumbar Two-Three;  Surgeon: Hilda Lias, MD;  Location: [facility];  Service: Neurosurgery;  Laterality: N/A;    There were no vitals filed for this visit.      Subjective Assessment - 11/26/15 1534    Subjective Patient reports he is now having increased numbness below knees to ankles, worse in the last couple of days. He is getting up and walking around the house more now. He has gone to the mailbox a coulple of times and feels it is difficult. He reports he has been performing SLR and stretching left leg in chair using belt and this may be contributing to his additional numbness.     Limitations Lifting;Standing;Walking;House hold  activities;Other (comment)   Patient Stated Goals get back to full function for work and be able to walk without AD   Currently in Pain? Yes   Pain Score 5    Pain Location Back  feet and ankles   Pain Orientation Right;Left   Pain Descriptors / Indicators Numbness;Aching   Pain Type Surgical pain;Acute pain  Sx 10/01/2015   Pain Onset More than a month ago   Pain Frequency Constant      Objective: Gait: ambulating into clinic using rolling walker, back brace in place, improved cadence and more erect posture from previous week  Treatment: Therapeutic exercise: patient performed exercises with guidance, verbal and tactile cues and demonstration of PT: Back brace worn throughout session  Sitting: on elevated treatment table Hip adduction with ball between knees x 15 reps Roll ball under foot with 2# ankle weights in place: 25 reps each LE LAQ with 2# ankle weights x 15 reps each LE Knee flexion with resistive band (red) 2 x 15 reps Hip flexion (no weights on ankles) right x 15, left with assistance 2 x 10  hip abduction in sitting (clam)with guidance of therapist with isometric hold at end range 5 seconds x 10 reps Standing hip flexion through partial ROM with good alignment and proper weight shifting with repeated verbal cuing 15 reps  Sit <> stand from elevated  mat (~23") with airex pad under right LE to encourage weight shift/use of left LE 3 x 5 reps Step ups onto airex pad  leading with right LE x 10(used walker/chair for support) Step ups onto airex pad leading with right LE x 10 (used walker/chair for support)   Patient response to treatment: Patient reported feeling less numbness in lower legs with exercises and modifications with sitting, more erect posture with less pressure on posterior thighs. He is more aware of proper exercise progression to not aggravate back or leg symptoms. He fatigues mildly with exercises and was able to ambulate with improved posture and  strength in LE's at end of session.        PT Education - 11/26/15 1540    Education provided Yes   Education Details HEP: moving around the home, not sitting extended periods of time   Person(s) Educated Patient   Methods Explanation   Comprehension Verbalized understanding             PT Long Term Goals - 11/05/15 1700      PT LONG TERM GOAL #1   Title Patient will be able to walk safely, erect posture with appropriate AD on level surfaces >600' and stairs by 12/05/2015    Baseline using rolling walker (front wheeled) on level surfaces short distances with forward flexed posture   Status New     PT LONG TERM GOAL #2   Title Patient will demonstrate improved function for daily activities with mild pain as indicated by modified oswestry score of 40% or less by 12/17/2015   Baseline MODI 62% (severe self perceived disability)   Status New     PT LONG TERM GOAL #3   Title Patient will demonstrate functional community ambulation and low fall rist by 10MW of 10 seconds or less by 12/17/2015   Baseline to be assessed next session   Status New     PT LONG TERM GOAL #4   Title Patient will be independent with home program for exercises and self management by 12/17/2015 to continue with progressive healing once discharged from physical therapy    Baseline limited/no knowledge of appropriate exercises and progression    Status New               Plan - 11/26/15 1542    Clinical Impression Statement Patient is progressing slowly with exercises due to significant weakness. He is able to tolerate increased intensity of exercises with guidance and cuing. He should continue to progress towards goals with additional physical therapy intervention for strengthening.    Rehab Potential Good   PT Frequency 2x / week   PT Treatment/Interventions Patient/family education;Gait training;Cryotherapy;Therapeutic activities;Therapeutic exercise;Neuromuscular re-education;Scar  mobilization;Manual techniques;Moist Heat   PT Next Visit Plan progress exercises, core control, gait training, weight shifting   PT Home Exercise Plan core control, LE exercises      Patient will benefit from skilled therapeutic intervention in order to improve the following deficits and impairments:  Decreased strength, Decreased activity tolerance, Decreased knowledge of precautions, Pain, Decreased endurance, Decreased range of motion, Difficulty walking, Increased muscle spasms  Visit Diagnosis: Muscle weakness (generalized)  Difficulty in walking, not elsewhere classified  Bilateral low back pain, with sciatica presence unspecified     Problem List Patient Active Problem List   Diagnosis Date Noted  . Cellulitis 11/27/2015  . Pedal edema 11/27/2015  . Adjustment disorder with depressed mood   . Osteoarthritis of spine with radiculopathy, lumbar region 10/15/2015  .  Abnormal MRI   . Slurred speech   . Surgery, elective   . Loose stools   . OSA (obstructive sleep apnea)   . Acute blood loss anemia   . Pneumonia   . Urinary retention   . Lewy body dementia   . Encephalopathy   . Altered mental status   . Acute encephalopathy 10/12/2015  . Hypokalemia 10/12/2015  . Anemia 10/12/2015  . S/P lumbar fusion 10/12/2015  . Acute respiratory failure (HCC)   . Degenerative disc disease, lumbar 10/01/2015  . Sciatica of right side associated with disorder of lumbosacral spine 07/21/2015  . Hypertension 02/18/2015  . Diabetes (HCC) 02/18/2015  . Gout 02/18/2015  . Hyperlipidemia 02/18/2015  . Arthritis 02/18/2015  . Encounter to establish care 02/18/2015    Beacher May PT 11/27/2015, 5:44 PM  [name] PHYSICAL AND SPORTS MEDICINE [address] [place], [address] Phone: [phone]   Fax:  [phone]  Name: Danny Patterson MRN: [id number] Date of Birth: 1945/07/07

## 2015-11-28 ENCOUNTER — Encounter: Payer: Self-pay | Admitting: Physical Therapy

## 2015-11-28 ENCOUNTER — Ambulatory Visit: Payer: PPO | Admitting: Physical Therapy

## 2015-11-28 ENCOUNTER — Encounter: Payer: PPO | Admitting: Physical Therapy

## 2015-11-28 DIAGNOSIS — M6281 Muscle weakness (generalized): Secondary | ICD-10-CM | POA: Diagnosis not present

## 2015-11-28 DIAGNOSIS — M545 Low back pain: Secondary | ICD-10-CM

## 2015-11-28 DIAGNOSIS — R262 Difficulty in walking, not elsewhere classified: Secondary | ICD-10-CM

## 2015-11-28 NOTE — Therapy (Signed)
[facility] [address] [place], [address] Phone: [phone]   Fax:  [phone]  Physical Therapy Treatment  Patient Details  Name: Danny Patterson MRN: [id number] Date of Birth: 1945/07/17 Referring Provider: Karn Cassis MD  Encounter Date: 11/28/2015      PT End of Session - 11/28/15 1218    Visit Number 7   Number of Visits 12   Date for PT Re-Evaluation 12/17/15   Authorization Type 7   Authorization Time Period 10 (G code)   PT Start Time 1131   PT Stop Time 1210   PT Time Calculation (min) 39 min   Equipment Utilized During Treatment Back brace   Activity Tolerance Patient tolerated treatment well;Patient limited by fatigue   Behavior During Therapy [facility] for tasks assessed/performed      Past Medical History:  Diagnosis Date  . Allergy   . Arthritis   . Diabetes mellitus without complication (HCC)   . Gout   . Hyperlipidemia   . Hypertension   . Kidney stone   . Rheumatic fever     Past Surgical History:  Procedure Laterality Date  . BACK SURGERY    . knot     removed from neck  . POSTERIOR LUMBAR FUSION 4 LEVEL N/A 10/01/2015   Procedure: Lumbar three-four,  Lumbar four-five,  Lumbar five-Sacrum one posterior lumbar interbody fusion with Laminotomy at Lumbar Two-Three;  Surgeon: Hilda Lias, MD;  Location: [facility];  Service: Neurosurgery;  Laterality: N/A;    There were no vitals filed for this visit.      Subjective Assessment - 11/28/15 1141    Subjective Patient reports being seen by MD and diagnosed with cellulitis right foot/LE and is now on antibiotic and Lasix for swelling. He is walking more at home and is able to take some steps inside the home without AD. uses walker if feeling weak in back or not confident with walking.    Limitations Lifting;Standing;Walking;House hold activities;Other (comment)   Patient Stated Goals get back to full function for work and  be able to walk without AD   Currently in Pain? Yes   Pain Score 4    Pain Location Back   Pain Orientation Right;Left   Pain Descriptors / Indicators Aching   Pain Type Surgical pain  10/01/2015   Pain Onset More than a month ago   Pain Frequency Constant      Objective: Gait: ambulating into clinic using rolling walker, back brace in place, improved cadence and more erect posture from previous week Observation; back/ well healed incision, right foot/ankle; mild pink color with mild pitting edema SPO2 end of session: 97%, HR 90  Treatment: Therapeutic exercise: patient performed exercises with guidance, verbal and tactile cues and demonstration of PT: Back brace worn throughout session  Sitting: on elevated treatment table Hip adduction with ball between knees x 15 reps Roll ball under foot with 3# ankle weights in place: 25 reps each LE LAQ with 3# ankle weights  2 x 15 reps each LE Knee flexion with resistive band (red) 2 x 15 reps Hip flexion in sitting: left with assistance 1 x 15 hip abduction in sitting (clam)with guidance of therapist with isometric hold at end range 5 seconds x 10 reps; red resistive band x 15 reps Scapular retraction with red resistive band x 15 reps Standing hip flexion through partial ROM with good alignment and proper weight shifting with repeated verbal  cuing 15 reps  Sit <>stand from elevated mat (~23") with airex pad under right LE to encourage weight shift/use of left LE 3 x 5 reps Step ups onto airex pad  leading with right LE x 10(used walker/chair for support) Step ups onto airex pad leading with right LE x 10 (used walker/chair for support) Side step on airex balance beam 5 reps with UE support at treadmill bar   Patient response to treatment: Patient demonstrated fatigue with knee extension, hip flexion and side stepping exercises left LE due to weakness. Patient able to perform all exercises with minimal cuing and assistance of  therapist for correct alignment and technique No back pain reported throughout session        PT Education - 11/28/15 1217    Education provided Yes   Education Details HEP; discussed energy conservation with left leg weakness, rest between exercises or activities to recuperate   Person(s) Educated Patient   Methods Explanation   Comprehension Verbalized understanding             PT Long Term Goals - 11/05/15 1700      PT LONG TERM GOAL #1   Title Patient will be able to walk safely, erect posture with appropriate AD on level surfaces >600' and stairs by 12/05/2015    Baseline using rolling walker (front wheeled) on level surfaces short distances with forward flexed posture   Status New     PT LONG TERM GOAL #2   Title Patient will demonstrate improved function for daily activities with mild pain as indicated by modified oswestry score of 40% or less by 12/17/2015   Baseline MODI 62% (severe self perceived disability)   Status New     PT LONG TERM GOAL #3   Title Patient will demonstrate functional community ambulation and low fall rist by of 10 seconds or less by 12/17/2015   Baseline to be assessed next session   Status New     PT LONG TERM GOAL #4   Title Patient will be independent with home program for exercises and self management by 12/17/2015 to continue with progressive healing once discharged from physical therapy    Baseline limited/no knowledge of appropriate exercises and progression    Status New               Plan - 11/28/15 1219    Clinical Impression Statement Patient demonstrates improvement slowly with left LE strength and decreasing lower back pain s/p surgery. He requires guidance to perform exercises with appropriate technique and to rest when appropriate. He has primary limitations of weakness and decreased endurance and should continue to improve with additional physical therapy intervention.    Rehab Potential Good   PT Frequency 2x /  week   PT Duration 6 weeks   PT Treatment/Interventions Patient/family education;Gait training;Cryotherapy;Therapeutic activities;Therapeutic exercise;Neuromuscular re-education;Scar mobilization;Manual techniques;Moist Heat   PT Next Visit Plan progress exercises, core control, gait training, weight shifting   PT Home Exercise Plan core control, LE exercises      Patient will benefit from skilled therapeutic intervention in order to improve the following deficits and impairments:  Decreased strength, Decreased activity tolerance, Decreased knowledge of precautions, Pain, Decreased endurance, Decreased range of motion, Difficulty walking, Increased muscle spasms  Visit Diagnosis: Muscle weakness (generalized)  Difficulty in walking, not elsewhere classified  Bilateral low back pain, with sciatica presence unspecified     Problem List Patient Active Problem List   Diagnosis Date Noted  . Cellulitis 11/27/2015  .  Pedal edema 11/27/2015  . Adjustment disorder with depressed mood   . Osteoarthritis of spine with radiculopathy, lumbar region 10/15/2015  . Abnormal MRI   . Slurred speech   . Surgery, elective   . Loose stools   . OSA (obstructive sleep apnea)   . Acute blood loss anemia   . Pneumonia   . Urinary retention   . Lewy body dementia   . Encephalopathy   . Altered mental status   . Acute encephalopathy 10/12/2015  . Hypokalemia 10/12/2015  . Anemia 10/12/2015  . S/P lumbar fusion 10/12/2015  . Acute respiratory failure (HCC)   . Degenerative disc disease, lumbar 10/01/2015  . Sciatica of right side associated with disorder of lumbosacral spine 07/21/2015  . Hypertension 02/18/2015  . Diabetes (HCC) 02/18/2015  . Gout 02/18/2015  . Hyperlipidemia 02/18/2015  . Arthritis 02/18/2015  . Encounter to establish care 02/18/2015    Beacher MayBrooks, Sagar Tengan PT 11/28/2015, 12:23 PM  [facility] [address], [name], [address] Phone: [phone]   Fax:  [phone]  Name: Danny Patterson MRN: [id number] Date of Birth: February 28, 1946

## 2015-12-01 ENCOUNTER — Ambulatory Visit: Payer: PPO | Admitting: Physical Therapy

## 2015-12-01 ENCOUNTER — Encounter: Payer: Self-pay | Admitting: Physical Therapy

## 2015-12-01 DIAGNOSIS — R262 Difficulty in walking, not elsewhere classified: Secondary | ICD-10-CM

## 2015-12-01 DIAGNOSIS — M6281 Muscle weakness (generalized): Secondary | ICD-10-CM

## 2015-12-01 NOTE — Therapy (Signed)
[facility] [address], [place], [address] Phone: [phone]   Fax:  [phone]  Physical Therapy Treatment  Patient Details  Name: Danny Patterson MRN: [id number] Date of Birth: 1946/03/20 Referring Provider: Karn Cassis MD  Encounter Date: 12/01/2015      PT End of Session - 12/01/15 1717    Visit Number 8   Number of Visits 12   Date for PT Re-Evaluation 12/17/15   Authorization Type 8   Authorization Time Period 10 (G code)   PT Start Time 1531   PT Stop Time 1613   PT Time Calculation (min) 42 min   Equipment Utilized During Treatment Back brace   Activity Tolerance Patient tolerated treatment well;Patient limited by fatigue   Behavior During Therapy [facility] for tasks assessed/performed      Past Medical History:  Diagnosis Date  . Allergy   . Arthritis   . Diabetes mellitus without complication (HCC)   . Gout   . Hyperlipidemia   . Hypertension   . Kidney stone   . Rheumatic fever     Past Surgical History:  Procedure Laterality Date  . BACK SURGERY    . knot     removed from neck  . POSTERIOR LUMBAR FUSION 4 LEVEL N/A 10/01/2015   Procedure: Lumbar three-four,  Lumbar four-five,  Lumbar five-Sacrum one posterior lumbar interbody fusion with Laminotomy at Lumbar Two-Three;  Surgeon: Hilda Lias, MD;  Location: [facility];  Service: Neurosurgery;  Laterality: N/A;    There were no vitals filed for this visit.      Subjective Assessment - 12/01/15 1536    Subjective Patient reports he is improving with less pain in feet and more feeling. He is noticing improved strength left LE with therapy treatment and exercises. He still fatigues with increased walking, standing activities and uses walker for safety outside the home.   Limitations Lifting;Standing;Walking;House hold activities;Other (comment)   Patient Stated Goals get back to full function for work and be able to walk  without AD   Currently in Pain? No/denies        Objective: Gait: ambulating independently with rolling walker, back brace in place, improved cadence and more erect posture from previous week  Treatment: Therapeutic exercise: patient performed exercises with guidance, verbal and tactile cues and demonstration of PT: Back brace worn throughout session  Sitting: on elevated treatment table Hip adduction with ball between knees x 15 reps Roll ball under foot with 3# ankle weights in place: 25 reps each LE LAQ with 3# ankle weights  2 x 15 reps each LE Knee flexion with resistive band (red) 2 x 15 reps Hip flexion in sitting: left with assistance 1 x 15 (3 x 5 reps with isometric hold end range and eccentric lowering hip abduction in sitting (clam)with guidance of therapist with isometric hold at end range 5 seconds x 10 reps; red resistive band x 15 reps Scapular retraction with red resistive band 2 x 15 reps  Sit <>stand from elevated mat (~24") with airex pad under both LE's to encourage weight shift/use of left LE 3 x 5 reps Side step on airex balance beam 5 reps with UE support at counter Step ups 4" step leading with each LE x 10 reps Step up and over x 10 reps with CG for safety  Patient response to treatment: Patient required minimal verbal cuing for correct technique with exercises. He has difficulty with hip  flexion left LE and requires assistance to perform. Improved posture with decreased hip and knee flexion with walking        PT Long Term Goals - 11/05/15 1700      PT LONG TERM GOAL #1   Title Patient will be able to walk safely, erect posture with appropriate AD on level surfaces >600' and stairs by 12/05/2015    Baseline using rolling walker (front wheeled) on level surfaces short distances with forward flexed posture   Status New     PT LONG TERM GOAL #2   Title Patient will demonstrate improved function for daily activities with mild pain as indicated by  modified oswestry score of 40% or less by 12/17/2015   Baseline MODI 62% (severe self perceived disability)   Status New     PT LONG TERM GOAL #3   Title Patient will demonstrate functional community ambulation and low fall rist by 10MW of 10 seconds or less by 12/17/2015   Baseline to be assessed next session   Status New     PT LONG TERM GOAL #4   Title Patient will be independent with home program for exercises and self management by 12/17/2015 to continue with progressive healing once discharged from physical therapy    Baseline limited/no knowledge of appropriate exercises and progression    Status New               Plan - 12/01/15 1620    Clinical Impression Statement Patient  is progressing with improving endurance and strength as indicated by increased intensity of exercises and ability to perform step ups with less difficulty. He continues with significant weakness in left LE and will require additional physical therapy intervention to achieve goals.   PT Frequency 2x / week   PT Duration 6 weeks   PT Treatment/Interventions Patient/family education;Gait training;Cryotherapy;Therapeutic activities;Therapeutic exercise;Neuromuscular re-education;Scar mobilization;Manual techniques;Moist Heat   PT Next Visit Plan progress exercises, core control, gait training, weight shifting   PT Home Exercise Plan core control, LE exercises, added sit to stand off high seat      Patient will benefit from skilled therapeutic intervention in order to improve the following deficits and impairments:  Decreased strength, Decreased activity tolerance, Decreased knowledge of precautions, Pain, Decreased endurance, Decreased range of motion, Difficulty walking, Increased muscle spasms  Visit Diagnosis: Muscle weakness (generalized)  Difficulty in walking, not elsewhere classified     Problem List Patient Active Problem List   Diagnosis Date Noted  . Cellulitis 11/27/2015  . Pedal edema  11/27/2015  . Adjustment disorder with depressed mood   . Osteoarthritis of spine with radiculopathy, lumbar region 10/15/2015  . Abnormal MRI   . Slurred speech   . Surgery, elective   . Loose stools   . OSA (obstructive sleep apnea)   . Acute blood loss anemia   . Pneumonia   . Urinary retention   . Lewy body dementia   . Encephalopathy   . Altered mental status   . Acute encephalopathy 10/12/2015  . Hypokalemia 10/12/2015  . Anemia 10/12/2015  . S/P lumbar fusion 10/12/2015  . Acute respiratory failure (HCC)   . Degenerative disc disease, lumbar 10/01/2015  . Sciatica of right side associated with disorder of lumbosacral spine 07/21/2015  . Hypertension 02/18/2015  . Diabetes (HCC) 02/18/2015  . Gout 02/18/2015  . Hyperlipidemia 02/18/2015  . Arthritis 02/18/2015  . Encounter to establish care 02/18/2015    Beacher MayBrooks, Marie PT 12/02/2015, 2:48 PM   [facility] [address] [place], [address] Phone: [phone]   Fax:  [phone]  Name: RASHIED CORALLO MRN: [id number] Date of Birth: 1945/05/09

## 2015-12-03 ENCOUNTER — Encounter: Payer: Self-pay | Admitting: Physical Therapy

## 2015-12-03 ENCOUNTER — Ambulatory Visit: Payer: PPO | Admitting: Family Medicine

## 2015-12-03 ENCOUNTER — Ambulatory Visit: Payer: PPO | Admitting: Physical Therapy

## 2015-12-03 DIAGNOSIS — M545 Low back pain: Secondary | ICD-10-CM

## 2015-12-03 DIAGNOSIS — M6281 Muscle weakness (generalized): Secondary | ICD-10-CM | POA: Diagnosis not present

## 2015-12-03 DIAGNOSIS — R262 Difficulty in walking, not elsewhere classified: Secondary | ICD-10-CM

## 2015-12-04 NOTE — Therapy (Signed)
[facility] [address] [place], [name], [address] Phone: [phone]   Fax:  [phone]  Physical Therapy Treatment  Patient Details  Name: Danny StacksGary L Myren MRN: [id number] Date of Birth: 1945-08-09 Referring Provider: Karn CassisBotero, Ernesto M. MD  Encounter Date: 12/03/2015      PT End of Session - 12/03/15 1538    Visit Number 9   Number of Visits 12   Date for PT Re-Evaluation 12/17/15   Authorization Type 9   Authorization Time Period 10 (G code)   PT Start Time 1533   PT Stop Time 1616   PT Time Calculation (min) 43 min   Activity Tolerance Patient tolerated treatment well;Patient limited by fatigue   Behavior During Therapy [facility] for tasks assessed/performed      Past Medical History:  Diagnosis Date  . Allergy   . Arthritis   . Diabetes mellitus without complication (HCC)   . Gout   . Hyperlipidemia   . Hypertension   . Kidney stone   . Rheumatic fever     Past Surgical History:  Procedure Laterality Date  . BACK SURGERY    . knot     removed from neck  . POSTERIOR LUMBAR FUSION 4 LEVEL N/A 10/01/2015   Procedure: Lumbar three-four,  Lumbar four-five,  Lumbar five-Sacrum one posterior lumbar interbody fusion with Laminotomy at Lumbar Two-Three;  Surgeon: Hilda LiasErnesto Botero, MD;  Location: [facility];  Service: Neurosurgery;  Laterality: N/A;    There were no vitals filed for this visit.      Subjective Assessment - 12/03/15 1536    Subjective Patient reports he is having less swelling in his feet and is still on antibiotics for cellulitis. The feeling in his feet is improving, still has numbness in balls of his feet.    Limitations Lifting;Standing;Walking;House hold activities;Other (comment)   Patient Stated Goals get back to full function for work and be able to walk without AD   Currently in Pain? Yes   Pain Score 4    Pain Location Back   Pain Orientation Right   Pain Type Surgical pain   Pain Onset More than a month ago   Pain Frequency Intermittent      Objective: Gait: ambulating independently with rolling walker, back brace in place, improved cadence and more erect posture   Treatment: Therapeutic exercise: patient performed exercises with guidance, verbal and tactile cues and demonstration of PT: Back brace worn throughout session  Sitting: on elevated treatment table LAQ with 3# ankle weights 2 x 15 reps each LE Knee flexion with resistive band (red) 2 x 25 reps Hip flexion in sitting:left with assistance 1x 15 (3 x 5 reps with isometric hold end range and eccentric lowering) hip abduction in sitting (clam)with guidance of therapist with isometric hold at end range 5 seconds x 10 reps Scapular retraction with red resistive band 2 x 15 reps Straight arm pull downs with red resistive band x 15 Bilateral flexion holding ball touch top of head x 10  Sit <>stand from elevated mat (~24")  With ball between knees x 15 reps with correct technique, press weight through heels Side step on airex balance beam x 2 min. with UE support at counter Step ups 4" step leading with each LE x 10 reps Step up and over x 10 reps with CG for safety  Patient response to treatment: Patient demonstrated improved technique with sit to stand with ball between knees with  repetition. Fatigued left LE with step up and over exercises. Required minimal cuing to perform all exercises for correct positioning, technique, back pain no worse following exercises        PT Education - 12/03/15 1617    Education provided Yes   Education Details HEP; re assessed home exercises for proper technique and positioning   Person(s) Educated Patient   Methods Explanation;Demonstration;Verbal cues   Comprehension Verbalized understanding;Returned demonstration;Verbal cues required             PT Long Term Goals - 11/05/15 1700      PT LONG TERM GOAL #1   Title Patient will be able to walk  safely, erect posture with appropriate AD on level surfaces >600' and stairs by 12/05/2015    Baseline using rolling walker (front wheeled) on level surfaces short distances with forward flexed posture   Status New     PT LONG TERM GOAL #2   Title Patient will demonstrate improved function for daily activities with mild pain as indicated by modified oswestry score of 40% or less by 12/17/2015   Baseline MODI 62% (severe self perceived disability)   Status New     PT LONG TERM GOAL #3   Title Patient will demonstrate functional community ambulation and low fall rist by of 10 seconds or less by 12/17/2015   Baseline to be assessed next session   Status New     PT LONG TERM GOAL #4   Title Patient will be independent with home program for exercises and self management by 12/17/2015 to continue with progressive healing once discharged from physical therapy    Baseline limited/no knowledge of appropriate exercises and progression    Status New               Plan - 12/03/15 1630    Clinical Impression Statement Patient demonstrates improving strength, endurance and abiltiy to walk with more erect posture. He continues with weakness and decreased endurance that require additional physical therapy intervention to achieve maximal function for daily tasks and walking.   Rehab Potential Good   PT Frequency 2x / week   PT Duration 6 weeks   PT Treatment/Interventions Patient/family education;Gait training;Cryotherapy;Therapeutic activities;Therapeutic exercise;Neuromuscular re-education;Scar mobilization;Manual techniques;Moist Heat   PT Next Visit Plan progress exercises, core control, gait training, weight shifting   PT Home Exercise Plan core control, LE exercises, added sit to stand off high seat      Patient will benefit from skilled therapeutic intervention in order to improve the following deficits and impairments:  Decreased strength, Decreased activity tolerance, Decreased  knowledge of precautions, Pain, Decreased endurance, Decreased range of motion, Difficulty walking, Increased muscle spasms  Visit Diagnosis: Muscle weakness (generalized)  Difficulty in walking, not elsewhere classified  Bilateral low back pain, with sciatica presence unspecified     Problem List Patient Active Problem List   Diagnosis Date Noted  . Cellulitis 11/27/2015  . Pedal edema 11/27/2015  . Adjustment disorder with depressed mood   . Osteoarthritis of spine with radiculopathy, lumbar region 10/15/2015  . Abnormal MRI   . Slurred speech   . Surgery, elective   . Loose stools   . OSA (obstructive sleep apnea)   . Acute blood loss anemia   . Pneumonia   . Urinary retention   . Lewy body dementia   . Encephalopathy   . Altered mental status   . Acute encephalopathy 10/12/2015  . Hypokalemia 10/12/2015  . Anemia 10/12/2015  . S/P lumbar  fusion 10/12/2015  . Acute respiratory failure (HCC)   . Degenerative disc disease, lumbar 10/01/2015  . Sciatica of right side associated with disorder of lumbosacral spine 07/21/2015  . Hypertension 02/18/2015  . Diabetes (HCC) 02/18/2015  . Gout 02/18/2015  . Hyperlipidemia 02/18/2015  . Arthritis 02/18/2015  . Encounter to establish care 02/18/2015    Beacher May PT 12/04/2015, 1:16 PM  [facility] SPORTS MEDICINE [address], [place], [address] Phone: [phone]   Fax:  [phone]  Name: CAVION FAIOLA MRN: [id number] Date of Birth: 02-02-1946

## 2015-12-09 ENCOUNTER — Encounter: Payer: Self-pay | Admitting: Physical Therapy

## 2015-12-09 ENCOUNTER — Ambulatory Visit: Payer: PPO | Admitting: Physical Therapy

## 2015-12-09 DIAGNOSIS — M6281 Muscle weakness (generalized): Secondary | ICD-10-CM

## 2015-12-09 DIAGNOSIS — R262 Difficulty in walking, not elsewhere classified: Secondary | ICD-10-CM

## 2015-12-09 DIAGNOSIS — M545 Low back pain: Secondary | ICD-10-CM

## 2015-12-09 NOTE — Therapy (Signed)
[facility] [address], [place], [address] Phone: [phone]   Fax:  [phone]  Physical Therapy Treatment Progress report  Patient Details  Name: Danny Patterson MRN: [id number] Date of Birth: June 24, 1945 Referring Provider: Floyce Stakes MD  Encounter Date: 12/09/2015      PT End of Session - 12/09/15 1640    Visit Number 10   Number of Visits 12   Date for PT Re-Evaluation 12/17/15   Authorization Type 10   Authorization Time Period 10 (G code)   PT Start Time 1528   PT Stop Time 1618   PT Time Calculation (min) 50 min   Equipment Utilized During Treatment Back brace   Activity Tolerance Patient tolerated treatment well;Patient limited by fatigue   Behavior During Therapy [facility] for tasks assessed/performed      Past Medical History:  Diagnosis Date  . Allergy   . Arthritis   . Diabetes mellitus without complication ([place])   . Gout   . Hyperlipidemia   . Hypertension   . Kidney stone   . Rheumatic fever     Past Surgical History:  Procedure Laterality Date  . BACK SURGERY    . knot     removed from neck  . POSTERIOR LUMBAR FUSION 4 LEVEL N/A 10/01/2015   Procedure: Lumbar three-four,  Lumbar four-five,  Lumbar five-Sacrum one posterior lumbar interbody fusion with Laminotomy at Lumbar Two-Three;  Surgeon: Leeroy Cha, MD;  Location: [facility];  Service: Neurosurgery;  Laterality: N/A;    There were no vitals filed for this visit.      Subjective Assessment - 12/09/15 1536    Subjective Patient reports he is getting up and moving around the home and going out in the yard to get paper and mail. Feet continue to improve with less swelling and still no strength in left leg. He also reports difficulty with lifting right foot occasionally and drags his foot because of this.    Limitations Lifting;Standing;Walking;House hold activities;Other (comment)   Patient Stated Goals get back  to full function for work and be able to walk without AD   Currently in Pain? Yes   Pain Score 4    Pain Location Back   Pain Descriptors / Indicators Aching   Pain Type Surgical pain   Pain Onset More than a month ago   Pain Frequency Intermittent        Objective: Gait: ambulating independently with rolling walker, back brace in place, improved cadence and more erect posture Strength; left hip flexion 3-/5, knee extension 4-/5, knee flexion 4/5 (improved since initial evaluation) Outcome measures: MODI 50% (moderate self perceived disability 10MW: 11.5 seconds (with rolling walker) (.81ms: WFL for functional community ambulation): in need of intervention to be able to walk with least restrictive device safely   Treatment: Therapeutic exercise: patient performed exercises with guidance, verbal and tactile cues and demonstration of PT: Back brace worn through part of session  Sitting: on elevated treatment table LAQ with 4# ankle weights 2 x 15 reps each LE Knee flexion with resistive band (red) 2 x 25 reps Hip flexion in sitting: x 5 reps with isometric hold end range and eccentric lowering hip abduction in sitting (clam)with guidance of therapist with isometric hold at end range 5 seconds x 10 reps Scapular retraction with double red resistive band 2 x 15 reps Straight arm pull downs with double red resistive band x 15 Bilateral flexion holding  ball touch top of head x 10  Sit <>stand from elevated mat (~24")  With ball between knees x 15 reps with correct technique, press weight through heels Side step on airex balance beam x 2 min. with UE support at counter Step ups 6" step leading with right LE x 10 reps, 4" step leading with left LE x 10 reps Step up and over x 10 reps with CG for safety 4" step  Patient response to treatment: Patient demonstrates improvement with right and left LE strength. He requires assistance with exercises to perform with correct alignment,  and correct intensity to perform with correct muscle activitation without back pain. Fatigued back with step up exercises today.         PT Education - 12/09/15 1600    Education provided Yes   Education Details HEP: step ups and lateral step ups: continue with 3" step. work on posture with sitting/standing   Person(s) Educated Patient   Methods Explanation;Demonstration;Verbal cues   Comprehension Verbalized understanding;Returned demonstration;Verbal cues required             PT Long Term Goals - 12/09/15 1640      PT LONG TERM GOAL #1   Title Patient will be able to walk safely, erect posture with appropriate AD on level surfaces >600' and stairs by 12/17/2015    Baseline using rolling walker (front wheeled) on level surfaces short distances ~300 feet with forward flexed posture   Status Revised     PT LONG TERM GOAL #2   Title Patient will demonstrate improved function for daily activities with mild pain as indicated by modified oswestry score of 40% or less by 12/17/2015   Baseline MODI 62% (severe self perceived disability); MODI 12/05/15 50%   Status Partially Met     PT LONG TERM GOAL #3   Title Patient will demonstrate functional community ambulation and low fall rist by 10MW of 10 seconds or less by 12/17/2015   Baseline 12/09/15 11.5 seconds   Status Partially Met     PT LONG TERM GOAL #4   Title Patient will be independent with home program for exercises and self management by 12/17/2015 to continue with progressive healing once discharged from physical therapy    Baseline limited/no knowledge of appropriate exercises and progression    Status On-going               Plan - 12/09/15 1642    Clinical Impression Statement Patient is progressing well towards goals and continues with weakness in left LE > right LE that is limiting ability to walk without  an AD. He is walking around his home with less assistance of walker and is still walking with walker outside  the home.  His MODI score is 50% self perceived disability and his 10MW is 11.5 seconds which is  [facility] for community ambulation using walker for safety. He continues to require physical therapy intervention for guidance and instruction to futher strengthen core and LE's s/p back surgery in order to walk with less assistance and be able to perform daily tasks without difficulty.    Rehab Potential Good   PT Frequency 2x / week   PT Duration 6 weeks   PT Treatment/Interventions Patient/family education;Gait training;Cryotherapy;Therapeutic activities;Therapeutic exercise;Neuromuscular re-education;Scar mobilization;Manual techniques;Moist Heat   PT Next Visit Plan progress exercises, core control, gait training, weight shifting   PT Home Exercise Plan core control, LE exercises, added sit to stand off high seat  Patient will benefit from skilled therapeutic intervention in order to improve the following deficits and impairments:  Decreased strength, Decreased activity tolerance, Decreased knowledge of precautions, Pain, Decreased endurance, Decreased range of motion, Difficulty walking, Increased muscle spasms  Visit Diagnosis: Difficulty in walking, not elsewhere classified  Muscle weakness (generalized)  Bilateral low back pain, with sciatica presence unspecified       G-Codes - 12/10/2015 1643    Functional Assessment Tool Used MODI, pain scale, ROM, strength deficits   Functional Limitation Mobility: Walking and moving around   Mobility: Walking and Moving Around Current Status [phone]) At least 40 percent but less than 60 percent impaired, limited or restricted   Mobility: Walking and Moving Around Goal Status [phone]) At least 20 percent but less than 40 percent impaired, limited or restricted      Problem List Patient Active Problem List   Diagnosis Date Noted  . Cellulitis 11/27/2015  . Pedal edema 11/27/2015  . Adjustment disorder with depressed mood   . Osteoarthritis of  spine with radiculopathy, lumbar region 10/15/2015  . Abnormal MRI   . Slurred speech   . Surgery, elective   . Loose stools   . OSA (obstructive sleep apnea)   . Acute blood loss anemia   . Pneumonia   . Urinary retention   . Lewy body dementia   . Encephalopathy   . Altered mental status   . Acute encephalopathy 10/12/2015  . Hypokalemia 10/12/2015  . Anemia 10/12/2015  . S/P lumbar fusion 10/12/2015  . Acute respiratory failure ([place])   . Degenerative disc disease, lumbar 10/01/2015  . Sciatica of right side associated with disorder of lumbosacral spine 07/21/2015  . Hypertension 02/18/2015  . Diabetes ([place]) 02/18/2015  . Gout 02/18/2015  . Hyperlipidemia 02/18/2015  . Arthritis 02/18/2015  . Encounter to establish care 02/18/2015    Jomarie Longs PT 12/10/2015, 11:16 AM  [facility] [address], [place], [address] Phone: [phone]   Fax:  [phone]  Name: Danny Patterson MRN: [id number] Date of Birth: [birth year]

## 2015-12-10 ENCOUNTER — Encounter: Payer: Self-pay | Admitting: Physical Therapy

## 2015-12-10 ENCOUNTER — Ambulatory Visit: Payer: PPO | Admitting: Physical Therapy

## 2015-12-10 DIAGNOSIS — M545 Low back pain: Secondary | ICD-10-CM

## 2015-12-10 DIAGNOSIS — M6281 Muscle weakness (generalized): Secondary | ICD-10-CM

## 2015-12-10 DIAGNOSIS — R262 Difficulty in walking, not elsewhere classified: Secondary | ICD-10-CM

## 2015-12-10 NOTE — Therapy (Signed)
[facility] [address] [place], [place], [address] Phone: [phone]   Fax:  [phone]  Physical Therapy Treatment  Patient Details  Name: Danny Patterson MRN: [id number] Date of Birth: 11/18/1945 Referring Provider: Botero, Ernesto M. MD  Encounter Date: 12/10/2015      PT End of Session - 12/10/15 1618    Visit Number 11   Number of Visits 12   Date for PT Re-Evaluation 12/17/15   Authorization Type 11   Authorization Time Period 20 (G code)   PT Start Time 1530   PT Stop Time 1611   PT Time Calculation (min) 41 min   Activity Tolerance Patient tolerated treatment well;Patient limited by fatigue   Behavior During Therapy WFL for tasks assessed/performed      Past Medical History:  Diagnosis Date  . Allergy   . Arthritis   . Diabetes mellitus without complication ([facility])   . Gout   . Hyperlipidemia   . Hypertension   . Kidney stone   . Rheumatic fever     Past Surgical History:  Procedure Laterality Date  . BACK SURGERY    . knot     removed from neck  . POSTERIOR LUMBAR FUSION 4 LEVEL N/A 10/01/2015   Procedure: Lumbar three-four,  Lumbar four-five,  Lumbar five-Sacrum one posterior lumbar interbody fusion with Laminotomy at Lumbar Two-Three;  Surgeon: Ernesto Botero, MD;  Location: [facility];  Service: Neurosurgery;  Laterality: N/A;    There were no vitals filed for this visit.      Subjective Assessment - 12/10/15 1532    Subjective Getting out more and feeling better in general. Back still gets fatigued with prolonged standing, moving. He was up and moving after therapy yesterday until 9pm and therefore may be a little tired today.   Limitations Lifting;Standing;Walking;House hold activities;Other (comment)   Patient Stated Goals get back to full function for work and be able to walk without AD   Currently in Pain? Yes   Pain Score 4    Pain Location Back   Pain Orientation Right   Pain Descriptors / Indicators Aching   Pain Type Surgical pain   Pain Onset More than a month ago      Objective:  Treatment: Therapeutic exercise: patient performed exercises with guidance, verbal and tactile cues and demonstration of PT: Back brace worn through part of session (standing exercises at start of treatment)  Sitting: on elevated treatment table LAQ with 2# ankle weights 1 x 15 reps each LE Knee flexion with resistive band (red) 1 x 25 reps Hip flexion in sitting: x 5 reps with isometric hold end range and eccentric lowering hip abduction in sitting (clam)with guidance of therapist with isometric hold at end range 5 seconds x 10 reps Scapular retraction with single red resistive band 1 x 15 reps Single arm row with double red resistive band x 15 reps each UE  Sit <>stand from elevated mat (~24")  x 15 reps with correct technique, press weight through heels Side step on airex balance beam x 2 min.with UE support at counter Step ups 4" step leading with right LE x 10 reps, 4" step leading with left LE x 10 reps (each holding 3# weight) Standing balance ball toss staggered stance with each Le placed behind x 15 tosses Seated ball toss with trunk unsupported, no brace in use x 15 tosses  Patient response to treatment: patient fatigued with left LE step   ups due to extended activity and exercise yesterday demonstrating decreased endurance. He is performing all exercises with decreased use of back brace and minimal/moderate cuing for correct posture, alignment of trunk and extremities and sequencing.         PT Education - 12/10/15 1600    Education provided Yes   Education Details HEP: continue with exercises as instructed, rest between exercises   Person(s) Educated Patient   Methods Explanation;Demonstration;Verbal cues   Comprehension Verbalized understanding;Returned demonstration;Verbal cues required             PT Long Term Goals - 12/09/15 1640       PT LONG TERM GOAL #1   Title Patient will be able to walk safely, erect posture with appropriate AD on level surfaces >600' and stairs by 12/17/2015    Baseline using rolling walker (front wheeled) on level surfaces short distances ~300 feet with forward flexed posture   Status Revised     PT LONG TERM GOAL #2   Title Patient will demonstrate improved function for daily activities with mild pain as indicated by modified oswestry score of 40% or less by 12/17/2015   Baseline MODI 62% (severe self perceived disability); MODI 12/05/15 50%   Status Partially Met     PT LONG TERM GOAL #3   Title Patient will demonstrate functional community ambulation and low fall rist by 10MW of 10 seconds or less by 12/17/2015   Baseline 12/09/15 11.5 seconds   Status Partially Met     PT LONG TERM GOAL #4   Title Patient will be independent with home program for exercises and self management by 12/17/2015 to continue with progressive healing once discharged from physical therapy    Baseline limited/no knowledge of appropriate exercises and progression    Status On-going               Plan - 12/10/15 1619    Clinical Impression Statement Patient continues to improve with strength and endurance. He continues with significant weakness in core and left LE > right LE and requires guidance for appropriate exercises to improve endurance and strength in order to improve functional limitations.   Rehab Potential Good   PT Frequency 2x / week   PT Duration 6 weeks   PT Treatment/Interventions Patient/family education;Gait training;Cryotherapy;Therapeutic activities;Therapeutic exercise;Neuromuscular re-education;Scar mobilization;Manual techniques;Moist Heat   PT Next Visit Plan progress exercises, core control, gait training, weight shifting   PT Home Exercise Plan core control, LE exercises, added sit to stand off high seat      Patient will benefit from skilled therapeutic intervention in order to improve  the following deficits and impairments:  Decreased strength, Decreased activity tolerance, Decreased knowledge of precautions, Pain, Decreased endurance, Decreased range of motion, Difficulty walking, Increased muscle spasms  Visit Diagnosis: Muscle weakness (generalized)  Difficulty in walking, not elsewhere classified  Bilateral low back pain, with sciatica presence unspecified   Problem List Patient Active Problem List   Diagnosis Date Noted  . Cellulitis 11/27/2015  . Pedal edema 11/27/2015  . Adjustment disorder with depressed mood   . Osteoarthritis of spine with radiculopathy, lumbar region 10/15/2015  . Abnormal MRI   . Slurred speech   . Surgery, elective   . Loose stools   . OSA (obstructive sleep apnea)   . Acute blood loss anemia   . Pneumonia   . Urinary retention   . Lewy body dementia   . Encephalopathy   . Altered mental status   . Acute   encephalopathy 10/12/2015  . Hypokalemia 10/12/2015  . Anemia 10/12/2015  . S/P lumbar fusion 10/12/2015  . Acute respiratory failure ([facility])   . Degenerative disc disease, lumbar 10/01/2015  . Sciatica of right side associated with disorder of lumbosacral spine 07/21/2015  . Hypertension 02/18/2015  . Diabetes ([facility]) 02/18/2015  . Gout 02/18/2015  . Hyperlipidemia 02/18/2015  . Arthritis 02/18/2015  . Encounter to establish care 02/18/2015    Brooks, Marie PT 12/10/2015, 4:22 PM  [facility] [address] [place], [place], [address] Phone: [phone]   Fax:  [phone]  Name: Danny Patterson MRN: [id number] Date of Birth: 05/06/1945    

## 2015-12-11 ENCOUNTER — Encounter: Payer: PPO | Admitting: Physical Therapy

## 2015-12-11 DIAGNOSIS — M5137 Other intervertebral disc degeneration, lumbosacral region: Secondary | ICD-10-CM | POA: Diagnosis not present

## 2015-12-15 ENCOUNTER — Ambulatory Visit: Payer: PPO | Admitting: Physical Therapy

## 2015-12-15 ENCOUNTER — Encounter: Payer: Self-pay | Admitting: Physical Therapy

## 2015-12-15 DIAGNOSIS — M545 Low back pain: Secondary | ICD-10-CM

## 2015-12-15 DIAGNOSIS — M6281 Muscle weakness (generalized): Secondary | ICD-10-CM | POA: Diagnosis not present

## 2015-12-15 DIAGNOSIS — R262 Difficulty in walking, not elsewhere classified: Secondary | ICD-10-CM

## 2015-12-15 NOTE — Therapy (Signed)
[facility] [address], [place], [address] Phone: [phone]   Fax:  [phone]  Physical Therapy Treatment  Patient Details  Name: Danny Patterson MRN: [id number] Date of Birth: July 19, 1945 Referring Provider: Floyce Stakes MD  Encounter Date: 12/15/2015      PT End of Session - 12/15/15 1602    Visit Number 12   Number of Visits 12   Date for PT Re-Evaluation 12/17/15   Authorization Type 12   Authorization Time Period 20 (G code)   PT Start Time 1535   PT Stop Time 1615   PT Time Calculation (min) 40 min   Activity Tolerance Patient tolerated treatment well;Patient limited by fatigue   Behavior During Therapy [facility] for tasks assessed/performed      Past Medical History:  Diagnosis Date  . Allergy   . Arthritis   . Diabetes mellitus without complication ([place])   . Gout   . Hyperlipidemia   . Hypertension   . Kidney stone   . Rheumatic fever     Past Surgical History:  Procedure Laterality Date  . BACK SURGERY    . knot     removed from neck  . POSTERIOR LUMBAR FUSION 4 LEVEL N/A 10/01/2015   Procedure: Lumbar three-four,  Lumbar four-five,  Lumbar five-Sacrum one posterior lumbar interbody fusion with Laminotomy at Lumbar Two-Three;  Surgeon: Leeroy Cha, MD;  Location: [facility];  Service: Neurosurgery;  Laterality: N/A;    There were no vitals filed for this visit.      Subjective Assessment - 12/15/15 1536    Subjective Patient was seen by MD and is now able to go without brace as tolerated. He is still feeling tired in back with prolonged standing and moving with daily tasks at home. Overall he is seeing imrpvement with strength with greater ease with walking, moving.    Limitations Lifting;Standing;Walking;House hold activities;Other (comment)   Patient Stated Goals get back to full function for work and be able to walk without AD   Currently in Pain? Yes   Pain Score 4     Pain Location Back   Pain Orientation Right   Pain Descriptors / Indicators Aching   Pain Type Surgical pain   Pain Onset More than a month ago   Pain Frequency Intermittent        Objective: Gait; ambulating independently without AD, back brace in place on arrival: limp to left LE, slumped posture, forward head posture Strength; left hip flexion 3/5 (improved from 3-/5 initially)  Treatment: Therapeutic exercise: patient performed exercises with guidance, verbal and tactile cues and demonstration of PT: Back brace not in use during exercises today Sitting: on elevated treatment table LAQ with 4# ankle weights 2 x 15 reps each LE Knee flexion with resistive band green 1 x 25 reps, red 1 x 25 reps Hip flexion in sitting:left LE 2 x 5 reps with isometric hold end range and eccentric lowering (minimal assistance required) hip abduction in sitting (clam)with guidance of therapist with isometric hold at end range 5 seconds x 10 reps Scapular retraction with single red resistive band 1 x 15 reps Single arm row with double red resistive band x 15 reps each UE Straight arm pull downs with red resistive band x 15 reps  Side step on airex balance beam x 2 min.with UE support at counter Step ups 4" step leading with rightLE x 10 reps, 4" step leading with left  LE x 10 reps  Manual therapy: STM right side lower back with patient positioned in sitting; superficial technique to decrease pain and spasms   Patient response to treatment: Patient demonstrated good position, posture with exercises with VC. He fatigues with exercises. Patient with 50% decreased spasms in back and decreased pain following STM            PT Education - 12/15/15 1726    Education provided Yes   Education Details HEP to continue with exercises as given, rest more and decrease step up exercises to 2-3x/week to help with right side lower back to decrease spasms   Person(s) Educated Patient   Methods  Explanation;Demonstration;Verbal cues   Comprehension Verbalized understanding;Returned demonstration;Verbal cues required             PT Long Term Goals - 12/09/15 1640      PT LONG TERM GOAL #1   Title Patient will be able to walk safely, erect posture with appropriate AD on level surfaces >600' and stairs by 12/17/2015    Baseline using rolling walker (front wheeled) on level surfaces short distances ~300 feet with forward flexed posture   Status Revised     PT LONG TERM GOAL #2   Title Patient will demonstrate improved function for daily activities with mild pain as indicated by modified oswestry score of 40% or less by 12/17/2015   Baseline MODI 62% (severe self perceived disability); MODI 12/05/15 50%   Status Partially Met     PT LONG TERM GOAL #3   Title Patient will demonstrate functional community ambulation and low fall rist by 10MW of 10 seconds or less by 12/17/2015   Baseline 12/09/15 11.5 seconds   Status Partially Met     PT LONG TERM GOAL #4   Title Patient will be independent with home program for exercises and self management by 12/17/2015 to continue with progressive healing once discharged from physical therapy    Baseline limited/no knowledge of appropriate exercises and progression    Status On-going               Plan - 12/15/15 1728    Clinical Impression Statement Patient demonstrated decreased spasms and able to ambulate with improved gait pattern following treatment today. He is progressing well towards goals. He continues with significant weakness and limited ability to walk without AD outside the home. He will continue to benefit from physical therapy intervention to address deficits and progress towards maximal function.    Rehab Potential Good   PT Frequency 2x / week   PT Duration 6 weeks   PT Treatment/Interventions Patient/family education;Gait training;Cryotherapy;Therapeutic activities;Therapeutic exercise;Neuromuscular re-education;Scar  mobilization;Manual techniques;Moist Heat   PT Next Visit Plan progress exercises, core control, gait training, weight shifting; re assess goals, LEFS, MODI and re certification   PT Home Exercise Plan core control, LE exercises, added sit to stand off high seat      Patient will benefit from skilled therapeutic intervention in order to improve the following deficits and impairments:  Decreased strength, Decreased activity tolerance, Decreased knowledge of precautions, Pain, Decreased endurance, Decreased range of motion, Difficulty walking, Increased muscle spasms  Visit Diagnosis: Muscle weakness (generalized)  Difficulty in walking, not elsewhere classified  Bilateral low back pain, with sciatica presence unspecified     Problem List Patient Active Problem List   Diagnosis Date Noted  . Cellulitis 11/27/2015  . Pedal edema 11/27/2015  . Adjustment disorder with depressed mood   . Osteoarthritis of spine with  radiculopathy, lumbar region 10/15/2015  . Abnormal MRI   . Slurred speech   . Surgery, elective   . Loose stools   . OSA (obstructive sleep apnea)   . Acute blood loss anemia   . Pneumonia   . Urinary retention   . Lewy body dementia   . Encephalopathy   . Altered mental status   . Acute encephalopathy 10/12/2015  . Hypokalemia 10/12/2015  . Anemia 10/12/2015  . S/P lumbar fusion 10/12/2015  . Acute respiratory failure ([place])   . Degenerative disc disease, lumbar 10/01/2015  . Sciatica of right side associated with disorder of lumbosacral spine 07/21/2015  . Hypertension 02/18/2015  . Diabetes ([place]) 02/18/2015  . Gout 02/18/2015  . Hyperlipidemia 02/18/2015  . Arthritis 02/18/2015  . Encounter to establish care 02/18/2015    Jomarie Longs PT 12/16/2015, 2:44 PM  [facility] [address] [place], [address] Phone: [phone]   Fax:  [phone]  Name: Danny Patterson MRN:  [id number] Date of Birth: 1946/01/14

## 2015-12-16 ENCOUNTER — Other Ambulatory Visit: Payer: Self-pay | Admitting: Family Medicine

## 2015-12-18 ENCOUNTER — Ambulatory Visit: Payer: PPO | Admitting: Physical Therapy

## 2015-12-18 ENCOUNTER — Encounter: Payer: Self-pay | Admitting: Physical Therapy

## 2015-12-18 DIAGNOSIS — M6281 Muscle weakness (generalized): Secondary | ICD-10-CM | POA: Diagnosis not present

## 2015-12-18 DIAGNOSIS — M545 Low back pain: Secondary | ICD-10-CM

## 2015-12-18 DIAGNOSIS — R262 Difficulty in walking, not elsewhere classified: Secondary | ICD-10-CM

## 2015-12-19 NOTE — Therapy (Signed)
[place] [facility] [address], [place], [address] Phone: [phone]   Fax:  [phone]  Physical Therapy Treatment  Patient Details  Name: Danny Patterson MRN: [id number] Date of Birth: 11/15/1945 Referring Provider: Karn Cassis MD  Encounter Date: 12/18/2015      PT End of Session - 12/18/15 1620    Visit Number 13   Number of Visits 36   Date for PT Re-Evaluation 02/13/16   Authorization Type 13   Authorization Time Period 20 (G code)   PT Start Time 1531   PT Stop Time 1616   PT Time Calculation (min) 45 min   Activity Tolerance Patient tolerated treatment well;Patient limited by fatigue   Behavior During Therapy [facility] for tasks assessed/performed      Past Medical History:  Diagnosis Date  . Allergy   . Arthritis   . Diabetes mellitus without complication (HCC)   . Gout   . Hyperlipidemia   . Hypertension   . Kidney stone   . Rheumatic fever     Past Surgical History:  Procedure Laterality Date  . BACK SURGERY    . knot     removed from neck  . POSTERIOR LUMBAR FUSION 4 LEVEL N/A 10/01/2015   Procedure: Lumbar three-four,  Lumbar four-five,  Lumbar five-Sacrum one posterior lumbar interbody fusion with Laminotomy at Lumbar Two-Three;  Surgeon: Hilda Lias, MD;  Location: [facility];  Service: Neurosurgery;  Laterality: N/A;    There were no vitals filed for this visit.      Subjective Assessment - 12/18/15 1532    Subjective Patient was seen by MD and is now able to go without brace as tolerated. He is still feeling tired in back with prolonged standing and moving with daily tasks at home. Overall he is seeing imrpvement with strength with greater ease with walking, moving.    Limitations Lifting;Standing;Walking;House hold activities;Other (comment)   Patient Stated Goals get back to full function for work and be able to walk without AD   Currently in Pain? Yes   Pain Score 3     Pain Location Back   Pain Orientation Right   Pain Descriptors / Indicators Aching   Pain Type Surgical pain   Pain Onset More than a month ago   Pain Frequency Intermittent      Objective: Gait; ambulating independently without AD, back brace in place on arrival: limp to left LE, slumped posture, more erect from previous session, moderate forward head posture Strength; left hip flexion 3/5 (improved from 3-/5 initially) Modified Oswestry: 50% (was 62% initially demonstrating significant improvement)  Treatment: Therapeutic exercise: patient performed exercises with guidance, verbal and tactile cues and demonstration of PT: Back brace not in use during exercises today Sitting: on elevated treatment table LAQ with 4# ankle weights 2 x 15 reps each LE Knee flexion with resistive band  red 1 x 25 reps Hip flexion in sitting:right and left LE 2 x 5 reps with isometric hold end range and eccentric lowering (minimal assistance required) hip abduction in sitting (clam)with guidance of therapist with isometric hold at end range 5 seconds x 10 reps Scapular retraction with singlered resistive band 1x 15 reps Single arm row with double red resistive band x 15 reps each UE Straight arm pull downs with red resistive band x 15 reps  Side step on airex balance beam x 2 min.with UE support at counter Balance walking on balance stones (4)  5 sets Sit<>stand from elevated treatment table 2 x 5 reps   Patient response to treatment: Patient demonstrated improved endurance, technique with exercises with guidance and moderate VC.         PT Education - 12/18/15 1533    Education provided Yes   Education Details Feet feel numb and feel unstable on feet with stepping, limps to left to clear right foot   Person(s) Educated Patient   Methods Explanation;Demonstration;Verbal cues   Comprehension Verbalized understanding;Returned demonstration;Verbal cues required             PT Long  Term Goals - 12/19/15 2045      PT LONG TERM GOAL #1   Title Patient will be able to walk safely, erect posture with appropriate AD on level surfaces >600' and stairs by 01/16/2016    Baseline using rolling walker (front wheeled) on level surfaces short distances ~300 feet with forward flexed posture; current 12/19/15 ambulaitng without AD short distances: 100 feet   Status Revised     PT LONG TERM GOAL #2   Title Patient will demonstrate improved function for daily activities with mild pain as indicated by modified oswestry score of 30% or less by 02/13/2016   Baseline MODI 62% (severe self perceived disability); MODI 12/05/15 50%   Status Revised     PT LONG TERM GOAL #3   Title Patient will demonstrate functional community ambulation and low fall rist by of 10 seconds or less by 02/13/2016   Baseline 12/09/15 11.5 seconds   Status Revised     PT LONG TERM GOAL #4   Title Patient will be independent with home program for exercises and self management by 02/13/2016 to continue with progressive healing once discharged from physical therapy    Baseline limited/no knowledge of appropriate exercises and progression and requires assistance for performing exercises and progressing intensity appropriately   Status Revised               Plan - 12/18/15 1617    Clinical Impression Statement Patient is progressing with improving strength slowly in left LE and improving function with walking and  performing daily tasks. He is now able to tolerate more intense exercises and requires assistance and guidance for appropriate technique and progression. He may benefit from physical therapy intervention increased to 3x/week to further increase strength with assistance of  therapist for more aggressive therapy interveniton.     Rehab Potential Good   PT Frequency 3x / week   PT Duration 8 weeks   PT Treatment/Interventions Patient/family education;Gait training;Cryotherapy;Therapeutic  activities;Therapeutic exercise;Neuromuscular re-education;Scar mobilization;Manual techniques;Moist Heat   PT Next Visit Plan progress exercises, core control, gait training, weight shifting   PT Home Exercise Plan core control, LE exercises, added sit to stand off high seat   Consulted and Agree with Plan of Care Patient      Patient will benefit from skilled therapeutic intervention in order to improve the following deficits and impairments:  Decreased strength, Decreased activity tolerance, Decreased knowledge of precautions, Pain, Decreased endurance, Decreased range of motion, Difficulty walking, Increased muscle spasms  Visit Diagnosis: Muscle weakness (generalized) - Plan: PT plan of care cert/re-cert  Difficulty in walking, not elsewhere classified - Plan: PT plan of care cert/re-cert  Bilateral low back pain, with sciatica presence unspecified - Plan: PT plan of care cert/re-cert     Problem List Patient Active Problem List   Diagnosis Date Noted  . Cellulitis 11/27/2015  . Pedal edema 11/27/2015  .  Adjustment disorder with depressed mood   . Osteoarthritis of spine with radiculopathy, lumbar region 10/15/2015  . Abnormal MRI   . Slurred speech   . Surgery, elective   . Loose stools   . OSA (obstructive sleep apnea)   . Acute blood loss anemia   . Pneumonia   . Urinary retention   . Lewy body dementia   . Encephalopathy   . Altered mental status   . Acute encephalopathy 10/12/2015  . Hypokalemia 10/12/2015  . Anemia 10/12/2015  . S/P lumbar fusion 10/12/2015  . Acute respiratory failure (HCC)   . Degenerative disc disease, lumbar 10/01/2015  . Sciatica of right side associated with disorder of lumbosacral spine 07/21/2015  . Hypertension 02/18/2015  . Diabetes (HCC) 02/18/2015  . Gout 02/18/2015  . Hyperlipidemia 02/18/2015  . Arthritis 02/18/2015  . Encounter to establish care 02/18/2015    Beacher MayBrooks, Jhovanny Guinta PT 12/19/2015, 9:05 PM  [place] [facility] [address] [place], [name], [address] Phone: [phone]   Fax:  [phone]  Name: Danny Patterson MRN: [id number] Date of Birth: 1946-03-11

## 2015-12-29 ENCOUNTER — Ambulatory Visit: Payer: PPO | Admitting: Physical Therapy

## 2015-12-30 ENCOUNTER — Encounter: Payer: Self-pay | Admitting: Physical Therapy

## 2015-12-30 ENCOUNTER — Ambulatory Visit: Payer: PPO | Attending: Neurosurgery | Admitting: Physical Therapy

## 2015-12-30 DIAGNOSIS — M6281 Muscle weakness (generalized): Secondary | ICD-10-CM

## 2015-12-30 DIAGNOSIS — R262 Difficulty in walking, not elsewhere classified: Secondary | ICD-10-CM | POA: Insufficient documentation

## 2015-12-31 ENCOUNTER — Encounter: Payer: PPO | Admitting: Physical Therapy

## 2015-12-31 NOTE — Therapy (Signed)
[facility] [address] [place], [name], [address] Phone: [phone]   Fax:  [phone]  Physical Therapy Treatment  Patient Details  Name: Danny Patterson MRN: [id number] Date of Birth: 1945/10/17 Referring Provider: Karn CassisBotero, Ernesto M. MD  Encounter Date: 12/30/2015      PT End of Session - 12/30/15 1113    Visit Number 14   Number of Visits 36   Date for PT Re-Evaluation 02/13/16   Authorization Type 14   Authorization Time Period 20 (G code)   PT Start Time 1032   PT Stop Time 1114   PT Time Calculation (min) 42 min   Activity Tolerance Patient tolerated treatment well;Patient limited by fatigue   [facility] assessed/performed      Past Medical History:  Diagnosis Date  . Allergy   . Arthritis   . Diabetes mellitus without complication (HCC)   . Gout   . Hyperlipidemia   . Hypertension   . Kidney stone   . Rheumatic fever     Past Surgical History:  Procedure Laterality Date  . BACK SURGERY    . knot     removed from neck  . POSTERIOR LUMBAR FUSION 4 LEVEL N/A 10/01/2015   Procedure: Lumbar three-four,  Lumbar four-five,  Lumbar five-Sacrum one posterior lumbar interbody fusion with Laminotomy at Lumbar Two-Three;  Surgeon: Hilda LiasErnesto Botero, MD;  Location: [facility];  Service: Neurosurgery;  Laterality: N/A;    There were no vitals filed for this visit.      Subjective Assessment - 12/30/15 1036    Subjective Patient reports he is still having difficulty with right foot dragging with walking and feeling unsteady with walking.    Limitations Lifting;Standing;Walking;House hold activities;Other (comment)   Patient Stated Goals get back to full function for work and be able to walk without AD   Currently in Pain? Yes   Pain Score 5    Pain Location Leg   Pain Orientation Left   Pain Descriptors / Indicators Aching;Other (Comment)  stinging in thigh   Pain Type  Surgical pain   Pain Onset More than a month ago   Pain Frequency Intermittent      Objective: Gait; ambulating independently without AD, slumped posture, more erect from previous session, moderate forward head posture Strength: right ankle Df 3-/5  Treatment: Therapeutic exercise: patient performed exercises with guidance, verbal and tactile cues and demonstration of PT: Back brace not in use during exercises today Sitting: on elevated treatment table DF right ankle with blue resistive band with PF against blue resistive band x 10 reps LAQ with 3# ankle weights 2x 15 reps each LE Knee flexion with resistive band  green 1 x 25 reps hip abduction in sitting (clam)with guidance of therapist with isometric hold at end range 5 seconds x 10 reps Scapular retraction with singleblue resistive band 1x 15 reps Single arm row with double blue resistive band x 15 reps each UE Straight arm pull downs with blue resistive band x 15 reps  Sit<>stand from elevated treatment table 2 x 5 reps Walk across balance stones using UE on counter and chair 5 sets with close supervision, CG x 1    Patient response to treatment: Patient able to perform exercises with assistance, guidance and VC with patient demonstrating improved control and more erect posture following session. DF continued with severe weakness with right foot drag with walking  PT Education - 12/30/15 1058    Education provided Yes   Education Details HEP; work on right foot DF use blue band for stretching and PF    Person(s) Educated Patient   Methods Explanation;Demonstration;Verbal cues   Comprehension Verbalized understanding;Returned demonstration;Verbal cues required             PT Long Term Goals - 12/19/15 2045      PT LONG TERM GOAL #1   Title Patient will be able to walk safely, erect posture with appropriate AD on level surfaces >600' and stairs by 01/16/2016    Baseline using rolling walker (front  wheeled) on level surfaces short distances ~300 feet with forward flexed posture; current 12/19/15 ambulaitng without AD short distances: 100 feet   Status Revised     PT LONG TERM GOAL #2   Title Patient will demonstrate improved function for daily activities with mild pain as indicated by modified oswestry score of 30% or less by 02/13/2016   Baseline MODI 62% (severe self perceived disability); MODI 12/05/15 50%   Status Revised     PT LONG TERM GOAL #3   Title Patient will demonstrate functional community ambulation and low fall rist by of 10 seconds or less by 02/13/2016   Baseline 12/09/15 11.5 seconds   Status Revised     PT LONG TERM GOAL #4   Title Patient will be independent with home program for exercises and self management by 02/13/2016 to continue with progressive healing once discharged from physical therapy    Baseline limited/no knowledge of appropriate exercises and progression and requires assistance for performing exercises and progressing intensity appropriately   Status Revised               Plan - 12/30/15 1114    Clinical Impression Statement Patient demonstrates improvement with posture; able to stand and sit with more erect positioning. He continues with significant weakness in right ankle DF, bilateral LE weakness and requires guidance for all exercises to be performed with good posture/technique.   Rehab Potential Good   PT Frequency 3x / week   PT Duration 8 weeks   PT Treatment/Interventions Patient/family education;Gait training;Cryotherapy;Therapeutic activities;Therapeutic exercise;Neuromuscular re-education;Scar mobilization;Manual techniques;Moist Heat   PT Next Visit Plan progress exercises, core control, gait training, weight shifting   PT Home Exercise Plan core control, LE exercises, added sit to stand off high seat      Patient will benefit from skilled therapeutic intervention in order to improve the following deficits and impairments:   Decreased strength, Decreased activity tolerance, Decreased knowledge of precautions, Pain, Decreased endurance, Decreased range of motion, Difficulty walking, Increased muscle spasms  Visit Diagnosis: Muscle weakness (generalized)  Difficulty in walking, not elsewhere classified     Problem List Patient Active Problem List   Diagnosis Date Noted  . Cellulitis 11/27/2015  . Pedal edema 11/27/2015  . Adjustment disorder with depressed mood   . Osteoarthritis of spine with radiculopathy, lumbar region 10/15/2015  . Abnormal MRI   . Slurred speech   . Surgery, elective   . Loose stools   . OSA (obstructive sleep apnea)   . Acute blood loss anemia   . Pneumonia   . Urinary retention   . Lewy body dementia   . Encephalopathy   . Altered mental status   . Acute encephalopathy 10/12/2015  . Hypokalemia 10/12/2015  . Anemia 10/12/2015  . S/P lumbar fusion 10/12/2015  . Acute respiratory failure (HCC)   . Degenerative disc disease, lumbar  10/01/2015  . Sciatica of right side associated with disorder of lumbosacral spine 07/21/2015  . Hypertension 02/18/2015  . Diabetes (HCC) 02/18/2015  . Gout 02/18/2015  . Hyperlipidemia 02/18/2015  . Arthritis 02/18/2015  . Encounter to establish care 02/18/2015    Beacher May PT 12/31/2015, 11:57 AM  [place] [facility] PHYS[facility] SPORTS MEDICINE [address] [place], [address] Phone: [phone]   Fax:  [phone]  Name: Danny Patterson MRN: [id number] Date of Birth: [birth year]

## 2016-01-01 ENCOUNTER — Encounter: Payer: Self-pay | Admitting: Physical Therapy

## 2016-01-01 ENCOUNTER — Ambulatory Visit: Payer: PPO | Admitting: Physical Therapy

## 2016-01-01 DIAGNOSIS — M6281 Muscle weakness (generalized): Secondary | ICD-10-CM | POA: Diagnosis not present

## 2016-01-01 DIAGNOSIS — R262 Difficulty in walking, not elsewhere classified: Secondary | ICD-10-CM

## 2016-01-01 NOTE — Therapy (Signed)
[place] [name] [address] [place], [address] Phone: [phone]   Fax:  [phone]  Physical Therapy Treatment  Patient Details  Name: Danny Patterson MRN: [id number] Date of Birth: 21-Apr-1945 Referring Provider: Karn Cassis MD  Encounter Date: 01/01/2016      PT End of Session - 01/01/16 1047    Visit Number 15   Number of Visits 36   Date for PT Re-Evaluation 02/13/16   Authorization Type 15   Authorization Time Period 20 (G code)   PT Start Time 1038   PT Stop Time 1120   PT Time Calculation (min) 42 min   Activity Tolerance Patient tolerated treatment well;Patient limited by fatigue   Behavior During Therapy [facility] for tasks assessed/performed      Past Medical History:  Diagnosis Date  . Allergy   . Arthritis   . Diabetes mellitus without complication (HCC)   . Gout   . Hyperlipidemia   . Hypertension   . Kidney stone   . Rheumatic fever     Past Surgical History:  Procedure Laterality Date  . BACK SURGERY    . knot     removed from neck  . POSTERIOR LUMBAR FUSION 4 LEVEL N/A 10/01/2015   Procedure: Lumbar three-four,  Lumbar four-five,  Lumbar five-Sacrum one posterior lumbar interbody fusion with Laminotomy at Lumbar Two-Three;  Surgeon: Hilda Lias, MD;  Location: [facility];  Service: Neurosurgery;  Laterality: N/A;    There were no vitals filed for this visit.      Subjective Assessment - 01/01/16 1039    Subjective Patient reports he is feeling a little stiff today. He is able to move his right foot with greater ease today than the other day. He is still concerned about walking and dragging his toe.    Limitations Lifting;Standing;Walking;House hold activities;Other (comment)   Patient Stated Goals get back to full function for work and be able to walk without AD   Currently in Pain? Yes   Pain Score 2    Pain Location Back  and into left leg intermittently   Pain  Orientation Left   Pain Descriptors / Indicators Aching;Other (Comment)  stinging sensation in left thigh   Pain Type Surgical pain  10/01/2015 back Sx   Pain Onset More than a month ago   Pain Frequency Intermittent      Palpation: + tenderness with spasms noted left thigh/quadriceps muscle distally, just superior to patella   Treatment: Therapeutic exercise: patient performed exercises with guidance, verbal and tactile cues and demonstration of PT: STM performed in conjunction with exercises: left quadriceps distal aspect  Sitting: on elevated treatment table Hip adduction with glute sets with ball x 15 reps, hold x 3 seconds each Knee flexion with resistive band green 1x 25 reps hip abduction in sitting (clam)with guidance of therapist with isometric hold at end range 5 seconds x 10 reps Scapular retraction with singleblue resistive band 1x 15 reps Single arm row with double blue resistive band x 15 reps each UE Straight arm pull downs with blue resistive band x 15 reps  Sit<>stand from elevated treatment table 2 x 10 reps, focus on steady, controlled motion especially with lowering Walk across balance stones using UE on counter and chair 5 sets with close supervision, CG x 1    Patient response to treatment: Patient demonstrated good technique with seated exercises and decreased balance with standing/walking on uneven surfaces (balance stones).  He is limited by fatigue for LE exercises, improved DF of right foot noted during session today.Patient required VC throughout session for proper technique of exercises.          PT Education - 01/01/16 1044    Education provided Yes   Education Details HEP: discussed energy conservation and resting in between activity; continue with home exercises for strengthening   Person(s) Educated Patient   Methods Explanation;Demonstration;Verbal cues   Comprehension Verbalized understanding;Returned demonstration;Verbal cues required              PT Long Term Goals - 12/19/15 2045      PT LONG TERM GOAL #1   Title Patient will be able to walk safely, erect posture with appropriate AD on level surfaces >600' and stairs by 01/16/2016    Baseline using rolling walker (front wheeled) on level surfaces short distances ~300 feet with forward flexed posture; current 12/19/15 ambulaitng without AD short distances: 100 feet   Status Revised     PT LONG TERM GOAL #2   Title Patient will demonstrate improved function for daily activities with mild pain as indicated by modified oswestry score of 30% or less by 02/13/2016   Baseline MODI 62% (severe self perceived disability); MODI 12/05/15 50%   Status Revised     PT LONG TERM GOAL #3   Title Patient will demonstrate functional community ambulation and low fall rist by of 10 seconds or less by 02/13/2016   Baseline 12/09/15 11.5 seconds   Status Revised     PT LONG TERM GOAL #4   Title Patient will be independent with home program for exercises and self management by 02/13/2016 to continue with progressive healing once discharged from physical therapy    Baseline limited/no knowledge of appropriate exercises and progression and requires assistance for performing exercises and progressing intensity appropriately   Status Revised               Plan - 01/01/16 1020    Clinical Impression Statement Patient fatigued with exercises due to decreased  endurance and strength s/p spinal surgery. He is progressing well with improving posture with standing and walking and will benefit from continued phyisical therapy intervention to address strength and endurance to improve function with daily tasks at home and in community.   Rehab Potential Good   PT Frequency 3x / week   PT Duration 8 weeks   PT Treatment/Interventions Patient/family education;Gait training;Cryotherapy;Therapeutic activities;Therapeutic exercise;Neuromuscular re-education;Scar mobilization;Manual  techniques;Moist Heat   PT Next Visit Plan progress exercises, core control, gait training, weight shifting      Patient will benefit from skilled therapeutic intervention in order to improve the following deficits and impairments:  Decreased strength, Decreased activity tolerance, Decreased knowledge of precautions, Pain, Decreased endurance, Decreased range of motion, Difficulty walking, Increased muscle spasms  Visit Diagnosis: Muscle weakness (generalized)  Difficulty in walking, not elsewhere classified     Problem List Patient Active Problem List   Diagnosis Date Noted  . Cellulitis 11/27/2015  . Pedal edema 11/27/2015  . Adjustment disorder with depressed mood   . Osteoarthritis of spine with radiculopathy, lumbar region 10/15/2015  . Abnormal MRI   . Slurred speech   . Surgery, elective   . Loose stools   . OSA (obstructive sleep apnea)   . Acute blood loss anemia   . Pneumonia   . Urinary retention   . Lewy body dementia   . Encephalopathy   . Altered mental status   .  Acute encephalopathy 10/12/2015  . Hypokalemia 10/12/2015  . Anemia 10/12/2015  . S/P lumbar fusion 10/12/2015  . Acute respiratory failure (HCC)   . Degenerative disc disease, lumbar 10/01/2015  . Sciatica of right side associated with disorder of lumbosacral spine 07/21/2015  . Hypertension 02/18/2015  . Diabetes (HCC) 02/18/2015  . Gout 02/18/2015  . Hyperlipidemia 02/18/2015  . Arthritis 02/18/2015  . Encounter to establish care 02/18/2015    Beacher MayBrooks, Danny Patterson PT 01/02/2016, 2:02 PM  [place] [facility] [address] [place], [name], [address] Phone: [phone]   Fax:  [phone]  Name: Rupert StacksGary L Knickerbocker MRN: [id number] Date of Birth: Feb 23, 1946

## 2016-01-02 ENCOUNTER — Encounter: Payer: Self-pay | Admitting: Physical Therapy

## 2016-01-02 ENCOUNTER — Ambulatory Visit: Payer: PPO | Admitting: Physical Therapy

## 2016-01-02 DIAGNOSIS — M6281 Muscle weakness (generalized): Secondary | ICD-10-CM

## 2016-01-02 DIAGNOSIS — R262 Difficulty in walking, not elsewhere classified: Secondary | ICD-10-CM

## 2016-01-02 NOTE — Therapy (Signed)
[facility] PHYSICAL AND SPORTS MEDICINE [address] [place], [address] Phone: [phone]   Fax:  [phone]  Physical Therapy Treatment  Patient Details  Name: Danny Patterson MRN: [id number] Date of Birth: 11/21/1945 Referring Provider: Karn Cassis MD  Encounter Date: 01/02/2016      PT End of Session - 01/02/16 1413    Visit Number 16   Number of Visits 36   Date for PT Re-Evaluation 02/13/16   Authorization Type 16   Authorization Time Period 20 (G code)   PT Start Time 1114   PT Stop Time 1147   PT Time Calculation (min) 33 min   Activity Tolerance Patient tolerated treatment well;Patient limited by fatigue   Behavior During Therapy [facility] for tasks assessed/performed      Past Medical History:  Diagnosis Date  . Allergy   . Arthritis   . Diabetes mellitus without complication (HCC)   . Gout   . Hyperlipidemia   . Hypertension   . Kidney stone   . Rheumatic fever     Past Surgical History:  Procedure Laterality Date  . BACK SURGERY    . knot     removed from neck  . POSTERIOR LUMBAR FUSION 4 LEVEL N/A 10/01/2015   Procedure: Lumbar three-four,  Lumbar four-five,  Lumbar five-Sacrum one posterior lumbar interbody fusion with Laminotomy at Lumbar Two-Three;  Surgeon: Hilda Lias, MD;  Location: [facility];  Service: Neurosurgery;  Laterality: N/A;    There were no vitals filed for this visit.      Subjective Assessment - 01/02/16 1125    Subjective Doing better today. Still weak feeling in back with prolonged standing and being up on feet. Able to do more around the house, not normal yet.    Limitations Lifting;Standing;Walking;House hold activities;Other (comment)   Patient Stated Goals get back to full function for work and be able to walk without AD   Currently in Pain? Yes   Pain Score 2    Pain Location Back   Pain Orientation Left   Pain Descriptors / Indicators Aching   Pain Type Surgical  pain  10/01/2015   Pain Onset More than a month ago      Objective:  Posture: standing forward flexed trunk, improving Strength: right LE decreased DF ankle 3+/5, left LE decreased hip flexion 3+/5, knee flexion 4-/5 Gait: decreased trunk rotation, forward flexed trunk, hips with decreased cadence and increased right hip/knee flexion to clear right foot  Treatment: Therapeutic exercise: patient performed exercises with verbal, tactile cues  demonstration of therapist: Sitting: Hip adduction with bal and glute sets x 10 Hip abduction with moderate manual resistance with hold 5 count end range x 10 reps Knee extension with 4# x 15 reps each LE  Knee flexion with resistive band x 25 reps Sit to stand from elevated surface( chair with airex pad) holding 3# weight x 5 reps, no weight x 5 reps Resistive band scapular rows x 15 Straight arm pull downs x 15 Standing: Side stepping along airex balance beam x 2 min. Walk along balance stones (6) with UE on counter, chair x 2 min. At wall with pillow behind head: performed scapular retraction with cervical retraction x 10 reps  Patient response to treatment: patient demonstrated improved technique with exercises with minimal VC for correct alignment. Patient with no increased pain during exercises. Improved gait pattern with more erect posture following session.  PT Education - 01/02/16 1200    Education provided Yes   Education Details HEP: at wall, posture; scapular retraction with cervical retraction with pillow behind head   Person(s) Educated Patient   Methods Explanation;Demonstration;Verbal cues   Comprehension Verbalized understanding;Returned demonstration;Verbal cues required             PT Long Term Goals - 12/19/15 2045      PT LONG TERM GOAL #1   Title Patient will be able to walk safely, erect posture with appropriate AD on level surfaces >600' and stairs by 01/16/2016    Baseline using rolling walker  (front wheeled) on level surfaces short distances ~300 feet with forward flexed posture; current 12/19/15 ambulaitng without AD short distances: 100 feet   Status Revised     PT LONG TERM GOAL #2   Title Patient will demonstrate improved function for daily activities with mild pain as indicated by modified oswestry score of 30% or less by 02/13/2016   Baseline MODI 62% (severe self perceived disability); MODI 12/05/15 50%   Status Revised     PT LONG TERM GOAL #3   Title Patient will demonstrate functional community ambulation and low fall rist by 10MW of 10 seconds or less by 02/13/2016   Baseline 12/09/15 11.5 seconds   Status Revised     PT LONG TERM GOAL #4   Title Patient will be independent with home program for exercises and self management by 02/13/2016 to continue with progressive healing once discharged from physical therapy    Baseline limited/no knowledge of appropriate exercises and progression and requires assistance for performing exercises and progressing intensity appropriately   Status Revised               Plan - 01/02/16 1413    Clinical Impression Statement Improved posture awareness at end of session. focused on standing, balance activities today. improving strength slowly due to extensive spinal surgery with muti level fusion.    Rehab Potential Good   PT Frequency 3x / week   PT Duration 8 weeks   PT Treatment/Interventions Patient/family education;Gait training;Cryotherapy;Therapeutic activities;Therapeutic exercise;Neuromuscular re-education;Scar mobilization;Manual techniques;Moist Heat   PT Next Visit Plan progress exercises, core control, gait training, weight shifting   PT Home Exercise Plan core control, LE exercises, added sit to stand off high seat      Patient will benefit from skilled therapeutic intervention in order to improve the following deficits and impairments:  Decreased strength, Decreased activity tolerance, Decreased knowledge of  precautions, Pain, Decreased endurance, Decreased range of motion, Difficulty walking, Increased muscle spasms  Visit Diagnosis: Muscle weakness (generalized)  Difficulty in walking, not elsewhere classified     Problem List Patient Active Problem List   Diagnosis Date Noted  . Cellulitis 11/27/2015  . Pedal edema 11/27/2015  . Adjustment disorder with depressed mood   . Osteoarthritis of spine with radiculopathy, lumbar region 10/15/2015  . Abnormal MRI   . Slurred speech   . Surgery, elective   . Loose stools   . OSA (obstructive sleep apnea)   . Acute blood loss anemia   . Pneumonia   . Urinary retention   . Lewy body dementia   . Encephalopathy   . Altered mental status   . Acute encephalopathy 10/12/2015  . Hypokalemia 10/12/2015  . Anemia 10/12/2015  . S/P lumbar fusion 10/12/2015  . Acute respiratory failure (HCC)   . Degenerative disc disease, lumbar 10/01/2015  . Sciatica of right side associated with disorder of lumbosacral spine  07/21/2015  . Hypertension 02/18/2015  . Diabetes (HCC) 02/18/2015  . Gout 02/18/2015  . Hyperlipidemia 02/18/2015  . Arthritis 02/18/2015  . Encounter to establish care 02/18/2015    Beacher May PT 01/02/2016, 2:16 PM  [facility] PHYSICAL AND SPORTS MEDICINE [address], [place], [address] Phone: [phone]   Fax:  [phone]  Name: Danny Patterson MRN: [id number] Date of Birth: Feb 13, 1946

## 2016-01-05 ENCOUNTER — Ambulatory Visit: Payer: PPO | Admitting: Physical Therapy

## 2016-01-05 ENCOUNTER — Encounter: Payer: Self-pay | Admitting: Physical Therapy

## 2016-01-05 DIAGNOSIS — R262 Difficulty in walking, not elsewhere classified: Secondary | ICD-10-CM

## 2016-01-05 DIAGNOSIS — M6281 Muscle weakness (generalized): Secondary | ICD-10-CM

## 2016-01-05 NOTE — Therapy (Signed)
[facility] [address] [place], [address] Phone: [phone]   Fax:  [phone]  Physical Therapy Treatment  Patient Details  Name: Danny Patterson MRN: [id number] Date of Birth: 1945/11/19 Referring Provider: Karn Cassis MD  Encounter Date: 01/05/2016      PT End of Session - 01/05/16 1043    Visit Number 17   Number of Visits 36   Date for PT Re-Evaluation 02/13/16   Authorization Type 17   Authorization Time Period 20 (G code)   PT Start Time 1038   PT Stop Time 1120   PT Time Calculation (min) 42 min   Activity Tolerance Patient tolerated treatment well;Patient limited by fatigue   Behavior During Therapy [facility] for tasks assessed/performed      Past Medical History:  Diagnosis Date  . Allergy   . Arthritis   . Diabetes mellitus without complication (HCC)   . Gout   . Hyperlipidemia   . Hypertension   . Kidney stone   . Rheumatic fever     Past Surgical History:  Procedure Laterality Date  . BACK SURGERY    . knot     removed from neck  . POSTERIOR LUMBAR FUSION 4 LEVEL N/A 10/01/2015   Procedure: Lumbar three-four,  Lumbar four-five,  Lumbar five-Sacrum one posterior lumbar interbody fusion with Laminotomy at Lumbar Two-Three;  Surgeon: Hilda Lias, MD;  Location: [facility];  Service: Neurosurgery;  Laterality: N/A;    There were no vitals filed for this visit.      Subjective Assessment - 01/05/16 1040    Subjective Paitent reports he is doing better and still has a weak feeling in his back with standing and doing more around the house.    Limitations Lifting;Standing;Walking;House hold activities;Other (comment)   Patient Stated Goals get back to full function for work and be able to walk without AD   Pain Score 2    Pain Location Back   Pain Orientation Right   Pain Descriptors / Indicators Aching   Pain Type Surgical pain  10/01/2015   Pain Onset More than a  month ago   Pain Frequency Intermittent     Objective: Gait: trunk lean to left, steppage gait pattern right, forward trunk lean, forward flexed at hips Palpation: increased spasms along right thoracic/lumbar spine paraspinal muscles  Treatment: Manual therapy: STM superficial techniques performed along right side thoracic/lumbar paraspinal muscles with patient seated on treatment table; goal: decrease spasms, improve posutre Therapeutic exercise: patient performed exercises with verbal, tactile cues and demonstration of therapist: Sitting: Hip abduction with manual resistance moderate resistance x 10 Knee extension with 3# 2 x 15 Hip flexion with 3# on thighs x 15 reps each Knee flexion with blue resistive band x 25 reps Right ankle DF with isometric hold end range with manual resistance (mild) and eccentric controlled let down x 5 reps Sit to stand from elevated surface (~23") holding 3# weight x 10, no weight x 10 Straight arm pull downs x 15 Standing: Resistive band scapular rows blue resistive band x 15 Bilateral shoulder extension to hips x 15 reps with blue resistive band Posture exercises at wall with pillow behind head; scapular retraction, scaption to 90 degrees both UE's x 10 reps each Standing exercises performed with UE support for balance and close supervision x 1 for safety:  Side stepping along airex balance beam x 2 min. Stepping along balance stones forward and side stepping x  5 reps each  Step ups onto 2" airex balance pad x 10, leading with each LE  Patient response to treatment: patient demonstrated improved technique with exercises with minimal VC for correct alignment.  Patient with decreased spasms by 50% following STM. Improved motor control with repetition and cuing. Patient required close supervision throughout standing exercises for safety due to LE weakness and trunk weakness. Improved erect posture with standing/walking exercises 50% of the time           PT Education - 01/05/16 1043    Education Details HEP: re assessed home exercise and posture correction at wall; added scaption exercises   Person(s) Educated Patient   Methods Explanation;Demonstration;Verbal cues   Comprehension Verbalized understanding;Returned demonstration;Verbal cues required             PT Long Term Goals - 12/19/15 2045      PT LONG TERM GOAL #1   Title Patient will be able to walk safely, erect posture with appropriate AD on level surfaces >600' and stairs by 01/16/2016    Baseline using rolling walker (front wheeled) on level surfaces short distances ~300 feet with forward flexed posture; current 12/19/15 ambulaitng without AD short distances: 100 feet   Status Revised     PT LONG TERM GOAL #2   Title Patient will demonstrate improved function for daily activities with mild pain as indicated by modified oswestry score of 30% or less by 02/13/2016   Baseline MODI 62% (severe self perceived disability); MODI 12/05/15 50%   Status Revised     PT LONG TERM GOAL #3   Title Patient will demonstrate functional community ambulation and low fall rist by 10MW of 10 seconds or less by 02/13/2016   Baseline 12/09/15 11.5 seconds   Status Revised     PT LONG TERM GOAL #4   Title Patient will be independent with home program for exercises and self management by 02/13/2016 to continue with progressive healing once discharged from physical therapy    Baseline limited/no knowledge of appropriate exercises and progression and requires assistance for performing exercises and progressing intensity appropriately   Status Revised               Plan - 01/05/16 1120    Clinical Impression Statement Patient is progressing well with improving strength, posture awareness and endurance with exercises. He continues with decreased strength and endurance and difficulty with walking due to left LE weakness and right ankle weakness and will benefit from additional physical  therapy intervention to improve function with walking and achieve goals.    Rehab Potential Good   PT Frequency 3x / week   PT Duration 8 weeks   PT Treatment/Interventions Patient/family education;Gait training;Cryotherapy;Therapeutic activities;Therapeutic exercise;Neuromuscular re-education;Scar mobilization;Manual techniques;Moist Heat   PT Next Visit Plan progress exercises, core control, gait training, weight shifting   PT Home Exercise Plan core control, LE exercises, added sit to stand off high seat      Patient will benefit from skilled therapeutic intervention in order to improve the following deficits and impairments:  Decreased strength, Decreased activity tolerance, Decreased knowledge of precautions, Pain, Decreased endurance, Decreased range of motion, Difficulty walking, Increased muscle spasms  Visit Diagnosis: Muscle weakness (generalized)  Difficulty in walking, not elsewhere classified     Problem List Patient Active Problem List   Diagnosis Date Noted  . Cellulitis 11/27/2015  . Pedal edema 11/27/2015  . Adjustment disorder with depressed mood   . Osteoarthritis of spine with radiculopathy, lumbar region 10/15/2015  .  Abnormal MRI   . Slurred speech   . Surgery, elective   . Loose stools   . OSA (obstructive sleep apnea)   . Acute blood loss anemia   . Pneumonia   . Urinary retention   . Lewy body dementia   . Encephalopathy   . Altered mental status   . Acute encephalopathy 10/12/2015  . Hypokalemia 10/12/2015  . Anemia 10/12/2015  . S/P lumbar fusion 10/12/2015  . Acute respiratory failure (HCC)   . Degenerative disc disease, lumbar 10/01/2015  . Sciatica of right side associated with disorder of lumbosacral spine 07/21/2015  . Hypertension 02/18/2015  . Diabetes (HCC) 02/18/2015  . Gout 02/18/2015  . Hyperlipidemia 02/18/2015  . Arthritis 02/18/2015  . Encounter to establish care 02/18/2015    Beacher May PT 01/05/2016, 2:06 PM  [facility] PHYSICAL AND SPORTS MEDICINE [address] [place], [address] Phone: [phone]   Fax:  [phone]  Name: Danny Patterson MRN: [id number] Date of Birth: 08-13-1945

## 2016-01-07 ENCOUNTER — Ambulatory Visit: Payer: PPO | Admitting: Physical Therapy

## 2016-01-07 DIAGNOSIS — M6281 Muscle weakness (generalized): Secondary | ICD-10-CM

## 2016-01-07 DIAGNOSIS — R262 Difficulty in walking, not elsewhere classified: Secondary | ICD-10-CM

## 2016-01-08 NOTE — Therapy (Signed)
[facility] PHYS[facility]ORTS MEDICINE [address] [place], [name], [address] Phone: [phone]   Fax:  [phone]  Physical Therapy Treatment  Patient Details  Name: Danny Patterson MRN: [id number] Date of Birth: 1945/09/01 Referring Provider: Karn CassisBotero, Ernesto M. MD  Encounter Date: 01/07/2016      PT End of Session - 01/07/16 1125    Visit Number 18   Number of Visits 36   Date for PT Re-Evaluation 02/13/16   Authorization Type 18   Authorization Time Period 20 (G code)   PT Start Time 1038   PT Stop Time 1118   PT Time Calculation (min) 40 min   Activity Tolerance Patient tolerated treatment well;Patient limited by fatigue   Behavior [facility] Therapy [facility] for tasks assessed/performed      Past Medical History:  Diagnosis Date  . Allergy   . Arthritis   . Diabetes mellitus without complication (HCC)   . Gout   . Hyperlipidemia   . Hypertension   . Kidney stone   . Rheumatic fever     Past Surgical History:  Procedure Laterality Date  . BACK SURGERY    . knot     removed from neck  . POSTERIOR LUMBAR FUSION 4 LEVEL N/A 10/01/2015   Procedure: Lumbar three-four,  Lumbar four-five,  Lumbar five-Sacrum one posterior lumbar interbody fusion with Laminotomy at Lumbar Two-Three;  Surgeon: Hilda LiasErnesto Botero, MD;  Location: [facility];  Service: Neurosurgery;  Laterality: N/A;    There were no vitals filed for this visit.      Subjective Assessment - 01/07/16 1045    Subjective Patient reports he is having difficulty with rising from sit and feels this is because he is still week from surgery   Limitations Lifting;Standing;Walking;House hold activities;Other (comment)   Patient Stated Goals get back to full function for work and be able to walk without AD   Currently in Pain? No/denies         Objective: Gait: Improved posture with more erect posture with decreased forward trunk flexion,forward flexed at hips Palpation:  increased spasms along right thoracic/lumbar spine paraspinal muscles  Treatment: Manual therapy: STM superficial techniques performed along right side thoracic/lumbar paraspinal muscles with patient seated on treatment table; goal: decrease spasms, improve posutre Therapeutic exercise: patient performed exercises with verbal, tactile cues and demonstration of therapist: Sitting: Hip abduction with manual resistance moderate resistance x 10 Knee extension with 4# 2 x 15 Knee flexion with blue resistive band x 25 reps Right ankle DF with isometric hold end range with manual resistance (mild) and eccentric controlled let down x 5 reps Sit to stand from elevated surface (~23") holding 3# weight x 10, no weight x 10 Straight arm pull downs x 15 with blue resistive band Standing: Resistive band scapular rows blue resistive band x 15 Bilateral shoulder extension to hips x 15 reps with blue resistive band Posture exercises at wall with pillow behind head; scapular retraction, scaption to 90 degrees both UE's x 10 reps each Standing exercises performed with UE support for balance and close supervision x 1 for safety:  Side stepping along airex balance beam x 2 min. Stepping along balance stones forward and side stepping x 5 reps each  Step ups onto 2" airex balance pad x 10, leading with each LE  Patient response to treatment: patient demonstrated improved technique with exercises with minimal VC for correct alignment.  Patient with decreased spasms by 50% following STM.  Patient  required close supervision throughout standing exercises for safety due to LE weakness and trunk weakness.Improved posture awareness with self correction >50% of the time and with minimal cuing for all standing and sitting exercises         PT Education - 01/07/16 1121    Education provided Yes   Education Details HEP: re asssessed exercises for strength, walking and balnce in sitting/standing   Person(s) Educated  Patient   Methods Explanation;Demonstration;Verbal cues   Comprehension Verbalized understanding;Returned demonstration;Verbal cues required             PT Long Term Goals - 12/19/15 2045      PT LONG TERM GOAL #1   Title Patient will be able to walk safely, erect posture with appropriate AD on level surfaces >600' and stairs by 01/16/2016    Baseline using rolling walker (front wheeled) on level surfaces short distances ~300 feet with forward flexed posture; current 12/19/15 ambulaitng without AD short distances: 100 feet   Status Revised     PT LONG TERM GOAL #2   Title Patient will demonstrate improved function for daily activities with mild pain as indicated by modified oswestry score of 30% or less by 02/13/2016   Baseline MODI 62% (severe self perceived disability); MODI 12/05/15 50%   Status Revised     PT LONG TERM GOAL #3   Title Patient will demonstrate functional community ambulation and low fall rist by of 10 seconds or less by 02/13/2016   Baseline 12/09/15 11.5 seconds   Status Revised     PT LONG TERM GOAL #4   Title Patient will be independent with home program for exercises and self management by 02/13/2016 to continue with progressive healing once discharged from physical therapy    Baseline limited/no knowledge of appropriate exercises and progression and requires assistance for performing exercises and progressing intensity appropriately   Status Revised               Plan - 01/07/16 1133    Clinical Impression Statement Patient continues to progress well towards goals for walking without AD with more erect posture and endurance. He continues with decreased motor control, endurance and strength in core and will benefit from additional physical therapy interventoin to progress to maximal function with daily tasks.    Rehab Potential Good   PT Frequency 3x / week   PT Duration 8 weeks   PT Treatment/Interventions Patient/family education;Gait  training;Cryotherapy;Therapeutic activities;Therapeutic exercise;Neuromuscular re-education;Scar mobilization;Manual techniques;Moist Heat   PT Next Visit Plan progress exercises, core control, gait training, weight shifting   PT Home Exercise Plan core control, LE exercises, added sit to stand off high seat      Patient will benefit from skilled therapeutic intervention in order to improve the following deficits and impairments:  Decreased strength, Decreased activity tolerance, Decreased knowledge of precautions, Pain, Decreased endurance, Decreased range of motion, Difficulty walking, Increased muscle spasms  Visit Diagnosis: Muscle weakness (generalized)  Difficulty in walking, not elsewhere classified     Problem List Patient Active Problem List   Diagnosis Date Noted  . Cellulitis 11/27/2015  . Pedal edema 11/27/2015  . Adjustment disorder with depressed mood   . Osteoarthritis of spine with radiculopathy, lumbar region 10/15/2015  . Abnormal MRI   . Slurred speech   . Surgery, elective   . Loose stools   . OSA (obstructive sleep apnea)   . Acute blood loss anemia   . Pneumonia   . Urinary retention   .  Lewy body dementia   . Encephalopathy   . Altered mental status   . Acute encephalopathy 10/12/2015  . Hypokalemia 10/12/2015  . Anemia 10/12/2015  . S/P lumbar fusion 10/12/2015  . Acute respiratory failure (HCC)   . Degenerative disc disease, lumbar 10/01/2015  . Sciatica of right side associated with disorder of lumbosacral spine 07/21/2015  . Hypertension 02/18/2015  . Diabetes (HCC) 02/18/2015  . Gout 02/18/2015  . Hyperlipidemia 02/18/2015  . Arthritis 02/18/2015  . Encounter to establish care 02/18/2015    Beacher May PT 01/08/2016, 2:26 PM  [facility] PH[facility]ORTS MEDICINE [address] [place], [address] Phone: [phone]   Fax:  [phone]  Name: Danny Patterson MRN: [id number] Date of  Birth: 1945/09/01

## 2016-01-09 ENCOUNTER — Encounter: Payer: Self-pay | Admitting: Physical Therapy

## 2016-01-09 ENCOUNTER — Ambulatory Visit: Payer: PPO | Admitting: Physical Therapy

## 2016-01-09 DIAGNOSIS — M6281 Muscle weakness (generalized): Secondary | ICD-10-CM

## 2016-01-09 DIAGNOSIS — R262 Difficulty in walking, not elsewhere classified: Secondary | ICD-10-CM

## 2016-01-09 NOTE — Therapy (Signed)
[facility] PHYSICAL AND SPORTS MEDICINE [address] [place], [name], [address] [facility]: [phone]   Fax:  [phone]  Physical Therapy Treatment  Patient Details  Name: Danny Patterson MRN: [id number] Date of Birth: Sep 11, 1945 Referring Provider: Karn CassisBotero, Ernesto M. MD  Encounter Date: 01/09/2016      PT End of Session - 01/09/16 1120    Visit Number 19   Number of Visits 36   Date for PT Re-Evaluation 02/13/16   Authorization Type 19   Authorization Time Period 20 (G code)   PT Start Time 1040   PT Stop Time 1120   PT Time Calculation (min) 40 min   Activity Tolerance Patient tolerated treatment well;Patient limited by fatigue   Behavior [facility] for tasks assessed/performed      Past Medical History:  Diagnosis Date  . Allergy   . Arthritis   . Diabetes mellitus without complication (HCC)   . Gout   . Hyperlipidemia   . Hypertension   . Kidney stone   . Rheumatic fever     Past Surgical History:  Procedure Laterality Date  . BACK SURGERY    . knot     removed from neck  . POSTERIOR LUMBAR FUSION 4 LEVEL N/A 10/01/2015   Procedure: Lumbar three-four,  Lumbar four-five,  Lumbar five-Sacrum one posterior lumbar interbody fusion with Laminotomy at Lumbar Two-Three;  Surgeon: Hilda LiasErnesto Botero, MD;  Location: [facility];  Service: Neurosurgery;  Laterality: N/A;    There were no vitals filed for this visit.      Subjective Assessment - 01/09/16 1045    Subjective Patient reports he is having more right sided hip pain today, stiffness and a little painful.   Limitations Lifting;Standing;Walking;House hold activities;Other (comment)   Patient Stated Goals get back to full function for work and be able to walk without AD   Currently in Pain? No/denies      Objective: Gait: Antalgic gait with decreased weight shift to right LE and increased latera trunk flexion to right Palpation: mild to moderate spasms along right  side lower thoracic, upper lumbar paraspinal muscles  Treatment: Manual therapy: STM superficial techniques performed along right side thoracic/lumbar paraspinal muscles with patient seated on treatment table; goal: decrease spasms, improve posutre Therapeutic exercise: patient performed exercises with verbal, tactile cues and demonstration of therapist: Sitting: Hip adduction with ball and glute sets seated on elevated treatment table x 20 reps Hip abduction with manual resistance moderate resistance x 10 Knee flexion with blueresistive band x 25 reps Hip flexion x 10 each LE with CG x 1 Right ankle DF with isometric hold end range with manual resistance (mild) and eccentric controlled let down x 5 reps Straight arm pull downs x 15 with blue resistive band Resistive band scapular rows blue resistive band x 15  Patient response to treatment: Patient limited to sitting exercises due to increased right hip pain with weight bearing today. Patient required minimal VC/guidance to perform exercises with correct technique. decreased spasms with improved soft tissue elasticity 30% with mild spasms at end of treatment session and improved posture with walking. Continued with increased right hip pain with weight bearing, minimal improvement noted per patient report          PT Education - 01/09/16 1120    Education provided Yes   Education Details HEP: rest, use of heat for pain control, exercise as  tolerated   Person(s) Educated Patient   Methods Explanation;Demonstration;Verbal cues  Comprehension Verbalized understanding;Returned demonstration;Verbal cues required             PT Long Term Goals - 12/19/15 2045      PT LONG TERM GOAL #1   Title Patient will be able to walk safely, erect posture with appropriate AD on level surfaces >600' and stairs by 01/16/2016    Baseline using rolling walker (front wheeled) on level surfaces short distances ~300 feet with forward flexed  posture; current 12/19/15 ambulaitng without AD short distances: 100 feet   Status Revised     PT LONG TERM GOAL #2   Title Patient will demonstrate improved function for daily activities with mild pain as indicated by modified oswestry score of 30% or less by 02/13/2016   Baseline MODI 62% (severe self perceived disability); MODI 12/05/15 50%   Status Revised     PT LONG TERM GOAL #3   Title Patient will demonstrate functional community ambulation and low fall rist by of 10 seconds or less by 02/13/2016   Baseline 12/09/15 11.5 seconds   Status Revised     PT LONG TERM GOAL #4   Title Patient will be independent with home program for exercises and self management by 02/13/2016 to continue with progressive healing once discharged from physical therapy    Baseline limited/no knowledge of appropriate exercises and progression and requires assistance for performing exercises and progressing intensity appropriately   Status Revised               Plan - 01/09/16 1120    Clinical Impression Statement Limited exercises to sitting today due to right hip pain with standing, weight bearing activities. Patient is progressing well with improving posture with walking and improving endurance for walking. Patient continues with decreased strength and endurance for daily tasks at home and outdoors and will benefit from additional physical therapy intervention to achieve maximal function.    Rehab Potential Good   PT Frequency 3x / week   PT Duration 8 weeks   PT Treatment/Interventions Patient/family education;Gait training;Cryotherapy;Therapeutic activities;Therapeutic exercise;Neuromuscular re-education;Scar mobilization;Manual techniques;Moist Heat   PT Next Visit Plan progress exercises, core control, gait training, weight shifting   PT Home Exercise Plan core control, LE exercises, added sit to stand off high seat      Patient will benefit from skilled therapeutic intervention in order  to improve the following deficits and impairments:  Decreased strength, Decreased activity tolerance, Decreased knowledge of precautions, Pain, Decreased endurance, Decreased range of motion, Difficulty walking, Increased muscle spasms  Visit Diagnosis: Difficulty in walking, not elsewhere classified  Muscle weakness (generalized)     Problem List Patient Active Problem List   Diagnosis Date Noted  . Cellulitis 11/27/2015  . Pedal edema 11/27/2015  . Adjustment disorder with depressed mood   . Osteoarthritis of spine with radiculopathy, lumbar region 10/15/2015  . Abnormal MRI   . Slurred speech   . Surgery, elective   . Loose stools   . OSA (obstructive sleep apnea)   . Acute blood loss anemia   . Pneumonia   . Urinary retention   . Lewy body dementia   . Encephalopathy   . Altered mental status   . Acute encephalopathy 10/12/2015  . Hypokalemia 10/12/2015  . Anemia 10/12/2015  . S/P lumbar fusion 10/12/2015  . Acute respiratory failure (HCC)   . Degenerative disc disease, lumbar 10/01/2015  . Sciatica of right side associated with disorder of lumbosacral spine 07/21/2015  . Hypertension 02/18/2015  . Diabetes (HCC) 02/18/2015  .  Gout 02/18/2015  . Hyperlipidemia 02/18/2015  . Arthritis 02/18/2015  . Encounter to establish care 02/18/2015    Beacher May PT 01/10/2016, 4:18 PM  [facility]YSICAL AND SPORTS MEDICINE [address] [place], [address] Phone: [phone]   Fax:  [phone]  Name: Danny Patterson MRN: [id number] Date of Birth: 11-01-1945

## 2016-01-12 ENCOUNTER — Ambulatory Visit: Payer: PPO | Admitting: Physical Therapy

## 2016-01-12 ENCOUNTER — Encounter: Payer: Self-pay | Admitting: Physical Therapy

## 2016-01-12 DIAGNOSIS — R262 Difficulty in walking, not elsewhere classified: Secondary | ICD-10-CM

## 2016-01-12 DIAGNOSIS — M6281 Muscle weakness (generalized): Secondary | ICD-10-CM

## 2016-01-12 NOTE — Therapy (Signed)
[facility] [facility] PHYSICAL AND SPORTS MEDICINE [address] [place], [name], [address] Phone: [phone]   Fax:  [phone]  Physical Therapy Treatment/Progress Report   Patient Details  Name: Danny Patterson MRN: [id number] Date of Birth: 12/19/1945 Referring Provider: Karn CassisBotero, Ernesto M. MD  Encounter Date: 01/12/2016      PT End of Session - 01/12/16 1043    Visit Number 20   Number of Visits 36   Date for PT Re-Evaluation 02/13/16   Authorization Type 20   Authorization Time Period 20 (G code)   PT Start Time 1035   PT Stop Time 1122   PT Time Calculation (min) 47 min   Activity Tolerance Patient tolerated treatment well;Patient limited by fatigue   Behavior During Therapy [facility] for tasks assessed/performed      Past Medical History:  Diagnosis Date  . Allergy   . Arthritis   . Diabetes mellitus without complication (HCC)   . Gout   . Hyperlipidemia   . Hypertension   . Kidney stone   . Rheumatic fever     Past Surgical History:  Procedure Laterality Date  . BACK SURGERY    . knot     removed from neck  . POSTERIOR LUMBAR FUSION 4 LEVEL N/A 10/01/2015   Procedure: Lumbar three-four,  Lumbar four-five,  Lumbar five-Sacrum one posterior lumbar interbody fusion with Laminotomy at Lumbar Two-Three;  Surgeon: Hilda LiasErnesto Botero, MD;  Location: [facility];  Service: Neurosurgery;  Laterality: N/A;    There were no vitals filed for this visit.      Subjective Assessment - 01/12/16 1037    Subjective Paitent reports he is having continued right hip pain (since last week: doing quite a bit at home)that seems to be a little better today and improving since beginning. He is stronger with left LE and continues with decreased confidence with walking primarily because of right ankle weakness and toe drag intermittently.    Limitations Lifting;Standing;Walking;House hold activities;Other (comment)   Patient Stated Goals get back to full function  for work and be able to walk without AD   Currently in Pain? Yes   Pain Score 3    Pain Location Hip   Pain Orientation Right   Pain Descriptors / Indicators Aching   Pain Type Acute pain  Note: back surgery 10/01/15   Pain Onset More than a month ago   Pain Frequency Intermittent      Objective: Gait: Antalgic gait with decreased weight shift to right LE and increased latera trunk flexion to right Palpation: mild to moderate spasms along right side lower thoracic, upper lumbar paraspinal muscles Outcome measure: MODI 48% (severe self perceived disability) Confidence for walking in community without losing balance: 50%  Treatment: Manual therapy: STM superficial techniques performed along right side thoracic/lumbar paraspinal muscles with patient seated on treatment table; goal: decrease spasms, improve posutre Therapeutic exercise: patient performed exercises with verbal, tactile cues and demonstration of therapist: Sitting: Hip abduction with manual resistance moderate resistance x 10 Knee flexion with blueresistive band x 25 reps Hip flexion x 10 each LE with CG x 1 Right ankle DF with isometric hold end range with manual resistance (mild) and eccentric controlled let down x 5 reps, then x 10 AROM through full ROM with fatigue noted near 10th repetition Standing: Side stepping along blue airex balance beam x 2 min. Straight arm pull downs with assistance  x 15 with blue resistive band Resistive band scapular rows blue  resistive band x 15 Staggered standing single arm row with double blue resistive band x 15 reps each UE   Patient response to treatment: patient demonstrated improved technique with exercises with minimal VC for correct alignment with most exercises. Patient with fatigue as end of repetitions with all exercises. Patient withcontinued right hip pain with sit to stand at end of session, no worse. Patient with decreased spasms by 50% following STM.        PT  Education - 02-01-2016 1042    Education provided Yes   Education Details HEP: progress standing, walking as tolerated based on right hip pain   Person(s) Educated Patient   Methods Explanation;Demonstration;Verbal cues   Comprehension Verbalized understanding;Returned demonstration;Verbal cues required             PT Long Term Goals - 02/01/16 1548      PT LONG TERM GOAL #1   Title Patient will be able to walk safely, erect posture with appropriate AD on level surfaces >600' and stairs by 01/16/2016    Baseline Feb 01, 2016 walking without AD, unable to perform 6 mi. walk due to right hip pain with any walking    Status Deferred     PT LONG TERM GOAL #2   Title Patient will demonstrate improved function for daily activities with mild pain as indicated by modified oswestry score of 30% or less by 02/13/2016   Baseline MODI 62% (severe self perceived disability); MODI 12/05/15 50%; Feb 01, 2016 = 48%   Status On-going     PT LONG TERM GOAL #3   Title Patient will demonstrate functional community ambulation and low fall rist by of 10 seconds or less by 02/13/2016   Baseline 12/09/15 11.5 seconds; 2016-02-01 deferred   Status On-going     PT LONG TERM GOAL #4   Title Patient will be independent with home program for exercises and self management by 02/13/2016 to continue with progressive healing once discharged from physical therapy    Baseline Feb 01, 2016 limited knowledge of appropriate exercises and progression and requires assistance for performing exercises and progressing intensity appropriately   Status On-going               Plan - 2016-02-01 1125    Clinical Impression Statement Paitent is progressing well with goals and is limited by right hip pain today. He continues with 50% based on modifies oswestry, confidence, strength deficits and will benefit from continued physical therapy intervention to achieve maximal functional gains s/p spinal surgery.,   Rehab Potential Good    PT Frequency 3x / week   PT Duration 8 weeks   PT Treatment/Interventions Patient/family education;Gait training;Cryotherapy;Therapeutic activities;Therapeutic exercise;Neuromuscular re-education;Scar mobilization;Manual techniques;Moist Heat   PT Next Visit Plan progress exercises, core control, gait training, weight shifting   PT Home Exercise Plan core control, LE exercises, added sit to stand off high seat      Patient will benefit from skilled therapeutic intervention in order to improve the following deficits and impairments:  Decreased strength, Decreased activity tolerance, Decreased knowledge of precautions, Pain, Decreased endurance, Decreased range of motion, Difficulty walking, Increased muscle spasms  Visit Diagnosis: Muscle weakness (generalized)  Difficulty in walking, not elsewhere classified       G-Codes - 01-Feb-2016 1127    Functional Assessment Tool Used MODI, pain scale, ROM, strength deficits   Functional Limitation Mobility: Walking and moving around   Mobility: Walking and Moving Around Current Status ([id number]) At least 40 percent but less than 60 percent impaired, limited  or restricted   Mobility: Walking and Moving Around Goal Status [phone]) At least 1 percent but less than 20 percent impaired, limited or restricted      Problem List Patient Active Problem List   Diagnosis Date Noted  . Cellulitis 11/27/2015  . Pedal edema 11/27/2015  . Adjustment disorder with depressed mood   . Osteoarthritis of spine with radiculopathy, lumbar region 10/15/2015  . Abnormal MRI   . Slurred speech   . Surgery, elective   . Loose stools   . OSA (obstructive sleep apnea)   . Acute blood loss anemia   . Pneumonia   . Urinary retention   . Lewy body dementia   . Encephalopathy   . Altered mental status   . Acute encephalopathy 10/12/2015  . Hypokalemia 10/12/2015  . Anemia 10/12/2015  . S/P lumbar fusion 10/12/2015  . Acute respiratory failure (HCC)   .  Degenerative disc disease, lumbar 10/01/2015  . Sciatica of right side associated with disorder of lumbosacral spine 07/21/2015  . Hypertension 02/18/2015  . Diabetes (HCC) 02/18/2015  . Gout 02/18/2015  . Hyperlipidemia 02/18/2015  . Arthritis 02/18/2015  . Encounter to establish care 02/18/2015    Beacher May PT 01/12/2016, 3:51 PM  [facility] PHYSICAL AND SPORTS MEDICINE [address], [place], [address] Phone: [phone]   Fax:  [phone]  Name: Danny Patterson MRN: [id number] Date of Birth: 1945/11/05

## 2016-01-13 ENCOUNTER — Encounter: Payer: Self-pay | Admitting: *Deleted

## 2016-01-13 ENCOUNTER — Encounter: Payer: Self-pay | Admitting: Family Medicine

## 2016-01-13 ENCOUNTER — Ambulatory Visit (INDEPENDENT_AMBULATORY_CARE_PROVIDER_SITE_OTHER): Payer: PPO | Admitting: Family Medicine

## 2016-01-13 VITALS — BP 137/73 | HR 82 | Temp 97.6°F | Resp 16 | Ht 71.0 in | Wt 237.0 lb

## 2016-01-13 DIAGNOSIS — I10 Essential (primary) hypertension: Secondary | ICD-10-CM

## 2016-01-13 DIAGNOSIS — Z23 Encounter for immunization: Secondary | ICD-10-CM

## 2016-01-13 DIAGNOSIS — M5136 Other intervertebral disc degeneration, lumbar region: Secondary | ICD-10-CM | POA: Diagnosis not present

## 2016-01-13 DIAGNOSIS — R600 Localized edema: Secondary | ICD-10-CM | POA: Diagnosis not present

## 2016-01-13 DIAGNOSIS — E785 Hyperlipidemia, unspecified: Secondary | ICD-10-CM | POA: Diagnosis not present

## 2016-01-13 DIAGNOSIS — E0800 Diabetes mellitus due to underlying condition with hyperosmolarity without nonketotic hyperglycemic-hyperosmolar coma (NKHHC): Secondary | ICD-10-CM | POA: Diagnosis not present

## 2016-01-13 NOTE — Progress Notes (Signed)
Name: Danny Patterson   MRN: [id number]    DOB: [birth year]   Date:01/13/2016       Progress Note  Subjective  Chief Complaint  Chief Complaint  Patient presents with  . Hypertension  . Hyperlipidemia    HPI Here for f/u of HBP, elevated lipids,  Diabetes.  He has not been checking his blood sugars.  He had back surgery 10/01/15 and is still recovering.  He still has neuropathy in legs and has trouble walking.  He requests excusion from jury duty.  No problem-specific Assessment & Plan notes found for this encounter.   Past Medical History:  Diagnosis Date  . Allergy   . Arthritis   . Diabetes mellitus without complication ([place])   . Gout   . Hyperlipidemia   . Hypertension   . Kidney stone   . Rheumatic fever     Past Surgical History:  Procedure Laterality Date  . BACK SURGERY    . knot     removed from neck  . POSTERIOR LUMBAR FUSION 4 LEVEL N/A 10/01/2015   Procedure: Lumbar three-four,  Lumbar four-five,  Lumbar five-Sacrum one posterior lumbar interbody fusion with Laminotomy at Lumbar Two-Three;  Surgeon: Leeroy Cha, MD;  Location: [facility];  Service: Neurosurgery;  Laterality: N/A;    Family History  Problem Relation Age of Onset  . Parkinson's disease Father 11  . Kidney disease Maternal Grandmother     Social History   Social History  . Marital status: Married    Spouse name: N/A  . Number of children: N/A  . Years of education: N/A   Occupational History  . Not on file.   Social History Main Topics  . Smoking status: Never Smoker  . Smokeless tobacco: Never Used  . Alcohol use 1.8 oz/week    3 Cans of beer per week  . Drug use: No  . Sexual activity: Not on file   Other Topics Concern  . Not on file   Social History Narrative  . No narrative on file     Current Outpatient Prescriptions:  .  acetaminophen (TYLENOL) 325 MG tablet, Take 2 tablets (650 mg total) by mouth 4 (four) times daily -  with meals and at bedtime., Disp: , Rfl:   .  amLODipine (NORVASC) 2.5 MG tablet, Take 1 tablet daily, Disp: 30 tablet, Rfl: 12 .  atorvastatin (LIPITOR) 20 MG tablet, Take 1 tablet (20 mg total) by mouth daily at 6 PM., Disp: 30 tablet, Rfl: 12 .  furosemide (LASIX) 20 MG tablet, Take 2 tablets each AM for swelling, Disp: 60 tablet, Rfl: 6 .  gabapentin (NEURONTIN) 100 MG capsule, Take 100 mg by mouth 3 (three) times daily., Disp: , Rfl:  .  KLOR-CON M20 20 MEQ tablet, TAKE 1 TABLET (20 MEQ TOTAL) BY MOUTH DAILY., Disp: 30 tablet, Rfl: 6 .  magnesium gluconate (MAGONATE) 500 MG tablet, Take 500 mg by mouth daily., Disp: , Rfl:  .  metFORMIN (GLUCOPHAGE) 500 MG tablet, Take 1 tablet each AM with breakfast. (Patient taking differently: Take 500 mg by mouth daily with breakfast. Take 1 tablet each AM with breakfast.), Disp: 90 tablet, Rfl: 3 .  methocarbamol (ROBAXIN) 500 MG tablet, TAKE 1 TABLET (500 MG TOTAL) BY MOUTH 3 (THREE) TIMES DAILY. FOR MUSCLE ACHES/SPASMS, Disp: 90 tablet, Rfl: 0 .  tamsulosin (FLOMAX) 0.4 MG CAPS capsule, TAKE 1 CAPSULE (0.4 MG TOTAL) BY MOUTH DAILY., Disp: 30 capsule, Rfl: 6 .  traMADol (ULTRAM) 50  MG tablet, Take 50 mg by mouth every 6 (six) hours as needed., Disp: , Rfl:   Allergies  Allergen Reactions  . Ace Inhibitors Other (See Comments)    Angioedema.     Review of Systems  Constitutional: Negative for chills, fever, malaise/fatigue and weight loss.  HENT: Negative for hearing loss.   Eyes: Negative for blurred vision and double vision.  Respiratory: Negative for cough, shortness of breath and wheezing.   Cardiovascular: Negative for chest pain, palpitations and leg swelling.  Gastrointestinal: Negative for abdominal pain, blood in stool and heartburn.  Genitourinary: Negative for dysuria, frequency and urgency.  Musculoskeletal: Positive for back pain.  Skin: Negative for rash.  Neurological: Positive for weakness. Negative for dizziness, tremors and headaches.       Has bilateral lower  peripheral neuropathy      Objective  Vitals:   01/13/16 1405  BP: 137/73  Pulse: 82  Resp: 16  Temp: 97.6 F (36.4 C)  TempSrc: Oral  Weight: 237 lb (107.5 kg)  Height: '5\' 11"'$  (1.803 m)    Physical Exam  Constitutional: He is oriented to person, place, and time and well-developed, well-nourished, and in no distress. No distress.  HENT:  Head: Normocephalic and atraumatic.  Eyes: Conjunctivae and EOM are normal. Pupils are equal, round, and reactive to light. No scleral icterus.  Neck: Normal range of motion. Neck supple. Carotid bruit is not present. No thyromegaly present.  Cardiovascular: Normal rate, regular rhythm and normal heart sounds.  Exam reveals no gallop and no friction rub.   No murmur heard. Pulmonary/Chest: Effort normal and breath sounds normal. No respiratory distress. He has no wheezes. He has no rales.  Musculoskeletal: He exhibits no edema.  Lymphadenopathy:    He has no cervical adenopathy.  Neurological: He is alert and oriented to person, place, and time.  Vitals reviewed.      Recent Results (from the past 2160 hour(s))  Glucose, capillary     Status: Abnormal   Collection Time: 10/15/15  4:55 PM  Result Value Ref Range   Glucose-Capillary 194 (H) 65 - 99 mg/dL   Comment 1 Notify RN    Comment 2 Document in Chart   Glucose, capillary     Status: Abnormal   Collection Time: 10/15/15  8:41 PM  Result Value Ref Range   Glucose-Capillary 140 (H) 65 - 99 mg/dL   Comment 1 Notify RN   Urinalysis, Routine w reflex microscopic (not at [facility])     Status: None   Collection Time: 10/15/15 11:55 PM  Result Value Ref Range   Color, Urine YELLOW YELLOW   APPearance CLEAR CLEAR   Specific Gravity, Urine 1.017 1.005 - 1.030   pH 6.0 5.0 - 8.0   Glucose, UA NEGATIVE NEGATIVE mg/dL   Hgb urine dipstick NEGATIVE NEGATIVE   Bilirubin Urine NEGATIVE NEGATIVE   Ketones, ur NEGATIVE NEGATIVE mg/dL   Protein, ur NEGATIVE NEGATIVE mg/dL   Nitrite NEGATIVE  NEGATIVE   Leukocytes, UA NEGATIVE NEGATIVE    Comment: MICROSCOPIC NOT DONE ON URINES WITH NEGATIVE PROTEIN, BLOOD, LEUKOCYTES, NITRITE, OR GLUCOSE <1000 mg/dL.  Urine culture     Status: None   Collection Time: 10/15/15 11:56 PM  Result Value Ref Range   Specimen Description URINE, RANDOM    Special Requests NONE    Culture NO GROWTH    Report Status 10/17/2015 FINAL   Glucose, capillary     Status: Abnormal   Collection Time: 10/16/15  6:23 AM  Result Value Ref Range   Glucose-Capillary 110 (H) 65 - 99 mg/dL   Comment 1 Notify RN   Occult blood card to lab, stool RN will collect     Status: None   Collection Time: 10/16/15  8:49 AM  Result Value Ref Range   Fecal Occult Bld NEGATIVE NEGATIVE  CBC WITH DIFFERENTIAL     Status: Abnormal   Collection Time: 10/16/15  9:50 AM  Result Value Ref Range   WBC 8.8 4.0 - 10.5 K/uL   RBC 3.22 (L) 4.22 - 5.81 MIL/uL   Hemoglobin 9.5 (L) 13.0 - 17.0 g/dL   HCT 28.8 (L) 39.0 - 52.0 %   MCV 89.4 78.0 - 100.0 fL   MCH 29.5 26.0 - 34.0 pg   MCHC 33.0 30.0 - 36.0 g/dL   RDW 13.8 11.5 - 15.5 %   Platelets 494 (H) 150 - 400 K/uL   Neutrophils Relative % 65 %   Neutro Abs 5.8 1.7 - 7.7 K/uL   Lymphocytes Relative 20 %   Lymphs Abs 1.7 0.7 - 4.0 K/uL   Monocytes Relative 9 %   Monocytes Absolute 0.8 0.1 - 1.0 K/uL   Eosinophils Relative 5 %   Eosinophils Absolute 0.4 0.0 - 0.7 K/uL   Basophils Relative 1 %   Basophils Absolute 0.1 0.0 - 0.1 K/uL  Comprehensive metabolic panel     Status: Abnormal   Collection Time: 10/16/15  9:50 AM  Result Value Ref Range   Sodium 142 135 - 145 mmol/L   Potassium 3.6 3.5 - 5.1 mmol/L   Chloride 107 101 - 111 mmol/L   CO2 28 22 - 32 mmol/L   Glucose, Bld 130 (H) 65 - 99 mg/dL   BUN 16 6 - 20 mg/dL   Creatinine, Ser 1.17 0.61 - 1.24 mg/dL   Calcium 9.3 8.9 - 10.3 mg/dL   Total Protein 5.7 (L) 6.5 - 8.1 g/dL   Albumin 2.7 (L) 3.5 - 5.0 g/dL   AST 33 15 - 41 U/L   ALT 37 17 - 63 U/L   Alkaline  Phosphatase 79 38 - 126 U/L   Total Bilirubin 0.5 0.3 - 1.2 mg/dL   GFR calc non Af Amer >60 >60 mL/min   GFR calc Af Amer >60 >60 mL/min    Comment: (NOTE) The eGFR has been calculated using the CKD EPI equation. This calculation has not been validated in all clinical situations. eGFR's persistently <60 mL/min signify possible Chronic Kidney Disease.    Anion gap 7 5 - 15  Glucose, capillary     Status: Abnormal   Collection Time: 10/16/15 12:03 PM  Result Value Ref Range   Glucose-Capillary 118 (H) 65 - 99 mg/dL  Glucose, capillary     Status: Abnormal   Collection Time: 10/16/15  4:28 PM  Result Value Ref Range   Glucose-Capillary 116 (H) 65 - 99 mg/dL  Glucose, capillary     Status: Abnormal   Collection Time: 10/16/15  8:38 PM  Result Value Ref Range   Glucose-Capillary 129 (H) 65 - 99 mg/dL   Comment 1 Notify RN   Glucose, capillary     Status: Abnormal   Collection Time: 10/17/15  6:34 AM  Result Value Ref Range   Glucose-Capillary 120 (H) 65 - 99 mg/dL   Comment 1 Notify RN   Glucose, capillary     Status: Abnormal   Collection Time: 10/17/15 11:03 AM  Result Value Ref Range   Glucose-Capillary 109 (H)  65 - 99 mg/dL  Glucose, capillary     Status: Abnormal   Collection Time: 10/17/15  3:59 PM  Result Value Ref Range   Glucose-Capillary 108 (H) 65 - 99 mg/dL  Glucose, capillary     Status: Abnormal   Collection Time: 10/17/15  9:40 PM  Result Value Ref Range   Glucose-Capillary 124 (H) 65 - 99 mg/dL  Glucose, capillary     Status: Abnormal   Collection Time: 10/18/15  6:59 AM  Result Value Ref Range   Glucose-Capillary 105 (H) 65 - 99 mg/dL  Glucose, capillary     Status: Abnormal   Collection Time: 10/18/15 12:10 PM  Result Value Ref Range   Glucose-Capillary 118 (H) 65 - 99 mg/dL  Glucose, capillary     Status: Abnormal   Collection Time: 10/18/15  4:29 PM  Result Value Ref Range   Glucose-Capillary 129 (H) 65 - 99 mg/dL  Glucose, capillary     Status:  Abnormal   Collection Time: 10/18/15  8:22 PM  Result Value Ref Range   Glucose-Capillary 129 (H) 65 - 99 mg/dL  Glucose, capillary     Status: Abnormal   Collection Time: 10/19/15  6:34 AM  Result Value Ref Range   Glucose-Capillary 104 (H) 65 - 99 mg/dL  Glucose, capillary     Status: Abnormal   Collection Time: 10/19/15 11:26 AM  Result Value Ref Range   Glucose-Capillary 115 (H) 65 - 99 mg/dL  Glucose, capillary     Status: Abnormal   Collection Time: 10/19/15  4:42 PM  Result Value Ref Range   Glucose-Capillary 135 (H) 65 - 99 mg/dL  Glucose, capillary     Status: Abnormal   Collection Time: 10/19/15  9:38 PM  Result Value Ref Range   Glucose-Capillary 167 (H) 65 - 99 mg/dL  Glucose, capillary     Status: Abnormal   Collection Time: 10/20/15  6:30 AM  Result Value Ref Range   Glucose-Capillary 102 (H) 65 - 99 mg/dL   Comment 1 Notify RN   CBC     Status: Abnormal   Collection Time: 10/20/15  6:42 AM  Result Value Ref Range   WBC 9.1 4.0 - 10.5 K/uL   RBC 3.21 (L) 4.22 - 5.81 MIL/uL   Hemoglobin 9.6 (L) 13.0 - 17.0 g/dL   HCT 50.9 (L) 18.5 - 99.5 %   MCV 89.7 78.0 - 100.0 fL   MCH 29.9 26.0 - 34.0 pg   MCHC 33.3 30.0 - 36.0 g/dL   RDW 66.7 17.7 - 95.6 %   Platelets 481 (H) 150 - 400 K/uL  Glucose, capillary     Status: Abnormal   Collection Time: 10/20/15 11:35 AM  Result Value Ref Range   Glucose-Capillary 116 (H) 65 - 99 mg/dL   Comment 1 Notify RN   Basic metabolic panel     Status: Abnormal   Collection Time: 10/20/15  1:53 PM  Result Value Ref Range   Sodium 141 135 - 145 mmol/L   Potassium 4.0 3.5 - 5.1 mmol/L   Chloride 106 101 - 111 mmol/L   CO2 26 22 - 32 mmol/L   Glucose, Bld 196 (H) 65 - 99 mg/dL   BUN 22 (H) 6 - 20 mg/dL   Creatinine, Ser 4.62 0.61 - 1.24 mg/dL   Calcium 9.2 8.9 - 90.0 mg/dL   GFR calc non Af Amer >60 >60 mL/min   GFR calc Af Amer >60 >60 mL/min    Comment: (NOTE)  The eGFR has been calculated using the CKD EPI equation. This  calculation has not been validated in all clinical situations. eGFR's persistently <60 mL/min signify possible Chronic Kidney Disease.    Anion gap 9 5 - 15  Glucose, capillary     Status: Abnormal   Collection Time: 10/20/15  4:27 PM  Result Value Ref Range   Glucose-Capillary 127 (H) 65 - 99 mg/dL   Comment 1 Notify RN   Glucose, capillary     Status: Abnormal   Collection Time: 10/20/15  8:39 PM  Result Value Ref Range   Glucose-Capillary 142 (H) 65 - 99 mg/dL   Comment 1 Notify RN   Glucose, capillary     Status: None   Collection Time: 10/21/15  6:31 AM  Result Value Ref Range   Glucose-Capillary 96 65 - 99 mg/dL  Glucose, capillary     Status: Abnormal   Collection Time: 10/21/15 11:16 AM  Result Value Ref Range   Glucose-Capillary 126 (H) 65 - 99 mg/dL  Glucose, capillary     Status: Abnormal   Collection Time: 10/21/15  4:22 PM  Result Value Ref Range   Glucose-Capillary 103 (H) 65 - 99 mg/dL   Comment 1 Notify RN   Glucose, capillary     Status: Abnormal   Collection Time: 10/21/15  8:55 PM  Result Value Ref Range   Glucose-Capillary 148 (H) 65 - 99 mg/dL  Glucose, capillary     Status: None   Collection Time: 10/22/15  6:37 AM  Result Value Ref Range   Glucose-Capillary 95 65 - 99 mg/dL  Glucose, capillary     Status: Abnormal   Collection Time: 10/22/15 11:45 AM  Result Value Ref Range   Glucose-Capillary 117 (H) 65 - 99 mg/dL  Glucose, capillary     Status: Abnormal   Collection Time: 10/22/15  4:34 PM  Result Value Ref Range   Glucose-Capillary 108 (H) 65 - 99 mg/dL  Glucose, capillary     Status: Abnormal   Collection Time: 10/22/15  8:34 PM  Result Value Ref Range   Glucose-Capillary 154 (H) 65 - 99 mg/dL   Comment 1 Notify RN   Basic metabolic panel     Status: Abnormal   Collection Time: 10/23/15  5:28 AM  Result Value Ref Range   Sodium 141 135 - 145 mmol/L   Potassium 4.2 3.5 - 5.1 mmol/L   Chloride 106 101 - 111 mmol/L   CO2 27 22 - 32 mmol/L    Glucose, Bld 104 (H) 65 - 99 mg/dL   BUN 20 6 - 20 mg/dL   Creatinine, Ser 0.94 0.61 - 1.24 mg/dL   Calcium 9.4 8.9 - 10.3 mg/dL   GFR calc non Af Amer >60 >60 mL/min   GFR calc Af Amer >60 >60 mL/min    Comment: (NOTE) The eGFR has been calculated using the CKD EPI equation. This calculation has not been validated in all clinical situations. eGFR's persistently <60 mL/min signify possible Chronic Kidney Disease.    Anion gap 8 5 - 15  Glucose, capillary     Status: Abnormal   Collection Time: 10/23/15  6:34 AM  Result Value Ref Range   Glucose-Capillary 104 (H) 65 - 99 mg/dL   Comment 1 Notify RN   Uric acid     Status: Abnormal   Collection Time: 10/23/15 11:29 AM  Result Value Ref Range   Uric Acid, Serum 7.8 (H) 4.4 - 7.6 mg/dL  Glucose, capillary  Status: Abnormal   Collection Time: 10/23/15 11:36 AM  Result Value Ref Range   Glucose-Capillary 132 (H) 65 - 99 mg/dL   Comment 1 Notify RN   COMPLETE METABOLIC PANEL WITH GFR     Status: None   Collection Time: 11/06/15  3:48 PM  Result Value Ref Range   Sodium 139 135 - 146 mmol/L   Potassium 4.8 3.5 - 5.3 mmol/L   Chloride 103 98 - 110 mmol/L   CO2 26 20 - 31 mmol/L   Glucose, Bld 80 65 - 99 mg/dL   BUN 20 7 - 25 mg/dL   Creat 1.08 0.70 - 1.18 mg/dL    Comment:   For patients > or = 70 years of age: The upper reference limit for Creatinine is approximately 13% higher for people identified as African-American.      Total Bilirubin 0.4 0.2 - 1.2 mg/dL   Alkaline Phosphatase 98 40 - 115 U/L   AST 19 10 - 35 U/L   ALT 12 9 - 46 U/L   Total Protein 6.4 6.1 - 8.1 g/dL   Albumin 3.7 3.6 - 5.1 g/dL   Calcium 9.4 8.6 - 10.3 mg/dL   GFR, Est African American 80 >=60 mL/min   GFR, Est Non African American 69 >=60 mL/min  CBC with Differential     Status: Abnormal   Collection Time: 11/06/15  3:48 PM  Result Value Ref Range   WBC 7.3 3.8 - 10.8 K/uL   RBC 3.84 (L) 4.20 - 5.80 MIL/uL   Hemoglobin 11.1 (L) 13.2 -  17.1 g/dL   HCT 34.3 (L) 38.5 - 50.0 %   MCV 89.3 80.0 - 100.0 fL   MCH 28.9 27.0 - 33.0 pg   MCHC 32.4 32.0 - 36.0 g/dL   RDW 14.7 11.0 - 15.0 %   Platelets 189 140 - 400 K/uL   MPV 10.4 7.5 - 12.5 fL   Neutro Abs 4,453 1,500 - 7,800 cells/uL   Lymphs Abs 1,898 850 - 3,900 cells/uL   Monocytes Absolute 584 200 - 950 cells/uL   Eosinophils Absolute 365 15 - 500 cells/uL   Basophils Absolute 0 0 - 200 cells/uL   Neutrophils Relative % 61 %   Lymphocytes Relative 26 %   Monocytes Relative 8 %   Eosinophils Relative 5 %   Basophils Relative 0 %   Smear Review Criteria for review not met   HgB A1c     Status: None   Collection Time: 11/06/15  3:48 PM  Result Value Ref Range   Hgb A1c MFr Bld 5.3 <5.7 %    Comment:   For the purpose of screening for the presence of diabetes:   <5.7%       Consistent with the absence of diabetes 5.7-6.4 %   Consistent with increased risk for diabetes (prediabetes) >=6.5 %     Consistent with diabetes   This assay result is consistent with a decreased risk of diabetes.   Currently, no consensus exists regarding use of hemoglobin A1c for diagnosis of diabetes in children.   According to American Diabetes Association (ADA) guidelines, hemoglobin A1c <7.0% represents optimal control in non-pregnant diabetic patients. Different metrics may apply to specific patient populations. Standards of Medical Care in Diabetes (ADA).      Mean Plasma Glucose 105 mg/dL  HM DIABETES EYE EXAM     Status: None   Collection Time: 11/14/15 12:00 AM  Result Value Ref Range   HM Diabetic Eye  Exam No Retinopathy No Retinopathy     Assessment & Plan  Problem List Items Addressed This Visit      Cardiovascular and Mediastinum   Hypertension - Primary     Endocrine   Diabetes ([place])     Musculoskeletal and Integument   Degenerative disc disease, lumbar     Other   Hyperlipidemia   Pedal edema    Other Visit Diagnoses    Immunization due       Relevant  Orders   Flu vaccine HIGH DOSE PF (Fluzone High dose)      No orders of the defined types were placed in this encounter. 1. Essential hypertension Cont meds  2. Diabetes mellitus due to underlying condition with hyperosmolarity without coma, without long-term current use of insulin (HCC)  Cont meds 3. Pedal edema Cont meds  4. Hyperlipidemia, unspecified hyperlipidemia type Cont meds  5. Degenerative disc disease, lumbar   6. Immunization due  - Flu vaccine HIGH DOSE PF (Fluzone High dose)

## 2016-01-14 ENCOUNTER — Encounter: Payer: Self-pay | Admitting: Physical Therapy

## 2016-01-14 ENCOUNTER — Ambulatory Visit: Payer: PPO | Admitting: Physical Therapy

## 2016-01-14 DIAGNOSIS — R262 Difficulty in walking, not elsewhere classified: Secondary | ICD-10-CM

## 2016-01-14 DIAGNOSIS — M6281 Muscle weakness (generalized): Secondary | ICD-10-CM | POA: Diagnosis not present

## 2016-01-15 NOTE — Therapy (Signed)
[facility] [address] [place], [address] Phone: [phone]   Fax:  [phone]  Physical Therapy Treatment  Patient Details  Name: Danny Patterson MRN: [id number] Date of Birth: Oct 14, 1945 Referring Provider: Karn Cassis MD  Encounter Date: 01/14/2016      PT End of Session - 01/14/16 1116    Visit Number 21   Number of Visits 36   Date for PT Re-Evaluation 02/13/16   Authorization Type 21   Authorization Time Period 30 (G code)   PT Start Time 1031   PT Stop Time 1115   PT Time Calculation (min) 44 min   Activity Tolerance Patient tolerated treatment well;Patient limited by fatigue   Behavior During Therapy [facility] for tasks assessed/performed      Past Medical History:  Diagnosis Date  . Allergy   . Arthritis   . Diabetes mellitus without complication (HCC)   . Gout   . Hyperlipidemia   . Hypertension   . Kidney stone   . Rheumatic fever     Past Surgical History:  Procedure Laterality Date  . BACK SURGERY    . knot     removed from neck  . POSTERIOR LUMBAR FUSION 4 LEVEL N/A 10/01/2015   Procedure: Lumbar three-four,  Lumbar four-five,  Lumbar five-Sacrum one posterior lumbar interbody fusion with Laminotomy at Lumbar Two-Three;  Surgeon: Hilda Lias, MD;  Location: [facility];  Service: Neurosurgery;  Laterality: N/A;    There were no vitals filed for this visit.      Subjective Assessment - 01/14/16 1119    Subjective Patient reports he is improving slowly and still has pain in lower back with prolonged activity with standing, exercise or walking.   Limitations Lifting;Standing;Walking;House hold activities;Other (comment)   Patient Stated Goals get back to full function for work and be able to walk without AD   Currently in Pain? No/denies     Objective: Gait; more erect posture with improved weight shift to right as compared to previous session, shoulder height right  slightly more elevated as compared to left Strength: right LE decreased ankle DF 3-/5, left hip extension, abduction decreased as compared to right LE  Treatment:  Manual therapy; with patient seated on treatment table: STM (superficial and deep techniques), along right side thoracic to lumbar spine to decrease spasms and improve soft tissue elasticity  Therapeutic exercise: patient performed exercises with verbal, tactile cues  demonstration of therapist: Sitting: Hip adduction with glute sets x 10 Hip abduction with manual resistance x 15 reps moderate resistance given end range hole 5 seconds Knee extension with    3# x 15 reps each LE  Knee flexion with blue resistive band x 25 reps Sit to stand from elevated surface (~25") with blue resistive band around thighs x 10 reps with cuing  Standing:   Resistive band Blue, scapular rows 2 x 15  Straight arm pull downs blue resistive band 2 x 15 single arm row with doubled blue resistive band x 15 reps each UE Standing: At wall posture correction with cervical spine retraction and scapular retraction x 10 (pillow behind head for support for head to reach wall) Side stepping along airex balance beam x 2 min Walk along balance stones x 2 min. Diagonal wedding march without weight x 1 min. With close supervision, non CG for safety   Patient response to treatment: Patient demonstrated improved technique with exercises with minimal VC for correct  alignment and close supervision, non CG for safety. Patient reported improved confidence and balance with side stepping on balance beam today, improved motor control with sit to stand and diagonal wedding march with repetition and repeated cuing. Patient with mild fatigue with exercises and reported feeling some increased soreness, weakness in lower back at end of session.        PT Education - 01/14/16 1112    Education provided Yes   Education Details HEP: walking activities to encourage  endurance/strength: forward, backward, side stepping and diagonal "wedding march"   Person(s) Educated Patient   Methods Explanation;Demonstration;Verbal cues   Comprehension Verbalized understanding;Returned demonstration;Verbal cues required             PT Long Term Goals - 01/12/16 1548      PT LONG TERM GOAL #1   Title Patient will be able to walk safely, erect posture with appropriate AD on level surfaces >600' and stairs by 01/16/2016    Baseline 01/12/16 walking without AD, unable to perform 6 mi. walk due to right hip pain with any walking    Status Deferred     PT LONG TERM GOAL #2   Title Patient will demonstrate improved function for daily activities with mild pain as indicated by modified oswestry score of 30% or less by 02/13/2016   Baseline MODI 62% (severe self perceived disability); MODI 12/05/15 50%; 01/12/2016 = 48%   Status On-going     PT LONG TERM GOAL #3   Title Patient will demonstrate functional community ambulation and low fall rist by 10MW of 10 seconds or less by 02/13/2016   Baseline 12/09/15 11.5 seconds; 01/12/2016 deferred   Status On-going     PT LONG TERM GOAL #4   Title Patient will be independent with home program for exercises and self management by 02/13/2016 to continue with progressive healing once discharged from physical therapy    Baseline 01/12/16 limited knowledge of appropriate exercises and progression and requires assistance for performing exercises and progressing intensity appropriately   Status On-going               Plan - 01/14/16 1116    Clinical Impression Statement Patient progressing well towards goals and is limited by decreased endurance and strength in back, core and LE's s/p spinal fusion multi level with residual weakness in both LE's. He will continue to benefit from physical therapy intervention in order to return to maximal function with daily tasks.   Rehab Potential Good   PT Frequency 3x / week   PT  Duration 8 weeks   PT Treatment/Interventions Patient/family education;Gait training;Cryotherapy;Therapeutic activities;Therapeutic exercise;Neuromuscular re-education;Scar mobilization;Manual techniques;Moist Heat   PT Next Visit Plan progress exercises, core control, gait training, weight shifting   PT Home Exercise Plan core control, LE exercises, balance/walking activities      Patient will benefit from skilled therapeutic intervention in order to improve the following deficits and impairments:  Decreased strength, Decreased activity tolerance, Decreased knowledge of precautions, Pain, Decreased endurance, Decreased range of motion, Difficulty walking, Increased muscle spasms  Visit Diagnosis: Muscle weakness (generalized)  Difficulty in walking, not elsewhere classified     Problem List Patient Active Problem List   Diagnosis Date Noted  . Cellulitis 11/27/2015  . Pedal edema 11/27/2015  . Adjustment disorder with depressed mood   . Osteoarthritis of spine with radiculopathy, lumbar region 10/15/2015  . Abnormal MRI   . Slurred speech   . Surgery, elective   . Loose stools   .  OSA (obstructive sleep apnea)   . Acute blood loss anemia   . Pneumonia   . Urinary retention   . Lewy body dementia   . Encephalopathy   . Altered mental status   . Acute encephalopathy 10/12/2015  . Hypokalemia 10/12/2015  . Anemia 10/12/2015  . S/P lumbar fusion 10/12/2015  . Acute respiratory failure (HCC)   . Degenerative disc disease, lumbar 10/01/2015  . Sciatica of right side associated with disorder of lumbosacral spine 07/21/2015  . Hypertension 02/18/2015  . Diabetes (HCC) 02/18/2015  . Gout 02/18/2015  . Hyperlipidemia 02/18/2015  . Arthritis 02/18/2015  . Encounter to establish care 02/18/2015    Beacher May PT 01/15/2016, 10:08 AM  [facility] AND SPORTS MEDICINE [address] [place], [address] Phone: [phone]    Fax:  [phone]  Name: Danny Patterson MRN: [id number] Date of Birth: 10-03-1945

## 2016-01-16 ENCOUNTER — Encounter: Payer: Self-pay | Admitting: Physical Therapy

## 2016-01-16 ENCOUNTER — Ambulatory Visit: Payer: PPO | Admitting: Physical Therapy

## 2016-01-16 DIAGNOSIS — R262 Difficulty in walking, not elsewhere classified: Secondary | ICD-10-CM

## 2016-01-16 DIAGNOSIS — M6281 Muscle weakness (generalized): Secondary | ICD-10-CM | POA: Diagnosis not present

## 2016-01-16 NOTE — Therapy (Signed)
[place] [facility] [address], [place], [address] Phone: [phone]   Fax:  [phone]  Physical Therapy Treatment  Patient Details  Name: Danny Patterson MRN: [id number] Date of Birth: 1945-04-26 Referring Provider: Karn Cassis MD  Encounter Date: 01/16/2016      PT End of Session - 01/16/16 0929    Visit Number 22   Number of Visits 36   Date for PT Re-Evaluation 02/13/16   Authorization Type 22   Authorization Time Period 30 (G code)   PT Start Time 0925   PT Stop Time 0958   PT Time Calculation (min) 33 min   Activity Tolerance Patient tolerated treatment well;Patient limited by fatigue   Behavior During Therapy [facility] for tasks assessed/performed      Past Medical History:  Diagnosis Date  . Allergy   . Arthritis   . Diabetes mellitus without complication (HCC)   . Gout   . Hyperlipidemia   . Hypertension   . Kidney stone   . Rheumatic fever     Past Surgical History:  Procedure Laterality Date  . BACK SURGERY    . knot     removed from neck  . POSTERIOR LUMBAR FUSION 4 LEVEL N/A 10/01/2015   Procedure: Lumbar three-four,  Lumbar four-five,  Lumbar five-Sacrum one posterior lumbar interbody fusion with Laminotomy at Lumbar Two-Three;  Surgeon: Hilda Lias, MD;  Location: [facility];  Service: Neurosurgery;  Laterality: N/A;    There were no vitals filed for this visit.      Subjective Assessment - 01/16/16 0928    Subjective improving right hip pain, stiffness with getting up out of chair and first thing in the morning. Reports continued weakness in legs and back and right foot   Limitations Lifting;Standing;Walking;House hold activities;Other (comment)   Patient Stated Goals get back to full function for work and be able to walk without AD   Currently in Pain? No/denies      Objective: Gait; limp on right with trunk lean to left  Strength: right LE decreased ankle DF 3-/5,  left hip extension, abduction decreased as compared to right LE  Treatment:  Manual therapy; with patient seated on treatment table: STM (superficial and deep techniques), along right side thoracic to lumbar spine to decrease spasms and improve soft tissue elasticity  Therapeutic exercise: patient performed exercises with verbal, tactile cues  demonstration of therapist: Sitting: Hip adduction with ball and glute sets x 15 Hip abduction with manual resistance x 15 reps moderate resistance given end range hole 5 seconds Knee extension with 3# 2 x 15 reps each LE  Knee flexion with blue resistive band 2 x 25 reps Ankle DF 5 reps with assistance right, 10 reps with moderate manual resistance left ankle followed by acitve Df x 10 right and left through available ROM Standing:   Resistive band Blue, scapular rows 1 x 25  Straight arm pull downs (sitting) blue resistive band 1 x 25 single arm row with doubled blue resistive band x 20 reps each UE Standing: Side stepping along airex balance beam x 3 min  Patient response to treatment: Patient demonstrated improved technique and motor control with exercises with minimal VC for correct alignment. Patient demonstrated improved endurance with walking on balance beam airex x 3 min. Patient with mild fatigue in lower back and LE's at end of session.        PT Education - 01/16/16 6962  Education provided Yes   Education Details HEP: walking side stepping and forward, backward at home at counter for safety    Person(s) Educated Patient   Methods Explanation;Demonstration;Verbal cues   Comprehension Verbalized understanding;Returned demonstration;Verbal cues required             PT Long Term Goals - 01/12/16 1548      PT LONG TERM GOAL #1   Title Patient will be able to walk safely, erect posture with appropriate AD on level surfaces >600' and stairs by 01/16/2016    Baseline 01/12/16 walking without AD, unable to perform 6 mi. walk  due to right hip pain with any walking    Status Deferred     PT LONG TERM GOAL #2   Title Patient will demonstrate improved function for daily activities with mild pain as indicated by modified oswestry score of 30% or less by 02/13/2016   Baseline MODI 62% (severe self perceived disability); MODI 12/05/15 50%; 01/12/2016 = 48%   Status On-going     PT LONG TERM GOAL #3   Title Patient will demonstrate functional community ambulation and low fall rist by of 10 seconds or less by 02/13/2016   Baseline 12/09/15 11.5 seconds; 01/12/2016 deferred   Status On-going     PT LONG TERM GOAL #4   Title Patient will be independent with home program for exercises and self management by 02/13/2016 to continue with progressive healing once discharged from physical therapy    Baseline 01/12/16 limited knowledge of appropriate exercises and progression and requires assistance for performing exercises and progressing intensity appropriately   Status On-going               Plan - 01/16/16 1117    Clinical Impression Statement Patient continues to improve slowly with increasing strenth and endurance s/p spinal surgery with multiple fusions with residual weakness in both LE's. He is responding well to current therapy intervention and will benefit from additional physical therapy in order to address limitations in strength/endurance.    Rehab Potential Good   PT Frequency 3x / week   PT Duration 8 weeks   PT Treatment/Interventions Patient/family education;Gait training;Cryotherapy;Therapeutic activities;Therapeutic exercise;Neuromuscular re-education;Scar mobilization;Manual techniques;Moist Heat   PT Next Visit Plan progress exercises, core control, gait training, weight shifting   PT Home Exercise Plan core control, LE exercises, balance/walking activities      Patient will benefit from skilled therapeutic intervention in order to improve the following deficits and impairments:  Decreased  strength, Decreased activity tolerance, Decreased knowledge of precautions, Pain, Decreased endurance, Decreased range of motion, Difficulty walking, Increased muscle spasms  Visit Diagnosis: Muscle weakness (generalized)  Difficulty in walking, not elsewhere classified     Problem List Patient Active Problem List   Diagnosis Date Noted  . Cellulitis 11/27/2015  . Pedal edema 11/27/2015  . Adjustment disorder with depressed mood   . Osteoarthritis of spine with radiculopathy, lumbar region 10/15/2015  . Abnormal MRI   . Slurred speech   . Surgery, elective   . Loose stools   . OSA (obstructive sleep apnea)   . Acute blood loss anemia   . Pneumonia   . Urinary retention   . Lewy body dementia   . Encephalopathy   . Altered mental status   . Acute encephalopathy 10/12/2015  . Hypokalemia 10/12/2015  . Anemia 10/12/2015  . S/P lumbar fusion 10/12/2015  . Acute respiratory failure (HCC)   . Degenerative disc disease, lumbar 10/01/2015  . Sciatica of right  side associated with disorder of lumbosacral spine 07/21/2015  . Hypertension 02/18/2015  . Diabetes (HCC) 02/18/2015  . Gout 02/18/2015  . Hyperlipidemia 02/18/2015  . Arthritis 02/18/2015  . Encounter to establish care 02/18/2015    Beacher MayBrooks, Eugena Rhue PT 01/16/2016, 11:20 AM  [place] [facility] [address] [place], [name], [address] Phone: [phone]   Fax:  [phone]  Name: Danny Patterson MRN: [id number] Date of Birth: Nov 01, 1945

## 2016-01-19 ENCOUNTER — Ambulatory Visit: Payer: PPO | Admitting: Physical Therapy

## 2016-01-19 ENCOUNTER — Encounter: Payer: Self-pay | Admitting: Physical Therapy

## 2016-01-19 DIAGNOSIS — R262 Difficulty in walking, not elsewhere classified: Secondary | ICD-10-CM

## 2016-01-19 DIAGNOSIS — M6281 Muscle weakness (generalized): Secondary | ICD-10-CM | POA: Diagnosis not present

## 2016-01-19 NOTE — Therapy (Signed)
[place] [facility] [address] [place], [name], [address] Phone: [phone]   Fax:  [phone]  Physical Therapy Treatment  Patient Details  Name: Danny StacksGary L Raglin MRN: [id number] Date of Birth: 06-26-1945 Referring Provider: Karn CassisBotero, Ernesto M. MD  Encounter Date: 01/19/2016      PT End of Session - 01/19/16 1043    Visit Number 23   Number of Visits 36   Date for PT Re-Evaluation 02/13/16   Authorization Type 23   Authorization Time Period 30 (G code)   PT Start Time 1041   PT Stop Time 1113   PT Time Calculation (min) 32 min   Activity Tolerance Patient tolerated treatment well;Patient limited by fatigue   Behavior [facility] for tasks assessed/performed      Past Medical History:  Diagnosis Date  . Allergy   . Arthritis   . Diabetes mellitus without complication (HCC)   . Gout   . Hyperlipidemia   . Hypertension   . Kidney stone   . Rheumatic fever     Past Surgical History:  Procedure Laterality Date  . BACK SURGERY    . knot     removed from neck  . POSTERIOR LUMBAR FUSION 4 LEVEL N/A 10/01/2015   Procedure: Lumbar three-four,  Lumbar four-five,  Lumbar five-Sacrum one posterior lumbar interbody fusion with Laminotomy at Lumbar Two-Three;  Surgeon: Hilda LiasErnesto Botero, MD;  Location: [facility];  Service: Neurosurgery;  Laterality: N/A;    There were no vitals filed for this visit.      Subjective Assessment - 01/19/16 1042    Subjective Improving with right hip pain and stiffness with getting up and out of chair.    Limitations Lifting;Standing;Walking;House hold activities;Other (comment)   Patient Stated Goals get back to full function for work and be able to walk without AD   Currently in Pain? No/denies        Objective: Gait; improved posture with more erect trunk and improved weight shift to right/left Strength: right LE left hip extension, abduction decreased as compared to  right LE  Treatment: Manual therapy; with patient seated on treatment table: STM (superficial and deep techniques), along right side thoracic to lumbar spine to decrease spasms and improve soft tissue elasticity  Therapeutic exercise: patient performed exercises with verbal, tactile cues demonstration of therapist: Sitting: Hip adduction with ball and glute sets x 15 Hip abduction with manual resistance x 15 reps moderate resistance given end range hole 5 seconds Knee extension with4# 2 x 15 reps each LE Knee flexion with blueresistive band 2 x 25 reps Standing:  Resistive band Blue,scapular rows 1 x 25  Straight arm pull downs (sitting) blue resistive band 1 x 25 single arm row with doubled blue resistive band x 20 reps each UE with staggered stance and one foot on balance stone Standing: Side stepping along airex balance beam x 2 min Walk along balance stones x 2 min.  Patient response to treatment: Patient demonstrates improved confidence and endurance with all standing exercises. He required minimal VC and assistance for correct posture and technique. No increased fatigue in lower back reported with standing exercises today  Patient education:  HEP: continue to work on balance with forward/backward walking and close eyes in standing while at counter for safety Demonstrated, verbal cues given Patient returned demonstration with verbal cues      PT Long Term Goals - 01/12/16 1548      PT LONG  TERM GOAL #1   Title Patient will be able to walk safely, erect posture with appropriate AD on level surfaces >600' and stairs by 01/16/2016    Baseline 01/12/16 walking without AD, unable to perform 6 mi. walk due to right hip pain with any walking    Status Deferred     PT LONG TERM GOAL #2   Title Patient will demonstrate improved function for daily activities with mild pain as indicated by modified oswestry score of 30% or less by 02/13/2016   Baseline MODI 62% (severe self  perceived disability); MODI 12/05/15 50%; 01/12/2016 = 48%   Status On-going     PT LONG TERM GOAL #3   Title Patient will demonstrate functional community ambulation and low fall rist by 10MW of 10 seconds or less by 02/13/2016   Baseline 12/09/15 11.5 seconds; 01/12/2016 deferred   Status On-going     PT LONG TERM GOAL #4   Title Patient will be independent with home program for exercises and self management by 02/13/2016 to continue with progressive healing once discharged from physical therapy    Baseline 01/12/16 limited knowledge of appropriate exercises and progression and requires assistance for performing exercises and progressing intensity appropriately   Status On-going               Plan - 01/19/16 1204    Clinical Impression Statement Improving endurance as demonstrated with decreased rest periods between exercises and decreased lower back fatigue with standing, walking exercises. Improved erect posture with standing and sitting with decreased right shoulder hiking. He continues with weakness in both LE's and requires guidance for exercises and will therefore benefit from additional physical therapy intervention.    PT Frequency 3x / week   PT Duration 8 weeks   PT Treatment/Interventions Patient/family education;Gait training;Cryotherapy;Therapeutic activities;Therapeutic exercise;Neuromuscular re-education;Scar mobilization;Manual techniques;Moist Heat   PT Next Visit Plan progress exercises, core control, gait training, weight shifting   PT Home Exercise Plan core control, LE exercises, balance/walking activities      Patient will benefit from skilled therapeutic intervention in order to improve the following deficits and impairments:  Decreased strength, Decreased activity tolerance, Decreased knowledge of precautions, Pain, Decreased endurance, Decreased range of motion, Difficulty walking, Increased muscle spasms  Visit Diagnosis: Muscle weakness  (generalized)  Difficulty in walking, not elsewhere classified     Problem List Patient Active Problem List   Diagnosis Date Noted  . Cellulitis 11/27/2015  . Pedal edema 11/27/2015  . Adjustment disorder with depressed mood   . Osteoarthritis of spine with radiculopathy, lumbar region 10/15/2015  . Abnormal MRI   . Slurred speech   . Surgery, elective   . Loose stools   . OSA (obstructive sleep apnea)   . Acute blood loss anemia   . Pneumonia   . Urinary retention   . Lewy body dementia   . Encephalopathy   . Altered mental status   . Acute encephalopathy 10/12/2015  . Hypokalemia 10/12/2015  . Anemia 10/12/2015  . S/P lumbar fusion 10/12/2015  . Acute respiratory failure (HCC)   . Degenerative disc disease, lumbar 10/01/2015  . Sciatica of right side associated with disorder of lumbosacral spine 07/21/2015  . Hypertension 02/18/2015  . Diabetes (HCC) 02/18/2015  . Gout 02/18/2015  . Hyperlipidemia 02/18/2015  . Arthritis 02/18/2015  . Encounter to establish care 02/18/2015    Beacher MayBrooks, Marie PT 01/19/2016, 7:58 PM  [facility] [address] [place], [name],  [address] Phone: [phone]   Fax:  [phone]  Name: EUCLID CASSETTA MRN: [id number] Date of Birth: 22-Oct-1945

## 2016-01-21 ENCOUNTER — Ambulatory Visit: Payer: PPO | Attending: Neurosurgery | Admitting: Physical Therapy

## 2016-01-21 ENCOUNTER — Encounter: Payer: Self-pay | Admitting: Physical Therapy

## 2016-01-21 DIAGNOSIS — M6281 Muscle weakness (generalized): Secondary | ICD-10-CM | POA: Diagnosis not present

## 2016-01-21 DIAGNOSIS — R262 Difficulty in walking, not elsewhere classified: Secondary | ICD-10-CM | POA: Insufficient documentation

## 2016-01-21 NOTE — Therapy (Signed)
[place] [facility] PH[facility] AND [facility]ORTS MEDICINE [address] [place], [name], [address] Phone: [phone]   Fax:  [phone]  Physical Therapy Treatment  Patient Details  Name: Danny Patterson MRN: [id number] Date of Birth: 1945-06-20 Referring Provider: Karn CassisBotero, Ernesto M. MD  Encounter Date: 01/21/2016      PT End of Session - 01/21/16 1030    Visit Number 24   Number of Visits 36   Date for PT Re-Evaluation 02/13/16   Authorization Type 24   Authorization Time Period 30 (G code)   PT Start Time 1023   PT Stop Time 1105   PT Time Calculation (min) 42 min   Activity Tolerance Patient tolerated treatment well;Patient limited by fatigue   Behavior During Therapy [facility] for tasks assessed/performed      Past Medical History:  Diagnosis Date  . Allergy   . Arthritis   . Diabetes mellitus without complication (HCC)   . Gout   . Hyperlipidemia   . Hypertension   . Kidney stone   . Rheumatic fever     Past Surgical History:  Procedure Laterality Date  . BACK SURGERY    . knot     removed from neck  . POSTERIOR LUMBAR FUSION 4 LEVEL N/A 10/01/2015   Procedure: Lumbar three-four,  Lumbar four-five,  Lumbar five-Sacrum one posterior lumbar interbody fusion with Laminotomy at Lumbar Two-Three;  Surgeon: Hilda LiasErnesto Botero, MD;  Location: [facility];  Service: Neurosurgery;  Laterality: N/A;    There were no vitals filed for this visit.      Subjective Assessment - 01/21/16 1028    Subjective Reports right hip has improved with less pain and left knee still feels swollen.   Limitations Lifting;Standing;Walking;House hold activities;Other (comment)   Patient Stated Goals get back to full function for work and be able to walk without AD   Currently in Pain? No/denies      Objective: Gait; ambulating with mild antalgic gait pattern with steppage gait right LE, mild foot drop;  Mild forward flexion at hips  Treatment: Therapeutic exercise:  patient performed exercises with verbal, tactile cues demonstration of therapist: Sitting: Hip adduction with ball andglute sets x 15 Hip abduction with manual resistance x 15 reps moderate resistance given end range hole 5 seconds Knee extension with4# 2x 15 reps each LE Knee flexion with blueresistive band 2 x 25 reps Sit to stand from elevated surface with blue band around thighs x 10 reps Standing:  Resistive band Blue with black bands,scapular rows 1x 25 Straight arm pull downs (sitting)blue + black resistive band 1x 25 single arm row with doubled blue resistive band x 20reps each UE with staggered stance and one foot on balance stone Standing: Side stepping along airex balance beam x 2 min Walk along balance stones x 2 min. Standing balance with staggered stance on (2) balance stones x 30 seconds with one hand on counter for balance/support and eyes closed Walk forward and backward along counter, one hand for support/balance and non CG for safety x 5 sets  Patient response to treatment: Patient demonstrated improved technique and ability to perform balance exercises with moderate VC and supervision for safety. Patient without increased lower back pain throughout entire session.  Improved motor control with repetition and cuing      PT Education - 01/21/16 1029    Education provided Yes   Education Details HEP: work on balance; sit to stand. walking forward and backwards along counter for safety  Person(s) Educated Patient   Methods Explanation;Demonstration;Verbal cues   Comprehension Verbalized understanding;Returned demonstration             PT Long Term Goals - 01/12/16 1548      PT LONG TERM GOAL #1   Title Patient will be able to walk safely, erect posture with appropriate AD on level surfaces >600' and stairs by 01/16/2016    Baseline 01/12/16 walking without AD, unable to perform 6 mi. walk due to right hip pain with any walking    Status Deferred      PT LONG TERM GOAL #2   Title Patient will demonstrate improved function for daily activities with mild pain as indicated by modified oswestry score of 30% or less by 02/13/2016   Baseline MODI 62% (severe self perceived disability); MODI 12/05/15 50%; 01/12/2016 = 48%   Status On-going     PT LONG TERM GOAL #3   Title Patient will demonstrate functional community ambulation and low fall rist by 10MW of 10 seconds or less by 02/13/2016   Baseline 12/09/15 11.5 seconds; 01/12/2016 deferred   Status On-going     PT LONG TERM GOAL #4   Title Patient will be independent with home program for exercises and self management by 02/13/2016 to continue with progressive healing once discharged from physical therapy    Baseline 01/12/16 limited knowledge of appropriate exercises and progression and requires assistance for performing exercises and progressing intensity appropriately   Status On-going               Plan - 01/21/16 1110    Clinical Impression Statement Patient is progressing well with improving strength and balance as demonstrated with all exercises. He continues with weakness in core and LE's with decreased endurance and requires assistance, cuing and supervision for safety with all exercises and will therefore benefit from continues physical therapy intervention.    Rehab Potential Good   PT Frequency 3x / week   PT Duration 8 weeks   PT Treatment/Interventions Patient/family education;Gait training;Cryotherapy;Therapeutic activities;Therapeutic exercise;Neuromuscular re-education;Scar mobilization;Manual techniques;Moist Heat   PT Next Visit Plan progress exercises, core control, gait training, weight shifting   PT Home Exercise Plan core control, LE exercises, balance/walking activities      Patient will benefit from skilled therapeutic intervention in order to improve the following deficits and impairments:  Decreased strength, Decreased activity tolerance, Decreased  knowledge of precautions, Pain, Decreased endurance, Decreased range of motion, Difficulty walking, Increased muscle spasms  Visit Diagnosis: Muscle weakness (generalized)  Difficulty in walking, not elsewhere classified     Problem List Patient Active Problem List   Diagnosis Date Noted  . Cellulitis 11/27/2015  . Pedal edema 11/27/2015  . Adjustment disorder with depressed mood   . Osteoarthritis of spine with radiculopathy, lumbar region 10/15/2015  . Abnormal MRI   . Slurred speech   . Surgery, elective   . Loose stools   . OSA (obstructive sleep apnea)   . Acute blood loss anemia   . Pneumonia   . Urinary retention   . Lewy body dementia   . Encephalopathy   . Altered mental status   . Acute encephalopathy 10/12/2015  . Hypokalemia 10/12/2015  . Anemia 10/12/2015  . S/P lumbar fusion 10/12/2015  . Acute respiratory failure (HCC)   . Degenerative disc disease, lumbar 10/01/2015  . Sciatica of right side associated with disorder of lumbosacral spine 07/21/2015  . Hypertension 02/18/2015  . Diabetes (HCC) 02/18/2015  . Gout 02/18/2015  . Hyperlipidemia  02/18/2015  . Arthritis 02/18/2015  . Encounter to establish care 02/18/2015    Beacher May PT 01/21/2016, 10:59 PM  [facility] PHYSICAL AND SPORTS MEDICINE [address], [place], [address] Phone: [phone]   Fax:  [phone]  Name: Danny Patterson MRN: [id number] Date of Birth: 04/10/1945

## 2016-01-22 ENCOUNTER — Ambulatory Visit: Payer: PPO | Admitting: Physical Therapy

## 2016-01-22 ENCOUNTER — Encounter: Payer: Self-pay | Admitting: Physical Therapy

## 2016-01-22 DIAGNOSIS — R262 Difficulty in walking, not elsewhere classified: Secondary | ICD-10-CM

## 2016-01-22 DIAGNOSIS — M6281 Muscle weakness (generalized): Secondary | ICD-10-CM | POA: Diagnosis not present

## 2016-01-23 ENCOUNTER — Ambulatory Visit: Payer: PPO | Admitting: Physical Therapy

## 2016-01-23 NOTE — Therapy (Signed)
[facility]IC[facility] [address] [place], [address] Phone: [phone]   Fax:  [phone]  Physical Therapy Treatment  Patient Details  Name: Danny Patterson MRN: [id number] Date of Birth: 1945-04-12 Referring Provider: Karn Cassis MD  Encounter Date: 01/22/2016      PT End of Session - 01/22/16 1114    Visit Number 25   Number of Visits 36   Date for PT Re-Evaluation 02/13/16   Authorization Type 25   Authorization Time Period 30 (G code)   PT Start Time 1040   PT Stop Time 1112   PT Time Calculation (min) 32 min   Activity Tolerance Patient tolerated treatment well;Patient limited by fatigue   Behavior During Therapy [facility] for tasks assessed/performed      Past Medical History:  Diagnosis Date  . Allergy   . Arthritis   . Diabetes mellitus without complication (HCC)   . Gout   . Hyperlipidemia   . Hypertension   . Kidney stone   . Rheumatic fever     Past Surgical History:  Procedure Laterality Date  . BACK SURGERY    . knot     removed from neck  . POSTERIOR LUMBAR FUSION 4 LEVEL N/A 10/01/2015   Procedure: Lumbar three-four,  Lumbar four-five,  Lumbar five-Sacrum one posterior lumbar interbody fusion with Laminotomy at Lumbar Two-Three;  Surgeon: Hilda Lias, MD;  Location: [facility];  Service: Neurosurgery;  Laterality: N/A;    There were no vitals filed for this visit.      Subjective Assessment - 01/22/16 1100    Subjective Patient reports he is doing well today and back is less tired feeling with more activity.   Limitations Lifting;Standing;Walking;House hold activities;Other (comment)   Patient Stated Goals get back to full function for work and be able to walk without AD   Currently in Pain? No/denies      Objective: Treatment: Therapeutic exercise: patient performed exercises with verbal, tactile cues demonstration of therapist: Sitting: Hip adduction with ball  andglute sets x 15 Hip abduction with manual resistance x 15 reps moderate resistance given end range hole 5 seconds Knee extension with4# 1x 15 reps each LE Knee flexion with blueresistive band 1 x 25 reps Sit to stand from elevated surface with blue band around thighs x 10 reps Standing:   Straight arm pull downs (sitting)blue + black resistive band 1x 25    At [facility]: single arm row with 5# each UE with staggered stance x 15 reps each Double arm scapular retraction x 15 reps with 10# Side stepping along airex balance beam x Lateral step up with placing opposite foot in front of the other on balance beam x 10 reps each LE Standing balance sotnes with staggered stance on (2) balance stones x 30 seconds with one hand used for balance/support and eyes closed Walk forward and backward along treadmill for support, one hand for support/balance and non CG for safety x 5 sets  Patient response to treatment: Patient demonstrated improved technique/balance on uneven surface with exercises with minimal VC for correct alignment. mild fatigue noted with exercises with back tiring after walking activities.      PT Education - 01/22/16 1115    Education provided Yes   Education Details HEP: forward/backwards walking and balance with static standing   Person(s) Educated Patient   Methods Explanation;Demonstration;Verbal cues   Comprehension Verbalized understanding;Returned demonstration;Verbal cues required  PT Long Term Goals - 01/12/16 1548      PT LONG TERM GOAL #1   Title Patient will be able to walk safely, erect posture with appropriate AD on level surfaces >600' and stairs by 01/16/2016    Baseline 01/12/16 walking without AD, unable to perform 6 mi. walk due to right hip pain with any walking    Status Deferred     PT LONG TERM GOAL #2   Title Patient will demonstrate improved function for daily activities with mild pain as indicated by modified oswestry  score of 30% or less by 02/13/2016   Baseline MODI 62% (severe self perceived disability); MODI 12/05/15 50%; 01/12/2016 = 48%   Status On-going     PT LONG TERM GOAL #3   Title Patient will demonstrate functional community ambulation and low fall rist by 10MW of 10 seconds or less by 02/13/2016   Baseline 12/09/15 11.5 seconds; 01/12/2016 deferred   Status On-going     PT LONG TERM GOAL #4   Title Patient will be independent with home program for exercises and self management by 02/13/2016 to continue with progressive healing once discharged from physical therapy    Baseline 01/12/16 limited knowledge of appropriate exercises and progression and requires assistance for performing exercises and progressing intensity appropriately   Status On-going               Plan - 01/22/16 1115    Clinical Impression Statement Patient demonstrates improving endurance and balance as indicated with improvement with exercises with more challenging activities such as closing eyes and standing on uneven surfaces with less difficulty. He continues with decresaed strength in core, both LE's and is benefitting from physical therapy interventiotn with guidance and progressive exercises.    Rehab Potential Good   PT Frequency 3x / week   PT Duration 8 weeks   PT Treatment/Interventions Patient/family education;Gait training;Cryotherapy;Therapeutic activities;Therapeutic exercise;Neuromuscular re-education;Scar mobilization;Manual techniques;Moist Heat   PT Next Visit Plan progress exercises, core control, gait training, weight shifting   PT Home Exercise Plan core control, LE exercises, balance/walking activities      Patient will benefit from skilled therapeutic intervention in order to improve the following deficits and impairments:  Decreased strength, Decreased activity tolerance, Decreased knowledge of precautions, Pain, Decreased endurance, Decreased range of motion, Difficulty walking, Increased  muscle spasms  Visit Diagnosis: Muscle weakness (generalized)  Difficulty in walking, not elsewhere classified     Problem List Patient Active Problem List   Diagnosis Date Noted  . Cellulitis 11/27/2015  . Pedal edema 11/27/2015  . Adjustment disorder with depressed mood   . Osteoarthritis of spine with radiculopathy, lumbar region 10/15/2015  . Abnormal MRI   . Slurred speech   . Surgery, elective   . Loose stools   . OSA (obstructive sleep apnea)   . Acute blood loss anemia   . Pneumonia   . Urinary retention   . Lewy body dementia   . Encephalopathy   . Altered mental status   . Acute encephalopathy 10/12/2015  . Hypokalemia 10/12/2015  . Anemia 10/12/2015  . S/P lumbar fusion 10/12/2015  . Acute respiratory failure (HCC)   . Degenerative disc disease, lumbar 10/01/2015  . Sciatica of right side associated with disorder of lumbosacral spine 07/21/2015  . Hypertension 02/18/2015  . Diabetes (HCC) 02/18/2015  . Gout 02/18/2015  . Hyperlipidemia 02/18/2015  . Arthritis 02/18/2015  . Encounter to establish care 02/18/2015    Beacher MayBrooks, Jasin Brazel PT 01/23/2016, 5:01 PM  [place] [facility] PHYSICAL [facility] SP[facility]TS MEDICINE [address] [place], [name], [address] Phone: [phone]   Fax:  [phone]  Name: Danny Patterson MRN: [id number] Date of Birth: September 04, 1945

## 2016-01-24 ENCOUNTER — Other Ambulatory Visit: Payer: Self-pay | Admitting: Family Medicine

## 2016-01-26 ENCOUNTER — Ambulatory Visit: Payer: PPO | Admitting: Physical Therapy

## 2016-01-26 ENCOUNTER — Encounter: Payer: Self-pay | Admitting: Physical Therapy

## 2016-01-26 DIAGNOSIS — R262 Difficulty in walking, not elsewhere classified: Secondary | ICD-10-CM

## 2016-01-26 DIAGNOSIS — M6281 Muscle weakness (generalized): Secondary | ICD-10-CM | POA: Diagnosis not present

## 2016-01-26 NOTE — Therapy (Signed)
[place] [facility] PH[facility] MEDICINE [address] [place], [name], [address] Phone: [phone]   Fax:  [phone]  Physical Therapy Treatment  Patient Details  Name: Danny StacksGary L Cena MRN: [id number] Date of Birth: Nov 05, 1945 Referring Provider: Karn CassisBotero, Ernesto M. MD  Encounter Date: 01/26/2016      PT End of Session - 01/26/16 1854    Visit Number 26   Number of Visits 36   Date for PT Re-Evaluation 02/13/16   Authorization Type 26   Authorization Time Period 30 (G code)   PT Start Time 1038   PT Stop Time 1113   PT Time Calculation (min) 35 min   Activity Tolerance Patient tolerated treatment well;Patient limited by fatigue   Behavior During Therapy [facility] for tasks assessed/performed      Past Medical History:  Diagnosis Date  . Allergy   . Arthritis   . Diabetes mellitus without complication (HCC)   . Gout   . Hyperlipidemia   . Hypertension   . Kidney stone   . Rheumatic fever     Past Surgical History:  Procedure Laterality Date  . BACK SURGERY    . knot     removed from neck  . POSTERIOR LUMBAR FUSION 4 LEVEL N/A 10/01/2015   Procedure: Lumbar three-four,  Lumbar four-five,  Lumbar five-Sacrum one posterior lumbar interbody fusion with Laminotomy at Lumbar Two-Three;  Surgeon: Hilda LiasErnesto Botero, MD;  Location: [facility];  Service: Neurosurgery;  Laterality: N/A;    There were no vitals filed for this visit.      Subjective Assessment - 01/26/16 1041    Subjective Patient reports he is having increased swelling into right foot/ankle over the weekend for no apparent reason. He feels both feet not working well today.   Limitations Lifting;Standing;Walking;House hold activities;Other (comment)   Patient Stated Goals get back to full function for work and be able to walk without AD   Currently in Pain? Yes   Pain Score 2    Pain Location Back   Pain Orientation Lower   Pain Descriptors / Indicators Sore   Pain Type  Acute pain;Surgical pain  10/01/15   Pain Onset More than a month ago   Pain Frequency Intermittent      Objective: Gait: forward flexed posture at hips, steppage gait right LE Observation: mild to moderate swelling right ankle AROM/strength right ankle DF decreased 50%   Treatment: Therapeutic exercise: patient performed exercises with verbal, tactile cues demonstration of therapist: Sitting: STM performed along right lumbar paraspinal muscles in conjunction with exercises prior to standing exercises Hip adduction with ball andglute sets x 15 Hip abduction with manual resistance x 15 reps moderate resistance given end range hole 5 seconds Knee extension with4# 1x 15 reps each LE Knee flexion with blueresistive band 1 x 25 reps Standing:  Straight arm pull downs (sitting)blue + blackresistive band 1x 25    At [facility]: single arm row with 5# each UE with staggered stance x 15 reps each Double arm scapular retraction x 15 reps with 10# Side stepping along airex balance beam x 2min Lateral step up with placing opposite foot in front of the other on balance beam x 10 reps each LE Standing balance sotnes with staggered stance on (2) balance stones x 30 seconds with one hand used for balance/support and eyes closed  Patient response to treatment: Patient demonstrated improved technique with exercises with minimal VC for correct alignment. Patient with more erect posture at  end of session with decreased back soreness reported to mild. Improved motor control with repetition and cuing with standing, walking exercieses         PT Education - 01/26/16 1852    Education provided Yes   Education Details HEP: modify exercises to improve ability to exercise   Person(s) Educated Patient   Methods Explanation;Demonstration;Verbal cues   Comprehension Verbalized understanding;Returned demonstration;Verbal cues required             PT Long Term Goals - 01/12/16 1548      PT  LONG TERM GOAL #1   Title Patient will be able to walk safely, erect posture with appropriate AD on level surfaces >600' and stairs by 01/16/2016    Baseline 01/12/16 walking without AD, unable to perform 6 mi. walk due to right hip pain with any walking    Status Deferred     PT LONG TERM GOAL #2   Title Patient will demonstrate improved function for daily activities with mild pain as indicated by modified oswestry score of 30% or less by 02/13/2016   Baseline MODI 62% (severe self perceived disability); MODI 12/05/15 50%; 01/12/2016 = 48%   Status On-going     PT LONG TERM GOAL #3   Title Patient will demonstrate functional community ambulation and low fall rist by of 10 seconds or less by 02/13/2016   Baseline 12/09/15 11.5 seconds; 01/12/2016 deferred   Status On-going     PT LONG TERM GOAL #4   Title Patient will be independent with home program for exercises and self management by 02/13/2016 to continue with progressive healing once discharged from physical therapy    Baseline 01/12/16 limited knowledge of appropriate exercises and progression and requires assistance for performing exercises and progressing intensity appropriately   Status On-going               Plan - 01/26/16 1115    Clinical Impression Statement Patient demonstrated improved endurance and strength in left LE with exercises. He continues with significant limitations with walking and moving due to weakness in core, LE's following back surgery and will benefit from continued physical therapy intervention to achieve goals.     Rehab Potential Good   PT Frequency 3x / week   PT Duration 8 weeks   PT Treatment/Interventions Patient/family education;Gait training;Cryotherapy;Therapeutic activities;Therapeutic exercise;Neuromuscular re-education;Scar mobilization;Manual techniques;Moist Heat   PT Next Visit Plan progress exercises, core control, gait training, weight shifting   PT Home Exercise Plan core  control, LE exercises, balance/walking activities      Patient will benefit from skilled therapeutic intervention in order to improve the following deficits and impairments:  Decreased strength, Decreased activity tolerance, Decreased knowledge of precautions, Pain, Decreased endurance, Decreased range of motion, Difficulty walking, Increased muscle spasms  Visit Diagnosis: Muscle weakness (generalized)  Difficulty in walking, not elsewhere classified     Problem List Patient Active Problem List   Diagnosis Date Noted  . Cellulitis 11/27/2015  . Pedal edema 11/27/2015  . Adjustment disorder with depressed mood   . Osteoarthritis of spine with radiculopathy, lumbar region 10/15/2015  . Abnormal MRI   . Slurred speech   . Surgery, elective   . Loose stools   . OSA (obstructive sleep apnea)   . Acute blood loss anemia   . Pneumonia   . Urinary retention   . Lewy body dementia   . Encephalopathy   . Altered mental status   . Acute encephalopathy 10/12/2015  . Hypokalemia 10/12/2015  .  Anemia 10/12/2015  . S/P lumbar fusion 10/12/2015  . Acute respiratory failure (HCC)   . Degenerative disc disease, lumbar 10/01/2015  . Sciatica of right side associated with disorder of lumbosacral spine 07/21/2015  . Hypertension 02/18/2015  . Diabetes (HCC) 02/18/2015  . Gout 02/18/2015  . Hyperlipidemia 02/18/2015  . Arthritis 02/18/2015  . Encounter to establish care 02/18/2015    Carl BestBrooks, Malayna Noori L 01/26/2016, 7:02 PM  [place] [facility]IC[facility] [address] [place], [name], [address] Phone: [phone]   Fax:  [phone]  Name: Danny StacksGary L Spindler MRN: [id number] Date of Birth: 09-18-1945

## 2016-01-28 ENCOUNTER — Telehealth: Payer: Self-pay | Admitting: *Deleted

## 2016-01-28 ENCOUNTER — Ambulatory Visit: Payer: PPO | Admitting: Physical Therapy

## 2016-01-28 ENCOUNTER — Encounter: Payer: Self-pay | Admitting: Physical Therapy

## 2016-01-28 DIAGNOSIS — R262 Difficulty in walking, not elsewhere classified: Secondary | ICD-10-CM

## 2016-01-28 DIAGNOSIS — M6281 Muscle weakness (generalized): Secondary | ICD-10-CM | POA: Diagnosis not present

## 2016-01-28 NOTE — Therapy (Signed)
[facility] [address] [place], [name], [address] Phone: [phone]   Fax:  [phone]  Physical Therapy Treatment  Patient Details  Name: Danny Patterson MRN: [id number] Date of Birth: May 02, 1945 Referring Provider: Karn CassisBotero, Ernesto M. MD  Encounter Date: 01/28/2016      PT End of Session - 01/28/16 1045    Visit Number 27   Number of Visits 36   Date for PT Re-Evaluation 02/13/16   Authorization Type 27   Authorization Time Period 30 (G code)   PT Start Time 1039   PT Stop Time 1112   PT Time Calculation (min) 33 min   Activity Tolerance Patient tolerated treatment well;Patient limited by fatigue   Behavior [facility] for tasks assessed/performed      Past Medical History:  Diagnosis Date  . Allergy   . Arthritis   . Diabetes mellitus without complication (HCC)   . Gout   . Hyperlipidemia   . Hypertension   . Kidney stone   . Rheumatic fever     Past Surgical History:  Procedure Laterality Date  . BACK SURGERY    . knot     removed from neck  . POSTERIOR LUMBAR FUSION 4 LEVEL N/A 10/01/2015   Procedure: Lumbar three-four,  Lumbar four-five,  Lumbar five-Sacrum one posterior lumbar interbody fusion with Laminotomy at Lumbar Two-Three;  Surgeon: Hilda LiasErnesto Botero, MD;  Location: [facility];  Service: Neurosurgery;  Laterality: N/A;    There were no vitals filed for this visit.      Subjective Assessment - 01/28/16 1043    Subjective Patient reports his feet felt better yesterday afternoon for awhile and then they acted up again for no apparent reason. Today patient reports less pain.    Limitations Lifting;Standing;Walking;House hold activities;Other (comment)   Patient Stated Goals get back to full function for work and be able to walk without AD   Currently in Pain? Yes   Pain Score 6    Pain Location Back   Pain Orientation Lower;Right   Pain Descriptors / Indicators Aching   Pain  Type Acute pain;Surgical pain  10/01/2015   Pain Onset More than a month ago   Pain Frequency Intermittent      Objective: Gait: forward flexed posture at hips, steppage gait right LE, improved erect posture Observation: mild swelling right ankle   Treatment: Therapeutic exercise: patient performed exercises with verbal, tactile cues demonstration of therapist: Sitting: STM performed along right lumbar paraspinal muscles in conjunction with exercises prior to standing exercises Hip adduction with ball andglute sets x 15 Hip abduction with manual resistance x 15 reps moderate resistance given end range hole 5 seconds Knee extension with4# 1x 15 reps each LE Knee flexion with blueresistive band 1x 25 reps Standing:  Straight arm pull downs (sitting)blue + blackresistive band 1x 25 single arm row with 5# each UE with staggered stance x 15 reps each Double arm scapular retraction x 15 reps with blue/black resistive  Side stepping along airex balance beam x 2min Lateral step up with placing opposite foot in front of the other on balance beam x 10 reps each LE  Patient response to treatment: Patient limited by fatigue today and required consistent cuing to perform exercises with correct technique. Patient with more erect posture at end of session with decreased back soreness reported to mild. Improved motor control with repetition and cuing with standing, walking exercieses  PT Long Term Goals - 01/12/16 1548      PT LONG TERM GOAL #1   Title Patient will be able to walk safely, erect posture with appropriate AD on level surfaces >600' and stairs by 01/16/2016    Baseline 01/12/16 walking without AD, unable to perform 6 mi. walk due to right hip pain with any walking    Status Deferred     PT LONG TERM GOAL #2   Title Patient will demonstrate improved function for daily activities with mild pain as indicated by modified oswestry score of 30% or less by  02/13/2016   Baseline MODI 62% (severe self perceived disability); MODI 12/05/15 50%; 01/12/2016 = 48%   Status On-going     PT LONG TERM GOAL #3   Title Patient will demonstrate functional community ambulation and low fall rist by 10MW of 10 seconds or less by 02/13/2016   Baseline 12/09/15 11.5 seconds; 01/12/2016 deferred   Status On-going     PT LONG TERM GOAL #4   Title Patient will be independent with home program for exercises and self management by 02/13/2016 to continue with progressive healing once discharged from physical therapy    Baseline 01/12/16 limited knowledge of appropriate exercises and progression and requires assistance for performing exercises and progressing intensity appropriately   Status On-going               Plan - 01/28/16 1200    Clinical Impression Statement Patient demonstrated improved endurance and strength in left LE with exercises. He continues with weakness and back pain and will require additional physical therapy intervention to achieve goals.    Rehab Potential Good   PT Frequency 3x / week   PT Duration 8 weeks   PT Treatment/Interventions Patient/family education;Gait training;Cryotherapy;Therapeutic activities;Therapeutic exercise;Neuromuscular re-education;Scar mobilization;Manual techniques;Moist Heat   PT Next Visit Plan progress exercises, core control, gait training, weight shifting   PT Home Exercise Plan core control, LE exercises, balance/walking activities      Patient will benefit from skilled therapeutic intervention in order to improve the following deficits and impairments:  Decreased strength, Decreased activity tolerance, Decreased knowledge of precautions, Pain, Decreased endurance, Decreased range of motion, Difficulty walking, Increased muscle spasms  Visit Diagnosis: Muscle weakness (generalized)  Difficulty in walking, not elsewhere classified     Problem List Patient Active Problem List   Diagnosis Date  Noted  . Cellulitis 11/27/2015  . Pedal edema 11/27/2015  . Adjustment disorder with depressed mood   . Osteoarthritis of spine with radiculopathy, lumbar region 10/15/2015  . Abnormal MRI   . Slurred speech   . Surgery, elective   . Loose stools   . OSA (obstructive sleep apnea)   . Acute blood loss anemia   . Pneumonia   . Urinary retention   . Lewy body dementia   . Encephalopathy   . Altered mental status   . Acute encephalopathy 10/12/2015  . Hypokalemia 10/12/2015  . Anemia 10/12/2015  . S/P lumbar fusion 10/12/2015  . Acute respiratory failure (HCC)   . Degenerative disc disease, lumbar 10/01/2015  . Sciatica of right side associated with disorder of lumbosacral spine 07/21/2015  . Hypertension 02/18/2015  . Diabetes (HCC) 02/18/2015  . Gout 02/18/2015  . Hyperlipidemia 02/18/2015  . Arthritis 02/18/2015  . Encounter to establish care 02/18/2015    Beacher MayBrooks, Marie PT 01/29/2016, 5:32 PM  [facility] [address] [place], [name], [address] Phone: [phone]  Fax:  [phone]  Name: Danny Patterson MRN: [id number] Date of Birth: Jan 05, 1946

## 2016-01-28 NOTE — Telephone Encounter (Signed)
PA for methocarbomal has been submitted.[place]

## 2016-01-30 ENCOUNTER — Encounter: Payer: Self-pay | Admitting: Physical Therapy

## 2016-01-30 ENCOUNTER — Ambulatory Visit: Payer: PPO | Admitting: Physical Therapy

## 2016-01-30 DIAGNOSIS — M6281 Muscle weakness (generalized): Secondary | ICD-10-CM

## 2016-01-30 DIAGNOSIS — R262 Difficulty in walking, not elsewhere classified: Secondary | ICD-10-CM

## 2016-01-30 NOTE — Therapy (Signed)
[facility] [address] [place], [name], [address] Phone: [phone]   Fax:  [phone]  Physical Therapy Treatment  Patient Details  Name: Danny StacksGary L Macadam MRN: [id number] Date of Birth: [birth year] Referring Provider: Karn CassisBotero, Ernesto M. MD  Encounter Date: 01/30/2016      PT End of Session - 01/30/16 1043    Visit Number 28   Number of Visits 36   Date for PT Re-Evaluation 02/13/16   Authorization Type 28   Authorization Time Period 30 (G code)   PT Start Time 0949   PT Stop Time 1032   PT Time Calculation (min) 43 min   Activity Tolerance Patient tolerated treatment well;Patient limited by fatigue   Behavior During Therapy [facility] for tasks assessed/performed      Past Medical History:  Diagnosis Date  . Allergy   . Arthritis   . Diabetes mellitus without complication (HCC)   . Gout   . Hyperlipidemia   . Hypertension   . Kidney stone   . Rheumatic fever     Past Surgical History:  Procedure Laterality Date  . BACK SURGERY    . knot     removed from neck  . POSTERIOR LUMBAR FUSION 4 LEVEL N/A 10/01/2015   Procedure: Lumbar three-four,  Lumbar four-five,  Lumbar five-Sacrum one posterior lumbar interbody fusion with Laminotomy at Lumbar Two-Three;  Surgeon: Hilda LiasErnesto Botero, MD;  Location: [facility];  Service: Neurosurgery;  Laterality: N/A;    There were no vitals filed for this visit.      Subjective Assessment - 01/30/16 0956    Subjective Patient reports back soreness today, better than the other day. Good day with walking yesterday!   Limitations Lifting;Standing;Walking;House hold activities;Other (comment)   Patient Stated Goals get back to full function for work and be able to walk without AD   Currently in Pain? Yes   Pain Score 6    Pain Location Back   Pain Orientation Right;Lower   Pain Descriptors / Indicators Aching   Pain Type Acute pain;Surgical pain  10/01/2015   Pain Onset  More than a month ago   Pain Frequency Intermittent      Objective: Gait: forward flexed posture at hips, decreased steppage gait right as compared to previous session     Treatment: Therapeutic exercise: patient performed exercises with verbal, tactile cues demonstration of therapist: Sitting: Moist heat applied to lower back with patient in chair while performing sitting exercises x 10 min: no adverse reactions noted  Hip adduction with ball andglute sets x 15 Hip abduction with manual resistance x 15 reps moderate resistance given end range hole 5 seconds Knee extension with4# 2x 15 reps each LE Knee flexion with blueresistive band 1x 25 reps Standing: at cable: OMEGA Straight arm pull downs 15# 1 x 15 single arm row with 5# each UE with staggered stance x 15 reps each Double arm scapular retraction x 15 reps with 10# Standing triceps, biceps 10# x 10 reps each Side stepping along airex balance beam x 2min Lateral step up with placing opposite foot in front of the other on balance beam x 10 reps each LE Walk on balance stones x 5 sets with using UE's for balance as needed  Patient response to treatment: Patient demonstrated improved technique with exercises with minimal VC for correct alignment. Patient reported decreased soreness in lower back with moist heat, mild increased soreness with standing core exercises, better following short rest.  Improved posture with exercises with increased endurance noted          PT Education - 01/30/16 0959    Education provided Yes   Education Details HEP: modify exercises to improve strengthening benefit, consider AFO for right LE   Person(s) Educated Patient   Methods Explanation;Demonstration;Verbal cues   Comprehension Verbalized understanding;Returned demonstration;Verbal cues required             PT Long Term Goals - 01/12/16 1548      PT LONG TERM GOAL #1   Title Patient will be able to walk safely, erect  posture with appropriate AD on level surfaces >600' and stairs by 01/16/2016    Baseline 01/12/16 walking without AD, unable to perform 6 mi. walk due to right hip pain with any walking    Status Deferred     PT LONG TERM GOAL #2   Title Patient will demonstrate improved function for daily activities with mild pain as indicated by modified oswestry score of 30% or less by 02/13/2016   Baseline MODI 62% (severe self perceived disability); MODI 12/05/15 50%; 01/12/2016 = 48%   Status On-going     PT LONG TERM GOAL #3   Title Patient will demonstrate functional community ambulation and low fall rist by 10MW of 10 seconds or less by 02/13/2016   Baseline 12/09/15 11.5 seconds; 01/12/2016 deferred   Status On-going     PT LONG TERM GOAL #4   Title Patient will be independent with home program for exercises and self management by 02/13/2016 to continue with progressive healing once discharged from physical therapy    Baseline 01/12/16 limited knowledge of appropriate exercises and progression and requires assistance for performing exercises and progressing intensity appropriately   Status On-going               Plan - 01/30/16 1044    Clinical Impression Statement Patient demonstrates improved posture with decreased spasms s/p moist heat to back. improved balance with exercises on balance stones with less UE support needed and improved endurance as noted with less rest breaks following standing activities. He continues to heal from back surgery with weakness and decreased core control and weakness in LE's that will benefit from additional physical therapy intervention to achieve maximal function.    Rehab Potential Good   PT Frequency 3x / week   PT Duration 8 weeks   PT Treatment/Interventions Patient/family education;Gait training;Cryotherapy;Therapeutic activities;Therapeutic exercise;Neuromuscular re-education;Scar mobilization;Manual techniques;Moist Heat   PT Next Visit Plan progress  exercises, core control, gait training, weight shifting   PT Home Exercise Plan core control, LE exercises, balance/walking activities      Patient will benefit from skilled therapeutic intervention in order to improve the following deficits and impairments:  Decreased strength, Decreased activity tolerance, Decreased knowledge of precautions, Pain, Decreased endurance, Decreased range of motion, Difficulty walking, Increased muscle spasms  Visit Diagnosis: Muscle weakness (generalized)  Difficulty in walking, not elsewhere classified     Problem List Patient Active Problem List   Diagnosis Date Noted  . Cellulitis 11/27/2015  . Pedal edema 11/27/2015  . Adjustment disorder with depressed mood   . Osteoarthritis of spine with radiculopathy, lumbar region 10/15/2015  . Abnormal MRI   . Slurred speech   . Surgery, elective   . Loose stools   . OSA (obstructive sleep apnea)   . Acute blood loss anemia   . Pneumonia   . Urinary retention   . Lewy body dementia   . Encephalopathy   .  Altered mental status   . Acute encephalopathy 10/12/2015  . Hypokalemia 10/12/2015  . Anemia 10/12/2015  . S/P lumbar fusion 10/12/2015  . Acute respiratory failure (HCC)   . Degenerative disc disease, lumbar 10/01/2015  . Sciatica of right side associated with disorder of lumbosacral spine 07/21/2015  . Hypertension 02/18/2015  . Diabetes (HCC) 02/18/2015  . Gout 02/18/2015  . Hyperlipidemia 02/18/2015  . Arthritis 02/18/2015  . Encounter to establish care 02/18/2015    Beacher May PT 01/31/2016, 1:40 PM   [facility] PHYS[facility] AND SPORTS MEDICINE [address], [place], [address] Phone: [phone]   Fax:  [phone]  Name: ROLLIN KOTOWSKI MRN: [id number] Date of Birth: 1945-07-20

## 2016-02-02 ENCOUNTER — Ambulatory Visit: Payer: PPO | Admitting: Physical Therapy

## 2016-02-02 ENCOUNTER — Encounter: Payer: Self-pay | Admitting: Physical Therapy

## 2016-02-02 DIAGNOSIS — M6281 Muscle weakness (generalized): Secondary | ICD-10-CM

## 2016-02-02 DIAGNOSIS — R262 Difficulty in walking, not elsewhere classified: Secondary | ICD-10-CM

## 2016-02-03 NOTE — Therapy (Signed)
[facility] [address], [place], [address] Phone: [phone]   Fax:  [phone]  Physical Therapy Treatment  Patient Details  Name: Danny Patterson MRN: [id number] Date of Birth: 08/07/1945 Referring Provider: Karn Cassis MD  Encounter Date: 02/02/2016      PT End of Session - 02/02/16 1402    Visit Number 29   Number of Visits 36   Date for PT Re-Evaluation 02/13/16   Authorization Type 29   Authorization Time Period 30 (G code)   PT Start Time 1019   PT Stop Time 1100   PT Time Calculation (min) 41 min   Activity Tolerance Patient tolerated treatment well;Patient limited by fatigue   Behavior During Therapy [facility] for tasks assessed/performed      Past Medical History:  Diagnosis Date  . Allergy   . Arthritis   . Diabetes mellitus without complication (HCC)   . Gout   . Hyperlipidemia   . Hypertension   . Kidney stone   . Rheumatic fever     Past Surgical History:  Procedure Laterality Date  . BACK SURGERY    . knot     removed from neck  . POSTERIOR LUMBAR FUSION 4 LEVEL N/A 10/01/2015   Procedure: Lumbar three-four,  Lumbar four-five,  Lumbar five-Sacrum one posterior lumbar interbody fusion with Laminotomy at Lumbar Two-Three;  Surgeon: Hilda Lias, MD;  Location: [facility];  Service: Neurosurgery;  Laterality: N/A;    There were no vitals filed for this visit.      Subjective Assessment - 02/02/16 1023    Subjective Patient reports feeling better. His feet still feel "thick" in the ball of his feet in his shoes.    Limitations Lifting;Standing;Walking;House hold activities;Other (comment)   Patient Stated Goals get back to full function for work and be able to walk without AD   Currently in Pain? Yes   Pain Score 3    Pain Location Back   Pain Orientation Right;Lower   Pain Descriptors / Indicators Aching   Pain Type Acute pain;Surgical pain  10/01/2015   Pain  Onset More than a month ago   Pain Frequency Intermittent         Objective:     Gait: improved posture with decreased lean to left and decreased forward flexion at hips     Strength: decreased right ankle DF 3-/5, decreased hip flexion left 4-/5, knee flexion left 4-/5   Treatment: Therapeutic exercise: patient performed exercises with verbal, tactile cues demonstration of therapist: Sitting: Moist heat applied to lower back with patient in chair while performing sitting exercises x 10 min: no adverse reactions noted  Hip adduction with ball andglute sets x 15 Hip abduction with manual resistance x 15 reps moderate resistance given end range hole 5 seconds Knee extension with4# 2x 15 reps each LE Knee flexion with blueresistive band 1x 25 reps Standing: at cable: OMEGA Straight arm pull downs 15# 1 x 15 single arm row with 5# each UE with staggered stance x 15 reps each Double arm scapular retraction x 15 reps with 10# Standing triceps, biceps 10# x 10 reps each Side stepping along airex balance beam x Lateral step up with placing opposite foot in front of the other on airex balance beam x 10 reps each LE Walk on balance stones x 5 sets with using UE's for balance as needed Sit to stand from chair with balance pad on chair  2 x 5 reps alternated with AROM ankle DF/PF x 15 reps each       Patient response to treatment: Patient required minimal cuing to perform exercises with good technique. He required close supervision for safety with standing, walking activities due to weakness and decreased DF of right foot.        Patient reported decreased soreness in lower back with moist heat, less soreness with standing core exercises as compared to previous session.         PT Education - 02/02/16 1100    Education provided Yes   Education Details HEP: continue to work on balance, walking exercises    Person(s) Educated Patient   Methods Explanation;Demonstration;Verbal  cues   Comprehension Verbalized understanding;Returned demonstration;Verbal cues required             PT Long Term Goals - 01/12/16 1548      PT LONG TERM GOAL #1   Title Patient will be able to walk safely, erect posture with appropriate AD on level surfaces >600' and stairs by 01/16/2016    Baseline 01/12/16 walking without AD, unable to perform 6 mi. walk due to right hip pain with any walking    Status Deferred     PT LONG TERM GOAL #2   Title Patient will demonstrate improved function for daily activities with mild pain as indicated by modified oswestry score of 30% or less by 02/13/2016   Baseline MODI 62% (severe self perceived disability); MODI 12/05/15 50%; 01/12/2016 = 48%   Status On-going     PT LONG TERM GOAL #3   Title Patient will demonstrate functional community ambulation and low fall rist by 10MW of 10 seconds or less by 02/13/2016   Baseline 12/09/15 11.5 seconds; 01/12/2016 deferred   Status On-going     PT LONG TERM GOAL #4   Title Patient will be independent with home program for exercises and self management by 02/13/2016 to continue with progressive healing once discharged from physical therapy    Baseline 01/12/16 limited knowledge of appropriate exercises and progression and requires assistance for performing exercises and progressing intensity appropriately   Status On-going               Plan - 02/02/16 1402    Clinical Impression Statement Patient demonstrates improved posture, decreased lower back pain with moist heat and is able to perform exercises with less frequent rest breaks indicating improving endurance and strength.    Rehab Potential Good   PT Frequency 3x / week   PT Duration 8 weeks   PT Treatment/Interventions Patient/family education;Gait training;Cryotherapy;Therapeutic activities;Therapeutic exercise;Neuromuscular re-education;Scar mobilization;Manual techniques;Moist Heat   PT Next Visit Plan progress exercises, core control,  gait training, weight shifting   PT Home Exercise Plan core control, LE exercises, balance/walking activities      Patient will benefit from skilled therapeutic intervention in order to improve the following deficits and impairments:  Decreased strength, Decreased activity tolerance, Decreased knowledge of precautions, Pain, Decreased endurance, Decreased range of motion, Difficulty walking, Increased muscle spasms  Visit Diagnosis: Muscle weakness (generalized)  Difficulty in walking, not elsewhere classified     Problem List Patient Active Problem List   Diagnosis Date Noted  . Cellulitis 11/27/2015  . Pedal edema 11/27/2015  . Adjustment disorder with depressed mood   . Osteoarthritis of spine with radiculopathy, lumbar region 10/15/2015  . Abnormal MRI   . Slurred speech   . Surgery, elective   . Loose stools   . OSA (  obstructive sleep apnea)   . Acute blood loss anemia   . Pneumonia   . Urinary retention   . Lewy body dementia   . Encephalopathy   . Altered mental status   . Acute encephalopathy 10/12/2015  . Hypokalemia 10/12/2015  . Anemia 10/12/2015  . S/P lumbar fusion 10/12/2015  . Acute respiratory failure (HCC)   . Degenerative disc disease, lumbar 10/01/2015  . Sciatica of right side associated with disorder of lumbosacral spine 07/21/2015  . Hypertension 02/18/2015  . Diabetes (HCC) 02/18/2015  . Gout 02/18/2015  . Hyperlipidemia 02/18/2015  . Arthritis 02/18/2015  . Encounter to establish care 02/18/2015    Beacher MayBrooks, Marie PT 02/03/2016, 2:05 PM  [place] [facility] [address] [place], [name], [address] Phone: [phone]   Fax:  [phone]  Name: Danny Patterson MRN: [id number] Date of Birth: 04-11-1945

## 2016-02-04 ENCOUNTER — Ambulatory Visit: Payer: PPO | Admitting: Physical Therapy

## 2016-02-04 ENCOUNTER — Encounter: Payer: Self-pay | Admitting: Physical Therapy

## 2016-02-04 DIAGNOSIS — R262 Difficulty in walking, not elsewhere classified: Secondary | ICD-10-CM

## 2016-02-04 DIAGNOSIS — M6281 Muscle weakness (generalized): Secondary | ICD-10-CM

## 2016-02-05 NOTE — Therapy (Signed)
[place] [facility] [address] [place], [name], [address] Phone: [phone]   Fax:  [phone]  Physical Therapy Treatment  Patient Details  Name: Danny Patterson MRN: [id number] Date of Birth: June 07, 1945 Referring Provider: Karn CassisBotero, Ernesto M. MD  Encounter Date: 02/04/2016      PT End of Session - 02/04/16 1036    Visit Number 30   Number of Visits 36   Date for PT Re-Evaluation 02/13/16   Authorization Type 30   Authorization Time Period 30 (G code)   PT Start Time 1024   PT Stop Time 1110   PT Time Calculation (min) 46 min   Activity Tolerance Patient tolerated treatment well;Patient limited by fatigue   Behavior During Therapy [facility] for tasks assessed/performed      Past Medical History:  Diagnosis Date  . Allergy   . Arthritis   . Diabetes mellitus without complication (HCC)   . Gout   . Hyperlipidemia   . Hypertension   . Kidney stone   . Rheumatic fever     Past Surgical History:  Procedure Laterality Date  . BACK SURGERY    . knot     removed from neck  . POSTERIOR LUMBAR FUSION 4 LEVEL N/A 10/01/2015   Procedure: Lumbar three-four,  Lumbar four-five,  Lumbar five-Sacrum one posterior lumbar interbody fusion with Laminotomy at Lumbar Two-Three;  Surgeon: Hilda LiasErnesto Botero, MD;  Location: [facility];  Service: Neurosurgery;  Laterality: N/A;    There were no vitals filed for this visit.      Subjective Assessment - 02/04/16 1027    Subjective Patient reports he is still sore in lower back. Stiff in mornings and as the propresses improves until the evening when he is tired. He cannot dress without difficulty and changing the way he normally dresses lower body, socks and shoes. He cannot lift like he did and cannot work in Scientist, water qualityA/C/heating. He feels ~ 50% limited in his abilities to perform daily tasks without pain, difficulty due to weakness and pain and neuropathy in feet.    Limitations  Lifting;Standing;Walking;House hold activities;Other (comment)   Patient Stated Goals get back to full function for work and be able to walk without AD   Currently in Pain? Yes   Pain Score 3    Pain Location Back   Pain Orientation Lower;Right   Pain Descriptors / Indicators Aching   Pain Type Acute pain;Surgical pain  10/01/15   Pain Onset More than a month ago   Pain Frequency Intermittent          Objective:      Gait: improved posture with decreased lean to left and decreased forward flexion at hips      Strength: decreased right ankle DF 3-/5, decreased hip flexion left 4-/5, knee flexion left 4-/5      Outcome measures:      10MW: 12.25 seconds ( .6786m/s) in need of continued intervention for community ambulator      6 min. Walk  800' (decreased for age and endurance due to back pain, fatigue in general: in need of intervention) MODI 48% impairment  Treatment: Therapeutic exercise: patient performed exercises with verbal, tactile cues demonstration of therapist: Sitting: Moist heat applied to lower back with patient in chair while performing sitting exercises x 10 min: no adverse reactions noted  Hip adduction with ball andglute sets x 15 Hip abduction with manual resistance x 15 reps moderate resistance given end range hole  5 seconds Knee extension with4# x 15 reps each LE Knee flexion with blueresistive band 1x 25 reps Sitting/standing:  Straight arm pull downs blue + black tubing with assistance 1 x 15 single arm row with doubled black tubing each UE with staggered stance x 15 reps each Double arm scapular retraction x 15 reps with blue+black band x 15 Side stepping along airex balance beam x Lateral step up with placing opposite foot in front of the other on airex balance beam x 10 reps each LE Walk forward and backwards at counter with controlled motion and increased step length x 1 min. 6 min. Walk test, test       Patient response to treatment:  Patient demonstrated good endurance with exercises with minimal rest periods; able to complete only 4 min. Of 6 min. Walk test due to pain in lower back and fatigue. Performance with walking, step          ups improved with repetition.          PT Education - 02/04/16 1034    Education provided Yes   Education Details HEP: work on heel toe gait pattern, walking to encourage decreasing neuropathy    Person(s) Educated Patient   Methods Explanation;Demonstration;Verbal cues   Comprehension Verbalized understanding;Returned demonstration;Verbal cues required             PT Long Term Goals - 02/04/16 1114      PT LONG TERM GOAL #1   Title Patient will be able to walk safely, erect posture with appropriate AD on level surfaces >600' and stairs by 01/16/2016    Baseline 01/12/16 walking without AD, unable to perform 6 mi. walk due to right hip pain with any walking 02/05/2016 800' without AD  (limited to 4 min. due to fatigue, lower back pain)   Status Achieved     PT LONG TERM GOAL #2   Title Patient will demonstrate improved function for daily activities with mild pain as indicated by modified oswestry score of 30% or less by 02/13/2016   Baseline MODI 62% (severe self perceived disability); MODI 12/05/15 50%; 01/12/2016 = 48%   Status On-going     PT LONG TERM GOAL #3   Title Patient will demonstrate functional community ambulation and low fall rist by of 10 seconds or less by 02/13/2016   Baseline 12/09/15 11.5 seconds; 01/12/2016 deferred; 02/05/16 12.25 seconds     PT LONG TERM GOAL #4   Title Patient will be independent with home program for exercises and self management by 02/13/2016 to continue with progressive healing once discharged from physical therapy    Baseline 01/12/16 limited knowledge of appropriate exercises and progression and requires assistance for performing exercises and progressing intensity appropriately   Status On-going               Plan -  02/04/16 1111    Clinical Impression Statement Patient is progressing well with improving control of core, increasing LE strength and improved endurance for daily tasks. He continues with significant weakness and decreased endurance and demonstrates impairment of 45% for functional tasks based on MODI, and 6 min. walk tests and patient interview and will therefore benefit from continued physical therapy to reach maximal function with less than 20% impairment.    Rehab Potential Good   PT Frequency 3x / week   PT Duration 8 weeks   PT Treatment/Interventions Patient/family education;Gait training;Cryotherapy;Therapeutic activities;Therapeutic exercise;Neuromuscular re-education;Scar mobilization;Manual techniques;Moist Heat   PT Next Visit  Plan progress exercises, core control, gait training, weight shifting   PT Home Exercise Plan core control, LE exercises, balance/walking activities      Patient will benefit from skilled therapeutic intervention in order to improve the following deficits and impairments:  Decreased strength, Decreased activity tolerance, Decreased knowledge of precautions, Pain, Decreased endurance, Decreased range of motion, Difficulty walking, Increased muscle spasms  Visit Diagnosis: Muscle weakness (generalized)  Difficulty in walking, not elsewhere classified       G-Codes - 02/04/16 1112    Functional Assessment Tool Used MODI, pain scale, ROM, strength deficits   Functional Limitation Mobility: Walking and moving around   Mobility: Walking and Moving Around Current Status [phone]([id number]) At least 40 percent but less than 60 percent impaired, limited or restricted   Mobility: Walking and Moving Around Goal Status [phone](G8979) At least 1 percent but less than 20 percent impaired, limited or restricted      Problem List Patient Active Problem List   Diagnosis Date Noted  . Cellulitis 11/27/2015  . Pedal edema 11/27/2015  . Adjustment disorder with depressed mood   .  Osteoarthritis of spine with radiculopathy, lumbar region 10/15/2015  . Abnormal MRI   . Slurred speech   . Surgery, elective   . Loose stools   . OSA (obstructive sleep apnea)   . Acute blood loss anemia   . Pneumonia   . Urinary retention   . Lewy body dementia   . Encephalopathy   . Altered mental status   . Acute encephalopathy 10/12/2015  . Hypokalemia 10/12/2015  . Anemia 10/12/2015  . S/P lumbar fusion 10/12/2015  . Acute respiratory failure (HCC)   . Degenerative disc disease, lumbar 10/01/2015  . Sciatica of right side associated with disorder of lumbosacral spine 07/21/2015  . Hypertension 02/18/2015  . Diabetes (HCC) 02/18/2015  . Gout 02/18/2015  . Hyperlipidemia 02/18/2015  . Arthritis 02/18/2015  . Encounter to establish care 02/18/2015    Beacher MayBrooks, Kineta Fudala PT 02/05/2016, 3:08 PM  [facility] [address] [place], [name], [address] Phone: [phone]   Fax:  [phone]  Name: Danny Patterson MRN: [id number] Date of Birth: Mar 13, 1946

## 2016-02-06 ENCOUNTER — Ambulatory Visit: Payer: PPO | Admitting: Physical Therapy

## 2016-02-06 ENCOUNTER — Encounter: Payer: Self-pay | Admitting: Physical Therapy

## 2016-02-06 DIAGNOSIS — M6281 Muscle weakness (generalized): Secondary | ICD-10-CM

## 2016-02-06 DIAGNOSIS — R262 Difficulty in walking, not elsewhere classified: Secondary | ICD-10-CM

## 2016-02-07 NOTE — Therapy (Signed)
[place] [facility] [address], [place], [address] Phone: [phone]   Fax:  [phone]  Physical Therapy Treatment  Patient Details  Name: Danny Patterson MRN: [id number] Date of Birth: 05-Apr-1945 Referring Provider: Karn Cassis MD  Encounter Date: 02/06/2016      PT End of Session - 02/06/16 1100    Visit Number 31   Number of Visits 36   Date for PT Re-Evaluation 02/13/16   Authorization Type 31   Authorization Time Period 40 (G code)   PT Start Time 1005   PT Stop Time 1048   PT Time Calculation (min) 43 min   Activity Tolerance Patient tolerated treatment well;Patient limited by fatigue   Behavior During Therapy [facility] for tasks assessed/performed      Past Medical History:  Diagnosis Date  . Allergy   . Arthritis   . Diabetes mellitus without complication (HCC)   . Gout   . Hyperlipidemia   . Hypertension   . Kidney stone   . Rheumatic fever     Past Surgical History:  Procedure Laterality Date  . BACK SURGERY    . knot     removed from neck  . POSTERIOR LUMBAR FUSION 4 LEVEL N/A 10/01/2015   Procedure: Lumbar three-four,  Lumbar four-five,  Lumbar five-Sacrum one posterior lumbar interbody fusion with Laminotomy at Lumbar Two-Three;  Surgeon: Hilda Lias, MD;  Location: [facility];  Service: Neurosurgery;  Laterality: N/A;    There were no vitals filed for this visit.      Subjective Assessment - 02/06/16 1015    Subjective Patient reports he is still sore in lower back. Stiff in mornings and as the propresses improves until the evening when he is tired. Moist heat helped with decreasing soreness last visit.    Limitations Lifting;Standing;Walking;House hold activities;Other (comment)   Patient Stated Goals get back to full function for work and be able to walk without AD   Currently in Pain? Yes   Pain Score 3    Pain Location Back   Pain Orientation Right;Lower   Pain  Descriptors / Indicators Aching   Pain Type Acute pain;Surgical pain  10/01/2015   Pain Onset More than a month ago   Pain Frequency Intermittent           Objective:      Gait: increased forward flexion at hips today as compared to previous session       Treatment: Therapeutic exercise: patient performed exercises with verbal, tactile cues demonstration of therapist: Sitting: Moist heat applied to lower back with patient in chair while performing sitting exercises x 10 min: no adverse reactions noted  Wobble board for  Ankle DF/PF x 2 min. Each direction Hip adduction with ball andglute sets x 15 Hip abduction with manual resistance x 15 reps moderate resistance given end range hole 5 seconds Knee extension with4# 2 x 15 reps each LE Knee flexion with blueresistive band 1x 25 reps Sitting/standing:  Straight arm pull downs blue + black tubing with assistance 1 x 15 single arm row with 5# weight at cable machine each UE with staggered stance x 15 reps each Double arm scapular retraction x 15 reps with 10# cable x 15 Side stepping along airex balance beam x Lateral step up with placing opposite foot in front of the other on airex balance beam x 10 reps each LE   Sit to stand from chair with airex pad  with ball between knees 3 x 5 reps (alternate each LE back for 5 reps)      Patient response to treatment: Patient demonstrated good technique with exercises with assistance, VC and demonstration. Patient with good endurance with minimal rest periods. Mild fatigue, soreness in lower back with standing          and walking activities. improved motor control with repetition.          PT Education - 02/06/16 1100    Education provided Yes   Education Details HEP: DF/PF with wobble board; sit to stand with staggered stance   Person(s) Educated Patient   Methods Explanation;Demonstration;Verbal cues   Comprehension Verbalized understanding;Returned  demonstration;Verbal cues required             PT Long Term Goals - 02/04/16 1114      PT LONG TERM GOAL #1   Title Patient will be able to walk safely, erect posture with appropriate AD on level surfaces >600' and stairs by 01/16/2016    Baseline 01/12/16 walking without AD, unable to perform 6 mi. walk due to right hip pain with any walking 02/05/2016 800' without AD  (limited to 4 min. due to fatigue, lower back pain)   Status Achieved     PT LONG TERM GOAL #2   Title Patient will demonstrate improved function for daily activities with mild pain as indicated by modified oswestry score of 30% or less by 02/13/2016   Baseline MODI 62% (severe self perceived disability); MODI 12/05/15 50%; 01/12/2016 = 48%   Status On-going     PT LONG TERM GOAL #3   Title Patient will demonstrate functional community ambulation and low fall rist by 10MW of 10 seconds or less by 02/13/2016   Baseline 12/09/15 11.5 seconds; 01/12/2016 deferred; 02/05/16 12.25 seconds     PT LONG TERM GOAL #4   Title Patient will be independent with home program for exercises and self management by 02/13/2016 to continue with progressive healing once discharged from physical therapy    Baseline 01/12/16 limited knowledge of appropriate exercises and progression and requires assistance for performing exercises and progressing intensity appropriately   Status On-going               Plan - 02/06/16 1102    Clinical Impression Statement Patient is progressing with improving endurance and strength to allow improved posture and ability to walk for longer distances and with less fatigue. He requires assistance and guidance with VC to perform exercises with correct technique, intensity and will therefore require additional physical therapy intervention.    Rehab Potential Good   PT Frequency 3x / week   PT Duration 8 weeks   PT Treatment/Interventions Patient/family education;Gait training;Cryotherapy;Therapeutic  activities;Therapeutic exercise;Neuromuscular re-education;Scar mobilization;Manual techniques;Moist Heat   PT Next Visit Plan progress exercises, core control, gait training, weight shifting   PT Home Exercise Plan core control, LE exercises, balance/walking activities      Patient will benefit from skilled therapeutic intervention in order to improve the following deficits and impairments:  Decreased strength, Decreased activity tolerance, Decreased knowledge of precautions, Pain, Decreased endurance, Decreased range of motion, Difficulty walking, Increased muscle spasms  Visit Diagnosis: Muscle weakness (generalized)  Difficulty in walking, not elsewhere classified     Problem List Patient Active Problem List   Diagnosis Date Noted  . Cellulitis 11/27/2015  . Pedal edema 11/27/2015  . Adjustment disorder with depressed mood   . Osteoarthritis of spine with radiculopathy, lumbar region 10/15/2015  .  Abnormal MRI   . Slurred speech   . Surgery, elective   . Loose stools   . OSA (obstructive sleep apnea)   . Acute blood loss anemia   . Pneumonia   . Urinary retention   . Lewy body dementia   . Encephalopathy   . Altered mental status   . Acute encephalopathy 10/12/2015  . Hypokalemia 10/12/2015  . Anemia 10/12/2015  . S/P lumbar fusion 10/12/2015  . Acute respiratory failure (HCC)   . Degenerative disc disease, lumbar 10/01/2015  . Sciatica of right side associated with disorder of lumbosacral spine 07/21/2015  . Hypertension 02/18/2015  . Diabetes (HCC) 02/18/2015  . Gout 02/18/2015  . Hyperlipidemia 02/18/2015  . Arthritis 02/18/2015  . Encounter to establish care 02/18/2015    Beacher MayBrooks, Marie PT 02/07/2016, 4:13 PM  [facility] [facility] [address] [place], [name], [address] Phone: [phone]   Fax:  [phone]  Name: Danny Patterson MRN: [id number] Date of Birth: [birth year]

## 2016-02-09 ENCOUNTER — Ambulatory Visit: Payer: PPO | Admitting: Physical Therapy

## 2016-02-09 ENCOUNTER — Encounter: Payer: Self-pay | Admitting: Physical Therapy

## 2016-02-09 DIAGNOSIS — M6281 Muscle weakness (generalized): Secondary | ICD-10-CM | POA: Diagnosis not present

## 2016-02-09 DIAGNOSIS — R262 Difficulty in walking, not elsewhere classified: Secondary | ICD-10-CM

## 2016-02-10 NOTE — Therapy (Signed)
[facility] [address] [place], [name], [address] Phone: [phone]   Fax:  [phone]  Physical Therapy Treatment  Patient Details  Name: Danny Patterson MRN: [id number] Date of Birth: Apr 09, 1945 Referring Provider: Karn CassisBotero, Ernesto M. MD  Encounter Date: 02/09/2016      PT End of Session - 02/09/16 1120    Visit Number 32   Number of Visits 36   Date for PT Re-Evaluation 02/13/16   Authorization Type [id number]   Authorization Time Period 40 (G code)   PT Start Time 1034   PT Stop Time 1116   PT Time Calculation (min) 42 min   Activity Tolerance Patient tolerated treatment well;Patient limited by fatigue   [facility]      Past Medical History:  Diagnosis Date  . Allergy   . Arthritis   . Diabetes mellitus without complication (HCC)   . Gout   . Hyperlipidemia   . Hypertension   . Kidney stone   . Rheumatic fever     Past Surgical History:  Procedure Laterality Date  . BACK SURGERY    . knot     removed from neck  . POSTERIOR LUMBAR FUSION 4 LEVEL N/A 10/01/2015   Procedure: Lumbar three-four,  Lumbar four-five,  Lumbar five-Sacrum one posterior lumbar interbody fusion with Laminotomy at Lumbar Two-Three;  Surgeon: Hilda LiasErnesto Botero, MD;  Location: [facility];  Service: Neurosurgery;  Laterality: N/A;    There were no vitals filed for this visit.      Subjective Assessment - 02/09/16 1042    Subjective Patient reports he is still sore in lower back and feels better with moist heat treatment. He is improving with strength with exercise and guidance of therapist.    Limitations Lifting;Standing;Walking;House hold activities;Other (comment)   Patient Stated Goals get back to full function for work and be able to walk without AD   Currently in Pain? Yes   Pain Score 3    Pain Location Back   Pain Orientation Right;Lower   Pain Descriptors / Indicators  Aching   Pain Type Acute pain;Surgical pain  10/01/15   Pain Onset More than a month ago   Pain Frequency Intermittent       Objective: Gait:  forward flexion at hips, decreased trunk rotation  Treatment: Therapeutic exercise: patient performed exercises with verbal, tactile cues anddemonstration of therapist: Sitting: Moist heat applied to lower back with patient in chair while performing sitting exercises x 10 min: no adverse reactions noted  Wobble board for  Ankle DF/PF x 2 min. Each direction Hip adduction with ball andglute sets x 15 Hip abduction with manual resistance x 15 reps moderate resistance given end range hole 5 seconds Knee extension with4# 2 x 15 reps each LE Knee flexion with blueresistive band 1x 25 reps Sitting/standing:  single arm row with 5# weight at cable machineeach UE with staggered stance x 15 reps each Double arm scapular retraction x 15 reps with 10# cable x 15 Side stepping along airex balance beam x 2min Lateral step up with placing opposite foot in front of the other on airex balance beam x 10 reps each LE Sit to stand from high table with ball between knees and concentrate on putting weight through heels 3 x 5 reps Sitting slouch/correct with tactile cues to engage trunk extensors x 10 reps  Patient response to treatment: Patient demonstrated improved technique with exercises with minimal  VC and repetition. Patient demonstrated improved posture and ability to perform trunk extension in sitting with tactile cuing and repetition. . Patient with improved endurance and reported increased soreness in lower back at end of session, decreased following 3 min. Rest.         PT Education - 02/09/16 1117    Education provided Yes   Education Details HEP: continue with exercises for LE's, back posture correction with extension   Person(s) Educated Patient   Methods Explanation;Demonstration;Tactile cues;Verbal cues   Comprehension Verbalized  understanding;Returned demonstration;Verbal cues required;Tactile cues required             PT Long Term Goals - 02/04/16 1114      PT LONG TERM GOAL #1   Title Patient will be able to walk safely, erect posture with appropriate AD on level surfaces >600' and stairs by 01/16/2016    Baseline 01/12/16 walking without AD, unable to perform 6 mi. walk due to right hip pain with any walking 02/05/2016 800' without AD  (limited to 4 min. due to fatigue, lower back pain)   Status Achieved     PT LONG TERM GOAL #2   Title Patient will demonstrate improved function for daily activities with mild pain as indicated by modified oswestry score of 30% or less by 02/13/2016   Baseline MODI 62% (severe self perceived disability); MODI 12/05/15 50%; 01/12/2016 = 48%   Status On-going     PT LONG TERM GOAL #3   Title Patient will demonstrate functional community ambulation and low fall rist by of 10 seconds or less by 02/13/2016   Baseline 12/09/15 11.5 seconds; 01/12/2016 deferred; 02/05/16 12.25 seconds     PT LONG TERM GOAL #4   Title Patient will be independent with home program for exercises and self management by 02/13/2016 to continue with progressive healing once discharged from physical therapy    Baseline 01/12/16 limited knowledge of appropriate exercises and progression and requires assistance for performing exercises and progressing intensity appropriately   Status On-going               Plan - 02/09/16 1125    Clinical Impression Statement Patient continues to progress with decreasing symptoms and improvement with strength in LE's and core. His posture is more erect and midline with decreasing spasms in right lower back. He continues with decreased strength, endurance and will benefit from additional physical therapy intervention to achieve goals.    Rehab Potential Good   PT Frequency 3x / week   PT Duration 8 weeks   PT Treatment/Interventions Patient/family  education;Gait training;Cryotherapy;Therapeutic activities;Therapeutic exercise;Neuromuscular re-education;Scar mobilization;Manual techniques;Moist Heat   PT Next Visit Plan progress exercises, core control, gait training, weight shifting   PT Home Exercise Plan core control, LE exercises, balance/walking activities      Patient will benefit from skilled therapeutic intervention in order to improve the following deficits and impairments:  Decreased strength, Decreased activity tolerance, Decreased knowledge of precautions, Pain, Decreased endurance, Decreased range of motion, Difficulty walking, Increased muscle spasms  Visit Diagnosis: Muscle weakness (generalized)  Difficulty in walking, not elsewhere classified     Problem List Patient Active Problem List   Diagnosis Date Noted  . Cellulitis 11/27/2015  . Pedal edema 11/27/2015  . Adjustment disorder with depressed mood   . Osteoarthritis of spine with radiculopathy, lumbar region 10/15/2015  . Abnormal MRI   . Slurred speech   . Surgery, elective   . Loose stools   . OSA (obstructive sleep  apnea)   . Acute blood loss anemia   . Pneumonia   . Urinary retention   . Lewy body dementia   . Encephalopathy   . Altered mental status   . Acute encephalopathy 10/12/2015  . Hypokalemia 10/12/2015  . Anemia 10/12/2015  . S/P lumbar fusion 10/12/2015  . Acute respiratory failure (HCC)   . Degenerative disc disease, lumbar 10/01/2015  . Sciatica of right side associated with disorder of lumbosacral spine 07/21/2015  . Hypertension 02/18/2015  . Diabetes (HCC) 02/18/2015  . Gout 02/18/2015  . Hyperlipidemia 02/18/2015  . Arthritis 02/18/2015  . Encounter to establish care 02/18/2015    Beacher MayBrooks, Marie PT 02/10/2016, 2:38 PM  [facility] [facility] [address] [place], [name], [address] Phone: [phone]   Fax:  [phone]  Name: Danny Patterson MRN: [id number] Date  of Birth: Nov 27, 1945

## 2016-02-11 ENCOUNTER — Ambulatory Visit: Payer: PPO | Admitting: Physical Therapy

## 2016-02-11 ENCOUNTER — Encounter: Payer: Self-pay | Admitting: Physical Therapy

## 2016-02-11 DIAGNOSIS — R262 Difficulty in walking, not elsewhere classified: Secondary | ICD-10-CM

## 2016-02-11 DIAGNOSIS — M6281 Muscle weakness (generalized): Secondary | ICD-10-CM | POA: Diagnosis not present

## 2016-02-12 NOTE — Therapy (Signed)
[facility] [address] [place], [name], [address] Phone: [phone]   Fax:  [phone]  Physical Therapy Treatment  Patient Details  Name: Danny Patterson MRN: [id number] Date of Birth: 18-Jan-1946 Referring Provider: Karn CassisBotero, Ernesto M. MD  Encounter Date: 02/11/2016      PT End of Session - 02/11/16 1033    Visit Number 33   Number of Visits 36   Date for PT Re-Evaluation 02/13/16   Authorization Type 33   Authorization Time Period 40 (G code)   PT Start Time 1027   PT Stop Time 1110   PT Time Calculation (min) 43 min   Activity Tolerance Patient tolerated treatment well;Patient limited by fatigue   Behavior During Therapy [facility] for tasks assessed/performed      Past Medical History:  Diagnosis Date  . Allergy   . Arthritis   . Diabetes mellitus without complication (HCC)   . Gout   . Hyperlipidemia   . Hypertension   . Kidney stone   . Rheumatic fever     Past Surgical History:  Procedure Laterality Date  . BACK SURGERY    . knot     removed from neck  . POSTERIOR LUMBAR FUSION 4 LEVEL N/A 10/01/2015   Procedure: Lumbar three-four,  Lumbar four-five,  Lumbar five-Sacrum one posterior lumbar interbody fusion with Laminotomy at Lumbar Two-Three;  Surgeon: Hilda LiasErnesto Botero, MD;  Location: [facility];  Service: Neurosurgery;  Laterality: N/A;    There were no vitals filed for this visit.      Subjective Assessment - 02/11/16 1028    Subjective Back is sore today and he would like the moist heat to help with stiffness.    Limitations Lifting;Standing;Walking;House hold activities;Other (comment)   Patient Stated Goals get back to full function for work and be able to walk without AD   Currently in Pain? Yes   Pain Score 3    Pain Location Back   Pain Orientation Right;Lower   Pain Descriptors / Indicators Aching;Other (Comment)  stiffness   Pain Type Acute pain;Surgical pain  10/01/2015   Pain Onset More than a month ago   Pain Frequency Intermittent      Objective: Gait:  forward flexion at hips, decreased trunk rotation, guarded posture  Treatment: Therapeutic exercise: patient performed exercises with verbal, tactile cues anddemonstration of therapist: Sitting: Moist heat applied to lower back with patient in chair while performing sitting exercises x 10 min: no adverse reactions noted  Wobble board for Ankle DF/PF x 2 min. Each direction Hip adduction with ball andglute sets 3 x 5 alternating with hip abduction with manual resistance 3 x 5 reps Knee extension with4# 1 x 15 reps each LE Knee flexion with blueresistive band 1x 25 reps Sitting/standing:  single arm row with double black resistive band x 15, Double arm scapular retraction x 15 reps with single black resistive band.  Side stepping along airex balance beam x 2min Lateral step up with placing opposite foot in front of the other on airex balance beam x 10 reps each LE Sit to stand from high table with ball between knees and concentrate on putting weight through heels 3 x 5 reps Sitting slouch/correct with tactile cues to engage trunk extensors x 10 reps Manual STM along right side paraspinal muscles with patient seated on treatment table, in conjunction with exercises  Patient response to treatment: Patient demonstrated improved technique with exercises with minimal VC for correct  alignment and improved motor control with repetition. Patient with decreased spasms by 50% following STM. Patient with decreased soreness following moist heat with no adverse reactions noted. Improved posture with decreased guarding at end of session        PT Education - 02/11/16 1032    Education provided Yes   Education Details Posture for extension of trunk in sitting   Person(s) Educated Patient   Methods Explanation;Demonstration;Verbal cues   Comprehension Verbalized understanding;Returned  demonstration;Verbal cues required             PT Long Term Goals - 02/04/16 1114      PT LONG TERM GOAL #1   Title Patient will be able to walk safely, erect posture with appropriate AD on level surfaces >600' and stairs by 01/16/2016    Baseline 01/12/16 walking without AD, unable to perform 6 mi. walk due to right hip pain with any walking 02/05/2016 800' without AD  (limited to 4 min. due to fatigue, lower back pain)   Status Achieved     PT LONG TERM GOAL #2   Title Patient will demonstrate improved function for daily activities with mild pain as indicated by modified oswestry score of 30% or less by 02/13/2016   Baseline MODI 62% (severe self perceived disability); MODI 12/05/15 50%; 01/12/2016 = 48%   Status On-going     PT LONG TERM GOAL #3   Title Patient will demonstrate functional community ambulation and low fall rist by 10MW of 10 seconds or less by 02/13/2016   Baseline 12/09/15 11.5 seconds; 01/12/2016 deferred; 02/05/16 12.25 seconds     PT LONG TERM GOAL #4   Title Patient will be independent with home program for exercises and self management by 02/13/2016 to continue with progressive healing once discharged from physical therapy    Baseline 01/12/16 limited knowledge of appropriate exercises and progression and requires assistance for performing exercises and progressing intensity appropriately   Status On-going               Plan - 02/11/16 1113    Clinical Impression Statement Patient responded well to moist heat with decreased soreness in lower back anad was able to perform exercies with improved posture, strength, endurance with minimal cuing, demonstrating good carry over between sessions. He continues with weakness in core, LE's and should continue to improve with additional physical therapy intervention.    Rehab Potential Good   PT Frequency 3x / week   PT Duration 8 weeks   PT Treatment/Interventions Patient/family education;Gait  training;Cryotherapy;Therapeutic activities;Therapeutic exercise;Neuromuscular re-education;Scar mobilization;Manual techniques;Moist Heat   PT Next Visit Plan progress exercises, core control, gait training, weight shifting   PT Home Exercise Plan core control, LE exercises, balance/walking activities      Patient will benefit from skilled therapeutic intervention in order to improve the following deficits and impairments:  Decreased strength, Decreased activity tolerance, Decreased knowledge of precautions, Pain, Decreased endurance, Decreased range of motion, Difficulty walking, Increased muscle spasms  Visit Diagnosis: Muscle weakness (generalized)  Difficulty in walking, not elsewhere classified     Problem List Patient Active Problem List   Diagnosis Date Noted  . Cellulitis 11/27/2015  . Pedal edema 11/27/2015  . Adjustment disorder with depressed mood   . Osteoarthritis of spine with radiculopathy, lumbar region 10/15/2015  . Abnormal MRI   . Slurred speech   . Surgery, elective   . Loose stools   . OSA (obstructive sleep apnea)   . Acute blood loss anemia   .  Pneumonia   . Urinary retention   . Lewy body dementia   . Encephalopathy   . Altered mental status   . Acute encephalopathy 10/12/2015  . Hypokalemia 10/12/2015  . Anemia 10/12/2015  . S/P lumbar fusion 10/12/2015  . Acute respiratory failure (HCC)   . Degenerative disc disease, lumbar 10/01/2015  . Sciatica of right side associated with disorder of lumbosacral spine 07/21/2015  . Hypertension 02/18/2015  . Diabetes (HCC) 02/18/2015  . Gout 02/18/2015  . Hyperlipidemia 02/18/2015  . Arthritis 02/18/2015  . Encounter to establish care 02/18/2015    Beacher May PT 02/12/2016, 4:11 PM  [facility] [address], [place], [address] Phone: [phone]   Fax:  [phone]  Name: Danny Patterson MRN: [id number] Date of Birth:  [birth year]

## 2016-02-16 ENCOUNTER — Encounter: Payer: Self-pay | Admitting: Physical Therapy

## 2016-02-16 ENCOUNTER — Ambulatory Visit: Payer: PPO | Admitting: Physical Therapy

## 2016-02-16 DIAGNOSIS — M6281 Muscle weakness (generalized): Secondary | ICD-10-CM | POA: Diagnosis not present

## 2016-02-16 DIAGNOSIS — R262 Difficulty in walking, not elsewhere classified: Secondary | ICD-10-CM

## 2016-02-16 NOTE — Therapy (Signed)
[place] [facility] [address] [place], [address] Phone: [phone]   Fax:  [phone]  Physical Therapy Treatment  Patient Details  Name: Danny Patterson MRN: [id number] Date of Birth: [birth year] Referring Provider: Karn Cassis MD  Encounter Date: 02/16/2016      PT End of Session - 02/16/16 1057    Visit Number 34   Number of Visits 48   Date for PT Re-Evaluation 03/18/16   Authorization Type [id number]   Authorization Time Period 40 (G code)   PT Start Time 1033   PT Stop Time 1113   PT Time Calculation (min) 40 min   Activity Tolerance Patient tolerated treatment well;Patient limited by fatigue   Behavior During Therapy [name] for tasks assessed/performed      Past Medical History:  Diagnosis Date  . Allergy   . Arthritis   . Diabetes mellitus without complication (HCC)   . Gout   . Hyperlipidemia   . Hypertension   . Kidney stone   . Rheumatic fever     Past Surgical History:  Procedure Laterality Date  . BACK SURGERY    . knot     removed from neck  . POSTERIOR LUMBAR FUSION 4 LEVEL N/A 10/01/2015   Procedure: Lumbar three-four,  Lumbar four-five,  Lumbar five-Sacrum one posterior lumbar interbody fusion with Laminotomy at Lumbar Two-Three;  Surgeon: Hilda Lias, MD;  Location: [facility];  Service: Neurosurgery;  Laterality: N/A;    There were no vitals filed for this visit.      Subjective Assessment - 02/16/16 1036    Subjective Back is sore today and he would like the moist heat to help with stiffness. He has been putting up [employer].    Limitations Lifting;Standing;Walking;House hold activities;Other (comment)   Patient Stated Goals get back to full function for work and be able to walk without AD   Currently in Pain? Yes   Pain Score 3    Pain Location Back   Pain Orientation Right;Lower   Pain Descriptors / Indicators Aching   Pain Type Acute pain;Surgical pain   10/01/2015   Pain Onset More than a month ago   Pain Frequency Intermittent      Objective: Gait: mild forward flexion at hips, lateral flexed posture to left, antalgic gait with c/o left knee symptoms of feeling like jello (fluid) Strength: decreased knee extension strength bilaterally grossly 4-/5, decreased hip flexor strength bilaterally grossly 3+/5, decreased DF on right 3-/5 Outcome measure: MODI 45%  Treatment: Therapeutic exercise: patient performed exercises with verbal, tactile cues anddemonstration of therapist: Sitting: Moist heat applied to lower back with patient in chair while performing sitting exercises x 15 min: no adverse reactions noted  Wobble board for Ankle DF/PF x 2 min. Each direction Hip adduction with ball andglute sets 3 x 5 alternating with hip abduction with manual resistance 3 x 5 reps Knee extension with4# 1 x 15 reps each LE Knee flexion with blueresistive band 1x 25 reps Sitting/standing:  single arm row with 5# cable weight x 15, Double arm scapular retraction x 15 reps with 10# cable pulley Standing straight arm pull down 10# x 15 reps.  Side stepping along airex balance beam x Lateral step up with placing opposite foot in front of the other on airex balance beam x 10 reps each LE Sitting slouch/correct with tactile cues to engage trunk extensors x 10 reps Forward and backward walking with wide  base of support x 2 min. With close supervision and near bar for UE support for safety  Patient response to treatment: Patient demonstrated improved balance and more erect posture following exercises and with repetition. Close supervision required for safety during all standing exercises Patient with decreased soreness following moist heat with no adverse reactions noted.          PT Education - 02/16/16 1110    Education provided Yes   Education Details HEP; continue to work on sit to stand, forward and backward walking to improve  balance and strength   Person(s) Educated Patient   Methods Explanation;Verbal cues;Demonstration   Comprehension Verbalized understanding;Returned demonstration;Verbal cues required             PT Long Term Goals - 02/16/16 1117      PT LONG TERM GOAL #1   Title Patient will be able to walk safely, erect posture with appropriate AD on level surfaces >1000' and stairs by 03/18/2016    Baseline 02/05/2016 800' without AD  (limited to 4 min. due to fatigue, lower back pain)   Status Revised     PT LONG TERM GOAL #2   Title Patient will demonstrate improved function for daily activities with mild pain as indicated by modified oswestry score of 30% or less by 03/18/2016   Baseline MODI 62% (severe self perceived disability); MODI 12/05/15 50%; 01/12/2016 = 48%; 02/16/2016 45%   Status Revised     PT LONG TERM GOAL #3   Title Patient will demonstrate functional community ambulation and low fall rist by 10MW of 10 seconds or less by 03/18/2016   Baseline 12/09/15 11.5 seconds; 01/12/2016 deferred; 02/05/16 12.25 seconds   Status Revised     PT LONG TERM GOAL #4   Title Patient will be independent with home program for exercises and self management by 03/18/2016 to continue with progressive healing once discharged from physical therapy    Baseline 01/12/16 limited knowledge of appropriate exercises and progression and requires assistance for performing exercises and progressing intensity appropriately   Status Revised               Plan - 02/16/16 1115    Clinical Impression Statement Patient is progressing well with improving strength, posture, core control and endurance s/p multi level fusion lumbar spine. He continues with weakness in both LE's, lumbar spine musculature that limits abiltiy to walk long distances, functional community ambulation and daily chores at home. He will benefit from additional physcial therapy interveniton to achieve goasl and transition to independent  home program.    Rehab Potential Good   Clinical Impairments Affecting Rehab Potential (+) acute condition (surgery), family support, motivated (-) chronic condition of back pain/weakness prior to sx, age   PT Frequency 3x / week   PT Duration 6 weeks   PT Treatment/Interventions Patient/family education;Gait training;Cryotherapy;Therapeutic activities;Therapeutic exercise;Neuromuscular re-education;Scar mobilization;Manual techniques;Moist Heat   PT Next Visit Plan progress exercises, core control, gait training, weight shifting   PT Home Exercise Plan core control, LE exercises, balance/walking activities      Patient will benefit from skilled therapeutic intervention in order to improve the following deficits and impairments:  Decreased strength, Decreased activity tolerance, Decreased knowledge of precautions, Pain, Decreased endurance, Decreased range of motion, Difficulty walking, Increased muscle spasms  Visit Diagnosis: Muscle weakness (generalized) - Plan: PT plan of care cert/re-cert  Difficulty in walking, not elsewhere classified - Plan: PT plan of care cert/re-cert     Problem List Patient Active  Problem List   Diagnosis Date Noted  . Cellulitis 11/27/2015  . Pedal edema 11/27/2015  . Adjustment disorder with depressed mood   . Osteoarthritis of spine with radiculopathy, lumbar region 10/15/2015  . Abnormal MRI   . Slurred speech   . Surgery, elective   . Loose stools   . OSA (obstructive sleep apnea)   . Acute blood loss anemia   . Pneumonia   . Urinary retention   . Lewy body dementia   . Encephalopathy   . Altered mental status   . Acute encephalopathy 10/12/2015  . Hypokalemia 10/12/2015  . Anemia 10/12/2015  . S/P lumbar fusion 10/12/2015  . Acute respiratory failure (HCC)   . Degenerative disc disease, lumbar 10/01/2015  . Sciatica of right side associated with disorder of lumbosacral spine 07/21/2015  . Hypertension 02/18/2015  . Diabetes (HCC)  02/18/2015  . Gout 02/18/2015  . Hyperlipidemia 02/18/2015  . Arthritis 02/18/2015  . Encounter to establish care 02/18/2015    Beacher MayBrooks, Marie PT 02/16/2016, 6:53 PM  [facility] [address] [place], [name], [address] Phone: [phone]   Fax:  [phone]  Name: Danny Patterson MRN: [id number] Date of Birth: 1945/08/15

## 2016-02-18 ENCOUNTER — Ambulatory Visit: Payer: PPO | Admitting: Physical Therapy

## 2016-02-18 ENCOUNTER — Encounter: Payer: Self-pay | Admitting: Physical Therapy

## 2016-02-18 DIAGNOSIS — M6281 Muscle weakness (generalized): Secondary | ICD-10-CM | POA: Diagnosis not present

## 2016-02-18 DIAGNOSIS — R262 Difficulty in walking, not elsewhere classified: Secondary | ICD-10-CM

## 2016-02-18 NOTE — Therapy (Signed)
[place] [facility] [address] [place], [address] Phone: [phone]   Fax:  [phone]  Physical Therapy Treatment  Patient Details  Name: Danny Patterson MRN: [id number] Date of Birth: 10-30-1945 Referring Provider: Karn Cassis MD  Encounter Date: 02/18/2016      PT End of Session - 02/18/16 1037    Visit Number 35   Number of Visits 48   Date for PT Re-Evaluation 03/18/16   Authorization Type 35   Authorization Time Period 40 (G code)   PT Start Time 1028   PT Stop Time 1111   PT Time Calculation (min) 43 min   Activity Tolerance Patient tolerated treatment well;Patient limited by fatigue   Behavior During Therapy [facility] for tasks assessed/performed      Past Medical History:  Diagnosis Date  . Allergy   . Arthritis   . Diabetes mellitus without complication (HCC)   . Gout   . Hyperlipidemia   . Hypertension   . Kidney stone   . Rheumatic fever     Past Surgical History:  Procedure Laterality Date  . BACK SURGERY    . knot     removed from neck  . POSTERIOR LUMBAR FUSION 4 LEVEL N/A 10/01/2015   Procedure: Lumbar three-four,  Lumbar four-five,  Lumbar five-Sacrum one posterior lumbar interbody fusion with Laminotomy at Lumbar Two-Three;  Surgeon: Hilda Lias, MD;  Location: [facility];  Service: Neurosurgery;  Laterality: N/A;    There were no vitals filed for this visit.      Subjective Assessment - 02/18/16 1033    Subjective Patient reports back is improving with decreasing soreness and still gets tired with prolonged activity, standing and walking.   Limitations Lifting;Standing;Walking;House hold activities;Other (comment)   Patient Stated Goals get back to full function for work and be able to walk without AD   Currently in Pain? Yes   Pain Score 3    Pain Location Back   Pain Orientation Right;Lower;Left   Pain Descriptors / Indicators Aching;Sore   Pain Type Acute  pain;Surgical pain  10/01/2015   Pain Onset More than a month ago   Pain Frequency Intermittent      Objective: Gait: mild forward flexion at hips, improved posture with more erect trunk, and extension at hips   Treatment: Therapeutic exercise: patient performed exercises with verbal, tactile cues anddemonstration of therapist: Sitting: Moist heat applied to lower back with patient in chair while performing sitting exercises x 15 min: no adverse reactions noted  Wobble board for Ankle DF/PF x 2 min. Each direction Hip adduction with ball andglute sets 3 x 5 alternating with hip abduction with manual resistance 3 x 5 reps Knee extension with4# 1x 15 reps each LE Knee flexion with blueresistive band 1x 25 reps Sitting/standing:  single arm row with 5# cable weight x 15, Double arm scapular retraction x 15 reps with 10# cable pulley Standing straight arm pull down 10# x 15 reps.  Side stepping along airex balance beam x Step ups onto balance stones x 10 reps each LE leading with UE support on counter/chair for safety and close supervision of therapist Lateral step up with placing opposite foot in front of the other on airex balance beam x 10 reps each LE Forward and backward walking with wide base of support x 2 min. With close supervision and near bar for UE support for safety  Patient response to treatment: Patient demonstrated improved  endurance with exercises with minimal short rest periods during session. Improved motor control with guidance and repetition for core at cable machine. No increased pain reported in back and mild fatigue reported following repetitions of exercises.  Close supervision required for safety during all standing exercises Patient with decreased soreness following moist heat with no adverse reactions noted.        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 due to fatigue, lower back pain)   Status Revised     PT LONG TERM GOAL #2   Title Patient will demonstrate improved function for daily activities with mild pain as indicated by modified oswestry score of 30% or less by 03/18/2016   Baseline MODI 62% (severe self perceived disability); MODI 12/05/15 50%; 01/12/2016 = 48%; 02/16/2016 45%   Status Revised     PT LONG TERM GOAL #3   Title Patient will demonstrate functional community ambulation and low fall rist by 10MW of 10 seconds or less by 03/18/2016   Baseline 12/09/15 11.5 seconds; 01/12/2016 deferred; 02/05/16 12.25 seconds   Status Revised     PT LONG TERM GOAL #4   Title Patient will be independent with home program for exercises and self management by 03/18/2016 to continue with progressive healing once discharged from physical therapy    Baseline 01/12/16 limited knowledge of appropriate exercises and progression and requires assistance for performing exercises and progressing intensity appropriately   Status Revised               Plan - 02/18/16 1241    Clinical Impression Statement Patient is progressing well with improving strength and core control with guided exercises and verbal, tactile cues. He continues with decreased strength in LE's, core and requires instruction to perform exercises appropriately and safely and will require additional physical therapy intervention in order to be able to transition to independent home program and have maximal functional return of strength/balance s/p surgery.    Rehab  Potential Good   PT Frequency 3x / week   PT Duration 6 weeks   PT Treatment/Interventions Patient/family education;Gait training;Cryotherapy;Therapeutic activities;Therapeutic exercise;Neuromuscular re-education;Scar mobilization;Manual techniques;Moist Heat   PT Home Exercise Plan core control, LE exercises, balance/walking activities      Patient will benefit from skilled therapeutic intervention in order to improve the following deficits and impairments:  Decreased strength, Decreased activity tolerance, Decreased knowledge of precautions, Pain, Decreased endurance, Decreased range of motion, Difficulty walking, Increased muscle spasms  Visit Diagnosis: Muscle weakness (generalized)  Difficulty in walking, not elsewhere classified     Problem List Patient Active Problem List   Diagnosis Date Noted  . Cellulitis 11/27/2015  . Pedal edema 11/27/2015  . Adjustment disorder with depressed mood   . Osteoarthritis of spine with radiculopathy, lumbar region 10/15/2015  . Abnormal MRI   . Slurred speech   . Surgery, elective   . Loose stools   . OSA (obstructive sleep apnea)   .  Acute blood loss anemia   . Pneumonia   . Urinary retention   . Lewy body dementia   . Encephalopathy   . Altered mental status   . Acute encephalopathy 10/12/2015  . Hypokalemia 10/12/2015  . Anemia 10/12/2015  . S/P lumbar fusion 10/12/2015  . Acute respiratory failure (HCC)   . Degenerative disc disease, lumbar 10/01/2015  . Sciatica of right side associated with disorder of lumbosacral spine 07/21/2015  . Hypertension 02/18/2015  . Diabetes (HCC) 02/18/2015  . Gout 02/18/2015  . Hyperlipidemia 02/18/2015  . Arthritis 02/18/2015  . Encounter to establish care 02/18/2015    Beacher MayBrooks, Evita Merida PT 02/18/2016, 12:47 PM  [place] [facility] [address] [place], [name], [address] Phone: [phone]   Fax:  [phone]  Name: Rupert StacksGary L  Todisco MRN: [id number] Date of Birth: [birth year]

## 2016-02-20 ENCOUNTER — Ambulatory Visit: Payer: PPO | Attending: Neurosurgery | Admitting: Physical Therapy

## 2016-02-20 ENCOUNTER — Encounter: Payer: Self-pay | Admitting: Physical Therapy

## 2016-02-20 DIAGNOSIS — M25561 Pain in right knee: Secondary | ICD-10-CM | POA: Diagnosis not present

## 2016-02-20 DIAGNOSIS — R262 Difficulty in walking, not elsewhere classified: Secondary | ICD-10-CM

## 2016-02-20 DIAGNOSIS — M6281 Muscle weakness (generalized): Secondary | ICD-10-CM | POA: Diagnosis not present

## 2016-02-20 DIAGNOSIS — M25562 Pain in left knee: Secondary | ICD-10-CM | POA: Diagnosis not present

## 2016-02-20 NOTE — Therapy (Signed)
[place] [facility] [address] [place], [name], [address] Phone: [phone]   Fax:  [phone]  Physical Therapy Treatment  Patient Details  Name: Danny Patterson MRN: [id number] Date of Birth: Aug 01, 1945 Referring Provider: Karn CassisBotero, Ernesto M. MD  Encounter Date: 02/20/2016      PT End of Session - 02/20/16 1009    Visit Number 36   Number of Visits 48   Date for PT Re-Evaluation 03/18/16   Authorization Type 36   Authorization Time Period 40 (G code)   PT Start Time 0955   PT Stop Time 1040   PT Time Calculation (min) 45 min   Activity Tolerance Patient tolerated treatment well;Patient limited by fatigue   Behavior During Therapy [facility] for tasks assessed/performed      Past Medical History:  Diagnosis Date  . Allergy   . Arthritis   . Diabetes mellitus without complication (HCC)   . Gout   . Hyperlipidemia   . Hypertension   . Kidney stone   . Rheumatic fever     Past Surgical History:  Procedure Laterality Date  . BACK SURGERY    . knot     removed from neck  . POSTERIOR LUMBAR FUSION 4 LEVEL N/A 10/01/2015   Procedure: Lumbar three-four,  Lumbar four-five,  Lumbar five-Sacrum one posterior lumbar interbody fusion with Laminotomy at Lumbar Two-Three;  Surgeon: Hilda LiasErnesto Botero, MD;  Location: [facility];  Service: Neurosurgery;  Laterality: N/A;    There were no vitals filed for this visit.      Subjective Assessment - 02/20/16 1005    Subjective Patient reports mild soreness right hip, otherwise he is feeling well.   Limitations Lifting;Standing;Walking;House hold activities;Other (comment)   Patient Stated Goals get back to full function for work and be able to walk without AD   Currently in Pain? Yes   Pain Score 2    Pain Location Hip   Pain Orientation Right;Lower   Pain Descriptors / Indicators Aching;Sore   Pain Type Acute pain;Surgical pain  10/01/2015   Pain Onset More than a month ago    Pain Frequency Intermittent     Objective: Gait: more erect posture and improved cadence on arrival to therapy Observation: left LE atrophy of quadriceps as compared to right, decreased Active DF right ankle  Treatment: Modalities: Electrical stimulation: russian stim. 10/10 cycle applied (2) electrodes to each: left quadriceps and right anterior tibialis with intensity to tolerance/contraction with patient performing active quad sets/ankle DF with each cycle: goal: neuromuscular re education  Therapeutic exercise: patient performed with VC, tactile cues and demonstration of therapist as needed (close supervision of therapist foe all standing exercises for safety) Rocker board for weight shift/ankle DF/PF x 2 min. Each Side step along balance beam x 2 min. Lateral step ups onto balance beam x 15 reps each LE with tap into tandem stance (at counter for balance/safetly with close supervision of therapist) Step ups onto balance stones x 10 each LE leading with UE as needed at counter for safety Lateral step up/downs with wide stance onto balance stones x 15 reps Side step along airex balance beam x 2 min. With UE support on counter for safety Standing row single arm 5# right x 15, 10# left UE and double row with 10# x 15 At door frame: scapular retraction with pillow behind head and controlled motion x 10 reps with VC for correct alignment/positioning of feet/head   Patient response to  treatment: patient demonstrated improved technique with exercises with minimal VC for correct alignment with close supervision for safety with all standing exercises. Improved motor control with repetition and cuing, following estim. With reports of left LE feeling stronger.          PT Education - 02/20/16 1008    Education provided Yes   Education Details educated in use of electrical stimulation for muscle re educaiton for left quadriceps and right ankle DF prior to application   Person(s) Educated  Patient   Methods Explanation   Comprehension Verbalized understanding             PT Long Term Goals - 02/16/16 1117      PT LONG TERM GOAL #1   Title Patient will be able to walk safely, erect posture with appropriate AD on level surfaces >1000' and stairs by 03/18/2016    Baseline 02/05/2016 800' without AD  (limited to 4 min. due to fatigue, lower back pain)   Status Revised     PT LONG TERM GOAL #2   Title Patient will demonstrate improved function for daily activities with mild pain as indicated by modified oswestry score of 30% or less by 03/18/2016   Baseline MODI 62% (severe self perceived disability); MODI 12/05/15 50%; 01/12/2016 = 48%; 02/16/2016 45%   Status Revised     PT LONG TERM GOAL #3   Title Patient will demonstrate functional community ambulation and low fall rist by of 10 seconds or less by 03/18/2016   Baseline 12/09/15 11.5 seconds; 01/12/2016 deferred; 02/05/16 12.25 seconds   Status Revised     PT LONG TERM GOAL #4   Title Patient will be independent with home program for exercises and self management by 03/18/2016 to continue with progressive healing once discharged from physical therapy    Baseline 01/12/16 limited knowledge of appropriate exercises and progression and requires assistance for performing exercises and progressing intensity appropriately   Status Revised               Plan - 02/20/16 1010    Clinical Impression Statement Patient responded well to electrical stimulation with limited contractions noted indicating significant weakness in muscles. He should continue to improve with additional treatment as he heals from surgery.    Rehab Potential Good   PT Frequency 3x / week   PT Duration 6 weeks   PT Treatment/Interventions Patient/family education;Gait training;Cryotherapy;Therapeutic activities;Therapeutic exercise;Neuromuscular re-education;Scar mobilization;Manual techniques;Moist Heat;Electrical Stimulation   PT Next  Visit Plan progress exercises, core control, gait training, weight shifting   PT Home Exercise Plan core control, LE exercises, balance/walking activities      Patient will benefit from skilled therapeutic intervention in order to improve the following deficits and impairments:  Decreased strength, Decreased activity tolerance, Decreased knowledge of precautions, Pain, Decreased endurance, Decreased range of motion, Difficulty walking, Increased muscle spasms  Visit Diagnosis: Muscle weakness (generalized)  Difficulty in walking, not elsewhere classified     Problem List Patient Active Problem List   Diagnosis Date Noted  . Cellulitis 11/27/2015  . Pedal edema 11/27/2015  . Adjustment disorder with depressed mood   . Osteoarthritis of spine with radiculopathy, lumbar region 10/15/2015  . Abnormal MRI   . Slurred speech   . Surgery, elective   . Loose stools   . OSA (obstructive sleep apnea)   . Acute blood loss anemia   . Pneumonia   . Urinary retention   . Lewy body dementia   . Encephalopathy   .  Altered mental status   . Acute encephalopathy 10/12/2015  . Hypokalemia 10/12/2015  . Anemia 10/12/2015  . S/P lumbar fusion 10/12/2015  . Acute respiratory failure (HCC)   . Degenerative disc disease, lumbar 10/01/2015  . Sciatica of right side associated with disorder of lumbosacral spine 07/21/2015  . Hypertension 02/18/2015  . Diabetes (HCC) 02/18/2015  . Gout 02/18/2015  . Hyperlipidemia 02/18/2015  . Arthritis 02/18/2015  . Encounter to establish care 02/18/2015    Beacher MayBrooks, Judene Logue PT 02/20/2016, 11:04 AM  [place] [facility] [address] [place], [name], [address] Phone: [phone]   Fax:  [phone]  Name: Danny Patterson MRN: [id number] Date of Birth: [birth year]

## 2016-02-23 ENCOUNTER — Encounter: Payer: Self-pay | Admitting: Physical Therapy

## 2016-02-23 ENCOUNTER — Ambulatory Visit: Payer: PPO | Admitting: Physical Therapy

## 2016-02-23 DIAGNOSIS — M6281 Muscle weakness (generalized): Secondary | ICD-10-CM | POA: Diagnosis not present

## 2016-02-23 DIAGNOSIS — R262 Difficulty in walking, not elsewhere classified: Secondary | ICD-10-CM

## 2016-02-23 DIAGNOSIS — M25561 Pain in right knee: Secondary | ICD-10-CM

## 2016-02-23 DIAGNOSIS — M25562 Pain in left knee: Secondary | ICD-10-CM

## 2016-02-23 NOTE — Therapy (Signed)
[place] [facility] [address] [place], [name], [address] Phone: [phone]   Fax:  [phone]  Physical Therapy Treatment  Patient Details  Name: Danny Patterson MRN: [id number] Date of Birth: 1945-12-30 Referring Provider: Karn CassisBotero, Ernesto M. MD  Encounter Date: 02/23/2016      PT End of Session - 02/23/16 0841    Visit Number 37   Number of Visits 48   Date for PT Re-Evaluation 03/18/16   Authorization Type 37   Authorization Time Period 40 (G code)   PT Start Time 0826   PT Stop Time 0916   PT Time Calculation (min) 50 min   Activity Tolerance Patient tolerated treatment well;Patient limited by fatigue;Patient limited by pain   Behavior [facility] Therapy [facility] for tasks assessed/performed      Past Medical History:  Diagnosis Date  . Allergy   . Arthritis   . Diabetes mellitus without complication (HCC)   . Gout   . Hyperlipidemia   . Hypertension   . Kidney stone   . Rheumatic fever     Past Surgical History:  Procedure Laterality Date  . BACK SURGERY    . knot     removed from neck  . POSTERIOR LUMBAR FUSION 4 LEVEL N/A 10/01/2015   Procedure: Lumbar three-four,  Lumbar four-five,  Lumbar five-Sacrum one posterior lumbar interbody fusion with Laminotomy at Lumbar Two-Three;  Surgeon: Hilda LiasErnesto Botero, MD;  Location: [facility];  Service: Neurosurgery;  Laterality: N/A;    There were no vitals filed for this visit.      Subjective Assessment - 02/23/16 0838    Subjective Patient with increased pain in left knee today and soreness from doing a lot around the home for holidays.    Limitations Lifting;Standing;Walking;House hold activities;Other (comment)   Patient Stated Goals get back to full function for work and be able to walk without AD   Currently in Pain? Yes   Pain Score 4    Pain Location Knee   Pain Orientation Left;Right   Pain Descriptors / Indicators Aching;Sore   Pain Type Acute  pain;Surgical pain  10/01/2015   Pain Onset More than a month ago   Pain Frequency Intermittent      Objective: Gait: more erect posture and improved cadence on arrival to therapy Observation: left LE atrophy of quadriceps as compared to right, decreased Active DF right ankle  Treatment: Modalities: Electrical stimulation: russian stim. 10/10 cycle applied (2) electrodes: left quadriceps and high volt to medial/lateral aspect left knee with intensity to tolerance/contraction with patient performing active quad sets with each cycle: goal: neuromuscular re education/pain Moist heat applied to lower back and left knee during estim. Treatment: no adverse effects noted.  Therapeutic exercise: patient performed with VC, tactile cues and demonstration of therapist as needed (close supervision of therapist foe all standing exercises for safety) Rocker board for weight shift/ankle DF/PF x 2 min. Each Seated hip adduction with glute sets with ball x 15 reps hold 3-5 seconds each Hip abduction/ER with resistive band in sitting with manual resistance at end range x 15 reps, then 25 reps with blue resistive band Side step along balance beam x 2 min.at counter for UE support Seated assisted single arm (right only) row single arm with blur and black resistive band Double resistive rows (blue and black resistive tubing) x 20 Straight arm pull downs with black resistive tubing x 20  Patient response to treatment: Patient demonstrated improved technique  with exercise with assistance, VC and close supervision for standing exercise. He reported decreased pain in left knee to mild following heat/estim. Modified exercises to avoid exacerbation of symptoms in left knee with good results.patient verbalized good understanding of modifications of home program for today and tomorrow to decrease knee symptoms.         PT Education - 02/23/16 [id number]    Education provided Yes   Education Details HEP: modified  exercises for LE's to decreased strain on knees: chair exercises vs standing    Person(s) Educated Patient   Methods Explanation;Demonstration;Verbal cues   Comprehension Verbalized understanding;Returned demonstration;Verbal cues required             PT Long Term Goals - 02/16/16 1117      PT LONG TERM GOAL #1   Title Patient will be able to walk safely, erect posture with appropriate AD on level surfaces >1000' and stairs by 03/18/2016    Baseline 02/05/2016 800' without AD  (limited to 4 min. due to fatigue, lower back pain)   Status Revised     PT LONG TERM GOAL #2   Title Patient will demonstrate improved function for daily activities with mild pain as indicated by modified oswestry score of 30% or less by 03/18/2016   Baseline MODI 62% (severe self perceived disability); MODI 12/05/15 50%; 01/12/2016 = 48%; 02/16/2016 45%   Status Revised     PT LONG TERM GOAL #3   Title Patient will demonstrate functional community ambulation and low fall rist by 10MW of 10 seconds or less by 03/18/2016   Baseline 12/09/15 11.5 seconds; 01/12/2016 deferred; 02/05/16 12.25 seconds   Status Revised     PT LONG TERM GOAL #4   Title Patient will be independent with home program for exercises and self management by 03/18/2016 to continue with progressive healing once discharged from physical therapy    Baseline 01/12/16 limited knowledge of appropriate exercises and progression and requires assistance for performing exercises and progressing intensity appropriately   Status Revised               Plan - 02/23/16 0841    Clinical Impression Statement Patient's pain limits exercises performed today. He is progressing steadily with exercises and should continue with additional treatment including estim. for left LE atrophy and knee pain.    Rehab Potential Good   PT Frequency 3x / week   PT Duration 6 weeks   PT Treatment/Interventions Patient/family education;Gait  training;Cryotherapy;Therapeutic activities;Therapeutic exercise;Neuromuscular re-education;Scar mobilization;Manual techniques;Moist Heat;Electrical Stimulation   PT Next Visit Plan progress exercises, core control, gait training, weight shifting   PT Home Exercise Plan core control, LE exercises, balance/walking activities      Patient will benefit from skilled therapeutic intervention in order to improve the following deficits and impairments:  Decreased strength, Decreased activity tolerance, Decreased knowledge of precautions, Pain, Decreased endurance, Decreased range of motion, Difficulty walking, Increased muscle spasms  Visit Diagnosis: Muscle weakness (generalized)  Difficulty in walking, not elsewhere classified  Pain in both knees, unspecified chronicity     Problem List Patient Active Problem List   Diagnosis Date Noted  . Cellulitis 11/27/2015  . Pedal edema 11/27/2015  . Adjustment disorder with depressed mood   . Osteoarthritis of spine with radiculopathy, lumbar region 10/15/2015  . Abnormal MRI   . Slurred speech   . Surgery, elective   . Loose stools   . OSA (obstructive sleep apnea)   . Acute blood loss anemia   . Pneumonia   .  Urinary retention   . Lewy body dementia   . Encephalopathy   . Altered mental status   . Acute encephalopathy 10/12/2015  . Hypokalemia 10/12/2015  . Anemia 10/12/2015  . S/P lumbar fusion 10/12/2015  . Acute respiratory failure (HCC)   . Degenerative disc disease, lumbar 10/01/2015  . Sciatica of right side associated with disorder of lumbosacral spine 07/21/2015  . Hypertension 02/18/2015  . Diabetes (HCC) 02/18/2015  . Gout 02/18/2015  . Hyperlipidemia 02/18/2015  . Arthritis 02/18/2015  . Encounter to establish care 02/18/2015    Beacher May PT 02/23/2016, 9:32 AM  [place] [facility]IC[facility] [address], [place], [address] Phone: [phone]   Fax:   [phone]  Name: Danny Patterson MRN: [id number] Date of Birth: 06-01-1945

## 2016-02-25 ENCOUNTER — Ambulatory Visit: Payer: PPO | Admitting: Physical Therapy

## 2016-02-25 DIAGNOSIS — M25561 Pain in right knee: Secondary | ICD-10-CM

## 2016-02-25 DIAGNOSIS — R262 Difficulty in walking, not elsewhere classified: Secondary | ICD-10-CM

## 2016-02-25 DIAGNOSIS — M25562 Pain in left knee: Secondary | ICD-10-CM

## 2016-02-25 DIAGNOSIS — M6281 Muscle weakness (generalized): Secondary | ICD-10-CM

## 2016-02-26 NOTE — Therapy (Signed)
[facility] [address] [place], [name], [address] Phone: [phone]   Fax:  [phone]  Physical Therapy Treatment  Patient Details  Name: Danny Patterson MRN: [id number] Date of Birth: 05/01/1945 Referring Provider: Karn CassisBotero, Ernesto M. MD  Encounter Date: 02/25/2016      PT End of Session - 02/25/16 0943    Visit Number 38   Number of Visits 48   Date for PT Re-Evaluation 03/18/16   Authorization Type 38   Authorization Time Period 40 (G code)   PT Start Time 0853   PT Stop Time 0935   PT Time Calculation (min) 42 min   Activity Tolerance Fatigue, tolerated treatment well swelling in LE's limit standing exercises   Behavior [facility] for tasks assessed/performed      Past Medical History:  Diagnosis Date  . Allergy   . Arthritis   . Diabetes mellitus without complication (HCC)   . Gout   . Hyperlipidemia   . Hypertension   . Kidney stone   . Rheumatic fever     Past Surgical History:  Procedure Laterality Date  . BACK SURGERY    . knot     removed from neck  . POSTERIOR LUMBAR FUSION 4 LEVEL N/A 10/01/2015   Procedure: Lumbar three-four,  Lumbar four-five,  Lumbar five-Sacrum one posterior lumbar interbody fusion with Laminotomy at Lumbar Two-Three;  Surgeon: Hilda LiasErnesto Botero, MD;  Location: [facility];  Service: Neurosurgery;  Laterality: N/A;    There were no vitals filed for this visit.      Subjective Assessment - 02/25/16 0857    Subjective Patient with increased left knee pain, swelling and both LE's with increased swelling. He reports he has been up and sitting more yesterday doing exercises on rocker board and this may have contributed to the swelling.   Limitations Lifting;Standing;Walking;House hold activities;Other (comment)   Patient Stated Goals get back to full function for work and be able to walk without AD   Currently in Pain? Yes   Pain Score 4    Pain Location  Back  and left knee   Pain Orientation Left;Lower   Pain Descriptors / Indicators Aching   Pain Type Acute pain;Surgical pain  10/01/2015   Pain Onset More than a month ago   Pain Frequency Intermittent      Objective: Gait: forward flexed at hips with decreased cadence and antalgic gait with limp to left Observation: left LE atrophy of quadriceps as compared to right, incresaed swelling both LE's, left>right knee to ankle, non pitting  Treatment: Modalities: Electrical stimulation x 20 min.: russian stim. 10/10 cycle applied (2) electrodes: left quadriceps and high volt to medial/lateral aspect left knee and russian stim. To right anterior tibialis  with intensity to tolerance/contraction with patient seated in chair with LE's elevated and performing active quad sets with each cycle: goal: neuromuscular re education/pain Moist heat applied to lower back and left knee during estim. Treatment: no adverse effects noted.  Therapeutic exercise: patient performed with VC, tactile cues and demonstration of therapist as needed  Rocker board for weight shift/ankle DF/PF x 2 min. Each Seated assisted single arm (right only) row single arm with black resistive band x 15 Double resistive rows (black resistive tubing) x 20 Straight arm pull downs with black resistive tubing x 20  Patient response to treatment: Patient demonstrated improved posture and control for core exercises with minimal VC for correct alignment. decreased swelling to  mild in left LE with decreased pain in knee with weight bearing to mild following treatment. Modified exercises to UE only due to left knee pain/swelling.        PT Education - 02/25/16 0902    Education provided Yes   Education Details HEP: the importance of resting and elevating LE's to assist with swelling and pain following extra activity such as holiday decorating   Person(s) Educated Patient   Methods Explanation   Comprehension Verbalized  understanding             PT Long Term Goals - 02/16/16 1117      PT LONG TERM GOAL #1   Title Patient will be able to walk safely, erect posture with appropriate AD on level surfaces >1000' and stairs by 03/18/2016    Baseline 02/05/2016 800' without AD  (limited to 4 min. due to fatigue, lower back pain)   Status Revised     PT LONG TERM GOAL #2   Title Patient will demonstrate improved function for daily activities with mild pain as indicated by modified oswestry score of 30% or less by 03/18/2016   Baseline MODI 62% (severe self perceived disability); MODI 12/05/15 50%; 01/12/2016 = 48%; 02/16/2016 45%   Status Revised     PT LONG TERM GOAL #3   Title Patient will demonstrate functional community ambulation and low fall rist by of 10 seconds or less by 03/18/2016   Baseline 12/09/15 11.5 seconds; 01/12/2016 deferred; 02/05/16 12.25 seconds   Status Revised     PT LONG TERM GOAL #4   Title Patient will be independent with home program for exercises and self management by 03/18/2016 to continue with progressive healing once discharged from physical therapy    Baseline 01/12/16 limited knowledge of appropriate exercises and progression and requires assistance for performing exercises and progressing intensity appropriately   Status Revised               Plan - 02/25/16 0945    Clinical Impression Statement Modified exercises today to assist with decreasing swelling in LE's with good results of decreased swelling, pain in left LE and able to perform exercises for core involving UE's and sitting activities. Patient with imrpoved gait pattern and less pain in knee at end of session indicating good results from treatment. He is improving knowledge of self management of symptoms and exercises as he continues with physical therapy intervention.    Rehab Potential Good   PT Frequency 3x / week   PT Duration 6 weeks   PT Treatment/Interventions Patient/family education;Gait  training;Cryotherapy;Therapeutic activities;Therapeutic exercise;Neuromuscular re-education;Scar mobilization;Manual techniques;Moist Heat;Electrical Stimulation   PT Next Visit Plan progress exercises, core control, gait training, weight shifting; electrical stimulation left LE   PT Home Exercise Plan core control, LE exercises, balance/walking activities; rest for the next 1-2 days and control swelling/pain in left knee/LE      Patient will benefit from skilled therapeutic intervention in order to improve the following deficits and impairments:  Decreased strength, Decreased activity tolerance, Decreased knowledge of precautions, Pain, Decreased endurance, Decreased range of motion, Difficulty walking, Increased muscle spasms  Visit Diagnosis: Muscle weakness (generalized)  Difficulty in walking, not elsewhere classified  Pain in both knees, unspecified chronicity     Problem List Patient Active Problem List   Diagnosis Date Noted  . Cellulitis 11/27/2015  . Pedal edema 11/27/2015  . Adjustment disorder with depressed mood   . Osteoarthritis of spine with radiculopathy, lumbar region 10/15/2015  . Abnormal  MRI   . Slurred speech   . Surgery, elective   . Loose stools   . OSA (obstructive sleep apnea)   . Acute blood loss anemia   . Pneumonia   . Urinary retention   . Lewy body dementia   . Encephalopathy   . Altered mental status   . Acute encephalopathy 10/12/2015  . Hypokalemia 10/12/2015  . Anemia 10/12/2015  . S/P lumbar fusion 10/12/2015  . Acute respiratory failure (HCC)   . Degenerative disc disease, lumbar 10/01/2015  . Sciatica of right side associated with disorder of lumbosacral spine 07/21/2015  . Hypertension 02/18/2015  . Diabetes (HCC) 02/18/2015  . Gout 02/18/2015  . Hyperlipidemia 02/18/2015  . Arthritis 02/18/2015  . Encounter to establish care 02/18/2015    Beacher MayBrooks, Rennee Coyne PT 02/26/2016, 9:08 AM  [facility]  [facility] [address] [place], [name], [address] Phone: [phone]   Fax:  [phone]  Name: Danny Patterson MRN: [id number] Date of Birth: 05/03/1945

## 2016-02-27 ENCOUNTER — Ambulatory Visit: Payer: PPO | Admitting: Physical Therapy

## 2016-02-27 ENCOUNTER — Encounter: Payer: Self-pay | Admitting: Physical Therapy

## 2016-02-27 DIAGNOSIS — M6281 Muscle weakness (generalized): Secondary | ICD-10-CM | POA: Diagnosis not present

## 2016-02-27 DIAGNOSIS — R262 Difficulty in walking, not elsewhere classified: Secondary | ICD-10-CM

## 2016-02-27 NOTE — Therapy (Signed)
[place] [facility] [address] [place], [name], [address] Phone: [phone]   Fax:  [phone]  Physical Therapy Treatment  Patient Details  Name: Danny Patterson MRN: [id number] Date of Birth: 08-12-1945 Referring Provider: Karn CassisBotero, Ernesto M. MD  Encounter Date: 02/27/2016      PT End of Session - 02/27/16 1206    Visit Number 39   Number of Visits 48   Date for PT Re-Evaluation 03/18/16   Authorization Type 39   Authorization Time Period 40 (G code)   PT Start Time 1118   PT Stop Time 1155   PT Time Calculation (min) 37 min   Activity Tolerance Patient tolerated treatment well   Behavior During Therapy WFL for tasks assessed/performed      Past Medical History:  Diagnosis Date  . Allergy   . Arthritis   . Diabetes mellitus without complication (HCC)   . Gout   . Hyperlipidemia   . Hypertension   . Kidney stone   . Rheumatic fever     Past Surgical History:  Procedure Laterality Date  . BACK SURGERY    . knot     removed from neck  . POSTERIOR LUMBAR FUSION 4 LEVEL N/A 10/01/2015   Procedure: Lumbar three-four,  Lumbar four-five,  Lumbar five-Sacrum one posterior lumbar interbody fusion with Laminotomy at Lumbar Two-Three;  Surgeon: Hilda LiasErnesto Botero, MD;  Location: [facility];  Service: Neurosurgery;  Laterality: N/A;    There were no vitals filed for this visit.      Subjective Assessment - 02/27/16 1205    Subjective Feels the estim. has helped with right LE and left quadriceps and has less left knee pain today. He has been resting more and not climbing steps or outdoors as much. He is tired and agrees to only electrical stimulation today to allow continued rest until next week and will resume exercises.    Limitations Lifting;Standing;Walking;House hold activities;Other (comment)   Patient Stated Goals get back to full function for work and be able to walk without AD   Currently in Pain? No/denies       Objective: Gait: forward flexion at hips, decreased weight shifting to left LE Observation: left LE atrophy of quadriceps as compared to right, mild to no swelling in eight LE today as compared to previous session  Treatment: Modalities: Electrical stimulation x 20 min.: russian stim. 10/10 cycle applied (2) electrodes: left quadriceps and high volt to medial/lateral aspect left knee and russian stim. To right anterior tibialis  with intensity to tolerance/contraction with patient seated in chair with LE's elevated and performing active quad sets with each cycle: goal: neuromuscular re education/pain Moist heat applied to lower back and left knee during estim. Treatment: no adverse effects noted.   Patient response to treatment: Patient posture more erect with improved knee extension bilaterally following treatment and reported decreased pain in knee and back following treatment. Modified treatment to electrical stimulation only  today due to patient not feeling well and still having increased swelling in left knee.           PT Long Term Goals - 02/16/16 1117      PT LONG TERM GOAL #1   Title Patient will be able to walk safely, erect posture with appropriate AD on level surfaces >1000' and stairs by 03/18/2016    Baseline 02/05/2016 800' without AD  (limited to 4 min. due to fatigue, lower back pain)   Status Revised  PT LONG TERM GOAL #2   Title Patient will demonstrate improved function for daily activities with mild pain as indicated by modified oswestry score of 30% or less by 03/18/2016   Baseline MODI 62% (severe self perceived disability); MODI 12/05/15 50%; 01/12/2016 = 48%; 02/16/2016 45%   Status Revised     PT LONG TERM GOAL #3   Title Patient will demonstrate functional community ambulation and low fall rist by 10MW of 10 seconds or less by 03/18/2016   Baseline 12/09/15 11.5 seconds; 01/12/2016 deferred; 02/05/16 12.25 seconds   Status Revised     PT  LONG TERM GOAL #4   Title Patient will be independent with home program for exercises and self management by 03/18/2016 to continue with progressive healing once discharged from physical therapy    Baseline 01/12/16 limited knowledge of appropriate exercises and progression and requires assistance for performing exercises and progressing intensity appropriately   Status Revised               Plan - 02/27/16 1208    Clinical Impression Statement Limited session to estim. today as patient is improving strength and quad control and ankle DF control right LE. He is to begin exercises over the weekend in moderation and will re assess next week for adding more resistive exercises in clinic.    PT Frequency 3x / week   PT Duration 6 weeks   PT Treatment/Interventions Patient/family education;Gait training;Cryotherapy;Therapeutic activities;Therapeutic exercise;Neuromuscular re-education;Scar mobilization;Manual techniques;Moist Heat;Electrical Stimulation   PT Next Visit Plan progress exercises, core control, gait training, weight shifting; electrical stimulation left LE/RLE   PT Home Exercise Plan core control, LE exercises, balance/walking activities; rest for the next 1-2 days and control swelling/pain in left knee/LE      Patient will benefit from skilled therapeutic intervention in order to improve the following deficits and impairments:  Decreased strength, Decreased activity tolerance, Decreased knowledge of precautions, Pain, Decreased endurance, Decreased range of motion, Difficulty walking, Increased muscle spasms  Visit Diagnosis: Muscle weakness (generalized)  Difficulty in walking, not elsewhere classified     Problem List Patient Active Problem List   Diagnosis Date Noted  . Cellulitis 11/27/2015  . Pedal edema 11/27/2015  . Adjustment disorder with depressed mood   . Osteoarthritis of spine with radiculopathy, lumbar region 10/15/2015  . Abnormal MRI   . Slurred  speech   . Surgery, elective   . Loose stools   . OSA (obstructive sleep apnea)   . Acute blood loss anemia   . Pneumonia   . Urinary retention   . Lewy body dementia   . Encephalopathy   . Altered mental status   . Acute encephalopathy 10/12/2015  . Hypokalemia 10/12/2015  . Anemia 10/12/2015  . S/P lumbar fusion 10/12/2015  . Acute respiratory failure (HCC)   . Degenerative disc disease, lumbar 10/01/2015  . Sciatica of right side associated with disorder of lumbosacral spine 07/21/2015  . Hypertension 02/18/2015  . Diabetes (HCC) 02/18/2015  . Gout 02/18/2015  . Hyperlipidemia 02/18/2015  . Arthritis 02/18/2015  . Encounter to establish care 02/18/2015    Beacher MayBrooks, Marie PT 02/27/2016, 12:10 PM  [facility] [address] [place], [name], [address] Phone: [phone]   Fax:  [phone]  Name: Danny Patterson MRN: [id number] Date of Birth: 26-Jan-1946

## 2016-03-01 ENCOUNTER — Ambulatory Visit: Payer: PPO | Admitting: Physical Therapy

## 2016-03-01 ENCOUNTER — Encounter: Payer: Self-pay | Admitting: Physical Therapy

## 2016-03-01 DIAGNOSIS — M25561 Pain in right knee: Secondary | ICD-10-CM

## 2016-03-01 DIAGNOSIS — M6281 Muscle weakness (generalized): Secondary | ICD-10-CM | POA: Diagnosis not present

## 2016-03-01 DIAGNOSIS — M25562 Pain in left knee: Secondary | ICD-10-CM

## 2016-03-01 DIAGNOSIS — R262 Difficulty in walking, not elsewhere classified: Secondary | ICD-10-CM

## 2016-03-01 NOTE — Therapy (Signed)
[place] [facility] [address] [place], [name], [address] Phone: [phone]   Fax:  [phone]  Physical Therapy Treatment  Patient Details  Name: Danny Patterson MRN: [id number] Date of Birth: 08-24-1945 Referring Provider: Karn CassisBotero, Ernesto M. MD  Encounter Date: 03/01/2016      PT End of Session - 03/01/16 1017    Visit Number 40   Number of Visits 48   Date for PT Re-Evaluation 03/18/16   Authorization Type 40   Authorization Time Period 40 (G code)   PT Start Time 1003   PT Stop Time 1047   PT Time Calculation (min) 44 min   Activity Tolerance Patient tolerated treatment well;Patient limited by fatigue   Behavior During Therapy [facility] for tasks assessed/performed      Past Medical History:  Diagnosis Date  . Allergy   . Arthritis   . Diabetes mellitus without complication (HCC)   . Gout   . Hyperlipidemia   . Hypertension   . Kidney stone   . Rheumatic fever     Past Surgical History:  Procedure Laterality Date  . BACK SURGERY    . knot     removed from neck  . POSTERIOR LUMBAR FUSION 4 LEVEL N/A 10/01/2015   Procedure: Lumbar three-four,  Lumbar four-five,  Lumbar five-Sacrum one posterior lumbar interbody fusion with Laminotomy at Lumbar Two-Three;  Surgeon: Hilda LiasErnesto Botero, MD;  Location: [facility];  Service: Neurosurgery;  Laterality: N/A;    There were no vitals filed for this visit.      Subjective Assessment - 03/01/16 1014    Subjective Patient reports he took it easy over the weekend and rested and massaged his legs. He feels his legs are better with the estim. treatment and he is able to walk with better posture. He feels he is still ~ 50% limited in daily tasks and with walking due to weakness in left LE and right foot (drags foot at times: improving with estim?)   Limitations Lifting;Standing;Walking;House hold activities;Other (comment)   Patient Stated Goals get back to full function  for work and be able to walk without AD   Currently in Pain? No/denies      Objective: Gait: more erect posture noted on arrival as compared to previous session; trunk lean and decreased DF right ankle Observation: left LE atrophy of quadriceps as compared to right, mild swelling both LE's, improved from previous session  Treatment: Modalities: Electrical stimulation x 20 min.: russian stim. 10/10 cycle applied (2) electrodes: left quadriceps and high volt to medial/lateral aspect left knee and russian stim. To right anterior tibialis  with intensity to tolerance/contraction with patient seated in chair with LE's elevated and performing active quad sets with each cycle: goal: neuromuscular re education/pain Moist heat applied to lower back during estim. treatment: no adverse effects noted.  Therapeutic exercise: patient performed with VC, tactile cues and demonstration of therapist as needed  Rocker board for weight shift/ankle DF/PF x 2 min. Each Seated assisted single arm (right only) row single arm with black and blue resistive band x 15 Double resistive rows (black and blue resistive tubing) x 20 Straight arm pull downs with black and blue resistive tubing x 20 Seated DF/PF and weight shifting on rocker board x 2 min. Each Knee extension x 15 reps each LE SLR off edge of chair x 15 reps each with cuing and demonstration for correct technique Sitting slouch/correct x 15 reps with tactile and  VC Standing step ups onto balance stones x 10 with each LE leading with counter and close supervision for safety/balance Side step on/off balance stones x 10 reps with standby supervision and counter for balance/support for safety   Patient response to treatment: Patient demonstrated improved technique and positioning for SLR and knee extension with VC and demonstration. He improved lumbar extension recruitment with tactile cues and repetition. Patient demonstrated improved posture and control  for core exercises with minimal VC for correct alignment.        PT Education - 03/01/16 1016    Education provided Yes   Education Details HEP: re assessed home program and re inforced resting between activity at home around the house or exercises.    Person(s) Educated Patient   Methods Explanation   Comprehension Verbalized understanding             PT Long Term Goals - 03/01/16 1023      PT LONG TERM GOAL #1   Title Patient will be able to walk safely, erect posture with appropriate AD on level surfaces >1000' and stairs by 03/18/2016    Baseline 02/05/2016 800' without AD  (limited to 4 min. due to fatigue, lower back pain)   Status On-going     PT LONG TERM GOAL #2   Title Patient will demonstrate improved function for daily activities with mild pain as indicated by modified oswestry score of 30% or less by 03/18/2016   Baseline MODI 62% (severe self perceived disability); MODI 12/05/15 50%; 01/12/2016 = 48%; 02/16/2016 45%   Status On-going     PT LONG TERM GOAL #3   Title Patient will demonstrate functional community ambulation and low fall rist by 10MW of 10 seconds or less by 03/18/2016   Baseline 12/09/15 11.5 seconds; 01/12/2016 deferred; 02/05/16 12.25 seconds   Status On-going     PT LONG TERM GOAL #4   Title Patient will be independent with home program for exercises and self management by 03/18/2016 to continue with progressive healing once discharged from physical therapy    Baseline 01/12/16 limited knowledge of appropriate exercises and progression and requires assistance for performing exercises and progressing intensity appropriately   Status On-going               Plan - 03/01/16 1049    Clinical Impression Statement Patient is progressing well towards goals and continues with significant atrophy and weakness in left quadriceps that will require continued physical therapy intervention to achieve maximal gains to improve function with walking and  dialy tasks. He is progressing towards goals slowly and steadily due to weakness s/p multi level fusion of lumbar spine 09/2015.    Rehab Potential Good   PT Frequency 3x / week   PT Duration 6 weeks   PT Treatment/Interventions Patient/family education;Gait training;Cryotherapy;Therapeutic activities;Therapeutic exercise;Neuromuscular re-education;Scar mobilization;Manual techniques;Moist Heat;Electrical Stimulation   PT Next Visit Plan progress exercises, core control, gait training, weight shifting; electrical stimulation left LE/RLE   PT Home Exercise Plan core control, LE exercises, balance/walking activities; rest for the next 1-2 days and control swelling/pain in left knee/LE      Patient will benefit from skilled therapeutic intervention in order to improve the following deficits and impairments:  Decreased strength, Decreased activity tolerance, Decreased knowledge of precautions, Pain, Decreased endurance, Decreased range of motion, Difficulty walking, Increased muscle spasms  Visit Diagnosis: Muscle weakness (generalized)  Difficulty in walking, not elsewhere classified  Pain in both knees, unspecified chronicity  G-Codes - 03/01/16 1025    Functional Assessment Tool Used MODI, pain scale, ROM, strength deficits   Functional Limitation Mobility: Walking and moving around   Mobility: Walking and Moving Around Current Status [phone]) At least 40 percent but less than 60 percent impaired, limited or restricted   Mobility: Walking and Moving Around Goal Status [phone]) At least 1 percent but less than 20 percent impaired, limited or restricted      Problem List Patient Active Problem List   Diagnosis Date Noted  . Cellulitis 11/27/2015  . Pedal edema 11/27/2015  . Adjustment disorder with depressed mood   . Osteoarthritis of spine with radiculopathy, lumbar region 10/15/2015  . Abnormal MRI   . Slurred speech   . Surgery, elective   . Loose stools   . OSA  (obstructive sleep apnea)   . Acute blood loss anemia   . Pneumonia   . Urinary retention   . Lewy body dementia   . Encephalopathy   . Altered mental status   . Acute encephalopathy 10/12/2015  . Hypokalemia 10/12/2015  . Anemia 10/12/2015  . S/P lumbar fusion 10/12/2015  . Acute respiratory failure (HCC)   . Degenerative disc disease, lumbar 10/01/2015  . Sciatica of right side associated with disorder of lumbosacral spine 07/21/2015  . Hypertension 02/18/2015  . Diabetes (HCC) 02/18/2015  . Gout 02/18/2015  . Hyperlipidemia 02/18/2015  . Arthritis 02/18/2015  . Encounter to establish care 02/18/2015    Beacher May PT 03/01/2016, 5:19 PM  [place] [facility] PHYSICAL AND SPORTS MEDICINE [address] [place], [address] Phone: [phone]   Fax:  [phone]  Name: Danny Patterson MRN: [id number] Date of Birth: 08/07/1945

## 2016-03-03 ENCOUNTER — Encounter: Payer: Self-pay | Admitting: Physical Therapy

## 2016-03-03 ENCOUNTER — Ambulatory Visit: Payer: PPO | Admitting: Physical Therapy

## 2016-03-03 DIAGNOSIS — M6281 Muscle weakness (generalized): Secondary | ICD-10-CM

## 2016-03-03 DIAGNOSIS — R262 Difficulty in walking, not elsewhere classified: Secondary | ICD-10-CM

## 2016-03-04 DIAGNOSIS — M5416 Radiculopathy, lumbar region: Secondary | ICD-10-CM | POA: Diagnosis not present

## 2016-03-04 NOTE — Therapy (Signed)
[facility] PH[facility] AND SPOR[facility] MEDICINE [address] [place], [name], [address] Phone: [phone]   Fax:  [phone]  Physical Therapy Treatment  Patient Details  Name: Danny StacksGary L Dress MRN: [id number] Date of Birth: 07/14/1945 Referring Provider: Karn CassisBotero, Ernesto M. MD  Encounter Date: 03/03/2016      PT End of Session - 03/04/16 0827    Visit Number 41   Number of Visits 48   Date for PT Re-Evaluation 03/18/16   Authorization Type 41   Authorization Time Period 40 (G code)   PT Start Time 1000   PT Stop Time 1046   PT Time Calculation (min) 46 min   Activity Tolerance Patient tolerated treatment well;Patient limited by fatigue   Behavior During Therapy [facility] for tasks assessed/performed      Past Medical History:  Diagnosis Date  . Allergy   . Arthritis   . Diabetes mellitus without complication (HCC)   . Gout   . Hyperlipidemia   . Hypertension   . Kidney stone   . Rheumatic fever     Past Surgical History:  Procedure Laterality Date  . BACK SURGERY    . knot     removed from neck  . POSTERIOR LUMBAR FUSION 4 LEVEL N/A 10/01/2015   Procedure: Lumbar three-four,  Lumbar four-five,  Lumbar five-Sacrum one posterior lumbar interbody fusion with Laminotomy at Lumbar Two-Three;  Surgeon: Hilda LiasErnesto Botero, MD;  Location: [facility];  Service: Neurosurgery;  Laterality: N/A;    There were no vitals filed for this visit.      Subjective Assessment - 03/03/16 1037    Subjective Patient reports he is still having some pain in left leg quadriceps and hamstring. He is getting stronger. He continues to fatigue with prolonged standing activities (back weakness)   Limitations Lifting;Standing;Walking;House hold activities;Other (comment)   Patient Stated Goals get back to full function for work and be able to walk without AD   Currently in Pain? No/denies  some discomfort, no worse      Objective: Gait: more erect posture noted on  arrival as compared to previous session; trunk lean and decreased DF right ankle Observation: left LE atrophy of quadriceps as compared to right  Treatment: Modalities: Electrical stimulation x 20 min.: russian stim. 10/10 cycle applied (2) electrodes: left quadriceps and high volt to medial/lateral aspect left knee and russian stim. To right anterior tibialis with intensity to tolerance/contraction with patient seated in chair with LE's elevated andperforming active quad sets with each cycle: goal: neuromuscular re education/pain Moist heat applied to lower back and left knee during estim. treatment: no adverse effects noted.  Therapeutic exercise: patient performed with VC, tactile cues and demonstration of therapist as needed  Rocker board for weight shift/ankle DF/PF x 2 min. Each Seated assisted single arm (right only) row single arm with black and blue resistive band x 15 Double resistive rows (black and blue resistive tubing) x 20 Straight arm pull downs with black and blue resistive tubing x 20 Knee extension x 15 reps each LE Knee flexion with blue resistive band 2 x 15 reps SLR off edge of chair 3 x 5 reps each with cuing and demonstration for correct technique Sitting slouch/correct x 15 reps with tactile and VC Standing step ups onto balance stones x 10 with each LE leading with counter and close supervision for safety/balance Side step on/off balance stones x 10 reps with standby supervision and counter for balance/support for safety Side stepping  along balance beam x 2 min.    Patient response to treatment: Patient demonstrated improved technique and endurance with exercises with repetition and VC. He improved posture with standing and walking exercises with minimal cuing. Improved ankle DF and left quadriceps control following estim. As demonstrated with improved motor control with exercises.         PT Education - 03/03/16 1050    Education provided Yes    Education Details HEP: re assessed home exercises and quad strengthening   Person(s) Educated Patient   Methods Explanation   Comprehension Verbalized understanding             PT Long Term Goals - 03/01/16 1023      PT LONG TERM GOAL #1   Title Patient will be able to walk safely, erect posture with appropriate AD on level surfaces >1000' and stairs by 03/18/2016    Baseline 02/05/2016 800' without AD  (limited to 4 min. due to fatigue, lower back pain)   Status On-going     PT LONG TERM GOAL #2   Title Patient will demonstrate improved function for daily activities with mild pain as indicated by modified oswestry score of 30% or less by 03/18/2016   Baseline MODI 62% (severe self perceived disability); MODI 12/05/15 50%; 01/12/2016 = 48%; 02/16/2016 45%   Status On-going     PT LONG TERM GOAL #3   Title Patient will demonstrate functional community ambulation and low fall rist by 10MW of 10 seconds or less by 03/18/2016   Baseline 12/09/15 11.5 seconds; 01/12/2016 deferred; 02/05/16 12.25 seconds   Status On-going     PT LONG TERM GOAL #4   Title Patient will be independent with home program for exercises and self management by 03/18/2016 to continue with progressive healing once discharged from physical therapy    Baseline 01/12/16 limited knowledge of appropriate exercises and progression and requires assistance for performing exercises and progressing intensity appropriately   Status On-going               Plan - 03/03/16 1050    Clinical Impression Statement Patient is progressing well towards goals and continues with significant atrophy and weakness in left quadriceps that limit his ability to walk without difficulty. He has decreased endurance as well which limits standing activities and walking. He will require continued physical therapy interveniton to achieve maximal gains to improve function with walking and daily tasks.    PT Frequency 3x / week   PT Duration  6 weeks   PT Treatment/Interventions Patient/family education;Gait training;Cryotherapy;Therapeutic activities;Therapeutic exercise;Neuromuscular re-education;Scar mobilization;Manual techniques;Moist Heat;Electrical Stimulation   PT Next Visit Plan progress exercises, core control, gait training, weight shifting; electrical stimulation left LE/RLE   PT Home Exercise Plan core control, LE exercises, balance/walking activities; rest for the next 1-2 days and control swelling/pain in left knee/LE      Patient will benefit from skilled therapeutic intervention in order to improve the following deficits and impairments:  Decreased strength, Decreased activity tolerance, Decreased knowledge of precautions, Pain, Decreased endurance, Decreased range of motion, Difficulty walking, Increased muscle spasms  Visit Diagnosis: Muscle weakness (generalized)  Difficulty in walking, not elsewhere classified     Problem List Patient Active Problem List   Diagnosis Date Noted  . Cellulitis 11/27/2015  . Pedal edema 11/27/2015  . Adjustment disorder with depressed mood   . Osteoarthritis of spine with radiculopathy, lumbar region 10/15/2015  . Abnormal MRI   . Slurred speech   . Surgery, elective   .  Loose stools   . OSA (obstructive sleep apnea)   . Acute blood loss anemia   . Pneumonia   . Urinary retention   . Lewy body dementia   . Encephalopathy   . Altered mental status   . Acute encephalopathy 10/12/2015  . Hypokalemia 10/12/2015  . Anemia 10/12/2015  . S/P lumbar fusion 10/12/2015  . Acute respiratory failure (HCC)   . Degenerative disc disease, lumbar 10/01/2015  . Sciatica of right side associated with disorder of lumbosacral spine 07/21/2015  . Hypertension 02/18/2015  . Diabetes (HCC) 02/18/2015  . Gout 02/18/2015  . Hyperlipidemia 02/18/2015  . Arthritis 02/18/2015  . Encounter to establish care 02/18/2015    Beacher May PT 03/04/2016, 8:42 AM  [facility]  [facility] PHYSICAL AND SPORTS MEDICINE [address], [place], [address] Phone: [phone]   Fax:  [phone]  Name: EIVAN GALLINA MRN: [id number] Date of Birth: 08-13-1945

## 2016-03-05 ENCOUNTER — Ambulatory Visit: Payer: PPO | Admitting: Physical Therapy

## 2016-03-05 ENCOUNTER — Encounter: Payer: Self-pay | Admitting: Physical Therapy

## 2016-03-05 ENCOUNTER — Encounter: Payer: PPO | Admitting: Physical Therapy

## 2016-03-05 DIAGNOSIS — R262 Difficulty in walking, not elsewhere classified: Secondary | ICD-10-CM

## 2016-03-05 DIAGNOSIS — M6281 Muscle weakness (generalized): Secondary | ICD-10-CM

## 2016-03-05 NOTE — Therapy (Signed)
[place] [facility] [address] [place], [name], [address] Phone: [phone]   Fax:  [phone]  Physical Therapy Treatment  Patient Details  Name: Danny StacksGary L Kopinski MRN: [id number] Date of Birth: [birth year] Referring Provider: Karn CassisBotero, Ernesto M. MD  Encounter Date: 03/05/2016      PT End of Session - 03/05/16 1230    Visit Number 42   Number of Visits 48   Date for PT Re-Evaluation 03/18/16   Authorization Type 42   Authorization Time Period 50 (G code)   PT Start Time 1100   PT Stop Time 1155   PT Time Calculation (min) 55 min   Activity Tolerance Patient tolerated treatment well;Patient limited by fatigue   Behavior During Therapy [facility] for tasks assessed/performed      Past Medical History:  Diagnosis Date  . Allergy   . Arthritis   . Diabetes mellitus without complication (HCC)   . Gout   . Hyperlipidemia   . Hypertension   . Kidney stone   . Rheumatic fever     Past Surgical History:  Procedure Laterality Date  . BACK SURGERY    . knot     removed from neck  . POSTERIOR LUMBAR FUSION 4 LEVEL N/A 10/01/2015   Procedure: Lumbar three-four,  Lumbar four-five,  Lumbar five-Sacrum one posterior lumbar interbody fusion with Laminotomy at Lumbar Two-Three;  Surgeon: Hilda LiasErnesto Botero, MD;  Location: [facility];  Service: Neurosurgery;  Laterality: N/A;    There were no vitals filed for this visit.      Subjective Assessment - 03/05/16 1121    Subjective Patient reports yesterday his back was very painful after waking up (still sleeping in recliner). He was seen by Dr. Jeral FruitBotero and is having more diagnostic testing for continued weakness    Limitations Lifting;Standing;Walking;House hold activities;Other (comment)   Patient Stated Goals get back to full function for work and be able to walk without AD   Currently in Pain? No/denies      Objective: Gait: more erect posture noted on arrival as compared to  previous session, steppage gait pattern right LE Observation: left LE atrophy of quadriceps as compared to right  Treatment: Modalities: Electrical stimulation x 20 min.: russian stim. 10/10 cycle applied (2) electrodes: left quadriceps and high volt to medial/lateral aspect left knee and russian stim. To right anterior tibialis with intensity to tolerance/contraction with patient seated in chair with LE's elevated andperforming active quad sets with each cycle: goal: neuromuscular re education/pain  Therapeutic exercise: patient performed with VC, tactile cues and demonstration of therapist as needed  Rocker board for weight shift/ankle DF/PF x 2 min. Each Knee flexion with blue resistive band 1 x 25 reps Sitting slouch/correct x 15 reps with tactile and VC Standing step ups onto balance beam + balance pad  x 15 with each LE leading UE support and close supervision for safety/balance Side step on/off balance beam + balance pad  x 10 reps with standby supervision and UE support  for balance/support for safety Side stepping along balance beam x 2 min. Cable 10# single arm row standing x 15 -20 reps each UE Cable 5# single arm extension x 15 reps each UE    Patient response to treatment: Patient able to perform all exercises with minimal VC, demonstration for correct technique. Improved quad control and right ankle control following estim  Treatment.         PT Education - 03/05/16 1133  Education provided Yes   Education Details Discussed progression with therapy based on finding from further evaluation of back s/p surgery   Person(s) Educated Patient   Methods Explanation   Comprehension Verbalized understanding             PT Long Term Goals - 03/01/16 1023      PT LONG TERM GOAL #1   Title Patient will be able to walk safely, erect posture with appropriate AD on level surfaces >1000' and stairs by 03/18/2016    Baseline 02/05/2016 800' without AD  (limited to 4  min. due to fatigue, lower back pain)   Status On-going     PT LONG TERM GOAL #2   Title Patient will demonstrate improved function for daily activities with mild pain as indicated by modified oswestry score of 30% or less by 03/18/2016   Baseline MODI 62% (severe self perceived disability); MODI 12/05/15 50%; 01/12/2016 = 48%; 02/16/2016 45%   Status On-going     PT LONG TERM GOAL #3   Title Patient will demonstrate functional community ambulation and low fall rist by 10MW of 10 seconds or less by 03/18/2016   Baseline 12/09/15 11.5 seconds; 01/12/2016 deferred; 02/05/16 12.25 seconds   Status On-going     PT LONG TERM GOAL #4   Title Patient will be independent with home program for exercises and self management by 03/18/2016 to continue with progressive healing once discharged from physical therapy    Baseline 01/12/16 limited knowledge of appropriate exercises and progression and requires assistance for performing exercises and progressing intensity appropriately   Status On-going               Plan - 03/05/16 1220    Clinical Impression Statement Patient is progressing with goals and continues with significant atrophy and weakness in left quadriceps that limits his ability to walk without difficulty. He is progressing steadily and slowly s/p multi level fusion and is now being scheduled for additional testing to determine if he is progressing as he should. He should continue to improve with additional physical therapy intervention to address limitations.    Rehab Potential Good   PT Frequency 3x / week   PT Duration 6 weeks   PT Treatment/Interventions Patient/family education;Gait training;Cryotherapy;Therapeutic activities;Therapeutic exercise;Neuromuscular re-education;Scar mobilization;Manual techniques;Moist Heat;Electrical Stimulation   PT Next Visit Plan progress exercises, core control, gait training, weight shifting; electrical stimulation left LE/RLE   PT Home Exercise  Plan core control, LE exercises, balance/walking activities; rest for the next 1-2 days and control swelling/pain in left knee/LE      Patient will benefit from skilled therapeutic intervention in order to improve the following deficits and impairments:  Decreased strength, Decreased activity tolerance, Decreased knowledge of precautions, Pain, Decreased endurance, Decreased range of motion, Difficulty walking, Increased muscle spasms  Visit Diagnosis: Muscle weakness (generalized)  Difficulty in walking, not elsewhere classified     Problem List Patient Active Problem List   Diagnosis Date Noted  . Cellulitis 11/27/2015  . Pedal edema 11/27/2015  . Adjustment disorder with depressed mood   . Osteoarthritis of spine with radiculopathy, lumbar region 10/15/2015  . Abnormal MRI   . Slurred speech   . Surgery, elective   . Loose stools   . OSA (obstructive sleep apnea)   . Acute blood loss anemia   . Pneumonia   . Urinary retention   . Lewy body dementia   . Encephalopathy   . Altered mental status   . Acute encephalopathy 10/12/2015  .  Hypokalemia 10/12/2015  . Anemia 10/12/2015  . S/P lumbar fusion 10/12/2015  . Acute respiratory failure (HCC)   . Degenerative disc disease, lumbar 10/01/2015  . Sciatica of right side associated with disorder of lumbosacral spine 07/21/2015  . Hypertension 02/18/2015  . Diabetes (HCC) 02/18/2015  . Gout 02/18/2015  . Hyperlipidemia 02/18/2015  . Arthritis 02/18/2015  . Encounter to establish care 02/18/2015    Beacher May PT 03/05/2016, 12:34 PM  [place] [facility] PHYSICAL AND SPORTS MEDICINE [address], [place], [address] Phone: [phone]   Fax:  [phone]  Name: LAYDEN CATERINO MRN: [id number] Date of Birth: 1945/08/26

## 2016-03-08 ENCOUNTER — Ambulatory Visit: Payer: PPO | Admitting: Physical Therapy

## 2016-03-08 ENCOUNTER — Encounter: Payer: Self-pay | Admitting: Physical Therapy

## 2016-03-08 ENCOUNTER — Encounter: Payer: Self-pay | Admitting: Family Medicine

## 2016-03-08 ENCOUNTER — Ambulatory Visit (INDEPENDENT_AMBULATORY_CARE_PROVIDER_SITE_OTHER): Payer: PPO | Admitting: Family Medicine

## 2016-03-08 VITALS — BP 170/80 | HR 61 | Temp 98.0°F | Resp 16 | Ht 71.0 in | Wt 238.0 lb

## 2016-03-08 DIAGNOSIS — E0800 Diabetes mellitus due to underlying condition with hyperosmolarity without nonketotic hyperglycemic-hyperosmolar coma (NKHHC): Secondary | ICD-10-CM

## 2016-03-08 DIAGNOSIS — E785 Hyperlipidemia, unspecified: Secondary | ICD-10-CM | POA: Diagnosis not present

## 2016-03-08 DIAGNOSIS — M6281 Muscle weakness (generalized): Secondary | ICD-10-CM | POA: Diagnosis not present

## 2016-03-08 DIAGNOSIS — R600 Localized edema: Secondary | ICD-10-CM

## 2016-03-08 DIAGNOSIS — I10 Essential (primary) hypertension: Secondary | ICD-10-CM

## 2016-03-08 DIAGNOSIS — R262 Difficulty in walking, not elsewhere classified: Secondary | ICD-10-CM

## 2016-03-08 MED ORDER — AMLODIPINE BESYLATE 5 MG PO TABS: 5.0000 mg | ORAL_TABLET | Freq: Every day | ORAL | 6 refills | Status: DC

## 2016-03-08 NOTE — Progress Notes (Signed)
Name: Danny Patterson   MRN: [id number]    DOB: 1945/06/24   Date:03/08/2016       Progress Note  Subjective  Chief Complaint  Chief Complaint  Patient presents with  . Hypertension    HPI Here for f/u of HBP.  Also with DM and elevated lipids.  His BSs at home 72-118.  On 1/2 1000 Metformin daily  No problem-specific Assessment & Plan notes found for this encounter.   Past Medical History:  Diagnosis Date  . Allergy   . Arthritis   . Diabetes mellitus without complication (HCC)   . Gout   . Hyperlipidemia   . Hypertension   . Kidney stone   . Rheumatic fever     Past Surgical History:  Procedure Laterality Date  . BACK SURGERY    . knot     removed from neck  . POSTERIOR LUMBAR FUSION 4 LEVEL N/A 10/01/2015   Procedure: Lumbar three-four,  Lumbar four-five,  Lumbar five-Sacrum one posterior lumbar interbody fusion with Laminotomy at Lumbar Two-Three;  Surgeon: Hilda LiasErnesto Botero, MD;  Location: [facility];  Service: Neurosurgery;  Laterality: N/A;    Family History  Problem Relation Age of Onset  . Parkinson's disease Father [id number]  . Kidney disease Maternal Grandmother     Social History   Social History  . Marital status: Married    Spouse name: N/A  . Number of children: N/A  . Years of education: N/A   Occupational History  . Not on file.   Social History Main Topics  . Smoking status: Former Smoker    Types: Cigarettes  . Smokeless tobacco: Never Used  . Alcohol use 1.8 oz/week    3 Cans of beer per week  . Drug use: No  . Sexual activity: Not on file   Other Topics Concern  . Not on file   Social History Narrative  . No narrative on file     Current Outpatient Prescriptions:  .  amLODipine (NORVASC) 5 MG tablet, Take 1 tablet (5 mg total) by mouth daily. Take 1 tablet daily, Disp: 30 tablet, Rfl: 6 .  atorvastatin (LIPITOR) 20 MG tablet, Take 1 tablet (20 mg total) by mouth daily at 6 PM., Disp: 30 tablet, Rfl: 12 .  furosemide (LASIX) 20 MG  tablet, Take 2 tablets each AM for swelling, Disp: 60 tablet, Rfl: 6 .  gabapentin (NEURONTIN) 100 MG capsule, Take 100 mg by mouth 3 (three) times daily., Disp: , Rfl:  .  KLOR-CON M20 20 MEQ tablet, TAKE 1 TABLET (20 MEQ TOTAL) BY MOUTH DAILY., Disp: 30 tablet, Rfl: 6 .  magnesium gluconate (MAGONATE) 500 MG tablet, Take 500 mg by mouth daily., Disp: , Rfl:  .  methocarbamol (ROBAXIN) 500 MG tablet, TAKE 1 TABLET (500 MG TOTAL) BY MOUTH 3 (THREE) TIMES DAILY. FOR MUSCLE ACHES/SPASMS, Disp: 30 tablet, Rfl: 2 .  naproxen sodium (ANAPROX) 220 MG tablet, Take 220 mg by mouth 2 (two) times daily with a meal., Disp: , Rfl:  .  traMADol (ULTRAM) 50 MG tablet, Take 50 mg by mouth every 6 (six) hours as needed., Disp: , Rfl:  .  acetaminophen (TYLENOL) 325 MG tablet, Take 2 tablets (650 mg total) by mouth 4 (four) times daily -  with meals and at bedtime. (Patient not taking: Reported on 03/08/2016), Disp: , Rfl:  .  tamsulosin (FLOMAX) 0.4 MG CAPS capsule, TAKE 1 CAPSULE (0.4 MG TOTAL) BY MOUTH DAILY. (Patient not taking: Reported on 03/08/2016),  Disp: 30 capsule, Rfl: 6  Allergies  Allergen Reactions  . Ace Inhibitors Other (See Comments)    Angioedema.     Review of Systems  Constitutional: Negative for chills, fever, malaise/fatigue and weight loss.  HENT: Negative for hearing loss and tinnitus.   Eyes: Negative for blurred vision and double vision.  Respiratory: Negative for cough, shortness of breath and wheezing.   Cardiovascular: Negative for chest pain, palpitations and leg swelling.  Gastrointestinal: Negative for abdominal pain, blood in stool and heartburn.  Genitourinary: Negative for dysuria, frequency and urgency.  Musculoskeletal: Positive for joint pain (knee, L). Negative for myalgias.  Skin: Negative for rash.  Neurological: Positive for tingling (feet.). Negative for dizziness, tremors, weakness and headaches.      Objective  Vitals:   03/08/16 1439 03/08/16 1517  BP:  (!) 165/83 (!) 170/80  Pulse: 61   Resp: 16   Temp: 98 F (36.7 C)   TempSrc: Oral   Weight: 238 lb (108 kg)   Height: 5\' 11"  (1.803 m)     Physical Exam  Constitutional: He is oriented to person, place, and time and well-developed, well-nourished, and in no distress. No distress.  HENT:  Head: Normocephalic and atraumatic.  Eyes: Conjunctivae and EOM are normal. Pupils are equal, round, and reactive to light. No scleral icterus.  Neck: Normal range of motion. Neck supple. Carotid bruit is not present. No thyromegaly present.  Cardiovascular: Normal rate, regular rhythm and normal heart sounds.  Exam reveals no gallop and no friction rub.   No murmur heard. Pulmonary/Chest: Effort normal and breath sounds normal. He has no wheezes. He has no rales.  Musculoskeletal: He exhibits no edema.  Lymphadenopathy:    He has no cervical adenopathy.  Neurological: He is alert and oriented to person, place, and time.  Vitals reviewed.      No results found for this or any previous visit (from the past 2160 hour(s)).   Assessment & Plan  Problem List Items Addressed This Visit      Cardiovascular and Mediastinum   Hypertension - Primary   Relevant Medications   amLODipine (NORVASC) 5 MG tablet     Endocrine   Diabetes (HCC)     Other   Hyperlipidemia   Relevant Medications   amLODipine (NORVASC) 5 MG tablet   Pedal edema      Meds ordered this encounter  Medications  . naproxen sodium (ANAPROX) 220 MG tablet    Sig: Take 220 mg by mouth 2 (two) times daily with a meal.  . amLODipine (NORVASC) 5 MG tablet    Sig: Take 1 tablet (5 mg total) by mouth daily. Take 1 tablet daily    Dispense:  30 tablet    Refill:  6    1. Essential hypertension  - amLODipine (NORVASC) 5 MG tablet; Take 1 tablet (5 mg total) by mouth daily. Take 1 tablet daily  Dispense: 30 tablet; Refill: 6 - increased from 2.5 mg/d. 2. Diabetes mellitus due to underlying condition with hyperosmolarity  without coma, without long-term current use of insulin (HCC) Discontinue Metformin  3. Hyperlipidemia, unspecified hyperlipidemia type  Cont Lipitor 4. Pedal edema cont Lasix

## 2016-03-08 NOTE — Therapy (Signed)
[place] [facility] [address], [place], [address] Phone: [phone]   Fax:  [phone]  Physical Therapy Treatment  Patient Details  Name: Danny Patterson MRN: [id number] Date of Birth: 01-20-1946 Referring Provider: Karn Cassis MD  Encounter Date: 03/08/2016      PT End of Session - 03/08/16 1020    Visit Number 43   Number of Visits 48   Date for PT Re-Evaluation 03/18/16   Authorization Type [id number]   Authorization Time Period 50 (G code)   PT Start Time 0958   PT Stop Time 1052   PT Time Calculation (min) 54 min   Activity Tolerance Patient tolerated treatment well;Patient limited by fatigue   Behavior During Therapy [facility] for tasks assessed/performed      Past Medical History:  Diagnosis Date  . Allergy   . Arthritis   . Diabetes mellitus without complication (HCC)   . Gout   . Hyperlipidemia   . Hypertension   . Kidney stone   . Rheumatic fever     Past Surgical History:  Procedure Laterality Date  . BACK SURGERY    . knot     removed from neck  . POSTERIOR LUMBAR FUSION 4 LEVEL N/A 10/01/2015   Procedure: Lumbar three-four,  Lumbar four-five,  Lumbar five-Sacrum one posterior lumbar interbody fusion with Laminotomy at Lumbar Two-Three;  Surgeon: Hilda Lias, MD;  Location: [facility];  Service: Neurosurgery;  Laterality: N/A;    There were no vitals filed for this visit.      Subjective Assessment - 03/08/16 1017    Subjective Patient reports he has been resting more at home over the weekend and he is doing his exercises at home for posture and walking and strengthening and the feeling of irritation on left thigh is getting better, His left knee still feels swollen. His feet are getting better and the numb like feeling in the balls of his feet have decreased and the feeling is moving more towards the instep of his feet.    Limitations Lifting;Standing;Walking;House hold  activities;Other (comment)   Patient Stated Goals get back to full function for work and be able to walk without AD   Currently in Pain? No/denies      Objective: Gait: more erect posture noted on arrival as compared to previous session, steppage gait pattern right LE minimal Observation: atrophy of left quadriceps as compared to right  Treatment: Modalities: Electrical stimulation x 20 min.: russian stim. 10/10 cycle applied (2) electrodes: left quadriceps and high volt to medial/lateral aspect left knee and russian stim. To right anterior tibialis with intensity to tolerance/contraction with patient seated in chair with LE's elevated andperforming active quad sets with each cycle: goal: neuromuscular re education/pain  Therapeutic exercise: patient performed with VC, tactile cues and demonstration of therapist as needed  Rocker board for weight shift/ankle DF/PF x 2 min. Each Knee extension with 3# weights 2 x 15 reps with guided motion Knee flexion with blue resistive band 1 x 25 reps Sitting slouch/correct x 15 reps with tactile and VC Standing step ups onto balance beam x 15 with each LE leading UE support and close supervision for safety/balance Side step on/off balance beam + balance pad  x 15 reps with standby supervision and UE support  for balance/support for safety Side stepping along balance beam x 2 min. Ball between knees with hip adduction and glute sets x 15 reps Hip abduction with blue  resistive band and manual resistance at end range x 15 reps, followed by 25 reps with just band    Patient response to treatment: Patient abel to perform all exercises with minimal VC and demonstration for correct posture/technique. Improved quad control with exercises following estim.          PT Education - 03/08/16 1019    Education provided Yes   Education Details HEP: continue with home exercises in moderation and guided by pain   Person(s) Educated Patient   Methods  Explanation   Comprehension Verbalized understanding             PT Long Term Goals - 03/01/16 1023      PT LONG TERM GOAL #1   Title Patient will be able to walk safely, erect posture with appropriate AD on level surfaces >1000' and stairs by 03/18/2016    Baseline 02/05/2016 800' without AD  (limited to 4 min. due to fatigue, lower back pain)   Status On-going     PT LONG TERM GOAL #2   Title Patient will demonstrate improved function for daily activities with mild pain as indicated by modified oswestry score of 30% or less by 03/18/2016   Baseline MODI 62% (severe self perceived disability); MODI 12/05/15 50%; 01/12/2016 = 48%; 02/16/2016 45%   Status On-going     PT LONG TERM GOAL #3   Title Patient will demonstrate functional community ambulation and low fall rist by 10MW of 10 seconds or less by 03/18/2016   Baseline 12/09/15 11.5 seconds; 01/12/2016 deferred; 02/05/16 12.25 seconds   Status On-going     PT LONG TERM GOAL #4   Title Patient will be independent with home program for exercises and self management by 03/18/2016 to continue with progressive healing once discharged from physical therapy    Baseline 01/12/16 limited knowledge of appropriate exercises and progression and requires assistance for performing exercises and progressing intensity appropriately   Status On-going               Plan - 03/08/16 1021    Clinical Impression Statement Patient is progressing well with all goals and is able to perform exercises with mild fatigue in back and LE's. He is demonstrating improving endurance with exercises as indicated by ability to perform increased repetitions with mild fatigue.    Rehab Potential Good   PT Frequency 3x / week   PT Duration 6 weeks   PT Treatment/Interventions Patient/family education;Gait training;Cryotherapy;Therapeutic activities;Therapeutic exercise;Neuromuscular re-education;Scar mobilization;Manual techniques;Moist Heat;Electrical  Stimulation   PT Next Visit Plan progress exercises, core control, gait training, weight shifting; electrical stimulation left LE/RLE   PT Home Exercise Plan core control, LE exercises, balance/walking activities; rest for the next 1-2 days and control swelling/pain in left knee/LE      Patient will benefit from skilled therapeutic intervention in order to improve the following deficits and impairments:  Decreased strength, Decreased activity tolerance, Decreased knowledge of precautions, Pain, Decreased endurance, Decreased range of motion, Difficulty walking, Increased muscle spasms  Visit Diagnosis: Muscle weakness (generalized)  Difficulty in walking, not elsewhere classified     Problem List Patient Active Problem List   Diagnosis Date Noted  . Cellulitis 11/27/2015  . Pedal edema 11/27/2015  . Adjustment disorder with depressed mood   . Osteoarthritis of spine with radiculopathy, lumbar region 10/15/2015  . Abnormal MRI   . Slurred speech   . Surgery, elective   . Loose stools   . OSA (obstructive sleep apnea)   .  Acute blood loss anemia   . Pneumonia   . Urinary retention   . Lewy body dementia   . Encephalopathy   . Altered mental status   . Acute encephalopathy 10/12/2015  . Hypokalemia 10/12/2015  . Anemia 10/12/2015  . S/P lumbar fusion 10/12/2015  . Acute respiratory failure (HCC)   . Degenerative disc disease, lumbar 10/01/2015  . Sciatica of right side associated with disorder of lumbosacral spine 07/21/2015  . Hypertension 02/18/2015  . Diabetes (HCC) 02/18/2015  . Gout 02/18/2015  . Hyperlipidemia 02/18/2015  . Arthritis 02/18/2015  . Encounter to establish care 02/18/2015    Beacher MayBrooks, Tryniti Laatsch PT 03/08/2016, 3:23 PM  [facility] [address] [place], [name], [address] Phone: [phone]   Fax:  [phone]  Name: Rupert StacksGary L Foote MRN: [id number] Date of Birth: 05/03/1945

## 2016-03-10 ENCOUNTER — Ambulatory Visit: Payer: PPO | Admitting: Physical Therapy

## 2016-03-12 ENCOUNTER — Encounter: Payer: Self-pay | Admitting: Physical Therapy

## 2016-03-12 ENCOUNTER — Other Ambulatory Visit: Payer: Self-pay | Admitting: Neurosurgery

## 2016-03-12 ENCOUNTER — Ambulatory Visit: Payer: PPO | Admitting: Physical Therapy

## 2016-03-12 DIAGNOSIS — M5416 Radiculopathy, lumbar region: Secondary | ICD-10-CM

## 2016-03-12 DIAGNOSIS — M6281 Muscle weakness (generalized): Secondary | ICD-10-CM | POA: Diagnosis not present

## 2016-03-12 DIAGNOSIS — M25561 Pain in right knee: Secondary | ICD-10-CM

## 2016-03-12 DIAGNOSIS — R262 Difficulty in walking, not elsewhere classified: Secondary | ICD-10-CM

## 2016-03-12 DIAGNOSIS — M25562 Pain in left knee: Secondary | ICD-10-CM

## 2016-03-12 NOTE — Therapy (Signed)
[facility] [address] [place], [name], [address] Phone: [phone]   Fax:  [phone]  Physical Therapy Treatment  Patient Details  Name: Danny StacksGary L Craton MRN: [id number] Date of Birth: February 25, 1946 Referring Provider: Karn CassisBotero, Ernesto M. MD  Encounter Date: 03/12/2016      PT End of Session - 03/12/16 0938    Visit Number 44   Number of Visits 48   Date for PT Re-Evaluation 03/18/16   Authorization Type 44   Authorization Time Period 50 (G code)   PT Start Time 0916   PT Stop Time 1002   PT Time Calculation (min) 46 min   Activity Tolerance Patient tolerated treatment well;Patient limited by fatigue   Behavior During Therapy [facility] for tasks assessed/performed      Past Medical History:  Diagnosis Date  . Allergy   . Arthritis   . Diabetes mellitus without complication (HCC)   . Gout   . Hyperlipidemia   . Hypertension   . Kidney stone   . Rheumatic fever     Past Surgical History:  Procedure Laterality Date  . BACK SURGERY    . knot     removed from neck  . POSTERIOR LUMBAR FUSION 4 LEVEL N/A 10/01/2015   Procedure: Lumbar three-four,  Lumbar four-five,  Lumbar five-Sacrum one posterior lumbar interbody fusion with Laminotomy at Lumbar Two-Three;  Surgeon: Hilda LiasErnesto Botero, MD;  Location: [facility];  Service: Neurosurgery;  Laterality: N/A;    There were no vitals filed for this visit.      Subjective Assessment - 03/12/16 0929    Subjective Patient reports he is feeling stronger in his legs and is able to stand up with more erect posture and is noticing improving endurance. He feels therapy is still beneficial.   Limitations Lifting;Standing;Walking;House hold activities;Other (comment)   Patient Stated Goals get back to full function for work and be able to walk without AD   Currently in Pain? No/denies     Objective: Gait: more erect posture noted on arrival as compared to previous  session Observation: left LE quadriceps muscle with moderate atrophy as compared to right  Treatment: Modalities: Electrical stimulation x 20 min.: russian stim. 10/10 cycle applied (2) electrodes: left quadriceps and high volt to medial/lateral aspect left knee and russian stim. To right anterior tibialis with intensity to tolerance/contraction with patient seated in chair with LE's elevated andperforming active quad sets with each cycle: goal: neuromuscular re education/pain  Therapeutic exercise: patient performed with VC, tactile cues and demonstration of therapist as needed  Rocker board for weight shift/ankle DF/PF x 2 min. Each Knee flexion with blue resistive band 1x 25 reps Sitting slouch/correct x 15 reps with tactile and VC Standing step ups onto balance beam with balance pad underneathx 15with each LE leading UE support and close supervision for safety/balance Side step on/off balance beam + balance pad x 15 reps with standby supervision and UE support for balance/support for safety Side stepping along balance beam x 2 min. Ball between knees with hip adduction and glute sets x 15 reps Hip abduction with blue resistive band and manual resistance at end range x 15 reps Standing core with UE row each 15 reps with 10# Bilateral row standing 15# x 15 reps    Patient response to treatment: Patient abel to perform all exercises with minimal VC and demonstration for correct posture/technique. Improved quad control with exercises following estim.  PT Education - 03/12/16 0936    Education provided Yes   Education Details HEP: continue ROM, strengthening and balance exercises   Person(s) Educated Patient   Methods Explanation   Comprehension Verbalized understanding             PT Long Term Goals - 03/01/16 1023      PT LONG TERM GOAL #1   Title Patient will be able to walk safely, erect posture with appropriate AD on level surfaces >1000' and stairs  by 03/18/2016    Baseline 02/05/2016 800' without AD  (limited to 4 min. due to fatigue, lower back pain)   Status On-going     PT LONG TERM GOAL #2   Title Patient will demonstrate improved function for daily activities with mild pain as indicated by modified oswestry score of 30% or less by 03/18/2016   Baseline MODI 62% (severe self perceived disability); MODI 12/05/15 50%; 01/12/2016 = 48%; 02/16/2016 45%   Status On-going     PT LONG TERM GOAL #3   Title Patient will demonstrate functional community ambulation and low fall rist by of 10 seconds or less by 03/18/2016   Baseline 12/09/15 11.5 seconds; 01/12/2016 deferred; 02/05/16 12.25 seconds   Status On-going     PT LONG TERM GOAL #4   Title Patient will be independent with home program for exercises and self management by 03/18/2016 to continue with progressive healing once discharged from physical therapy    Baseline 01/12/16 limited knowledge of appropriate exercises and progression and requires assistance for performing exercises and progressing intensity appropriately   Status On-going               Plan - 03/12/16 0939    Clinical Impression Statement Patient is progressing well with all goals and is able to perform exerciess with mild fatigue in back and LE's. He is demonstrating improvement with right ankle DF and left quad control with estim. He continues with weakness in back, LE's and will benefit from additional physical therapy intervention to achieve maximal function with walking and daily activities.    Rehab Potential Good   PT Frequency 3x / week   PT Duration 6 weeks   PT Treatment/Interventions Patient/family education;Gait training;Cryotherapy;Therapeutic activities;Therapeutic exercise;Neuromuscular re-education;Scar mobilization;Manual techniques;Moist Heat;Electrical Stimulation   PT Next Visit Plan progress exercises, core control, gait training, weight shifting; electrical stimulation left LE/RLE    PT Home Exercise Plan core control, LE exercises, balance/walking activities; rest for the next 1-2 days and control swelling/pain in left knee/LE      Patient will benefit from skilled therapeutic intervention in order to improve the following deficits and impairments:  Decreased strength, Decreased activity tolerance, Decreased knowledge of precautions, Pain, Decreased endurance, Decreased range of motion, Difficulty walking, Increased muscle spasms  Visit Diagnosis: Muscle weakness (generalized)  Difficulty in walking, not elsewhere classified  Pain in both knees, unspecified chronicity     Problem List Patient Active Problem List   Diagnosis Date Noted  . Cellulitis 11/27/2015  . Pedal edema 11/27/2015  . Adjustment disorder with depressed mood   . Osteoarthritis of spine with radiculopathy, lumbar region 10/15/2015  . Abnormal MRI   . Slurred speech   . Surgery, elective   . Loose stools   . OSA (obstructive sleep apnea)   . Acute blood loss anemia   . Pneumonia   . Urinary retention   . Lewy body dementia   . Encephalopathy   . Altered mental status   .  Acute encephalopathy 10/12/2015  . Hypokalemia 10/12/2015  . Anemia 10/12/2015  . S/P lumbar fusion 10/12/2015  . Acute respiratory failure (HCC)   . Degenerative disc disease, lumbar 10/01/2015  . Sciatica of right side associated with disorder of lumbosacral spine 07/21/2015  . Hypertension 02/18/2015  . Diabetes (HCC) 02/18/2015  . Gout 02/18/2015  . Hyperlipidemia 02/18/2015  . Arthritis 02/18/2015  . Encounter to establish care 02/18/2015    Beacher MayBrooks, Marie PT 03/13/2016, 2:40 PM  [place] [facility] [address] [place], [name], [address] Phone: [phone]   Fax:  [phone]  Name: Danny StacksGary L Losee MRN: [id number] Date of Birth: Jul 28, 1945

## 2016-03-17 ENCOUNTER — Encounter: Payer: Self-pay | Admitting: Physical Therapy

## 2016-03-17 ENCOUNTER — Ambulatory Visit: Payer: PPO | Admitting: Physical Therapy

## 2016-03-17 DIAGNOSIS — M25562 Pain in left knee: Secondary | ICD-10-CM

## 2016-03-17 DIAGNOSIS — M6281 Muscle weakness (generalized): Secondary | ICD-10-CM | POA: Diagnosis not present

## 2016-03-17 DIAGNOSIS — M25561 Pain in right knee: Secondary | ICD-10-CM

## 2016-03-17 DIAGNOSIS — R262 Difficulty in walking, not elsewhere classified: Secondary | ICD-10-CM

## 2016-03-17 NOTE — Therapy (Signed)
[facility] [address], [place], [address] Phone: [phone]   Fax:  [phone]  Physical Therapy Treatment  Patient Details  Name: Danny Patterson MRN: [id number] Date of Birth: Jan 19, 1946 Referring Provider: Karn Cassis MD  Encounter Date: 03/17/2016      PT End of Session - 03/17/16 1124    Visit Number 45   Number of Visits 48   Date for PT Re-Evaluation 03/18/16   Authorization Type 45   Authorization Time Period 50 (G code)   PT Start Time 1035   PT Stop Time 1120   PT Time Calculation (min) 45 min   Activity Tolerance Patient tolerated treatment well;Patient limited by fatigue   Behavior During Therapy [facility] for tasks assessed/performed      Past Medical History:  Diagnosis Date  . Allergy   . Arthritis   . Diabetes mellitus without complication (HCC)   . Gout   . Hyperlipidemia   . Hypertension   . Kidney stone   . Rheumatic fever     Past Surgical History:  Procedure Laterality Date  . BACK SURGERY    . knot     removed from neck  . POSTERIOR LUMBAR FUSION 4 LEVEL N/A 10/01/2015   Procedure: Lumbar three-four,  Lumbar four-five,  Lumbar five-Sacrum one posterior lumbar interbody fusion with Laminotomy at Lumbar Two-Three;  Surgeon: Hilda Lias, MD;  Location: [facility];  Service: Neurosurgery;  Laterality: N/A;    There were no vitals filed for this visit.      Subjective Assessment - 03/17/16 1104    Subjective Patient reports he is feeling stronger in his legs and is able to stand up with more erect posture and is noticing improving endurance. He feels therapy is still beneficial. Going for myelogram next Friday.    Limitations Lifting;Standing;Walking;House hold activities;Other (comment)   Patient Stated Goals get back to full function for work and be able to walk without AD   Currently in Pain? No/denies      Objective: Gait: Improving posture awareness with  walking and standing with decreased forward flexion at hips Observation: left LE quadriceps muscle with moderate atrophy as compared to right  Treatment: Modalities: Electrical stimulation x 20 min.: russian stim. 10/10 cycle applied (2) electrodes: left quadriceps and high volt to medial/lateral aspect left knee and russian stim. To right anterior tibialis with intensity to tolerance/contraction with patient seated in chair with LE's elevated andperforming active quad sets with each cycle: goal: neuromuscular re education/pain  Therapeutic exercise: patient performed with VC, tactile cues and demonstration of therapist as needed  Rocker board for weight shift/ankle DF/PF with balance stones on top x 2 min. Each Knee extension with 3# weights x 15 reps each LE Knee flexion with blue resistive band 1x 25 reps Sitting slouch/correct x 15 reps with tactile and VC Standing step ups onto balance stones x 15with each LE leading UE support and close supervision for safety/balance Side step on/off balance beamx 15reps with standby supervision and UE support for balance/support for safety Side stepping along balance beam x 2 min. Ball between knees with hip adduction and glute sets x 15 reps Standing core with UE row each 15 reps with 10#   Patient response to treatment: Patient able to perform exercises with improved posture awareness, positioning with minimal VC and guidance. Improved right ankle and left quad control following estim.         PT  Education - 03/17/16 1128    Education provided Yes   Education Details HEP: re assessed home exercises for strengthening   Person(s) Educated Patient   Methods Explanation;Demonstration;Verbal cues   Comprehension Verbalized understanding;Returned demonstration;Verbal cues required             PT Long Term Goals - 03/01/16 1023      PT LONG TERM GOAL #1   Title Patient will be able to walk safely, erect posture with appropriate  AD on level surfaces >1000' and stairs by 03/18/2016    Baseline 02/05/2016 800' without AD  (limited to 4 min. due to fatigue, lower back pain)   Status On-going     PT LONG TERM GOAL #2   Title Patient will demonstrate improved function for daily activities with mild pain as indicated by modified oswestry score of 30% or less by 03/18/2016   Baseline MODI 62% (severe self perceived disability); MODI 12/05/15 50%; 01/12/2016 = 48%; 02/16/2016 45%   Status On-going     PT LONG TERM GOAL #3   Title Patient will demonstrate functional community ambulation and low fall rist by 10MW of 10 seconds or less by 03/18/2016   Baseline 12/09/15 11.5 seconds; 01/12/2016 deferred; 02/05/16 12.25 seconds   Status On-going     PT LONG TERM GOAL #4   Title Patient will be independent with home program for exercises and self management by 03/18/2016 to continue with progressive healing once discharged from physical therapy    Baseline 01/12/16 limited knowledge of appropriate exercises and progression and requires assistance for performing exercises and progressing intensity appropriately   Status On-going               Plan - 03/17/16 1120    Clinical Impression Statement patient demonstrated good techniques with VC and guidance of therapist. He continues with weakness in both LE's and should continue to benefit from electrical stimulation and progressive exercises to achieve goals and be able to transition to home program.    Rehab Potential Good   PT Frequency 3x / week   PT Duration 6 weeks   PT Treatment/Interventions Patient/family education;Gait training;Cryotherapy;Therapeutic activities;Therapeutic exercise;Neuromuscular re-education;Scar mobilization;Manual techniques;Moist Heat;Electrical Stimulation   PT Next Visit Plan progress exercises, core control, gait training, weight shifting; electrical stimulation left LE/RLE: re assessment /re certification   PT Home Exercise Plan core control,  LE exercises, balance/walking activities; rest for the next 1-2 days and control swelling/pain in left knee/LE      Patient will benefit from skilled therapeutic intervention in order to improve the following deficits and impairments:  Decreased strength, Decreased activity tolerance, Decreased knowledge of precautions, Pain, Decreased endurance, Decreased range of motion, Difficulty walking, Increased muscle spasms  Visit Diagnosis: Muscle weakness (generalized)  Difficulty in walking, not elsewhere classified  Pain in both knees, unspecified chronicity     Problem List Patient Active Problem List   Diagnosis Date Noted  . Cellulitis 11/27/2015  . Pedal edema 11/27/2015  . Adjustment disorder with depressed mood   . Osteoarthritis of spine with radiculopathy, lumbar region 10/15/2015  . Abnormal MRI   . Slurred speech   . Surgery, elective   . Loose stools   . OSA (obstructive sleep apnea)   . Acute blood loss anemia   . Pneumonia   . Urinary retention   . Lewy body dementia   . Encephalopathy   . Altered mental status   . Acute encephalopathy 10/12/2015  . Hypokalemia 10/12/2015  . Anemia 10/12/2015  .  S/P lumbar fusion 10/12/2015  . Acute respiratory failure (HCC)   . Degenerative disc disease, lumbar 10/01/2015  . Sciatica of right side associated with disorder of lumbosacral spine 07/21/2015  . Hypertension 02/18/2015  . Diabetes (HCC) 02/18/2015  . Gout 02/18/2015  . Hyperlipidemia 02/18/2015  . Arthritis 02/18/2015  . Encounter to establish care 02/18/2015    Beacher MayBrooks, Marie PT 03/18/2016, 12:31 PM  [facility] [facility] [address] [place], [name], [address] Phone: [phone]   Fax:  [phone]  Name: Danny StacksGary L Patterson MRN: [id number] Date of Birth: 1945/08/11

## 2016-03-24 ENCOUNTER — Encounter: Payer: PPO | Admitting: Physical Therapy

## 2016-03-26 ENCOUNTER — Ambulatory Visit
Admission: RE | Admit: 2016-03-26 | Discharge: 2016-03-26 | Disposition: A | Discharge status: Home / Self Care | Payer: PPO | Source: Ambulatory Visit | Attending: Neurosurgery | Admitting: Neurosurgery

## 2016-03-26 ENCOUNTER — Encounter: Payer: PPO | Admitting: Physical Therapy

## 2016-03-26 DIAGNOSIS — M48061 Spinal stenosis, lumbar region without neurogenic claudication: Secondary | ICD-10-CM | POA: Diagnosis not present

## 2016-03-26 DIAGNOSIS — M5416 Radiculopathy, lumbar region: Secondary | ICD-10-CM

## 2016-03-26 MED ORDER — IOPAMIDOL (ISOVUE-M 200) INJECTION 41%
15.0000 mL | Freq: Once | INTRAMUSCULAR | Status: AC
  Administered 2016-03-26: 15 mL via INTRATHECAL

## 2016-03-26 NOTE — Progress Notes (Signed)
Pt states he has been off Tramadol for the past few days.

## 2016-03-26 NOTE — Discharge Instructions (Signed)
Myelogram Discharge Instructions  1. Go home and rest quietly for the next 24 hours.  It is important to lie flat for the next 24 hours.  Get up only to go to the restroom.  You may lie in the bed or on a couch on your back, your stomach, your left side or your right side.  You may have one pillow under your head.  You may have pillows between your knees while you are on your side or under your knees while you are on your back.  2. DO NOT drive today.  Recline the seat as far back as it will go, while still wearing your seat belt, on the way home.  3. You may get up to go to the bathroom as needed.  You may sit up for 10 minutes to eat.  You may resume your normal diet and medications unless otherwise indicated.  Drink lots of extra fluids today and tomorrow.  4. The incidence of headache, nausea, or vomiting is about 5% (one in 20 patients).  If you develop a headache, lie flat and drink plenty of fluids until the headache goes away.  Caffeinated beverages may be helpful.  If you develop severe nausea and vomiting or a headache that does not go away with flat bed rest, call [phone].  5. You may resume normal activities after your 24 hours of bed rest is over; however, do not exert yourself strongly or do any heavy lifting tomorrow. If when you get up you have a headache when standing, go back to bed and force fluids for another 24 hours.  6. Call your physician for a follow-up appointment.  The results of your myelogram will be sent directly to your physician by the following day.  7. If you have any questions or if complications develop after you arrive home, please call [phone].  Discharge instructions have been explained to the patient.  The patient, or the person responsible for the patient, fully understands these instructions.       May resume Ultram on Jan. 6, 2018, after 1:00 pm.

## 2016-03-30 ENCOUNTER — Encounter: Payer: Self-pay | Admitting: Physical Therapy

## 2016-03-30 ENCOUNTER — Ambulatory Visit: Payer: PPO | Attending: Neurosurgery | Admitting: Physical Therapy

## 2016-03-30 DIAGNOSIS — M6281 Muscle weakness (generalized): Secondary | ICD-10-CM | POA: Diagnosis not present

## 2016-03-30 DIAGNOSIS — R262 Difficulty in walking, not elsewhere classified: Secondary | ICD-10-CM | POA: Diagnosis not present

## 2016-03-30 DIAGNOSIS — M25561 Pain in right knee: Secondary | ICD-10-CM

## 2016-03-30 DIAGNOSIS — M25562 Pain in left knee: Secondary | ICD-10-CM | POA: Insufficient documentation

## 2016-03-30 NOTE — Therapy (Signed)
[facility] [address], [place], [address] Phone: [phone]   Fax:  [phone]  Physical Therapy Treatment  Patient Details  Name: Danny Patterson MRN: [id number] Date of Birth: 12-02-1945 Referring Provider: Karn Cassis MD  Encounter Date: 03/30/2016      PT End of Session - 03/30/16 1012    Visit Number 46   Number of Visits 60   Date for PT Re-Evaluation 05/11/16   Authorization Type [id number]   Authorization Time Period 50 (G code)   PT Start Time 0945   PT Stop Time 1035   PT Time Calculation (min) 50 min   Activity Tolerance Patient tolerated treatment well;Patient limited by fatigue   Behavior During Therapy [facility] for tasks assessed/performed      Past Medical History:  Diagnosis Date  . Allergy   . Arthritis   . Diabetes mellitus without complication (HCC)   . Gout   . Hyperlipidemia   . Hypertension   . Kidney stone   . Rheumatic fever     Past Surgical History:  Procedure Laterality Date  . BACK SURGERY    . knot     removed from neck  . POSTERIOR LUMBAR FUSION 4 LEVEL N/A 10/01/2015   Procedure: Lumbar three-four,  Lumbar four-five,  Lumbar five-Sacrum one posterior lumbar interbody fusion with Laminotomy at Lumbar Two-Three;  Surgeon: Hilda Lias, MD;  Location: [facility];  Service: Neurosurgery;  Laterality: N/A;    There were no vitals filed for this visit.      Subjective Assessment - 03/30/16 0959    Subjective Patient reports having myelogram last week and is waiting for MD to discuss results. He is discouraged because of the continued difficulty with moving and is now having increased difficutly with getting up out of chair. He reports he is still having episodes of tripping/falling due to right ankle/foot dragging and has not wanted to seek eval for orthotic until he speaks with MD to see if his right ankle will improve.    Limitations Lifting;Standing;Walking;House  hold activities;Other (comment)   Patient Stated Goals get back to full function for work and be able to walk without AD   Currently in Pain? No/denies        Objective: Gait: ambulating without AD with forward flexion at hips,  Observation: left LE quadriceps muscle with moderate atrophy as compared to right Strength: right LE hip flexion 4-/5, abduction/ER in sitting 4/5, knee extension 4/5, flexion 4/5, ankle DF 3-/5, PF grossly 4-/5 Left LE hip flexion 3+5, hip abduction/ER in sitting 4/5, knee extension 4-/5, flexion 4-/5, ankle DF 4/5 MODI; 50% (moderate/severe self perceived disability: 0 = no self perceived disability)  Treatment: Modalities: Electrical stimulation x 20 min.: russian stim. 10/10 cycle applied (2) electrodes: left quadriceps and high volt to medial/lateral aspect left knee and russian stim. To right anterior tibialis (10/10 cycle) with intensity to tolerance/contraction with patient seated in chair with LE's elevated andperforming active quad sets with each cycle: goal: neuromuscular re education/pain  Therapeutic exercise: patient performed with VC, tactile cues and demonstration of therapist as needed  Rocker board for weight shift/ankle DF/PF with balance stones on top x 2 min. Each Knee extension with 3# weights 2 x 15 reps each LE Knee flexion with blue resistive band 2x 15 reps each LE Hip abduction/clam in sitting with manual resistance of therapist x 15 reps with hold at end ranges Ball between knees with  hip adduction and glute sets x 15 reps Sitting scapular retraction/row with assist of therapist x 20 reps  Patient response to treatment: Patient able to perform exercises without increased pain and with minimal VC. He reported decreased difficulty with sit to stand at end of session. Improved left quad control and right ankle control with estim.           PT Education - 03/30/16 1000    Education provided Yes   Education Details HEP:  continue with hip adduction, glute sets, ROM for ankles, hip abduction with resistive band; discussed safety issue of falling and that the orthotic for right ankle would help with this and if his movement/strength returned he would be able to decrease use   Person(s) Educated Patient   Methods Explanation   Comprehension Verbalized understanding             PT Long Term Goals - 03/30/16 1009      PT LONG TERM GOAL #1   Title Patient will be able to walk safely, erect posture with appropriate AD on level surfaces >1200' and stairs by 05/11/2016   Baseline 02/05/2016 800' without AD  (limited to 4 min. due to fatigue, lower back pain) 03/30/16 deferred due to weakness   Status Revised     PT LONG TERM GOAL #2   Title Patient will demonstrate improved function for daily activities with mild pain as indicated by modified oswestry score of 30% or less by 05/11/2016   Baseline MODI 62% (severe self perceived disability); MODI 12/05/15 50%; 01/12/2016 = 48%; 02/16/2016 45%; 03/30/16 50% self perceive disabiltiy   Status Revised     PT LONG TERM GOAL #3   Title Patient will demonstrate functional community ambulation and low fall rist by of 10 seconds or less by 05/11/2016   Baseline 12/09/15 11.5 seconds; 01/12/2016 deferred; 02/05/16 12.25 seconds; 03/30/2016 13 seconds   Status Revised     PT LONG TERM GOAL #4   Title Patient will be independent with home program for exercises and self management by 05/11/2016 to continue with progressive healing once discharged from physical therapy    Baseline  limited knowledge of appropriate exercises and progression and requires assistance for performing exercises and progressing intensity appropriately   Status Revised               Plan - 03/30/16 1056    Clinical Impression Statement Patient is progressing slowly as he heals from mulit level fusion of back with residual bilateral leg weakness. He is responding to estim. with improved muslc  control, contraction following treatment. He should continue to improve with addictional physical therapy intervention to address weakness and decreased endurance. He is waiting to hear from MD regarding results of myelogram.    Rehab Potential Good   Clinical Impairments Affecting Rehab Potential (+) acute condition (surgery), family support, motivated (-) chronic condition of back pain/weakness prior to sx, age   PT Frequency 2x / week   PT Duration 6 weeks   PT Treatment/Interventions Patient/family education;Gait training;Cryotherapy;Therapeutic activities;Therapeutic exercise;Neuromuscular re-education;Scar mobilization;Manual techniques;Moist Heat;Electrical Stimulation   PT Next Visit Plan progress exercises, core control, gait training, weight shifting; electrical stimulation left LE/RLE   PT Home Exercise Plan core control, LE exercises, balance/walking activities; rest for the next 1-2 days and control swelling/pain in left knee/LE   Consulted and Agree with Plan of Care Patient      Patient will benefit from skilled therapeutic intervention in order to improve the following  deficits and impairments:  Decreased strength, Decreased activity tolerance, Decreased knowledge of precautions, Pain, Decreased endurance, Decreased range of motion, Difficulty walking, Increased muscle spasms  Visit Diagnosis: Muscle weakness (generalized) - Plan: PT plan of care cert/re-cert  Difficulty in walking, not elsewhere classified - Plan: PT plan of care cert/re-cert  Pain in both knees, unspecified chronicity - Plan: PT plan of care cert/re-cert     Problem List Patient Active Problem List   Diagnosis Date Noted  . Cellulitis 11/27/2015  . Pedal edema 11/27/2015  . Adjustment disorder with depressed mood   . Osteoarthritis of spine with radiculopathy, lumbar region 10/15/2015  . Abnormal MRI   . Slurred speech   . Surgery, elective   . Loose stools   . OSA (obstructive sleep apnea)   .  Acute blood loss anemia   . Pneumonia   . Urinary retention   . Lewy body dementia   . Encephalopathy   . Altered mental status   . Acute encephalopathy 10/12/2015  . Hypokalemia 10/12/2015  . Anemia 10/12/2015  . S/P lumbar fusion 10/12/2015  . Acute respiratory failure (HCC)   . Degenerative disc disease, lumbar 10/01/2015  . Sciatica of right side associated with disorder of lumbosacral spine 07/21/2015  . Hypertension 02/18/2015  . Diabetes (HCC) 02/18/2015  . Gout 02/18/2015  . Hyperlipidemia 02/18/2015  . Arthritis 02/18/2015  . Encounter to establish care 02/18/2015    Beacher MayBrooks, Marie PT 03/30/2016, 3:41 PM  [facility] [address] , [name], [address] Phone: [phone]   Fax:  [phone]  Name: Danny Patterson MRN: [id number] Date of Birth: 12/31/1945

## 2016-04-02 ENCOUNTER — Ambulatory Visit: Payer: PPO | Admitting: Physical Therapy

## 2016-04-06 ENCOUNTER — Encounter: Payer: Self-pay | Admitting: Physical Therapy

## 2016-04-06 ENCOUNTER — Ambulatory Visit: Payer: PPO | Admitting: Physical Therapy

## 2016-04-06 DIAGNOSIS — M25561 Pain in right knee: Secondary | ICD-10-CM

## 2016-04-06 DIAGNOSIS — R262 Difficulty in walking, not elsewhere classified: Secondary | ICD-10-CM

## 2016-04-06 DIAGNOSIS — M6281 Muscle weakness (generalized): Secondary | ICD-10-CM

## 2016-04-06 DIAGNOSIS — M25562 Pain in left knee: Secondary | ICD-10-CM

## 2016-04-06 NOTE — Therapy (Signed)
[facility] [address] [place], [name], [address] Phone: [phone]   Fax:  [phone]  Physical Therapy Treatment  Patient Details  Name: Danny StacksGary L Patterson MRN: [id number] Date of Birth: [birth year] Referring Provider: Karn CassisBotero, Ernesto M. MD  Encounter Date: 04/06/2016      PT End of Session - 04/06/16 1049    Visit Number 47   Number of Visits 60   Date for PT Re-Evaluation 05/11/16   Authorization Type 47   Authorization Time Period 50 (G code)   PT Start Time 1032   PT Stop Time 1130   PT Time Calculation (min) 58 min   Activity Tolerance Patient tolerated treatment well;Patient limited by fatigue   Behavior [facility] for tasks assessed/performed      Past Medical History:  Diagnosis Date  . Allergy   . Arthritis   . Diabetes mellitus without complication (HCC)   . Gout   . Hyperlipidemia   . Hypertension   . Kidney stone   . Rheumatic fever     Past Surgical History:  Procedure Laterality Date  . BACK SURGERY    . knot     removed from neck  . POSTERIOR LUMBAR FUSION 4 LEVEL N/A 10/01/2015   Procedure: Lumbar three-four,  Lumbar four-five,  Lumbar five-Sacrum one posterior lumbar interbody fusion with Laminotomy at Lumbar Two-Three;  Surgeon: Hilda LiasErnesto Botero, MD;  Location: [facility];  Service: Neurosurgery;  Laterality: N/A;    There were no vitals filed for this visit.      Subjective Assessment - 04/06/16 1035    Subjective Patient reports he now has a new MD, Dr. Alphonzo Lemmingsabbel. He is discouraged because he doesn't know anything more from his myelogram. He is noticing some improvement with feet with improved movement intermittently   Limitations Lifting;Standing;Walking;House hold activities;Other (comment)   Patient Stated Goals get back to full function for work and be able to walk without AD   Currently in Pain? No/denies   Pain Location --      Objective: Gait: ambulating  without AD with forward flexion at hips, improved posture as compared to previous session Observation: left LE quadriceps muscle with moderate atrophy as compared to right  Treatment: Modalities: Electrical stimulation x 20 min.: russian stim. 10/10 cycle applied (2) electrodes: left quadriceps and high volt to medial/lateral aspect left knee and russian stim. To right anterior tibialis (10/10 cycle) with intensity to tolerance/contraction with patient seated in chair with LE's elevated andperforming active quad sets with each cycle: goal: neuromuscular re education/pain  Therapeutic exercise: patient performed with VC, tactile cues and demonstration of therapist as needed  Rocker board for weight shift/ankle DF/PF with balance stones on top x 2 min. Each Knee extension with 3# weights 2 x 15 reps each LE Knee flexion with blue resistive band 2x 15 reps each LE Hip abduction/clam in sitting with manual resistance of therapist x 25 reps with hold at end ranges Standing side step along balance beam x 2 min. Lateral step up onto balance beam x 15 reps leading with each LE  Patient response to treatment: patient demonstrated improved technique with exercises with minimal VC for correct alignment.  Improved motor control with repetition and cuing, following estim. Patient reported improved ability to walk with less difficulty, more erect posture and improved cadence at end of session.            PT Education - 04/06/16 1110  Education provided Yes   Education Details HEP: wear back brace when feeling tired or weak, rest as needed and keep moving throughout the day, exercise daily   Person(s) Educated Patient   Methods Explanation   Comprehension Verbalized understanding             PT Long Term Goals - 03/30/16 1009      PT LONG TERM GOAL #1   Title Patient will be able to walk safely, erect posture with appropriate AD on level surfaces >1200' and stairs by 05/11/2016    Baseline 02/05/2016 800' without AD  (limited to 4 min. due to fatigue, lower back pain) 03/30/16 deferred due to weakness   Status Revised     PT LONG TERM GOAL #2   Title Patient will demonstrate improved function for daily activities with mild pain as indicated by modified oswestry score of 30% or less by 05/11/2016   Baseline MODI 62% (severe self perceived disability); MODI 12/05/15 50%; 01/12/2016 = 48%; 02/16/2016 45%; 03/30/16 50% self perceive disabiltiy   Status Revised     PT LONG TERM GOAL #3   Title Patient will demonstrate functional community ambulation and low fall rist by of 10 seconds or less by 05/11/2016   Baseline 12/09/15 11.5 seconds; 01/12/2016 deferred; 02/05/16 12.25 seconds; 03/30/2016 13 seconds   Status Revised     PT LONG TERM GOAL #4   Title Patient will be independent with home program for exercises and self management by 05/11/2016 to continue with progressive healing once discharged from physical therapy    Baseline  limited knowledge of appropriate exercises and progression and requires assistance for performing exercises and progressing intensity appropriately   Status Revised               Plan - 04/06/16 1052    Clinical Impression Statement Patient is having less foot stiffness today and is resting more at home due to cold weather. He demonstrated improved quad control left LE, improved endurance and improved ability to walk with less difficulty following treatment session. He continues with weakness and decreased endurance and will benefit from continued physical therapy intervention to achieve goals.    Rehab Potential Good   PT Frequency 2x / week   PT Duration 6 weeks   PT Treatment/Interventions Patient/family education;Gait training;Cryotherapy;Therapeutic activities;Therapeutic exercise;Neuromuscular re-education;Scar mobilization;Manual techniques;Moist Heat;Electrical Stimulation   PT Next Visit Plan progress exercises, core control, gait  training, weight shifting; electrical stimulation left LE/RLE   PT Home Exercise Plan core control, LE exercises, balance/walking activities; rest for the next 1-2 days and control swelling/pain in left knee/LE      Patient will benefit from skilled therapeutic intervention in order to improve the following deficits and impairments:  Decreased strength, Decreased activity tolerance, Decreased knowledge of precautions, Pain, Decreased endurance, Decreased range of motion, Difficulty walking, Increased muscle spasms  Visit Diagnosis: Muscle weakness (generalized)  Difficulty in walking, not elsewhere classified  Pain in both knees, unspecified chronicity     Problem List Patient Active Problem List   Diagnosis Date Noted  . Cellulitis 11/27/2015  . Pedal edema 11/27/2015  . Adjustment disorder with depressed mood   . Osteoarthritis of spine with radiculopathy, lumbar region 10/15/2015  . Abnormal MRI   . Slurred speech   . Surgery, elective   . Loose stools   . OSA (obstructive sleep apnea)   . Acute blood loss anemia   . Pneumonia   . Urinary retention   . Lewy body  dementia   . Encephalopathy   . Altered mental status   . Acute encephalopathy 10/12/2015  . Hypokalemia 10/12/2015  . Anemia 10/12/2015  . S/P lumbar fusion 10/12/2015  . Acute respiratory failure (HCC)   . Degenerative disc disease, lumbar 10/01/2015  . Sciatica of right side associated with disorder of lumbosacral spine 07/21/2015  . Hypertension 02/18/2015  . Diabetes (HCC) 02/18/2015  . Gout 02/18/2015  . Hyperlipidemia 02/18/2015  . Arthritis 02/18/2015  . Encounter to establish care 02/18/2015    Beacher May PT 04/06/2016, 2:11 PM  [facility] [address], [place], [address] Phone: [phone]   Fax:  [phone]  Name: Danny Patterson MRN: [id number] Date of Birth: 1945-07-22

## 2016-04-08 ENCOUNTER — Ambulatory Visit: Payer: PPO | Admitting: Physical Therapy

## 2016-04-13 ENCOUNTER — Ambulatory Visit: Payer: PPO | Admitting: Physical Therapy

## 2016-04-13 ENCOUNTER — Encounter: Payer: Self-pay | Admitting: Physical Therapy

## 2016-04-13 DIAGNOSIS — I10 Essential (primary) hypertension: Secondary | ICD-10-CM | POA: Diagnosis not present

## 2016-04-13 DIAGNOSIS — M25562 Pain in left knee: Secondary | ICD-10-CM

## 2016-04-13 DIAGNOSIS — R262 Difficulty in walking, not elsewhere classified: Secondary | ICD-10-CM

## 2016-04-13 DIAGNOSIS — M6281 Muscle weakness (generalized): Secondary | ICD-10-CM | POA: Diagnosis not present

## 2016-04-13 DIAGNOSIS — Z6833 Body mass index (BMI) 33.0-33.9, adult: Secondary | ICD-10-CM | POA: Diagnosis not present

## 2016-04-13 DIAGNOSIS — M5416 Radiculopathy, lumbar region: Secondary | ICD-10-CM | POA: Diagnosis not present

## 2016-04-13 DIAGNOSIS — M25561 Pain in right knee: Secondary | ICD-10-CM

## 2016-04-14 NOTE — Therapy (Signed)
[facility] PHYS[facility] AND [facility]OR[facility] MEDICINE [address] [place], [address] Phone: [phone]   Fax:  [phone]  Physical Therapy Treatment  Patient Details  Name: Danny Patterson MRN: [id number] Date of Birth: 1945/08/26 Referring Provider: Karn Cassis MD  Encounter Date: 04/13/2016      PT End of Session - 04/13/16 1053    Visit Number 48   Number of Visits 60   Date for PT Re-Evaluation 05/11/16   Authorization Type 48   Authorization Time Period 50 (G code)   PT Start Time 1032   PT Stop Time 1120   PT Time Calculation (min) 48 min   Activity Tolerance Patient tolerated treatment well;Patient limited by fatigue   Behavior During Therapy [facility] for tasks assessed/performed      Past Medical History:  Diagnosis Date  . Allergy   . Arthritis   . Diabetes mellitus without complication (HCC)   . Gout   . Hyperlipidemia   . Hypertension   . Kidney stone   . Rheumatic fever     Past Surgical History:  Procedure Laterality Date  . BACK SURGERY    . knot     removed from neck  . POSTERIOR LUMBAR FUSION 4 LEVEL N/A 10/01/2015   Procedure: Lumbar three-four,  Lumbar four-five,  Lumbar five-Sacrum one posterior lumbar interbody fusion with Laminotomy at Lumbar Two-Three;  Surgeon: Hilda Lias, MD;  Location: [facility];  Service: Neurosurgery;  Laterality: N/A;    There were no vitals filed for this visit.      Subjective Assessment - 04/13/16 1033    Subjective Patient reports he feels about the same as last week. He did feel better and stronger following therapy session and still feels it is helping with strength/movement.    Limitations Lifting;Standing;Walking;House hold activities;Other (comment)   Patient Stated Goals get back to full function for work and be able to walk without AD   Currently in Pain? No/denies      Objective: Gait: ambulating without AD with more erect posture, mild forward flexion at  hips Observation: left LE quadriceps muscle with moderate atrophy as compared to right  Treatment: Modalities: Electrical stimulation x 20 min.: russian stim. 10/10 cycle applied (2) electrodes: left quadriceps and high volt to medial/lateral aspect left knee and russian stim. To right anterior tibialis (10/10 cycle)with intensity to tolerance/contraction with patient seated in chair with LE's elevated andperforming active quad sets with each cycle: goal: neuromuscular re education/pain  Therapeutic exercise: patient performed with VC, tactile cues and demonstration of therapist as needed  Rocker board for weight shift/ankle DF/PF with balance stones on top x 2 min. Each Knee extension with 3# weights 2 x 15 reps each LE Knee flexion with blue resistive band 2x 15 reps each LE Hip abduction/clam in sitting with manual resistance of therapist x 15 reps with hold at end ranges Standing side step along balance beam x 2 min. Lateral step up onto balance beam x 15 reps leading with each LE Scapular retraction/bilateral UE rows with resistive band sitting and single row seated x 15 reps each Seated straight arm pull downs with resistive band x 20 reps  Patient response to treatment: Patient demonstrated improved technique with exercises with minimal VC for correct alignment.  Improved motor control with repetition and cuing, following estim. Patient demonstrated improved cadence and more erect posture with less difficulty at end of session.        PT Education -  04/13/16 1051    Education provided Yes   Education Details HEP: re assessed exercises and use of back brace as needed, continue wiht daily exercises   Person(s) Educated Patient   Methods Explanation   Comprehension Verbalized understanding             PT Long Term Goals - 03/30/16 1009      PT LONG TERM GOAL #1   Title Patient will be able to walk safely, erect posture with appropriate AD on level surfaces >1200'  and stairs by 05/11/2016   Baseline 02/05/2016 800' without AD  (limited to 4 min. due to fatigue, lower back pain) 03/30/16 deferred due to weakness   Status Revised     PT LONG TERM GOAL #2   Title Patient will demonstrate improved function for daily activities with mild pain as indicated by modified oswestry score of 30% or less by 05/11/2016   Baseline MODI 62% (severe self perceived disability); MODI 12/05/15 50%; 01/12/2016 = 48%; 02/16/2016 45%; 03/30/16 50% self perceive disabiltiy   Status Revised     PT LONG TERM GOAL #3   Title Patient will demonstrate functional community ambulation and low fall rist by of 10 seconds or less by 05/11/2016   Baseline 12/09/15 11.5 seconds; 01/12/2016 deferred; 02/05/16 12.25 seconds; 03/30/2016 13 seconds   Status Revised     PT LONG TERM GOAL #4   Title Patient will be independent with home program for exercises and self management by 05/11/2016 to continue with progressive healing once discharged from physical therapy    Baseline  limited knowledge of appropriate exercises and progression and requires assistance for performing exercises and progressing intensity appropriately   Status Revised               Plan - 04/13/16 1122    Clinical Impression Statement Patient demonstrates improving strength and endurance with walking and ability to perform exercise with less assistance and improved endurance. He continues with decreased strength in both LE's and back/core musculature and will benefit from additional physical therapy intervention to achieve maximal function.     PT Frequency 2x / week   PT Duration 6 weeks   PT Treatment/Interventions Patient/family education;Gait training;Cryotherapy;Therapeutic activities;Therapeutic exercise;Neuromuscular re-education;Scar mobilization;Manual techniques;Moist Heat;Electrical Stimulation   PT Next Visit Plan progress exercises, core control, gait training, weight shifting; electrical stimulation left  LE/RLE   PT Home Exercise Plan core control, LE exercises, balance/walking activities; rest for the next 1-2 days and control swelling/pain in left knee/LE      Patient will benefit from skilled therapeutic intervention in order to improve the following deficits and impairments:  Decreased strength, Decreased activity tolerance, Decreased knowledge of precautions, Pain, Decreased endurance, Decreased range of motion, Difficulty walking, Increased muscle spasms  Visit Diagnosis: Muscle weakness (generalized)  Difficulty in walking, not elsewhere classified  Pain in both knees, unspecified chronicity     Problem List Patient Active Problem List   Diagnosis Date Noted  . Cellulitis 11/27/2015  . Pedal edema 11/27/2015  . Adjustment disorder with depressed mood   . Osteoarthritis of spine with radiculopathy, lumbar region 10/15/2015  . Abnormal MRI   . Slurred speech   . Surgery, elective   . Loose stools   . OSA (obstructive sleep apnea)   . Acute blood loss anemia   . Pneumonia   . Urinary retention   . Lewy body dementia   . Encephalopathy   . Altered mental status   . Acute encephalopathy 10/12/2015  .  Hypokalemia 10/12/2015  . Anemia 10/12/2015  . S/P lumbar fusion 10/12/2015  . Acute respiratory failure (HCC)   . Degenerative disc disease, lumbar 10/01/2015  . Sciatica of right side associated with disorder of lumbosacral spine 07/21/2015  . Hypertension 02/18/2015  . Diabetes (HCC) 02/18/2015  . Gout 02/18/2015  . Hyperlipidemia 02/18/2015  . Arthritis 02/18/2015  . Encounter to establish care 02/18/2015    Beacher MayBrooks, Breanah Faddis PT 04/14/2016, 12:32 PM  [facility] [address] [place], [name], [address] Phone: [phone]   Fax:  [phone]  Name: Danny Patterson MRN: [id number] Date of Birth: [birth year]

## 2016-04-15 ENCOUNTER — Ambulatory Visit: Payer: PPO | Admitting: Physical Therapy

## 2016-04-15 ENCOUNTER — Encounter: Payer: Self-pay | Admitting: Physical Therapy

## 2016-04-15 DIAGNOSIS — M6281 Muscle weakness (generalized): Secondary | ICD-10-CM | POA: Diagnosis not present

## 2016-04-15 DIAGNOSIS — R262 Difficulty in walking, not elsewhere classified: Secondary | ICD-10-CM

## 2016-04-15 DIAGNOSIS — M25561 Pain in right knee: Secondary | ICD-10-CM

## 2016-04-15 DIAGNOSIS — M25562 Pain in left knee: Secondary | ICD-10-CM

## 2016-04-16 NOTE — Therapy (Signed)
[facility] [place] [facility] [address] [place], [name], [address] Phone: [phone]   Fax:  [phone]  Physical Therapy Treatment  Patient Details  Name: Danny Patterson MRN: [id number] Date of Birth: 1945-10-22 Referring Provider: Karn CassisBotero, Ernesto M. MD  Encounter Date: 04/15/2016      PT End of Session - 04/15/16 1130    Visit Number 49   Number of Visits 60   Date for PT Re-Evaluation 05/11/16   Authorization Type 49   Authorization Time Period 50 (G code)   PT Start Time 1034   PT Stop Time 1124   PT Time Calculation (min) 50 min   Activity Tolerance Patient tolerated treatment well;Patient limited by fatigue   Behavior During Therapy [facility] for tasks assessed/performed      Past Medical History:  Diagnosis Date  . Allergy   . Arthritis   . Diabetes mellitus without complication (HCC)   . Gout   . Hyperlipidemia   . Hypertension   . Kidney stone   . Rheumatic fever     Past Surgical History:  Procedure Laterality Date  . BACK SURGERY    . knot     removed from neck  . POSTERIOR LUMBAR FUSION 4 LEVEL N/A 10/01/2015   Procedure: Lumbar three-four,  Lumbar four-five,  Lumbar five-Sacrum one posterior lumbar interbody fusion with Laminotomy at Lumbar Two-Three;  Surgeon: Hilda LiasErnesto Botero, MD;  Location: [facility];  Service: Neurosurgery;  Laterality: N/A;    There were no vitals filed for this visit.      Subjective Assessment - 04/15/16 1101    Subjective going for NCV and EMG studies. Continues to see improvement with physical therapy with improving strength and ability to stand and walk with less difficulty   Limitations Lifting;Standing;Walking;House hold activities;Other (comment)   Patient Stated Goals get back to full function for work and be able to walk without AD   Currently in Pain? No/denies      Objective: Gait: ambulating without AD with more erect posture, mild forward flexion at  hips Observation: left LE quadriceps muscle with moderate atrophy as compared to right  Treatment: Modalities: Electrical stimulation x 20 min.: russian stim. 10/10 cycle applied (2) electrodes: left quadriceps and high volt to medial/lateral aspect left knee and russian stim. To right anterior tibialis (10/10 cycle)with intensity to tolerance/contraction with patient seated in chair with LE's elevated andperforming active quad sets with each cycle: goal: neuromuscular re education/pain  Therapeutic exercise: patient performed with VC, tactile cues and demonstration of therapist as needed  Rocker board for weight shift/ankle DF/PF with balance stones on top x 2 min. Each Knee extension with 3# weights 2 x 15 reps each LE Knee flexion with blue resistive band 2x 25 reps each LE Hip abduction/clam in sitting with manual resistance of therapist x 15 reps with hold at end ranges Standing side step along balance beam x 2 min. Lateral step up onto balance beam + balance pad (4") x 15 reps leading with each LE Scapular retraction/bilateral UE rows with resistive band sitting (double resistive band) seated x 25 reps each Seated straight arm pull downs with resistive band x 20 reps  Patient response to treatment: Patient demonstrated improved endurance with minimal rest between exercises, improved motor control with standing exercises with improved LE control, placement. Improved quad contraction, quad setting left LE with estim. Minimal VC required for correct technique and alignment of LE's/trunk for most exercises  PT Education - 04/15/16 1138    Education provided Yes   Education Details HEP reassessed exercises to perform and resting if needed, use back brace as needed    Person(s) Educated Patient   Methods Explanation   Comprehension Verbalized understanding             PT Long Term Goals - 03/30/16 1009      PT LONG TERM GOAL #1   Title Patient will be able to  walk safely, erect posture with appropriate AD on level surfaces >1200' and stairs by 05/11/2016   Baseline 02/05/2016 800' without AD  (limited to 4 min. due to fatigue, lower back pain) 03/30/16 deferred due to weakness   Status Revised     PT LONG TERM GOAL #2   Title Patient will demonstrate improved function for daily activities with mild pain as indicated by modified oswestry score of 30% or less by 05/11/2016   Baseline MODI 62% (severe self perceived disability); MODI 12/05/15 50%; 01/12/2016 = 48%; 02/16/2016 45%; 03/30/16 50% self perceive disabiltiy   Status Revised     PT LONG TERM GOAL #3   Title Patient will demonstrate functional community ambulation and low fall rist by of 10 seconds or less by 05/11/2016   Baseline 12/09/15 11.5 seconds; 01/12/2016 deferred; 02/05/16 12.25 seconds; 03/30/2016 13 seconds   Status Revised     PT LONG TERM GOAL #4   Title Patient will be independent with home program for exercises and self management by 05/11/2016 to continue with progressive healing once discharged from physical therapy    Baseline  limited knowledge of appropriate exercises and progression and requires assistance for performing exercises and progressing intensity appropriately   Status Revised               Plan - 04/15/16 1132    Clinical Impression Statement Patient demosntrates improving strength and endurance with exercises and walking as demonstrated by improved motor control, balance and incresaed intensity of exercises. He is responding well to estim. and should continue to improve function, ability to walk with continued physical therapy intervention.    Rehab Potential Good   PT Frequency 2x / week   PT Duration 6 weeks   PT Treatment/Interventions Patient/family education;Gait training;Cryotherapy;Therapeutic activities;Therapeutic exercise;Neuromuscular re-education;Scar mobilization;Manual techniques;Moist Heat;Electrical Stimulation   PT Next Visit Plan  progress exercises, core control, gait training, weight shifting; electrical stimulation left LE/RLE   PT Home Exercise Plan core control, LE exercises, balance/walking activities; rest for the next 1-2 days and control swelling/pain in left knee/LE      Patient will benefit from skilled therapeutic intervention in order to improve the following deficits and impairments:  Decreased strength, Decreased activity tolerance, Decreased knowledge of precautions, Pain, Decreased endurance, Decreased range of motion, Difficulty walking, Increased muscle spasms  Visit Diagnosis: Muscle weakness (generalized)  Difficulty in walking, not elsewhere classified  Pain in both knees, unspecified chronicity     Problem List Patient Active Problem List   Diagnosis Date Noted  . Cellulitis 11/27/2015  . Pedal edema 11/27/2015  . Adjustment disorder with depressed mood   . Osteoarthritis of spine with radiculopathy, lumbar region 10/15/2015  . Abnormal MRI   . Slurred speech   . Surgery, elective   . Loose stools   . OSA (obstructive sleep apnea)   . Acute blood loss anemia   . Pneumonia   . Urinary retention   . Lewy body dementia   . Encephalopathy   . Altered mental  status   . Acute encephalopathy 10/12/2015  . Hypokalemia 10/12/2015  . Anemia 10/12/2015  . S/P lumbar fusion 10/12/2015  . Acute respiratory failure (HCC)   . Degenerative disc disease, lumbar 10/01/2015  . Sciatica of right side associated with disorder of lumbosacral spine 07/21/2015  . Hypertension 02/18/2015  . Diabetes (HCC) 02/18/2015  . Gout 02/18/2015  . Hyperlipidemia 02/18/2015  . Arthritis 02/18/2015  . Encounter to establish care 02/18/2015    Beacher May PT 04/16/2016, 12:29 PM  [name] PHYSICAL AND SPORTS MEDICINE [address] [place], [address] Phone: [phone]   Fax:  [phone]  Name: Danny Patterson MRN: [id number] Date of Birth:  [birth year]

## 2016-04-19 DIAGNOSIS — M5416 Radiculopathy, lumbar region: Secondary | ICD-10-CM | POA: Diagnosis not present

## 2016-04-19 DIAGNOSIS — G629 Polyneuropathy, unspecified: Secondary | ICD-10-CM | POA: Diagnosis not present

## 2016-04-20 ENCOUNTER — Ambulatory Visit: Payer: PPO | Admitting: Physical Therapy

## 2016-04-22 ENCOUNTER — Ambulatory Visit: Payer: PPO | Attending: Neurosurgery | Admitting: Physical Therapy

## 2016-04-22 ENCOUNTER — Encounter: Payer: Self-pay | Admitting: Physical Therapy

## 2016-04-22 DIAGNOSIS — M25561 Pain in right knee: Secondary | ICD-10-CM | POA: Diagnosis not present

## 2016-04-22 DIAGNOSIS — M6281 Muscle weakness (generalized): Secondary | ICD-10-CM | POA: Diagnosis not present

## 2016-04-22 DIAGNOSIS — M25562 Pain in left knee: Secondary | ICD-10-CM | POA: Diagnosis not present

## 2016-04-22 DIAGNOSIS — R262 Difficulty in walking, not elsewhere classified: Secondary | ICD-10-CM | POA: Diagnosis not present

## 2016-04-22 NOTE — Therapy (Signed)
[facility] [address], [place], [address] Phone: [phone]   Fax:  [phone]  Physical Therapy Treatment  Patient Details  Name: Danny Patterson MRN: [id number] Date of Birth: January 31, 1946 Referring Provider: Karn Cassis MD  Encounter Date: 04/22/2016      PT End of Session - 04/22/16 1034    Visit Number 50   Number of Visits 60   Date for PT Re-Evaluation 05/11/16   Authorization Type 50   Authorization Time Period 50 (G code)   PT Start Time 1031   PT Stop Time 1140   PT Time Calculation (min) 69 min   Activity Tolerance Patient tolerated treatment well;Patient limited by fatigue   Behavior During Therapy [facility] for tasks assessed/performed      Past Medical History:  Diagnosis Date  . Allergy   . Arthritis   . Diabetes mellitus without complication (HCC)   . Gout   . Hyperlipidemia   . Hypertension   . Kidney stone   . Rheumatic fever     Past Surgical History:  Procedure Laterality Date  . BACK SURGERY    . knot     removed from neck  . POSTERIOR LUMBAR FUSION 4 LEVEL N/A 10/01/2015   Procedure: Lumbar three-four,  Lumbar four-five,  Lumbar five-Sacrum one posterior lumbar interbody fusion with Laminotomy at Lumbar Two-Three;  Surgeon: Hilda Lias, MD;  Location: [facility];  Service: Neurosurgery;  Laterality: N/A;    There were no vitals filed for this visit.      Subjective Assessment - 04/22/16 1030    Subjective Patient reports he had NCV and EMG studies this week and has not heard anything back for results. He feels he is doing well with therapy however he feels he can improve further.     Limitations Lifting;Standing;Walking;House hold activities;Other (comment)   Patient Stated Goals get back to full function for work and be able to walk without AD   Currently in Pain? No/denies      Objective: Gait: independent without AD, decreased trunk rotation, forward  flexed at hips, reports feet feel like a sock is in the toe part of his shoes Observation: left LE quadriceps muscle with moderate atrophy as compared to right  Treatment: Modalities: Electrical stimulation x 20 min.: russian stim. 10/10 cycle applied (2) electrodes: left quadriceps and high volt to medial/lateral aspect left knee and russian stim. To right anterior tibialis (10/10 cycle)with intensity to tolerance/contraction with patient seated in chair with LE's elevated andperforming active quad sets with each cycle: goal: neuromuscular re education/pain  Therapeutic exercise: patient performed with VC, tactile cues and demonstration of therapist as needed  Rocker board for weight shift/ankle DF/PF with balance stones on top x 2 min. Each Knee extension with 4# weights 2 x 15 reps each LE Knee flexion with blue resistive band 2x 25 reps each LE Hip abduction/clam in sitting with manual resistance of therapist x 15 reps with hold at end ranges Standing side step along balance beam x 2 min. Lateral step up onto balance beam + balance pad (4") x 15 reps leading with each LE Scapular retraction/bilateral UE rows with resistive band sitting (double resistive band) seated x 25 reps each Seated straight arm pull downs with resistive band x 20 reps  Patient response to treatment: Patient demonstrated improved endurance with minimal rest periods between exercises, fatigue and pain limited right lateral step ups. Patient  Improved quad contraction  left LE and right ankle DF and ability to perform exercises following estim.            PT Education - 04/22/16 1110    Education provided Yes   Education Details HEP to continue for strength and balance and added stnadnign side stepping with resistive band.    Person(s) Educated Patient   Methods Explanation;Demonstration;Verbal cues   Comprehension Verbalized understanding;Returned demonstration;Verbal cues required             PT  Long Term Goals - 03/30/16 1009      PT LONG TERM GOAL #1   Title Patient will be able to walk safely, erect posture with appropriate AD on level surfaces >1200' and stairs by 05/11/2016   Baseline 02/05/2016 800' without AD  (limited to 4 min. due to fatigue, lower back pain) 03/30/16 deferred due to weakness   Status Revised     PT LONG TERM GOAL #2   Title Patient will demonstrate improved function for daily activities with mild pain as indicated by modified oswestry score of 30% or less by 05/11/2016   Baseline MODI 62% (severe self perceived disability); MODI 12/05/15 50%; 01/12/2016 = 48%; 02/16/2016 45%; 03/30/16 50% self perceive disabiltiy   Status Revised     PT LONG TERM GOAL #3   Title Patient will demonstrate functional community ambulation and low fall rist by of 10 seconds or less by 05/11/2016   Baseline 12/09/15 11.5 seconds; 01/12/2016 deferred; 02/05/16 12.25 seconds; 03/30/2016 13 seconds   Status Revised     PT LONG TERM GOAL #4   Title Patient will be independent with home program for exercises and self management by 05/11/2016 to continue with progressive healing once discharged from physical therapy    Baseline  limited knowledge of appropriate exercises and progression and requires assistance for performing exercises and progressing intensity appropriately   Status Revised               Plan - 04/22/16 1051    Clinical Impression Statement Patient demonstrates improvement with right ankle DF. He continues with decreased strength and confidence with walking due to neuropathies in LE's. He will benefit from continued physical therpay intervention to address limitations and achieve maximal gains.    Rehab Potential Good   PT Frequency 2x / week   PT Duration 6 weeks   PT Treatment/Interventions Patient/family education;Gait training;Cryotherapy;Therapeutic activities;Therapeutic exercise;Neuromuscular re-education;Scar mobilization;Manual techniques;Moist  Heat;Electrical Stimulation   PT Next Visit Plan progress exercises, core control, gait training, weight shifting; electrical stimulation left LE/RLE   PT Home Exercise Plan core control, LE exercises, balance/walking activities; rest for the next 1-2 days and control swelling/pain in left knee/LE      Patient will benefit from skilled therapeutic intervention in order to improve the following deficits and impairments:  Decreased strength, Decreased activity tolerance, Decreased knowledge of precautions, Pain, Decreased endurance, Decreased range of motion, Difficulty walking, Increased muscle spasms  Visit Diagnosis: Muscle weakness (generalized)  Difficulty in walking, not elsewhere classified  Pain in both knees, unspecified chronicity     Problem List Patient Active Problem List   Diagnosis Date Noted  . Cellulitis 11/27/2015  . Pedal edema 11/27/2015  . Adjustment disorder with depressed mood   . Osteoarthritis of spine with radiculopathy, lumbar region 10/15/2015  . Abnormal MRI   . Slurred speech   . Surgery, elective   . Loose stools   . OSA (obstructive sleep apnea)   . Acute blood loss anemia   .  Pneumonia   . Urinary retention   . Lewy body dementia   . Encephalopathy   . Altered mental status   . Acute encephalopathy 10/12/2015  . Hypokalemia 10/12/2015  . Anemia 10/12/2015  . S/P lumbar fusion 10/12/2015  . Acute respiratory failure (HCC)   . Degenerative disc disease, lumbar 10/01/2015  . Sciatica of right side associated with disorder of lumbosacral spine 07/21/2015  . Hypertension 02/18/2015  . Diabetes (HCC) 02/18/2015  . Gout 02/18/2015  . Hyperlipidemia 02/18/2015  . Arthritis 02/18/2015  . Encounter to establish care 02/18/2015    Beacher MayBrooks, Marie PT  04/22/2016, 1:10 PM  [place] [facility] [address] [place], [name], [address] Phone: [phone]   Fax:  [phone]  Name: Danny Patterson MRN: [id number] Date of Birth: [birth year]

## 2016-04-27 ENCOUNTER — Ambulatory Visit: Payer: PPO | Admitting: Physical Therapy

## 2016-04-27 ENCOUNTER — Encounter: Payer: Self-pay | Admitting: Physical Therapy

## 2016-04-27 DIAGNOSIS — R262 Difficulty in walking, not elsewhere classified: Secondary | ICD-10-CM

## 2016-04-27 DIAGNOSIS — M6281 Muscle weakness (generalized): Secondary | ICD-10-CM

## 2016-04-27 DIAGNOSIS — M25561 Pain in right knee: Secondary | ICD-10-CM

## 2016-04-27 DIAGNOSIS — M25562 Pain in left knee: Secondary | ICD-10-CM

## 2016-04-27 NOTE — Therapy (Signed)
[facility] [address] [place], [address] Phone: [phone]   Fax:  [phone]  Physical Therapy Treatment  Patient Details  Name: Danny Patterson MRN: [id number] Date of Birth: 05-07-1945 Referring Provider: Karn Cassis MD  Encounter Date: 04/27/2016      PT End of Session - 04/27/16 1047    Visit Number 51   Number of Visits 60   Date for PT Re-Evaluation 05/11/16   Authorization Type [id number]   Authorization Time Period 60 (G code)   PT Start Time 1033   PT Stop Time 1130   PT Time Calculation (min) 57 min   Activity Tolerance Patient tolerated treatment well;Patient limited by fatigue   Behavior During Therapy [facility] for tasks assessed/performed      Past Medical History:  Diagnosis Date  . Allergy   . Arthritis   . Diabetes mellitus without complication (HCC)   . Gout   . Hyperlipidemia   . Hypertension   . Kidney stone   . Rheumatic fever     Past Surgical History:  Procedure Laterality Date  . BACK SURGERY    . knot     removed from neck  . POSTERIOR LUMBAR FUSION 4 LEVEL N/A 10/01/2015   Procedure: Lumbar three-four,  Lumbar four-five,  Lumbar five-Sacrum one posterior lumbar interbody fusion with Laminotomy at Lumbar Two-Three;  Surgeon: Hilda Lias, MD;  Location: [facility];  Service: Neurosurgery;  Laterality: N/A;    There were no vitals filed for this visit.      Subjective Assessment - 04/27/16 1043    Subjective Still has not heard anything about results from studies done last week. No new symptoms and no falls reported since previous session. today having increased left knee pain.    Limitations Lifting;Standing;Walking;House hold activities;Other (comment)   Patient Stated Goals get back to full function for work and be able to walk without AD   Currently in Pain? Yes   Pain Score 5    Pain Location Knee   Pain Orientation Left   Pain Descriptors / Indicators  Shooting;Other (Comment)  electric like feeling   Pain Type Acute pain   Pain Onset More than a month ago      Objective: Gait: without AD, forward flexed at hips, knees, decreased trunk rotation, decreased hip/knee flexion/extension throughout cycle Observation: left LE quadriceps muscle with moderate atrophy as compared to right with improved muscle bulk noted left LE as compared to previous session Strength: left LE: hip flexion 3-/5, knee extension 4-/5, knee flexion 4/5 Right LE: hip fleixon 4-/5, knee extension 4-/5, knee flexion 4-/5, ankle DF 3-/5  Treatment: Modalities: Electrical stimulation x 20 min.: russian stim. 10/10 cycle applied (2) electrodes: left quadriceps and high volt to medial/lateral aspect left knee and russian stim. To right anterior tibialis (10/10 cycle)with intensity to tolerance/contraction with patient seated in chair with LE's elevated andperforming active quad sets with each cycle: goal: neuromuscular re education/pain; no adverse reactions noted  Therapeutic exercise: patient performed with VC, tactile cues and demonstration of therapist as needed  Knee extension with 4# weights 2 x 15 reps each LE Knee flexion with double blue resistive band 2x 20 -25 reps each LE right LE only able to perform 15 for second set Hip abduction/clam in sitting with manual resistance of therapist x 15 reps with hold at end ranges Standing side step along balance beam x 2 min.with blue resistive band around thighs  Lateral step up onto balance beam with hip flexion to as close to 90 degrees as able, then tap beam and repeat 90 degrees flexion  x 5 reps leading with each LE; using both UE's for support when standing on right LE and performing hip flexion with left LE Forward step ups onto 4" airex pads leading x 10 with each LE with patient using UE's for support/balance and with close supervision of therapist for safety Scapular retraction/bilateral UE rows with resistive  band sitting (double resistive band)seated x 20 reps each Seated straight arm pull downs with resistive band x 20 reps Single row with doubled resistive band x 20 reps each UE with patient seated on treatment table  Patient response to treatment: Patient required close supervision for safety and use of UE's for balance/ maintain stability with standing/walking activities. Mild to moderate fatigue in LE's noted at end of session. Improved quad control and strength noted with seated LAQ with less effort noted from previous sessions and increased intensity with knee flexion with doubled resistive band. Improved quad contraction left LE and right ankle DF following estim. No increased left knee pain reported during treatment session           PT Education - 04/27/16 1046    Education provided Yes   Education Details HEP re assessed band aroung thighs, balance exercises, forward and backward walking   Person(s) Educated Patient   Methods Explanation;Demonstration;Verbal cues   Comprehension Verbalized understanding;Returned demonstration;Verbal cues required             PT Long Term Goals - 03/30/16 1009      PT LONG TERM GOAL #1   Title Patient will be able to walk safely, erect posture with appropriate AD on level surfaces >1200' and stairs by 05/11/2016   Baseline 02/05/2016 800' without AD  (limited to 4 min. due to fatigue, lower back pain) 03/30/16 deferred due to weakness   Status Revised     PT LONG TERM GOAL #2   Title Patient will demonstrate improved function for daily activities with mild pain as indicated by modified oswestry score of 30% or less by 05/11/2016   Baseline MODI 62% (severe self perceived disability); MODI 12/05/15 50%; 01/12/2016 = 48%; 02/16/2016 45%; 03/30/16 50% self perceive disabiltiy   Status Revised     PT LONG TERM GOAL #3   Title Patient will demonstrate functional community ambulation and low fall rist by of 10 seconds or less by 05/11/2016    Baseline 12/09/15 11.5 seconds; 01/12/2016 deferred; 02/05/16 12.25 seconds; 03/30/2016 13 seconds   Status Revised     PT LONG TERM GOAL #4   Title Patient will be independent with home program for exercises and self management by 05/11/2016 to continue with progressive healing once discharged from physical therapy    Baseline  limited knowledge of appropriate exercises and progression and requires assistance for performing exercises and progressing intensity appropriately   Status Revised               Plan - 04/27/16 1130    Clinical Impression Statement Patient demonstrates improvement with left LE strength in quadriceps with estim. with good carry over into exercises and between sessions. He continues with decreased endurance, decreased hip flexor strength left LE and decreased function with daily activties and wll therefore benefit from physical therapy intervention to achieve goals.    Rehab Potential Good   PT Frequency 2x / week   PT Duration 6 weeks  PT Treatment/Interventions Patient/family education;Gait training;Cryotherapy;Therapeutic activities;Therapeutic exercise;Neuromuscular re-education;Scar mobilization;Manual techniques;Moist Heat;Electrical Stimulation   PT Next Visit Plan progress exercises, core control, gait training, weight shifting; electrical stimulation left LE/RLE   PT Home Exercise Plan core control, LE exercises, balance/walking activities; rest for the next 1-2 days and control swelling/pain in left knee/LE      Patient will benefit from skilled therapeutic intervention in order to improve the following deficits and impairments:  Decreased strength, Decreased activity tolerance, Decreased knowledge of precautions, Pain, Decreased endurance, Decreased range of motion, Difficulty walking, Increased muscle spasms  Visit Diagnosis: Muscle weakness (generalized)  Difficulty in walking, not elsewhere classified  Pain in both knees, unspecified  chronicity     Problem List Patient Active Problem List   Diagnosis Date Noted  . Cellulitis 11/27/2015  . Pedal edema 11/27/2015  . Adjustment disorder with depressed mood   . Osteoarthritis of spine with radiculopathy, lumbar region 10/15/2015  . Abnormal MRI   . Slurred speech   . Surgery, elective   . Loose stools   . OSA (obstructive sleep apnea)   . Acute blood loss anemia   . Pneumonia   . Urinary retention   . Lewy body dementia   . Encephalopathy   . Altered mental status   . Acute encephalopathy 10/12/2015  . Hypokalemia 10/12/2015  . Anemia 10/12/2015  . S/P lumbar fusion 10/12/2015  . Acute respiratory failure (HCC)   . Degenerative disc disease, lumbar 10/01/2015  . Sciatica of right side associated with disorder of lumbosacral spine 07/21/2015  . Hypertension 02/18/2015  . Diabetes (HCC) 02/18/2015  . Gout 02/18/2015  . Hyperlipidemia 02/18/2015  . Arthritis 02/18/2015  . Encounter to establish care 02/18/2015    Beacher MayBrooks, Stark Aguinaga PT 04/27/2016, 5:24 PM  [place] [facility] [address] [place], [name], [id number] Phone: [phone]   Fax:  [phone]  Name: Danny Patterson MRN: [id number] Date of Birth: April 24, 1945

## 2016-04-28 DIAGNOSIS — E669 Obesity, unspecified: Secondary | ICD-10-CM | POA: Insufficient documentation

## 2016-04-28 DIAGNOSIS — I10 Essential (primary) hypertension: Secondary | ICD-10-CM | POA: Diagnosis not present

## 2016-04-28 DIAGNOSIS — Z6833 Body mass index (BMI) 33.0-33.9, adult: Secondary | ICD-10-CM | POA: Diagnosis not present

## 2016-04-28 DIAGNOSIS — G629 Polyneuropathy, unspecified: Secondary | ICD-10-CM | POA: Diagnosis not present

## 2016-04-29 ENCOUNTER — Ambulatory Visit: Payer: PPO | Admitting: Physical Therapy

## 2016-04-30 ENCOUNTER — Ambulatory Visit: Payer: PPO | Admitting: Physical Therapy

## 2016-04-30 ENCOUNTER — Encounter: Payer: PPO | Admitting: Physical Therapy

## 2016-04-30 ENCOUNTER — Encounter: Payer: Self-pay | Admitting: Physical Therapy

## 2016-04-30 DIAGNOSIS — M25561 Pain in right knee: Secondary | ICD-10-CM

## 2016-04-30 DIAGNOSIS — M6281 Muscle weakness (generalized): Secondary | ICD-10-CM

## 2016-04-30 DIAGNOSIS — M25562 Pain in left knee: Secondary | ICD-10-CM

## 2016-04-30 DIAGNOSIS — R262 Difficulty in walking, not elsewhere classified: Secondary | ICD-10-CM

## 2016-05-01 NOTE — Therapy (Signed)
[place] [facility] [address] [place], [address] Phone: [phone]   Fax:  [phone]  Physical Therapy Treatment  Patient Details  Name: Danny Patterson MRN: [id number] Date of Birth: 12-23-1945 Referring Provider: Karn Cassis MD  Encounter Date: 04/30/2016      PT End of Session - 04/30/16 1032    Visit Number 52   Number of Visits 60   Date for PT Re-Evaluation 05/11/16   Authorization Type 52   Authorization Time Period 60 (G code)   PT Start Time 0930   PT Stop Time 1030   PT Time Calculation (min) 60 min   Activity Tolerance Patient tolerated treatment well;Patient limited by fatigue   Behavior During Therapy [facility] for tasks assessed/performed      Past Medical History:  Diagnosis Date  . Allergy   . Arthritis   . Diabetes mellitus without complication (HCC)   . Gout   . Hyperlipidemia   . Hypertension   . Kidney stone   . Rheumatic fever     Past Surgical History:  Procedure Laterality Date  . BACK SURGERY    . knot     removed from neck  . POSTERIOR LUMBAR FUSION 4 LEVEL N/A 10/01/2015   Procedure: Lumbar three-four,  Lumbar four-five,  Lumbar five-Sacrum one posterior lumbar interbody fusion with Laminotomy at Lumbar Two-Three;  Surgeon: Hilda Lias, MD;  Location: [facility];  Service: Neurosurgery;  Laterality: N/A;    There were no vitals filed for this visit.      Subjective Assessment - 04/30/16 0940    Subjective Discouraged about healing and time it is taking. negative results for nerve testing per patient report from follow up with MD. Arrived early to therapy due to not looking at schedule.    Limitations Lifting;Standing;Walking;House hold activities;Other (comment)   Patient Stated Goals get back to full function for work and be able to walk without AD   Currently in Pain? No/denies      Objective: Gait: without AD, forward flexed at hips, knees, decreased  trunk rotation, decreased hip/knee flexion/extension throughout cycle Observation: left LE quadriceps muscle with moderate atrophy as compared to right with improved muscle bulk noted left LE as compared to previous session  Treatment: Modalities: Electrical stimulation x 20 min.: russian stim. 10/10 cycle applied (2) electrodes: left quadriceps and high volt to medial/lateral aspect left knee and russian stim. To right anterior tibialis (10/10 cycle)with intensity to tolerance/contraction with patient seated in chair with LE's elevated andperforming active quad sets with each cycle: goal: neuromuscular re education/pain; no adverse reactions noted  Therapeutic exercise: patient performed with VC, tactile cues and demonstration of therapist as needed  Knee extension with 4# weights 2 x 15 reps each LE Knee flexion with double blue resistive band 2x 20 reps each LE right LE only able to perform 15 for second set Hip abduction/clam in sitting with manual resistance of therapist x 15 reps with hold at end ranges Standing side step along balance beam x 2 min. Lateral step up onto balance beam with hip flexion to as close to 90 degrees as able, then tap beam and repeat 90 degrees flexion  x 5 reps leading with each LE; using both UE's for support when standing on right LE and performing hip flexion with left LE Forward step ups onto 4" airex pads leading x 10 with each LE with patient using UE's for support/balance and with close  supervision of therapist for safety Scapular retraction/bilateral UE rows with resistive band sitting (double resistive band)seated x 20 reps each Single row with doubled resistive band x 20 reps each UE with patient seated on treatment table  Patient response to treatment: Patient able to perform exercises with good technique with minimal VC and assistance. Mild fatigue noted following session. Improved motor control with repetition of exercises with VC; required close  supervision for safety with standing and walking exercises.         PT Education - 04/30/16 1030    Education provided Yes   Education Details Discussed healing and the need to progress with exercises as tolerated   Person(s) Educated Patient   Methods Explanation;Demonstration;Verbal cues   Comprehension Verbalized understanding;Returned demonstration;Verbal cues required             PT Long Term Goals - 03/30/16 1009      PT LONG TERM GOAL #1   Title Patient will be able to walk safely, erect posture with appropriate AD on level surfaces >1200' and stairs by 05/11/2016   Baseline 02/05/2016 800' without AD  (limited to 4 min. due to fatigue, lower back pain) 03/30/16 deferred due to weakness   Status Revised     PT LONG TERM GOAL #2   Title Patient will demonstrate improved function for daily activities with mild pain as indicated by modified oswestry score of 30% or less by 05/11/2016   Baseline MODI 62% (severe self perceived disability); MODI 12/05/15 50%; 01/12/2016 = 48%; 02/16/2016 45%; 03/30/16 50% self perceive disabiltiy   Status Revised     PT LONG TERM GOAL #3   Title Patient will demonstrate functional community ambulation and low fall rist by 10MW of 10 seconds or less by 05/11/2016   Baseline 12/09/15 11.5 seconds; 01/12/2016 deferred; 02/05/16 12.25 seconds; 03/30/2016 13 seconds   Status Revised     PT LONG TERM GOAL #4   Title Patient will be independent with home program for exercises and self management by 05/11/2016 to continue with progressive healing once discharged from physical therapy    Baseline  limited knowledge of appropriate exercises and progression and requires assistance for performing exercises and progressing intensity appropriately   Status Revised               Plan - 04/30/16 1033    Clinical Impression Statement Patient is progressing slowly and is responding steadily to current treament. He continues with limitaiotns of pain, weakness  as he heals from spinal fusion multiple levels and should continue to improve with additional physical therapy.    Rehab Potential Good   PT Frequency 2x / week   PT Duration 6 weeks   PT Treatment/Interventions Patient/family education;Gait training;Cryotherapy;Therapeutic activities;Therapeutic exercise;Neuromuscular re-education;Scar mobilization;Manual techniques;Moist Heat;Electrical Stimulation   PT Next Visit Plan progress exercises, core control, gait training, weight shifting; electrical stimulation left LE/RLE   PT Home Exercise Plan core control, LE exercises, balance/walking activities; rest for the next 1-2 days and control swelling/pain in left knee/LE      Patient will benefit from skilled therapeutic intervention in order to improve the following deficits and impairments:  Decreased strength, Decreased activity tolerance, Decreased knowledge of precautions, Pain, Decreased endurance, Decreased range of motion, Difficulty walking, Increased muscle spasms  Visit Diagnosis: Muscle weakness (generalized)  Difficulty in walking, not elsewhere classified  Pain in both knees, unspecified chronicity     Problem List Patient Active Problem List   Diagnosis Date Noted  . Cellulitis 11/27/2015  .  Pedal edema 11/27/2015  . Adjustment disorder with depressed mood   . Osteoarthritis of spine with radiculopathy, lumbar region 10/15/2015  . Abnormal MRI   . Slurred speech   . Surgery, elective   . Loose stools   . OSA (obstructive sleep apnea)   . Acute blood loss anemia   . Pneumonia   . Urinary retention   . Lewy body dementia   . Encephalopathy   . Altered mental status   . Acute encephalopathy 10/12/2015  . Hypokalemia 10/12/2015  . Anemia 10/12/2015  . S/P lumbar fusion 10/12/2015  . Acute respiratory failure (HCC)   . Degenerative disc disease, lumbar 10/01/2015  . Sciatica of right side associated with disorder of lumbosacral spine 07/21/2015  . Hypertension  02/18/2015  . Diabetes (HCC) 02/18/2015  . Gout 02/18/2015  . Hyperlipidemia 02/18/2015  . Arthritis 02/18/2015  . Encounter to establish care 02/18/2015    Beacher May PT 05/01/2016, 11:46 AM  [place] [facility] PHYSICAL AND SPORTS MEDICINE [address] [place], [address] Phone: [phone]   Fax:  [phone]  Name: Danny Patterson MRN: [id number] Date of Birth: June 01, 1945

## 2016-05-03 ENCOUNTER — Encounter: Payer: Self-pay | Admitting: *Deleted

## 2016-05-04 ENCOUNTER — Encounter: Payer: Self-pay | Admitting: Diagnostic Neuroimaging

## 2016-05-04 ENCOUNTER — Ambulatory Visit (INDEPENDENT_AMBULATORY_CARE_PROVIDER_SITE_OTHER): Payer: PPO | Admitting: Diagnostic Neuroimaging

## 2016-05-04 ENCOUNTER — Ambulatory Visit: Payer: PPO | Admitting: Physical Therapy

## 2016-05-04 VITALS — BP 152/83 | HR 67 | Ht 70.0 in | Wt 247.4 lb

## 2016-05-04 DIAGNOSIS — G6281 Critical illness polyneuropathy: Secondary | ICD-10-CM

## 2016-05-04 DIAGNOSIS — M5416 Radiculopathy, lumbar region: Secondary | ICD-10-CM

## 2016-05-04 DIAGNOSIS — G629 Polyneuropathy, unspecified: Secondary | ICD-10-CM

## 2016-05-04 NOTE — Progress Notes (Signed)
[facility]  PATIENT: Danny Patterson DOB: October 22, 1945  REFERRING CLINICIAN: Franky Macho, K HISTORY FROM: patient and wife  REASON FOR VISIT: new consult    HISTORICAL  CHIEF COMPLAINT:  Chief Complaint  Patient presents with  . Polyneuropathy    rm 6, New Pt,  wife- [name], " burning, tingling , numbness of left knee to foot ever since back surgery 09/2015"    HISTORY OF PRESENT ILLNESS:   71 year old male here for evaluation of lower extremity numbness and tingling.  Patient has complex history including lumbar spine surgery in February 2000 by Dr. Jeral Fruit. Patient does not recall details but reviewing records patient was having low back pain and bilateral lower extremity leg pain. Apparently symptoms improved after surgery although he had an MRI scan of October 2000 which showed recurrence of symptoms. At some point over the next few years his symptoms had resolved and patient was able to be active with work and his day-to-day life.  Patient has history of diabetes which has been under better control lately. In fact with weight loss and diet he has been able to come off of his diabetes medications. Hemoglobin A1c in August 2017 was 5.3.  In July 2017 patient was having increasing balance and gait difficulty. He was starting to develop a "right foot drop" and had a second lumbar spine surgery on 10/01/15. Postoperatively he developed respiratory failure, progressive altered mental status and hypotension, requiring intubation. He then developed septic shock related to pneumonia and demand ischemia. Ultimately he was able to be extubated but continued to have agitation confusion. Neurology was consulted in the hospital and MRI of the brain showed a small left frontoparietal focus of restricted diffusion, possibly an acute to subacute ischemic infarction. Neurology consulted also raise possibility of underlying neurodegenerative process such as dementia with Lewy bodies based on  family report of preoperative memory loss, shuffling gait, hallucinations.  Following patient's transition to rehabilitation and then home, patient noticed that he was having more numbness and tingling in his feet. He feels like he is wearing several pairs of socks. He is having more problems with balance and walking. He reports that some of these symptoms have progressively worsened after his lumbar spine surgery in July 2017.  Based on progression of symptoms after surgery patient followed up with neurosurgery clinic, had follow-up testing including EMG nerve conduction study, CT myelogram, and was diagnosed with both generalized peripheral neuropathy as well as unremarkable CT myelogram findings. Therefore patient referred to our office for evaluation of neuropathy and symptoms.    REVIEW OF SYSTEMS: Full 14 system review of systems performed and negative with exception of: Swelling in legs hearing loss joint pain aching muscles numbness weakness.  ALLERGIES: Allergies  Allergen Reactions  . Ace Inhibitors Other (See Comments)    Angioedema.    HOME MEDICATIONS: Outpatient Medications Prior to Visit  Medication Sig Dispense Refill  . acetaminophen (TYLENOL) 325 MG tablet Take 2 tablets (650 mg total) by mouth 4 (four) times daily -  with meals and at bedtime.    Marland Kitchen amLODipine (NORVASC) 5 MG tablet Take 1 tablet (5 mg total) by mouth daily. Take 1 tablet daily 30 tablet 6  . atorvastatin (LIPITOR) 20 MG tablet Take 1 tablet (20 mg total) by mouth daily at 6 PM. 30 tablet 12  . furosemide (LASIX) 20 MG tablet Take 2 tablets each AM for swelling 60 tablet 6  . gabapentin (NEURONTIN) 100 MG capsule Take 100 mg by mouth  3 (three) times daily.    Marland Kitchen KLOR-CON M20 20 MEQ tablet TAKE 1 TABLET (20 MEQ TOTAL) BY MOUTH DAILY. 30 tablet 6  . magnesium gluconate (MAGONATE) 500 MG tablet Take 500 mg by mouth daily.    . methocarbamol (ROBAXIN) 500 MG tablet TAKE 1 TABLET (500 MG TOTAL) BY MOUTH 3  (THREE) TIMES DAILY. FOR MUSCLE ACHES/SPASMS 30 tablet 2  . naproxen sodium (ANAPROX) 220 MG tablet Take 220 mg by mouth 2 (two) times daily with a meal.    . tamsulosin (FLOMAX) 0.4 MG CAPS capsule TAKE 1 CAPSULE (0.4 MG TOTAL) BY MOUTH DAILY. 30 capsule 6  . traMADol (ULTRAM) 50 MG tablet Take 50 mg by mouth every 6 (six) hours as needed.     No facility-administered medications prior to visit.     PAST MEDICAL HISTORY: Past Medical History:  Diagnosis Date  . Allergy   . Arthritis   . Diabetes mellitus without complication (HCC)   . Gout   . Hyperlipidemia   . Hypertension   . Kidney stone   . Rheumatic fever     PAST SURGICAL HISTORY: Past Surgical History:  Procedure Laterality Date  . BACK SURGERY    . knot     removed from neck  . POSTERIOR LUMBAR FUSION 4 LEVEL N/A 10/01/2015   Procedure: Lumbar three-four,  Lumbar four-five,  Lumbar five-Sacrum one posterior lumbar interbody fusion with Laminotomy at Lumbar Two-Three;  Surgeon: Hilda Lias, MD;  Location: [facility];  Service: Neurosurgery;  Laterality: N/A;    FAMILY HISTORY: Family History  Problem Relation Age of Onset  . Parkinson's disease Father 55  . Kidney disease Maternal Grandmother   . Hypertension Sister     SOCIAL HISTORY:  Social History   Social History  . Marital status: Married    Spouse name: Patty  . Number of children: 3  . Years of education: 14   Occupational History  .      self employed   Social History Main Topics  . Smoking status: Former Smoker    Types: Cigarettes  . Smokeless tobacco: Never Used  . Alcohol use 1.8 oz/week    3 Cans of beer per week     Comment: 05/04/16 3 beers daily  . Drug use: No  . Sexual activity: Not on file   Other Topics Concern  . Not on file   Social History Narrative   Lives with wife   Caffeine- coffee, 2 cups daily     PHYSICAL EXAM  GENERAL EXAM/CONSTITUTIONAL: Vitals:  Vitals:   05/04/16 1203  BP: (!) 152/83  Pulse:  67  Weight: 247 lb 6.4 oz (112.2 kg)  Height: 5\' 10"  (1.778 m)     Body mass index is 35.5 kg/m.  Visual Acuity Screening   Right eye Left eye Both eyes  Without correction: 20/30 20/20   With correction:        Patient is in no distress; well developed, nourished and groomed; neck is supple  CARDIOVASCULAR:  Examination of carotid arteries is normal; no carotid bruits  Regular rate and rhythm, no murmurs  Examination of peripheral vascular system by observation and palpation is normal  EYES:  Ophthalmoscopic exam of optic discs and posterior segments is normal; no papilledema or hemorrhages  MUSCULOSKELETAL:  Gait, strength, tone, movements noted in Neurologic exam below  NEUROLOGIC: MENTAL STATUS:  No flowsheet data found.  awake, alert, oriented to person, place and time  recent and remote memory  intact  normal attention and concentration  language fluent, comprehension intact, naming intact,   fund of knowledge appropriate  CRANIAL NERVE:   2nd - no papilledema on fundoscopic exam  2nd, 3rd, 4th, 6th - pupils equal and reactive to light, visual fields full to confrontation, extraocular muscles intact, no nystagmus  5th - facial sensation symmetric  7th - facial strength symmetric  8th - hearing intact  9th - palate elevates symmetrically, uvula midline  11th - shoulder shrug symmetric  12th - tongue protrusion midline  MOTOR:   normal bulk and tone, full strength in the BUE, BLE  EXCEPT BILATERAL FOOT DORSIFLEXION WEAKNESS (RIGHT 3+, LEFT 4) AND BILATERAL EHL 2-3  SENSORY:   normal and symmetric to light touch, temperature, vibration  ABSENT PP IN FEET AND ANKLES; DECR PP IN FINGERS  ABSENT VIB IN TOES AND ANKLES  COORDINATION:   finger-nose-finger, fine finger movements normal  FINGER TAPPING   REFLEXES:   deep tendon reflexes TRACE and symmetric  ABSENT REFLEXES AT ANKLES  GAIT/STATION:   STOOPED POSTURE; SMALL SHORT  STEPS; ANTALGIC GAIT; UNSTEADY    DIAGNOSTIC DATA (LABS, IMAGING, TESTING) - I reviewed patient records, labs, notes, testing and imaging myself where available.  Lab Results  Component Value Date   WBC 7.3 11/06/2015   HGB 11.1 (L) 11/06/2015   HCT 34.3 (L) 11/06/2015   MCV 89.3 11/06/2015   PLT 189 11/06/2015      Component Value Date/Time   NA 139 11/06/2015 1548   NA 141 02/26/2015 1404   K 4.8 11/06/2015 1548   CL 103 11/06/2015 1548   CO2 26 11/06/2015 1548   GLUCOSE 80 11/06/2015 1548   BUN 20 11/06/2015 1548   BUN 22 02/26/2015 1404   CREATININE 1.08 11/06/2015 1548   CALCIUM 9.4 11/06/2015 1548   PROT 6.4 11/06/2015 1548   PROT 6.6 02/26/2015 1404   ALBUMIN 3.7 11/06/2015 1548   ALBUMIN 4.4 02/26/2015 1404   AST 19 11/06/2015 1548   ALT 12 11/06/2015 1548   ALKPHOS 98 11/06/2015 1548   BILITOT 0.4 11/06/2015 1548   BILITOT 0.5 02/26/2015 1404   GFRNONAA 69 11/06/2015 1548   GFRAA 80 11/06/2015 1548   Lab Results  Component Value Date   CHOL 127 02/26/2015   HDL 35 (L) 02/26/2015   LDLCALC 62 02/26/2015   TRIG 150 (H) 02/26/2015   CHOLHDL 3.6 02/26/2015   Lab Results  Component Value Date   HGBA1C 5.3 11/06/2015   No results found for: VITAMINB12 Lab Results  Component Value Date   TSH 2.580 02/26/2015    04/19/16 EMG/NCS [I reviewed report and data myself and agree with interpretation. 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 In addition there are severe anterior osteophytes and bone bridging at T11, T12, L1 and L3, L4, L5. -VRP]  1. Interval L3-S1 fusion. Loosening of both L3 pedicle screws. No solid interbody osseous fusion at L3-4 or L5-S1. 2. Mild  lateral recess stenosis at L2-3, improved from prior. Moderate left foraminal stenosis. 3. Improved lateral recess patency at L4-5 and L5-S1. 4. Moderate biforaminal stenosis at L3-4, L4-5, and L5-S1. 5. 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 6. Aortic atherosclerosis.     ASSESSMENT AND PLAN  71 y.o. year old  male here with significant lumbar degenerative spine disease status post surgery 2, with recent surgery in July 2017 complicated by postoperative sepsis and critical illness, with postoperative numbness, tingling, neuropathy symptoms confirmed on electrodiagnostic testing. Patient also has history of diabetes which is well controlled now. I suspect patient developed critical illness neuropathy during his hospitalization following surgery. We'll check neuropathy labs to look for alternate causes of polyneuropathy. I agree that no lumbar spine surgical treatment options are likely to help improve patient's symptoms.  Ddx: peripheral neuropathy (likely critical illness neuropathy + diabetes or other cause) + lumbar radiculopathy / spinal stenosis + ? neurodegenerative dz (? Dementia with lewy bodies)  1. Neuropathy (HCC)   2. Lumbar radiculitis   3. Critical illness neuropathy (HCC)      PLAN: - check neuropathy labs - continue physical therapy - supportive care  Orders Placed This Encounter  Procedures  . Neuropathy Panel   Return in about 3 months (around 08/01/2016).  I reviewed images, labs, notes, records myself. I summarized findings and reviewed with patient, for this high risk condition (critical illness neuropathy, fall risk, possible dementia) requiring high complexity decision making.  I spent 60 minutes of face to face time with patient. Greater than 50% of time was spent in counseling and coordination of care with patient. In summary we discussed above findings and plan.     Suanne MarkerVIKRAM R. Elice Crigger, MD 05/04/2016, 12:49 PM Certified in Neurology,  Neurophysiology and Neuroimaging  [facility] [address], [address] [place], [name] [address] [phone]

## 2016-05-06 ENCOUNTER — Encounter: Payer: Self-pay | Admitting: Physical Therapy

## 2016-05-06 ENCOUNTER — Ambulatory Visit: Payer: PPO | Admitting: Physical Therapy

## 2016-05-06 DIAGNOSIS — M25562 Pain in left knee: Secondary | ICD-10-CM

## 2016-05-06 DIAGNOSIS — M6281 Muscle weakness (generalized): Secondary | ICD-10-CM | POA: Diagnosis not present

## 2016-05-06 DIAGNOSIS — M25561 Pain in right knee: Secondary | ICD-10-CM

## 2016-05-06 DIAGNOSIS — R262 Difficulty in walking, not elsewhere classified: Secondary | ICD-10-CM

## 2016-05-07 ENCOUNTER — Ambulatory Visit: Payer: PPO | Admitting: Physical Therapy

## 2016-05-07 ENCOUNTER — Telehealth: Payer: Self-pay | Admitting: *Deleted

## 2016-05-07 ENCOUNTER — Encounter: Payer: Self-pay | Admitting: Physical Therapy

## 2016-05-07 DIAGNOSIS — M25561 Pain in right knee: Secondary | ICD-10-CM

## 2016-05-07 DIAGNOSIS — M6281 Muscle weakness (generalized): Secondary | ICD-10-CM

## 2016-05-07 DIAGNOSIS — M25562 Pain in left knee: Secondary | ICD-10-CM

## 2016-05-07 DIAGNOSIS — R262 Difficulty in walking, not elsewhere classified: Secondary | ICD-10-CM

## 2016-05-07 LAB — NEUROPATHY PANEL
A/G RATIO SPE: 1.4 (ref 0.7–1.7)
ALPHA 2: 0.8 g/dL (ref 0.4–1.0)
ANA: NEGATIVE
Albumin ELP: 4 g/dL (ref 2.9–4.4)
Alpha 1: 0.2 g/dL (ref 0.0–0.4)
Angio Convert Enzyme: 57 U/L (ref 14–82)
BETA: 1.1 g/dL (ref 0.7–1.3)
GAMMA GLOBULIN: 0.7 g/dL (ref 0.4–1.8)
GLOBULIN, TOTAL: 2.9 g/dL (ref 2.2–3.9)
Rhuematoid fact SerPl-aCnc: <10 IU/mL (ref 0.0–13.9)
Sed Rate: 4 mm/hr (ref 0–30)
TSH: 2.55 u[IU]/mL (ref 0.450–4.500)
Total Protein: 6.9 g/dL (ref 6.0–8.5)
VIT D 25 HYDROXY: 19.3 ng/mL — AB (ref 30.0–100.0)
Vitamin B-12: 311 pg/mL (ref 232–1245)

## 2016-05-07 NOTE — Telephone Encounter (Signed)
Spoke with patient and informed him that some labs are still pending. Informed him his thyroid came back normal, however his Vit D level is low. Patient does not take MVI. Advised he buy OTC Vit D and take 1000 units daily. Patient verbalized understanding, agreement to buy vitamin D and appreciation of call.

## 2016-05-07 NOTE — Therapy (Signed)
[place] [facility] [address], [place], [address] Phone: [phone]   Fax:  [phone]  Physical Therapy Treatment  Patient Details  Name: Danny Patterson MRN: [id number] Date of Birth: 1946-03-02 Referring Provider: Karn Cassis MD  Encounter Date: 05/06/2016      PT End of Session - 05/06/16 1023    Visit Number [id number]   Number of Visits 60   Date for PT Re-Evaluation 05/11/16   Authorization Type 53   Authorization Time Period 60 (G code)   PT Start Time [phone]   PT Stop Time 1038   PT Time Calculation (min) 51 min   Activity Tolerance Patient tolerated treatment well;Patient limited by fatigue   Behavior During Therapy [facility] for tasks assessed/performed      Past Medical History:  Diagnosis Date  . Allergy   . Arthritis   . Diabetes mellitus without complication (HCC)   . Gout   . Hyperlipidemia   . Hypertension   . Kidney stone   . Rheumatic fever     Past Surgical History:  Procedure Laterality Date  . BACK SURGERY    . knot     removed from neck  . POSTERIOR LUMBAR FUSION 4 LEVEL N/A 10/01/2015   Procedure: Lumbar three-four,  Lumbar four-five,  Lumbar five-Sacrum one posterior lumbar interbody fusion with Laminotomy at Lumbar Two-Three;  Surgeon: Hilda Lias, MD;  Location: [facility];  Service: Neurosurgery;  Laterality: N/A;    There were no vitals filed for this visit.      Subjective Assessment - 05/06/16 1021    Subjective Patient reports he is now being worked up for possible other contributing factors for slow progress and continued weakness.    Limitations Lifting;Standing;Walking;House hold activities;Other (comment)   Patient Stated Goals get back to full function for work and be able to walk without AD   Currently in Pain? No/denies     Objective: Gait: ambulating independently with mild forward flexion at hips, more erect posture from previous session, decreased  hip/knee flexion with swing phases and decreased hip extension with push off bilaterally Observation: left LE quadriceps muscle with moderate atrophy as compared to right with improving muscle bulk noted, bilateral mild pitting edema present lower legs: patient to contact family MD regarding this  Treatment: Modalities: Electrical stimulation x 20 min.: russian stim. 10/10 cycle applied (2) electrodes: left quadriceps and high volt to medial/lateral aspect left knee and russian stim. To right anterior tibialis (10/10 cycle)with intensity to tolerance/contraction with patient seated in chair with LE's elevated andperforming active quad sets with each cycle: goal: neuromuscular re education/pain; no adverse reactions noted  Therapeutic exercise: patient performed with VC, tactile cues and demonstration of therapist as needed  Knee extension with 4# weights 2 x 15 reps each LE Knee flexion with double blue resistive band 2x 20  reps each LE right LE  Hip abduction/clam in sitting with manual resistance of therapist x 15 reps with hold at end ranges Standing side step along balance beam x 2 min. Lateral step up onto balance beam with hip flexion to as close to 90 degrees as able, then tap beam and repeat 90 degrees flexion  x 8-10 reps leading with each LE; using both UE's for support when standing on right LE and performing hip flexion with left LE Walk along balance stones x 10 reps with using counter for support/safety and with close supervision of therapist Scapular retraction/bilateral UE  rows with resistive band sitting (double resistive band)seated x 20 reps each Seated straight arm pull downs with resistive band x 20 reps  Patient response to treatment: Patient required close supervision for safety with all standing/walking exercises. He demonstrated good technique with exercises with minimal cuing and able to perform with improving endurance without resting between exercises. Improved  quad control and decreased left knee pain following estim.           PT Education - 05/06/16 1040    Education provided Yes   Education Details HEP re assessed to continue with strengthening, balance    Person(s) Educated Patient   Methods Explanation   Comprehension Verbalized understanding             PT Long Term Goals - 03/30/16 1009      PT LONG TERM GOAL #1   Title Patient will be able to walk safely, erect posture with appropriate AD on level surfaces >1200' and stairs by 05/11/2016   Baseline 02/05/2016 800' without AD  (limited to 4 min. due to fatigue, lower back pain) 03/30/16 deferred due to weakness   Status Revised     PT LONG TERM GOAL #2   Title Patient will demonstrate improved function for daily activities with mild pain as indicated by modified oswestry score of 30% or less by 05/11/2016   Baseline MODI 62% (severe self perceived disability); MODI 12/05/15 50%; 01/12/2016 = 48%; 02/16/2016 45%; 03/30/16 50% self perceive disabiltiy   Status Revised     PT LONG TERM GOAL #3   Title Patient will demonstrate functional community ambulation and low fall rist by 10MW of 10 seconds or less by 05/11/2016   Baseline 12/09/15 11.5 seconds; 01/12/2016 deferred; 02/05/16 12.25 seconds; 03/30/2016 13 seconds   Status Revised     PT LONG TERM GOAL #4   Title Patient will be independent with home program for exercises and self management by 05/11/2016 to continue with progressive healing once discharged from physical therapy    Baseline  limited knowledge of appropriate exercises and progression and requires assistance for performing exercises and progressing intensity appropriately   Status Revised               Plan - 05/06/16 1040    Clinical Impression Statement Patient is progressing steadily with strength and abiltiy to stand and walk with less difficulty with current treatment. He continues with limitations of pain, decreased strength in core and LE's s/p multi  level spinal fusion with subsequent bilateral LE weakness and he should continue to improve with additional physical therapy intervention to address limitations.     Rehab Potential Good   PT Frequency 2x / week   PT Duration 6 weeks   PT Treatment/Interventions Patient/family education;Gait training;Cryotherapy;Therapeutic activities;Therapeutic exercise;Neuromuscular re-education;Scar mobilization;Manual techniques;Moist Heat;Electrical Stimulation   PT Next Visit Plan progress exercises, core control, gait training, weight shifting; electrical stimulation left LE/RLE   PT Home Exercise Plan walking, strengthening, ROM exercises as instructed      Patient will benefit from skilled therapeutic intervention in order to improve the following deficits and impairments:  Decreased strength, Decreased activity tolerance, Decreased knowledge of precautions, Pain, Decreased endurance, Decreased range of motion, Difficulty walking, Increased muscle spasms  Visit Diagnosis: Muscle weakness (generalized)  Difficulty in walking, not elsewhere classified  Pain in both knees, unspecified chronicity     Problem List Patient Active Problem List   Diagnosis Date Noted  . Cellulitis 11/27/2015  . Pedal edema 11/27/2015  .  Adjustment disorder with depressed mood   . Osteoarthritis of spine with radiculopathy, lumbar region 10/15/2015  . Abnormal MRI   . Slurred speech   . Surgery, elective   . Loose stools   . OSA (obstructive sleep apnea)   . Acute blood loss anemia   . Pneumonia   . Urinary retention   . Lewy body dementia   . Encephalopathy   . Altered mental status   . Acute encephalopathy 10/12/2015  . Hypokalemia 10/12/2015  . Anemia 10/12/2015  . S/P lumbar fusion 10/12/2015  . Acute respiratory failure (HCC)   . Degenerative disc disease, lumbar 10/01/2015  . Sciatica of right side associated with disorder of lumbosacral spine 07/21/2015  . Hypertension 02/18/2015  . Diabetes  (HCC) 02/18/2015  . Gout 02/18/2015  . Hyperlipidemia 02/18/2015  . Arthritis 02/18/2015  . Encounter to establish care 02/18/2015    Beacher May PT 05/07/2016, 10:32 AM  [facility] PH[facility] SP[facility]IC[facility] [address], [place], [address] Phone: [phone]   Fax:  [phone]  Name: Danny Patterson MRN: [id number] Date of Birth: Sep 07, 1945

## 2016-05-10 ENCOUNTER — Encounter: Payer: Self-pay | Admitting: Family Medicine

## 2016-05-10 ENCOUNTER — Ambulatory Visit: Payer: PPO | Admitting: Physical Therapy

## 2016-05-10 ENCOUNTER — Ambulatory Visit (INDEPENDENT_AMBULATORY_CARE_PROVIDER_SITE_OTHER): Payer: PPO | Admitting: Family Medicine

## 2016-05-10 ENCOUNTER — Encounter: Payer: Self-pay | Admitting: Physical Therapy

## 2016-05-10 VITALS — BP 140/80 | HR 71 | Temp 97.6°F | Resp 16 | Ht 70.0 in | Wt 242.0 lb

## 2016-05-10 DIAGNOSIS — I10 Essential (primary) hypertension: Secondary | ICD-10-CM | POA: Diagnosis not present

## 2016-05-10 DIAGNOSIS — E785 Hyperlipidemia, unspecified: Secondary | ICD-10-CM | POA: Diagnosis not present

## 2016-05-10 DIAGNOSIS — R600 Localized edema: Secondary | ICD-10-CM | POA: Diagnosis not present

## 2016-05-10 DIAGNOSIS — M539 Dorsopathy, unspecified: Secondary | ICD-10-CM | POA: Diagnosis not present

## 2016-05-10 DIAGNOSIS — M25562 Pain in left knee: Secondary | ICD-10-CM

## 2016-05-10 DIAGNOSIS — E0800 Diabetes mellitus due to underlying condition with hyperosmolarity without nonketotic hyperglycemic-hyperosmolar coma (NKHHC): Secondary | ICD-10-CM

## 2016-05-10 DIAGNOSIS — G629 Polyneuropathy, unspecified: Secondary | ICD-10-CM | POA: Diagnosis not present

## 2016-05-10 DIAGNOSIS — M6281 Muscle weakness (generalized): Secondary | ICD-10-CM

## 2016-05-10 DIAGNOSIS — M5136 Other intervertebral disc degeneration, lumbar region: Secondary | ICD-10-CM

## 2016-05-10 DIAGNOSIS — M25561 Pain in right knee: Secondary | ICD-10-CM

## 2016-05-10 DIAGNOSIS — R262 Difficulty in walking, not elsewhere classified: Secondary | ICD-10-CM

## 2016-05-10 DIAGNOSIS — M5387 Other specified dorsopathies, lumbosacral region: Secondary | ICD-10-CM

## 2016-05-10 DIAGNOSIS — G6281 Critical illness polyneuropathy: Secondary | ICD-10-CM | POA: Insufficient documentation

## 2016-05-10 MED ORDER — GABAPENTIN 100 MG PO CAPS: 100.0000 mg | ORAL_CAPSULE | Freq: Three times a day (TID) | ORAL | 12 refills | Status: DC

## 2016-05-10 NOTE — Therapy (Signed)
[facility] [address] [place], [address] Phone: [phone]   Fax:  [phone]  Physical Therapy Treatment  Patient Details  Name: Danny Patterson MRN: [id number] Date of Birth: 02-Jan-1946 Referring Provider: Karn Cassis MD  Encounter Date: 05/07/2016      PT End of Session - 05/07/16 1300   Visit Number 54   Number of Visits 60   Date for PT Re-Evaluation 05/11/16   Authorization Type [id number]   Authorization Time Period 34 (G code)   PT Start Time PT Stop Time PT Time Calculation (min.) 1215 1307 52 min.    Equipment Utilized During Treatment Back brace   Activity Tolerance Patient tolerated treatment well;Patient limited by fatigue   Behavior During Therapy [facility] for tasks assessed/performed      Past Medical History:  Diagnosis Date  . Allergy   . Arthritis   . Diabetes mellitus without complication (HCC)   . Gout   . Hyperlipidemia   . Hypertension   . Kidney stone   . Rheumatic fever     Past Surgical History:  Procedure Laterality Date  . BACK SURGERY    . knot     removed from neck  . POSTERIOR LUMBAR FUSION 4 LEVEL N/A 10/01/2015   Procedure: Lumbar three-four,  Lumbar four-five,  Lumbar five-Sacrum one posterior lumbar interbody fusion with Laminotomy at Lumbar Two-Three;  Surgeon: Hilda Lias, MD;  Location: [facility];  Service: Neurosurgery;  Laterality: N/A;    There were no vitals filed for this visit.      Subjective Assessment - 05/07/16  1304   Subjective Patient reports his back pain is less and he feels he is moving better since previous session yesterday.   Limitations Lifting;Standing;Walking;House hold activities;Other (comment)   Patient Stated Goals get back to full function for work and be able to walk without AD   Currently in Pain? No/denies      Objective:  Treatment: Modalities: Electrical stimulation x 20 min.: russian stim. 10/10 cycle  applied (2) electrodes: left quadriceps and high volt to medial/lateral aspect left knee and russian stim. To right anterior tibialis (10/10 cycle)with intensity to tolerance/contraction with patient seated in chair with LE's elevated andperforming active quad sets with each cycle: goal: neuromuscular re education/pain; no adverse reactions noted  Therapeutic exercise: patient performed with VC, tactile cues and demonstration of therapist as needed  Instructed in balance system for weight shifting forward and back and side to side: demonstrated by therapist and then performed by patient 2 min. Each direction with VC  And assistance for correct foot placement and technique Performed limits of stability low level x 2 with platform static and then unlocked to #10 to offer increased challenge to balance/stability (used bilateral UE support for balance/safety throughout exercise) Standing side step along balance beam x 2 min. Lateral step up onto balance beam with hip flexion to as close to 90 degrees as able, then tap beam and repeat 90 degrees flexion x 8-10 reps leading with each LE; using both UE's for support when standing on right LE and performing hip flexion with left LE  Patient response to treatment: Patient demonstrated improved gait pattern with improved stability following balance system exercises today. He had mild fatigue in trunk/back with prolonged standing exercises. He required close supervision for safety with all exercises. Improved quadriceps control and able to perform right ankle DF with greater ease following estim.  PT Education - 05/07/16  1300   Education provided Yes   Education Details Instructed in balance system prior to performing; continue with HEP   Person(s) Educated Patient   Methods Explanation;Demonstration   Comprehension Verbalized understanding; Returned demonstration; verbal cues             PT Long Term Goals - 03/30/16 1009      PT LONG  TERM GOAL #1   Title Patient will be able to walk safely, erect posture with appropriate AD on level surfaces >1200' and stairs by 05/11/2016   Baseline 02/05/2016 800' without AD  (limited to 4 min. due to fatigue, lower back pain) 03/30/16 deferred due to weakness   Status Revised     PT LONG TERM GOAL #2   Title Patient will demonstrate improved function for daily activities with mild pain as indicated by modified oswestry score of 30% or less by 05/11/2016   Baseline MODI 62% (severe self perceived disability); MODI 12/05/15 50%; 01/12/2016 = 48%; 02/16/2016 45%; 03/30/16 50% self perceive disabiltiy   Status Revised     PT LONG TERM GOAL #3   Title Patient will demonstrate functional community ambulation and low fall rist by 10MW of 10 seconds or less by 05/11/2016   Baseline 12/09/15 11.5 seconds; 01/12/2016 deferred; 02/05/16 12.25 seconds; 03/30/2016 13 seconds   Status Revised     PT LONG TERM GOAL #4   Title Patient will be independent with home program for exercises and self management by 05/11/2016 to continue with progressive healing once discharged from physical therapy    Baseline  limited knowledge of appropriate exercises and progression and requires assistance for performing exercises and progressing intensity appropriately   Status Revised               Plan - 05/07/16 1315    Clinical Impression Statement Patient continues to demonstrate improvement with strength and endurance slowly s/p multi level fusion with bilateral LE weakness and core weakness. He demonstrated improved posture and core control with improved gait pattern following exercises on balance system. He should continue to improve with additional physical therapy intervention to address limitations.    Rehab Potential Good   PT Frequency 2x / week   PT Duration 6 weeks   PT Treatment/Interventions Patient/family education;Gait training;Cryotherapy;Therapeutic activities;Therapeutic exercise;Neuromuscular  re-education;Scar mobilization;Manual techniques;Moist Heat;Electrical Stimulation   PT Next Visit Plan progress exercises, core control, gait training, weight shifting; electrical stimulation left LE/RLE   PT Home Exercise Plan walking, strengthening, ROM exercises as instructed      Patient will benefit from skilled therapeutic intervention in order to improve the following deficits and impairments:  Decreased strength, Decreased activity tolerance, Decreased knowledge of precautions, Pain, Decreased endurance, Decreased range of motion, Difficulty walking, Increased muscle spasms  Visit Diagnosis: Muscle weakness (generalized)  Difficulty in walking, not elsewhere classified  Pain in both knees, unspecified chronicity     Problem List Patient Active Problem List   Diagnosis Date Noted  . Peripheral polyneuropathy (HCC) 05/10/2016  . Body mass index (bmi) 33.0-33.9, adult 04/28/2016  . Cellulitis 11/27/2015  . Pedal edema 11/27/2015  . Adjustment disorder with depressed mood   . Osteoarthritis of spine with radiculopathy, lumbar region 10/15/2015  . Abnormal MRI   . Slurred speech   . Surgery, elective   . Loose stools   . OSA (obstructive sleep apnea)   . Acute blood loss anemia   . Pneumonia   . Urinary retention   . Lewy body  dementia   . Encephalopathy   . Altered mental status   . Acute encephalopathy 10/12/2015  . Hypokalemia 10/12/2015  . Anemia 10/12/2015  . S/P lumbar fusion 10/12/2015  . Acute respiratory failure (HCC)   . Degenerative disc disease, lumbar 10/01/2015  . Sciatica of right side associated with disorder of lumbosacral spine 07/21/2015  . Hypertension 02/18/2015  . Diabetes (HCC) 02/18/2015  . Gout 02/18/2015  . Hyperlipidemia 02/18/2015  . Arthritis 02/18/2015  . Encounter to establish care 02/18/2015    Beacher May PT 05/10/2016, 12:22 PM  [place] [facility] [address] [place], [address] Phone: [phone]   Fax:  [phone]  Name: EURAL HOLZSCHUH MRN: [id number] Date of Birth: September 24, 1945

## 2016-05-10 NOTE — Progress Notes (Signed)
Name: Danny Patterson   MRN: [id number]    DOB: 02-27-1946   Date:05/10/2016       Progress Note  Subjective  Chief Complaint  Chief Complaint  Patient presents with  . Hypertension  . Peripheral Neuropathy    HPI Here for f/u of HBP and R foot problems.  He has seen Neurologist in [place] last week and potential dx of peripheral neuropathy.  Gabapentin  Ha helped his foot a little (taking 100 mg bid).  No problem-specific Assessment & Plan notes found for this encounter.   Past Medical History:  Diagnosis Date  . Allergy   . Arthritis   . Diabetes mellitus without complication (HCC)   . Gout   . Hyperlipidemia   . Hypertension   . Kidney stone   . Rheumatic fever     Past Surgical History:  Procedure Laterality Date  . BACK SURGERY    . knot     removed from neck  . POSTERIOR LUMBAR FUSION 4 LEVEL N/A 10/01/2015   Procedure: Lumbar three-four,  Lumbar four-five,  Lumbar five-Sacrum one posterior lumbar interbody fusion with Laminotomy at Lumbar Two-Three;  Surgeon: Hilda LiasErnesto Botero, MD;  Location: [facility];  Service: Neurosurgery;  Laterality: N/A;    Family History  Problem Relation Age of Onset  . Parkinson's disease Father 8068  . Kidney disease Maternal Grandmother   . Hypertension Sister     Social History   Social History  . Marital status: Married    Spouse name: Patty  . Number of children: 3  . Years of education: 14   Occupational History  .      self employed   Social History Main Topics  . Smoking status: Former Smoker    Types: Cigarettes  . Smokeless tobacco: Never Used  . Alcohol use 1.8 oz/week    3 Cans of beer per week     Comment: 05/04/16 3 beers daily  . Drug use: No  . Sexual activity: Not on file   Other Topics Concern  . Not on file   Social History Narrative   Lives with wife   Caffeine- coffee, 2 cups daily     Current Outpatient Prescriptions:  .  acetaminophen (TYLENOL) 325 MG tablet, Take 2 tablets (650 mg  total) by mouth 4 (four) times daily -  with meals and at bedtime., Disp: , Rfl:  .  amLODipine (NORVASC) 5 MG tablet, Take 1 tablet (5 mg total) by mouth daily. Take 1 tablet daily, Disp: 30 tablet, Rfl: 6 .  atorvastatin (LIPITOR) 20 MG tablet, Take 1 tablet (20 mg total) by mouth daily at 6 PM., Disp: 30 tablet, Rfl: 12 .  furosemide (LASIX) 20 MG tablet, Take 2 tablets each AM for swelling, Disp: 60 tablet, Rfl: 6 .  gabapentin (NEURONTIN) 100 MG capsule, Take 1 capsule (100 mg total) by mouth 3 (three) times daily., Disp: 90 capsule, Rfl: 12 .  KLOR-CON M20 20 MEQ tablet, TAKE 1 TABLET (20 MEQ TOTAL) BY MOUTH DAILY., Disp: 30 tablet, Rfl: 6 .  magnesium gluconate (MAGONATE) 500 MG tablet, Take 500 mg by mouth daily., Disp: , Rfl:  .  methocarbamol (ROBAXIN) 500 MG tablet, TAKE 1 TABLET (500 MG TOTAL) BY MOUTH 3 (THREE) TIMES DAILY. FOR MUSCLE ACHES/SPASMS, Disp: 30 tablet, Rfl: 2 .  naproxen sodium (ANAPROX) 220 MG tablet, Take 220 mg by mouth 2 (two) times daily with a meal., Disp: , Rfl:  .  tamsulosin (FLOMAX) 0.4 MG  CAPS capsule, TAKE 1 CAPSULE (0.4 MG TOTAL) BY MOUTH DAILY., Disp: 30 capsule, Rfl: 6 .  traMADol (ULTRAM) 50 MG tablet, Take 50 mg by mouth every 6 (six) hours as needed., Disp: , Rfl:   Allergies  Allergen Reactions  . Ace Inhibitors Other (See Comments)    Angioedema.     Review of Systems  Constitutional: Negative for chills, fever, malaise/fatigue and weight loss.  HENT: Negative for hearing loss and tinnitus.   Eyes: Negative for blurred vision and double vision.  Respiratory: Negative for cough, shortness of breath and wheezing.   Cardiovascular: Negative for chest pain, palpitations and leg swelling.  Gastrointestinal: Negative for abdominal pain, blood in stool and heartburn.  Genitourinary: Negative for dysuria, frequency and urgency.  Musculoskeletal: Negative for joint pain and myalgias.  Skin: Negative for rash.  Neurological: Positive for sensory  change. Negative for dizziness, tingling, tremors, weakness and headaches.      Objective  Vitals:   05/10/16 1110 05/10/16 1214  BP: (!) 141/72 140/80  Pulse: 71   Resp: 16   Temp: 97.6 F (36.4 C)   TempSrc: Oral   Weight: 242 lb (109.8 kg)   Height: 5\' 10"  (1.778 m)     Physical Exam  Constitutional: He is oriented to person, place, and time and well-developed, well-nourished, and in no distress. No distress.  HENT:  Head: Normocephalic and atraumatic.  Eyes: Conjunctivae and EOM are normal. Pupils are equal, round, and reactive to light. No scleral icterus.  Neck: Normal range of motion. Neck supple. Carotid bruit is not present. No thyromegaly present.  Cardiovascular: Normal rate, regular rhythm and normal heart sounds.  Exam reveals no gallop and no friction rub.   No murmur heard. Pulmonary/Chest: Effort normal and breath sounds normal. No respiratory distress. He has no wheezes. He has no rales.  Musculoskeletal: Normal range of motion. He exhibits edema (trace bilateral pedal edema.).  Lymphadenopathy:    He has no cervical adenopathy.  Neurological: He is alert and oriented to person, place, and time.  Vitals reviewed.      Recent Results (from the past 2160 hour(s))  Neuropathy Panel     Status: Abnormal   Collection Time: 05/04/16  2:04 PM  Result Value Ref Range   Vitamin B-12 311 232 - 1,245 pg/mL   Total Protein 6.9 6.0 - 8.5 g/dL   Albumin ELP 4.0 2.9 - 4.4 g/dL   Alpha 1 0.2 0.0 - 0.4 g/dL   Alpha 2 0.8 0.4 - 1.0 g/dL   Beta 1.1 0.7 - 1.3 g/dL   Gamma Globulin 0.7 0.4 - 1.8 g/dL   M-Spike, % Not Observed Not Observed g/dL   GLOBULIN, TOTAL 2.9 2.2 - 3.9 g/dL   A/G Ratio 1.4 0.7 - 1.7   Please Note: Comment     Comment: Protein electrophoresis scan will follow via computer, mail, or courier delivery.    TSH 2.550 0.450 - 4.500 uIU/mL   Vit D, 25-Hydroxy 19.3 (L) 30.0 - 100.0 ng/mL    Comment: Vitamin D deficiency has been defined by the  Institute of Medicine and an Endocrine Society practice guideline as a level of serum 25-OH vitamin D less than 20 ng/mL (1,2). The Endocrine Society went on to further define vitamin D insufficiency as a level between 21 and 29 ng/mL (2). 1. IOM (Institute of Medicine). 2010. Dietary reference    intakes for calcium and D. [place]: The    [employer]. 2. Holick MF,  Binkley Fordyce, Bischoff-Ferrari HA, et al.    Evaluation, treatment, and prevention of vitamin D    deficiency: an Endocrine Society clinical practice    guideline. JCEM. 2011 Jul; 96(7):1911-30.    Anit Nuclear Antibody(ANA) Negative Negative   Rhuematoid fact SerPl-aCnc <10.0 0.0 - 13.9 IU/mL   Angio Convert Enzyme 57 14 - 82 U/L   Sed Rate 4 0 - 30 mm/hr     Assessment & Plan  Problem List Items Addressed This Visit      Cardiovascular and Mediastinum   Hypertension - Primary     Endocrine   Diabetes (HCC)     Nervous and Auditory   Sciatica of right side associated with disorder of lumbosacral spine   Relevant Medications   gabapentin (NEURONTIN) 100 MG capsule   Peripheral polyneuropathy (HCC)   Relevant Medications   gabapentin (NEURONTIN) 100 MG capsule     Musculoskeletal and Integument   Degenerative disc disease, lumbar     Other   Hyperlipidemia   Pedal edema      Meds ordered this encounter  Medications  . gabapentin (NEURONTIN) 100 MG capsule    Sig: Take 1 capsule (100 mg total) by mouth 3 (three) times daily.    Dispense:  90 capsule    Refill:  12   1. Essential hypertension Cont meds  2. Diabetes mellitus due to underlying condition with hyperosmolarity without coma, without long-term current use of insulin (HCC) Cont to watch diet.  3. Sciatica of right side associated with disorder of lumbosacral spine   4. Peripheral polyneuropathy (HCC)  - gabapentin (NEURONTIN) 100 MG capsule; Take 1 capsule (100 mg total) by mouth 3 (three) times daily.  Dispense: 90  capsule; Refill: 12  5. Degenerative disc disease, lumbar   6. Hyperlipidemia, unspecified hyperlipidemia type Cont med  7. Pedal edema Cont Lasix as needed.

## 2016-05-11 ENCOUNTER — Telehealth: Payer: Self-pay | Admitting: *Deleted

## 2016-05-11 NOTE — Telephone Encounter (Signed)
Per Dr Marjory LiesPenumalli, spoke with patient and informed him his lab results are unremarkable and advised he continue with Vit D supplement as discussed earlier. Advised he FU with PCP for Vit D in future. He inquired about getting copy of results himself. Advised that since he is active in my chart, he will get those results in 3 days and can print out. He verbalized understanding, appreciation.

## 2016-05-11 NOTE — Therapy (Signed)
[facility] [address], [place], [address] Phone: [phone]   Fax:  [phone]  Physical Therapy Treatment  Patient Details  Name: Danny Patterson MRN: [id number] Date of Birth: [birth year] Referring Provider: Karn Cassis MD  Encounter Date: 05/10/2016      PT End of Session - 05/10/16 0931    Visit Number 55   Number of Visits 60   Date for PT Re-Evaluation 05/11/16   Authorization Type [id number]   Authorization Time Period 60 (G code)   PT Start Time 0920   PT Stop Time 1007   PT Time Calculation (min) 47 min   Equipment Utilized During Treatment Back brace   Activity Tolerance Patient tolerated treatment well;Patient limited by fatigue   Behavior During Therapy [facility] for tasks assessed/performed      Past Medical History:  Diagnosis Date  . Allergy   . Arthritis   . Diabetes mellitus without complication (HCC)   . Gout   . Hyperlipidemia   . Hypertension   . Kidney stone   . Rheumatic fever     Past Surgical History:  Procedure Laterality Date  . BACK SURGERY    . knot     removed from neck  . POSTERIOR LUMBAR FUSION 4 LEVEL N/A 10/01/2015   Procedure: Lumbar three-four,  Lumbar four-five,  Lumbar five-Sacrum one posterior lumbar interbody fusion with Laminotomy at Lumbar Two-Three;  Surgeon: Hilda Lias, MD;  Location: [facility];  Service: Neurosurgery;  Laterality: N/A;    There were no vitals filed for this visit.      Subjective Assessment - 05/10/16 0928    Subjective Patient reports noticing a difference in his leg strength and walking since beginning to wear back brace again starting Friday 05/07/16. He reports he continues with feeling decrease sensation in both feel with walking and feels unstable because of this.    Limitations Lifting;Standing;Walking;House hold activities;Other (comment)   Patient Stated Goals get back to full function for work and be able to walk  without AD   Currently in Pain? No/denies      Objective: Observation: ambulating into clinic wearing back brace with increased forward flexion at hips, increased cadence as compared to previous session Left quadriceps improving muscle bulk noted, decreased quad control, ability to quad set as compared to right LE  Treatment: Modalities: Electrical stimulation x 20 min.: russian stim. 10/10 cycle applied (2) electrodes: left quadriceps and high volt to medial/lateral aspect left knee and russian stim. To right anterior tibialis (10/10 cycle)with intensity to tolerance/contraction with patient seated in chair with LE's elevated andperforming active quad sets with each cycle: goal: neuromuscular re education/pain; no adverse reactions noted  Therapeutic exercise: patient performed with VC, tactile cues and demonstration of therapist as needed  Balance system for weight shifting forward and back and side to side: performed by patient 2 min. Each direction with VC  And assistance for correct foot placement and technique Performed limits of stability low level x 2 with platform unlocked to #10 to offer increased challenge to balance/stability (used bilateral UE support for balance/safety throughout exercise) Standing side step along balance beam x 2 min. Lateral step up onto balance beam with hip flexion to as close to 90 degrees as able, then tap beam and repeat 90 degrees flexion x 8-10reps leading with each LE; using both UE's for support when standing on right LE and performing hip flexion with left LE Sitting knee  extension with 4# ankle weights 2 x 15, knee flexion with double blue resistive band 2 x 15-20 reps Sitting hip abduction with manual resistance x 10 reps with 5 second holds at end range   Patient response to treatment: Patient able to perform strengthening exercises with improved technique with minimal VC for technique, required close supervision for safety due to weakness in  both LE's and decreased sensation in feet. Patient demonstrated improved quad control and able to perform improved active right ankle DF following estim.            PT Education - 05/10/16 0930    Education provided Yes   Education Details continue with HEP for strengthening and balance   Person(s) Educated Patient   Methods Explanation   Comprehension Verbalized understanding             PT Long Term Goals - 03/30/16 1009      PT LONG TERM GOAL #1   Title Patient will be able to walk safely, erect posture with appropriate AD on level surfaces >1200' and stairs by 05/11/2016   Baseline 02/05/2016 800' without AD  (limited to 4 min. due to fatigue, lower back pain) 03/30/16 deferred due to weakness   Status Revised     PT LONG TERM GOAL #2   Title Patient will demonstrate improved function for daily activities with mild pain as indicated by modified oswestry score of 30% or less by 05/11/2016   Baseline MODI 62% (severe self perceived disability); MODI 12/05/15 50%; 01/12/2016 = 48%; 02/16/2016 45%; 03/30/16 50% self perceive disabiltiy   Status Revised     PT LONG TERM GOAL #3   Title Patient will demonstrate functional community ambulation and low fall rist by of 10 seconds or less by 05/11/2016   Baseline 12/09/15 11.5 seconds; 01/12/2016 deferred; 02/05/16 12.25 seconds; 03/30/2016 13 seconds   Status Revised     PT LONG TERM GOAL #4   Title Patient will be independent with home program for exercises and self management by 05/11/2016 to continue with progressive healing once discharged from physical therapy    Baseline  limited knowledge of appropriate exercises and progression and requires assistance for performing exercises and progressing intensity appropriately   Status Revised               Plan - 05/10/16 1100    Clinical Impression Statement Patient continues to progress steadily towards goals with improved posture and abiltiy to ambulate with improved  hip/knee flexion and able to clear right foot with less difficulty.He also has improved from moderate to minimal cuing to perform most exercises. He is now wearing his back brace more consistently and this also seems to be helping. He continues with decreased strength in core and both LE's and decreased endurance which limits functional ambulation for home and community and he will therefore benefit from continued physical therpay intervention to achieve maximal functional return.    Rehab Potential Good   PT Frequency 2x / week   PT Duration 6 weeks   PT Treatment/Interventions Patient/family education;Gait training;Cryotherapy;Therapeutic activities;Therapeutic exercise;Neuromuscular re-education;Scar mobilization;Manual techniques;Moist Heat;Electrical Stimulation   PT Next Visit Plan progress exercises, core control, gait training, weight shifting; electrical stimulation left LE/RLE; re assess 6 min. Walk test, 5x sit to stand, , recertification   PT Home Exercise Plan walking, strengthening, ROM exercises as instructed      Patient will benefit from skilled therapeutic intervention in order to improve the following deficits and impairments:  Decreased  strength, Decreased activity tolerance, Decreased knowledge of precautions, Pain, Decreased endurance, Decreased range of motion, Difficulty walking, Increased muscle spasms  Visit Diagnosis: Muscle weakness (generalized)  Difficulty in walking, not elsewhere classified  Pain in both knees, unspecified chronicity     Problem List Patient Active Problem List   Diagnosis Date Noted  . Peripheral polyneuropathy (HCC) 05/10/2016  . Body mass index (bmi) 33.0-33.9, adult 04/28/2016  . Cellulitis 11/27/2015  . Pedal edema 11/27/2015  . Adjustment disorder with depressed mood   . Osteoarthritis of spine with radiculopathy, lumbar region 10/15/2015  . Abnormal MRI   . Slurred speech   . Surgery, elective   . Loose stools   . OSA  (obstructive sleep apnea)   . Acute blood loss anemia   . Pneumonia   . Urinary retention   . Lewy body dementia   . Encephalopathy   . Altered mental status   . Acute encephalopathy 10/12/2015  . Hypokalemia 10/12/2015  . Anemia 10/12/2015  . S/P lumbar fusion 10/12/2015  . Acute respiratory failure (HCC)   . Degenerative disc disease, lumbar 10/01/2015  . Sciatica of right side associated with disorder of lumbosacral spine 07/21/2015  . Hypertension 02/18/2015  . Diabetes (HCC) 02/18/2015  . Gout 02/18/2015  . Hyperlipidemia 02/18/2015  . Arthritis 02/18/2015  . Encounter to establish care 02/18/2015    Beacher MayBrooks, Marie PT 05/11/2016, 2:06 PM  [facility] [address] [place], [name], [id number] Phone: [phone]   Fax:  [phone]  Name: Danny Patterson MRN: [id number] Date of Birth: October 15, 1945

## 2016-05-12 ENCOUNTER — Encounter: Payer: Self-pay | Admitting: Physical Therapy

## 2016-05-12 ENCOUNTER — Ambulatory Visit: Payer: PPO | Admitting: Physical Therapy

## 2016-05-12 DIAGNOSIS — M6281 Muscle weakness (generalized): Secondary | ICD-10-CM | POA: Diagnosis not present

## 2016-05-12 DIAGNOSIS — M25562 Pain in left knee: Secondary | ICD-10-CM

## 2016-05-12 DIAGNOSIS — R262 Difficulty in walking, not elsewhere classified: Secondary | ICD-10-CM

## 2016-05-12 DIAGNOSIS — M25561 Pain in right knee: Secondary | ICD-10-CM

## 2016-05-13 NOTE — Therapy (Signed)
[facility] [facility] [address] [place], [address] Phone: [phone]   Fax:  [phone]  Physical Therapy Treatment  Patient Details  Name: Danny Patterson MRN: [id number] Date of Birth: 04-Nov-1945 Referring Provider: Floyce Stakes MD  Encounter Date: 05/12/2016      PT End of Session - 05/12/16 1256    Visit Number 56   Number of Visits 72   Date for PT Re-Evaluation 06/23/16   Authorization Type 20   Authorization Time Period 60 (G code)   PT Start Time 1150   PT Stop Time 1253   PT Time Calculation (min) 63 min   Activity Tolerance Patient tolerated treatment well;Patient limited by fatigue   Behavior During Therapy [facility] for tasks assessed/performed      Past Medical History:  Diagnosis Date  . Allergy   . Arthritis   . Diabetes mellitus without complication ([place])   . Gout   . Hyperlipidemia   . Hypertension   . Kidney stone   . Rheumatic fever     Past Surgical History:  Procedure Laterality Date  . BACK SURGERY    . knot     removed from neck  . POSTERIOR LUMBAR FUSION 4 LEVEL N/A 10/01/2015   Procedure: Lumbar three-four,  Lumbar four-five,  Lumbar five-Sacrum one posterior lumbar interbody fusion with Laminotomy at Lumbar Two-Three;  Surgeon: Leeroy Cha, MD;  Location: [place] [facility];  Service: Neurosurgery;  Laterality: N/A;    There were no vitals filed for this visit.      Subjective Assessment - 05/12/16 1210    Subjective Patient reports he feels he is benefitting from physical therapy and noticing improved abiltiy to walk with less difficulty and improving balance. He is feeling stronger in legs with electrical stimulation as well.    Limitations Lifting;Standing;Walking;House hold activities;Other (comment)   Patient Stated Goals get back to full function for work and be able to walk without AD   Currently in Pain? No/denies      Objective: Observation: ambulating into  clinic wearing back brace with increased forward flexion at hips, increased cadence as compared to previous session Left quadriceps improving muscle bulk noted, decreased quad control, ability to quad set as compared to right LE; improving Outcome measures: MODI 45% (0 = no self perceived disability) LEFS: 20/80 (80 = no self perceived disability) 6 min. Walk test 1000' 5x sit to stand; unable to complete without UE support  Treatment: Modalities: Electrical stimulation x 20 min.: russian stim. 10/10 cycle applied (2) electrodes: left quadriceps and high volt to medial/lateral aspect left knee and russian stim. To right anterior tibialis (10/10 cycle)with intensity to tolerance/contraction with patient seated in chair with LE's elevated andperforming active quad sets with each cycle: goal: neuromuscular re education/pain; no adverse reactions noted  Therapeutic exercise: patient performed with VC, tactile cues and demonstration of therapist as needed  Balance system for weight shifting forward and back and side to side: performed by patient 1 min. Each direction with VC And assistance for correct foot placement and technique Performed limits of stability low and medium skill levels x 2 each  with platform unlocked to #10/#9 to offer increased challenge to balance/stability (used bilateral UE support for balance/safety throughout exercise) accuracy ranged from 65% to 80% with repetition indicating improved motor control  Lateral step up onto balance beam with balance stone on top (for increased challenge with balance/motor control): with hip flexion to as close  to 90 degrees as able, then tap beam and repeat 90 degrees flexion x 8-10reps leading with each LE; using both UE's for support when standing on right LE and performing hip flexion with left LE  Side stepping along balance beam with balance stones placed on top 5 reps with UE support and close supervision of therapist for safety Walk  across balance stones x 5 sets forward only with staggered placement to work on balance  Sitting knee extension with 5# ankle weights 2 x 15, knee flexion with double blue resistive band 2 x 20 reps Sitting hip abduction with doubled blue resistive band  x 10 reps with 5 second holds at end range   Patient response to treatment: Patient improved motor control with all balance, walking exercises with increased challenge using decreased base of support and more unstable surfaces. He required close supervision and UE support for safety due to weakness in both LE's and decreased sensation in feet. Patient with improved quad control left LE and right foot/ankle control following estim.treatment         PT Education - 05/12/16 1255    Education provided Yes   Education Details instructed in advanced balance exercises and balance system for increased motor control   Person(s) Educated Patient   Methods Explanation;Demonstration;Verbal cues   Comprehension Verbalized understanding;Returned demonstration;Verbal cues required;Need further instruction             PT Long Term Goals - 05/12/16 1300      PT LONG TERM GOAL #1   Title Patient will be able to walk independently and safely with erect posture  on level surfaces >1200' and stairs by 06/23/2016   Baseline 02/05/2016 800' without AD  (limited to 4 min. due to fatigue, lower back pain) 05/12/16 1000' without AD; increased back fatigue; goal partially met: revise goal   Status Revised     PT LONG TERM GOAL #2   Title Patient will demonstrate improved function for daily activities with mild pain as indicated by modified oswestry score of 30% or less by 06/23/2016   Baseline MODI 62% (severe self perceived disability); MODI 12/05/15 50%; 01/12/2016 = 48%; 02/16/2016 45%; 03/30/16 50% self perceive disabiltiy; 45% 05/12/16   Status Revised     PT LONG TERM GOAL #3   Title Patient will demonstrate functional community ambulation and low fall  rist by 10MW of 10 seconds or less by 06/23/2016   Baseline 12/09/15 11.5 seconds; 01/12/2016 deferred; 02/05/16 12.25 seconds; 03/30/2016 13 seconds; 05/12/16 12 seconds   Status Revised     PT LONG TERM GOAL #4   Title Patient will be independent with home program for exercises and self management by 06/23/2016 to continue with progressive healing once discharged from physical therapy    Baseline  limited knowledge of appropriate exercises and progression and requires assistance for performing exercises and progressing intensity appropriately   Status Revised               Plan - 05/12/16 1257    Clinical Impression Statement Patient is progressing steadily towards goals for improving strength in core and LE's, endurance for functional community and household ambulation and activities. He has decreased strength in core,bilateral LE's and decreased endurance as demonstrated  by scores for LEFS and 6 min. walk test and 5x sit to stand. He will benefit from continued physical therapy intervention to achieve maximal functional return and be able to transition to home program for continued improvement with self managment.  Rehab Potential Good   Clinical Impairments Affecting Rehab Potential (+) acute condition (surgery), family support, motivated (-) chronic condition of back pain/weakness prior to sx, age, multi level fusion lumbar spine, neuropathy   PT Frequency 2x / week   PT Duration 6 weeks   PT Treatment/Interventions Patient/family education;Gait training;Cryotherapy;Therapeutic activities;Therapeutic exercise;Neuromuscular re-education;Scar mobilization;Manual techniques;Moist Heat;Electrical Stimulation   PT Next Visit Plan progress exercises, core control, gait training, weight shifting; electrical stimulation left LE/RLE   PT Home Exercise Plan walking, strengthening, ROM exercises as instructed   Consulted and Agree with Plan of Care Patient      Patient will benefit from  skilled therapeutic intervention in order to improve the following deficits and impairments:  Decreased strength, Decreased activity tolerance, Decreased knowledge of precautions, Pain, Decreased endurance, Decreased range of motion, Difficulty walking, Increased muscle spasms  Visit Diagnosis: Muscle weakness (generalized) - Plan: PT plan of care cert/re-cert  Difficulty in walking, not elsewhere classified - Plan: PT plan of care cert/re-cert  Pain in both knees, unspecified chronicity - Plan: PT plan of care cert/re-cert     Problem List Patient Active Problem List   Diagnosis Date Noted  . Peripheral polyneuropathy ([place]) 05/10/2016  . Body mass index (bmi) 33.0-33.9, adult 04/28/2016  . Cellulitis 11/27/2015  . Pedal edema 11/27/2015  . Adjustment disorder with depressed mood   . Osteoarthritis of spine with radiculopathy, lumbar region 10/15/2015  . Abnormal MRI   . Slurred speech   . Surgery, elective   . Loose stools   . OSA (obstructive sleep apnea)   . Acute blood loss anemia   . Pneumonia   . Urinary retention   . Lewy body dementia   . Encephalopathy   . Altered mental status   . Acute encephalopathy 10/12/2015  . Hypokalemia 10/12/2015  . Anemia 10/12/2015  . S/P lumbar fusion 10/12/2015  . Acute respiratory failure ([place])   . Degenerative disc disease, lumbar 10/01/2015  . Sciatica of right side associated with disorder of lumbosacral spine 07/21/2015  . Hypertension 02/18/2015  . Diabetes ([place]) 02/18/2015  . Gout 02/18/2015  . Hyperlipidemia 02/18/2015  . Arthritis 02/18/2015  . Encounter to establish care 02/18/2015    Jomarie Longs PT 05/13/2016, 7:21 PM  [facility] [address], [place], [address] Phone: [phone]   Fax:  [phone]  Name: Danny Patterson MRN: [id number] Date of Birth: 07-02-1945

## 2016-05-17 ENCOUNTER — Ambulatory Visit: Payer: PPO | Admitting: Physical Therapy

## 2016-05-17 ENCOUNTER — Encounter: Payer: Self-pay | Admitting: Physical Therapy

## 2016-05-17 DIAGNOSIS — R262 Difficulty in walking, not elsewhere classified: Secondary | ICD-10-CM

## 2016-05-17 DIAGNOSIS — M6281 Muscle weakness (generalized): Secondary | ICD-10-CM

## 2016-05-17 DIAGNOSIS — M25561 Pain in right knee: Secondary | ICD-10-CM

## 2016-05-17 DIAGNOSIS — M25562 Pain in left knee: Secondary | ICD-10-CM

## 2016-05-17 NOTE — Therapy (Signed)
[facility] [address] [place], [address] Phone: [phone]   Fax:  [phone]  Physical Therapy Treatment  Patient Details  Name: Danny Patterson MRN: [id number] Date of Birth: 1945-10-28 Referring Provider: Floyce Stakes MD  Encounter Date: 05/17/2016      PT End of Session - 05/17/16 1224    Visit Number 92   Number of Visits 72   Date for PT Re-Evaluation 06/23/16   Authorization Type [id number]   Authorization Time Period 60 (G code)   PT Start Time 1138   PT Stop Time 1233   PT Time Calculation (min) 55 min   Activity Tolerance Patient tolerated treatment well;Patient limited by fatigue   Behavior During Therapy [facility] for tasks assessed/performed      Past Medical History:  Diagnosis Date  . Allergy   . Arthritis   . Diabetes mellitus without complication ([place])   . Gout   . Hyperlipidemia   . Hypertension   . Kidney stone   . Rheumatic fever     Past Surgical History:  Procedure Laterality Date  . BACK SURGERY    . knot     removed from neck  . POSTERIOR LUMBAR FUSION 4 LEVEL N/A 10/01/2015   Procedure: Lumbar three-four,  Lumbar four-five,  Lumbar five-Sacrum one posterior lumbar interbody fusion with Laminotomy at Lumbar Two-Three;  Surgeon: Leeroy Cha, MD;  Location: [place] [facility];  Service: Neurosurgery;  Laterality: N/A;    There were no vitals filed for this visit.      Subjective Assessment - 05/17/16 1222    Subjective Patient reports he is not wearing back brace currently and is doing well. He did some yard work over the weekend and feels he did better with walking around yard. He still has decreased sensation in feet with walking and is concerned about this. He feels therapy is helping with this as well.    Limitations Lifting;Standing;Walking;House hold activities;Other (comment)   Patient Stated Goals get back to full function for work and be able to walk without AD   Currently in Pain? No/denies         Objective:   Observation: ambulating into clinic, no back brace in use with increased forward flexion at hips, increased cadence as compared to previous session  Treatment: Modalities: Electrical stimulation x 20 min.: russian stim. 10/10 cycle applied (2) electrodes: left quadriceps and high volt to medial/lateral aspect left knee and russian stim. To right anterior tibialis (10/10 cycle)with intensity to tolerance/contraction with patient seated in chair with LE's elevated andperforming active quad sets with each cycle: goal: neuromuscular re education/pain; no adverse reactions noted  Therapeutic exercise: patient performed with VC, tactile cues and demonstration of therapist as needed  Balance system with visual cues: for weight shifting forward and back and side to side: performed by patient 1 min. Each direction with VC And assistance for correct foot placement and technique  Performed limits of stability low and medium and high skill levels x 2 each  with platform unlocked to #9 to offer increased challenge to balance/stability (used bilateral UE support for balance/safety throughout exercise) accuracy ranged from 70% to 85% with repetition indicating improved motor control  Lateral step up onto balance beam with balance stone on top (for increased challenge with balance/motor control): with hip flexion to as close to 90 degrees as able, then tap beam and repeat 90 degrees flexion x 8-10reps leading with each LE; using  both UE's for support when standing on right LE and performing hip flexion with left LE  Side stepping along balance beam with balance stones placed on top 5 reps with UE support and close supervision of therapist for safety Walk across balance stones x 5 sets forward only with staggered placement to work on balance  Patient response to treatment: Patient demonstrated improved motor control with visual cues and VC on balance  system and improved gait pattern with improved posture following exercises. He required close supervision and UE support for safety due to weakness in both LE's and decreased sensation in both feet. Improved muscle control following [place] stimulation to left quadriceps, right lower leg/ankle.          PT Education - 05/17/16 1223    Education provided Yes   Education Details HEP: conitniues with standing and walking exercises for balance   Person(s) Educated Patient   Methods Explanation;Demonstration;Verbal cues   Comprehension Verbalized understanding;Returned demonstration;Verbal cues required             PT Long Term Goals - 05/12/16 1300      PT LONG TERM GOAL #1   Title Patient will be able to walk independently and safely with erect posture  on level surfaces >1200' and stairs by 06/23/2016   Baseline 02/05/2016 800' without AD  (limited to 4 min. due to fatigue, lower back pain) 05/12/16 1000' without AD; increased back fatigue; goal partially met: revise goal   Status Revised     PT LONG TERM GOAL #2   Title Patient will demonstrate improved function for daily activities with mild pain as indicated by modified oswestry score of 30% or less by 06/23/2016   Baseline MODI 62% (severe self perceived disability); MODI 12/05/15 50%; 01/12/2016 = 48%; 02/16/2016 45%; 03/30/16 50% self perceive disabiltiy; 45% 05/12/16   Status Revised     PT LONG TERM GOAL #3   Title Patient will demonstrate functional community ambulation and low fall rist by 10MW of 10 seconds or less by 06/23/2016   Baseline 12/09/15 11.5 seconds; 01/12/2016 deferred; 02/05/16 12.25 seconds; 03/30/2016 13 seconds; 05/12/16 12 seconds   Status Revised     PT LONG TERM GOAL #4   Title Patient will be independent with home program for exercises and self management by 06/23/2016 to continue with progressive healing once discharged from physical therapy    Baseline  limited knowledge of appropriate exercises and progression  and requires assistance for performing exercises and progressing intensity appropriately   Status Revised               Plan - 05/17/16 1232    Clinical Impression Statement Patient continue to demonstrate improving endurance, motor control and strength in both LE's. He is responding favorably to electrical stimulation for muscle re education and exercises for improving balance and strengthening LE's and should continue to improve with additional physical therapy intervention.    Rehab Potential Good   PT Frequency 2x / week   PT Duration 6 weeks   PT Treatment/Interventions Patient/family education;Gait training;Cryotherapy;Therapeutic activities;Therapeutic exercise;Neuromuscular re-education;Scar mobilization;Manual techniques;Moist Heat;Electrical Stimulation   PT Next Visit Plan progress exercises, core control, gait training, weight shifting; electrical stimulation left LE/RLE   PT Home Exercise Plan walking, strengthening, ROM exercises as instructed      Patient will benefit from skilled therapeutic intervention in order to improve the following deficits and impairments:  Decreased strength, Decreased activity tolerance, Decreased knowledge of precautions, Pain, Decreased endurance, Decreased range of motion,  Difficulty walking, Increased muscle spasms  Visit Diagnosis: Muscle weakness (generalized)  Difficulty in walking, not elsewhere classified  Pain in both knees, unspecified chronicity     Problem List Patient Active Problem List   Diagnosis Date Noted  . Peripheral polyneuropathy ([place]) 05/10/2016  . Body mass index (bmi) 33.0-33.9, adult 04/28/2016  . Cellulitis 11/27/2015  . Pedal edema 11/27/2015  . Adjustment disorder with depressed mood   . Osteoarthritis of spine with radiculopathy, lumbar region 10/15/2015  . Abnormal MRI   . Slurred speech   . Surgery, elective   . Loose stools   . OSA (obstructive sleep apnea)   . Acute blood loss anemia   .  Pneumonia   . Urinary retention   . Lewy body dementia   . Encephalopathy   . Altered mental status   . Acute encephalopathy 10/12/2015  . Hypokalemia 10/12/2015  . Anemia 10/12/2015  . S/P lumbar fusion 10/12/2015  . Acute respiratory failure ([place])   . Degenerative disc disease, lumbar 10/01/2015  . Sciatica of right side associated with disorder of lumbosacral spine 07/21/2015  . Hypertension 02/18/2015  . Diabetes ([place]) 02/18/2015  . Gout 02/18/2015  . Hyperlipidemia 02/18/2015  . Arthritis 02/18/2015  . Encounter to establish care 02/18/2015    Jomarie Longs PT 05/18/2016, 5:25 PM  [facility] [address] [place], [address] Phone: [phone]   Fax:  [phone]  Name: Danny Patterson MRN: [id number] Date of Birth: [birth year]

## 2016-05-20 ENCOUNTER — Ambulatory Visit: Payer: PPO | Attending: Neurosurgery | Admitting: Physical Therapy

## 2016-05-20 ENCOUNTER — Encounter: Payer: Self-pay | Admitting: Physical Therapy

## 2016-05-20 DIAGNOSIS — M25561 Pain in right knee: Secondary | ICD-10-CM | POA: Diagnosis not present

## 2016-05-20 DIAGNOSIS — M25562 Pain in left knee: Secondary | ICD-10-CM | POA: Diagnosis not present

## 2016-05-20 DIAGNOSIS — M6281 Muscle weakness (generalized): Secondary | ICD-10-CM

## 2016-05-20 DIAGNOSIS — R262 Difficulty in walking, not elsewhere classified: Secondary | ICD-10-CM

## 2016-05-20 NOTE — Therapy (Signed)
[facility] [address] [place], [address] Phone: [phone]   Fax:  [phone]  Physical Therapy Treatment  Patient Details  Name: Danny Patterson MRN: [id number] Date of Birth: 1945-12-02 Referring Provider: Floyce Stakes MD  Encounter Date: 05/20/2016      PT End of Session - 05/20/16 0957    Visit Number 68   Number of Visits 72   Date for PT Re-Evaluation 06/23/16   Authorization Type [id number]   Authorization Time Period 57 (G code)   PT Start Time 0947   PT Stop Time 1033   PT Time Calculation (min) 46 min   Activity Tolerance Patient tolerated treatment well;Patient limited by fatigue   Behavior During Therapy [facility] for tasks assessed/performed      Past Medical History:  Diagnosis Date  . Allergy   . Arthritis   . Diabetes mellitus without complication ([place])   . Gout   . Hyperlipidemia   . Hypertension   . Kidney stone   . Rheumatic fever     Past Surgical History:  Procedure Laterality Date  . BACK SURGERY    . knot     removed from neck  . POSTERIOR LUMBAR FUSION 4 LEVEL N/A 10/01/2015   Procedure: Lumbar three-four,  Lumbar four-five,  Lumbar five-Sacrum one posterior lumbar interbody fusion with Laminotomy at Lumbar Two-Three;  Surgeon: Leeroy Cha, MD;  Location: [facility] [facility];  Service: Neurosurgery;  Laterality: N/A;    There were no vitals filed for this visit.      Subjective Assessment - 05/20/16 0954    Subjective Patient reports he is not wearing back brace currently and is doing well. He reports he had increased sensation in right foot and could move right ankle up and down last evening and is encouraged with new signs of movement.    Limitations Lifting;Standing;Walking;House hold activities;Other (comment)   Patient Stated Goals get back to full function for work and be able to walk without AD   Currently in Pain? No/denies          Objective:   Observation:   Left LE atrophy of quadriceps muscle as compared to right, right LE decreased active DF of ankle  Treatment: Modalities: Electrical stimulation x 20 min.: russian stim. 10/10 cycle applied (2) electrodes: left quadriceps and high volt to medial/lateral aspect left knee and russian stim.  right anterior tibialis (10/10 cycle)with intensity to tolerance/contraction with patient seated in chair with LE's elevated andperforming active quad sets with each cycle: goal: neuromuscular re education/pain; no adverse reactions noted  Therapeutic exercise:patient performed with VC, tactile cues and demonstration of therapist as needed  Balance system with visual cues: for weight shifting, using both UE's for support Performed limits of stability low and medium and high skill levels x 2 each with platform unlocked to #7to offer increased challenge to balance/stability (used bilateral UE support for balance/safety throughout exercise) accuracy ranged from 85% to 95% with repetition indicating improved motor control  Lateral step up onto balance beam with balance stone on top (for increased challenge with balance/motor control):with hip flexion to as close to 90 degrees as able, then tap beam and repeat 90 degrees flexion x 8-10reps leading with each LE; using both UE's for support when standing on either LE and performing hip flexion   Side stepping along balance beam with balance stones placed on top x 1 min. with UE support and close supervision of therapist for  safety Seated strengthening exercises:  knee extension 2 x 15 with 5# ankle weights, knee flexion with double blue resistive band 2 x 20 Hip abduction with manual resistance x 15 reps with hold x count of 5 each rep Scapular rows with double blue resistive band x 20 reps with assist of therapist to grade resistance and VC for correct technique/positioning of UE's     Patient response to treatment: Patient improved accuracy 20% from 70 to  90% from previous session with balance system exercises demonstrating improved motor control.  He required close supervision and UE support for safety       with all standing and walking exercises. Improved motor control left LE and right ankle following estim.           PT Education - 05/20/16 0956    Education provided Yes   Education Details HEP: instructed in frequency and intensity to allow recovery from heavy workout days.    Person(s) Educated Patient   Methods Explanation   Comprehension Verbalized understanding             PT Long Term Goals - 05/12/16 1300      PT LONG TERM GOAL #1   Title Patient will be able to walk independently and safely with erect posture  on level surfaces >1200' and stairs by 06/23/2016   Baseline 02/05/2016 800' without AD  (limited to 4 min. due to fatigue, lower back pain) 05/12/16 1000' without AD; increased back fatigue; goal partially met: revise goal   Status Revised     PT LONG TERM GOAL #2   Title Patient will demonstrate improved function for daily activities with mild pain as indicated by modified oswestry score of 30% or less by 06/23/2016   Baseline MODI 62% (severe self perceived disability); MODI 12/05/15 50%; 01/12/2016 = 48%; 02/16/2016 45%; 03/30/16 50% self perceive disabiltiy; 45% 05/12/16   Status Revised     PT LONG TERM GOAL #3   Title Patient will demonstrate functional community ambulation and low fall rist by of 10 seconds or less by 06/23/2016   Baseline 12/09/15 11.5 seconds; 01/12/2016 deferred; 02/05/16 12.25 seconds; 03/30/2016 13 seconds; 05/12/16 12 seconds   Status Revised     PT LONG TERM GOAL #4   Title Patient will be independent with home program for exercises and self management by 06/23/2016 to continue with progressive healing once discharged from physical therapy    Baseline  limited knowledge of appropriate exercises and progression and requires assistance for performing exercises and progressing intensity  appropriately   Status Revised               Plan - 05/20/16 1058    Clinical Impression Statement Patient demonstrates improvement with strength as demonstrated with ability to actively DF right ankle with greater control and able to perform standing, balance and walking exercises with less difficulty. He continues to require close supervision for safety due to decreased sensation in feet and weakness in core, LE's and will therefore benefit from continued physical therapy intervention to achieve goals.    Rehab Potential Good   PT Frequency 2x / week   PT Duration 6 weeks   PT Treatment/Interventions Patient/family education;Gait training;Cryotherapy;Therapeutic activities;Therapeutic exercise;Neuromuscular re-education;Scar mobilization;Manual techniques;Moist Heat;Electrical Stimulation   PT Next Visit Plan progress exercises, core control, gait training, weight shifting; electrical stimulation left LE/RLE **advance to one hand support on balance system and increase to 7 1/2 # ankle weights for knee extension   PT Home  Exercise Plan walking, strengthening, ROM exercises as instructed      Patient will benefit from skilled therapeutic intervention in order to improve the following deficits and impairments:  Decreased strength, Decreased activity tolerance, Decreased knowledge of precautions, Pain, Decreased endurance, Decreased range of motion, Difficulty walking, Increased muscle spasms  Visit Diagnosis: Muscle weakness (generalized)  Difficulty in walking, not elsewhere classified  Pain in both knees, unspecified chronicity     Problem List Patient Active Problem List   Diagnosis Date Noted  . Peripheral polyneuropathy ([place]) 05/10/2016  . Body mass index (bmi) 33.0-33.9, adult 04/28/2016  . Cellulitis 11/27/2015  . Pedal edema 11/27/2015  . Adjustment disorder with depressed mood   . Osteoarthritis of spine with radiculopathy, lumbar region 10/15/2015  . Abnormal MRI    . Slurred speech   . Surgery, elective   . Loose stools   . OSA (obstructive sleep apnea)   . Acute blood loss anemia   . Pneumonia   . Urinary retention   . Lewy body dementia   . Encephalopathy   . Altered mental status   . Acute encephalopathy 10/12/2015  . Hypokalemia 10/12/2015  . Anemia 10/12/2015  . S/P lumbar fusion 10/12/2015  . Acute respiratory failure ([place])   . Degenerative disc disease, lumbar 10/01/2015  . Sciatica of right side associated with disorder of lumbosacral spine 07/21/2015  . Hypertension 02/18/2015  . Diabetes ([place]) 02/18/2015  . Gout 02/18/2015  . Hyperlipidemia 02/18/2015  . Arthritis 02/18/2015  . Encounter to establish care 02/18/2015    Jomarie Longs PT 05/20/2016, 11:01 AM  [facility] [address], [place], [address] Phone: [phone]   Fax:  [phone]  Name: Danny Patterson MRN: [id number] Date of Birth: 1945/08/12

## 2016-05-26 ENCOUNTER — Ambulatory Visit: Payer: PPO | Admitting: Physical Therapy

## 2016-05-26 ENCOUNTER — Encounter: Payer: Self-pay | Admitting: Physical Therapy

## 2016-05-26 DIAGNOSIS — M6281 Muscle weakness (generalized): Secondary | ICD-10-CM

## 2016-05-26 DIAGNOSIS — R262 Difficulty in walking, not elsewhere classified: Secondary | ICD-10-CM

## 2016-05-26 NOTE — Therapy (Signed)
[facility] [address] [place], [address] Phone: [phone]   Fax:  [phone]  Physical Therapy Treatment  Patient Details  Name: Danny Patterson MRN: [id number] Date of Birth: [birth year] Referring Provider: Floyce Stakes MD  Encounter Date: 05/26/2016      PT End of Session - 05/26/16 1200    Visit Number [id number]   Number of Visits 72   Date for PT Re-Evaluation 06/23/16   Authorization Type 17   Authorization Time Period 60 (G code)   PT Start Time 0930   PT Stop Time 1015   PT Time Calculation (min) 45 min   Activity Tolerance Patient tolerated treatment well;Patient limited by fatigue   Behavior During Therapy [facility] for tasks assessed/performed      Past Medical History:  Diagnosis Date  . Allergy   . Arthritis   . Diabetes mellitus without complication ([place])   . Gout   . Hyperlipidemia   . Hypertension   . Kidney stone   . Rheumatic fever     Past Surgical History:  Procedure Laterality Date  . BACK SURGERY    . knot     removed from neck  . POSTERIOR LUMBAR FUSION 4 LEVEL N/A 10/01/2015   Procedure: Lumbar three-four,  Lumbar four-five,  Lumbar five-Sacrum one posterior lumbar interbody fusion with Laminotomy at Lumbar Two-Three;  Surgeon: Leeroy Cha, MD;  Location: [place];  Service: Neurosurgery;  Laterality: N/A;    There were no vitals filed for this visit.      Subjective Assessment - 05/26/16 0948    Subjective Patient reports he is not wearing back bracel. He is moving with less difficulty and is taking more gabapentin intermittently and feels this is having a positive effect on his feeling in feet.    Limitations Lifting;Standing;Walking;House hold activities;Other (comment)   Patient Stated Goals get back to full function for work and be able to walk without AD   Currently in Pain? No/denies        Treatment: Modalities: Electrical stimulation x 20 min.:  russian stim. 10/10 cycle applied (2) electrodes: left quadriceps and high volt to medial/lateral aspect left knee and russian stim.  right anterior tibialis (10/10 cycle)with intensity to tolerance/contraction with patient seated in chair with LE's elevated andperforming active quad sets with each cycle: goal: neuromuscular re education/pain; no adverse reactions noted  Therapeutic exercise:patient performed with VC, tactile cues and demonstration of therapist as needed  Balance system with visual cues: for weight shifting, using both UE's for support Performed limits of stability low and medium and highskill levels x 2 each with platform unlocked to #7to offer increased challenge to balance/stability (used bilateral UE support for balance/safety throughout exercise) accuracy ranged from 90% to 95% with repetition indicating improved motor control  Lateral step up onto balance beam with balance stone on top (for increased challenge with balance/motor control):with hip flexion to as close to 90 degrees as able, then tap beam and repeat 90 degrees flexion x 8-10reps leading with each LE; using both UE's for support when standing on either LE and performing hip flexion   Side stepping along balance beam with balance stones placed on top x 1 min. with UE support and close supervision of therapist for safety Seated strengthening exercises:  knee extension 2 x 15 with 5# ankle weights, knee flexion with double blue resistive band 2 x 20 Hip abduction with manual resistance x 15 reps with  hold x count of 5 each rep Scapular rows with double blue resistive band x 20 reps with assist of therapist to grade resistance and VC for correct technique/positioning of UE's     Patient response to treatment: Patient demonstrated improved balance and motor control with exercises with VC. He required close supervision and UE support for safety with all standing and walking exercises.  Improved motor control  left LE and right ankle following estim.          PT Education - 05/26/16 1200    Education provided Yes   Education Details HEP re assessed verbally   Person(s) Educated Patient   Methods Explanation   Comprehension Verbalized understanding             PT Long Term Goals - 05/12/16 1300      PT LONG TERM GOAL #1   Title Patient will be able to walk independently and safely with erect posture  on level surfaces >1200' and stairs by 06/23/2016   Baseline 02/05/2016 800' without AD  (limited to 4 min. due to fatigue, lower back pain) 05/12/16 1000' without AD; increased back fatigue; goal partially met: revise goal   Status Revised     PT LONG TERM GOAL #2   Title Patient will demonstrate improved function for daily activities with mild pain as indicated by modified oswestry score of 30% or less by 06/23/2016   Baseline MODI 62% (severe self perceived disability); MODI 12/05/15 50%; 01/12/2016 = 48%; 02/16/2016 45%; 03/30/16 50% self perceive disabiltiy; 45% 05/12/16   Status Revised     PT LONG TERM GOAL #3   Title Patient will demonstrate functional community ambulation and low fall rist by 10MW of 10 seconds or less by 06/23/2016   Baseline 12/09/15 11.5 seconds; 01/12/2016 deferred; 02/05/16 12.25 seconds; 03/30/2016 13 seconds; 05/12/16 12 seconds   Status Revised     PT LONG TERM GOAL #4   Title Patient will be independent with home program for exercises and self management by 06/23/2016 to continue with progressive healing once discharged from physical therapy    Baseline  limited knowledge of appropriate exercises and progression and requires assistance for performing exercises and progressing intensity appropriately   Status Revised               Plan - 05/26/16 1201    Clinical Impression Statement Patient demosntrates improvement with increased endurance during therapy sessions with decreased rest periods needed between exercises. He continues with decreased strength and  requires guidance and supervision for safety during treatment session.    Rehab Potential Good   PT Frequency 2x / week   PT Duration 6 weeks   PT Treatment/Interventions Patient/family education;Gait training;Cryotherapy;Therapeutic activities;Therapeutic exercise;Neuromuscular re-education;Scar mobilization;Manual techniques;Moist Heat;Electrical Stimulation   PT Next Visit Plan progress exercises, core control, gait training, weight shifting; electrical stimulation left LE/RLE   PT Home Exercise Plan walking, strengthening, ROM exercises as instructed      Patient will benefit from skilled therapeutic intervention in order to improve the following deficits and impairments:  Decreased strength, Decreased activity tolerance, Decreased knowledge of precautions, Pain, Decreased endurance, Decreased range of motion, Difficulty walking, Increased muscle spasms  Visit Diagnosis: Muscle weakness (generalized)  Difficulty in walking, not elsewhere classified     Problem List Patient Active Problem List   Diagnosis Date Noted  . Peripheral polyneuropathy ([place]) 05/10/2016  . Body mass index (bmi) 33.0-33.9, adult 04/28/2016  . Cellulitis 11/27/2015  . Pedal edema 11/27/2015  . Adjustment  disorder with depressed mood   . Osteoarthritis of spine with radiculopathy, lumbar region 10/15/2015  . Abnormal MRI   . Slurred speech   . Surgery, elective   . Loose stools   . OSA (obstructive sleep apnea)   . Acute blood loss anemia   . Pneumonia   . Urinary retention   . Lewy body dementia   . Encephalopathy   . Altered mental status   . Acute encephalopathy 10/12/2015  . Hypokalemia 10/12/2015  . Anemia 10/12/2015  . S/P lumbar fusion 10/12/2015  . Acute respiratory failure ([place])   . Degenerative disc disease, lumbar 10/01/2015  . Sciatica of right side associated with disorder of lumbosacral spine 07/21/2015  . Hypertension 02/18/2015  . Diabetes ([place]) 02/18/2015  . Gout 02/18/2015   . Hyperlipidemia 02/18/2015  . Arthritis 02/18/2015  . Encounter to establish care 02/18/2015    Jomarie Longs PT 05/27/2016, 5:03 PM  [facility] [address], [place], [address] Phone: [phone]   Fax:  [phone]  Name: Danny Patterson MRN: [id number] Date of Birth: 06-26-1945

## 2016-05-28 ENCOUNTER — Encounter: Payer: Self-pay | Admitting: Physical Therapy

## 2016-05-28 ENCOUNTER — Ambulatory Visit: Payer: PPO | Admitting: Physical Therapy

## 2016-05-28 DIAGNOSIS — R262 Difficulty in walking, not elsewhere classified: Secondary | ICD-10-CM

## 2016-05-28 DIAGNOSIS — M25561 Pain in right knee: Secondary | ICD-10-CM

## 2016-05-28 DIAGNOSIS — M6281 Muscle weakness (generalized): Secondary | ICD-10-CM | POA: Diagnosis not present

## 2016-05-28 DIAGNOSIS — M25562 Pain in left knee: Secondary | ICD-10-CM

## 2016-05-28 NOTE — Therapy (Signed)
[facility] [address] [place], [address] Phone: [phone]   Fax:  [phone]  Physical Therapy Treatment/Progress Report  Patient Details  Name: Danny Patterson MRN: [id number] Date of Birth: 06/28/1945 Referring Provider: Floyce Stakes MD  Encounter Date: 05/28/2016      PT End of Session - 05/28/16 1010    Visit Number 60   Number of Visits 72   Date for PT Re-Evaluation 06/23/16   Authorization Type 60   Authorization Time Period 44 (G code)   PT Start Time [phone]   PT Stop Time 1050   PT Time Calculation (min) 58 min   Activity Tolerance Patient tolerated treatment well;Patient limited by fatigue   Behavior During Therapy [facility] for tasks assessed/performed      Past Medical History:  Diagnosis Date  . Allergy   . Arthritis   . Diabetes mellitus without complication ([place])   . Gout   . Hyperlipidemia   . Hypertension   . Kidney stone   . Rheumatic fever     Past Surgical History:  Procedure Laterality Date  . BACK SURGERY    . knot     removed from neck  . POSTERIOR LUMBAR FUSION 4 LEVEL N/A 10/01/2015   Procedure: Lumbar three-four,  Lumbar four-five,  Lumbar five-Sacrum one posterior lumbar interbody fusion with Laminotomy at Lumbar Two-Three;  Surgeon: Leeroy Cha, MD;  Location: [facility];  Service: Neurosurgery;  Laterality: N/A;    There were no vitals filed for this visit.      Subjective Assessment - 05/28/16 1009    Subjective Patient feels stiffness in back and legs today. He is moving with less difficulty and feels he is benefitting from electrical stimulation.    Limitations Lifting;Standing;Walking;House hold activities;Other (comment)   Patient Stated Goals get back to full function for work and be able to walk without AD   Currently in Pain? No/denies        Objective:    Gait; forward flexed at hips, decreased trunk rotation, decreased DF right ankle with  heel strike, swing through right LE    Strength: hip flexion right 4/5, left 4/5, knee extension 4/5 bilaterally, knee flexion right 4-/5, left 4/5; knee flexion left 4/5, right 4-/5; ankle DF right 3-/5, left 4/5    Outcome measures; deferred  Treatment: Modalities: Electrical stimulation x 15 min.: russian stim. 10/10 cycle applied (2) electrodes: left quadriceps and high volt to medial/lateral aspect left knee and russian stim. right anterior tibialis (10/10 cycle)with intensity to tolerance/contraction with patient seated in chair with LE's elevated andperforming active quad sets with each cycle: goal: neuromuscular re education/pain; no adverse reactions noted  Therapeutic exercise:patient performed with VC, tactile cues and demonstration of therapist as needed  Standing; diagonal march holding (2) 4# weights in hands x 2 min. paloff press with 10# at cable OMEGA exercise machine standing side ways with each side towards cable Paloff press facing resistive band with blue resistive band pressing to chest and push away x 10 Lateral step up onto balance beam with balance stone on top (for increased challenge with balance/motor control):with hip flexion to as close to 90 degrees as able, then tap beam and repeat 90 degrees flexion x 15reps leading with each LE; using single or both UE's for support when standing on eitherLE and performing hip flexion patient states this exercise "helps a lot" Side stepping along balance beam 2 min. with UE support  and close supervision of therapist for safety  Seated strengthening exercises: rhythmic stabilization with manual resistance given for trunk flexion/extension x 10 reps, lateral resistance given x 10 reps knee extension 2 x 15 with 7# ankle weights, knee flexion with double blue/black resistive tubing 2 x 20  Patient response to treatment: Patient able to perform exercises with improved endurance and strength. He requires close supervision  for safety during all standing and walking exercises. Improved motor control left LE and right ankle   following estim.              PT Education - 05/28/16 1100    Education provided Yes   Education Details HEP; continue to perform standing, walking exercises: added diagonal walk with weights, Paloff press facing resistive band and standing sideways    Person(s) Educated Patient   Methods Explanation;Demonstration;Verbal cues;Handout   Comprehension Verbalized understanding;Returned demonstration;Verbal cues required             PT Long Term Goals - 05/28/16 1056      PT LONG TERM GOAL #1   Title Patient will be able to walk independently and safely with erect posture  on level surfaces >1200' and stairs by 06/23/2016   Baseline 02/05/2016 800' without AD  (limited to 4 min. due to fatigue, lower back pain) 05/12/16 1000' without AD; increased back fatigue; goal partially met: revise goal   Status On-going     PT LONG TERM GOAL #2   Title Patient will demonstrate improved function for daily activities with mild pain as indicated by modified oswestry score of 30% or less by 06/23/2016   Baseline MODI 62% (severe self perceived disability); MODI 12/05/15 50%; 01/12/2016 = 48%; 02/16/2016 45%; 03/30/16 50% self perceive disabiltiy; 45% 05/12/16   Status On-going     PT LONG TERM GOAL #3   Title Patient will demonstrate functional community ambulation and low fall rist by of 10 seconds or less by 06/23/2016   Baseline 12/09/15 11.5 seconds; 01/12/2016 deferred; 02/05/16 12.25 seconds; 03/30/2016 13 seconds; 05/12/16 12 seconds   Status On-going     PT LONG TERM GOAL #4   Title Patient will be independent with home program for exercises and self management by 06/23/2016 to continue with progressive healing once discharged from physical therapy    Baseline  limited knowledge of appropriate exercises and progression and requires assistance for performing exercises and progressing  intensity appropriately   Status On-going               Plan - 05/28/16 1011    Clinical Impression Statement Patient is progressing steadily towards goals with improving motor control, strength with current treatment. He continues with weakness in LE's and core s/p multi level fusion of lumbar spine and will require additional physical therapy interveniton to achiev maximal functional return.    Rehab Potential Good   PT Frequency 2x / week   PT Duration 6 weeks   PT Treatment/Interventions Patient/family education;Gait training;Cryotherapy;Therapeutic activities;Therapeutic exercise;Neuromuscular re-education;Scar mobilization;Manual techniques;Moist Heat;Electrical Stimulation   PT Next Visit Plan progress exercises, core control, gait training, weight shifting; electrical stimulation left LE/RLE   PT Home Exercise Plan walking, strengthening, ROM exercises as instructed      Patient will benefit from skilled therapeutic intervention in order to improve the following deficits and impairments:  Decreased strength, Decreased activity tolerance, Decreased knowledge of precautions, Pain, Decreased endurance, Decreased range of motion, Difficulty walking, Increased muscle spasms  Visit Diagnosis: Muscle weakness (generalized)  Difficulty in  walking, not elsewhere classified  Pain in both knees, unspecified chronicity       G-Codes - 06/02/2016 1053    Functional Assessment Tool Used (Outpatient Only) MODI, pain scale, ROM, strength deficits, clinical judgement   Functional Limitation Mobility: Walking and moving around   Mobility: Walking and Moving Around Current Status ([id number]) At least 40 percent but less than 60 percent impaired, limited or restricted   Mobility: Walking and Moving Around Goal Status [phone]) At least 1 percent but less than 20 percent impaired, limited or restricted      Problem List Patient Active Problem List   Diagnosis Date Noted  . Peripheral  polyneuropathy ([place]) 05/10/2016  . Body mass index (bmi) 33.0-33.9, adult 04/28/2016  . Cellulitis 11/27/2015  . Pedal edema 11/27/2015  . Adjustment disorder with depressed mood   . Osteoarthritis of spine with radiculopathy, lumbar region 10/15/2015  . Abnormal MRI   . Slurred speech   . Surgery, elective   . Loose stools   . OSA (obstructive sleep apnea)   . Acute blood loss anemia   . Pneumonia   . Urinary retention   . Lewy body dementia   . Encephalopathy   . Altered mental status   . Acute encephalopathy 10/12/2015  . Hypokalemia 10/12/2015  . Anemia 10/12/2015  . S/P lumbar fusion 10/12/2015  . Acute respiratory failure ([place])   . Degenerative disc disease, lumbar 10/01/2015  . Sciatica of right side associated with disorder of lumbosacral spine 07/21/2015  . Hypertension 02/18/2015  . Diabetes ([place]) 02/18/2015  . Gout 02/18/2015  . Hyperlipidemia 02/18/2015  . Arthritis 02/18/2015  . Encounter to establish care 02/18/2015    Jomarie Longs PT 02-Jun-2016, 7:56 PM  [facility] [address] [place], [address] Phone: [phone]   Fax:  [phone]  Name: Danny Patterson MRN: [id number] Date of Birth: January 22, 1946

## 2016-05-31 ENCOUNTER — Other Ambulatory Visit: Payer: Self-pay | Admitting: Family Medicine

## 2016-05-31 ENCOUNTER — Telehealth: Payer: Self-pay

## 2016-05-31 DIAGNOSIS — G629 Polyneuropathy, unspecified: Secondary | ICD-10-CM

## 2016-05-31 MED ORDER — GABAPENTIN 300 MG PO CAPS: 300.0000 mg | ORAL_CAPSULE | Freq: Three times a day (TID) | ORAL | 6 refills | Status: DC

## 2016-05-31 NOTE — Telephone Encounter (Signed)
I would leave the dose here for another month or so.  If he is still having more foot pain then, dose could be increased more then.  I will send in the 300 mg. Caps, to take 1 three times a day with refills.-jh

## 2016-05-31 NOTE — Telephone Encounter (Signed)
Daughter called stating that Danny Patterson was told by Dr. JuanettJillyn Hiddena GoslingHawkins to slowly increase gabapentin.  Patient is currently taking 300mg  tid (three times a day).  Please advise patient and patient is in need of a refill ASAP.

## 2016-05-31 NOTE — Telephone Encounter (Signed)
Left message on home # no change in therapy, refill submitted.[place]

## 2016-06-02 ENCOUNTER — Ambulatory Visit: Payer: PPO | Admitting: Physical Therapy

## 2016-06-02 ENCOUNTER — Telehealth: Payer: Self-pay | Admitting: *Deleted

## 2016-06-02 ENCOUNTER — Encounter: Payer: Self-pay | Admitting: Physical Therapy

## 2016-06-02 DIAGNOSIS — M25561 Pain in right knee: Secondary | ICD-10-CM

## 2016-06-02 DIAGNOSIS — R262 Difficulty in walking, not elsewhere classified: Secondary | ICD-10-CM

## 2016-06-02 DIAGNOSIS — M25562 Pain in left knee: Secondary | ICD-10-CM

## 2016-06-02 DIAGNOSIS — M6281 Muscle weakness (generalized): Secondary | ICD-10-CM | POA: Diagnosis not present

## 2016-06-02 NOTE — Telephone Encounter (Signed)
[facility] thru [employer] has approved Methocarbmol 500 mg from 05/31/16 --03/21/17.

## 2016-06-03 NOTE — Therapy (Signed)
[place] [facility] [address], [place], [address] Phone: [phone]   Fax:  [phone]  Physical Therapy Treatment  Patient Details  Name: Danny Patterson MRN: [id number] Date of Birth: February 11, 1946 Referring Provider: Floyce Stakes MD  Encounter Date: 06/02/2016      PT End of Session - 06/02/16 1115    Visit Number 61   Number of Visits 72   Date for PT Re-Evaluation 06/23/16   Authorization Type 61   Authorization Time Period 30 (G code)   PT Start Time 1027   PT Stop Time 1118   PT Time Calculation (min) 51 min   Activity Tolerance Patient tolerated treatment well;Patient limited by fatigue   Behavior During Therapy [facility] for tasks assessed/performed      Past Medical History:  Diagnosis Date  . Allergy   . Arthritis   . Diabetes mellitus without complication ([place])   . Gout   . Hyperlipidemia   . Hypertension   . Kidney stone   . Rheumatic fever     Past Surgical History:  Procedure Laterality Date  . BACK SURGERY    . knot     removed from neck  . POSTERIOR LUMBAR FUSION 4 LEVEL N/A 10/01/2015   Procedure: Lumbar three-four,  Lumbar four-five,  Lumbar five-Sacrum one posterior lumbar interbody fusion with Laminotomy at Lumbar Two-Three;  Surgeon: Leeroy Cha, MD;  Location: [place] [facility];  Service: Neurosurgery;  Laterality: N/A;    There were no vitals filed for this visit.      Subjective Assessment - 06/02/16 0935    Subjective Patient feels stiffness in back and legs today. He is moving with less difficulty and feels he is benefitting from electrical stimulation.    Limitations Lifting;Standing;Walking;House hold activities;Other (comment)   Patient Stated Goals get back to full function for work and be able to walk without AD   Currently in Pain? Yes   Pain Score 3    Pain Location Knee   Pain Orientation Left   Pain Descriptors / Indicators Aching   Pain Type Chronic pain   Pain Onset More than a month ago   Pain Frequency Intermittent     Objective: Gait: ambulating independently with forward flexed posture, mild steppage gait right LE with decreased foot DF Strength; decreased left hip flexion 4-/5, knee extension 4-/5 bilaterally, knee flexion 4/5 bilaterally    Treatment: Modalities: Electrical stimulation x 15 min.: russian stim. 10/10 cycle applied (2) electrodes: left quadriceps and high volt to medial/lateral aspect left knee and russian stim. right anterior tibialis (10/10 cycle)with intensity to tolerance/contraction with patient seated in chair with LE's elevated andperforming active quad sets with each cycle: goal: neuromuscular re education/pain; no adverse reactions noted  Therapeutic exercise:patient performed with VC, tactile cues and demonstration of therapist as needed  Standing; Balance stone walking x 2 min. With UE support at counter and close supervision x 1 for safety Lateral step up onto balance beam with balance stone on top (for increased challenge with balance/motor control): hip flexion to as close to 90 degrees as able, then tap beam and repeat 90 degrees flexion x 15reps leading with each LE; using single or both UE's for support when standing on eitherLE and performing hip flexion  Side stepping along balance beam 2 min. with UE support and close supervision of therapist for safety  Seated strengthening exercises: knee extension 2 x 15 with 7# ankle weights knee flexion with  double blue/black resistive tubing 2 x 20 Hip abduction with manual resistance of therapist x 15 reps     Patient response to treatment: Patient demonstrated improved technique with exercises with minimal VC for correct alignment. He required close supervision for safety during all standing exercises. He demonstrated improved motor control with repetition and cuing with all exercises. Fatigue noted with standing exercises.         PT  Education - 06/02/16 1045    Education provided Yes   Education Details HEP; re assessed home program   Person(s) Educated Patient   Methods Explanation   Comprehension Verbalized understanding             PT Long Term Goals - 05/28/16 1056      PT LONG TERM GOAL #1   Title Patient will be able to walk independently and safely with erect posture  on level surfaces >1200' and stairs by 06/23/2016   Baseline 02/05/2016 800' without AD  (limited to 4 min. due to fatigue, lower back pain) 05/12/16 1000' without AD; increased back fatigue; goal partially met: revise goal   Status On-going     PT LONG TERM GOAL #2   Title Patient will demonstrate improved function for daily activities with mild pain as indicated by modified oswestry score of 30% or less by 06/23/2016   Baseline MODI 62% (severe self perceived disability); MODI 12/05/15 50%; 01/12/2016 = 48%; 02/16/2016 45%; 03/30/16 50% self perceive disabiltiy; 45% 05/12/16   Status On-going     PT LONG TERM GOAL #3   Title Patient will demonstrate functional community ambulation and low fall rist by 10MW of 10 seconds or less by 06/23/2016   Baseline 12/09/15 11.5 seconds; 01/12/2016 deferred; 02/05/16 12.25 seconds; 03/30/2016 13 seconds; 05/12/16 12 seconds   Status On-going     PT LONG TERM GOAL #4   Title Patient will be independent with home program for exercises and self management by 06/23/2016 to continue with progressive healing once discharged from physical therapy    Baseline  limited knowledge of appropriate exercises and progression and requires assistance for performing exercises and progressing intensity appropriately   Status On-going               Plan - 06/02/16 1200    Clinical Impression Statement Patient with increased pain today which limited exercises performed. He was able to perform all exercises with minimal VC and assistance. He continues with limitations of decreased strength, endurance in core and LE's and will  benefit from continued phyiscal therapy intervention to achieve maximal gains for functional tasks, ambulation.    Rehab Potential Good   PT Frequency 2x / week   PT Duration 6 weeks   PT Treatment/Interventions Patient/family education;Gait training;Cryotherapy;Therapeutic activities;Therapeutic exercise;Neuromuscular re-education;Scar mobilization;Manual techniques;Moist Heat;Electrical Stimulation   PT Next Visit Plan progress exercises, core control, gait training, weight shifting; electrical stimulation left LE/RLE   PT Home Exercise Plan walking, strengthening, ROM exercises as instructed      Patient will benefit from skilled therapeutic intervention in order to improve the following deficits and impairments:  Decreased strength, Decreased activity tolerance, Decreased knowledge of precautions, Pain, Decreased endurance, Decreased range of motion, Difficulty walking, Increased muscle spasms  Visit Diagnosis: Muscle weakness (generalized)  Difficulty in walking, not elsewhere classified  Pain in both knees, unspecified chronicity     Problem List Patient Active Problem List   Diagnosis Date Noted  . Peripheral polyneuropathy ([place]) 05/10/2016  . Body mass index (bmi) 33.0-33.9, adult  04/28/2016  . Cellulitis 11/27/2015  . Pedal edema 11/27/2015  . Adjustment disorder with depressed mood   . Osteoarthritis of spine with radiculopathy, lumbar region 10/15/2015  . Abnormal MRI   . Slurred speech   . Surgery, elective   . Loose stools   . OSA (obstructive sleep apnea)   . Acute blood loss anemia   . Pneumonia   . Urinary retention   . Lewy body dementia   . Encephalopathy   . Altered mental status   . Acute encephalopathy 10/12/2015  . Hypokalemia 10/12/2015  . Anemia 10/12/2015  . S/P lumbar fusion 10/12/2015  . Acute respiratory failure ([place])   . Degenerative disc disease, lumbar 10/01/2015  . Sciatica of right side associated with disorder of lumbosacral spine  07/21/2015  . Hypertension 02/18/2015  . Diabetes ([place]) 02/18/2015  . Gout 02/18/2015  . Hyperlipidemia 02/18/2015  . Arthritis 02/18/2015  . Encounter to establish care 02/18/2015    Jomarie Longs PT 06/03/2016, 4:00 PM  [place] [facility] [address] [place], [address] Phone: [phone]   Fax:  [phone]  Name: Danny Patterson MRN: [id number] Date of Birth: 09/30/1945

## 2016-06-04 ENCOUNTER — Ambulatory Visit: Payer: PPO | Admitting: Physical Therapy

## 2016-06-04 ENCOUNTER — Encounter: Payer: Self-pay | Admitting: Physical Therapy

## 2016-06-04 DIAGNOSIS — R262 Difficulty in walking, not elsewhere classified: Secondary | ICD-10-CM

## 2016-06-04 DIAGNOSIS — M6281 Muscle weakness (generalized): Secondary | ICD-10-CM | POA: Diagnosis not present

## 2016-06-04 DIAGNOSIS — M25562 Pain in left knee: Secondary | ICD-10-CM

## 2016-06-04 DIAGNOSIS — M25561 Pain in right knee: Secondary | ICD-10-CM

## 2016-06-04 NOTE — Therapy (Signed)
[facility] [address] [place], [address] Phone: [phone]   Fax:  [phone]  Physical Therapy Treatment  Patient Details  Name: Danny Patterson MRN: [id number] Date of Birth: 23-Jan-1946 Referring Provider: Karn Cassis MD  Encounter Date: 06/04/2016      PT End of Session - 06/04/16 1036    Visit Number 62   Number of Visits 72   Date for PT Re-Evaluation 06/23/16   Authorization Type 62   Authorization Time Period 68 (G code)   PT Start Time 0935   PT Stop Time 1030   PT Time Calculation (min) 55 min   Activity Tolerance Patient tolerated treatment well;Patient limited by fatigue   Behavior During Therapy [facility] for tasks assessed/performed      Past Medical History:  Diagnosis Date  . Allergy   . Arthritis   . Diabetes mellitus without complication ([facility])   . Gout   . Hyperlipidemia   . Hypertension   . Kidney stone   . Rheumatic fever     Past Surgical History:  Procedure Laterality Date  . BACK SURGERY    . knot     removed from neck  . POSTERIOR LUMBAR FUSION 4 LEVEL N/A 10/01/2015   Procedure: Lumbar three-four,  Lumbar four-five,  Lumbar five-Sacrum one posterior lumbar interbody fusion with Laminotomy at Lumbar Two-Three;  Surgeon: Hilda Lias, MD;  Location: [facility];  Service: Neurosurgery;  Laterality: N/A;    There were no vitals filed for this visit.      Subjective Assessment - 06/04/16 0945    Subjective Patient reports feeling better today than the other day.    Limitations Lifting;Standing;Walking;House hold activities;Other (comment)   Patient Stated Goals get back to full function for work and be able to walk without AD   Currently in Pain? No/denies       Treatment: Modalities: Electrical stimulation x 15 min.: russian stim. 10/10 cycle applied (2) electrodes: left quadriceps and high volt to medial/lateral aspect left knee and russian stim. right  anterior tibialis (10/10 cycle)with intensity to tolerance/contraction with patient seated in chair with LE's elevated andperforming active quad sets with each cycle: goal: neuromuscular re education/pain; no adverse reactions noted  Therapeutic exercise:patient performed with VC, tactile cues and demonstration of therapist as needed  Standing; diagonal march holding (2) 4# weights in hands x 2 min. Lateral step up onto balance beam with balance stone on top (for increased challenge with balance/motor control):with hip flexion to as close to 90 degrees as able, then tap beam and repeat 90 degrees flexion x 15reps leading with each LE; using single or both UE's for support when standing on eitherLE and performing hip flexion  Side stepping along balance beam 2 min. with UE support and close supervision of therapist for safety Forward step ups onto balance stone on top of balance beam (airex) leading with each LE x 10 reps  Seated strengthening exercises: Paloff press facing resistive band with blue/black resistive band pressing to chest and push away x 10 then holding band to chest with therapist performing pull and pushing resistive band pallof press x 10 reps knee extension 2 x 15 with 71/2# ankle weights, knee flexion with double blue/black resistive tubing 2 x 20      Patient response to treatment: patient demonstrated improved technique with exercises with minimal VC for correct alignment. Patient with mild fatigue with exercises. Improved motor control with repetition and  cuing, following estim.          PT Education - 06/04/16 0945    Education provided Yes   Education Details HEP re assessed for standing and walking exercises   Person(s) Educated Patient   Methods Explanation   Comprehension Verbalized understanding             PT Long Term Goals - 05/28/16 1056      PT LONG TERM GOAL #1   Title Patient will be able to walk independently and safely with erect  posture  on level surfaces >1200' and stairs by 06/23/2016   Baseline 02/05/2016 800' without AD  (limited to 4 min. due to fatigue, lower back pain) 05/12/16 1000' without AD; increased back fatigue; goal partially met: revise goal   Status On-going     PT LONG TERM GOAL #2   Title Patient will demonstrate improved function for daily activities with mild pain as indicated by modified oswestry score of 30% or less by 06/23/2016   Baseline MODI 62% (severe self perceived disability); MODI 12/05/15 50%; 01/12/2016 = 48%; 02/16/2016 45%; 03/30/16 50% self perceive disabiltiy; 45% 05/12/16   Status On-going     PT LONG TERM GOAL #3   Title Patient will demonstrate functional community ambulation and low fall rist by 10MW of 10 seconds or less by 06/23/2016   Baseline 12/09/15 11.5 seconds; 01/12/2016 deferred; 02/05/16 12.25 seconds; 03/30/2016 13 seconds; 05/12/16 12 seconds   Status On-going     PT LONG TERM GOAL #4   Title Patient will be independent with home program for exercises and self management by 06/23/2016 to continue with progressive healing once discharged from physical therapy    Baseline  limited knowledge of appropriate exercises and progression and requires assistance for performing exercises and progressing intensity appropriately   Status On-going               Plan - 06/04/16 1036    Clinical Impression Statement Patient able to perform all exercises with minimal verbal cuing and mild fatigue. He continues with limitations of decresaed strength, endurance LE's and core and will continue to benefit from additional physical therapy intervention to achieve goals.    Rehab Potential Good   PT Frequency 2x / week   PT Duration 6 weeks   PT Treatment/Interventions Patient/family education;Gait training;Cryotherapy;Therapeutic activities;Therapeutic exercise;Neuromuscular re-education;Scar mobilization;Manual techniques;Moist Heat;Electrical Stimulation   PT Next Visit Plan progress  exercises, core control, gait training, weight shifting; electrical stimulation left LE/RLE   PT Home Exercise Plan walking, strengthening, ROM exercises as instructed      Patient will benefit from skilled therapeutic intervention in order to improve the following deficits and impairments:  Decreased strength, Decreased activity tolerance, Decreased knowledge of precautions, Pain, Decreased endurance, Decreased range of motion, Difficulty walking, Increased muscle spasms  Visit Diagnosis: Muscle weakness (generalized)  Difficulty in walking, not elsewhere classified  Pain in both knees, unspecified chronicity     Problem List Patient Active Problem List   Diagnosis Date Noted  . Peripheral polyneuropathy ([place]) 05/10/2016  . Body mass index (bmi) 33.0-33.9, adult 04/28/2016  . Cellulitis 11/27/2015  . Pedal edema 11/27/2015  . Adjustment disorder with depressed mood   . Osteoarthritis of spine with radiculopathy, lumbar region 10/15/2015  . Abnormal MRI   . Slurred speech   . Surgery, elective   . Loose stools   . OSA (obstructive sleep apnea)   . Acute blood loss anemia   . Pneumonia   . Urinary  retention   . Lewy body dementia   . Encephalopathy   . Altered mental status   . Acute encephalopathy 10/12/2015  . Hypokalemia 10/12/2015  . Anemia 10/12/2015  . S/P lumbar fusion 10/12/2015  . Acute respiratory failure ([place])   . Degenerative disc disease, lumbar 10/01/2015  . Sciatica of right side associated with disorder of lumbosacral spine 07/21/2015  . Hypertension 02/18/2015  . Diabetes ([place]) 02/18/2015  . Gout 02/18/2015  . Hyperlipidemia 02/18/2015  . Arthritis 02/18/2015  . Encounter to establish care 02/18/2015    Jomarie Longs PT 06/04/2016, 5:15 PM  [place] [facility] [address] [place], [address] Phone: [phone]   Fax:  [phone]  Name: Danny Patterson MRN: [id number] Date of  Birth: [birth year]

## 2016-06-07 ENCOUNTER — Ambulatory Visit: Payer: PPO | Admitting: Physical Therapy

## 2016-06-07 ENCOUNTER — Encounter: Payer: Self-pay | Admitting: Physical Therapy

## 2016-06-07 DIAGNOSIS — R262 Difficulty in walking, not elsewhere classified: Secondary | ICD-10-CM

## 2016-06-07 DIAGNOSIS — M6281 Muscle weakness (generalized): Secondary | ICD-10-CM | POA: Diagnosis not present

## 2016-06-07 DIAGNOSIS — M25561 Pain in right knee: Secondary | ICD-10-CM

## 2016-06-07 DIAGNOSIS — M25562 Pain in left knee: Secondary | ICD-10-CM

## 2016-06-07 NOTE — Therapy (Signed)
[place] [facility] [address], [place], [address] Phone: [phone]   Fax:  [phone]  Physical Therapy Treatment  Patient Details  Name: Danny Patterson MRN: [id number] Date of Birth: 1945/04/17 Referring Provider: Floyce Stakes MD  Encounter Date: 06/07/2016      PT End of Session - 06/07/16 1000    Visit Number 33   Number of Visits 72   Date for PT Re-Evaluation 06/23/16   Authorization Type 71   Authorization Time Period 39 (G code)   PT Start Time [phone]   PT Stop Time 1031   PT Time Calculation (min) 45 min   Activity Tolerance Patient tolerated treatment well;Patient limited by fatigue   Behavior During Therapy [facility] for tasks assessed/performed      Past Medical History:  Diagnosis Date  . Allergy   . Arthritis   . Diabetes mellitus without complication ([place])   . Gout   . Hyperlipidemia   . Hypertension   . Kidney stone   . Rheumatic fever     Past Surgical History:  Procedure Laterality Date  . BACK SURGERY    . knot     removed from neck  . POSTERIOR LUMBAR FUSION 4 LEVEL N/A 10/01/2015   Procedure: Lumbar three-four,  Lumbar four-five,  Lumbar five-Sacrum one posterior lumbar interbody fusion with Laminotomy at Lumbar Two-Three;  Surgeon: Leeroy Cha, MD;  Location: [place] [facility];  Service: Neurosurgery;  Laterality: N/A;    There were no vitals filed for this visit.      Subjective Assessment - 06/07/16 0957    Subjective Patient reports he had a busy weekend and is feeling more tired lately.    Limitations Lifting;Standing;Walking;House hold activities;Other (comment)   Patient Stated Goals get back to full function for work and be able to walk without AD   Currently in Pain? No/denies        Objective;    Gait; independent without AD, no LOB, steppage gait right LE, more erect posture than previous session    Strength; decreased hip flexion bilaterally 4-/5, right ankle  DF 3-/5    Treatment: Modalities: Electrical stimulation x 60mn.: russian stim. 10/10 cycle applied (2) electrodes: left quadriceps and high volt to medial/lateral aspect left knee and russian stim. right anterior tibialis (10/10 cycle)with intensity to tolerance/contraction with patient seated in chair with LE's elevated andperforming active quad sets with each cycle: goal: neuromuscular re education/pain; no adverse reactions noted  Therapeutic exercise:patient performed with VC, tactile cues and demonstration of therapist as needed  Standing; Walk along balance stones x 5 sets; last pass without holding onto counter/chair Lateral step up onto balance beam with balance stone on top (for increased challenge with balance/motor control):with hip flexion to as close to 90 degrees as able, then tap beam and repeat 90 degrees flexion x 15reps leading with each LE; using single or both UE's for support when standing on eitherLE and performing hip flexion  Side stepping along balance beam 241m. with UE support and close supervision of therapist for safety Forward step ups onto balance stone on top of balance beam (airex) leading with each LE x 10 reps  Seated strengthening exercises: knee extension 2 x 15 with 7 1/2# ankle weights, knee flexion with double blue/blackresistive tubing2 x 20    Patient response to treatment: Patient demonstrated improved balance with decreased UE support with walking on balance beam. He requires minimal VC for correct technique and  close supervision for safety. Improved quadriceps control, contraction following electrical stimulation.         PT Education - 06/07/16 0959    Education provided Yes   Education Details reviewed the use of his back brace as needed when doing more strenous activity or being up more so he doesn't get as tired.    Person(s) Educated Patient   Methods Explanation   Comprehension Verbalized understanding              PT Long Term Goals - 05/28/16 1056      PT LONG TERM GOAL #1   Title Patient will be able to walk independently and safely with erect posture  on level surfaces >1200' and stairs by 06/23/2016   Baseline 02/05/2016 800' without AD  (limited to 4 min. due to fatigue, lower back pain) 05/12/16 1000' without AD; increased back fatigue; goal partially met: revise goal   Status On-going     PT LONG TERM GOAL #2   Title Patient will demonstrate improved function for daily activities with mild pain as indicated by modified oswestry score of 30% or less by 06/23/2016   Baseline MODI 62% (severe self perceived disability); MODI 12/05/15 50%; 01/12/2016 = 48%; 02/16/2016 45%; 03/30/16 50% self perceive disabiltiy; 45% 05/12/16   Status On-going     PT LONG TERM GOAL #3   Title Patient will demonstrate functional community ambulation and low fall rist by 10MW of 10 seconds or less by 06/23/2016   Baseline 12/09/15 11.5 seconds; 01/12/2016 deferred; 02/05/16 12.25 seconds; 03/30/2016 13 seconds; 05/12/16 12 seconds   Status On-going     PT LONG TERM GOAL #4   Title Patient will be independent with home program for exercises and self management by 06/23/2016 to continue with progressive healing once discharged from physical therapy    Baseline  limited knowledge of appropriate exercises and progression and requires assistance for performing exercises and progressing intensity appropriately   Status On-going               Plan - 06/07/16 1005    Clinical Impression Statement Patient demonstrates improving endurance with exercises with minimal verbal cuing and mild fatigue. He continues with limitations of decreased strength and endurance for functional tasks at home and in community and will benefit from addtional physical therapy intervention to addresss these.    Rehab Potential Good   PT Frequency 2x / week   PT Duration 6 weeks   PT Treatment/Interventions Patient/family education;Gait  training;Cryotherapy;Therapeutic activities;Therapeutic exercise;Neuromuscular re-education;Scar mobilization;Manual techniques;Moist Heat;Electrical Stimulation   PT Next Visit Plan progress exercises, core control, gait training, weight shifting; electrical stimulation left LE/RLE   PT Home Exercise Plan walking, strengthening, ROM exercises as instructed      Patient will benefit from skilled therapeutic intervention in order to improve the following deficits and impairments:  Decreased strength, Decreased activity tolerance, Decreased knowledge of precautions, Pain, Decreased endurance, Decreased range of motion, Difficulty walking, Increased muscle spasms  Visit Diagnosis: Muscle weakness (generalized)  Difficulty in walking, not elsewhere classified  Pain in both knees, unspecified chronicity     Problem List Patient Active Problem List   Diagnosis Date Noted  . Peripheral polyneuropathy ([place]) 05/10/2016  . Body mass index (bmi) 33.0-33.9, adult 04/28/2016  . Cellulitis 11/27/2015  . Pedal edema 11/27/2015  . Adjustment disorder with depressed mood   . Osteoarthritis of spine with radiculopathy, lumbar region 10/15/2015  . Abnormal MRI   . Slurred speech   . Surgery,  elective   . Loose stools   . OSA (obstructive sleep apnea)   . Acute blood loss anemia   . Pneumonia   . Urinary retention   . Lewy body dementia   . Encephalopathy   . Altered mental status   . Acute encephalopathy 10/12/2015  . Hypokalemia 10/12/2015  . Anemia 10/12/2015  . S/P lumbar fusion 10/12/2015  . Acute respiratory failure ([place])   . Degenerative disc disease, lumbar 10/01/2015  . Sciatica of right side associated with disorder of lumbosacral spine 07/21/2015  . Hypertension 02/18/2015  . Diabetes ([place]) 02/18/2015  . Gout 02/18/2015  . Hyperlipidemia 02/18/2015  . Arthritis 02/18/2015  . Encounter to establish care 02/18/2015    Jomarie Longs PT 06/07/2016, 9:28 PM  [place] [facility] [address] [place], [address] Phone: [phone]   Fax:  [phone]  Name: Danny Patterson MRN: [id number] Date of Birth: 05-Feb-1946

## 2016-06-09 ENCOUNTER — Ambulatory Visit: Payer: PPO | Admitting: Physical Therapy

## 2016-06-14 ENCOUNTER — Ambulatory Visit: Payer: PPO | Admitting: Physical Therapy

## 2016-06-18 ENCOUNTER — Ambulatory Visit: Payer: PPO | Admitting: Physical Therapy

## 2016-06-18 ENCOUNTER — Encounter: Payer: Self-pay | Admitting: Physical Therapy

## 2016-06-18 DIAGNOSIS — R262 Difficulty in walking, not elsewhere classified: Secondary | ICD-10-CM

## 2016-06-18 DIAGNOSIS — M6281 Muscle weakness (generalized): Secondary | ICD-10-CM | POA: Diagnosis not present

## 2016-06-18 DIAGNOSIS — M25562 Pain in left knee: Secondary | ICD-10-CM

## 2016-06-18 DIAGNOSIS — M25561 Pain in right knee: Secondary | ICD-10-CM

## 2016-06-18 NOTE — Therapy (Signed)
[facility] [address] [place], [address] Phone: [phone]   Fax:  [phone]  Physical Therapy Treatment  Patient Details  Name: Danny Patterson MRN: [id number] Date of Birth: [birth year] Referring Provider: Floyce Stakes MD  Encounter Date: 06/18/2016      PT End of Session - 06/18/16 0900    Visit Number 64   Number of Visits 72   Date for PT Re-Evaluation 06/23/16   Authorization Type [id number]   Authorization Time Period 53 (G code)   PT Start Time [id number]   PT Stop Time 0932   PT Time Calculation (min) 46 min   Activity Tolerance Patient tolerated treatment well;Patient limited by fatigue   Behavior During Therapy [facility] for tasks assessed/performed      Past Medical History:  Diagnosis Date  . Allergy   . Arthritis   . Diabetes mellitus without complication ([place])   . Gout   . Hyperlipidemia   . Hypertension   . Kidney stone   . Rheumatic fever     Past Surgical History:  Procedure Laterality Date  . BACK SURGERY    . knot     removed from neck  . POSTERIOR LUMBAR FUSION 4 LEVEL N/A 10/01/2015   Procedure: Lumbar three-four,  Lumbar four-five,  Lumbar five-Sacrum one posterior lumbar interbody fusion with Laminotomy at Lumbar Two-Three;  Surgeon: Leeroy Cha, MD;  Location: [facility];  Service: Neurosurgery;  Laterality: N/A;    There were no vitals filed for this visit.      Subjective Assessment - 06/18/16 0858    Subjective Patient reports he had a busy weekend and is feeling more tired lately.    Limitations Lifting;Standing;Walking;House hold activities;Other (comment)   Patient Stated Goals get back to full function for work and be able to walk without AD   Currently in Pain? No/denies  reports only some soreness in back and left knee         Objective;    Gait; independent without AD, no LOB, mild steppage gait right LE, more erect posture than previous session  Strength; decreased hip flexion bilaterally 4-/5, right ankle DF 3-/5    Treatment: Modalities: Electrical stimulation x 35mn.: russian stim. 10/10 cycle applied (2) electrodes: left quadriceps and high volt to medial/lateral aspect left knee and russian stim. right anterior tibialis (10/10 cycle)with intensity to tolerance/contraction with patient seated in chair with LE's elevated andperforming active quad sets with each cycle; moist heat applied to lower back and left quadriceps, knee during estim.: goal: neuromuscular re education/pain; no adverse reactions noted  Therapeutic exercise:patient performed with VC, tactile cues and demonstration of therapist as needed  Standing; Side step along balance beam x 2 min Lateral step up onto balance beam with balance stone on top (for increased challenge with balance/motor control):with hip flexion to as close to 90 degrees as able, then tap beam and repeat 90 degrees flexion x 15reps leading with each LE; using single or both UE's for support when standing on eitherLE and performing hip flexion  Shoulder extension each UE with red resistive band with VC and demonstration x 15 reps  Seated strengthening exercises: knee extension 2 x 20 with 5# ankle weights, knee flexion with double blue/blackresistive tubing2 x 20 Hip abduction with manual graded resistance x 10 reps Core strengthening/stabilization: resistive band singe arm row x 15 right UE Bilateral straight arm pull downs x 15 Pallof press forward x 15 Lumbar  extension in sitting with red resistive band and assistance of therapist x 15 reps    Patient response to treatment: Patient reported feeling he was able to stand straighter with less effort following exercises in sitting for stabilization. Improved balance with walking exercises with decreased use of UE's. Improved left knee pain and able to perform exercises with greater ease following estim/moist heat treatment prior to  exercise.           PT Education - 06/18/16 0925    Education provided Yes   Education Details HEP re assessed for core in standing; shoulder extension, pallof press, lumbar extension with resistive band   Person(s) Educated Patient   Methods Explanation;Demonstration;Verbal cues   Comprehension Verbalized understanding;Returned demonstration;Verbal cues required             PT Long Term Goals - 05/28/16 1056      PT LONG TERM GOAL #1   Title Patient will be able to walk independently and safely with erect posture  on level surfaces >1200' and stairs by 06/23/2016   Baseline 02/05/2016 800' without AD  (limited to 4 min. due to fatigue, lower back pain) 05/12/16 1000' without AD; increased back fatigue; goal partially met: revise goal   Status On-going     PT LONG TERM GOAL #2   Title Patient will demonstrate improved function for daily activities with mild pain as indicated by modified oswestry score of 30% or less by 06/23/2016   Baseline MODI 62% (severe self perceived disability); MODI 12/05/15 50%; 01/12/2016 = 48%; 02/16/2016 45%; 03/30/16 50% self perceive disabiltiy; 45% 05/12/16   Status On-going     PT LONG TERM GOAL #3   Title Patient will demonstrate functional community ambulation and low fall rist by 10MW of 10 seconds or less by 06/23/2016   Baseline 12/09/15 11.5 seconds; 01/12/2016 deferred; 02/05/16 12.25 seconds; 03/30/2016 13 seconds; 05/12/16 12 seconds   Status On-going     PT LONG TERM GOAL #4   Title Patient will be independent with home program for exercises and self management by 06/23/2016 to continue with progressive healing once discharged from physical therapy    Baseline  limited knowledge of appropriate exercises and progression and requires assistance for performing exercises and progressing intensity appropriately   Status On-going               Plan - 06/18/16 0901    Clinical Impression Statement Patient is progressing well with improved posture  with standing and walking. He continues with decreased endurance with prolonged standing and left knee pain with standing, walking. Plan: re assess next session and determine further needs.    Rehab Potential Good   PT Frequency 2x / week   PT Duration 6 weeks   PT Treatment/Interventions Patient/family education;Gait training;Cryotherapy;Therapeutic activities;Therapeutic exercise;Neuromuscular re-education;Scar mobilization;Manual techniques;Moist Heat;Electrical Stimulation   PT Next Visit Plan progress exercises, core control, gait training, weight shifting; electrical stimulation left LE/RLE   PT Home Exercise Plan walking, strengthening, ROM exercises as instructed      Patient will benefit from skilled therapeutic intervention in order to improve the following deficits and impairments:  Decreased strength, Decreased activity tolerance, Decreased knowledge of precautions, Pain, Decreased endurance, Decreased range of motion, Difficulty walking, Increased muscle spasms  Visit Diagnosis: Muscle weakness (generalized)  Difficulty in walking, not elsewhere classified  Pain in both knees, unspecified chronicity     Problem List Patient Active Problem List   Diagnosis Date Noted  . Peripheral polyneuropathy ([place]) 05/10/2016  .  Body mass index (bmi) 33.0-33.9, adult 04/28/2016  . Cellulitis 11/27/2015  . Pedal edema 11/27/2015  . Adjustment disorder with depressed mood   . Osteoarthritis of spine with radiculopathy, lumbar region 10/15/2015  . Abnormal MRI   . Slurred speech   . Surgery, elective   . Loose stools   . OSA (obstructive sleep apnea)   . Acute blood loss anemia   . Pneumonia   . Urinary retention   . Lewy body dementia   . Encephalopathy   . Altered mental status   . Acute encephalopathy 10/12/2015  . Hypokalemia 10/12/2015  . Anemia 10/12/2015  . S/P lumbar fusion 10/12/2015  . Acute respiratory failure ([place])   . Degenerative disc disease, lumbar 10/01/2015   . Sciatica of right side associated with disorder of lumbosacral spine 07/21/2015  . Hypertension 02/18/2015  . Diabetes ([place]) 02/18/2015  . Gout 02/18/2015  . Hyperlipidemia 02/18/2015  . Arthritis 02/18/2015  . Encounter to establish care 02/18/2015    Jomarie Longs PT 06/18/2016, 11:01 PM  [facility] [address], [place], [address] Phone: [phone]   Fax:  [phone]  Name: Danny Patterson MRN: [id number] Date of Birth: 1945-05-06

## 2016-06-23 ENCOUNTER — Ambulatory Visit: Payer: PPO | Attending: Neurosurgery | Admitting: Physical Therapy

## 2016-06-23 ENCOUNTER — Encounter: Payer: Self-pay | Admitting: Physical Therapy

## 2016-06-23 DIAGNOSIS — R262 Difficulty in walking, not elsewhere classified: Secondary | ICD-10-CM | POA: Diagnosis not present

## 2016-06-23 DIAGNOSIS — M25561 Pain in right knee: Secondary | ICD-10-CM | POA: Diagnosis not present

## 2016-06-23 DIAGNOSIS — M6281 Muscle weakness (generalized): Secondary | ICD-10-CM | POA: Diagnosis not present

## 2016-06-23 DIAGNOSIS — M25562 Pain in left knee: Secondary | ICD-10-CM | POA: Insufficient documentation

## 2016-06-23 NOTE — Therapy (Signed)
[facility] [address] [place], [address] Phone: [phone]   Fax:  [phone]  Physical Therapy Treatment/Discharge Summary  Patient Details  Name: Danny Patterson MRN: [id number] Date of Birth: Jul 05, 1945 Referring Provider: Floyce Stakes MD  Encounter Date: 06/23/2016   Patient began physical therapy 11/05/2015 and has attended 65 sessions through 06/23/16 with goals #3, 4 achieved and #1,2 partially met due to continued weakness and pain as he is still healing from multi level fusion of lumbar spine.       PT End of Session - 06/23/16 1019    Visit Number 65   Number of Visits 72   Date for PT Re-Evaluation 06/23/16   Authorization Type 72   Authorization Time Period 51 (G code)   PT Start Time 1005   PT Stop Time 1045   PT Time Calculation (min) 40 min   Activity Tolerance Patient tolerated treatment well;Patient limited by fatigue   Behavior During Therapy WFL for tasks assessed/performed      Past Medical History:  Diagnosis Date  . Allergy   . Arthritis   . Diabetes mellitus without complication ([place])   . Gout   . Hyperlipidemia   . Hypertension   . Kidney stone   . Rheumatic fever     Past Surgical History:  Procedure Laterality Date  . BACK SURGERY    . knot     removed from neck  . POSTERIOR LUMBAR FUSION 4 LEVEL N/A 10/01/2015   Procedure: Lumbar three-four,  Lumbar four-five,  Lumbar five-Sacrum one posterior lumbar interbody fusion with Laminotomy at Lumbar Two-Three;  Surgeon: Leeroy Cha, MD;  Location: [facility];  Service: Neurosurgery;  Laterality: N/A;    There were no vitals filed for this visit.      Subjective Assessment - 06/23/16 1014    Subjective Patient reports he is feeling pretty good today with strength and endurance slowly improving. He has good and bad days depdending on his activity. He agrees he is ready to continue on own and agrees to discharge  from physical therpay at this time.    Limitations Lifting;Standing;Walking;House hold activities;Other (comment)   Patient Stated Goals get back to full function for work and be able to walk without AD   Currently in Pain? No/denies          Objective; Gait; independent without AD, no LOB, mild steppage gait right LE, more erect posture than previous session Strength; decreased hip flexion bilaterally 4-/5, right ankle DF 3-/5    Outcome measures:    MODI 40% (0 = no self perceived disability) (initially was     10MW 10 seconds, 1 m/s; WFL for community ambulation  Treatment: Modalities: Electrical stimulation x 81mn.: russian stim. 10/10 cycle applied (2) electrodes: left quadriceps and high volt to medial/lateral aspect left knee and russian stim. right anterior tibialis (10/10 cycle)with intensity to tolerance/contraction with patient seated in chair with LE's elevated andperforming active quad sets with each cycle: goal: neuromuscular re education/pain; no adverse reactions noted; improved gait with reported decreased tenderness in left knee and improved quad control and right ankle control following estim.  Therapeutic exercise:patient performed with VC, tactile cues and demonstration of therapist as needed  Standing; Side step along balance beam x 2 min Lateral step up onto balance beam with balance stone on top (for increased challenge with balance/motor control):with hip flexion to as close to 90 degrees as able, then tap  beam and repeat 90 degrees flexion x 15reps leading with each LE; using single or both UE's for support when standing on eitherLE and performing hip flexion   Seated strengthening exercises: Core strengthening/stabilization: resistive band single arm row x 15 right UE Bilateral straight arm pull downs x 15 Pallof press forward x 15 Lumbar extension in sitting with red resistive band and assistance of therapist x 15 reps  Patient response  to treatment: Patient demonstrated good technique with mild fatigue with exercises with minimal to no VC. Improved balance with all standing exercises with UE support for safety.         PT Education - 06/23/16 1017    Education provided Yes   Education Details Energy conservation to build endurance: go outdoors in yard 30 min. then rest 1-2 hours and go back out 30 min. and build up gradually before feeling worn out   Person(s) Educated Patient   Methods Explanation   Comprehension Verbalized understanding             PT Long Term Goals - 06/23/16 1050      PT LONG TERM GOAL #1   Title Patient will be able to walk independently and safely with erect posture  on level surfaces >1200' and stairs by 06/23/2016   Baseline 02/05/2016 800' without AD  (limited to 4 min. due to fatigue, lower back pain) 05/12/16 1000' without AD; increased back fatigue; goal partially met: revise goal; no change 06/23/16   Status Partially Met     PT LONG TERM GOAL #2   Title Patient will demonstrate improved function for daily activities with mild pain as indicated by modified oswestry score of 30% or less by 06/23/2016   Baseline MODI 62% (severe self perceived disability); MODI 12/05/15 50%; 01/12/2016 = 48%; 02/16/2016 45%; 03/30/16 50% self perceive disabiltiy; 45% 05/12/16; 40% 06/23/16   Status Partially Met     PT LONG TERM GOAL #3   Title Patient will demonstrate functional community ambulation and low fall rist by 10MW of 10 seconds or less by 06/23/2016   Baseline 12/09/15 11.5 seconds; 01/12/2016 deferred; 02/05/16 12.25 seconds; 03/30/2016 13 seconds; 05/12/16 12 seconds; 06/23/16 10 seconds   Status Achieved     PT LONG TERM GOAL #4   Title Patient will be independent with home program for exercises and self management by 06/23/2016 to continue with progressive healing once discharged from physical therapy    Baseline  limited knowledge of appropriate exercises and progression and requires assistance for  performing exercises and progressing intensity appropriately; independent without cuing needed 06/23/16   Status Achieved               Plan - 06/23/16 1020    Clinical Impression Statement Patient is progressing slowly, steadily with strength and endurance. He verbalized good understanding of energy conservation and should continue to improve with continued exercises and self management with home program. His current impairment level is 35-40% bases on strength deficits, MODI, clinical judgement.    Rehab Potential Good   PT Frequency 2x / week   PT Duration 6 weeks   PT Treatment/Interventions Patient/family education;Gait training;Cryotherapy;Therapeutic activities;Therapeutic exercise;Neuromuscular re-education;Scar mobilization;Manual techniques;Moist Heat;Electrical Stimulation   PT Next Visit Plan Discharge to independent home program   PT Home Exercise Plan walking, strengthening, ROM exercises as instructed      Patient will benefit from skilled therapeutic intervention in order to improve the following deficits and impairments:  Decreased strength, Decreased activity tolerance, Decreased knowledge of  precautions, Pain, Decreased endurance, Decreased range of motion, Difficulty walking, Increased muscle spasms  Visit Diagnosis: Muscle weakness (generalized)  Difficulty in walking, not elsewhere classified  Pain in both knees, unspecified chronicity       G-Codes - 2016/07/17 1053    Functional Assessment Tool Used (Outpatient Only) MODI, pain scale, ROM, strength deficits, clinical judgement   Functional Limitation Mobility: Walking and moving around   Mobility: Walking and Moving Around Goal Status [phone]) At least 1 percent but less than 20 percent impaired, limited or restricted   Mobility: Walking and Moving Around Discharge Status [phone]) At least 20 percent but less than 40 percent impaired, limited or restricted      Problem List Patient Active Problem List    Diagnosis Date Noted  . Peripheral polyneuropathy ([place]) 05/10/2016  . Body mass index (bmi) 33.0-33.9, adult 04/28/2016  . Cellulitis 11/27/2015  . Pedal edema 11/27/2015  . Adjustment disorder with depressed mood   . Osteoarthritis of spine with radiculopathy, lumbar region 10/15/2015  . Abnormal MRI   . Slurred speech   . Surgery, elective   . Loose stools   . OSA (obstructive sleep apnea)   . Acute blood loss anemia   . Pneumonia   . Urinary retention   . Lewy body dementia   . Encephalopathy   . Altered mental status   . Acute encephalopathy 10/12/2015  . Hypokalemia 10/12/2015  . Anemia 10/12/2015  . S/P lumbar fusion 10/12/2015  . Acute respiratory failure ([place])   . Degenerative disc disease, lumbar 10/01/2015  . Sciatica of right side associated with disorder of lumbosacral spine 07/21/2015  . Hypertension 02/18/2015  . Diabetes ([place]) 02/18/2015  . Gout 02/18/2015  . Hyperlipidemia 02/18/2015  . Arthritis 02/18/2015  . Encounter to establish care 02/18/2015    Jomarie Longs PT 06/24/2016, 3:18 PM  [facility] [address], [place], [address] Phone: [phone]   Fax:  [phone]  Name: Danny Patterson MRN: [id number] Date of Birth: 1945/07/14

## 2016-06-25 ENCOUNTER — Ambulatory Visit: Payer: PPO | Admitting: Physical Therapy

## 2016-06-28 ENCOUNTER — Ambulatory Visit: Payer: PPO | Admitting: Physical Therapy

## 2016-07-06 ENCOUNTER — Encounter: Payer: PPO | Admitting: Physical Therapy

## 2016-07-12 ENCOUNTER — Encounter: Payer: PPO | Admitting: Physical Therapy

## 2016-07-13 ENCOUNTER — Other Ambulatory Visit: Payer: Self-pay | Admitting: *Deleted

## 2016-07-13 DIAGNOSIS — I10 Essential (primary) hypertension: Secondary | ICD-10-CM

## 2016-07-13 NOTE — Patient Outreach (Signed)
[facility]) Care Management  07/13/2016  TAVITA EASTHAM 07-11-1945 [id number]   RN Health Coach screening telephone call to patient.  Hipaa compliance verified. Patient wife Elease Hashimoto  Was the one that filled out the engagement questionnaire. Patient was not available. Per wife the patient has hypertension and takes medication. Patient has a history of diabetes but due to the wt loss his A1C has come down and he is off medications. Patient has dx of Sleep Apnea. Per wife no problems and not on machine. Patient is independent and drives self. Per wife the patient is very hard of hearing and needs hearing aids. He has difficulty hearing over the phone and the person calling may need to speak with her. Per wife since they are both retired they are unable to afford hearing aids for him. Per wife they would like some educational material on hypertension and see if assistance for hearing aid test and hearing aids.  Plan referral to pharmacy for medication reconciliation for over 10 medications Referral to case manger for education on hypertension Referral to social worker for assistance for hearing test and hearing aids.  Gean Maidens BSN RN [facility] Care Management [phone]

## 2016-07-15 ENCOUNTER — Other Ambulatory Visit: Payer: Self-pay | Admitting: *Deleted

## 2016-07-15 NOTE — Patient Outreach (Addendum)
Phone call completed, follow up on referral from [facility] RN Health Coach Scarlette Calico 4/24 for community nurse case management services- per [facility] health coach's note, spouse would like some educational material on Hypertension.  Person answering the phone identified herself as pt's spouse Danny Patterson, discussed purpose of call to follow up on referral to which spouse reports pt does not need  Help from [facility], give it to someone who needs it more, pt is out and about, refused to provide pt  identifiers.   With spouse refusing community nurse case management services for pt, case not opened.     Plan:  To follow up with Scarlette Calico [facility] RN Health Coach- inform pt  refusing  [facility] community nurse case management services.               Followed  up with [facility] social worker- also referred, informed of spouse's refusal.   Shayne Alken.   Gudrun Axe RN CCM [facility] Care Management  [phone]

## 2016-07-15 NOTE — Patient Outreach (Signed)
[facility]) Care Management  07/15/2016  Danny Patterson [birth year] [id number]   Phone call to patient to provide community resources for a hearing aid and hearing test per [facility]  HIPPA compliant voicemail message left requesting a return call.   Adriana Reams [facility] Care Management [phone]

## 2016-07-16 ENCOUNTER — Other Ambulatory Visit: Payer: Self-pay | Admitting: *Deleted

## 2016-07-16 NOTE — Patient Outreach (Signed)
[facility]) Care Management  07/16/2016  Danny Patterson [birth year] [id number]   Phone call to patient at the request of the [facility] Health Coach to assist patient with community resources for hearing aids. Patient's line was busy x2.    Plan: This Child psychotherapist will continue contact attempts.    Adriana Reams [facility] Care Management [phone]

## 2016-07-19 ENCOUNTER — Other Ambulatory Visit: Payer: Self-pay

## 2016-07-19 ENCOUNTER — Other Ambulatory Visit: Payer: Self-pay | Admitting: *Deleted

## 2016-07-19 ENCOUNTER — Encounter: Payer: Self-pay | Admitting: *Deleted

## 2016-07-19 DIAGNOSIS — R600 Localized edema: Secondary | ICD-10-CM

## 2016-07-19 MED ORDER — FUROSEMIDE 20 MG PO TABS: ORAL_TABLET | ORAL | 6 refills | Status: DC

## 2016-07-19 NOTE — Patient Outreach (Signed)
[facility]) Care Management  07/19/2016  Danny Patterson 08-25-1945 [id number]   Phone call to patient at the request of [facility] Health Coach to assist patient with community resources for a hearing aid.  Patient informed of the program through the Department of Social Services, Division for the Deaf and Hard of hearing, however states that he was very doubtful that he would follow through with the eligibility process. Patient declined denied need for additional nformation related to resources for a hearing aid at this time.  Patient refused [facility] care management services at this time.  Patient will not be open for [facility] care management at this time.     Adriana Reams [facility] Care Management [phone]

## 2016-07-19 NOTE — Telephone Encounter (Signed)
Last ov 05/10/16 Last filled 11/27/15

## 2016-07-21 ENCOUNTER — Ambulatory Visit: Payer: PPO | Admitting: Physical Therapy

## 2016-07-23 ENCOUNTER — Encounter: Payer: PPO | Admitting: Physical Therapy

## 2016-07-23 ENCOUNTER — Other Ambulatory Visit: Payer: Self-pay | Admitting: Pharmacist

## 2016-07-23 NOTE — Patient Outreach (Signed)
[facility] Care Management  07/23/2016  Danny StacksGary L Patterson 1945-05-21 [id number]   71 y.o. year old male [facility] Medication Management (Case Closure)  Patient was referred due to polypharmacy but patient has [facility].    Plan: Will not open case at this time.  Please re-consult if needed.   Hazle NordmannKelsy Combs, PharmD, BCPS [facility] PGY2 Pharmacy Resident [phone]

## 2016-08-09 ENCOUNTER — Ambulatory Visit (INDEPENDENT_AMBULATORY_CARE_PROVIDER_SITE_OTHER): Payer: PPO | Admitting: Diagnostic Neuroimaging

## 2016-08-09 ENCOUNTER — Encounter: Payer: Self-pay | Admitting: Diagnostic Neuroimaging

## 2016-08-09 VITALS — BP 145/87 | HR 75 | Wt 263.4 lb

## 2016-08-09 DIAGNOSIS — M5416 Radiculopathy, lumbar region: Secondary | ICD-10-CM | POA: Diagnosis not present

## 2016-08-09 DIAGNOSIS — G6281 Critical illness polyneuropathy: Secondary | ICD-10-CM

## 2016-08-09 DIAGNOSIS — G629 Polyneuropathy, unspecified: Secondary | ICD-10-CM | POA: Diagnosis not present

## 2016-08-09 NOTE — Progress Notes (Signed)
[facility]  PATIENT: Danny Patterson DOB: Dec 24, 1945  REFERRING CLINICIAN: Franky Macho, K HISTORY FROM: patient and wife and daughter  REASON FOR VISIT: follow up   HISTORICAL  CHIEF COMPLAINT:  Chief Complaint  Patient presents with  . Neuropathy    rm 7, wife- Patty, dgtr- Angie, "neuropathy better but still there; completed 69 PT sessions but my back is weak"  . Follow-up    3 month    HISTORY OF PRESENT ILLNESS:   UPDATE 08/10/16: Since last visit patient continues to have problems with balance and walking. Continues to have numbness and tingling in his toes and feet. He tends to have balance and walking problems. He completed physical therapy sessions. Patient declines to use cane or walker.  UPDATE 05/04/16: 71 year old male here for evaluation of lower extremity numbness and tingling. Patient has complex history including lumbar spine surgery in February 2000 by Dr. Jeral Fruit. Patient does not recall details but reviewing records patient was having low back pain and bilateral lower extremity leg pain. Apparently symptoms improved after surgery although he had an MRI scan of October 2000 which showed recurrence of symptoms. At some point over the next few years his symptoms had resolved and patient was able to be active with work and his day-to-day life. Patient has history of diabetes which has been under better control lately. In fact with weight loss and diet he has been able to come off of his diabetes medications. Hemoglobin A1c in August 2017 was 5.3. In July 2017 patient was having increasing balance and gait difficulty. He was starting to develop a "right foot drop" and had a second lumbar spine surgery on 10/01/15. Postoperatively he developed respiratory failure, progressive altered mental status and hypotension, requiring intubation. He then developed septic shock related to pneumonia and demand ischemia. Ultimately he was able to be extubated but continued to  have agitation confusion. Neurology was consulted in the hospital and MRI of the brain showed a small left frontoparietal focus of restricted diffusion, possibly an acute to subacute ischemic infarction. Neurology consulted also raise possibility of underlying neurodegenerative process such as dementia with Lewy bodies based on family report of preoperative memory loss, shuffling gait, hallucinations. Following patient's transition to rehabilitation and then home, patient noticed that he was having more numbness and tingling in his feet. He feels like he is wearing several pairs of socks. He is having more problems with balance and walking. He reports that some of these symptoms have progressively worsened after his lumbar spine surgery in July 2017. Based on progression of symptoms after surgery patient followed up with neurosurgery clinic, had follow-up testing including EMG nerve conduction study, CT myelogram, and was diagnosed with both generalized peripheral neuropathy as well as unremarkable CT myelogram findings. Therefore patient referred to our office for evaluation of neuropathy and symptoms.    REVIEW OF SYSTEMS: Full 14 system review of systems performed and negative with exception of: Swelling in legs hearing loss joint pain aching muscles numbness weakness.  ALLERGIES: Allergies  Allergen Reactions  . Ace Inhibitors Other (See Comments)    Angioedema.    HOME MEDICATIONS: Outpatient Medications Prior to Visit  Medication Sig Dispense Refill  . acetaminophen (TYLENOL) 325 MG tablet Take 2 tablets (650 mg total) by mouth 4 (four) times daily -  with meals and at bedtime.    Marland Kitchen amLODipine (NORVASC) 5 MG tablet Take 1 tablet (5 mg total) by mouth daily. Take 1 tablet daily 30 tablet 6  .  atorvastatin (LIPITOR) 20 MG tablet Take 1 tablet (20 mg total) by mouth daily at 6 PM. 30 tablet 12  . furosemide (LASIX) 20 MG tablet Take 2 tablets each AM for swelling 60 tablet 6  . gabapentin  (NEURONTIN) 300 MG capsule Take 1 capsule (300 mg total) by mouth 3 (three) times daily. 90 capsule 6  . KLOR-CON M20 20 MEQ tablet TAKE 1 TABLET (20 MEQ TOTAL) BY MOUTH DAILY. 30 tablet 6  . magnesium gluconate (MAGONATE) 500 MG tablet Take 500 mg by mouth daily.    . methocarbamol (ROBAXIN) 500 MG tablet TAKE 1 TABLET (500 MG TOTAL) BY MOUTH 3 (THREE) TIMES DAILY. FOR MUSCLE ACHES/SPASMS 30 tablet 2  . naproxen sodium (ANAPROX) 220 MG tablet Take 220 mg by mouth 2 (two) times daily with a meal.    . tamsulosin (FLOMAX) 0.4 MG CAPS capsule TAKE 1 CAPSULE (0.4 MG TOTAL) BY MOUTH DAILY. 30 capsule 6  . traMADol (ULTRAM) 50 MG tablet Take 50 mg by mouth every 6 (six) hours as needed.     No facility-administered medications prior to visit.     PAST MEDICAL HISTORY: Past Medical History:  Diagnosis Date  . Allergy   . Arthritis   . Diabetes mellitus without complication (HCC)   . Gout   . Hyperlipidemia   . Hypertension   . Kidney stone   . Rheumatic fever     PAST SURGICAL HISTORY: Past Surgical History:  Procedure Laterality Date  . BACK SURGERY    . knot     removed from neck  . POSTERIOR LUMBAR FUSION 4 LEVEL N/A 10/01/2015   Procedure: Lumbar three-four,  Lumbar four-five,  Lumbar five-Sacrum one posterior lumbar interbody fusion with Laminotomy at Lumbar Two-Three;  Surgeon: Hilda Lias, MD;  Location: [facility];  Service: Neurosurgery;  Laterality: N/A;    FAMILY HISTORY: Family History  Problem Relation Age of Onset  . Parkinson's disease Father 76  . Kidney disease Maternal Grandmother   . Hypertension Sister     SOCIAL HISTORY:  Social History   Social History  . Marital status: Married    Spouse name: Patty  . Number of children: 3  . Years of education: 14   Occupational History  .      self employed   Social History Main Topics  . Smoking status: Former Smoker    Types: Cigarettes  . Smokeless tobacco: Never Used  . Alcohol use 1.8 oz/week     3 Cans of beer per week     Comment: 05/04/16 3 beers daily  . Drug use: No  . Sexual activity: Not on file   Other Topics Concern  . Not on file   Social History Narrative   Lives with wife   Caffeine- coffee, 2 cups daily     PHYSICAL EXAM  GENERAL EXAM/CONSTITUTIONAL: Vitals:  Vitals:   08/09/16 1129  BP: (!) 145/87  Pulse: 75  Weight: 263 lb 6.4 oz (119.5 kg)   Body mass index is 37.79 kg/m. No exam data present  Patient is in no distress; well developed, nourished and groomed; neck is supple  CARDIOVASCULAR:  Examination of carotid arteries is normal; no carotid bruits  Regular rate and rhythm, no murmurs  Examination of peripheral vascular system by observation and palpation is normal  EYES:  Ophthalmoscopic exam of optic discs and posterior segments is normal; no papilledema or hemorrhages  MUSCULOSKELETAL:  Gait, strength, tone, movements noted in Neurologic exam below  NEUROLOGIC: MENTAL STATUS:  No flowsheet data found.  awake, alert, oriented to person, place and time  recent and remote memory intact  normal attention and concentration  language fluent, comprehension intact, naming intact,   fund of knowledge appropriate  CRANIAL NERVE:   2nd - no papilledema on fundoscopic exam  2nd, 3rd, 4th, 6th - pupils equal and reactive to light, visual fields full to confrontation, extraocular muscles intact, no nystagmus  5th - facial sensation symmetric  7th - facial strength symmetric  8th - hearing intact  9th - palate elevates symmetrically, uvula midline  11th - shoulder shrug symmetric  12th - tongue protrusion midline  MOTOR:   normal bulk and tone, full strength in the BUE, BLE  EXCEPT BILATERAL FOOT DORSIFLEXION WEAKNESS (RIGHT 3+, LEFT 4) AND BILATERAL EHL 2-3  SENSORY:   normal and symmetric to light touch, temperature, vibration  ABSENT PP IN FEET AND ANKLES; DECR PP IN FINGERS  ABSENT VIB IN TOES AND  ANKLES  COORDINATION:   finger-nose-finger, fine finger movements normal  FINGER TAPPING   REFLEXES:   deep tendon reflexes TRACE and symmetric  ABSENT REFLEXES AT ANKLES  GAIT/STATION:   STOOPED POSTURE; SMALL SHORT STEPS; ANTALGIC GAIT; UNSTEADY    DIAGNOSTIC DATA (LABS, IMAGING, TESTING) - I reviewed patient records, labs, notes, testing and imaging myself where available.  Lab Results  Component Value Date   WBC 7.3 11/06/2015   HGB 11.1 (L) 11/06/2015   HCT 34.3 (L) 11/06/2015   MCV 89.3 11/06/2015   PLT 189 11/06/2015      Component Value Date/Time   NA 139 11/06/2015 1548   NA 141 02/26/2015 1404   K 4.8 11/06/2015 1548   CL 103 11/06/2015 1548   CO2 26 11/06/2015 1548   GLUCOSE 80 11/06/2015 1548   BUN 20 11/06/2015 1548   BUN 22 02/26/2015 1404   CREATININE 1.08 11/06/2015 1548   CALCIUM 9.4 11/06/2015 1548   PROT 6.9 05/04/2016 1404   ALBUMIN 3.7 11/06/2015 1548   ALBUMIN 4.4 02/26/2015 1404   AST 19 11/06/2015 1548   ALT 12 11/06/2015 1548   ALKPHOS 98 11/06/2015 1548   BILITOT 0.4 11/06/2015 1548   BILITOT 0.5 02/26/2015 1404   GFRNONAA 69 11/06/2015 1548   GFRAA 80 11/06/2015 1548   Lab Results  Component Value Date   CHOL 127 02/26/2015   HDL 35 (L) 02/26/2015   LDLCALC 62 02/26/2015   TRIG 150 (H) 02/26/2015   CHOLHDL 3.6 02/26/2015   Lab Results  Component Value Date   HGBA1C 5.3 11/06/2015   Lab Results  Component Value Date   VITAMINB12 311 05/04/2016   Lab Results  Component Value Date   TSH 2.550 05/04/2016    04/19/16 EMG/NCS [I reviewed report and data myself and agree with interpretation. 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 In addition there are severe anterior osteophytes and bone bridging at T11, T12, L1 and L3, L4, L5. -VRP]  1. Interval L3-S1 fusion. Loosening of both L3 pedicle screws. No solid interbody osseous fusion at L3-4 or L5-S1. 2. Mild lateral recess stenosis at L2-3, improved from prior. Moderate left foraminal stenosis. 3. Improved lateral recess patency at L4-5 and L5-S1. 4. Moderate biforaminal stenosis at L3-4, L4-5, and L5-S1.  5. 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 6. Aortic atherosclerosis.     ASSESSMENT AND PLAN  71 y.o. year old male here with significant lumbar degenerative spine disease status post surgery 2, with recent surgery in July 2017 complicated by postoperative sepsis and critical illness, with postoperative numbness, tingling, neuropathy symptoms confirmed on electrodiagnostic testing. Patient also has history of diabetes which is well controlled now. I suspect patient developed critical illness neuropathy during his hospitalization following surgery. We'll check neuropathy labs to look for alternate causes of polyneuropathy. I agree that no lumbar spine surgical treatment options are likely to help improve patient's symptoms.  Ddx: peripheral neuropathy (likely critical illness neuropathy + diabetes or other cause) + lumbar radiculopathy / spinal stenosis + ? neurodegenerative dz (? Dementia with lewy bodies)  1. Critical illness neuropathy (HCC)   2. Neuropathy   3. Lumbar radiculitis      PLAN: I spent 25 minutes of face to face time with patient. Greater than 50% of time was spent in counseling and coordination of care with patient. In summary we discussed:  - continue physical therapy - supportive care - use cane or walker  Return if symptoms worsen or fail to improve, for return to PCP.     Suanne MarkerVIKRAM R. PENUMALLI, MD 08/09/2016, 12:18 PM Certified in Neurology, Neurophysiology and Neuroimaging  [facility] [address], [address] [place], [name] [address] [phone]

## 2016-08-10 ENCOUNTER — Encounter: Payer: Self-pay | Admitting: Family Medicine

## 2016-08-10 ENCOUNTER — Ambulatory Visit (INDEPENDENT_AMBULATORY_CARE_PROVIDER_SITE_OTHER): Payer: PPO | Admitting: Family Medicine

## 2016-08-10 VITALS — BP 132/80 | HR 67 | Temp 98.0°F | Resp 16 | Ht 70.0 in | Wt 260.0 lb

## 2016-08-10 DIAGNOSIS — M539 Dorsopathy, unspecified: Secondary | ICD-10-CM

## 2016-08-10 DIAGNOSIS — M5387 Other specified dorsopathies, lumbosacral region: Secondary | ICD-10-CM

## 2016-08-10 DIAGNOSIS — E669 Obesity, unspecified: Secondary | ICD-10-CM | POA: Diagnosis not present

## 2016-08-10 DIAGNOSIS — I10 Essential (primary) hypertension: Secondary | ICD-10-CM | POA: Diagnosis not present

## 2016-08-10 DIAGNOSIS — R600 Localized edema: Secondary | ICD-10-CM | POA: Diagnosis not present

## 2016-08-10 DIAGNOSIS — M5136 Other intervertebral disc degeneration, lumbar region: Secondary | ICD-10-CM | POA: Diagnosis not present

## 2016-08-10 DIAGNOSIS — G629 Polyneuropathy, unspecified: Secondary | ICD-10-CM

## 2016-08-10 DIAGNOSIS — M4726 Other spondylosis with radiculopathy, lumbar region: Secondary | ICD-10-CM | POA: Diagnosis not present

## 2016-08-10 DIAGNOSIS — M51369 Other intervertebral disc degeneration, lumbar region without mention of lumbar back pain or lower extremity pain: Secondary | ICD-10-CM

## 2016-08-10 MED ORDER — METHOCARBAMOL 500 MG PO TABS: 500.0000 mg | ORAL_TABLET | Freq: Three times a day (TID) | ORAL | 2 refills | Status: DC

## 2016-08-10 MED ORDER — AMLODIPINE BESYLATE 5 MG PO TABS: 5.0000 mg | ORAL_TABLET | Freq: Every day | ORAL | 3 refills | Status: DC

## 2016-08-10 NOTE — Patient Instructions (Signed)
Thank you for coming to the clinic today.   1. Try decreasing Gabapentin from 300mg  3 times daily down to 1 pill twice a day for 3-5 days, then if tolerated okay can decrease to 1 pill daily for 3-5 days or longer, and then may discontinue  Possibly if you still need to take one a day, make sure it is the bedtime or night time dose as this may affect swelling less. And may still help your symptoms.  Recommend to start taking Tylenol Extra Strength 500mg  tabs - take 1 to 2 tabs per dose (max 1000mg ) every 6-8 hours for pain (take regularly, don't skip a dose for next 7 days), max 24 hour daily dose is 6 tablets or 3000mg . In the future you can repeat the same everyday Tylenol course for 1-2 weeks at a time.   Take Robaxin muscle relaxant as needed  You will be due for FASTING BLOOD WORK (no food or drink after midnight before, only water or coffee without cream/sugar on the morning of)  - Please go ahead and schedule a "Lab Only" visit in the morning at the clinic for lab draw in 3 months, before next Annual Physical - Make sure Lab Only appointment is at least 1-2 weeks before your next appointment, so that results will be available  For Lab Results, once available within 2-3 days of blood draw, you can can log in to MyChart online to view your results and a brief explanation. Also, we can discuss results at next follow-up visit.  Please schedule a follow-up appointment with Dr. Althea CharonKaramalegos in 3 months Annual Physical  If you have any other questions or concerns, please feel free to call the clinic or send a message through MyChart. You may also schedule an earlier appointment if necessary.  Saralyn PilarAlexander Karamalegos, DO [facility]

## 2016-08-10 NOTE — Progress Notes (Signed)
Subjective:    Patient ID: Danny Patterson, male    DOB: 05/05/1945, 71 y.o.   MRN: [id number]  Danny Patterson is a 71 y.o. male presenting on 08/10/2016 for Hypertension   HPI   CHRONIC HTN with chronic Lower Extremity Edema Reports checks BP occasionally at home, SBP 130-140, earlier today when checked Current Meds - Amlodipine 5mg , Lasix 20mg  x 2 tabs daily in AM Reports good compliance, took meds today. Tolerating well, w/o complaints. Lifestyle: - No significant dietary changes, admits eats most things, not following low sodium diet - Limited regular exercise and activity due to gait and chronic back pain - Chronic bilateral ankle swelling, had this chronic problem for several years before started gabapentin, does do some elevation and takes Lasix 20mg  x 2 pills daily Denies CP, dyspnea, HA, dizziness / lightheadedness  Chronic LBP and Neuropathy - Reviewed complex prior history dating back to 2000 with lumbar spinal surgery by Dr Jeral Fruit with improvement for many years, until later 2017 developed R foot drop and had repeat 2nd lumbar spine surgery 09/2015 due to balance and gait problems, ultimately post-op developed acute critical illness (respiratory failure, septic shock, required intubation and ICU stay for up to 3 weeks), thought to have progressive worsening balance, and critical illness generalized neuropathy - No longer followed by Neurosurgery, Dr Jeral Fruit has retired, and he has not been back to see other partner - Followed by Neurology [facility] (Dr Marjory Lies), last visit 08/09/16, limited options unlikely to improve from neurosurgery, work-up checked for other causes of polyneuropathy, exam suggestive of reduced pain sensation in feet, ankles, and fingers, also reduced vibratory sensation in toes and ankles. He has had difficulty with gait, he attributes this to foot swelling - Had been followed by [facility], last in 06/2016 - Today endorses pain is more L knee sharp stabbing  pain occasionally, not significant pain, paresthesia, burning in feet or legs - Taking Gabapentin 300mg , takes 3 times daily, has been on this since 03/2015, interested to stop this med unsure if helping - Additionally states he lost significant amount of weight following back surgery then gained it back, see below - Denies any fevers/chills, numbness, tingling, weakness, loss of control bladder/bowel incontinence or retention, unintentional wt loss, night sweats  Obesity BMI >37 - Significant weight fluctuations, over past >1-2 year, he weighed approx 300 lbs in 2016, then he lost weight before most recent back surgery, and then lost significant amount of weight post-op in hospital in recovery over up to 2-3 weeks, down to 217 lbs in 10/2015, and then gradual steady weight gain back following >6 months with increased appetite and eating and difficulty with exercise - He states he is not ready in mindset to start to lose weight  Vitamin D Deficiency - Recently had low vitamin D in 04/2016, at 19, was started on vitamin D treatment by neurology, requesting re-check now in few months   Past Surgical History:  Procedure Laterality Date  . BACK SURGERY    . knot     removed from neck  . POSTERIOR LUMBAR FUSION 4 LEVEL N/A 10/01/2015   Procedure: Lumbar three-four,  Lumbar four-five,  Lumbar five-Sacrum one posterior lumbar interbody fusion with Laminotomy at Lumbar Two-Three;  Surgeon: Hilda Lias, MD;  Location: [facility];  Service: Neurosurgery;  Laterality: N/A;      Social History  Substance Use Topics  . Smoking status: Former Smoker    Types: Cigarettes  . Smokeless tobacco: Never  Used  . Alcohol use 1.8 oz/week    3 Cans of beer per week     Comment: 05/04/16 3 beers daily    Review of Systems Per HPI unless specifically indicated above     Objective:    BP 132/80 (BP Location: Left Arm, Cuff Size: Normal)   Pulse 67   Temp 98 F (36.7 C) (Oral)   Resp 16   Ht 5\' 10"   (1.778 m)   Wt 260 lb (117.9 kg)   BMI 37.31 kg/m   Wt Readings from Last 3 Encounters:  08/10/16 260 lb (117.9 kg)  08/09/16 263 lb 6.4 oz (119.5 kg)  05/10/16 242 lb (109.8 kg)    Physical Exam  Constitutional: He is oriented to person, place, and time. He appears well-developed and well-nourished. No distress.  Well-appearing, comfortable, cooperative  HENT:  Head: Normocephalic and atraumatic.  Mouth/Throat: Oropharynx is clear and moist.  Eyes: Conjunctivae are normal. Right eye exhibits no discharge. Left eye exhibits no discharge.  Neck: Normal range of motion. Neck supple. No thyromegaly present.  Cardiovascular: Normal rate, regular rhythm, normal heart sounds and intact distal pulses.   No murmur heard. Pulmonary/Chest: Effort normal and breath sounds normal. No respiratory distress. He has no wheezes. He has no rales.  Musculoskeletal: He exhibits edema (Bilateral lower leg, ankle, foot edema +1 pitting to non pitting in feet symmetrical).  Left Knee Inspection: Slightly bulky appearance but symmetrical. No ecchymosis or effusion. Palpation: Non-tender joint line. Minimal crepitus ROM: Full active ROM bilaterally Strength: 5/5 intact knee flex/ext, ankle dorsi/plantarflex Neurovascular: distally intact sensation light touch and pulses  Lymphadenopathy:    He has no cervical adenopathy.  Neurological: He is alert and oriented to person, place, and time.  Distal sensation to light touch intact  Skin: Skin is warm and dry. No rash noted. He is not diaphoretic. No erythema.  Psychiatric: He has a normal mood and affect. His behavior is normal.  Well groomed, good eye contact, normal speech and thoughts  Nursing note and vitals reviewed.  Results for orders placed or performed in visit on 05/04/16  Neuropathy Panel  Result Value Ref Range   Vitamin B-12 311 232 - 1,245 pg/mL   Total Protein 6.9 6.0 - 8.5 g/dL   Albumin ELP 4.0 2.9 - 4.4 g/dL   Alpha 1 0.2 0.0 - 0.4  g/dL   Alpha 2 0.8 0.4 - 1.0 g/dL   Beta 1.1 0.7 - 1.3 g/dL   Gamma Globulin 0.7 0.4 - 1.8 g/dL   M-Spike, % Not Observed Not Observed g/dL   GLOBULIN, TOTAL 2.9 2.2 - 3.9 g/dL   A/G Ratio 1.4 0.7 - 1.7   Please Note: Comment    TSH 2.550 0.450 - 4.500 uIU/mL   Vit D, 25-Hydroxy 19.3 (L) 30.0 - 100.0 ng/mL   Anit Nuclear Antibody(ANA) Negative Negative   Rhuematoid fact SerPl-aCnc <10.0 0.0 - 13.9 IU/mL   Angio Convert Enzyme 57 14 - 82 U/L   Sed Rate 4 0 - 30 mm/hr      Assessment & Plan:   Problem List Items Addressed This Visit    Pedal edema    Significant bilateral pedal and lower ankle edema, some improve with compression elevation. Seems to be affecting gait per patient one of primary causes of unsteady gait and imbalance - Had completed PT outpatient [facility] - Reduce Gabapentin, may be causing edema as side effect, may take nightly instead - RICE therapy, future second opinion physical  therapy if needed for gait training      Osteoarthritis of spine with radiculopathy, lumbar region    Chronic LBP s/p multiple lumbar spinal surgeries, likely chronic nerve injury affecting radiculopathy and R foot drop - No longer followed by Neurosurgery since Dr Jeral FruitBotero retired, Neurology suggests no further improvement in neuropathy from other surgery - Follow-up as needed, see above A&P      Relevant Medications   methocarbamol (ROBAXIN) 500 MG tablet   Obesity (BMI 35.0-39.9 without comorbidity)    Fluctuating weight, now gradual trend up >6 months, attributed to inc appetite poor food choices, limited exercise - Today not ready to make lifestyle changes, refuses      Hypertension - Primary    Controlled HTN Without known complication Failed ACEi (allergy angioedema)  Plan: 1. Continue current regimen on Amlodipine 5mg  daily 2. Continue Lasix 40mg  daily in AM for now, concern that chronic use not ideal and can affect kidney function, consider switch to Thiazide in future for  diuretic 3. Improve hydration, advised need to start some regular exercise and improved low sodium diet 4. Monitor BP outside office 5. Follow-up 3 months for annual physical, labs      Relevant Medications   amLODipine (NORVASC) 5 MG tablet   Degenerative disc disease, lumbar   Relevant Medications   methocarbamol (ROBAXIN) 500 MG tablet   Critical illness neuropathy (HCC)    Chronic problem s/p 09/2015 after acute critical post-op illness with septic shock, acute resp failure, required prolonged ICU stay and intubation - Unclear if had some neuropathy prior to this with R foot drop - Followed by Neurology ([facility]), no other clear reversible cause identified - Uncertain if improvement on Gabapentin, patient wishes to taper off of this, advised on reducing over 1-2 weeks, maybe just needs nightly dose instead, consider this option - Continue Tylenol, Muscle relax, avoid NSAIDs Follow-up      Relevant Medications   methocarbamol (ROBAXIN) 500 MG tablet    Other Visit Diagnoses    Sciatica of right side associated with disorder of lumbosacral spine       Relevant Medications   methocarbamol (ROBAXIN) 500 MG tablet      Meds ordered this encounter  Medications  . amLODipine (NORVASC) 5 MG tablet    Sig: Take 1 tablet (5 mg total) by mouth daily. Take 1 tablet daily    Dispense:  90 tablet    Refill:  3  . methocarbamol (ROBAXIN) 500 MG tablet    Sig: Take 1 tablet (500 mg total) by mouth 3 (three) times daily. For muscle aches/spasms    Dispense:  30 tablet    Refill:  2      Follow up plan: Return in about 3 months (around 11/10/2016) for Annual Physical.  Saralyn PilarAlexander Teasha Murrillo, DO [facility] 08/11/2016, 6:09 AM

## 2016-08-11 ENCOUNTER — Other Ambulatory Visit: Payer: Self-pay | Admitting: Family Medicine

## 2016-08-11 DIAGNOSIS — G6281 Critical illness polyneuropathy: Secondary | ICD-10-CM

## 2016-08-11 DIAGNOSIS — Z0000 Encounter for general adult medical examination without abnormal findings: Secondary | ICD-10-CM

## 2016-08-11 DIAGNOSIS — E119 Type 2 diabetes mellitus without complications: Secondary | ICD-10-CM

## 2016-08-11 DIAGNOSIS — I10 Essential (primary) hypertension: Secondary | ICD-10-CM

## 2016-08-11 DIAGNOSIS — Z125 Encounter for screening for malignant neoplasm of prostate: Secondary | ICD-10-CM

## 2016-08-11 DIAGNOSIS — E559 Vitamin D deficiency, unspecified: Secondary | ICD-10-CM

## 2016-08-11 DIAGNOSIS — E782 Mixed hyperlipidemia: Secondary | ICD-10-CM

## 2016-08-11 NOTE — Assessment & Plan Note (Signed)
Chronic problem s/p 09/2015 after acute critical post-op illness with septic shock, acute resp failure, required prolonged ICU stay and intubation - Unclear if had some neuropathy prior to this with R foot drop - Followed by Neurology ([facility]), no other clear reversible cause identified - Uncertain if improvement on Gabapentin, patient wishes to taper off of this, advised on reducing over 1-2 weeks, maybe just needs nightly dose instead, consider this option - Continue Tylenol, Muscle relax, avoid NSAIDs Follow-up

## 2016-08-11 NOTE — Assessment & Plan Note (Signed)
Controlled HTN Without known complication Failed ACEi (allergy angioedema)  Plan: 1. Continue current regimen on Amlodipine 5mg  daily 2. Continue Lasix 40mg  daily in AM for now, concern that chronic use not ideal and can affect kidney function, consider switch to Thiazide in future for diuretic 3. Improve hydration, advised need to start some regular exercise and improved low sodium diet 4. Monitor BP outside office 5. Follow-up 3 months for annual physical, labs

## 2016-08-11 NOTE — Assessment & Plan Note (Signed)
Significant bilateral pedal and lower ankle edema, some improve with compression elevation. Seems to be affecting gait per patient one of primary causes of unsteady gait and imbalance - Had completed PT outpatient [facility] - Reduce Gabapentin, may be causing edema as side effect, may take nightly instead - RICE therapy, future second opinion physical therapy if needed for gait training

## 2016-08-11 NOTE — Assessment & Plan Note (Signed)
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

## 2016-08-11 NOTE — Assessment & Plan Note (Signed)
Fluctuating weight, now gradual trend up >6 months, attributed to inc appetite poor food choices, limited exercise - Today not ready to make lifestyle changes, refuses

## 2016-10-20 ENCOUNTER — Telehealth: Payer: Self-pay | Admitting: Family Medicine

## 2016-10-20 DIAGNOSIS — M5136 Other intervertebral disc degeneration, lumbar region: Secondary | ICD-10-CM

## 2016-10-20 DIAGNOSIS — M4726 Other spondylosis with radiculopathy, lumbar region: Secondary | ICD-10-CM

## 2016-10-20 MED ORDER — METHOCARBAMOL 500 MG PO TABS: 500.0000 mg | ORAL_TABLET | Freq: Three times a day (TID) | ORAL | 5 refills | Status: DC

## 2016-10-20 NOTE — Telephone Encounter (Signed)
Pt [facility] [address] [place]  His call back number is [phone]

## 2016-10-20 NOTE — Telephone Encounter (Signed)
Refilled methocarbamol  Danny PilarAlexander Kahliyah Dick, DO [facility] 10/20/2016, 10:22 PM

## 2016-11-08 DIAGNOSIS — E559 Vitamin D deficiency, unspecified: Secondary | ICD-10-CM | POA: Diagnosis not present

## 2016-11-08 DIAGNOSIS — Z0000 Encounter for general adult medical examination without abnormal findings: Secondary | ICD-10-CM | POA: Diagnosis not present

## 2016-11-08 DIAGNOSIS — Z125 Encounter for screening for malignant neoplasm of prostate: Secondary | ICD-10-CM | POA: Diagnosis not present

## 2016-11-08 DIAGNOSIS — G6281 Critical illness polyneuropathy: Secondary | ICD-10-CM | POA: Diagnosis not present

## 2016-11-08 DIAGNOSIS — E782 Mixed hyperlipidemia: Secondary | ICD-10-CM | POA: Diagnosis not present

## 2016-11-08 DIAGNOSIS — I10 Essential (primary) hypertension: Secondary | ICD-10-CM | POA: Diagnosis not present

## 2016-11-09 ENCOUNTER — Other Ambulatory Visit: Payer: PPO

## 2016-11-09 DIAGNOSIS — Z0000 Encounter for general adult medical examination without abnormal findings: Secondary | ICD-10-CM

## 2016-11-09 DIAGNOSIS — G6281 Critical illness polyneuropathy: Secondary | ICD-10-CM

## 2016-11-09 DIAGNOSIS — I10 Essential (primary) hypertension: Secondary | ICD-10-CM

## 2016-11-09 DIAGNOSIS — Z125 Encounter for screening for malignant neoplasm of prostate: Secondary | ICD-10-CM

## 2016-11-09 DIAGNOSIS — E559 Vitamin D deficiency, unspecified: Secondary | ICD-10-CM

## 2016-11-09 DIAGNOSIS — E782 Mixed hyperlipidemia: Secondary | ICD-10-CM

## 2016-11-09 LAB — CBC WITH DIFFERENTIAL/PLATELET
BASOS PCT: 1 %
Basophils Absolute: 67 cells/uL (ref 0–200)
EOS ABS: 335 {cells}/uL (ref 15–500)
EOS PCT: 5 %
HCT: 41 % (ref 38.5–50.0)
HEMOGLOBIN: 13.4 g/dL (ref 13.2–17.1)
LYMPHS ABS: 2613 {cells}/uL (ref 850–3900)
Lymphocytes Relative: 39 %
MCH: 30.9 pg (ref 27.0–33.0)
MCHC: 32.7 g/dL (ref 32.0–36.0)
MCV: 94.7 fL (ref 80.0–100.0)
MONOS PCT: 6 %
MPV: 9.1 fL (ref 7.5–12.5)
Monocytes Absolute: 402 cells/uL (ref 200–950)
NEUTROS ABS: 3283 {cells}/uL (ref 1500–7800)
Neutrophils Relative %: 49 %
Platelets: 189 10*3/uL (ref 140–400)
RBC: 4.33 MIL/uL (ref 4.20–5.80)
RDW: 14.8 % (ref 11.0–15.0)
WBC: 6.7 10*3/uL (ref 3.8–10.8)

## 2016-11-10 LAB — COMPLETE METABOLIC PANEL WITH GFR
ALK PHOS: 90 U/L (ref 40–115)
ALT: 21 U/L (ref 9–46)
AST: 31 U/L (ref 10–35)
Albumin: 4.2 g/dL (ref 3.6–5.1)
BILIRUBIN TOTAL: 0.5 mg/dL (ref 0.2–1.2)
BUN: 24 mg/dL (ref 7–25)
CO2: 23 mmol/L (ref 20–32)
Calcium: 9.1 mg/dL (ref 8.6–10.3)
Chloride: 104 mmol/L (ref 98–110)
Creat: 1.19 mg/dL — ABNORMAL HIGH (ref 0.70–1.18)
GFR, EST AFRICAN AMERICAN: 71 mL/min (ref >=60)
GFR, EST NON AFRICAN AMERICAN: 61 mL/min (ref >=60)
Glucose, Bld: 98 mg/dL (ref 65–99)
POTASSIUM: 4.2 mmol/L (ref 3.5–5.3)
Sodium: 142 mmol/L (ref 135–146)
TOTAL PROTEIN: 6.5 g/dL (ref 6.1–8.1)

## 2016-11-10 LAB — LIPID PANEL
Cholesterol: 144 mg/dL (ref <200)
HDL: 47 mg/dL (ref >40)
LDL CALC: 79 mg/dL (ref <100)
TRIGLYCERIDES: 89 mg/dL (ref <150)
Total CHOL/HDL Ratio: 3.1 Ratio (ref <5.0)
VLDL: 18 mg/dL (ref <30)

## 2016-11-10 LAB — PSA, TOTAL WITH REFLEX TO PSA, FREE: PSA, Total: 1 ng/mL (ref <=4.0)

## 2016-11-10 LAB — VITAMIN D 25 HYDROXY (VIT D DEFICIENCY, FRACTURES): Vit D, 25-Hydroxy: 24 ng/mL — ABNORMAL LOW (ref 30–100)

## 2016-11-10 LAB — HEMOGLOBIN A1C
Hgb A1c MFr Bld: 5.5 % (ref <5.7)
MEAN PLASMA GLUCOSE: 111 mg/dL

## 2016-11-12 ENCOUNTER — Encounter: Payer: Self-pay | Admitting: Family Medicine

## 2016-11-12 ENCOUNTER — Ambulatory Visit (INDEPENDENT_AMBULATORY_CARE_PROVIDER_SITE_OTHER): Payer: PPO | Admitting: Family Medicine

## 2016-11-12 VITALS — BP 138/72 | HR 80 | Temp 97.8°F | Resp 16 | Ht 70.0 in | Wt 261.0 lb

## 2016-11-12 DIAGNOSIS — E119 Type 2 diabetes mellitus without complications: Secondary | ICD-10-CM | POA: Diagnosis not present

## 2016-11-12 DIAGNOSIS — M4726 Other spondylosis with radiculopathy, lumbar region: Secondary | ICD-10-CM

## 2016-11-12 DIAGNOSIS — R600 Localized edema: Secondary | ICD-10-CM | POA: Diagnosis not present

## 2016-11-12 DIAGNOSIS — R339 Retention of urine, unspecified: Secondary | ICD-10-CM | POA: Diagnosis not present

## 2016-11-12 DIAGNOSIS — M150 Primary generalized (osteo)arthritis: Secondary | ICD-10-CM | POA: Diagnosis not present

## 2016-11-12 DIAGNOSIS — I10 Essential (primary) hypertension: Secondary | ICD-10-CM

## 2016-11-12 DIAGNOSIS — M159 Polyosteoarthritis, unspecified: Secondary | ICD-10-CM | POA: Insufficient documentation

## 2016-11-12 MED ORDER — DICLOFENAC SODIUM 1 % TD GEL: 2.0000 g | Freq: Three times a day (TID) | TRANSDERMAL | 5 refills | Status: DC

## 2016-11-12 MED ORDER — ACETAMINOPHEN 500 MG PO TABS: 1000.0000 mg | ORAL_TABLET | Freq: Three times a day (TID) | ORAL | 0 refills | Status: DC | PRN

## 2016-11-12 NOTE — Assessment & Plan Note (Addendum)
Seems resolved, still has rare recurrences, usually spontaneously resolves Was on Flomax, now off for >6 months PSA 1, in past was not told enlarged BPH If some component of retention, may contribute to mild CKD Follow-up prostate BPH if worse CKD, re-consider Flomax if need Return criteria given if acute retention

## 2016-11-12 NOTE — Assessment & Plan Note (Addendum)
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 CKD not significant to cause degree of edema.  Plan: 1. Discussed changes to regimen - concern amlodipine may be causing some of his lower ext edema, agree to trial HOLDING Amlodipine 5mg  for period of time, if improve edema, then STOP Amlodipine, notify office and we can switch to alternative med - possibly BB vs thiazide (especially if DC regular lasix) 2. Similarly - will do trial HOLDING Lasix 20mg  x 2 AM - to see if benefiting his edema, if not helping then will DC but may switch to PRN only, since he is at risk for worsening CKD 3. Improve hydration, advised need to start some regular exercise and improved low sodium diet 4. On lower Gabapentin already 5. RICE therapy 6. Future follow-up if not improved - consider referral to Vascular Surgery, may need venous reflux imaging

## 2016-11-12 NOTE — Patient Instructions (Addendum)
Thank you for coming to the clinic today.  1. Follow-up with your Neurosurgeon as planned, and discuss your options.  If you are not satisfied follow back up with me and we can discuss other referrals.  STOP taking Aleve for now. This can harm your kidneys if you take it too long. You may still use this once in a while if bad flare up.  Recommend to start taking Tylenol Extra Strength 500mg  tabs - take 1 to 2 tabs per dose (max 1000mg ) every 6-8 hours for pain,  max 24 hour daily dose is 6 tablets or 3000mg  - May take one dose EVERY day, and add 2nd and 3rd doses as needed or continue high dose every day  Start the new Diclofenac gel 2-3 times a day as needed for hands and knees arthritis. You may take Tylenol with this. - These are both safe for kidney  2. Do a trial on STOPPING Furosemide (#2) - 2-3 days, see if the swelling in legs is any different, if it is no worse, then can stay off this for longer. Only use when you need it. Otherwise if you need it you can keep taking it daily, and then you would need to go get the Klor Con (#3) potassium once daily  3. For Swelling  - May be side effect from Amlodipine #1 - HOLD this for 1 week to see if your swelling IMPROVES, if it does then it may be a side effect from this medication. We can switch it in the future if it is causing your swelling.   Please schedule a Follow-up Appointment to: Return in about 3 months (around 02/12/2017) for Swelling / Arthritis / Back(discuss referral).  If you have any other questions or concerns, please feel free to call the clinic or send a message through MyChart. You may also schedule an earlier appointment if necessary.  Additionally, you may be receiving a survey about your experience at our clinic within a few days to 1 week by e-mail or mail. We value your feedback.  [name], DO [facility], [place]

## 2016-11-12 NOTE — Assessment & Plan Note (Signed)
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 Not on therapy 2. Encourage improved lifestyle - low carb, low sugar diet, reduce portion size, start regular exercise 3. Continue Statin. - future consider ASA, possible ARB but caution with ACEi angioedema 4. DM Foot exam done today / Advised to schedule DM ophtho exam, send record 5. Follow-up 3-6 month A1c

## 2016-11-12 NOTE — Assessment & Plan Note (Addendum)
Controlled HTN Without known complication Failed ACEi (allergy angioedema)  Plan: 1. Discussed changes to regimen - concern amlodipine may be causing some of his lower ext edema, agree to trial HOLDING Amlodipine 5mg  for period of time, if improve edema, then STOP Amlodipine, notify office and we can switch to alternative med - possibly BB vs thiazide (especially if DC regular lasix) 2. Similarly - will do trial HOLDING Lasix 20mg  x 2 AM - to see if benefiting his edema, if not helping then will DC but may switch to PRN only, since he is at risk for worsening CKD 3. Improve hydration, advised need to start some regular exercise and improved low sodium diet 4. Monitor BP outside office 5. Follow-up 3 months - he is to notify office sooner with any potential med changes as per above

## 2016-11-12 NOTE — Assessment & Plan Note (Addendum)
See A&P for OA  Regarding back, recommend that he discuss his neurosurgery post-op concerns back with same office new neurosurgeon after Dr Jeral Fruit has now retired. He will get second opinion, and then if not satisfied will follow-up back up with me to locate next option, may consider PM&R for future approach, if he is not interested in another surgery.   I advised him that since he does not have much back pain but has more limited function this is likely due to lumbar fusion and more surgery is another risk and may not benefit him.

## 2016-11-12 NOTE — Assessment & Plan Note (Signed)
Chronic stable to gradual worsening multiple joint osteoarthritis, worse knees, hands, known significant spinal DJD - Regarding knees, no instability or locking, unlikely meniscus based on exam - Hands do not have RA appearance - Back s/p multiple surgery - responds to NSAIDs  Plan: 1. Discussion on management and prognosis of arthritis 2. Re-start high dose Tylenol regular regimen, then PRN 3. New rx diclofenac topical 1% gel TID PRN - indicated for patient with CKD, trial on prior meloxicam, aleve, naproxen, ibuprofen, for knees and hands - however if not approved or not helping can consider repeat trial back on Meloxicam 4. Continue Robaxin PRN - offer to switch to other muscle relaxant, mostly for knees and back 5.RICE therapy (rest, ice, knee compression sleeves, elevation) for swelling, activity modification 6. Consider update X-rays 7. Follow-up 3 months - may need knee steroid injection vs referral to PT vs Ortho

## 2016-11-12 NOTE — Progress Notes (Addendum)
Subjective:    Patient ID: Danny Patterson, male    DOB: 10-Oct-1945, 71 y.o.   MRN: [id number]  Danny Patterson is a 71 y.o. male presenting on 11/12/2016 for Knee Pain and Osteoarthritis   HPI   Here for Annual Exam, Lab Review.  CHRONIC HTN Reports checks BP occasionally at home Current Meds - Amlodipine '5mg'$  (unsure if this caused any swelling), Lasix '20mg'$  x 2 tabs daily in AM Reports good compliance, took meds today. Tolerating well, w/o complaints. Lifestyle: - No significant dietary changes, admits eats most things, not following low sodium diet - Limited regular exercise and activity due to gait and chronic back pain  Bilateral Lower Extremity Edema: - Reports chronic problem, unable to provide timeline for swelling, seems significantly worse following critical post-op illness in 09/2015. - He wears compression stockings with good results but still has significant swelling - Tries elevation not regularly - He thinks gabapentin higher doses made swelling worse - Taking Lasix '20mg'$  x 2 tabs daily in AM, voiding well. Was taking K 40mq but no longer for several months, unsure why he ran out, K was normal on recent labs  CKD-II-III /  H/o Urinary Retention - Reports was told he had problems with kidneys before many years ago, found nephrolithiasis. Additionally he has chronic history of NSAID use, [place] with good results, was unaware it may harm his kidneys. Also he has been still taking Aleve '220mg'$  x 2 tabs daily in AM for months to years, decent results - Also has history of Urinary Retention with normal PSA In past, occasionally he "can't go" but usually resolves, was previously on Flomax for this but then stopped it and has not needed it since  Osteoarthritis Multiple Joints / Histry of Lumbar DJD and Spine Surgery / Neuropathy - Last visit with me 08/10/16, for initial visit for same problem, see prior notes for background  information. - Interval update with now he has [facility] office that Dr BJoya Salmretired from, he is looking for second opinion - Today patient reports significant frustrations from his problematic back surgery, he does not endorse significant pain but complains still of difficulty with excessive muscle and back stiffness, as with other joints in shoulders and knees, limits his function and ambulation - He has completed extensive outpatient PT - Medications: failed high dose gabapentin. Ineffective with some muscle relaxants, now on some relief Robaxin not taking regularly. He never started Tylenol as advised. Was taking regular Aleve. - Denies any fevers/chills, numbness, tingling, weakness, loss of control bladder/bowel incontinence or retention, unintentional wt loss, night sweats  Health Maintenance: - Prostate CA Screening: PSA 1.0 (10/2016) - Declined routine Hep C screening  Past Surgical History:  Procedure Laterality Date  . BACK SURGERY    . knot     removed from neck  . POSTERIOR LUMBAR FUSION 4 LEVEL N/A 10/01/2015   Procedure: Lumbar three-four,  Lumbar four-five,  Lumbar five-Sacrum one posterior lumbar interbody fusion with Laminotomy at Lumbar Two-Three;  Surgeon: ELeeroy Cha MD;  [facility];  Service: Neurosurgery;  Laterality: N/A;    Social History  Substance Use Topics  . Smoking status: Former Smoker    Types: Cigarettes  . Smokeless tobacco: Never Used  . Alcohol use 1.8 oz/week    3 Cans of beer per week     Comment: 05/04/16 3 beers daily  Review of Systems Per HPI unless specifically indicated above     Objective:    BP 138/72   Pulse 80   Temp 97.8 F (36.6 C) (Oral)   Resp 16   Ht 5\' 10"  (1.778 m)   Wt 261 lb (118.4 kg)   BMI 37.45 kg/m   Wt Readings from Last 3 Encounters:  11/12/16 261 lb (118.4 kg)  08/10/16 260 lb (117.9 kg)  08/09/16 263 lb 6.4 oz (119.5 kg)    Physical Exam    Constitutional: He is oriented to person, place, and time. He appears well-developed and well-nourished. No distress.  Well-appearing, comfortable, cooperative  HENT:  Head: Normocephalic and atraumatic.  Mouth/Throat: Oropharynx is clear and moist.  Eyes: Conjunctivae are normal. Right eye exhibits no discharge. Left eye exhibits no discharge.  Cardiovascular: Normal rate, regular rhythm, normal heart sounds and intact distal pulses.   No murmur heard. Pulmonary/Chest: Effort normal and breath sounds normal. No respiratory distress. He has no wheezes. He has no rales.  Musculoskeletal: He exhibits edema (Unchanged, wearing compression stockings, Bilateral lower leg, ankle, foot edema +1 pitting to non pitting in feet symmetrical).  Bilateral Knees Inspection: Stable bulky appearance but symmetrical. No ecchymosis or effusion. Palpation: Non-tender joint lines. Fine crepitus R>L ROM: Slightly limited bilateral knee extension, but mostly preserved. He has reduced knee flexion L>R Special Testing: Lachman / Valgus/Varus tests negative with intact ligaments (ACL, MCL, LCL). McMurray negative without meniscus symptoms. Strength: 5/5 intact knee flex/ext, ankle dorsi/plantarflex Neurovascular: distally intact sensation light touch and pulses  Low Back Inspection: Normal appearance, Large body habitus, no spinal deformity, symmetrical. Midline vertical low lumbar incisional scar well healed. Resting inc thoracic kyphosis Palpation: No tenderness over spinous processes. Bilateral thoracic and lumbar paraspinal muscles non-tender but with hypertonicity/spasm. ROM: Limited back extension (thoracic) Special Testing: Seated SLR negative for radicular pain bilaterally  Strength: Bilateral hip flex/ext 5/5, knee flex/ext 5/5, ankle dorsiflex/plantarflex 5/5 Neurovascular: intact distal sensation to light touch  Neurological: He is alert and oriented to person, place, and time.  Distal sensation to  light touch intact  Skin: Skin is warm and dry. No rash noted. He is not diaphoretic. No erythema.  Psychiatric: He has a normal mood and affect. His behavior is normal.  Well groomed, good eye contact, normal speech and thoughts  Nursing note and vitals reviewed.  Diabetic Foot Exam - Simple   Simple Foot Form Diabetic Foot exam was performed with the following findings:  Yes 11/12/2016  3:45 PM  Visual Inspection No deformities, no ulcerations, no other skin breakdown bilaterally:  Yes Sensation Testing See comments:  Yes Pulse Check Posterior Tibialis and Dorsalis pulse intact bilaterally:  Yes Comments Bilateral Great toes distal plantar aspects, R>L reduced sensation to monofilament testing     Recent Labs  11/08/16 1209  HGBA1C 5.5    Results for orders placed or performed in visit on 11/09/16  Hemoglobin A1c  Result Value Ref Range   Hgb A1c MFr Bld 5.5 <5.7 %   Mean Plasma Glucose 111 mg/dL  COMPLETE METABOLIC PANEL WITH GFR  Result Value Ref Range   Sodium 142 135 - 146 mmol/L   Potassium 4.2 3.5 - 5.3 mmol/L   Chloride 104 98 - 110 mmol/L   CO2 23 20 - 32 mmol/L   Glucose, Bld 98 65 - 99 mg/dL   BUN 24 7 - 25 mg/dL   Creat 11/11/16 (H) 8.12 - 1.18 mg/dL   Total Bilirubin 0.5 0.2 -  1.2 mg/dL   Alkaline Phosphatase 90 40 - 115 U/L   AST 31 10 - 35 U/L   ALT 21 9 - 46 U/L   Total Protein 6.5 6.1 - 8.1 g/dL   Albumin 4.2 3.6 - 5.1 g/dL   Calcium 9.1 8.6 - 10.3 mg/dL   GFR, Est African American 71 >=60 mL/min   GFR, Est Non African American 61 >=60 mL/min  CBC with Differential/Platelet  Result Value Ref Range   WBC 6.7 3.8 - 10.8 K/uL   RBC 4.33 4.20 - 5.80 MIL/uL   Hemoglobin 13.4 13.2 - 17.1 g/dL   HCT 41.0 38.5 - 50.0 %   MCV 94.7 80.0 - 100.0 fL   MCH 30.9 27.0 - 33.0 pg   MCHC 32.7 32.0 - 36.0 g/dL   RDW 14.8 11.0 - 15.0 %   Platelets 189 140 - 400 K/uL   MPV 9.1 7.5 - 12.5 fL   Neutro Abs 3,283 1,500 - 7,800 cells/uL   Lymphs Abs 2,613 850 - 3,900  cells/uL   Monocytes Absolute 402 200 - 950 cells/uL   Eosinophils Absolute 335 15 - 500 cells/uL   Basophils Absolute 67 0 - 200 cells/uL   Neutrophils Relative % 49 %   Lymphocytes Relative 39 %   Monocytes Relative 6 %   Eosinophils Relative 5 %   Basophils Relative 1 %   Smear Review Criteria for review not met   Lipid panel  Result Value Ref Range   Cholesterol 144 <200 mg/dL   Triglycerides 89 <150 mg/dL   HDL 47 >40 mg/dL   Total CHOL/HDL Ratio 3.1 <5.0 Ratio   VLDL 18 <30 mg/dL   LDL Cholesterol 79 <100 mg/dL  VITAMIN D 25 Hydroxy (Vit-D Deficiency, Fractures)  Result Value Ref Range   Vit D, 25-Hydroxy 24 (L) 30 - 100 ng/mL  PSA, Total with Reflex to PSA, Free  Result Value Ref Range   PSA, Total 1.0 <=4.0 ng/mL      Assessment & Plan:   Problem List Items Addressed This Visit    Urinary retention    Seems resolved, still has rare recurrences, usually spontaneously resolves Was on Flomax, now off for >6 months PSA 1, in past was not told enlarged BPH If some component of retention, may contribute to mild CKD Follow-up prostate BPH if worse CKD, re-consider Flomax if need Return criteria given if acute retention      Osteoarthritis of spine with radiculopathy, lumbar region    See A&P for OA  Regarding back, recommend that he discuss his neurosurgery post-op concerns back with same office new neurosurgeon after Dr Joya Salm has now retired. He will get second opinion, and then if not satisfied will follow-up back up with me to locate next option, may consider PM&R for future approach, if he is not interested in another surgery.   I advised him that since he does not have much back pain but has more limited function this is likely due to lumbar fusion and more surgery is another risk and may not benefit him.      Relevant Medications   gabapentin (NEURONTIN) 100 MG capsule   acetaminophen (TYLENOL) 500 MG tablet   Osteoarthritis of multiple joints    Chronic  stable to gradual worsening multiple joint osteoarthritis, worse knees, hands, known significant spinal DJD - Regarding knees, no instability or locking, unlikely meniscus based on exam - Hands do not have RA appearance - Back s/p multiple surgery - responds to  NSAIDs  Plan: 1. Discussion on management and prognosis of arthritis 2. Re-start high dose Tylenol regular regimen, then PRN 3. New rx diclofenac topical 1% gel TID PRN - indicated for patient with CKD, trial on prior meloxicam, aleve, naproxen, ibuprofen, for knees and hands - however if not approved or not helping can consider repeat trial back on Meloxicam 4. Continue Robaxin PRN - offer to switch to other muscle relaxant, mostly for knees and back 5.RICE therapy (rest, ice, knee compression sleeves, elevation) for swelling, activity modification 6. Consider update X-rays 7. Follow-up 3 months - may need knee steroid injection vs referral to PT vs Ortho      Relevant Medications   diclofenac sodium (VOLTAREN) 1 % GEL   acetaminophen (TYLENOL) 500 MG tablet   Hypertension    Controlled HTN Without known complication Failed ACEi (allergy angioedema)  Plan: 1. Discussed changes to regimen - concern amlodipine may be causing some of his lower ext edema, agree to trial HOLDING Amlodipine '5mg'$  for period of time, if improve edema, then STOP Amlodipine, notify office and we can switch to alternative med - possibly BB vs thiazide (especially if DC regular lasix) 2. Similarly - will do trial HOLDING Lasix '20mg'$  x 2 AM - to see if benefiting his edema, if not helping then will DC but may switch to PRN only, since he is at risk for worsening CKD 3. Improve hydration, advised need to start some regular exercise and improved low sodium diet 4. Monitor BP outside office 5. 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 Not on therapy 2. Encourage improved lifestyle - low carb, low sugar diet, reduce portion size, start regular exercise 3. Continue Statin. - future consider ASA, possible ARB but caution with ACEi angioedema 4. DM Foot exam done today / Advised to schedule DM ophtho exam, send record 5. 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 CKD not significant to cause degree of edema.  Plan: 1. Discussed changes to regimen - concern amlodipine may be causing some of his lower ext edema, agree to trial HOLDING Amlodipine '5mg'$  for period of time, if improve edema, then STOP Amlodipine, notify office and we can switch to alternative med - possibly BB vs thiazide (especially if DC regular lasix) 2. Similarly - will do trial HOLDING Lasix '20mg'$  x 2 AM - to see if benefiting his edema, if not helping then will DC but may switch to PRN only, since he is at risk for worsening CKD 3. Improve hydration, advised need to start some regular exercise and improved low sodium diet 4. On lower Gabapentin already 5. RICE therapy 6. Future follow-up if not improved - consider referral to Vascular Surgery, may need venous reflux imaging         Meds ordered this encounter  Medications  . gabapentin (NEURONTIN) 100 MG capsule    Sig: TAKE 1 CAPSULE (100 MG TOTAL) BY  MOUTH 3 (THREE) TIMES DAILY.    Refill:  12  . diclofenac sodium (VOLTAREN) 1 % GEL    Sig: Apply 2 g topically 3 (three) times daily. As needed for hands and knee,  arthritis.    Dispense:  100 g    Refill:  5  . acetaminophen (TYLENOL) 500 MG tablet    Sig: Take 2 tablets (1,000 mg total) by mouth every 8 (eight) hours as needed.    Dispense:  30 tablet    Refill:  0    Follow up plan: Return in about 3 months (around 02/12/2017) for Swelling / Arthritis / Back(discuss referral).  [name], [facility] 11/12/2016, 11:52 PM

## 2016-11-16 ENCOUNTER — Encounter: Payer: PPO | Admitting: Family Medicine

## 2016-12-07 ENCOUNTER — Encounter: Payer: Self-pay | Admitting: Family Medicine

## 2016-12-07 ENCOUNTER — Ambulatory Visit (INDEPENDENT_AMBULATORY_CARE_PROVIDER_SITE_OTHER): Payer: PPO | Admitting: Family Medicine

## 2016-12-07 VITALS — BP 164/80 | HR 70 | Temp 98.7°F | Resp 16 | Ht 70.0 in | Wt 265.0 lb

## 2016-12-07 DIAGNOSIS — M1A0790 Idiopathic chronic gout, unspecified ankle and foot, without tophus (tophi): Secondary | ICD-10-CM | POA: Diagnosis not present

## 2016-12-07 DIAGNOSIS — M5136 Other intervertebral disc degeneration, lumbar region: Secondary | ICD-10-CM | POA: Diagnosis not present

## 2016-12-07 DIAGNOSIS — M150 Primary generalized (osteo)arthritis: Secondary | ICD-10-CM | POA: Diagnosis not present

## 2016-12-07 DIAGNOSIS — M1000 Idiopathic gout, unspecified site: Secondary | ICD-10-CM | POA: Diagnosis not present

## 2016-12-07 DIAGNOSIS — Z981 Arthrodesis status: Secondary | ICD-10-CM

## 2016-12-07 DIAGNOSIS — M159 Polyosteoarthritis, unspecified: Secondary | ICD-10-CM

## 2016-12-07 MED ORDER — PREDNISONE 10 MG PO TABS: ORAL_TABLET | ORAL | 0 refills | Status: DC

## 2016-12-07 MED ORDER — NAPROXEN 250 MG PO TABS: 250.0000 mg | ORAL_TABLET | Freq: Two times a day (BID) | ORAL | 0 refills | Status: DC

## 2016-12-07 MED ORDER — BACLOFEN 10 MG PO TABS: 5.0000 mg | ORAL_TABLET | Freq: Three times a day (TID) | ORAL | 2 refills | Status: DC | PRN

## 2016-12-07 NOTE — Progress Notes (Signed)
Subjective:    Patient ID: Danny Patterson, male    DOB: 1945/08/09, 71 y.o.   MRN: [id number]  [name] is a 71 y.o. male presenting on 12/07/2016 for Gout (pain both legs onset 4 days)  Patient presents for a same day appointment.  HPI   GOUT - Reports new problem now with acute on chronic gout flare seems to affect both feet, L great toe worst currently, onset 4 days ago, with redness swelling of L great toe. Similar to prior gout flares. - Previously managed at [place], in past he was on Allopurinol, uncertain how long he has been off of this, was on for prophylaxis, also was on Colchicine and Indocin in past. He was stopped on Indocin due to affecting kidney function - Now only takes occasional aleve, and Tylenol - Admits some poor dietary triggers for gout - Denies injury, trauma, spreading redness, fever/chills  Additional concern - Chronic low back pain, OA/DJD multiple joints, lumbar DJD with s/p prior spinal surgery fusion, see note from last visit 11/12/16 for details on background information. 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 Requesting 2nd opinion, no longer interested in neurosurgery.   Social History  Substance Use Topics  . Smoking status: Former Smoker    Types: Cigarettes  . Smokeless tobacco: Never Used  . Alcohol use 1.8 oz/week    3 Cans of beer per week     Comment: 05/04/16 3 beers daily    Review of Systems Per HPI unless specifically indicated above     Objective:    BP (!) 164/80   Pulse 70   Temp 98.7 F (37.1 C) (Oral)   Resp 16   Ht 5' 10" (1.778 m)   Wt 265 lb (120.2 kg)   BMI 38.02 kg/m   Wt Readings from Last 3 Encounters:  12/07/16 265 lb (120.2 kg)  11/12/16 261 lb (118.4 kg)  08/10/16 260 lb (117.9 kg)    Physical Exam  Constitutional: He is oriented to person, place, and time. He appears well-developed and well-nourished. No distress.  Well-appearing, comfortable,  cooperative  HENT:  Head: Normocephalic and atraumatic.  Mouth/Throat: Oropharynx is clear and moist.  Eyes: Conjunctivae are normal. Right eye exhibits no discharge. Left eye exhibits no discharge.  Cardiovascular: Normal rate.   Pulmonary/Chest: Effort normal.  Musculoskeletal: He exhibits edema (Slightly increased L foot and great toe compared to R. Bilateral lower ankle and feet +1 pitting edema ).  Neurological: He is alert and oriented to person, place, and time.  Skin: Skin is warm and dry. No rash noted. He is not diaphoretic. There is erythema (Left great toe mild erythema tender to touch).  No ulceration or skin breaks on feet. No extending erythema, localized L great toe  Psychiatric: He has a normal mood and affect. His behavior is normal.  Well groomed, good eye contact, normal speech and thoughts  Nursing note and vitals reviewed.  Results for orders placed or performed in visit on 11/09/16  Hemoglobin A1c  Result Value Ref Range   Hgb A1c MFr Bld 5.5 <5.7 %   Mean Plasma Glucose 111 mg/dL  COMPLETE METABOLIC PANEL WITH GFR  Result Value Ref Range   Sodium 142 135 - 146 mmol/L   Potassium 4.2 3.5 - 5.3 mmol/L   Chloride 104 98 - 110 mmol/L   CO2 23 20 - 32 mmol/L   Glucose, Bld 98 65 - 99 mg/dL   BUN 24  7 - 25 mg/dL   Creat 1.19 (H) 0.70 - 1.18 mg/dL   Total Bilirubin 0.5 0.2 - 1.2 mg/dL   Alkaline Phosphatase 90 40 - 115 U/L   AST 31 10 - 35 U/L   ALT 21 9 - 46 U/L   Total Protein 6.5 6.1 - 8.1 g/dL   Albumin 4.2 3.6 - 5.1 g/dL   Calcium 9.1 8.6 - 10.3 mg/dL   GFR, Est African American 71 >=60 mL/min   GFR, Est Non African American 61 >=60 mL/min  CBC with Differential/Platelet  Result Value Ref Range   WBC 6.7 3.8 - 10.8 K/uL   RBC 4.33 4.20 - 5.80 MIL/uL   Hemoglobin 13.4 13.2 - 17.1 g/dL   HCT 41.0 38.5 - 50.0 %   MCV 94.7 80.0 - 100.0 fL   MCH 30.9 27.0 - 33.0 pg   MCHC 32.7 32.0 - 36.0 g/dL   RDW 14.8 11.0 - 15.0 %   Platelets 189 140 - 400 K/uL    MPV 9.1 7.5 - 12.5 fL   Neutro Abs 3,283 1,500 - 7,800 cells/uL   Lymphs Abs 2,613 850 - 3,900 cells/uL   Monocytes Absolute 402 200 - 950 cells/uL   Eosinophils Absolute 335 15 - 500 cells/uL   Basophils Absolute 67 0 - 200 cells/uL   Neutrophils Relative % 49 %   Lymphocytes Relative 39 %   Monocytes Relative 6 %   Eosinophils Relative 5 %   Basophils Relative 1 %   Smear Review Criteria for review not met   Lipid panel  Result Value Ref Range   Cholesterol 144 <200 mg/dL   Triglycerides 89 <150 mg/dL   HDL 47 >40 mg/dL   Total CHOL/HDL Ratio 3.1 <5.0 Ratio   VLDL 18 <30 mg/dL   LDL Cholesterol 79 <100 mg/dL  VITAMIN D 25 Hydroxy (Vit-D Deficiency, Fractures)  Result Value Ref Range   Vit D, 25-Hydroxy 24 (L) 30 - 100 ng/mL  PSA, Total with Reflex to PSA, Free  Result Value Ref Range   PSA, Total 1.0 <=4.0 ng/mL      Assessment & Plan:   Problem List Items Addressed This Visit    S/P lumbar fusion   Relevant Orders   Ambulatory referral to Physical Medicine Rehab   Osteoarthritis of multiple joints   Relevant Medications   predniSONE (DELTASONE) 10 MG tablet   naproxen (NAPROSYN) 250 MG tablet   baclofen (LIORESAL) 10 MG tablet   Other Relevant Orders   Ambulatory referral to Physical Medicine Rehab   Gout - Primary    Clinically consistent with acute gout flare of Left great toe MTP also some pain R ankle, onset 4 days ago with worsening, tried tylenol, limited interventions - Known chronic history of gout, recurrent flare in this joint - No evidence of infection or source - Previously on Allopurinol, Colchicine - stopped due to unclear reasons after hospitalization, was concern of worsening CKD in past, now kidney improved - Last uric acid level 5.4 (2016)  Plan: 1. Start Prednisone burst 8 day taper - given likely limited benefit colchicine, patient concern with NSAIDs high dose due to CKD 2. After prednisone - Start preventative NSAID Naproxen 228m BID for at  least 3-4 weeks for prophylaxis 3. Avoid excessive ambulation, relative rest, ice if helps, can take Tylenol PRN 4. Avoid food triggers (red meat, alcohol) 5. Follow-up within 4 weeks check Uric acid level / Creatinine, once acute flare resolved, discuss resume allopurinol and continue  NSAID prophyalxis - future consider rheum if refractory problem      Relevant Medications   predniSONE (DELTASONE) 10 MG tablet   naproxen (NAPROSYN) 250 MG tablet   Other Relevant Orders   BASIC METABOLIC PANEL WITH GFR   Uric acid   BASIC METABOLIC PANEL WITH GFR   Uric acid   Degenerative disc disease, lumbar   Relevant Medications   predniSONE (DELTASONE) 10 MG tablet   naproxen (NAPROSYN) 250 MG tablet   baclofen (LIORESAL) 10 MG tablet   Other Relevant Orders   Ambulatory referral to Physical Medicine Rehab      Referral to PM&R - Dr Letta Pate, for second opinion, since no longer plans to follow-up with Neurosurgery  Meds ordered this encounter  Medications  . predniSONE (DELTASONE) 10 MG tablet    Sig: Take 4 tablets (36m) for 2 days, then 3 tab (354m for 2 days, then 2 tab (2016mfor 2 days, then 1 tab (34m2mor 2 days.    Dispense:  20 tablet    Refill:  0  . naproxen (NAPROSYN) 250 MG tablet    Sig: Take 1 tablet (250 mg total) by mouth 2 (two) times daily with a meal. Start after finish prednisone. For gout prevention    Dispense:  60 tablet    Refill:  0  . baclofen (LIORESAL) 10 MG tablet    Sig: Take 0.5-1 tablets (5-10 mg total) by mouth 3 (three) times daily as needed for muscle spasms.    Dispense:  30 each    Refill:  2    Follow up plan: Return in about 4 weeks (around 01/04/2017) for Gout.  AlexNobie Putnam [facility] 12/08/2016, 12:43 AM

## 2016-12-07 NOTE — Patient Instructions (Addendum)
Thank you for coming to the clinic today.  1. Your Foot/ankle pain is most likely caused an Acute Gout Flare - Gout is a chronic problem that will have episodic flare ups with pain, redness, swelling of a joint, most common spots are big toe, foot and ankle, knee or sometimes hands or wrists. It is caused by small crystals made of Uric Acid that form in the joint causing pain and swelling.  Start Prednisone today as prescribed taper down over 8 days - Do NOT take Naproxen or indocin while you are on Prednisone - You can CONTINUE Tylenol  AFTER 8 days - finish prednisone > start taking Naproxen  - 1 pill TWICE daily  STOP taking Gabapentin  SWITCH Methocarbamol to Baclofen.  Start taking Baclofen (Lioresal)  (muscle relaxant) - start with half (cut) to one whole pill at night as needed for next 1-3 nights (may make you drowsy, caution with driving) see how it affects you, then if tolerated increase to one pill 2 to 3 times a day or (every 8 hours as needed)  For all gout flares, the sooner you start the medication, then the shorter the flare lasts. Go ahead and start taking Naproxen  1 to 2 tablets twice daily as soon as you get significant gout pain and swelling again in the future, and if it is not improving within 48 hours then you can follow-up at our office. OR if you don't start medication you can come to the office within 24-48 hours for treatment here.  Gout flares can repeat again soon after they resolve in the same spot or other joints, and may need repeat treatment.  Our goal is to prevent future gout flares. Try to avoid dietary triggers that are the most common causes of gout flares. - Avoid the following foods/drinks: - Red meat, organ meat (liver) - Alcohol (especially beer, also wine, liquor) - Processed foods / carbs (white bread, white rice, pasta, sugar) - Sugary drinks (sweet tea, soda) - Shellfish, shrimp / lobster  - Foods that are preferred to eat: -  Beans, Lentils, Whole grains, Quinoa - Fruits, Vegetables - Dairy, Cheese, Yogurt - Soy based protein  We can do a blood test to check Uric Acid level, but the best way to confirm a diagnosis and help [place] determine the exact treatment you need is to drain the swelling and analyze a sample of the fluid for crystals. We do not do this procedure here at our office, and would ask that you notify us or come in for evaluation and we can get you urgently scheduled with Orthopedics or Rheumatology office.  DUE for NON FASTING BLOOD WORK   SCHEDULE "Lab Only" visit in the morning at the clinic for lab draw in 3-4 WEEKS  - Make sure Lab Only appointment is at about 1 week before your next appointment, so that results will be available  For Lab Results, once available within 2-3 days of blood draw, you can can log in to MyChart online to view your results and a brief explanation. Also, we can discuss results at next follow-up visit.   Please schedule a Follow-up Appointment to: Return in about 4 weeks (around 01/04/2017) for Gout.  If you have any other questions or concerns, please feel free to call the clinic or send a message through MyChart. You may also schedule an earlier appointment if necessary.  Additionally, you may be receiving a survey about your experience at our clinic within a few days  to 1 week by e-mail or mail. We value your feedback.  [name], DO [facility], [place]

## 2016-12-08 NOTE — Assessment & Plan Note (Signed)
Clinically consistent with acute gout flare of Left great toe MTP also some pain R ankle, onset 4 days ago with worsening, tried tylenol, limited interventions - Known chronic history of gout, recurrent flare in this joint - No evidence of infection or source - Previously on Allopurinol, Colchicine - stopped due to unclear reasons after hospitalization, was concern of worsening CKD in past, now kidney improved - Last uric acid level 5.4 (2016)  Plan: 1. Start Prednisone burst 8 day taper - given likely limited benefit colchicine, patient concern with NSAIDs high dose due to CKD 2. After prednisone - Start preventative NSAID Naproxen  BID for at least 3-4 weeks for prophylaxis 3. Avoid excessive ambulation, relative rest, ice if helps, can take Tylenol PRN 4. Avoid food triggers (red meat, alcohol) 5. Follow-up within 4 weeks check Uric acid level / Creatinine, once acute flare resolved, discuss resume allopurinol and continue NSAID prophyalxis - future consider rheum if refractory problem

## 2016-12-09 ENCOUNTER — Ambulatory Visit: Payer: PPO | Admitting: Family Medicine

## 2016-12-29 ENCOUNTER — Other Ambulatory Visit: Payer: PPO

## 2016-12-29 ENCOUNTER — Ambulatory Visit (INDEPENDENT_AMBULATORY_CARE_PROVIDER_SITE_OTHER): Payer: PPO | Admitting: Family Medicine

## 2016-12-29 ENCOUNTER — Encounter: Payer: Self-pay | Admitting: Family Medicine

## 2016-12-29 ENCOUNTER — Ambulatory Visit
Admission: RE | Admit: 2016-12-29 | Discharge: 2016-12-29 | Disposition: A | Discharge status: Home / Self Care | Payer: PPO | Source: Ambulatory Visit | Attending: Family Medicine | Admitting: Family Medicine

## 2016-12-29 VITALS — BP 139/69 | HR 61 | Temp 98.1°F | Resp 16 | Ht 70.0 in | Wt 261.0 lb

## 2016-12-29 DIAGNOSIS — M1000 Idiopathic gout, unspecified site: Secondary | ICD-10-CM | POA: Diagnosis not present

## 2016-12-29 DIAGNOSIS — M11269 Other chondrocalcinosis, unspecified knee: Secondary | ICD-10-CM | POA: Insufficient documentation

## 2016-12-29 DIAGNOSIS — M11261 Other chondrocalcinosis, right knee: Secondary | ICD-10-CM | POA: Diagnosis not present

## 2016-12-29 DIAGNOSIS — M159 Polyosteoarthritis, unspecified: Secondary | ICD-10-CM

## 2016-12-29 DIAGNOSIS — M150 Primary generalized (osteo)arthritis: Secondary | ICD-10-CM | POA: Diagnosis not present

## 2016-12-29 DIAGNOSIS — M170 Bilateral primary osteoarthritis of knee: Secondary | ICD-10-CM | POA: Diagnosis not present

## 2016-12-29 DIAGNOSIS — G8929 Other chronic pain: Secondary | ICD-10-CM | POA: Diagnosis not present

## 2016-12-29 DIAGNOSIS — M25561 Pain in right knee: Secondary | ICD-10-CM

## 2016-12-29 DIAGNOSIS — M25562 Pain in left knee: Secondary | ICD-10-CM | POA: Insufficient documentation

## 2016-12-29 DIAGNOSIS — M11262 Other chondrocalcinosis, left knee: Secondary | ICD-10-CM | POA: Insufficient documentation

## 2016-12-29 DIAGNOSIS — M1A0790 Idiopathic chronic gout, unspecified ankle and foot, without tophus (tophi): Secondary | ICD-10-CM

## 2016-12-29 DIAGNOSIS — M179 Osteoarthritis of knee, unspecified: Secondary | ICD-10-CM | POA: Diagnosis not present

## 2016-12-29 LAB — BASIC METABOLIC PANEL WITH GFR
BUN/Creatinine Ratio: 20 (calc) (ref 6–22)
BUN: 29 mg/dL — AB (ref 7–25)
CALCIUM: 9.1 mg/dL (ref 8.6–10.3)
CHLORIDE: 103 mmol/L (ref 98–110)
CO2: 27 mmol/L (ref 20–32)
Creat: 1.46 mg/dL — ABNORMAL HIGH (ref 0.70–1.18)
GFR, EST AFRICAN AMERICAN: 55 mL/min/{1.73_m2} — AB (ref >=60)
GFR, Est Non African American: 48 mL/min/{1.73_m2} — ABNORMAL LOW (ref >=60)
Glucose, Bld: 95 mg/dL (ref 65–139)
POTASSIUM: 4.1 mmol/L (ref 3.5–5.3)
Sodium: 140 mmol/L (ref 135–146)

## 2016-12-29 LAB — URIC ACID: URIC ACID, SERUM: 12.3 mg/dL — AB (ref 4.0–8.0)

## 2016-12-29 MED ORDER — NAPROXEN 250 MG PO TABS: 250.0000 mg | ORAL_TABLET | Freq: Two times a day (BID) | ORAL | 3 refills | Status: DC

## 2016-12-29 NOTE — Assessment & Plan Note (Signed)
Presumed etiology of L knee pain, both knees appear consistent based on X-ray

## 2016-12-29 NOTE — Patient Instructions (Addendum)
Thank you for coming to the clinic today.  1. Increase Naproxen  - take TWO pills TWICE daily for 1 week until you return to office - This may help pain of either arthritis and/or gout - Will check Left Knee X-ray today, and one x-ray to compare both knees - Continue Baclofen muscle relaxant  2. After lab results and X-rays will review next week in office  If needed we can consider a Steroid Injection into knee or may need referral to specialist - we also can discuss gout prevention medicine based on labs.  Please schedule a Follow-up Appointment to: Return in about 6 days (around 01/04/2017) for as scheduled for gout lab/review.  If you have any other questions or concerns, please feel free to call the clinic or send a message through MyChart. You may also schedule an earlier appointment if necessary.  Additionally, you may be receiving a survey about your experience at our clinic within a few days to 1 week by e-mail or mail. We value your feedback.  [name], DO [facility], [place]

## 2016-12-29 NOTE — Assessment & Plan Note (Signed)
Acute on chronic L > R generalized Knee pain and episodic swelling without known injury or trauma. Presumed OA/DJD with involvement of other joints as well has h/o gout w/ recent flare L ankle/foot. - Able to bear weight, no knee instability - No prior history of knee surgery, arthroscopy - Not responding to conservative therapy - No prior X-rays available Knees  Plan: 1. 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 Increase Naproxen  BID to  BID (x 2 of  tabs) BID for 1 week, had been taking 250 BID daily for gout prophylaxis after recent prednisone burst 3. Continue Tylenol regularly and PRN 4. May hold diclofenac on knee if not helping 5. Continue Baclofen PRN, remains off methocarbamol 6. RICE therapy (rest, ice, compression, elevation) for swelling, activity modification 7. Follow-up 1 week as scheduled - if still not improved consider other options steroid injection L knee, may draw fluids for crystal studies, consider colchicine prophylaxis, questionable if allopurinol for gout would reduce risk of CPPD, may refer to Rheumatology

## 2016-12-29 NOTE — Assessment & Plan Note (Signed)
Seems acute LLE MTP/ankle pain has resolved on prednisone burst/taper, now has had knee pain, see A&P - Pending labs Uric Acid, chemistry, follow-up next week, remains on NSAID gout prophylaxis, may consider colchicine instead vs allopurinol in future

## 2016-12-29 NOTE — Progress Notes (Addendum)
Subjective:    Patient ID: Danny Patterson, male    DOB: September 07, 1945, 71 y.o.   MRN: [id number]  Danny Patterson is a 71 y.o. male presenting on 12/29/2016 for Knee Pain (left knee is worst as compare to Right, pain associated with ROM) and Gout  Patient presents for a same day appointment.  HPI   Left Knee Pain, Acute on Chronic b/l Knee Pain / History of Gout - Last visit with me 12/07/16, for same problem, treated gout left toe / ankle with Prednisone 8 day taper, see prior notes for background information. - Interval update with dramatic improvement with initial first few days of Prednisone taper for Left foot/ankle, then developed some worsening pain in Left Knee - Today patient reports he has remained on Naproxen '250mg'$  BID since finish prednisone, and seems to help but may not be enough, describes L knee pain as mostly constant difficulty worse with weight bearing and feels "tight" and concern intermittent swelling of knee, limiting mobility - Taking Tylenol PRN - Tried topical diclofenac without relief on knee but helps hands - Prior knee injections, none recently - No recent knee x-rays available - Denies any fall, trauma, injury, redness, fever/chills, numbness tingling  Health Maintenance: - Due for Flu Shot, declines today despite counseling on benefits  Depression screen [facility] 2/9 07/13/2016 11/06/2015 07/21/2015  Decreased Interest 0 0 0  Down, Depressed, Hopeless 0 0 0  PHQ - 2 Score 0 0 0    Social History  Substance Use Topics  . Smoking status: Former Smoker    Types: Cigarettes  . Smokeless tobacco: Never Used  . Alcohol use 1.8 oz/week    3 Cans of beer per week     Comment: 05/04/16 3 beers daily    Review of Systems Per HPI unless specifically indicated above     Objective:    BP 139/69   Pulse 61   Temp 98.1 F (36.7 C) (Oral)   Resp 16   Ht '5\' 10"'$  (1.778 m)   Wt 261 lb (118.4 kg)   BMI 37.45 kg/m   Wt Readings from Last 3 Encounters:  12/29/16 261 lb  (118.4 kg)  12/07/16 265 lb (120.2 kg)  11/12/16 261 lb (118.4 kg)    Physical Exam  Constitutional: He is oriented to person, place, and time. He appears well-developed and well-nourished. No distress.  Well-appearing, currently uncomfortable due to L knee pain, cooperative  HENT:  Head: Normocephalic and atraumatic.  Mouth/Throat: Oropharynx is clear and moist.  Eyes: Conjunctivae are normal. Right eye exhibits no discharge. Left eye exhibits no discharge.  Cardiovascular: Normal rate and intact distal pulses.   Pulmonary/Chest: Effort normal.  Musculoskeletal: He exhibits no edema (improved lower extremity).  Bilateral Knees Inspection: Bulky appearance and symmetrical. No ecchymosis or effusion, maybe minimal soft tissue surrounding edema L > R Palpation: Non-tender joint line. Mild fine crepitus L knee ROM: Limited knee active ROM bilaterally Strength: 5/5 intact knee flex/ext, ankle dorsi/plantarflex Neurovascular: distally intact sensation light touch and pulses  Neurological: He is alert and oriented to person, place, and time.  Skin: Skin is warm and dry. No rash noted. He is not diaphoretic. No erythema.  Psychiatric: He has a normal mood and affect. His behavior is normal.  Well groomed, good eye contact, normal speech and thoughts  Nursing note and vitals reviewed.  Results for orders placed or performed in visit on 11/09/16  Hemoglobin A1c  Result Value Ref Range   Hgb  A1c MFr Bld 5.5 <5.7 %   Mean Plasma Glucose 111 mg/dL  COMPLETE METABOLIC PANEL WITH GFR  Result Value Ref Range   Sodium 142 135 - 146 mmol/L   Potassium 4.2 3.5 - 5.3 mmol/L   Chloride 104 98 - 110 mmol/L   CO2 23 20 - 32 mmol/L   Glucose, Bld 98 65 - 99 mg/dL   BUN 24 7 - 25 mg/dL   Creat 1.19 (H) 0.70 - 1.18 mg/dL   Total Bilirubin 0.5 0.2 - 1.2 mg/dL   Alkaline Phosphatase 90 40 - 115 U/L   AST 31 10 - 35 U/L   ALT 21 9 - 46 U/L   Total Protein 6.5 6.1 - 8.1 g/dL   Albumin 4.2 3.6 - 5.1  g/dL   Calcium 9.1 8.6 - 10.3 mg/dL   GFR, Est African American 71 >=60 mL/min   GFR, Est Non African American 61 >=60 mL/min  CBC with Differential/Platelet  Result Value Ref Range   WBC 6.7 3.8 - 10.8 K/uL   RBC 4.33 4.20 - 5.80 MIL/uL   Hemoglobin 13.4 13.2 - 17.1 g/dL   HCT 41.0 38.5 - 50.0 %   MCV 94.7 80.0 - 100.0 fL   MCH 30.9 27.0 - 33.0 pg   MCHC 32.7 32.0 - 36.0 g/dL   RDW 14.8 11.0 - 15.0 %   Platelets 189 140 - 400 K/uL   MPV 9.1 7.5 - 12.5 fL   Neutro Abs 3,283 1,500 - 7,800 cells/uL   Lymphs Abs 2,613 850 - 3,900 cells/uL   Monocytes Absolute 402 200 - 950 cells/uL   Eosinophils Absolute 335 15 - 500 cells/uL   Basophils Absolute 67 0 - 200 cells/uL   Neutrophils Relative % 49 %   Lymphocytes Relative 39 %   Monocytes Relative 6 %   Eosinophils Relative 5 %   Basophils Relative 1 %   Smear Review Criteria for review not met   Lipid panel  Result Value Ref Range   Cholesterol 144 <200 mg/dL   Triglycerides 89 <150 mg/dL   HDL 47 >40 mg/dL   Total CHOL/HDL Ratio 3.1 <5.0 Ratio   VLDL 18 <30 mg/dL   LDL Cholesterol 79 <100 mg/dL  VITAMIN D 25 Hydroxy (Vit-D Deficiency, Fractures)  Result Value Ref Range   Vit D, 25-Hydroxy 24 (L) 30 - 100 ng/mL  PSA, Total with Reflex to PSA, Free  Result Value Ref Range   PSA, Total 1.0 <=4.0 ng/mL      Assessment & Plan:   Problem List Items Addressed This Visit    Bilateral chronic knee pain - Primary    Acute on chronic L > R generalized Knee pain and episodic swelling without known injury or trauma. Presumed OA/DJD with involvement of other joints as well has h/o gout w/ recent flare L ankle/foot. - Able to bear weight, no knee instability - No prior history of knee surgery, arthroscopy - Not responding to conservative therapy - No prior X-rays available Knees  Plan: 1. 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 Increase Naproxen '250mg'$  BID to '500mg'$  BID (x 2 of '250mg'$  tabs) BID for 1 week, had been taking 250 BID daily for gout prophylaxis after recent prednisone burst 3. Continue Tylenol regularly  and PRN 4. May hold diclofenac on knee if not helping 5. Continue Baclofen PRN, remains off methocarbamol 6. RICE therapy (rest, ice, compression, elevation) for swelling, activity modification 7. Follow-up 1 week as scheduled - if still not improved consider other options steroid injection L knee, may draw fluids for crystal studies, consider colchicine prophylaxis, questionable if allopurinol for gout would reduce risk of CPPD, may refer to Rheumatology      Relevant Medications   gabapentin (NEURONTIN) 300 MG capsule   naproxen (NAPROSYN) 250 MG tablet   Gout    Seems acute LLE MTP/ankle pain has resolved on prednisone burst/taper, now has had knee pain, see A&P - Pending labs Uric Acid, chemistry, follow-up next week, remains on NSAID gout prophylaxis, may consider colchicine instead vs allopurinol in future      Relevant Medications   naproxen (NAPROSYN) 250 MG tablet   Osteoarthritis of multiple joints   Relevant Medications   naproxen (NAPROSYN) 250 MG tablet   Other Relevant Orders   DG Knee Complete 4 Views Left (Completed)   DG Knee Bilateral Standing AP (Completed)   Pseudogout of knee    Presumed etiology of L knee pain, both knees appear consistent based on X-ray         Meds ordered this encounter  Medications  . gabapentin (NEURONTIN) 300 MG capsule    Sig: TAKE 1 CAPSULE (300 MG TOTAL) BY MOUTH 3 (THREE) TIMES DAILY.    Refill:  6  . DISCONTD: methocarbamol (ROBAXIN) 500 MG tablet    Sig: TAKE 1 TABLET (500 MG TOTAL) BY MOUTH 3 (THREE) TIMES DAILY. FOR MUSCLE ACHES/SPASMS    Refill:  5  . naproxen (NAPROSYN) 250 MG tablet      Sig: Take 1 tablet (250 mg total) by mouth 2 (two) times daily with a meal. For gout prevention. Flare may take 2 pills twice daily for 1 week.    Dispense:  60 tablet    Refill:  3    Follow up plan: Return in about 6 days (around 01/04/2017) for as scheduled for gout lab/review.  [name], DO [facility] 12/29/2016, 8:33 PM

## 2016-12-30 ENCOUNTER — Other Ambulatory Visit: Payer: PPO

## 2016-12-30 ENCOUNTER — Telehealth: Payer: Self-pay | Admitting: Family Medicine

## 2016-12-30 ENCOUNTER — Other Ambulatory Visit: Payer: Self-pay | Admitting: Family Medicine

## 2016-12-30 DIAGNOSIS — E790 Hyperuricemia without signs of inflammatory arthritis and tophaceous disease: Secondary | ICD-10-CM

## 2016-12-30 DIAGNOSIS — M25561 Pain in right knee: Secondary | ICD-10-CM

## 2016-12-30 DIAGNOSIS — M11262 Other chondrocalcinosis, left knee: Secondary | ICD-10-CM

## 2016-12-30 DIAGNOSIS — G8929 Other chronic pain: Secondary | ICD-10-CM

## 2016-12-30 DIAGNOSIS — M25562 Pain in left knee: Secondary | ICD-10-CM

## 2016-12-30 DIAGNOSIS — M1A0790 Idiopathic chronic gout, unspecified ankle and foot, without tophus (tophi): Secondary | ICD-10-CM

## 2016-12-30 DIAGNOSIS — N179 Acute kidney failure, unspecified: Secondary | ICD-10-CM

## 2016-12-30 MED ORDER — COLCHICINE 0.6 MG PO TABS: 0.6000 mg | ORAL_TABLET | Freq: Two times a day (BID) | ORAL | 3 refills | Status: DC

## 2016-12-30 MED ORDER — ALLOPURINOL 100 MG PO TABS: 100.0000 mg | ORAL_TABLET | Freq: Every day | ORAL | 5 refills | Status: DC

## 2016-12-30 NOTE — Telephone Encounter (Signed)
Called patient to review recent lab results, spoke with Danny Patterson.  Uric Acid 12.3, previous result 1 year ago 7.8, now concern with elevated Uric acid contributing to inc gout flares, additionally concern with pseudogout based on appearance of knee x-rays, discussed yesterday. Patient used to be on Uric Acid lowering therapy with Allopurinol. Discussed that we should resume this with Allopurinol  daily for now.  Re-check blood work in 3-4 weeks to repeat BMET Cr trend with AKI and Uric Acid to see if progress, may titrate dose up by  every few weeks to goal of  daily in future if needed.  He stated that increased Naproxen  x 2 for  BID has not helped his knee pain, and he is okay to stop this.  BMET showed elevated Cr concern AoCKD with Cr 1.46 previous 1 mo ago at 1.19, prior has had higher readings. Advised him to STOP Naproxen as mentioned above. And increase fluid hydration with water.  Instead of Naproxen gout prophylaxis and also with starting Allopurinol, we agree to switch to Colchicine 0.6mg  BID daily prophylaxis instead, he was on this previously in 2017. Unsure why stopped. New rx sent to pharmacy, start both new meds at once and stop naproxen.  He understood plan, and will follow-up next week as scheduled. Future orders for BMET and Uric Acid ordered again in 3-4 weeks. Also will review recommendation for referral to Rheumatology vs Orthopedics for future management.  Additionally I spoke with [facility], and reviewed this patient case with their staff, and they recommended referral given the complexity of this patient with severe OA, gout and pseudogout appearance. They can also arrange orthopedics follow-up if needed. Will plan on referral next week when patient arrives in office.  [name], DO [facility] 12/30/2016, 1:46 PM

## 2017-01-04 ENCOUNTER — Encounter: Payer: Self-pay | Admitting: Family Medicine

## 2017-01-04 ENCOUNTER — Ambulatory Visit (INDEPENDENT_AMBULATORY_CARE_PROVIDER_SITE_OTHER): Payer: PPO | Admitting: Family Medicine

## 2017-01-04 VITALS — BP 146/78 | HR 72 | Temp 98.4°F | Resp 16 | Ht 70.0 in | Wt 256.0 lb

## 2017-01-04 DIAGNOSIS — N401 Enlarged prostate with lower urinary tract symptoms: Secondary | ICD-10-CM | POA: Diagnosis not present

## 2017-01-04 DIAGNOSIS — G6281 Critical illness polyneuropathy: Secondary | ICD-10-CM

## 2017-01-04 DIAGNOSIS — N138 Other obstructive and reflux uropathy: Secondary | ICD-10-CM | POA: Insufficient documentation

## 2017-01-04 DIAGNOSIS — M11262 Other chondrocalcinosis, left knee: Secondary | ICD-10-CM | POA: Diagnosis not present

## 2017-01-04 DIAGNOSIS — M1A0790 Idiopathic chronic gout, unspecified ankle and foot, without tophus (tophi): Secondary | ICD-10-CM | POA: Diagnosis not present

## 2017-01-04 DIAGNOSIS — M25562 Pain in left knee: Secondary | ICD-10-CM | POA: Diagnosis not present

## 2017-01-04 DIAGNOSIS — M25561 Pain in right knee: Secondary | ICD-10-CM

## 2017-01-04 DIAGNOSIS — Z87442 Personal history of urinary calculi: Secondary | ICD-10-CM

## 2017-01-04 DIAGNOSIS — E782 Mixed hyperlipidemia: Secondary | ICD-10-CM | POA: Diagnosis not present

## 2017-01-04 DIAGNOSIS — N179 Acute kidney failure, unspecified: Secondary | ICD-10-CM

## 2017-01-04 DIAGNOSIS — E876 Hypokalemia: Secondary | ICD-10-CM

## 2017-01-04 DIAGNOSIS — G8929 Other chronic pain: Secondary | ICD-10-CM

## 2017-01-04 DIAGNOSIS — N183 Chronic kidney disease, stage 3 (moderate): Secondary | ICD-10-CM

## 2017-01-04 MED ORDER — SILODOSIN 4 MG PO CAPS: 4.0000 mg | ORAL_CAPSULE | Freq: Every day | ORAL | 3 refills | Status: DC

## 2017-01-04 MED ORDER — ATORVASTATIN CALCIUM 20 MG PO TABS: 20.0000 mg | ORAL_TABLET | Freq: Every day | ORAL | 11 refills | Status: DC

## 2017-01-04 MED ORDER — POTASSIUM CHLORIDE ER 20 MEQ PO TBCR: 20.0000 meq | EXTENDED_RELEASE_TABLET | Freq: Every day | ORAL | 11 refills | Status: DC

## 2017-01-04 MED ORDER — COLCHICINE 0.6 MG PO TABS: 0.6000 mg | ORAL_TABLET | Freq: Two times a day (BID) | ORAL | 2 refills | Status: DC

## 2017-01-04 NOTE — Patient Instructions (Signed)
Thank you for coming to the clinic today.  1.  Need to check blood again due to kidney function  HOLD Lasix for next 2-3 weeks, until we re-check labs and contact you, or if swelling worse you can notify office, recommend elevating legs - Elevation - if significant swelling, lift leg above heart level (toes above your nose) to help reduce swelling, most helpful at night after day of being on your feet  ------------  Continue Allopurinol  daily - after 3-4 weeks can increase to TWO pills for  daily, and then after 2 months can increase to 3 pills or  daily. - NOTIFY office when need new rx with larger pill amount in bottle  While starting out on allopurinol - take Colchicine 0.6mg  TWICE daily for gout prevention - for up to 3 months, then can STOP and use ONLY as needed in the case of a gout flare, as discussed.  --------------------------  No metformin  ------- Resume atorvastatin as discussed  ----------------- Refilled new rx Potassium pill ONCE daily, can stop OTC potassium  -----------------------  Referral to Dr Wynn Banker for PM&R rehab - we can change referral to Neurologist Dr Malvin Johns at [place]  ------------------------   DUE for FASTING BLOOD WORK (no food or drink after midnight before the lab appointment, only water or coffee without cream/sugar on the morning of)  SCHEDULE "Lab Only" visit in the morning at the clinic for lab draw in 3-4 weeks for labs to check chemistry kidney and Uric Acid  - Make sure Lab Only appointment is at about 1 week before your next appointment, so that results will be available  For Lab Results, once available within 2-3 days of blood draw, you can can log in to MyChart online to view your results and a brief explanation. Also, we can discuss results at next follow-up visit.   Please schedule a Follow-up Appointment to: Return in about 4 weeks (around 02/01/2017) for Gout, labs.  If you have any other questions or  concerns, please feel free to call the clinic or send a message through MyChart. You may also schedule an earlier appointment if necessary.  Additionally, you may be receiving a survey about your experience at our clinic within a few days to 1 week by e-mail or mail. We value your feedback.  [name], DO [facility], [place]

## 2017-01-04 NOTE — Progress Notes (Signed)
Subjective:    Patient ID: Danny Patterson, male    DOB: 18-Nov-1945, 71 y.o.   MRN: [id number]  Danny Patterson is a 71 y.o. male presenting on 01/04/2017 for Gout (follow up and lab review)  Patient presents for a same day appointment.  HPI   FOLLOW-UP Chronic GOUT, Knee Pain Stiffness - Last visit with me 12/29/16, same problem, treated with bilateral knee x-rays, and given concern acute  Recurrent gout increased Naproxen prophylaxis from  BID to x 2 pills  BID for 1 week, see prior notes for background information. - Interval update with labs reviewed 12/30/16 with AKI see below he was then advised to STOP Naproxen and switched to Colchicine along with Allopurinol, due to Uric acid 12.3 - Today patient reports he did not start colchicine since not available at pharmacy, unsure why, and he has started Allopurinol. Currently L knee pain is improved. 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), Lumbar DJD S/p Lumbar Spinal Fusion Reviewed background from previous documentation 07/2016  - Patient with complex prior history dating back to 2000 with lumbar spinal surgery by Dr Jeral Fruit with improvement for many years, until later 2017 developed R foot drop and had repeat 2nd lumbar spine surgery 09/2015 due to balance and gait problems, ultimately post-op developed acute critical illness (respiratory failure, septic shock, required intubation and ICU stay for up to 3 weeks), thought to have progressive worsening balance, and critical illness generalized  neuropathy - No longer followed by Neurosurgery after his physician had retired - Additionally he was previously followed by Neurology [facility] (Dr Marjory Lies), last visit 08/09/16, limited options unlikely to improve from neurosurgery, work-up checked for other causes of polyneuropathy, exam suggestive of reduced pain sensation in feet, ankles, and fingers, also reduced vibratory sensation in toes and ankles. 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 - Admits some darker urine often - Denies known hematuria, reduced urine output  Presumed BPH with LUTS / obstructive symptoms - Prior known history of hospitalization in past requiring urinary foley catheter, he was also on flomax at that time with good improvement in urinary symptoms. Has never been on flomax regularly since. Additional complaint today with some difficulty urinating see AUA BPH score below - Reviewed similar problem back in 10/2016, with history of urinary retention, additionally had history of recurrent nephrolithiasis, possibly uric acid stones based on significant gout and uricemia. - Last PSA 1.0 (  10/2016), prior DRE was reported to not have significantly enlarged prostate - Asking about FLomax and if needs to see Urologist  AUA BPH Symptom Score over  past 1 month 1. Sensation of not emptying bladder post void - 4 2. Urinate less than 2 hour after finish last void - 3 3. Start/Stop several times during void - 0 4. Difficult to postpone urination - 3 5. Weak urinary stream - 3 6. Push or strain urination - 0 7. Nocturia - 2 times Total Score: 15 (Moderate BPH symptoms)  Health Maintenance: - Due for Flu Shot, declines today despite counseling on benefits  Depression screen [facility] 2/9 07/13/2016 11/06/2015 07/21/2015  Decreased Interest 0 0 0  Down, Depressed, Hopeless 0 0 0  PHQ - 2 Score 0 0 0    Social History  Substance Use Topics  . Smoking status: Former Smoker    Types: Cigarettes  . Smokeless tobacco: Never Used  . Alcohol use 1.8 oz/week    3 Cans of beer per week     Comment: 05/04/16 3 beers daily    Review of Systems Per HPI unless specifically indicated above     Objective:    BP (!) 146/78   Pulse 72   Temp 98.4 F (36.9 C) (Oral)   Resp 16   Ht  (1.778 m)   Wt 256 lb (116.1 kg)   BMI 36.73 kg/m   Wt Readings from Last 3 Encounters:  01/04/17 256 lb (116.1 kg)  12/29/16 261 lb (118.4 kg)  12/07/16 265 lb (120.2 kg)    Physical Exam  Constitutional: He is oriented to person, place, and time. He appears well-developed and well-nourished. No distress.  Well-appearing, mostly comfortable improved now less L knee pain, cooperative  HENT:  Head: Normocephalic and atraumatic.  Mouth/Throat: Oropharynx is clear and moist.  Eyes: Conjunctivae are normal. Right eye exhibits no discharge. Left eye exhibits no discharge.  Cardiovascular: Normal rate and intact distal pulses.   Pulmonary/Chest: Effort normal.  Musculoskeletal: He exhibits no edema (improved lower extremity).  Bilateral Knees Inspection: Bulky appearance and symmetrical. No ecchymosis or effusion, persistent residual soft tissue surrounding edema L > R Palpation: Non-tender joint line. Mild fine crepitus L knee ROM: improved ROM but has  chronic residual stiffness to bilateral knee joints, L>R, mostly intact Strength: 5/5 intact knee flex/ext, ankle dorsi/plantarflex Neurovascular: distally intact sensation light touch and pulses  Neurological: He is alert and oriented to person, place, and time.  Skin: Skin is warm and dry. No rash noted. He is not diaphoretic. No erythema.  Psychiatric: He has a normal mood and affect. His behavior is normal.  Well groomed, good eye contact, normal speech and thoughts  Nursing note and vitals reviewed.  I have personally reviewed the radiology report from bilateral knee AP standing and L knee X-rays on 12/29/16.  CLINICAL DATA:  Chronic left knee pain since back surgery in July 2017. Limited extension. Worsening pain with weight-bearing.  EXAM: BILATERAL KNEES STANDING - 1 VIEW  COMPARISON:  Left knee series same date.  FINDINGS: The mineralization and alignment are normal. There are tricompartmental degenerative changes in both knees with joint space narrowing, osteophytes and meniscal chondrocalcinosis. The degenerative changes appear worst in the lateral compartment of the right knee. No acute osseous findings are evident.  IMPRESSION: Tricompartmental degenerative changes in both knees with chondrocalcinosis.   Electronically Signed   By: Carey Bullocks M.D.   On: 12/29/2016 17:17 ------------------------------ CLINICAL DATA:  Chronic  left knee pain since 2017, worse with weight-bearing.  EXAM: LEFT KNEE - COMPLETE 4+ VIEW  COMPARISON:  None.  FINDINGS: No fracture or dislocation. Moderate to severe tricompartmental degenerative change of the knee with joint space loss, articular surface irregularity, subchondral sclerosis and osteophytosis. A small amount of chondrocalcinosis is seen within the lateral joint space. There is is spurring of the tibial spines. Enthesopathic change involving the superior pole of the patella. No joint effusion. Regional  soft tissues appear normal.  IMPRESSION: 1. 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 2. Chondrocalcinosis suggestive of CPPD.   Electronically Signed   By: Simonne Come M.D.   On: 12/29/2016 15:02  Results for orders placed or performed in visit on 12/29/16  BASIC METABOLIC PANEL WITH GFR  Result Value Ref Range   Glucose, Bld 95 65 - 139 mg/dL   BUN 29 (H) 7 - 25 mg/dL   Creat 1.61 (H) 0.96 - 1.18 mg/dL   GFR, Est Non African American 48 (L) > OR = 60 mL/min/1.35m2   GFR, Est African American 55 (L) > OR = 60 mL/min/1.16m2   BUN/Creatinine Ratio 20 6 - 22 (calc)   Sodium 140 135 - 146 mmol/L   Potassium 4.1 3.5 - 5.3 mmol/L   Chloride 103 98 - 110 mmol/L   CO2 27 20 - 32 mmol/L   Calcium 9.1 8.6 - 10.3 mg/dL  Uric acid  Result Value Ref Range   Uric Acid, Serum 12.3 (H) 4.0 - 8.0 mg/dL      Assessment & Plan:   Problem List Items Addressed This Visit    Acute renal failure superimposed on stage 3 chronic kidney disease (HCC)    Elevated Cr to 1.46 with likely AoCKD given prior Cr trend and history previously Possibly poor hydration, with darker concentrated urine and secondary to NSAIDs  Plan: 1. HOLD NSAIDS - Naproxen prophylaxis/acute gout therapy for now - likely will need to limit all use in future 2. Discontinue Lasix for 2-3 weeks - pending Cr improve may resume PRN 3. Improve hydration 4. Referral to Urology to evaluate potential BPH urinary retention affecting CKD and also concern with history of chronic recurrent nephrolithiasis (uric acid stones among other) may need further stone eval      Relevant Orders   Ambulatory referral to Urology   Bilateral chronic knee pain    Secondary to OA, CPPD, chronic gout See A&P      Relevant Medications   colchicine 0.6 MG tablet   BPH with obstruction/lower urinary tract symptoms    Presumed chronic BPH uncontrolled - AUA BPH score 15 today - Never  on alpha blocker or other therapy - only temporary in hospital previously - Last PSA 1.0 (10/2016) - No known personal/family history of prostate CA  Plan: 1. Discussed recommendation for alpha blocker therapy - however given constellation of urological complaints with potential newly dx BPH, darker urine and chronic history of nephrolithiasis agree with referral to Urology, requesting local referral to [facility] - Additionally agree to start alpha blocker in interim until can see Urologist, rx Flomax was not covered by ins per EHR, preferred option was Rapaflo, sent rx 4mg  daily to pharmacy      Relevant Medications   silodosin (RAPAFLO) 4 MG CAPS capsule   Other Relevant Orders   Ambulatory referral to Urology   Critical illness neuropathy (HCC)    Chronic problem s/p 09/2015 after acute critical post-op illness with septic shock,  acute resp failure, required prolonged ICU stay and intubation - Unclear if had some neuropathy prior to this with R foot drop - No longer followed by by Neurology ([facility]), no other clear reversible cause identified - Resume Gabapentin  daily by patient preference, limited relief on TID - Continue Tylenol, Muscle relax, avoid NSAIDs  Additionally today patient / wife requesting 2nd opinion local neurologist instead of returning to [place]. Request refer to [facility], would like to exhaust other testing and treatment options, then may consider the recent referral to PM&R for more functional/management      Relevant Orders   Ambulatory referral to Neurology   Gout - Primary    Clinically consistent with resolving acute on chronic gout L knee - Complicated by CPPD pseudogout as well, confirmed by history from wife and x-ray recently knees. Also reportedly confirmed crystal diagnosis in past  By rheum - History of hyperuricemia in past, including complication with uric acid nephrolithiasis - Improved in past on uric acid lowering therapy  Allopurinol, unsure prior dose at [facility] - Last uric acid level 12.3 (12/2016)  Plan: 1. Continue Allopurinol  daily prophylaxis - titrate up after 3-4 weeks to  daily then gradually up to  daily - Re-order colchicine 0.6mg  BID prophylaxis for now - can scale back to daily in future or only PRN after 3 months if tolerating uric acid lowering therapy - Discontinued Naproxen / NSAIDs due to AKI - May use prednisone in future if acute flare again or can use Colchicine inc dose for PRN use - Avoid food triggers (red meat, alcohol) 2. Follow-up within 4 weeks - check BMET and Uric Acid once more, adjust meds, future may need return to Rheumatologist  Additionally pending resolution of AKI, can consider resume ARB therapy (he was on ACEi in past with angioedema) but Losartan would lower risk of future gout flares      Relevant Medications   colchicine 0.6 MG tablet   Hyperlipidemia    Resume Atorvastatin, he had stopped since old rx from Amy Krebs FNP prior PCP ran out did not request refill - Denies myalgias      Relevant Medications   atorvastatin (LIPITOR) 20 MG tablet   Pseudogout of knee    Chronic problem of L knee, and other joints, confirmed with previous reported dx by fluid studies, [facility] - X-ray 12/29/16 suggestive of CPPD per radiology  Plan: 1. Continue with gout management - start uric acid lowering therapy Allopurinol  dailiy and titrate up q 3-4 weeks by 100 to goal  daily and in addition on colchicine prophylaxis 0.6mg  BID for up to 3 months - cover for both gout/pseudogout, (did not start med since last week, unsure why not available at pharmacy, resend today) 2. STOP Naproxen - avoid NSAID AKI 3. Future may consider return to Rheumatology if needed      Relevant Medications   colchicine 0.6 MG tablet    Other Visit Diagnoses    Hypokalemia       Relevant Medications   potassium chloride 20 MEQ TBCR   History of  nephrolithiasis       Relevant Orders   Ambulatory referral to Urology      Meds ordered this encounter  Medications  . DISCONTD: naproxen (NAPROSYN) 250 MG tablet  . atorvastatin (LIPITOR) 20 MG tablet    Sig: Take 1 tablet (20 mg total) by mouth daily at 6 PM.    Dispense:  30  tablet    Refill:  11  . colchicine 0.6 MG tablet    Sig: Take 1 tablet (0.6 mg total) by mouth 2 (two) times daily. For up to 3 months for gout prevention while starting Allopurinol    Dispense:  60 tablet    Refill:  2  . potassium chloride 20 MEQ TBCR    Sig: Take 20 mEq by mouth daily.    Dispense:  30 tablet    Refill:  11  . silodosin (RAPAFLO) 4 MG CAPS capsule    Sig: Take 1 capsule (4 mg total) by mouth daily with breakfast.    Dispense:  30 capsule    Refill:  3   Follow up plan: Return in about 4 weeks (around 02/01/2017) for Gout, labs.  [name], DO [facility] 01/05/2017, 1:08 AM

## 2017-01-05 NOTE — Assessment & Plan Note (Signed)
Presumed chronic BPH uncontrolled - AUA BPH score 15 today - Never on alpha blocker or other therapy - only temporary in hospital previously - Last PSA 1.0 (10/2016) - No known personal/family history of prostate CA  Plan: 1. Discussed recommendation for alpha blocker therapy - however given constellation of urological complaints with potential newly dx BPH, darker urine and chronic history of nephrolithiasis agree with referral to Urology, requesting local referral to [facility] - Additionally agree to start alpha blocker in interim until can see Urologist, rx Flomax was not covered by ins per EHR, preferred option was Rapaflo, sent rx 4mg  daily to pharmacy

## 2017-01-05 NOTE — Assessment & Plan Note (Addendum)
Elevated Cr to 1.46 with likely AoCKD given prior Cr trend and history previously Possibly poor hydration, with darker concentrated urine and secondary to NSAIDs  Plan: 1. HOLD NSAIDS - Naproxen prophylaxis/acute gout therapy for now - likely will need to limit all use in future 2. Discontinue Lasix for 2-3 weeks - pending Cr improve may resume PRN 3. Improve hydration 4. Referral to Urology to evaluate potential BPH urinary retention affecting CKD and also concern with history of chronic recurrent nephrolithiasis (uric acid stones among other) may need further stone eval

## 2017-01-05 NOTE — Assessment & Plan Note (Signed)
Secondary to OA, CPPD, chronic gout See A&P

## 2017-01-05 NOTE — Assessment & Plan Note (Addendum)
Chronic problem of L knee, and other joints, confirmed with previous reported dx by fluid studies, [facility] - X-ray 12/29/16 suggestive of CPPD per radiology  Plan: 1. Continue with gout management - start uric acid lowering therapy Allopurinol 100mg  dailiy and titrate up q 3-4 weeks by 100 to goal 300mg  daily and in addition on colchicine prophylaxis 0.6mg  BID for up to 3 months - cover for both gout/pseudogout, (did not start med since last week, unsure why not available at pharmacy, resend today) 2. STOP Naproxen - avoid NSAID AKI 3. Future may consider return to Rheumatology if needed

## 2017-01-05 NOTE — Assessment & Plan Note (Signed)
Resume Atorvastatin, he had stopped since old rx from Bjorn PippinAmy Krebs FNP prior PCP ran out did not request refill - Denies myalgias

## 2017-01-05 NOTE — Assessment & Plan Note (Signed)
Chronic problem s/p 09/2015 after acute critical post-op illness with septic shock, acute resp failure, required prolonged ICU stay and intubation - Unclear if had some neuropathy prior to this with R foot drop - No longer followed by by Neurology ([facility]), no other clear reversible cause identified - Resume Gabapentin 300mg  daily by patient preference, limited relief on TID - Continue Tylenol, Muscle relax, avoid NSAIDs  Additionally today patient / wife requesting 2nd opinion local neurologist instead of [place]. [facility], would like to exhaust other testing and treatment options, then may consider the recent referral to PM&R for more functional/management

## 2017-01-05 NOTE — Assessment & Plan Note (Addendum)
Clinically consistent with resolving acute on chronic gout L knee - Complicated by CPPD pseudogout as well, confirmed by history from wife and x-ray recently knees. Also reportedly confirmed crystal diagnosis in past  By rheum - History of hyperuricemia in past, including complication with uric acid nephrolithiasis - Improved in past on uric acid lowering therapy [facility] - Last uric acid level 12.3 (12/2016)  Plan: 1. Continue Allopurinol 100mg  daily prophylaxis - titrate up after 3-4 weeks to 200mg  daily then gradually up to 300mg  daily - Re-order colchicine 0.6mg  BID prophylaxis for now - can scale back to daily in future or only PRN after 3 months if tolerating uric acid lowering therapy - Discontinued Naproxen / NSAIDs due to AKI - May use prednisone in future if acute flare again or can use Colchicine inc dose for PRN use - Avoid food triggers (red meat, alcohol) 2. Follow-up within 4 weeks - check BMET and Uric Acid once more, adjust meds, future may need return to Rheumatologist  Additionally pending resolution of AKI, can consider resume ARB therapy (he was on ACEi in past with angioedema) but Losartan would lower risk of future gout flares

## 2017-01-07 ENCOUNTER — Other Ambulatory Visit: Payer: Self-pay | Admitting: Family Medicine

## 2017-01-07 DIAGNOSIS — N138 Other obstructive and reflux uropathy: Secondary | ICD-10-CM

## 2017-01-07 DIAGNOSIS — N401 Enlarged prostate with lower urinary tract symptoms: Principal | ICD-10-CM

## 2017-01-07 MED ORDER — TAMSULOSIN HCL 0.4 MG PO CAPS: 0.4000 mg | ORAL_CAPSULE | Freq: Every day | ORAL | 5 refills | Status: DC

## 2017-01-25 ENCOUNTER — Ambulatory Visit: Payer: PPO | Admitting: Urology

## 2017-01-25 ENCOUNTER — Encounter: Payer: Self-pay | Admitting: Urology

## 2017-01-25 VITALS — BP 169/83 | HR 76 | Ht 70.0 in | Wt 251.0 lb

## 2017-01-25 DIAGNOSIS — N138 Other obstructive and reflux uropathy: Secondary | ICD-10-CM

## 2017-01-25 DIAGNOSIS — N401 Enlarged prostate with lower urinary tract symptoms: Secondary | ICD-10-CM

## 2017-01-25 DIAGNOSIS — Z87442 Personal history of urinary calculi: Secondary | ICD-10-CM

## 2017-01-25 NOTE — Progress Notes (Signed)
01/25/2017 3:15 PM   Danny Patterson Jul 29, 1945 [id number]  Referring provider: Smitty CordsKaramalegos, Alexander J, DO [address] [place], [name] [id number]  Chief Complaint  Patient presents with  . New Patient (Initial Visit)    HPI: 71 year-old male recently seen by Dr. Althea CharonKaramalegos was found to have a bump in his creatinine from 1.19-1.46.  He has a history of BPH on tamsulosin and a history of uric acid stones.  He was placed on Flomax after developing voiding difficulty status post lumbar spine surgery in July 2017.  He has seen Dr. Reuel Boomaniel several years ago for uric acid stones and was apparently seen by urology 5-8 years ago and was told he had kidney stones.  He presently denies flank, abdominal, pelvic or scrotal pain.  He denies gross hematuria.  He has urinary frequency and intermittency.  He is on potassium chloride, colchicine and allopurinol.    PMH: Past Medical History:  Diagnosis Date  . Allergy   . Arthritis   . Diabetes mellitus without complication (HCC)   . Gout   . Hyperlipidemia   . Hyperlipidemia   . Hypertension   . Kidney stone   . Rheumatic fever     Surgical History: Past Surgical History:  Procedure Laterality Date  . BACK SURGERY    . knot     removed from neck    Home Medications:  Allergies as of 01/25/2017      Reactions   Ace Inhibitors Other (See Comments)   Angioedema.      Medication List        Accurate as of 01/25/17  3:15 PM. Always use your most recent med list.          acetaminophen 500 MG tablet Commonly known as:  TYLENOL Take 2 tablets (1,000 mg total) by mouth every 8 (eight) hours as needed.   allopurinol 100 MG tablet Commonly known as:  ZYLOPRIM Take 1 tablet (100 mg total) by mouth daily.   amLODipine 5 MG tablet Commonly known as:  NORVASC Take 1 tablet (5 mg total) by mouth daily. Take 1 tablet daily   atorvastatin 20 MG tablet Commonly known as:  LIPITOR Take 1 tablet (20 mg total) by mouth daily at 6 PM.     baclofen 10 MG tablet Commonly known as:  LIORESAL Take 0.5-1 tablets (5-10 mg total) by mouth 3 (three) times daily as needed for muscle spasms.   cholecalciferol 1000 units tablet Commonly known as:  VITAMIN D Take 1,000 Units daily by mouth.   colchicine 0.6 MG tablet Take 1 tablet (0.6 mg total) by mouth 2 (two) times daily. For up to 3 months for gout prevention while starting Allopurinol   diclofenac sodium 1 % Gel Commonly known as:  VOLTAREN Apply 2 g topically 3 (three) times daily. As needed for hands and knee, arthritis.   furosemide 20 MG tablet Commonly known as:  LASIX Take 2 tablets each AM for swelling   gabapentin 300 MG capsule Commonly known as:  NEURONTIN Take 300 mg by mouth daily.   magnesium gluconate 500 MG tablet Commonly known as:  MAGONATE Take 500 mg by mouth daily.   Potassium Chloride ER 20 MEQ Tbcr Take 20 mEq by mouth daily.   tamsulosin 0.4 MG Caps capsule Commonly known as:  FLOMAX Take 1 capsule (0.4 mg total) by mouth daily.       Allergies:  Allergies  Allergen Reactions  . Ace Inhibitors Other (See Comments)  Angioedema.    Family History: Family History  Problem Relation Age of Onset  . Parkinson's disease Father [id number]  . Kidney disease Maternal Grandmother   . Hypertension Sister   . Kidney cancer Neg Hx   . Prostate cancer Neg Hx     Social History:  reports that he has quit smoking. His smoking use included cigarettes. he has never used smokeless tobacco. He reports that he drinks about 1.8 oz of alcohol per week. He reports that he does not use drugs.  ROS: UROLOGY Frequent Urination?: Yes Hard to postpone urination?: No Burning/pain with urination?: No Get up at night to urinate?: No Leakage of urine?: No Urine stream starts and stops?: Yes Trouble starting stream?: Yes Do you have to strain to urinate?: No Blood in urine?: No Urinary tract infection?: No Sexually transmitted disease?: No Injury to  kidneys or bladder?: No Painful intercourse?: No Weak stream?: No Erection problems?: Yes Penile pain?: No  Gastrointestinal Nausea?: No Vomiting?: No Indigestion/heartburn?: No Diarrhea?: No Constipation?: No  Constitutional Fever: No Night sweats?: No Weight loss?: No Fatigue?: Yes  Skin Skin rash/lesions?: No Itching?: No  Eyes Blurred vision?: No Double vision?: No  Ears/Nose/Throat Sore throat?: No Sinus problems?: No  Hematologic/Lymphatic Swollen glands?: No Easy bruising?: No  Cardiovascular Leg swelling?: Yes Chest pain?: No  Respiratory Cough?: No Shortness of breath?: No  Endocrine Excessive thirst?: No  Musculoskeletal Back pain?: Yes Joint pain?: Yes  Neurological Headaches?: No Dizziness?: Yes  Psychologic Depression?: No Anxiety?: No  Physical Exam: BP (!) 169/83   Pulse 76   Ht 5\' 10"  (1.778 m)   Wt 251 lb (113.9 kg)   BMI 36.01 kg/m   Constitutional:  Alert and oriented, No acute distress. HEENT: [place] AT, moist mucus membranes.  Trachea midline, no masses. Cardiovascular: No clubbing, cyanosis, or edema. Respiratory: Normal respiratory effort, no increased work of breathing. GI: Abdomen is soft, nontender, nondistended, no abdominal masses GU: No CVA tenderness.  Penis uncircumcised without lesions testes descended bilaterally, slightly atrophic without masses or tenderness.  Cord/epididymes palpably normal.  Prostate 35 g, smooth without nodules. Skin: No rashes, bruises or suspicious lesions. Lymph: No cervical or inguinal adenopathy. Neurologic: Grossly intact, no focal deficits, moving all 4 extremities. Psychiatric: Normal mood and affect.  Laboratory Data: Lab Results  Component Value Date   WBC 6.7 11/08/2016   HGB 13.4 11/08/2016   HCT 41.0 11/08/2016   MCV 94.7 11/08/2016   PLT 189 11/08/2016    Lab Results  Component Value Date   CREATININE 1.46 (H) 12/29/2016      Assessment & Plan:    1. BPH with  obstruction/lower urinary tract symptoms Stable lower urinary tract symptoms on tamsulosin.  A renal ultrasound is scheduled to include a postvoid residual.  - Ultrasound renal complete; Future  2. Personal history of kidney stones Renal ultrasound as above.  - Ultrasound renal complete; Future    Riki AltesScott C Braidon Chermak, MD  [facility] [address], [address] [place], [name] [address] [phone]

## 2017-01-26 ENCOUNTER — Other Ambulatory Visit: Payer: PPO

## 2017-01-26 DIAGNOSIS — E790 Hyperuricemia without signs of inflammatory arthritis and tophaceous disease: Secondary | ICD-10-CM | POA: Diagnosis not present

## 2017-01-26 DIAGNOSIS — N179 Acute kidney failure, unspecified: Secondary | ICD-10-CM | POA: Diagnosis not present

## 2017-01-26 DIAGNOSIS — M1A0790 Idiopathic chronic gout, unspecified ankle and foot, without tophus (tophi): Secondary | ICD-10-CM | POA: Diagnosis not present

## 2017-01-26 DIAGNOSIS — M11262 Other chondrocalcinosis, left knee: Secondary | ICD-10-CM

## 2017-01-26 LAB — BASIC METABOLIC PANEL WITH GFR
BUN: 18 mg/dL (ref 7–25)
CALCIUM: 9.2 mg/dL (ref 8.6–10.3)
CO2: 30 mmol/L (ref 20–32)
CREATININE: 1.11 mg/dL (ref 0.70–1.18)
Chloride: 104 mmol/L (ref 98–110)
GFR, EST NON AFRICAN AMERICAN: 66 mL/min/{1.73_m2} (ref >=60)
GFR, Est African American: 77 mL/min/{1.73_m2} (ref >=60)
Glucose, Bld: 96 mg/dL (ref 65–139)
Potassium: 4.2 mmol/L (ref 3.5–5.3)
SODIUM: 142 mmol/L (ref 135–146)

## 2017-01-26 LAB — URIC ACID: Uric Acid, Serum: 7.9 mg/dL (ref 4.0–8.0)

## 2017-02-01 ENCOUNTER — Encounter: Payer: PPO | Admitting: Family Medicine

## 2017-02-04 ENCOUNTER — Ambulatory Visit
Admission: RE | Admit: 2017-02-04 | Discharge: 2017-02-04 | Disposition: A | Discharge status: Home / Self Care | Payer: PPO | Source: Ambulatory Visit | Attending: Urology | Admitting: Urology

## 2017-02-04 DIAGNOSIS — N200 Calculus of kidney: Secondary | ICD-10-CM | POA: Diagnosis not present

## 2017-02-04 DIAGNOSIS — Z87442 Personal history of urinary calculi: Secondary | ICD-10-CM

## 2017-02-04 DIAGNOSIS — N401 Enlarged prostate with lower urinary tract symptoms: Secondary | ICD-10-CM

## 2017-02-04 DIAGNOSIS — N138 Other obstructive and reflux uropathy: Secondary | ICD-10-CM | POA: Insufficient documentation

## 2017-02-07 ENCOUNTER — Telehealth: Payer: Self-pay | Admitting: Urology

## 2017-02-07 NOTE — Telephone Encounter (Signed)
Please notify the patient about this   Thanks, Marcelino DusterMichelle

## 2017-02-07 NOTE — Telephone Encounter (Signed)
-----   Message from Riki AltesScott C Stoioff, MD sent at 02/07/2017  7:57 AM EST ----- Renal ultrasound showed bilateral, nonobstructing renal calculi.  There were no abnormalities identified that would be responsible for the rise in his creatinine.  Would recommend a 24-hour urine study through litho-link.

## 2017-02-08 NOTE — Telephone Encounter (Signed)
LMOM

## 2017-02-09 NOTE — Telephone Encounter (Signed)
LMOM- will send a letter.  

## 2017-02-15 DIAGNOSIS — R278 Other lack of coordination: Secondary | ICD-10-CM | POA: Insufficient documentation

## 2017-02-15 DIAGNOSIS — R202 Paresthesia of skin: Secondary | ICD-10-CM | POA: Diagnosis not present

## 2017-02-15 DIAGNOSIS — R200 Anesthesia of skin: Secondary | ICD-10-CM | POA: Insufficient documentation

## 2017-02-23 ENCOUNTER — Other Ambulatory Visit: Payer: Self-pay | Admitting: Family Medicine

## 2017-03-09 DIAGNOSIS — G629 Polyneuropathy, unspecified: Secondary | ICD-10-CM | POA: Diagnosis not present

## 2017-03-16 DIAGNOSIS — G629 Polyneuropathy, unspecified: Secondary | ICD-10-CM | POA: Insufficient documentation

## 2017-04-25 ENCOUNTER — Other Ambulatory Visit: Payer: PPO

## 2017-04-25 DIAGNOSIS — N200 Calculus of kidney: Secondary | ICD-10-CM | POA: Diagnosis not present

## 2017-06-14 ENCOUNTER — Other Ambulatory Visit: Payer: Self-pay | Admitting: Urology

## 2017-06-17 ENCOUNTER — Other Ambulatory Visit: Payer: Self-pay | Admitting: Family Medicine

## 2017-06-22 ENCOUNTER — Telehealth: Payer: Self-pay

## 2017-06-22 NOTE — Telephone Encounter (Signed)
-----   Message from Riki AltesScott C Stoioff, MD sent at 06/21/2017  3:54 PM EDT ----- 24-hour urine study did show a low urine pH which is a significant factor and uric acid stone formation.  He also had low urinary citrate levels.  He has a follow-up scheduled on 4/11 and will discuss treatment options at that time.

## 2017-06-22 NOTE — Telephone Encounter (Signed)
Letter sent.

## 2017-06-30 ENCOUNTER — Encounter: Payer: Self-pay | Admitting: Urology

## 2017-06-30 ENCOUNTER — Ambulatory Visit: Payer: PPO | Admitting: Urology

## 2017-06-30 VITALS — BP 171/81 | HR 75 | Resp 16 | Ht 68.0 in | Wt 254.4 lb

## 2017-06-30 DIAGNOSIS — R82991 Hypocitraturia: Secondary | ICD-10-CM | POA: Diagnosis not present

## 2017-06-30 DIAGNOSIS — N200 Calculus of kidney: Secondary | ICD-10-CM | POA: Diagnosis not present

## 2017-06-30 NOTE — Progress Notes (Signed)
06/30/2017 3:40 PM   Rupert StacksGary L Blocher 16-Mar-1946 [id number]  Referring provider: Smitty CordsKaramalegos, Alexander J, DO [address] [place], [name] [id number]  Chief Complaint  Patient presents with  . Follow-up    HPI: 72 year old male presents for follow-up.  He was seen in November 2018 for a bump in his creatinine from 1.11-1.46.  He had a history of uric acid stone disease.  A renal ultrasound was performed which showed no hydronephrosis however there was an 11 mm right midpole renal calculus and a 4 mm left lower pole calculus.  A 24 urine study was recommended and he presents today for review.  His urine volume was excellent at 2.5 L.  Urine calcium and oxalate levels were normal.  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  His urine uric acid level was within normal limits.  Serum uric acid was 7.9 but decreased from 12.2 with treatment by his PCP.  He denies flank or abdominal pain.  He denies gross hematuria.   PMH: Past Medical History:  Diagnosis Date  . Allergy   . Arthritis   . Diabetes mellitus without complication (HCC)   . Gout   . Hyperlipidemia   . Hyperlipidemia   . Hypertension   . Kidney stone   . Rheumatic fever     Surgical History: Past Surgical History:  Procedure Laterality Date  . BACK SURGERY    . knot     removed from neck  . POSTERIOR LUMBAR FUSION 4 LEVEL N/A 10/01/2015   Procedure: Lumbar three-four,  Lumbar four-five,  Lumbar five-Sacrum one posterior lumbar interbody fusion with Laminotomy at Lumbar Two-Three;  Surgeon: Hilda LiasErnesto Botero, MD;  Location: [facility];  Service: Neurosurgery;  Laterality: N/A;    Home Medications:  Allergies as of 06/30/2017      Reactions   Ace Inhibitors Other (See Comments)   Angioedema.      Medication List        Accurate as of 06/30/17  3:40 PM. Always use your most recent med list.          acetaminophen 500 MG tablet Commonly known as:  TYLENOL Take 2 tablets (1,000 mg total) by mouth every  8 (eight) hours as needed.   allopurinol 100 MG tablet Commonly known as:  ZYLOPRIM Take 1 tablet (100 mg total) by mouth daily.   amLODipine 5 MG tablet Commonly known as:  NORVASC Take 1 tablet (5 mg total) by mouth daily. Take 1 tablet daily   atorvastatin 20 MG tablet Commonly known as:  LIPITOR Take 1 tablet (20 mg total) by mouth daily at 6 PM.   baclofen 10 MG tablet Commonly known as:  LIORESAL TAKE 0.5-1 TABLETS (5-10 MG TOTAL) BY MOUTH 3 (THREE) TIMES DAILY AS NEEDED FOR MUSCLE SPASMS.   cholecalciferol 1000 units tablet Commonly known as:  VITAMIN D Take 1,000 Units daily by mouth.   colchicine 0.6 MG tablet Take 1 tablet (0.6 mg total) by mouth 2 (two) times daily. For up to 3 months for gout prevention while starting Allopurinol   diclofenac sodium 1 % Gel Commonly known as:  VOLTAREN Apply 2 g topically 3 (three) times daily. As needed for hands and knee, arthritis.   furosemide 20 MG tablet Commonly known as:  LASIX Take 2 tablets each AM for swelling   gabapentin 300 MG capsule Commonly known as:  NEURONTIN Take 300 mg by mouth daily.   magnesium gluconate 500 MG tablet  Commonly known as:  MAGONATE Take 500 mg by mouth daily.   Potassium Chloride ER 20 MEQ Tbcr Take 20 mEq by mouth daily.   tamsulosin 0.4 MG Caps capsule Commonly known as:  FLOMAX Take 1 capsule (0.4 mg total) by mouth daily.       Allergies:  Allergies  Allergen Reactions  . Ace Inhibitors Other (See Comments)    Angioedema.    Family History: Family History  Problem Relation Age of Onset  . Parkinson's disease Father 48  . Kidney disease Maternal Grandmother   . Hypertension Sister   . Kidney cancer Neg Hx   . Prostate cancer Neg Hx     Social History:  reports that he has quit smoking. His smoking use included cigarettes. He has never used smokeless tobacco. He reports that he drinks about 1.8 oz of alcohol per week. He reports that he does not use  drugs.  ROS: UROLOGY Frequent Urination?: No Hard to postpone urination?: No Burning/pain with urination?: No Get up at night to urinate?: No Leakage of urine?: No Urine stream starts and stops?: No Trouble starting stream?: No Do you have to strain to urinate?: No Blood in urine?: No Urinary tract infection?: No Sexually transmitted disease?: No Injury to kidneys or bladder?: No Painful intercourse?: No Weak stream?: No Erection problems?: No Penile pain?: No  Gastrointestinal Nausea?: No Vomiting?: No Indigestion/heartburn?: No Diarrhea?: No Constipation?: No  Constitutional Fever: No Night sweats?: No Weight loss?: No Fatigue?: No  Skin Skin rash/lesions?: No Itching?: No  Eyes Blurred vision?: No Double vision?: No  Ears/Nose/Throat Sore throat?: No Sinus problems?: No  Hematologic/Lymphatic Swollen glands?: No Easy bruising?: No  Cardiovascular Leg swelling?: No Chest pain?: No  Respiratory Cough?: No Shortness of breath?: No  Endocrine Excessive thirst?: No  Musculoskeletal Back pain?: No Joint pain?: No  Neurological Headaches?: No Dizziness?: No  Psychologic Depression?: No Anxiety?: No  Physical Exam: BP (!) 171/81   Pulse 75   Resp 16   Ht 5\' 8"  (1.727 m)   Wt 254 lb 6.4 oz (115.4 kg)   SpO2 98%   BMI 38.68 kg/m   Constitutional:  Alert and oriented, No acute distress. HEENT: [place] AT, moist mucus membranes.  Trachea midline, no masses. Cardiovascular: No clubbing, cyanosis, or edema. Respiratory: Normal respiratory effort, no increased work of breathing. Lymph: No cervical or inguinal lymphadenopathy. Skin: No rashes, bruises or suspicious lesions. Neurologic: Grossly intact, no focal deficits, moving all 4 extremities. Psychiatric: Normal mood and affect.  Laboratory Data: Lab Results  Component Value Date   WBC 6.7 11/08/2016   HGB 13.4 11/08/2016   HCT 41.0 11/08/2016   MCV 94.7 11/08/2016   PLT 189  11/08/2016    Lab Results  Component Value Date   CREATININE 1.11 01/26/2017    Lab Results  Component Value Date   HGBA1C 5.5 11/08/2016     Assessment & Plan:   72 year old male with nephrolithiasis and hypocitraturia/low urine pH with a history of uric acid stones.  I recommended starting potassium citrate.  He may discontinue the potassium chloride.  Follow-up repeat 24 urine study and renal ultrasound in 3-4 months.  Return in about 4 months (around 10/30/2017) for Recheck, rus.   Riki Altes, MD  [facility] [address] [place], [place] [address] [phone]

## 2017-08-12 ENCOUNTER — Other Ambulatory Visit: Payer: Self-pay | Admitting: Family Medicine

## 2017-08-12 DIAGNOSIS — I10 Essential (primary) hypertension: Secondary | ICD-10-CM

## 2017-08-19 ENCOUNTER — Other Ambulatory Visit: Payer: Self-pay | Admitting: Family Medicine

## 2017-08-19 DIAGNOSIS — R600 Localized edema: Secondary | ICD-10-CM

## 2017-08-31 ENCOUNTER — Other Ambulatory Visit: Payer: Self-pay | Admitting: Family Medicine

## 2017-08-31 DIAGNOSIS — E790 Hyperuricemia without signs of inflammatory arthritis and tophaceous disease: Secondary | ICD-10-CM

## 2017-08-31 DIAGNOSIS — M1A0790 Idiopathic chronic gout, unspecified ankle and foot, without tophus (tophi): Secondary | ICD-10-CM

## 2017-10-27 ENCOUNTER — Telehealth: Payer: Self-pay | Admitting: Urology

## 2017-10-27 DIAGNOSIS — N200 Calculus of kidney: Secondary | ICD-10-CM

## 2017-10-27 NOTE — Telephone Encounter (Signed)
Order was entered 

## 2017-10-27 NOTE — Telephone Encounter (Signed)
You wanted a RUS on pt before appt.  There isn't an order in and pt appt is 10/31/17.  We will need to resch appt until pt gets RUS.  Can you please put order in?

## 2017-10-31 ENCOUNTER — Ambulatory Visit: Payer: PPO | Admitting: Urology

## 2017-11-09 ENCOUNTER — Ambulatory Visit
Admission: RE | Admit: 2017-11-09 | Discharge: 2017-11-09 | Disposition: A | Discharge status: Home / Self Care | Payer: PPO | Source: Ambulatory Visit | Attending: Urology | Admitting: Urology

## 2017-11-09 DIAGNOSIS — N200 Calculus of kidney: Secondary | ICD-10-CM | POA: Diagnosis not present

## 2017-11-28 ENCOUNTER — Ambulatory Visit: Payer: PPO | Admitting: Urology

## 2017-12-21 ENCOUNTER — Other Ambulatory Visit: Payer: Self-pay | Admitting: Family Medicine

## 2017-12-21 DIAGNOSIS — R600 Localized edema: Secondary | ICD-10-CM

## 2017-12-24 ENCOUNTER — Other Ambulatory Visit: Payer: Self-pay | Admitting: Family Medicine

## 2017-12-24 DIAGNOSIS — M1000 Idiopathic gout, unspecified site: Secondary | ICD-10-CM

## 2017-12-24 DIAGNOSIS — M1A0790 Idiopathic chronic gout, unspecified ankle and foot, without tophus (tophi): Secondary | ICD-10-CM

## 2017-12-25 ENCOUNTER — Other Ambulatory Visit: Payer: Self-pay | Admitting: Family Medicine

## 2017-12-25 DIAGNOSIS — M1A0790 Idiopathic chronic gout, unspecified ankle and foot, without tophus (tophi): Secondary | ICD-10-CM

## 2017-12-25 DIAGNOSIS — E790 Hyperuricemia without signs of inflammatory arthritis and tophaceous disease: Secondary | ICD-10-CM

## 2018-01-09 ENCOUNTER — Encounter: Payer: Self-pay | Admitting: Urology

## 2018-01-09 ENCOUNTER — Ambulatory Visit (INDEPENDENT_AMBULATORY_CARE_PROVIDER_SITE_OTHER): Payer: PPO | Admitting: Urology

## 2018-01-09 VITALS — BP 164/80 | HR 66 | Ht 68.0 in | Wt 246.6 lb

## 2018-01-09 DIAGNOSIS — N200 Calculus of kidney: Secondary | ICD-10-CM

## 2018-01-09 NOTE — Progress Notes (Signed)
01/09/2018 12:46 PM   Rupert Stacks [birth year] [id number]  Referring provider: Smitty Cords, DO [address] [place], [place] [address]  Chief Complaint  Patient presents with  . Nephrolithiasis    HPI: 72 year old male with uric acid nephrolithiasis presents for follow-up.  He was last seen April 2019 and renal ultrasound at that time showed an 11 mm right midpole calculus and a 4 mm left lower pole calculus.  24 urine study showed good urine volume.  Urine uric acid level was normal and pH was low at 5.65.  He was started on potassium citrate which he has tolerated well.  Follow-up renal ultrasound performed August 2019 showed resolution of his right renal calculus.  The left renal calculus was measured at 9 mm.  No hydronephrosis was noted.   PMH: Past Medical History:  Diagnosis Date  . Allergy   . Arthritis   . Diabetes mellitus without complication (HCC)   . Gout   . Hyperlipidemia   . Hyperlipidemia   . Hypertension   . Kidney stone   . Rheumatic fever     Surgical History: Past Surgical History:  Procedure Laterality Date  . BACK SURGERY    . knot     removed from neck  . POSTERIOR LUMBAR FUSION 4 LEVEL N/A 10/01/2015   Procedure: Lumbar three-four,  Lumbar four-five,  Lumbar five-Sacrum one posterior lumbar interbody fusion with Laminotomy at Lumbar Two-Three;  Surgeon: Hilda Lias, MD;  Location: [facility];  Service: Neurosurgery;  Laterality: N/A;    Home Medications:  Allergies as of 01/09/2018      Reactions   Ace Inhibitors Other (See Comments)   Angioedema.      Medication List        Accurate as of 01/09/18 12:46 PM. Always use your most recent med list.          acetaminophen 500 MG tablet Commonly known as:  TYLENOL Take 2 tablets (1,000 mg total) by mouth every 8 (eight) hours as needed.   allopurinol 100 MG tablet Commonly known as:  ZYLOPRIM TAKE 1 TABLET BY MOUTH EVERY DAY   amLODipine 5 MG tablet Commonly known  as:  NORVASC TAKE 1 TABLET BY MOUTH DAILY   atorvastatin 20 MG tablet Commonly known as:  LIPITOR Take 1 tablet (20 mg total) by mouth daily at 6 PM.   baclofen 10 MG tablet Commonly known as:  LIORESAL TAKE 0.5-1 TABLETS (5-10 MG TOTAL) BY MOUTH 3 (THREE) TIMES DAILY AS NEEDED FOR MUSCLE SPASMS.   cholecalciferol 1000 units tablet Commonly known as:  VITAMIN D Take 1,000 Units daily by mouth.   colchicine 0.6 MG tablet Take 1 tablet (0.6 mg total) by mouth 2 (two) times daily. For up to 3 months for gout prevention while starting Allopurinol   furosemide 20 MG tablet Commonly known as:  LASIX TAKE 2 TABLETS EACH MORNING FOR SWELLING   gabapentin 300 MG capsule Commonly known as:  NEURONTIN Take 300 mg by mouth daily.   magnesium gluconate 500 MG tablet Commonly known as:  MAGONATE Take 500 mg by mouth daily.   naproxen 250 MG tablet Commonly known as:  NAPROSYN 1 TAB BY MOUTH TWICE A DAY W/ MEAL FOR GOUT PREVENTION. FOR FLARE, MAY TAKE 2 TWICE DAILY X1 WEEK   tamsulosin 0.4 MG Caps capsule Commonly known as:  FLOMAX Take 1 capsule (0.4 mg total) by mouth daily.       Allergies:  Allergies  Allergen Reactions  .  Ace Inhibitors Other (See Comments)    Angioedema.    Family History: Family History  Problem Relation Age of Onset  . Parkinson's disease Father 7  . Kidney disease Maternal Grandmother   . Hypertension Sister   . Kidney cancer Neg Hx   . Prostate cancer Neg Hx     Social History:  reports that he has quit smoking. His smoking use included cigarettes. He has never used smokeless tobacco. He reports that he drinks about 3.0 standard drinks of alcohol per week. He reports that he does not use drugs.  ROS: UROLOGY Frequent Urination?: No Hard to postpone urination?: No Burning/pain with urination?: No Get up at night to urinate?: No Leakage of urine?: No Urine stream starts and stops?: No Trouble starting stream?: No Do you have to strain to  urinate?: No Blood in urine?: No Urinary tract infection?: No Sexually transmitted disease?: No Injury to kidneys or bladder?: No Painful intercourse?: No Weak stream?: No Erection problems?: No Penile pain?: No  Gastrointestinal Nausea?: No Vomiting?: No Indigestion/heartburn?: No Diarrhea?: No Constipation?: No  Constitutional Fever: No Night sweats?: No Weight loss?: No Fatigue?: No  Skin Skin rash/lesions?: No Itching?: No  Eyes Blurred vision?: No Double vision?: No  Ears/Nose/Throat Sore throat?: No Sinus problems?: No  Hematologic/Lymphatic Swollen glands?: No Easy bruising?: No  Cardiovascular Leg swelling?: No Chest pain?: No  Respiratory Cough?: No Shortness of breath?: No  Endocrine Excessive thirst?: No  Musculoskeletal Back pain?: No Joint pain?: No  Neurological Headaches?: No Dizziness?: No  Psychologic Depression?: No Anxiety?: No  Physical Exam: BP (!) 164/80 (BP Location: Left Arm, Patient Position: Sitting, Cuff Size: Large)   Pulse 66   Ht 5\' 8"  (1.727 m)   Wt 246 lb 9.6 oz (111.9 kg)   BMI 37.50 kg/m   Constitutional:  Alert, No acute distress. HEENT: [place] AT, moist mucus membranes.  Trachea midline, no masses. Cardiovascular: No clubbing, cyanosis, or edema. Respiratory: Normal respiratory effort, no increased work of breathing. GI: Abdomen is soft, nontender, nondistended, no abdominal masses GU: No CVA tenderness Lymph: No cervical or inguinal lymphadenopathy. Skin: No rashes, bruises or suspicious lesions. Neurologic: Grossly intact, no focal deficits, moving all 4 extremities. Psychiatric: Normal mood and affect.   Assessment & Plan:   73 year old male with uric acid nephrolithiasis.  The right renal calculus has resolved with urinary alkalinization.  He was however noted to have a 9 mm calculus on the contralateral side.  I recommended a repeat 24-hour urine study.  He will be notified with the results and if  stable follow-up 1 year with a KUB to see if the calculus can be identified on plain x-ray   Return in about 1 year (around 01/10/2019) for Recheck, KUB.  Riki Altes, MD  [facility] [address] [place], [place] [address] [phone]

## 2018-02-11 ENCOUNTER — Other Ambulatory Visit: Payer: Self-pay | Admitting: Family Medicine

## 2018-02-11 DIAGNOSIS — E782 Mixed hyperlipidemia: Secondary | ICD-10-CM

## 2018-03-11 ENCOUNTER — Other Ambulatory Visit: Payer: Self-pay | Admitting: Family Medicine

## 2018-03-11 DIAGNOSIS — M159 Polyosteoarthritis, unspecified: Secondary | ICD-10-CM

## 2018-03-11 DIAGNOSIS — M150 Primary generalized (osteo)arthritis: Principal | ICD-10-CM

## 2018-03-11 IMAGING — CT CT ANGIO CHEST
1 of 7 series · 17 of 36 positions shown · IV contrast (Iohexol (Omnipaque 350))
Comparison: None available (03/25/1999)

CLINICAL DATA: Hypoxia and hypotension.

EXAM:
CT ANGIOGRAPHY CHEST WITH CONTRAST
TECHNIQUE: Multidetector CT imaging of the chest was performed using the
standard protocol during bolus administration of intravenous
contrast. Multiplanar CT image reconstructions and MIPs were
obtained to evaluate the vascular anatomy.
CONTRAST:  80 cc Isovue 370 intravenous

[Series 506: thins pacs · axial · 0.69mm/px · z∈[+67,+337]mm · 17 of 304 slices shown]
[im 17/304  lung]
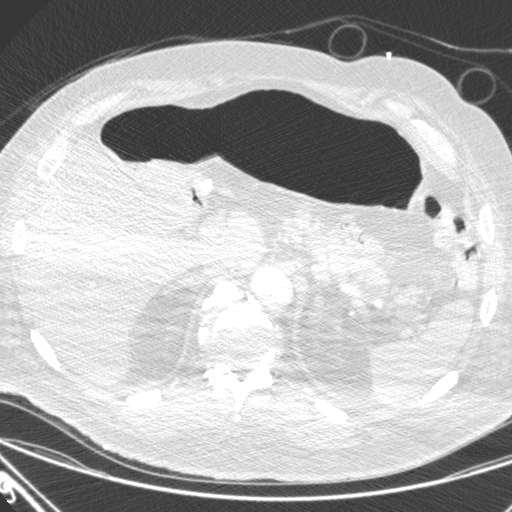
[im 34/304  mediastinal]
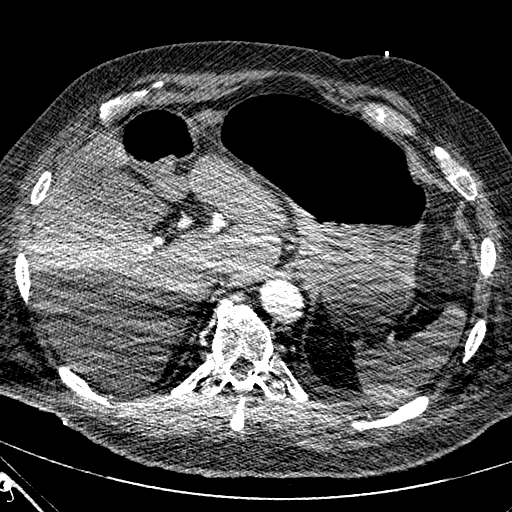
[im 51/304  lung]
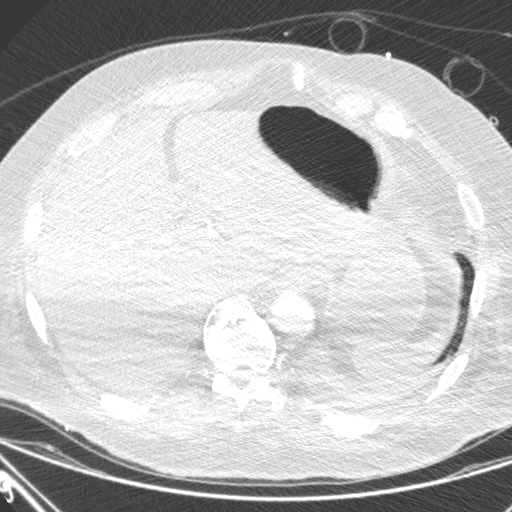
[im 68/304  mediastinal]
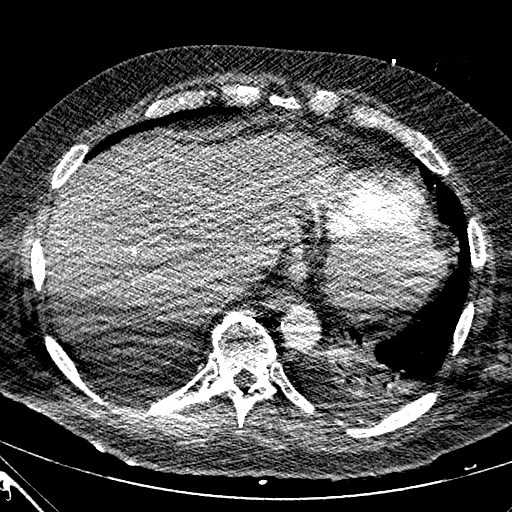
[im 85/304  lung]
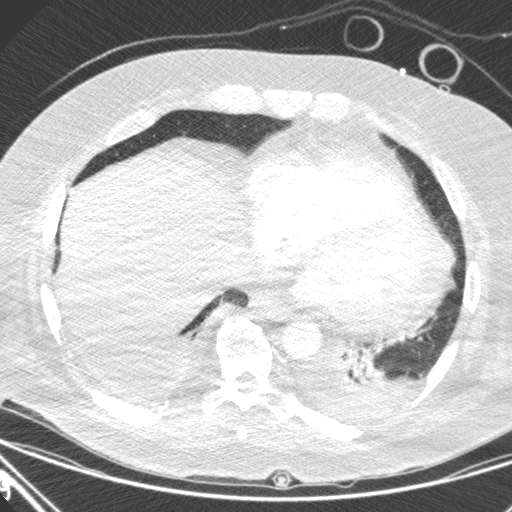
[im 102/304  mediastinal]
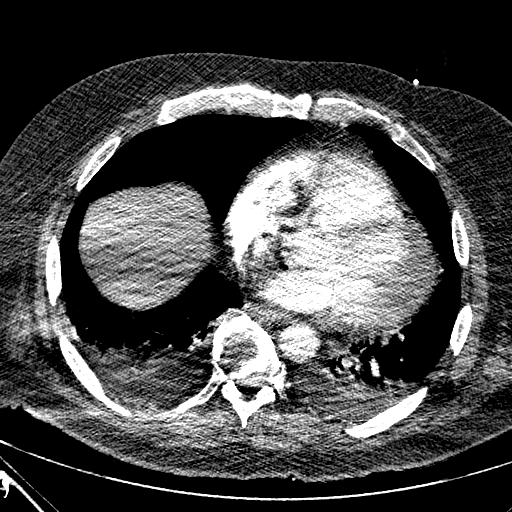
[im 118/304  lung]
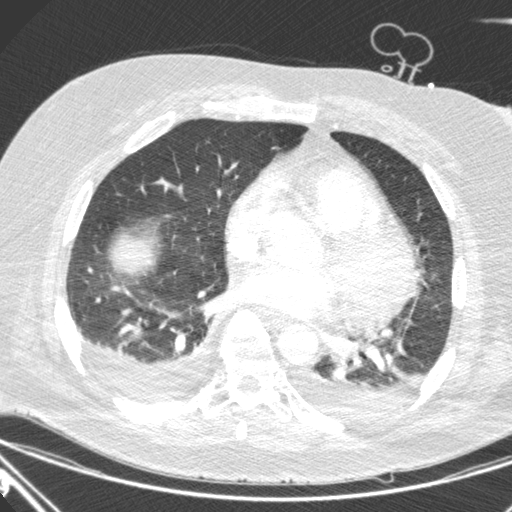
[im 135/304  mediastinal]
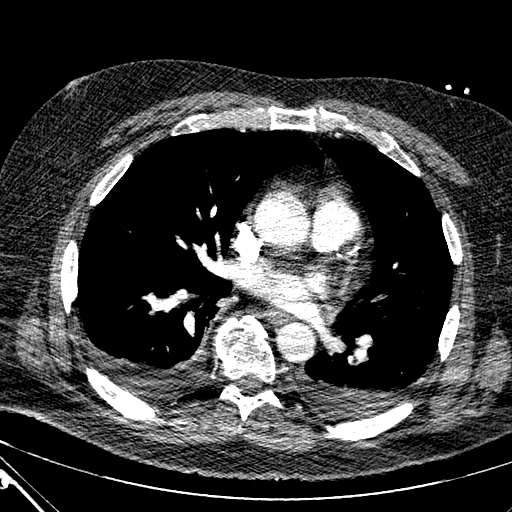
[im 152/304  lung]
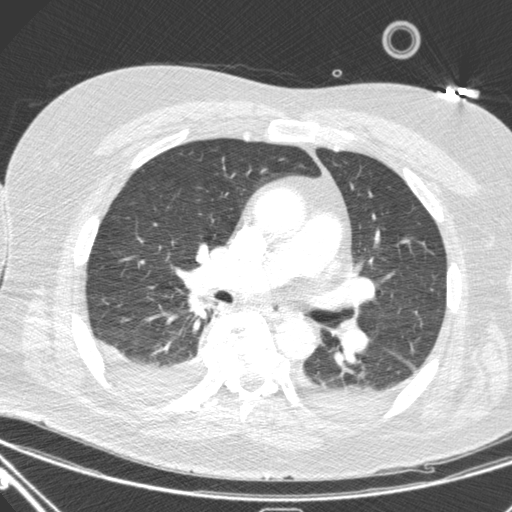
[im 169/304  mediastinal]
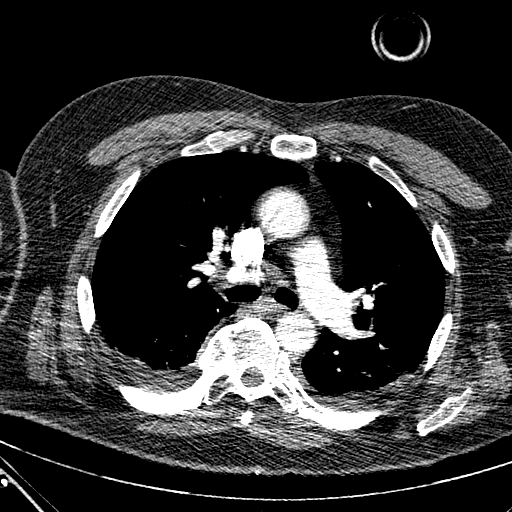
[im 186/304  lung]
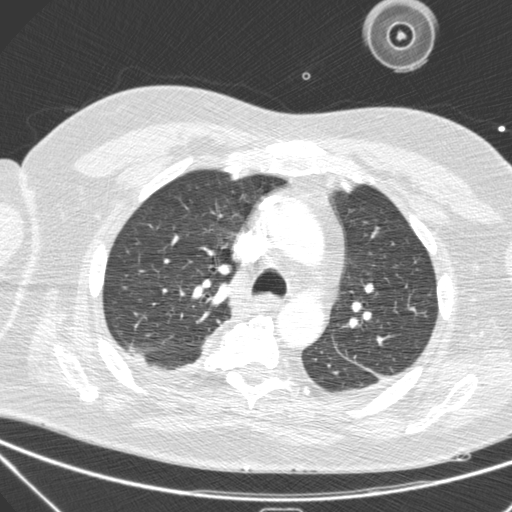
[im 203/304  mediastinal]
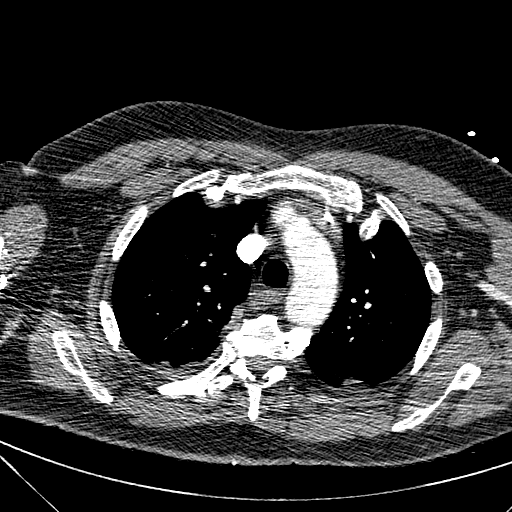
[im 219/304  lung]
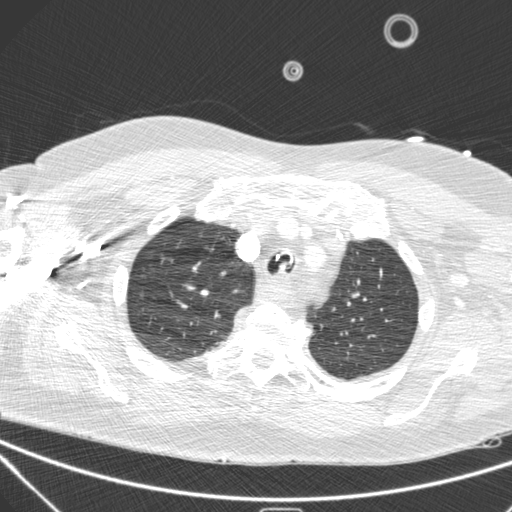
[im 236/304  mediastinal]
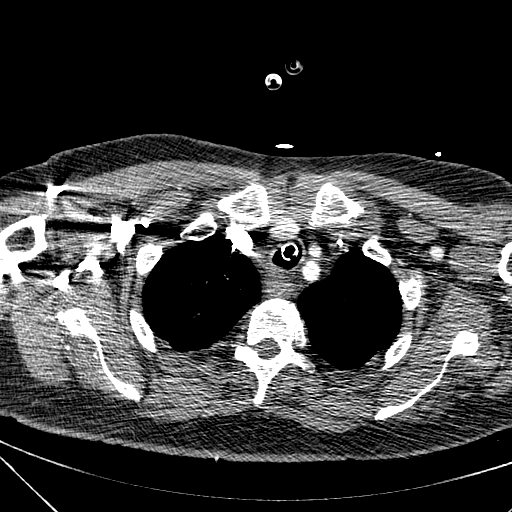
[im 253/304  lung]
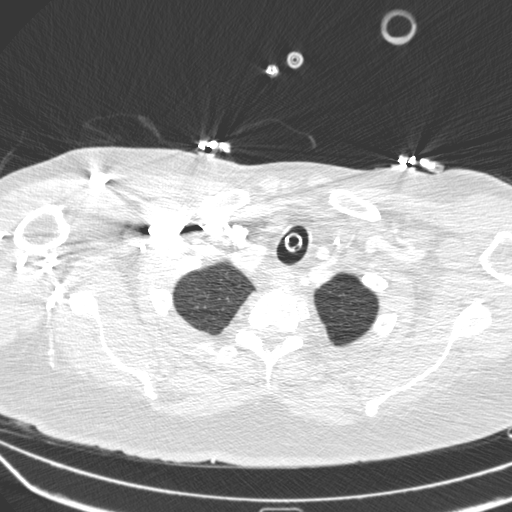
[im 270/304  mediastinal]
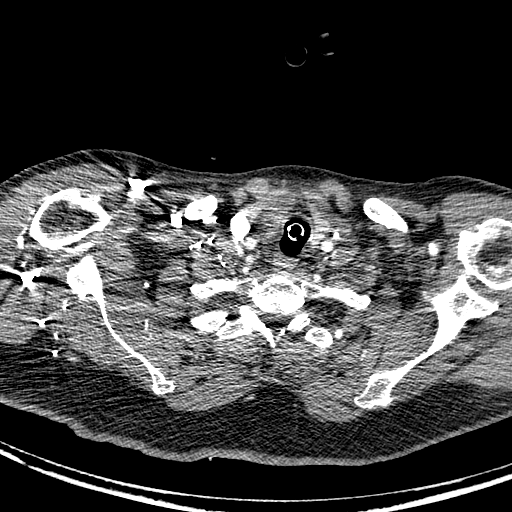
[im 287/304  lung]
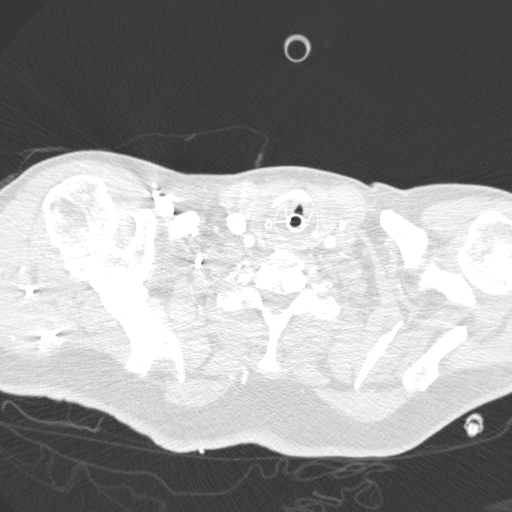

[17 of 36 positions shown; findings below may reference images not displayed]

FINDINGS: Cardiovascular: Suspect left ventricular hypertrophy. No pericardial
effusion. Coronary atherosclerotic calcification. No evidence of
pulmonary embolism or acute aortic syndrome.

Mediastinum: Endotracheal tube with tip above the carina. Left IJ
central line with tip at distal left brachiocephalic vein.

Lungs/Pleura: Small bilateral layering pleural effusion. Dependent
atelectasis, moderate. There is no edema, consolidation, or
pneumothorax.

Upper abdomen: No acute findings.

Musculoskeletal: No chest wall mass or suspicious bone lesions
identified. Diffuse idiopathic skeletal hyperostosis with multilevel
thoracic ankylosis. Partly seen catheter in the spinal canal.

Review of the MIP images confirms the above findings.
IMPRESSION: 1. No evidence of pulmonary embolism.
2. Moderate lower lobe atelectasis with small layering pleural
effusions.

## 2018-03-12 IMAGING — CR DG CHEST 1V PORT
1 series · 1 of 1 positions shown · non-contrast
Comparison: CT 10/05/2015.  Chest x-ray 10/05/2015.

CLINICAL DATA: Intubation.  Respiratory failure .

EXAM:
PORTABLE CHEST 1 VIEW

[AP]
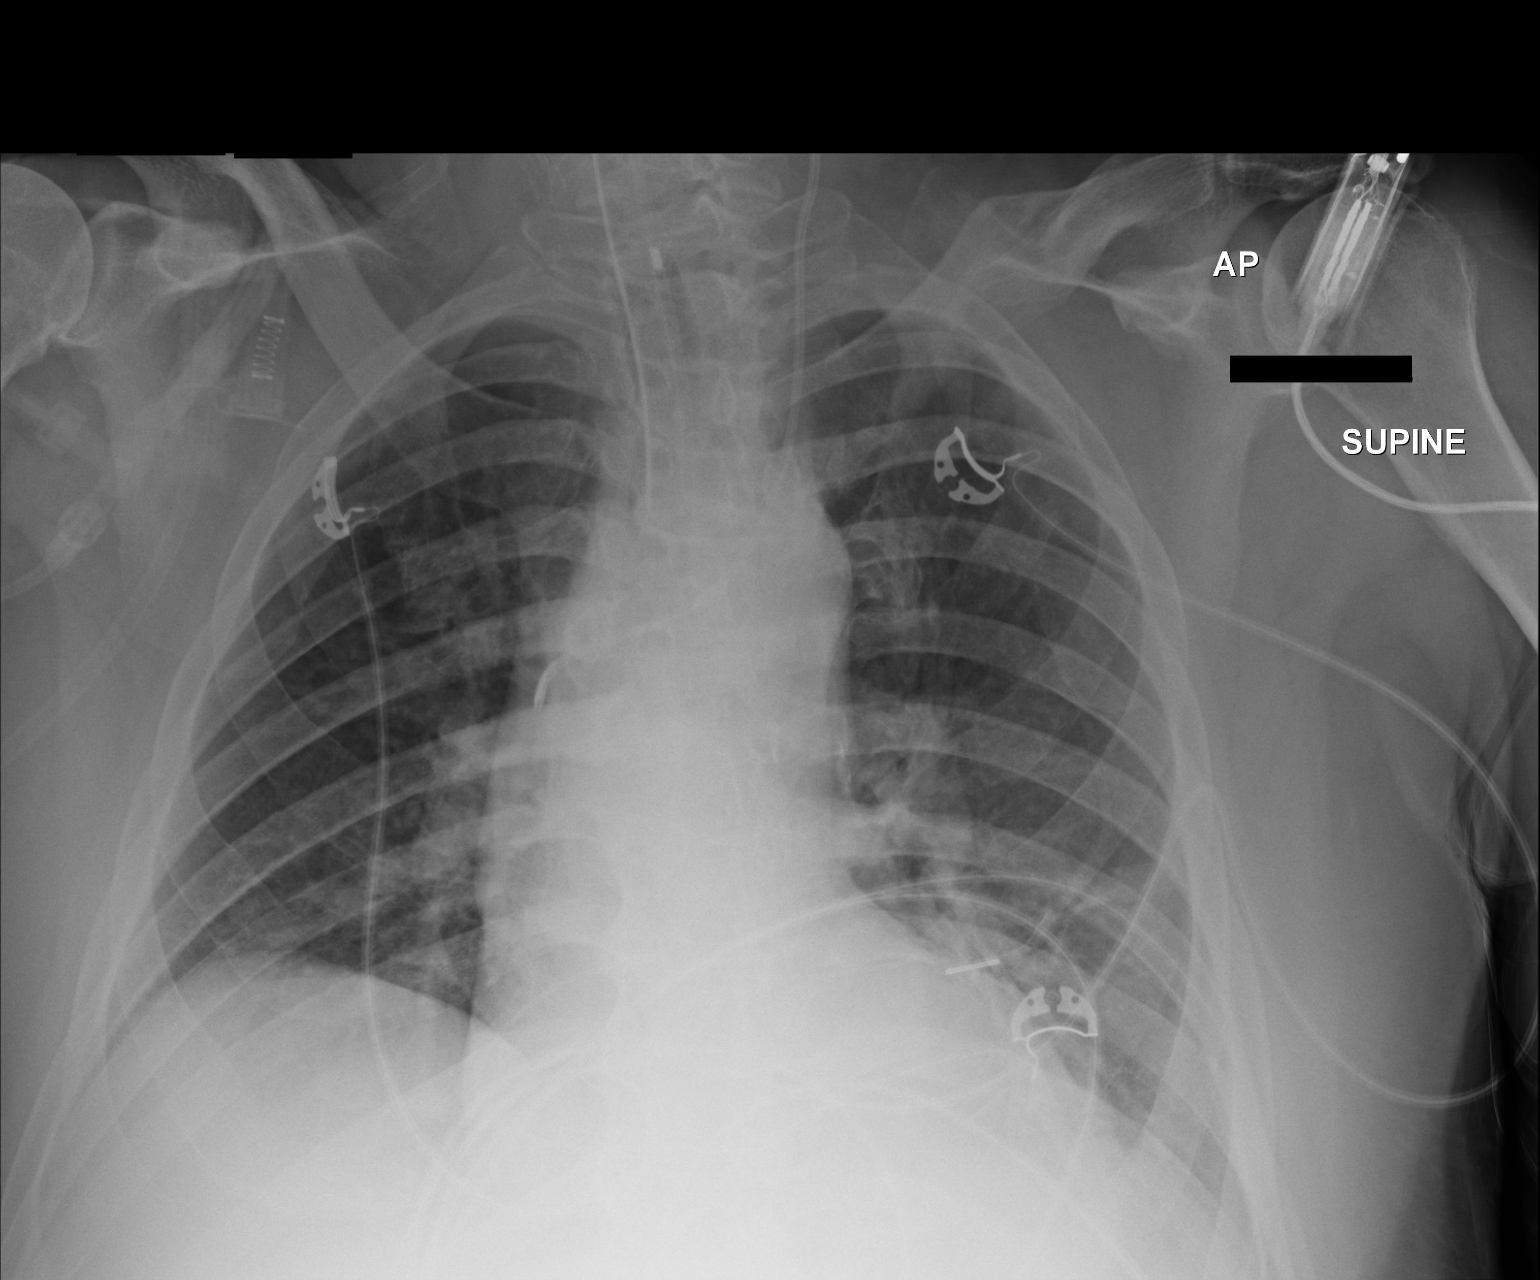

[1 of 1 positions shown; findings below may reference images not displayed]

FINDINGS: Endotracheal tube, left IJ line in stable position. Metallic density
noted left chest. Heart size stable. Normal pulmonary vascularity.
Mild bibasilar subsegmental atelectasis. Small left pleural effusion
cannot be excluded. No pneumothorax .
IMPRESSION: 1. Lines and tubes in stable position
2. Low lung volumes with mild bibasilar atelectasis.

## 2018-03-14 IMAGING — CR DG CHEST 1V PORT
1 series · 1 of 1 positions shown · non-contrast
Comparison: 10/06/2015.  10/05/2015.  CT 10/05/2015 .

CLINICAL DATA: Respiratory failure .

EXAM:
PORTABLE CHEST 1 VIEW

[AP]
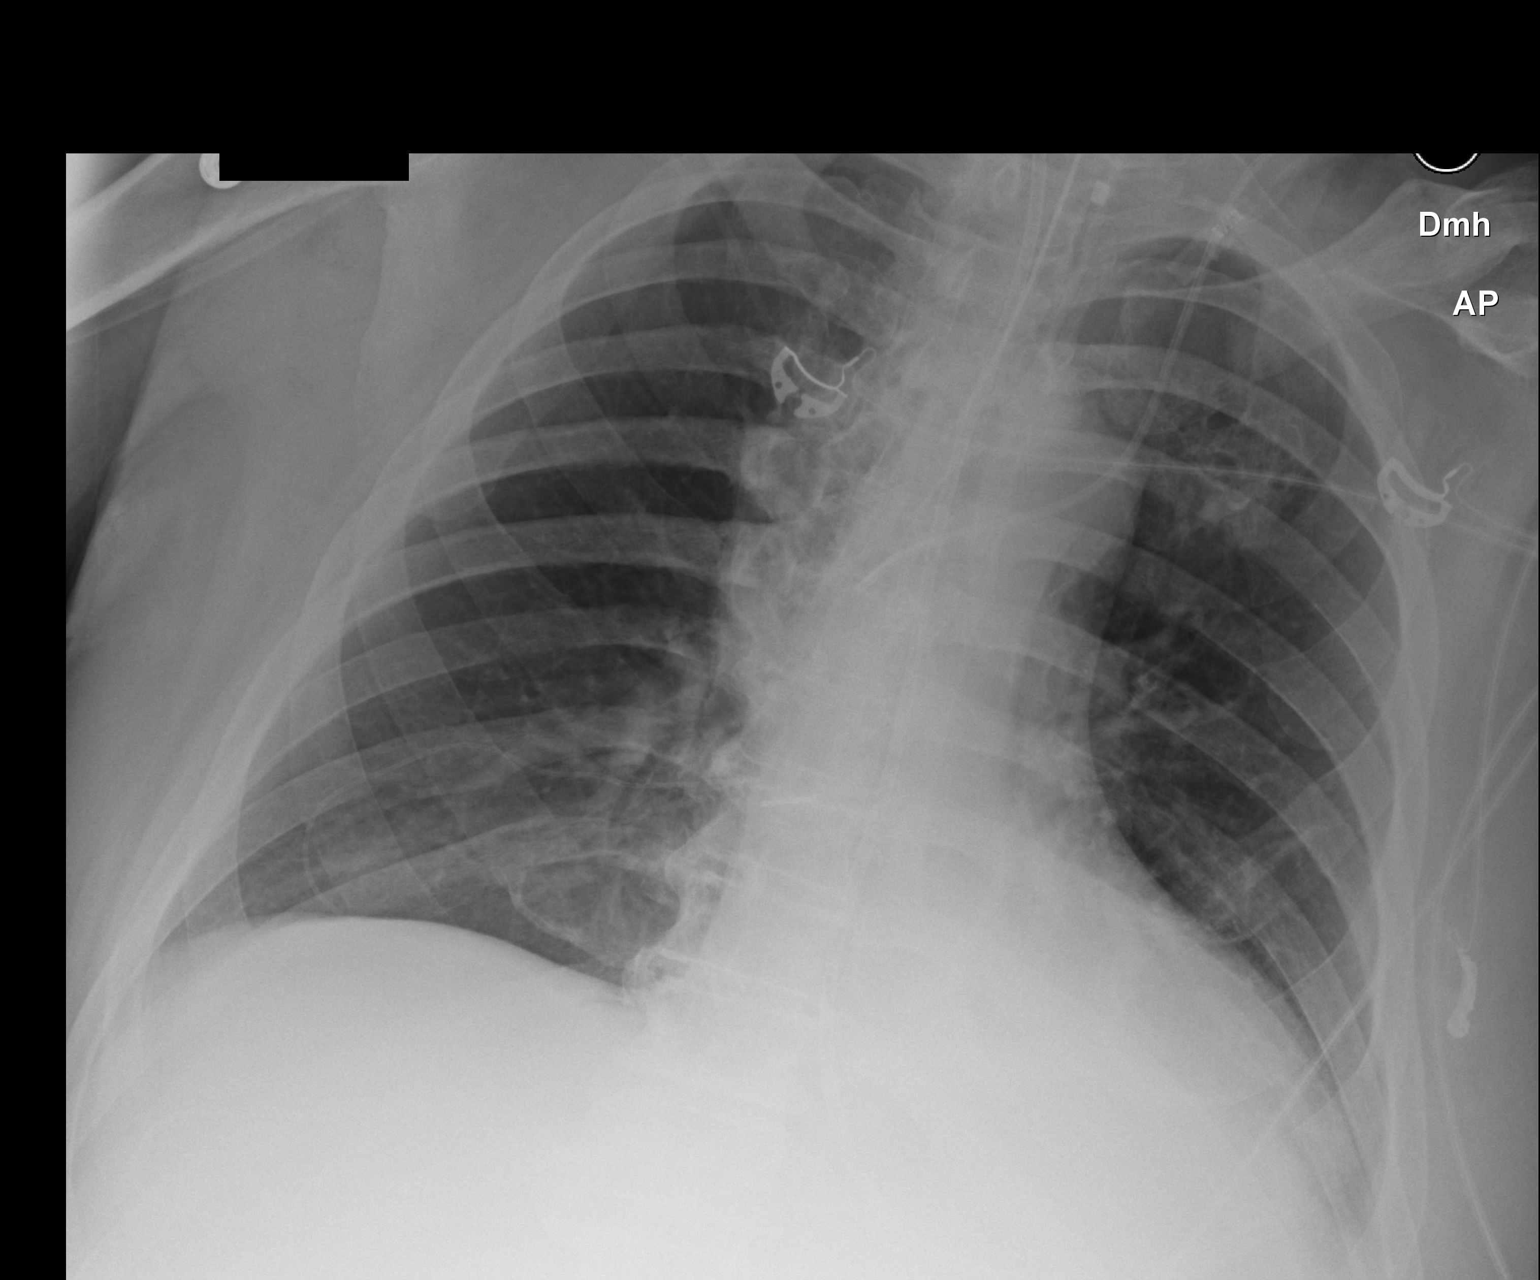

[1 of 1 positions shown; findings below may reference images not displayed]

FINDINGS: Interim placement of feeding tube, its tip is below left
hemidiaphragm. Endotracheal tube and left IJ line stable position.
Cardiomegaly with normal pulmonary vascularity. Low lung volumes
with bibasilar atelectasis and/or infiltrates. Small left pleural
effusion. Degenerative changes thoracic spine.
IMPRESSION: 1. Interim placement of feeding tube, its tip is below left
hemidiaphragm. Endotracheal tube left IJ line stable position.

2. Low lung volumes with bibasilar atelectasis and/or infiltrates.
Small left pleural effusion again noted.

3.  Stable cardiomegaly.  Pulmonary vascularity is normal.

## 2018-03-17 IMAGING — CR DG CHEST 1V PORT
1 series · 1 of 1 positions shown · non-contrast
Comparison: 10/08/2015

CLINICAL DATA: Cough.

EXAM:
PORTABLE CHEST 1 VIEW

[AP]
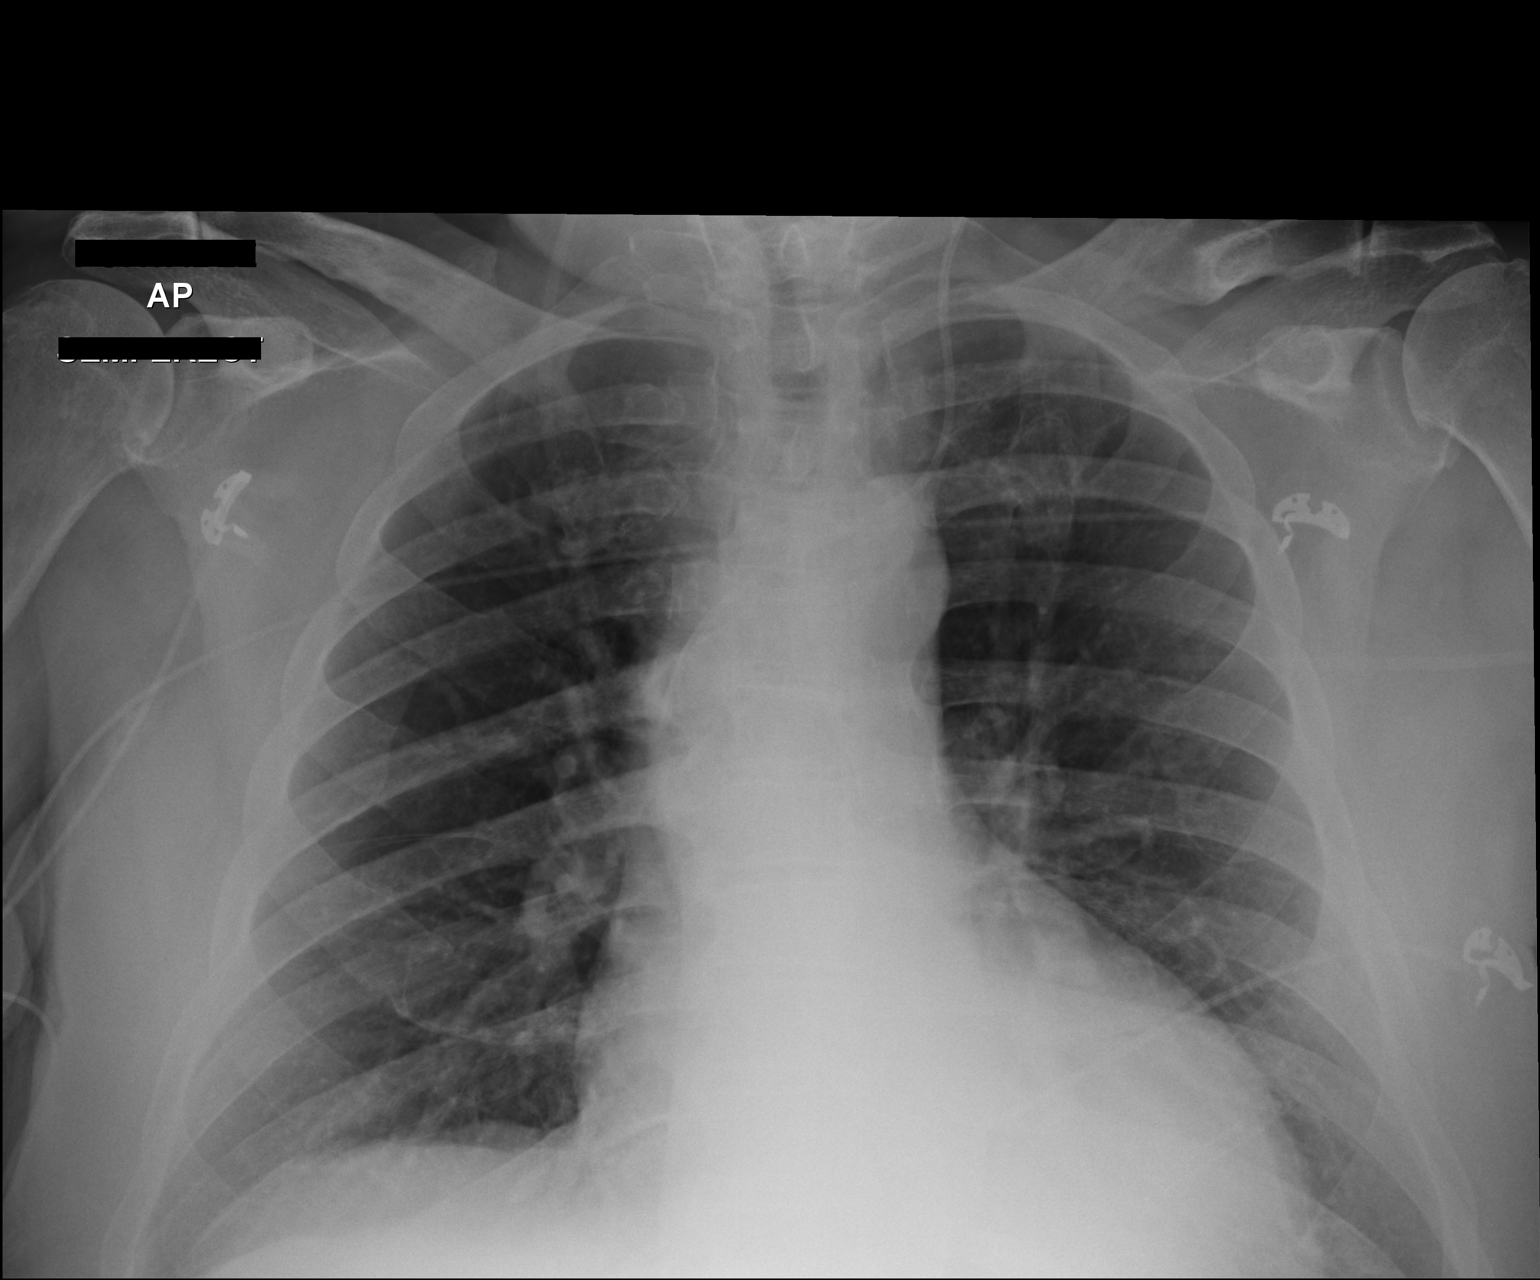

[1 of 1 positions shown; findings below may reference images not displayed]

FINDINGS: Interval removal of endotracheal tube and enteric tube. Left IJ
central venous catheter remains in place with tip over the SVC.
Lungs are adequately inflated with mild hazy left retrocardiac
density unchanged. Left costophrenic angle is cut off the film.
Cardiomediastinal silhouette and remainder of the exam is unchanged.
IMPRESSION: Mild hazy left retrocardiac density unchanged and may be due to
effusion/atelectasis versus infection.

## 2018-07-05 ENCOUNTER — Other Ambulatory Visit: Payer: Self-pay | Admitting: Family Medicine

## 2018-07-05 DIAGNOSIS — M1A0790 Idiopathic chronic gout, unspecified ankle and foot, without tophus (tophi): Secondary | ICD-10-CM

## 2018-07-05 DIAGNOSIS — E790 Hyperuricemia without signs of inflammatory arthritis and tophaceous disease: Secondary | ICD-10-CM

## 2018-09-24 ENCOUNTER — Other Ambulatory Visit: Payer: Self-pay | Admitting: Family Medicine

## 2018-09-24 DIAGNOSIS — I10 Essential (primary) hypertension: Secondary | ICD-10-CM

## 2018-09-27 ENCOUNTER — Encounter: Payer: Self-pay | Admitting: Family Medicine

## 2018-09-27 ENCOUNTER — Ambulatory Visit (INDEPENDENT_AMBULATORY_CARE_PROVIDER_SITE_OTHER): Payer: PPO | Admitting: Family Medicine

## 2018-09-27 ENCOUNTER — Other Ambulatory Visit: Payer: Self-pay

## 2018-09-27 VITALS — BP 156/77 | HR 81 | Temp 98.2°F | Resp 16 | Ht 68.0 in | Wt 247.0 lb

## 2018-09-27 DIAGNOSIS — M1A0790 Idiopathic chronic gout, unspecified ankle and foot, without tophus (tophi): Secondary | ICD-10-CM | POA: Diagnosis not present

## 2018-09-27 DIAGNOSIS — I129 Hypertensive chronic kidney disease with stage 1 through stage 4 chronic kidney disease, or unspecified chronic kidney disease: Secondary | ICD-10-CM

## 2018-09-27 DIAGNOSIS — E782 Mixed hyperlipidemia: Secondary | ICD-10-CM | POA: Diagnosis not present

## 2018-09-27 DIAGNOSIS — M159 Polyosteoarthritis, unspecified: Secondary | ICD-10-CM

## 2018-09-27 DIAGNOSIS — N182 Chronic kidney disease, stage 2 (mild): Secondary | ICD-10-CM | POA: Diagnosis not present

## 2018-09-27 DIAGNOSIS — M150 Primary generalized (osteo)arthritis: Secondary | ICD-10-CM

## 2018-09-27 DIAGNOSIS — R600 Localized edema: Secondary | ICD-10-CM

## 2018-09-27 DIAGNOSIS — E790 Hyperuricemia without signs of inflammatory arthritis and tophaceous disease: Secondary | ICD-10-CM

## 2018-09-27 MED ORDER — ATORVASTATIN CALCIUM 20 MG PO TABS: 20.0000 mg | ORAL_TABLET | Freq: Every day | ORAL | 3 refills | Status: DC

## 2018-09-27 MED ORDER — DICLOFENAC SODIUM 1 % TD GEL: 2.0000 g | Freq: Three times a day (TID) | TRANSDERMAL | 5 refills | Status: DC

## 2018-09-27 MED ORDER — FUROSEMIDE 20 MG PO TABS: 20.0000 mg | ORAL_TABLET | Freq: Every day | ORAL | 3 refills | Status: DC | PRN

## 2018-09-27 MED ORDER — ALLOPURINOL 100 MG PO TABS: 100.0000 mg | ORAL_TABLET | Freq: Every day | ORAL | 3 refills | Status: DC

## 2018-09-27 MED ORDER — AMLODIPINE BESYLATE 10 MG PO TABS: 10.0000 mg | ORAL_TABLET | Freq: Every day | ORAL | 3 refills | Status: DC

## 2018-09-27 NOTE — Progress Notes (Signed)
Subjective:    Patient ID: Danny Patterson, male    DOB: [birth year], 73 y.o.   MRN: [id number]  Danny Patterson is a 73 y.o. male presenting on 09/27/2018 for Hypertension  Last visit 2018. Due for refills.  HPI   CHRONIC HTN with CKD-II Reports home BP avg 140-147 on med, off med 150-160 Current Meds - Amlodipine 5mg  daily   Reports good compliance, took meds today. Tolerating well, w/o complaints. 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 Smoking status: Former Smoker    Types: Cigarettes  . Smokeless tobacco: Former Network engineer Use Topics  . Alcohol use: Yes    Alcohol/week: 3.0 standard drinks    Types: 3 Cans of beer per week    Comment: 05/04/16 3 beers daily  . Drug use: No    Review of Systems Per HPI unless specifically indicated above     Objective:    BP (!) 156/77   Pulse 81   Temp 98.2 F (36.8 C) (Oral)   Resp 16   Ht 5\' 8"  (1.727 m)   Wt 247 lb (112 kg)   BMI 37.56 kg/m   Wt Readings from Last 3 Encounters:  09/27/18 247 lb (112 kg)  01/09/18 246 lb 9.6 oz (111.9 kg)  06/30/17 254 lb 6.4 oz (115.4 kg)    Physical Exam Vitals signs and nursing note reviewed.  Constitutional:      General: He is not in  acute distress.    Appearance: He is well-developed. He is not diaphoretic.     Comments: Well-appearing, comfortable, cooperative  HENT:     Head: Normocephalic and atraumatic.  Eyes:     General:        Right eye: No discharge.        Left eye: No discharge.     Conjunctiva/sclera: Conjunctivae normal.  Neck:     Musculoskeletal: Normal range of motion and neck supple.     Thyroid: No thyromegaly.  Cardiovascular:     Rate and Rhythm: Normal rate and regular rhythm.     Heart sounds: Normal heart sounds. No murmur.  Pulmonary:     Effort: Pulmonary effort is normal. No respiratory distress.     Breath sounds: Normal breath sounds. No wheezing or rales.  Musculoskeletal: Normal range of motion.  Lymphadenopathy:     Cervical: No cervical adenopathy.  Skin:    General: Skin is warm and dry.     Findings: No erythema or rash.  Neurological:     Mental Status: He is alert and oriented to person, place, and time.  Psychiatric:        Behavior: Behavior normal.     Comments: Well groomed, good eye contact, normal speech and thoughts    Results for orders placed  or performed in visit on 01/26/17  Uric acid  Result Value Ref Range   Uric Acid, Serum 7.9 4.0 - 8.0 mg/dL  BASIC METABOLIC PANEL WITH GFR  Result Value Ref Range   Glucose, Bld 96 65 - 139 mg/dL   BUN 18 7 - 25 mg/dL   Creat 1.611.11 0.960.70 - 0.451.18 mg/dL   GFR, Est Non African American 66 > OR = 60 mL/min/1.6873m2   GFR, Est African American 77 > OR = 60 mL/min/1.7273m2   BUN/Creatinine Ratio NOT APPLICABLE 6 - 22 (calc)   Sodium 142 135 - 146 mmol/L   Potassium 4.2 3.5 - 5.3 mmol/L   Chloride 104 98 - 110 mmol/L   CO2 30 20 - 32 mmol/L   Calcium 9.2 8.6 - 10.3 mg/dL      Assessment & Plan:   Problem List Items Addressed This Visit    Gout Stable without flare Reorder Allopurinol 100mg  daily Re-check uric acid in 3 months    Relevant Medications   allopurinol (ZYLOPRIM) 100 MG tablet   Hyperlipidemia associated  with type 2 diabetes mellitus (HCC)  Due for lipid panel, return 3 months physical Reorder statin, atorva 20    Relevant Medications   atorvastatin (LIPITOR) 20 MG tablet   Hypertension - Primary  Elevated BP, will increase Amlodipine from 5 to 10mg  Re order Furosemide PRn for edema   Relevant Medications   amLODipine (NORVASC) 10 MG tablet   furosemide (LASIX) 20 MG tablet   atorvastatin (LIPITOR) 20 MG tablet   Osteoarthritis of multiple joints Chronic problem, without flare Refill diclofenac topical PRN   Relevant Medications   allopurinol (ZYLOPRIM) 100 MG tablet   diclofenac sodium (VOLTAREN) 1 % GEL    Other Visit Diagnoses    Pedal edema       Relevant Medications   furosemide (LASIX) 20 MG tablet   Elevated uric acid in blood       Relevant Medications   allopurinol (ZYLOPRIM) 100 MG tablet      Meds ordered this encounter  Medications  . amLODipine (NORVASC) 10 MG tablet    Sig: Take 1 tablet (10 mg total) by mouth daily.    Dispense:  90 tablet    Refill:  3  . furosemide (LASIX) 20 MG tablet    Sig: Take 1-2 tablets (20-40 mg total) by mouth daily as needed for edema.    Dispense:  60 tablet    Refill:  3  . allopurinol (ZYLOPRIM) 100 MG tablet    Sig: Take 1 tablet (100 mg total) by mouth daily.    Dispense:  90 tablet    Refill:  3  . atorvastatin (LIPITOR) 20 MG tablet    Sig: Take 1 tablet (20 mg total) by mouth daily at 6 PM.    Dispense:  90 tablet    Refill:  3  . diclofenac sodium (VOLTAREN) 1 % GEL    Sig: Apply 2 g topically 3 (three) times daily. As needed for hands and knee, arthritis.    Dispense:  100 g    Refill:  5     Follow up plan: Return in about 3 months (around 12/28/2018) for Annual Physical.  Future labs ordered for 12/2018  Saralyn PilarAlexander , DO [facility] 09/27/2018, 1:36 PM

## 2018-09-27 NOTE — Patient Instructions (Addendum)
Thank you for coming to the office today.  Refills for up to 1 year on medicines.  Take furosemide (lasix) as needed for fluid in ankles and legs if swelling can take 1 or 2 if not needed skip or don't take.  Increased Amlodipine from 5 up to 10mg , for better blood pressure control.  DUE for FASTING BLOOD WORK (no food or drink after midnight before the lab appointment, only water or coffee without cream/sugar on the morning of)  SCHEDULE "Lab Only" visit in the morning at the clinic for lab draw in 3 MONTHS   - Make sure Lab Only appointment is at about 1 week before your next appointment, so that results will be available  For Lab Results, once available within 2-3 days of blood draw, you can can log in to MyChart online to view your results and a brief explanation. Also, we can discuss results at next follow-up visit.   Please schedule a Follow-up Appointment to: Return in about 3 months (around 12/28/2018) for Annual Physical.  If you have any other questions or concerns, please feel free to call the office or send a message through [place]. You may also schedule an earlier appointment if necessary.  Additionally, you may be receiving a survey about your experience at our office within a few days to 1 week by e-mail or mail. We value your feedback.  [name], DO [place]

## 2018-09-28 ENCOUNTER — Other Ambulatory Visit: Payer: Self-pay | Admitting: Family Medicine

## 2018-09-28 DIAGNOSIS — E559 Vitamin D deficiency, unspecified: Secondary | ICD-10-CM

## 2018-09-28 DIAGNOSIS — N138 Other obstructive and reflux uropathy: Secondary | ICD-10-CM

## 2018-09-28 DIAGNOSIS — M1A0790 Idiopathic chronic gout, unspecified ankle and foot, without tophus (tophi): Secondary | ICD-10-CM

## 2018-09-28 DIAGNOSIS — E669 Obesity, unspecified: Secondary | ICD-10-CM

## 2018-09-28 DIAGNOSIS — I10 Essential (primary) hypertension: Secondary | ICD-10-CM

## 2018-09-28 DIAGNOSIS — E1169 Type 2 diabetes mellitus with other specified complication: Secondary | ICD-10-CM

## 2018-09-28 DIAGNOSIS — Z0000 Encounter for general adult medical examination without abnormal findings: Secondary | ICD-10-CM

## 2018-11-13 DIAGNOSIS — E119 Type 2 diabetes mellitus without complications: Secondary | ICD-10-CM | POA: Diagnosis not present

## 2018-11-13 LAB — HM DIABETES EYE EXAM

## 2018-11-16 ENCOUNTER — Encounter: Payer: Self-pay | Admitting: Family Medicine

## 2018-11-17 ENCOUNTER — Other Ambulatory Visit: Payer: Self-pay | Admitting: Family Medicine

## 2018-12-01 ENCOUNTER — Other Ambulatory Visit: Payer: Self-pay

## 2018-12-01 DIAGNOSIS — Z20822 Contact with and (suspected) exposure to covid-19: Secondary | ICD-10-CM

## 2018-12-01 DIAGNOSIS — R6889 Other general symptoms and signs: Secondary | ICD-10-CM | POA: Diagnosis not present

## 2018-12-03 LAB — NOVEL CORONAVIRUS, NAA: SARS-CoV-2, NAA: DETECTED — AB

## 2018-12-05 ENCOUNTER — Encounter: Payer: Self-pay | Admitting: Nurse Practitioner

## 2018-12-05 ENCOUNTER — Ambulatory Visit (INDEPENDENT_AMBULATORY_CARE_PROVIDER_SITE_OTHER): Payer: PPO | Admitting: Nurse Practitioner

## 2018-12-05 ENCOUNTER — Other Ambulatory Visit: Payer: Self-pay

## 2018-12-05 DIAGNOSIS — R110 Nausea: Secondary | ICD-10-CM

## 2018-12-05 DIAGNOSIS — E860 Dehydration: Secondary | ICD-10-CM | POA: Diagnosis not present

## 2018-12-05 DIAGNOSIS — R432 Parageusia: Secondary | ICD-10-CM | POA: Diagnosis not present

## 2018-12-05 DIAGNOSIS — U071 COVID-19: Secondary | ICD-10-CM

## 2018-12-05 MED ORDER — ONDANSETRON 4 MG PO TBDP: 4.0000 mg | ORAL_TABLET | Freq: Three times a day (TID) | ORAL | 0 refills | Status: DC | PRN

## 2018-12-05 NOTE — Progress Notes (Signed)
Telemedicine Encounter: Disclosed to patient at start of encounter that we will provide appropriate telemedicine services.  Patient consents to be treated via phone prior to discussion. - Patient is at his home and is accessed via telephone. - Services are provided by Cassell Smiles from [facility].   Subjective:    Patient ID: Danny Patterson, male    DOB: [birth year], 73 y.o.   MRN: [id number]  Danny Patterson is a 73 y.o. male presenting on 12/05/2018 for Anorexia (lack of appetite, unable to eat or drink  and nauseated. Pt had about 8 ozs of oranges jusice and 16 oz water today.  Yesterday he drunk about 24 oz yesterday. He complains that everything he eats or drinks make him nausea.  Pt temperature 99.-99.2 temperature. Pt currentlly taking Ibuprofen and Tylenol every 6-8hrs. pt had about once daily.  Pt denies SOB, but is coughing. )  HPI   Patient is accompanied on the phone by his Daughter Morey Hummingbird.  Covid 19 complications: dysgeusia, nausea, dehydration Patient reports lack of appetite, not eating food for last 2-3 days.  Patient has been unable to drink much for last 36 hours. States everything causes nausea, but has had no vomiting. Patient has only consumed about 24 oz of liquid each day for the past 2 days.  Has only voided once today with very dark urine.  Patient's skin is more dry and wrinkled than usual (likely poor skin turgor).  Taste of everything is a bit sour, poor tasting as well.  Other covid symptoms include low grade fever, but is taking regular antipyretics; some productive cough with thick mucous. Denies shortness of breath, confusion.  Social History   Tobacco Use  . Smoking status: Former Smoker    Types: Cigarettes  . Smokeless tobacco: Former Network engineer Use Topics  . Alcohol use: Not Currently    Alcohol/week: 3.0 standard drinks    Types: 3 Cans of beer per week    Comment: 05/04/16 3 beers daily  . Drug use: No    Review of Systems  Per HPI unless specifically indicated above     Objective:    There were no vitals taken for this visit.  Wt Readings from Last 3 Encounters:  09/27/18 247 lb (112 kg)  01/09/18 246 lb 9.6 oz (111.9 kg)  06/30/17 254 lb 6.4 oz (115.4 kg)    Physical Exam Patient remotely monitored.  Verbal communication appropriate.  Cognition normal.   Results for orders placed or performed in visit on 12/01/18  Novel Coronavirus, NAA ([facility])   Specimen: Oropharyngeal(OP) collection in vial transport medium   OROPHARYNGEA  TESTING  Result Value Ref Range   SARS-CoV-2, NAA Detected (A) Not Detected      Assessment & Plan:   Problem List Items Addressed This Visit    None    Visit Diagnoses    COVID-19 virus infection    -  Primary   Relevant Medications   ondansetron (ZOFRAN-ODT) 4 MG disintegrating tablet   Nausea       Relevant Medications   ondansetron (ZOFRAN-ODT) 4 MG disintegrating tablet   Dysgeusia       Relevant Medications   ondansetron (ZOFRAN-ODT) 4 MG disintegrating tablet   Dehydration       Relevant Medications   ondansetron (ZOFRAN-ODT) 4 MG disintegrating tablet     Confirmed Covid-19 infection.  Now patient with poor oral intake and dehydration.  Dysgeusia prevents patient from wanting to have oral intake  of any kind, causing nausea with any intake.  Plan: 1. START ondansetron 4 mg q 8 hrs prn nausea. 2. Push fluids, nutrition: Recommend V8 juice, milk, Glucerna, protein shakes, peanut butter/banana smoothies, broth, etc for nutrition and hydration.  Drink at least 72 oz per day - more to rehydrate.   - Need to void more often than every 8-12 hours.  Over next 12-24 hours if urination not improving, needs to seek care in ED. - If unable to tolerate liquids with reduced nausea after using ondansetron, call clinic and/or seek care in ED for rehydration with possible hospital admission. 3. Follow-up 3-7 days prn  Meds ordered this encounter  Medications  .  ondansetron (ZOFRAN-ODT) 4 MG disintegrating tablet    Sig: Take 1 tablet (4 mg total) by mouth every 8 (eight) hours as needed for nausea or vomiting.    Dispense:  20 tablet    Refill:  0    Order Specific Question:   Supervising Provider    Answer:   Smitty CordsKARAMALEGOS, ALEXANDER J [[id number]]   - Time spent in direct consultation with patient via telemedicine about above concerns: 12 minutes  Follow up plan: Follow-up prn 3-7 days  Wilhelmina McardleLauren Evalina Tabak, DNP, AGPCNP-BC Adult Gerontology Primary Care Nurse Practitioner [facility] 12/05/2018, 3:48 PM

## 2018-12-08 ENCOUNTER — Encounter: Payer: Self-pay | Admitting: Nurse Practitioner

## 2018-12-08 ENCOUNTER — Telehealth: Payer: Self-pay | Admitting: Nurse Practitioner

## 2018-12-08 NOTE — Telephone Encounter (Signed)
Brief follow-up phone call placed to patient.  Oral intake is improving, urination is improved as well.  Patient continues to have cough, occasional production.  Breathing well.  Has concerns about how long infection will last.  Advised acute phase lasts 7days - 3 weeks, fatigue and cough could last much longer.  Discussed individual response varies and we do not know how to predict who will have long recovery.  Follow-up prn.

## 2018-12-19 ENCOUNTER — Encounter: Payer: Self-pay | Admitting: Family Medicine

## 2018-12-19 ENCOUNTER — Other Ambulatory Visit: Payer: Self-pay

## 2018-12-19 ENCOUNTER — Ambulatory Visit (INDEPENDENT_AMBULATORY_CARE_PROVIDER_SITE_OTHER): Payer: PPO | Admitting: Family Medicine

## 2018-12-19 VITALS — BP 135/80 | HR 79 | Temp 98.4°F

## 2018-12-19 DIAGNOSIS — I10 Essential (primary) hypertension: Secondary | ICD-10-CM

## 2018-12-19 DIAGNOSIS — E790 Hyperuricemia without signs of inflammatory arthritis and tophaceous disease: Secondary | ICD-10-CM

## 2018-12-19 DIAGNOSIS — M1A0790 Idiopathic chronic gout, unspecified ankle and foot, without tophus (tophi): Secondary | ICD-10-CM | POA: Diagnosis not present

## 2018-12-19 MED ORDER — COLCHICINE 0.6 MG PO TABS: ORAL_TABLET | ORAL | 2 refills | Status: DC

## 2018-12-19 NOTE — Patient Instructions (Addendum)
Thank you for coming to the office today.  Ordered Colchicine for gout.  Will check blood on Thursday  Discuss at next visit gout blood level and restarting Allopurinol.   Please schedule a Follow-up Appointment to: Return in about 1 week (around 12/26/2018), or if symptoms worsen or fail to improve, for gout, as scheduled for upcoming physical.  If you have any other questions or concerns, please feel free to call the office or send a message through [place]. You may also schedule an earlier appointment if necessary.  Additionally, you may be receiving a survey about your experience at our office within a few days to 1 week by e-mail or mail. We value your feedback.  [name], DO [place]

## 2018-12-19 NOTE — Progress Notes (Signed)
Subjective:    Patient ID: Danny Patterson, male    DOB: [birth year], 73 y.o.   MRN: [id number]  Danny Patterson is a 73 y.o. male presenting on 12/19/2018 for Knee Pain (onset 2 weeksLeft side)   HPI   FOLLOW-UP Chronic GOUT Chronic problem, Previously on allopurinol 100mg  daily he has been off med for while now, has history of possible pseudogout, previously followed by Rheum. 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 He has had normal. Current Meds - Off Amlodipine Denies CP, dyspnea, HA, edema, dizziness / lightheadedness   Depression screen [facility] 2/9 12/19/2018 09/27/2018 07/13/2016  Decreased Interest 0 0 0  Down, Depressed, Hopeless 0 0 0  PHQ - 2 Score 0 0 0    Social History   Tobacco Use  . Smoking status: Former Smoker    Types: Cigarettes  . Smokeless tobacco: Former 07/15/2016 Use Topics  . Alcohol use: Not Currently    Alcohol/week: 3.0 standard drinks    Types: 3 Cans of beer per week    Comment: 05/04/16 3 beers daily  . Drug use: No    Review of Systems Per HPI unless specifically indicated above     Objective:    BP 135/80   Pulse 79   Temp 98.4 F (36.9 C) (Oral)   Wt Readings from Last 3 Encounters:  09/27/18 247 lb (112 kg)  01/09/18 246 lb 9.6 oz (111.9 kg)  06/30/17 254 lb 6.4 oz (115.4 kg)    Physical Exam Vitals signs and nursing note reviewed.  Constitutional:      General: He is not in acute distress.    Appearance: He is well-developed. He is not diaphoretic.     Comments: Well-appearing, comfortable, cooperative  HENT:     Head: Normocephalic and atraumatic.  Eyes:     General:        Right eye: No  discharge.        Left eye: No discharge.     Conjunctiva/sclera: Conjunctivae normal.  Cardiovascular:     Rate and Rhythm: Normal rate.  Pulmonary:     Effort: Pulmonary effort is normal.  Musculoskeletal:     Comments: Left lower extremity foot and lower leg trace to +1 edema, mild warmth of foot, no erythema, mild tender to dorsal foot in area in question with gout, knee tender anteriorly Left side, reduced active ROM flex extension, using cane to ambulate  Skin:    General: Skin is warm and dry.     Findings: No erythema or rash.  Neurological:     Mental Status: He is alert and oriented to person, place, and time.  Psychiatric:        Behavior: Behavior normal.     Comments: Well groomed, good eye contact, normal speech and thoughts    Results for orders placed or performed in visit on 12/01/18  Novel Coronavirus, NAA ([facility])   Specimen: Oropharyngeal(OP) collection in vial transport medium   OROPHARYNGEA  TESTING  Result Value Ref Range   SARS-CoV-2, NAA Detected (A) Not Detected      Assessment & Plan:   Problem List Items  Addressed This Visit    Gout    Subacute gout flare L foot/knee, known chronic gout vs pseudogout History of hyperuricemia - off allopurinol currently  Plan Treat acute flare with colchicine, has worked in past, dosing as advised, limit to 7-10 days PRN flare - Return within 1 week for labs for annual as planned, monitor Creatinine GFR function - Anticipate in future can restart Allopurinol + Colchicine prophylaxis depending on uric acid level      Relevant Medications   colchicine 0.6 MG tablet   Hypertension - Primary    Improved BP off meds Off Amlodipine Monitor BP closely Concern elevated due to pain       Other Visit Diagnoses    Elevated uric acid in blood          Meds ordered this encounter  Medications  . colchicine 0.6 MG tablet    Sig: For gout flare take 1 pill then repeat within 2 hours, then may take 1 pill daily  until resolved for 7-10 days.    Dispense:  30 tablet    Refill:  2    Follow up plan: Return in about 1 week (around 12/26/2018), or if symptoms worsen or fail to improve, for gout, as scheduled for upcoming physical.  Future labs already ordered within 1 week return for physical  [name], [facility] 12/19/2018, 2:11 PM

## 2018-12-20 ENCOUNTER — Encounter: Payer: Self-pay | Admitting: Family Medicine

## 2018-12-20 ENCOUNTER — Other Ambulatory Visit: Payer: Self-pay

## 2018-12-20 DIAGNOSIS — E669 Obesity, unspecified: Secondary | ICD-10-CM

## 2018-12-20 DIAGNOSIS — I10 Essential (primary) hypertension: Secondary | ICD-10-CM

## 2018-12-20 DIAGNOSIS — E1169 Type 2 diabetes mellitus with other specified complication: Secondary | ICD-10-CM

## 2018-12-20 DIAGNOSIS — E785 Hyperlipidemia, unspecified: Secondary | ICD-10-CM

## 2018-12-20 DIAGNOSIS — E559 Vitamin D deficiency, unspecified: Secondary | ICD-10-CM

## 2018-12-20 DIAGNOSIS — Z0000 Encounter for general adult medical examination without abnormal findings: Secondary | ICD-10-CM

## 2018-12-20 DIAGNOSIS — N138 Other obstructive and reflux uropathy: Secondary | ICD-10-CM

## 2018-12-20 DIAGNOSIS — N401 Enlarged prostate with lower urinary tract symptoms: Secondary | ICD-10-CM

## 2018-12-20 DIAGNOSIS — M1A0790 Idiopathic chronic gout, unspecified ankle and foot, without tophus (tophi): Secondary | ICD-10-CM

## 2018-12-20 NOTE — Assessment & Plan Note (Signed)
Subacute gout flare L foot/knee, known chronic gout vs pseudogout History of hyperuricemia - off allopurinol currently  Plan Treat acute flare with colchicine, has worked in past, dosing as advised, limit to 7-10 days PRN flare - Return within 1 week for labs for annual as planned, monitor Creatinine GFR function - Anticipate in future can restart Allopurinol + Colchicine prophylaxis depending on uric acid level

## 2018-12-20 NOTE — Assessment & Plan Note (Signed)
Improved BP off meds Off Amlodipine Monitor BP closely Concern elevated due to pain

## 2018-12-21 ENCOUNTER — Other Ambulatory Visit: Payer: Self-pay

## 2018-12-21 ENCOUNTER — Other Ambulatory Visit: Payer: PPO

## 2018-12-21 DIAGNOSIS — E1169 Type 2 diabetes mellitus with other specified complication: Secondary | ICD-10-CM | POA: Diagnosis not present

## 2018-12-21 DIAGNOSIS — I10 Essential (primary) hypertension: Secondary | ICD-10-CM | POA: Diagnosis not present

## 2018-12-21 DIAGNOSIS — M1712 Unilateral primary osteoarthritis, left knee: Secondary | ICD-10-CM | POA: Diagnosis not present

## 2018-12-21 DIAGNOSIS — E669 Obesity, unspecified: Secondary | ICD-10-CM | POA: Diagnosis not present

## 2018-12-21 DIAGNOSIS — M25572 Pain in left ankle and joints of left foot: Secondary | ICD-10-CM | POA: Diagnosis not present

## 2018-12-21 DIAGNOSIS — E785 Hyperlipidemia, unspecified: Secondary | ICD-10-CM | POA: Diagnosis not present

## 2018-12-22 LAB — CBC WITH DIFFERENTIAL/PLATELET
Absolute Monocytes: 540 cells/uL (ref 200–950)
Basophils Absolute: 122 cells/uL (ref 0–200)
Basophils Relative: 1.7 %
Eosinophils Absolute: 230 cells/uL (ref 15–500)
Eosinophils Relative: 3.2 %
HCT: 37.8 % — ABNORMAL LOW (ref 38.5–50.0)
Hemoglobin: 12.3 g/dL — ABNORMAL LOW (ref 13.2–17.1)
Lymphs Abs: 2412 cells/uL (ref 850–3900)
MCH: 29.6 pg (ref 27.0–33.0)
MCHC: 32.5 g/dL (ref 32.0–36.0)
MCV: 90.9 fL (ref 80.0–100.0)
MPV: 9.7 fL (ref 7.5–12.5)
Monocytes Relative: 7.5 %
Neutro Abs: 3895 cells/uL (ref 1500–7800)
Neutrophils Relative %: 54.1 %
Platelets: 403 10*3/uL — ABNORMAL HIGH (ref 140–400)
RBC: 4.16 10*6/uL — ABNORMAL LOW (ref 4.20–5.80)
RDW: 12.7 % (ref 11.0–15.0)
Total Lymphocyte: 33.5 %
WBC: 7.2 10*3/uL (ref 3.8–10.8)

## 2018-12-22 LAB — LIPID PANEL
Cholesterol: 169 mg/dL (ref <200)
HDL: 27 mg/dL — ABNORMAL LOW (ref >=40)
LDL Cholesterol (Calc): 117 mg/dL (calc) — ABNORMAL HIGH
Non-HDL Cholesterol (Calc): 142 mg/dL (calc) — ABNORMAL HIGH (ref <130)
Total CHOL/HDL Ratio: 6.3 (calc) — ABNORMAL HIGH (ref <5.0)
Triglycerides: 134 mg/dL (ref <150)

## 2018-12-22 LAB — COMPLETE METABOLIC PANEL WITH GFR
AG Ratio: 1.3 (calc) (ref 1.0–2.5)
ALT: 29 U/L (ref 9–46)
AST: 36 U/L — ABNORMAL HIGH (ref 10–35)
Albumin: 3.8 g/dL (ref 3.6–5.1)
Alkaline phosphatase (APISO): 78 U/L (ref 35–144)
BUN/Creatinine Ratio: 20 (calc) (ref 6–22)
BUN: 30 mg/dL — ABNORMAL HIGH (ref 7–25)
CO2: 27 mmol/L (ref 20–32)
Calcium: 10.2 mg/dL (ref 8.6–10.3)
Chloride: 104 mmol/L (ref 98–110)
Creat: 1.48 mg/dL — ABNORMAL HIGH (ref 0.70–1.18)
GFR, Est African American: 54 mL/min/{1.73_m2} — ABNORMAL LOW (ref >=60)
GFR, Est Non African American: 46 mL/min/{1.73_m2} — ABNORMAL LOW (ref >=60)
Globulin: 3 g/dL (calc) (ref 1.9–3.7)
Glucose, Bld: 125 mg/dL — ABNORMAL HIGH (ref 65–99)
Potassium: 4.6 mmol/L (ref 3.5–5.3)
Sodium: 143 mmol/L (ref 135–146)
Total Bilirubin: 0.6 mg/dL (ref 0.2–1.2)
Total Protein: 6.8 g/dL (ref 6.1–8.1)

## 2018-12-22 LAB — VITAMIN D 25 HYDROXY (VIT D DEFICIENCY, FRACTURES): Vit D, 25-Hydroxy: 44 ng/mL (ref 30–100)

## 2018-12-22 LAB — PSA: PSA: 1.1 ng/mL (ref <=4.0)

## 2018-12-22 LAB — HEMOGLOBIN A1C
Hgb A1c MFr Bld: 5.7 % of total Hgb — ABNORMAL HIGH (ref <5.7)
Mean Plasma Glucose: 117 (calc)
eAG (mmol/L): 6.5 (calc)

## 2018-12-22 LAB — URIC ACID: Uric Acid, Serum: 9.4 mg/dL — ABNORMAL HIGH (ref 4.0–8.0)

## 2018-12-28 ENCOUNTER — Encounter: Payer: Self-pay | Admitting: Family Medicine

## 2018-12-28 ENCOUNTER — Other Ambulatory Visit: Payer: Self-pay

## 2018-12-28 ENCOUNTER — Other Ambulatory Visit: Payer: Self-pay | Admitting: Family Medicine

## 2018-12-28 ENCOUNTER — Ambulatory Visit (INDEPENDENT_AMBULATORY_CARE_PROVIDER_SITE_OTHER): Payer: PPO | Admitting: Family Medicine

## 2018-12-28 VITALS — BP 161/84 | HR 70 | Temp 98.8°F | Resp 16 | Ht 68.0 in | Wt 227.0 lb

## 2018-12-28 DIAGNOSIS — M1A0790 Idiopathic chronic gout, unspecified ankle and foot, without tophus (tophi): Secondary | ICD-10-CM

## 2018-12-28 DIAGNOSIS — E669 Obesity, unspecified: Secondary | ICD-10-CM | POA: Insufficient documentation

## 2018-12-28 DIAGNOSIS — M79672 Pain in left foot: Secondary | ICD-10-CM | POA: Diagnosis not present

## 2018-12-28 DIAGNOSIS — E785 Hyperlipidemia, unspecified: Secondary | ICD-10-CM | POA: Diagnosis not present

## 2018-12-28 DIAGNOSIS — M4726 Other spondylosis with radiculopathy, lumbar region: Secondary | ICD-10-CM

## 2018-12-28 DIAGNOSIS — N200 Calculus of kidney: Secondary | ICD-10-CM

## 2018-12-28 DIAGNOSIS — M109 Gout, unspecified: Secondary | ICD-10-CM | POA: Diagnosis not present

## 2018-12-28 DIAGNOSIS — E1169 Type 2 diabetes mellitus with other specified complication: Secondary | ICD-10-CM

## 2018-12-28 DIAGNOSIS — G6281 Critical illness polyneuropathy: Secondary | ICD-10-CM

## 2018-12-28 DIAGNOSIS — Z0000 Encounter for general adult medical examination without abnormal findings: Secondary | ICD-10-CM

## 2018-12-28 DIAGNOSIS — E1121 Type 2 diabetes mellitus with diabetic nephropathy: Secondary | ICD-10-CM

## 2018-12-28 DIAGNOSIS — N1831 Chronic kidney disease, stage 3a: Secondary | ICD-10-CM | POA: Insufficient documentation

## 2018-12-28 LAB — POCT UA - MICROALBUMIN: Microalbumin Ur, POC: 0 mg/L

## 2018-12-28 MED ORDER — PREDNISONE 10 MG PO TABS: ORAL_TABLET | ORAL | 0 refills | Status: DC

## 2018-12-28 NOTE — Assessment & Plan Note (Signed)
Resume Atorvastatin, he had stopped it previously Fairly well controlled lipids

## 2018-12-28 NOTE — Assessment & Plan Note (Signed)
Well-controlled DM with I2M 5.7 Complications - CKD-III, peripheral neuropathy (not necessarily 2/2 DM - with critical illness neuropathy)  Plan:  1. Not on therapy 2. Encourage improved lifestyle - low carb, low sugar diet, reduce portion size, start regular exercise 3. Resume Statin 4. DM Foot exam done today - UTD DM ophtho - Check urine microalbumin 0 negative

## 2018-12-28 NOTE — Assessment & Plan Note (Signed)
Followed by Urology Restart uric acid lowering therapy allopurinol

## 2018-12-28 NOTE — Patient Instructions (Addendum)
Thank you for coming to the office today.  Still pain in knee and lower extremity can be from Gout - Uric Acid was elevated >9  Start Prednisone taper as discussed for pain and swelling. RESTART Colchicine 0.6mg  ONCE daily - for at least 4 to 6 weeks then can STOP. RESTART Allopurinol 100mg  once daily - every day for longer term.  STOP Meloxicam.  Recommend to start taking Tylenol Extra Strength 500mg  tabs - take 1 to 2 tabs per dose (max 1000mg ) every 6-8 hours for pain (take regularly, don't skip a dose for next 7 days), max 24 hour daily dose is 6 tablets or 3000mg . In the future you can repeat the same everyday Tylenol course for 1-2 weeks at a time.    Updated med list  DUE for FASTING BLOOD WORK (no food or drink after midnight before the lab appointment, only water or coffee without cream/sugar on the morning of)  SCHEDULE "Lab Only" visit in the morning at the clinic for lab draw in 4 MONTHS   - Make sure Lab Only appointment is at about 1 week before your next appointment, so that results will be available  For Lab Results, once available within 2-3 days of blood draw, you can can log in to MyChart online to view your results and a brief explanation. Also, we can discuss results at next follow-up visit.   Please schedule a Follow-up Appointment to: Return in about 4 months (around 04/30/2019) for 4 month DM , HTN, Neuropathy, Gout.  If you have any other questions or concerns, please feel free to call the office or send a message through [place]. You may also schedule an earlier appointment if necessary.  Additionally, you may be receiving a survey about your experience at our office within a few days to 1 week by e-mail or mail. We value your feedback.  [name], DO [place]

## 2018-12-28 NOTE — Assessment & Plan Note (Signed)
Stable chronic problem since 2017 Previously followed by Neurology Off medication  May be affecting his LLE pain with his known gout and OA/DJD as well. On trial prednisone

## 2018-12-28 NOTE — Progress Notes (Signed)
Subjective:    Patient ID: Danny Patterson, male    DOB: 24-Jul-1945, 73 y.o.   MRN: [id number]  Danny Patterson is a 73 y.o. male presenting on 12/28/2018 for No chief complaint on file.   HPI   Here for Annual Physical and Lab Review.  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 He does take Lasix for fluid. Elevated Creatinine. Current Meds - Off Amlodipine Denies CP, dyspnea, HA, edema, dizziness / lightheadedness  CHRONIC DM, Type 2 / nephropathy No concerns. Sugar controlled Not on medicine Not on acei arb Denies hypoglycemia, polyuria, visual changes, numbness or tingling.  HYPERLIPIDEMIA: - Reports no concerns. Last lipid panel 12/2018, mild elevated LDL 117 overall controlled on statin atorva 20  Additional complaint  FOLLOW-UP Chronic GOUT Last visit with me 9/29 - restarted Colchicine for gout flare after off allopurinol and other meds for a while, he had been seen by Rheumatology in past, had current flare, limited relief from initial colchicine, he went to urgent care [facility] and has copy of x-rays of knee and leg, they gave him meloxicam dx arthritis as well. - Improved but still stiff and painful at times, redness and swelling has subsided   Depression screen [facility] 2/9 12/19/2018 09/27/2018 07/13/2016  Decreased Interest 0 0 0  Down, Depressed, Hopeless 0 0 0  PHQ - 2 Score 0 0 0    Past Medical History:  Diagnosis Date  . Allergy   . Arthritis   . Gout   . Hyperlipidemia   . Hyperlipidemia   . Hypertension   . Kidney stone   . Rheumatic fever    Past Surgical History:  Procedure Laterality Date  . BACK SURGERY    . knot     removed from neck  . POSTERIOR LUMBAR FUSION 4 LEVEL N/A 10/01/2015   Procedure: Lumbar three-four,  Lumbar four-five,  Lumbar five-Sacrum one posterior lumbar interbody fusion with Laminotomy at Lumbar Two-Three;  Surgeon:  Hilda Lias, MD;  Location: [facility];  Service: Neurosurgery;  Laterality: N/A;   Social History   Socioeconomic History  . Marital status: Married    Spouse name: Patty  . Number of children: 3  . Years of education: 30  . Highest education level: Not on file  Occupational History    Comment: self employed  Social Needs  . Financial resource strain: Not on file  . Food insecurity    Worry: Not on file    Inability: Not on file  . Transportation needs    Medical: Not on file    Non-medical: Not on file  Tobacco Use  . Smoking status: Former Smoker    Types: Cigarettes  . Smokeless tobacco: Former Engineer, water and Sexual Activity  . Alcohol use: Not Currently    Alcohol/week: 3.0 standard drinks    Types: 3 Cans of beer per week    Comment: 05/04/16 3 beers daily  . Drug use: No  . Sexual activity: Not on file  Lifestyle  . Physical activity    Days per week: Not on file    Minutes per session: Not on file  . Stress: Not on file  Relationships  . Social Musician on phone: Not on file    Gets together: Not on file    Attends religious service: Not on file    Active member  of club or organization: Not on file    Attends meetings of clubs or organizations: Not on file    Relationship status: Not on file  . Intimate partner violence    Fear of current or ex partner: Not on file    Emotionally abused: Not on file    Physically abused: Not on file    Forced sexual activity: Not on file  Other Topics Concern  . Not on file  Social History Narrative   Lives with wife   Caffeine- coffee, 2 cups daily   Family History  Problem Relation Age of Onset  . Parkinson's disease Father 1368  . Kidney disease Maternal Grandmother   . Hypertension Sister   . Kidney cancer Neg Hx   . Prostate cancer Neg Hx    Current Outpatient Medications on File Prior to Visit  Medication Sig  . acetaminophen (TYLENOL) 500 MG tablet Take 2 tablets (1,000 mg total) by  mouth every 8 (eight) hours as needed.  Marland Kitchen. allopurinol (ZYLOPRIM) 100 MG tablet Take 1 tablet (100 mg total) by mouth daily.  Marland Kitchen. atorvastatin (LIPITOR) 20 MG tablet Take 1 tablet (20 mg total) by mouth daily at 6 PM.  . cholecalciferol (VITAMIN D) 1000 units tablet Take 1,000 Units daily by mouth.  . colchicine 0.6 MG tablet For gout flare take 1 pill then repeat within 2 hours, then may take 1 pill daily until resolved for 7-10 days.  . diclofenac sodium (VOLTAREN) 1 % GEL Apply 2 g topically 3 (three) times daily. As needed for hands and knee, arthritis.  . furosemide (LASIX) 20 MG tablet Take 1-2 tablets (20-40 mg total) by mouth daily as needed for edema.  Marland Kitchen. HORSE CHESTNUT PO Take by mouth.  . magnesium gluconate (MAGONATE) 500 MG tablet Take 500 mg by mouth daily.  . potassium citrate (UROCIT-K) 10 MEQ (1080 MG) SR tablet Take 10 mEq by mouth 3 (three) times daily with meals.   No current facility-administered medications on file prior to visit.     Review of Systems Per HPI unless specifically indicated above      Objective:    BP (!) 161/84   Pulse 70   Temp 98.8 F (37.1 C) (Oral)   Resp 16   Ht 5\' 8"  (1.727 m)   Wt 227 lb (103 kg)   BMI 34.52 kg/m   Wt Readings from Last 3 Encounters:  12/28/18 227 lb (103 kg)  09/27/18 247 lb (112 kg)  01/09/18 246 lb 9.6 oz (111.9 kg)    Physical Exam   Diabetic Foot Exam - Simple   Simple Foot Form Diabetic Foot exam was performed with the following findings: Yes 12/28/2018  9:40 AM  Visual Inspection No deformities, no ulcerations, no other skin breakdown bilaterally: Yes Sensation Testing Intact to touch and monofilament testing bilaterally: Yes Pulse Check Posterior Tibialis and Dorsalis pulse intact bilaterally: Yes Comments      Results for orders placed or performed in visit on 12/28/18  POCT UA - Microalbumin  Result Value Ref Range   Microalbumin Ur, POC 0 mg/L      Assessment & Plan:   Problem List Items  Addressed This Visit    Critical illness neuropathy (HCC)    Stable chronic problem since 2017 Previously followed by Neurology Off medication  May be affecting his LLE pain with his known gout and OA/DJD as well. On trial prednisone      Gout    Seems improving gout flare  now, likely presented to care late delaying response of med  Trial Prednisone burst for gout/arthritis/neuropathy Restart Allopurinol 100mg  daily Restart Colchicine 0.6mg  daily for at least 4 weeks prophylaxis DC NSAID      Relevant Medications   predniSONE (DELTASONE) 10 MG tablet   Hyperlipidemia associated with type 2 diabetes mellitus (HCC)    Resume Atorvastatin, he had stopped it previously Fairly well controlled lipids      Obesity (BMI 30.0-34.9)    Encourage weight loss      Osteoarthritis of spine with radiculopathy, lumbar region    Contributing to chronic back pain and neuropathy in LLE      Relevant Medications   predniSONE (DELTASONE) 10 MG tablet   Type 2 diabetes with nephropathy ([place])    Well-controlled DM with Z6X 5.7 Complications - CKD-III, peripheral neuropathy (not necessarily 2/2 DM - with critical illness neuropathy)  Plan:  1. Not on therapy 2. Encourage improved lifestyle - low carb, low sugar diet, reduce portion size, start regular exercise 3. Resume Statin 4. DM Foot exam done today - UTD DM ophtho - Check urine microalbumin 0 negative      Uric acid nephrolithiasis    Followed by Urology Restart uric acid lowering therapy allopurinol       Other Visit Diagnoses    Annual physical exam    -  Primary   Left foot pain          Updated Health Maintenance information Reviewed recent lab results with patient Encouraged improvement to lifestyle with diet and exercise - Goal of weight loss   Meds ordered this encounter  Medications  . predniSONE (DELTASONE) 10 MG tablet    Sig: Take 4 tablets (40mg ) for 2 days, then 3 tab (30mg ) for 2 days, then 2 tab (20mg )  for 2 days, then 1 tab (10mg ) for 2 days.    Dispense:  20 tablet    Refill:  0    Follow up plan: Return in about 4 months (around 04/30/2019) for 4 month DM , HTN, Neuropathy, Gout.   Future labs A1c, Uric Acid, BMET 03/2019  [name], [facility] 12/28/2018, 9:12 AM

## 2018-12-28 NOTE — Assessment & Plan Note (Signed)
Seems improving gout flare now, likely presented to care late delaying response of med  Trial Prednisone burst for gout/arthritis/neuropathy Restart Allopurinol 100mg  daily Restart Colchicine 0.6mg  daily for at least 4 weeks prophylaxis DC NSAID

## 2018-12-28 NOTE — Assessment & Plan Note (Signed)
Contributing to chronic back pain and neuropathy in LLE

## 2018-12-28 NOTE — Assessment & Plan Note (Signed)
Encourage weight loss. 

## 2019-01-09 ENCOUNTER — Other Ambulatory Visit: Payer: Self-pay | Admitting: Urology

## 2019-01-09 ENCOUNTER — Other Ambulatory Visit: Payer: Self-pay | Admitting: *Deleted

## 2019-01-09 DIAGNOSIS — N200 Calculus of kidney: Secondary | ICD-10-CM

## 2019-01-10 ENCOUNTER — Ambulatory Visit: Payer: PPO | Admitting: Urology

## 2019-05-13 ENCOUNTER — Other Ambulatory Visit: Payer: Self-pay | Admitting: Family Medicine

## 2019-05-13 DIAGNOSIS — M1A0790 Idiopathic chronic gout, unspecified ankle and foot, without tophus (tophi): Secondary | ICD-10-CM

## 2019-06-09 ENCOUNTER — Ambulatory Visit: Payer: PPO | Attending: Internal Medicine

## 2019-06-09 DIAGNOSIS — Z23 Encounter for immunization: Secondary | ICD-10-CM

## 2019-06-09 NOTE — Progress Notes (Signed)
   Covid-19 Vaccination Clinic  Name:  Danny Patterson    MRN: [id number] DOB: November 13, 1945  06/09/2019  [name] was observed post Covid-19 immunization for 30 minutes without incident. He was provided with Vaccine Information Sheet and instruction to access the V-Safe system.   Mr. Gram was instructed to call 911 with any severe reactions post vaccine: Marland Kitchen Difficulty breathing  . Swelling of face and throat  . A fast heartbeat  . A bad rash all over body  . Dizziness and weakness   Immunizations Administered    Name Date Dose VIS Date Route   Pfizer COVID-19 Vaccine 06/09/2019  2:29 PM 0.3 mL 03/02/2019 Intramuscular   Manufacturer: [employer], [employer]   Lot: [id number]   NDC: [address]-7

## 2019-06-19 ENCOUNTER — Telehealth: Payer: Self-pay | Admitting: Family Medicine

## 2019-06-19 DIAGNOSIS — E1169 Type 2 diabetes mellitus with other specified complication: Secondary | ICD-10-CM

## 2019-06-19 DIAGNOSIS — N138 Other obstructive and reflux uropathy: Secondary | ICD-10-CM

## 2019-06-19 DIAGNOSIS — Z0000 Encounter for general adult medical examination without abnormal findings: Secondary | ICD-10-CM

## 2019-06-19 DIAGNOSIS — N200 Calculus of kidney: Secondary | ICD-10-CM

## 2019-06-19 DIAGNOSIS — E785 Hyperlipidemia, unspecified: Secondary | ICD-10-CM

## 2019-06-19 DIAGNOSIS — I10 Essential (primary) hypertension: Secondary | ICD-10-CM

## 2019-06-19 DIAGNOSIS — E1121 Type 2 diabetes mellitus with diabetic nephropathy: Secondary | ICD-10-CM

## 2019-06-19 NOTE — Telephone Encounter (Signed)
Labs ordered  [name], DO [facility] 06/19/2019, 6:30 PM

## 2019-06-22 ENCOUNTER — Other Ambulatory Visit: Payer: PPO

## 2019-06-22 ENCOUNTER — Other Ambulatory Visit: Payer: Self-pay

## 2019-06-22 DIAGNOSIS — N138 Other obstructive and reflux uropathy: Secondary | ICD-10-CM | POA: Diagnosis not present

## 2019-06-22 DIAGNOSIS — E785 Hyperlipidemia, unspecified: Secondary | ICD-10-CM

## 2019-06-22 DIAGNOSIS — I10 Essential (primary) hypertension: Secondary | ICD-10-CM | POA: Diagnosis not present

## 2019-06-22 DIAGNOSIS — N200 Calculus of kidney: Secondary | ICD-10-CM

## 2019-06-22 DIAGNOSIS — E1121 Type 2 diabetes mellitus with diabetic nephropathy: Secondary | ICD-10-CM | POA: Diagnosis not present

## 2019-06-22 DIAGNOSIS — Z0000 Encounter for general adult medical examination without abnormal findings: Secondary | ICD-10-CM | POA: Diagnosis not present

## 2019-06-22 DIAGNOSIS — E1169 Type 2 diabetes mellitus with other specified complication: Secondary | ICD-10-CM | POA: Diagnosis not present

## 2019-06-22 DIAGNOSIS — N401 Enlarged prostate with lower urinary tract symptoms: Secondary | ICD-10-CM | POA: Diagnosis not present

## 2019-06-23 LAB — LIPID PANEL
Cholesterol: 131 mg/dL (ref <200)
HDL: 57 mg/dL (ref >=40)
LDL Cholesterol (Calc): 61 mg/dL (calc)
Non-HDL Cholesterol (Calc): 74 mg/dL (calc) (ref <130)
Total CHOL/HDL Ratio: 2.3 (calc) (ref <5.0)
Triglycerides: 52 mg/dL (ref <150)

## 2019-06-23 LAB — CBC WITH DIFFERENTIAL/PLATELET
Absolute Monocytes: 350 cells/uL (ref 200–950)
Basophils Absolute: 69 cells/uL (ref 0–200)
Basophils Relative: 1.3 %
Eosinophils Absolute: 339 cells/uL (ref 15–500)
Eosinophils Relative: 6.4 %
HCT: 38.7 % (ref 38.5–50.0)
Hemoglobin: 13.3 g/dL (ref 13.2–17.1)
Lymphs Abs: 1770 cells/uL (ref 850–3900)
MCH: 32.6 pg (ref 27.0–33.0)
MCHC: 34.4 g/dL (ref 32.0–36.0)
MCV: 94.9 fL (ref 80.0–100.0)
MPV: 10 fL (ref 7.5–12.5)
Monocytes Relative: 6.6 %
Neutro Abs: 2772 cells/uL (ref 1500–7800)
Neutrophils Relative %: 52.3 %
Platelets: 203 10*3/uL (ref 140–400)
RBC: 4.08 10*6/uL — ABNORMAL LOW (ref 4.20–5.80)
RDW: 13.9 % (ref 11.0–15.0)
Total Lymphocyte: 33.4 %
WBC: 5.3 10*3/uL (ref 3.8–10.8)

## 2019-06-23 LAB — HEMOGLOBIN A1C
Hgb A1c MFr Bld: 5.1 % of total Hgb (ref <5.7)
Mean Plasma Glucose: 100 (calc)
eAG (mmol/L): 5.5 (calc)

## 2019-06-23 LAB — COMPLETE METABOLIC PANEL WITH GFR
AG Ratio: 1.9 (calc) (ref 1.0–2.5)
ALT: 37 U/L (ref 9–46)
AST: 50 U/L — ABNORMAL HIGH (ref 10–35)
Albumin: 4.2 g/dL (ref 3.6–5.1)
Alkaline phosphatase (APISO): 73 U/L (ref 35–144)
BUN: 17 mg/dL (ref 7–25)
CO2: 31 mmol/L (ref 20–32)
Calcium: 9.6 mg/dL (ref 8.6–10.3)
Chloride: 100 mmol/L (ref 98–110)
Creat: 0.99 mg/dL (ref 0.70–1.18)
GFR, Est African American: 87 mL/min/{1.73_m2} (ref >=60)
GFR, Est Non African American: 75 mL/min/{1.73_m2} (ref >=60)
Globulin: 2.2 g/dL (calc) (ref 1.9–3.7)
Glucose, Bld: 83 mg/dL (ref 65–99)
Potassium: 4.3 mmol/L (ref 3.5–5.3)
Sodium: 139 mmol/L (ref 135–146)
Total Bilirubin: 0.9 mg/dL (ref 0.2–1.2)
Total Protein: 6.4 g/dL (ref 6.1–8.1)

## 2019-06-23 LAB — PSA: PSA: 0.8 ng/mL (ref <=4.0)

## 2019-06-23 LAB — URIC ACID: Uric Acid, Serum: 7.2 mg/dL (ref 4.0–8.0)

## 2019-06-23 LAB — TSH: TSH: 3.28 mIU/L (ref 0.40–4.50)

## 2019-06-26 ENCOUNTER — Encounter: Payer: Self-pay | Admitting: Family Medicine

## 2019-06-26 ENCOUNTER — Ambulatory Visit (INDEPENDENT_AMBULATORY_CARE_PROVIDER_SITE_OTHER): Payer: PPO | Admitting: Family Medicine

## 2019-06-26 ENCOUNTER — Other Ambulatory Visit: Payer: Self-pay

## 2019-06-26 VITALS — BP 127/63 | HR 80 | Temp 99.1°F | Ht 68.0 in | Wt 221.6 lb

## 2019-06-26 DIAGNOSIS — N182 Chronic kidney disease, stage 2 (mild): Secondary | ICD-10-CM

## 2019-06-26 DIAGNOSIS — M25561 Pain in right knee: Secondary | ICD-10-CM | POA: Diagnosis not present

## 2019-06-26 DIAGNOSIS — M25562 Pain in left knee: Secondary | ICD-10-CM

## 2019-06-26 DIAGNOSIS — E785 Hyperlipidemia, unspecified: Secondary | ICD-10-CM

## 2019-06-26 DIAGNOSIS — G8929 Other chronic pain: Secondary | ICD-10-CM

## 2019-06-26 DIAGNOSIS — E1169 Type 2 diabetes mellitus with other specified complication: Secondary | ICD-10-CM

## 2019-06-26 DIAGNOSIS — E669 Obesity, unspecified: Secondary | ICD-10-CM

## 2019-06-26 DIAGNOSIS — N401 Enlarged prostate with lower urinary tract symptoms: Secondary | ICD-10-CM

## 2019-06-26 DIAGNOSIS — M1A0790 Idiopathic chronic gout, unspecified ankle and foot, without tophus (tophi): Secondary | ICD-10-CM

## 2019-06-26 DIAGNOSIS — E1121 Type 2 diabetes mellitus with diabetic nephropathy: Secondary | ICD-10-CM

## 2019-06-26 DIAGNOSIS — M8949 Other hypertrophic osteoarthropathy, multiple sites: Secondary | ICD-10-CM | POA: Diagnosis not present

## 2019-06-26 DIAGNOSIS — G6281 Critical illness polyneuropathy: Secondary | ICD-10-CM | POA: Diagnosis not present

## 2019-06-26 DIAGNOSIS — I129 Hypertensive chronic kidney disease with stage 1 through stage 4 chronic kidney disease, or unspecified chronic kidney disease: Secondary | ICD-10-CM | POA: Diagnosis not present

## 2019-06-26 DIAGNOSIS — N138 Other obstructive and reflux uropathy: Secondary | ICD-10-CM | POA: Diagnosis not present

## 2019-06-26 DIAGNOSIS — M159 Polyosteoarthritis, unspecified: Secondary | ICD-10-CM

## 2019-06-26 MED ORDER — BACLOFEN 10 MG PO TABS: 5.0000 mg | ORAL_TABLET | Freq: Three times a day (TID) | ORAL | 1 refills | Status: DC | PRN

## 2019-06-26 MED ORDER — LIDOCAINE HCL (PF) 1 % IJ SOLN
4.0000 mL | Freq: Once | INTRAMUSCULAR | Status: AC
  Administered 2019-06-26: 4 mL

## 2019-06-26 MED ORDER — METHYLPREDNISOLONE ACETATE 40 MG/ML IJ SUSP
40.0000 mg | Freq: Once | INTRAMUSCULAR | Status: AC
  Administered 2019-06-26: 40 mg via INTRA_ARTICULAR

## 2019-06-26 NOTE — Patient Instructions (Addendum)
Thank you for coming to the office today.  Blood work looks great.  Gout is controlled Uric Acid is in 7 range. Normal.  STOP Colchicine 0.6mg  for now, instead of taking daily you can switch to ONLY TAKE THIS IF NEEDED FOR GOUT FLARE  Keep taking Allopurinol 100mg  daily  Use Diclofenac topical on knees as needed for pain and stiffness  Start taking Baclofen (Lioresal) 10mg  (muscle relaxant) - start with half (cut) to one whole pill at night as needed for next 1-3 nights (may make you drowsy, caution with driving) see how it affects you, then if tolerated increase to one pill 2 to 3 times a day or (every 8 hours as needed)  Knee injection steroid in Left knee - soonest we can repeat is 3 months.  Kidney function has improved.   Recent Labs    12/21/18 0817 06/22/19 1000  HGBA1C 5.7* 5.1    Please schedule a Follow-up Appointment to: Return in about 6 months (around 12/26/2019) for 6 month follow-up Arthritis / Gout, HTN, DM2.  If you have any other questions or concerns, please feel free to call the office or send a message through MyChart. You may also schedule an earlier appointment if necessary.  Additionally, you may be receiving a survey about your experience at our office within a few days to 1 week by e-mail or mail. We value your feedback.  08/22/19, DO [facility], [name]

## 2019-06-26 NOTE — Assessment & Plan Note (Signed)
Well-controlled HTN CKD II, improved from III Off Amlodipine   Plan:  1. Remain off HTN therapy, has lasix PRN edema 2. Encourage improved lifestyle - low sodium diet, regular exercise 3. Start monitor BP outside office, bring readings to next visit, if persistently >140/90 or new symptoms notify office sooner

## 2019-06-26 NOTE — Assessment & Plan Note (Signed)
No flare up Stable chronic problem since 2017 Previously followed by Neurology On alpha lipoic acid supplement, some improvement

## 2019-06-26 NOTE — Assessment & Plan Note (Signed)
Improved without flare recently Uric acid down to 7.2 On allopurinol 100mg  daily Previously on Colchicine 0.6mg  daily - advised him he can DC this for now, use only PRN flares.

## 2019-06-26 NOTE — Assessment & Plan Note (Signed)
Well-controlled DM with A1c 5.1 Complications - CKD-II, peripheral neuropathy (not necessarily 2/2 DM - with critical illness neuropathy)  Plan:  1. Not on therapy 2. Encourage improved lifestyle - low carb, low sugar diet, reduce portion size, start regular exercise 3. On Statin

## 2019-06-26 NOTE — Progress Notes (Signed)
Subjective:    Patient ID: Danny Patterson, male    DOB: [birth year], 74 y.o.   MRN: [id number]  Danny Patterson is a 74 y.o. 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 Not interested in other procedural intervention. He is asking about refill on Baclofen for back spasm.  BPH Nocturia, urinary frequency He can have some good urinary flow at times. Taking Superbeta Prostate with good results Previously with nephrolithiasis he was on K Citrate from Urologist, but stopped this didn't follow up  He gained and then lost again. Overall weight down about 20 lbs in 1 year.  CHRONIC HTN / CKD-III He remains off BP medication. He does take Lasix for fluid PRN none recently Off amlodipine Last lab showed improved Creatinine Current Meds -Off Amlodipine Denies CP, dyspnea, HA, edema, dizziness / lightheadedness  CHRONIC DM, Type 2 / nephropathy Last A1c 5.1, very well controlled. Likely with wt loss lifestyle changes Not on acei arb Denies hypoglycemia, polyuria, visual changes, numbness or tingling.  Elevated LFT Mild elevated AST to 50. He does drink a few beers most evening.  HYPERLIPIDEMIA: - Reports no concerns. Last lipid panel 06/2019,well controlled LDL down from 117 down to 61 Controlled on Atorvastatin 20mg  daily, tolerating well  Additional complaint  Chronic Knee Pain Osteoarthritis / History of Chronic Gout Known history of recurrent knee pain. Previous gout flares episodic, seems improved. Today he reports recent worsening L knee pain and stiffness. He has known arthritis. In past using Diclofenac topical PRN some relief. He is interested in steroid  injection. - Previously has seen Ortho / Rheumatology - he is on Allopurinol 100mg  daily for gout, his uric acid has improved into 7 range - he is also taking Colchicine 0.6mg  daily for prevention, was not sure if he should keep taking this one   Depression screen [facility] 2/9 06/26/2019 12/19/2018 09/27/2018  Decreased Interest 0 0 0  Down, Depressed, Hopeless 0 0 0  PHQ - 2 Score 0 0 0    Social History   Tobacco Use  . Smoking status: Former Smoker    Types: Cigarettes  . Smokeless tobacco: Former Network engineer Use Topics  . Alcohol use: Not Currently    Alcohol/week: 3.0 standard drinks    Types: 3 Cans of beer per week    Comment: 05/04/16 3 beers daily  . Drug use: No    Review of Systems Per HPI unless specifically indicated above     Objective:    BP 127/63   Pulse 80   Temp 99.1 F (37.3 C) (Temporal)   Ht 5\' 8"  (1.727 m)   Wt 221 lb 9.6 oz (100.5 kg)   SpO2 97%   BMI 33.69 kg/m   Wt Readings from Last 3 Encounters:  06/26/19 221 lb 9.6 oz (100.5 kg)  12/28/18 227 lb (103 kg)  09/27/18 247 lb (112 kg)    Physical Exam Vitals and nursing note reviewed.  Constitutional:      General: He is not in acute distress.    Appearance: He is well-developed. He is not diaphoretic.     Comments: Well-appearing, comfortable, cooperative  HENT:     Head: Normocephalic and atraumatic.  Eyes:     General:        Right eye: No discharge.        Left eye: No discharge.     Conjunctiva/sclera: Conjunctivae normal.  Neck:     Thyroid: No thyromegaly.  Cardiovascular:     Rate and Rhythm: Normal rate and regular rhythm.     Heart sounds: Normal heart sounds. No murmur.  Pulmonary:     Effort: Pulmonary effort is normal. No respiratory distress.     Breath sounds: Normal breath sounds. No wheezing or rales.  Musculoskeletal:     Cervical back: Normal range of motion and neck supple.     Comments: Left knee Slight bulky appearance. No other deformity. No edema or  effusion. No ecchymosis or erythema. Moderate fine crepitus on exam flex/ext. Has good range of motion. Mild tender joint line.  Lymphadenopathy:     Cervical: No cervical adenopathy.  Skin:    General: Skin is warm and dry.     Findings: No erythema or rash.  Neurological:     Mental Status: He is alert and oriented to person, place, and time.  Psychiatric:        Behavior: Behavior normal.     Comments: Well groomed, good eye contact, normal speech and thoughts      ________________________________________________________ PROCEDURE NOTE Date: 06/26/19 Left knee steroid injection Discussed benefits and risks (including pain, bleeding, infection, steroid flare). Verbal consent given by patient. Medication:  1 cc Depo-medrol 40mg  and 4 cc Lidocaine 1% without epi Time Out taken  Landmarks identified. Area cleansed with alcohol wipes. Using 21 gauge and 1, 1/2 inch needle, Left knee joint space was injected (with above listed medication) via lateral approach  cold spray used for superficial anesthetic. Sterile bandage placed. Patient tolerated procedure well without bleeding or paresthesias. No complications.   -----------------------------------------  CLINICAL DATA:  Chronic left knee pain since 2017, worse with weight-bearing.  EXAM: LEFT KNEE - COMPLETE 4+ VIEW  COMPARISON:  None.  FINDINGS: No fracture or dislocation. Moderate to severe tricompartmental degenerative change of the knee with joint space loss, articular surface irregularity, subchondral sclerosis and osteophytosis. A small amount of chondrocalcinosis is seen within the lateral joint space. There is is spurring of the tibial spines. Enthesopathic change involving the superior pole of the patella. No joint effusion. Regional soft tissues appear normal.  IMPRESSION: 1. 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 2. Chondrocalcinosis suggestive  of CPPD.   Electronically Signed   By: 2018 M.D.   On: 12/29/2016 15:02  Results for orders placed or performed in visit on 06/22/19  TSH  Result Value Ref Range   TSH 3.28 0.40 - 4.50 mIU/L  COMPLETE METABOLIC PANEL WITH GFR  Result Value Ref Range   Glucose, Bld 83 65 - 99 mg/dL   BUN 17 7 - 25 mg/dL   Creat 08/22/19 2.62 - 0.35 mg/dL   GFR, Est Non African American 75 > OR = 60 mL/min/1.63m2   GFR, Est African American 87 > OR = 60 mL/min/1.49m2   BUN/Creatinine Ratio NOT APPLICABLE 6 - 22 (calc)   Sodium 139 135 - 146 mmol/L   Potassium 4.3 3.5 - 5.3 mmol/L   Chloride 100 98 - 110 mmol/L   CO2 31 20 - 32 mmol/L   Calcium 9.6 8.6 - 10.3 mg/dL   Total Protein 6.4 6.1 - 8.1 g/dL   Albumin 4.2 3.6 - 5.1 g/dL  Globulin 2.2 1.9 - 3.7 g/dL (calc)   AG Ratio 1.9 1.0 - 2.5 (calc)   Total Bilirubin 0.9 0.2 - 1.2 mg/dL   Alkaline phosphatase (APISO) 73 35 - 144 U/L   AST 50 (H) 10 - 35 U/L   ALT 37 9 - 46 U/L  Hemoglobin A1c  Result Value Ref Range   Hgb A1c MFr Bld 5.1 <5.7 % of total Hgb   Mean Plasma Glucose 100 (calc)   eAG (mmol/L) 5.5 (calc)  Uric acid  Result Value Ref Range   Uric Acid, Serum 7.2 4.0 - 8.0 mg/dL  PSA  Result Value Ref Range   PSA 0.8 < OR = 4.0 ng/mL  CBC with Differential/Platelet  Result Value Ref Range   WBC 5.3 3.8 - 10.8 Thousand/uL   RBC 4.08 (L) 4.20 - 5.80 Million/uL   Hemoglobin 13.3 13.2 - 17.1 g/dL   HCT 11.5 72.6 - 20.3 %   MCV 94.9 80.0 - 100.0 fL   MCH 32.6 27.0 - 33.0 pg   MCHC 34.4 32.0 - 36.0 g/dL   RDW 55.9 74.1 - 63.8 %   Platelets 203 140 - 400 Thousand/uL   MPV 10.0 7.5 - 12.5 fL   Neutro Abs 2,772 1,500 - 7,800 cells/uL   Lymphs Abs 1,770 850 - 3,900 cells/uL   Absolute Monocytes 350 200 - 950 cells/uL   Eosinophils Absolute 339 15 - 500 cells/uL   Basophils Absolute 69 0 - 200 cells/uL   Neutrophils Relative % 52.3 %   Total Lymphocyte 33.4 %   Monocytes Relative 6.6 %   Eosinophils Relative 6.4 %    Basophils Relative 1.3 %  Lipid panel  Result Value Ref Range   Cholesterol 131 <200 mg/dL   HDL 57 > OR = 40 mg/dL   Triglycerides 52 <453 mg/dL   LDL Cholesterol (Calc) 61 mg/dL (calc)   Total CHOL/HDL Ratio 2.3 <5.0 (calc)   Non-HDL Cholesterol (Calc) 74 <646 mg/dL (calc)      Assessment & Plan:   Problem List Items Addressed This Visit    Type 2 diabetes with nephropathy (HCC)    Well-controlled DM with A1c 5.1 Complications - CKD-II, peripheral neuropathy (not necessarily 2/2 DM - with critical illness neuropathy)  Plan:  1. Not on therapy 2. Encourage improved lifestyle - low carb, low sugar diet, reduce portion size, start regular exercise 3. On Statin       Osteoarthritis of multiple joints   Relevant Medications   baclofen (LIORESAL) 10 MG tablet   Obesity (BMI 30.0-34.9) - Primary   Hyperlipidemia associated with type 2 diabetes mellitus (HCC)    Controlled cholesterol on statin and lifestyle wt loss Last lipid panel 06/2019  Plan: 1. Continue current meds - Atorvastatin 20mg  2. Encourage improved lifestyle - low carb/cholesterol, reduce portion size, continue improving regular exercise      Gout    Improved without flare recently Uric acid down to 7.2 On allopurinol 100mg  daily Previously on Colchicine 0.6mg  daily - advised him he can DC this for now, use only PRN flares.      Critical illness neuropathy (HCC)    No flare up Stable chronic problem since 2017 Previously followed by Neurology On alpha lipoic acid supplement, some improvement      Relevant Medications   baclofen (LIORESAL) 10 MG tablet   BPH with obstruction/lower urinary tract symptoms    Chronic BPH, mild improved seems stable No alpha blocker - Last PSA 0.8 (06/2019) -  No known personal/family history of prostate CA  Plan: May continue prostate supplement saw palmetto Follow up if worsening or new concerns      Bilateral chronic knee pain   Relevant Medications   baclofen  (LIORESAL) 10 MG tablet   Benign hypertension with CKD (chronic kidney disease), stage II    Well-controlled HTN CKD II, improved from III Off Amlodipine   Plan:  1. Remain off HTN therapy, has lasix PRN edema 2. Encourage improved lifestyle - low sodium diet, regular exercise 3. Start monitor BP outside office, bring readings to next visit, if persistently >140/90 or new symptoms notify office sooner         Subacute on chronic L generalized Knee pain and stiffness without known injury or trauma Known knee OA/DJD and gout - Able to bear weight, no knee instability, mechanical locking - No prior history of knee surgery, arthroscopy - Inadequate conservative therapy   Plan: 1. Proceed with L knee joint steroid injection, see procedure note, tolerated well 2. RICE therapy (rest, ice, compression, elevation) for swelling, activity modification Future can reconsider x-rays, refer back to rheum/ortho  May repeat injection within 3 months if need and including other knee     Meds ordered this encounter  Medications  . lidocaine (PF) (XYLOCAINE) 1 % injection 4 mL  . methylPREDNISolone acetate (DEPO-MEDROL) injection 40 mg  . baclofen (LIORESAL) 10 MG tablet    Sig: Take 0.5-1 tablets (5-10 mg total) by mouth 3 (three) times daily as needed for muscle spasms.    Dispense:  30 each    Refill:  1     Follow up plan: Return in about 6 months (around 12/26/2019) for 6 month follow-up Arthritis / Gout, HTN, DM2.   [name], DO [facility] 06/26/2019, 2:22 PM

## 2019-06-26 NOTE — Assessment & Plan Note (Signed)
Chronic BPH, mild improved seems stable No alpha blocker - Last PSA 0.8 (06/2019) - No known personal/family history of prostate CA  Plan: May continue prostate supplement saw palmetto Follow up if worsening or new concerns 

## 2019-06-26 NOTE — Assessment & Plan Note (Signed)
Controlled cholesterol on statin and lifestyle wt loss Last lipid panel 06/2019  Plan: 1. Continue current meds - Atorvastatin 20mg  2. Encourage improved lifestyle - low carb/cholesterol, reduce portion size, continue improving regular exercise

## 2019-07-04 ENCOUNTER — Ambulatory Visit: Payer: PPO

## 2019-07-18 ENCOUNTER — Ambulatory Visit: Payer: PPO | Attending: Internal Medicine

## 2019-07-18 ENCOUNTER — Other Ambulatory Visit: Payer: Self-pay | Admitting: Family Medicine

## 2019-07-18 DIAGNOSIS — Z23 Encounter for immunization: Secondary | ICD-10-CM

## 2019-07-18 DIAGNOSIS — R600 Localized edema: Secondary | ICD-10-CM

## 2019-07-18 NOTE — Progress Notes (Signed)
   Covid-19 Vaccination Clinic  Name:  Danny Patterson    MRN: [id number] DOB: 11-16-1945  07/18/2019  Danny Patterson was observed post Covid-19 immunization for 15 minutes without incident. He was provided with Vaccine Information Sheet and instruction to access the V-Safe system.   Danny Patterson was instructed to call 911 with any severe reactions post vaccine: Marland Kitchen Difficulty breathing  . Swelling of face and throat  . A fast heartbeat  . A bad rash all over body  . Dizziness and weakness   Immunizations Administered    Name Date Dose VIS Date Route   Pfizer COVID-19 Vaccine 07/18/2019  2:54 PM 0.3 mL 05/16/2018 Intramuscular   Manufacturer: [employer], [employer]   Lot: [id number]   NDC: [address]-0

## 2019-09-05 ENCOUNTER — Other Ambulatory Visit: Payer: Self-pay | Admitting: Family Medicine

## 2019-09-05 DIAGNOSIS — G8929 Other chronic pain: Secondary | ICD-10-CM

## 2019-09-05 DIAGNOSIS — M159 Polyosteoarthritis, unspecified: Secondary | ICD-10-CM

## 2019-09-05 MED ORDER — BACLOFEN 10 MG PO TABS: 5.0000 mg | ORAL_TABLET | Freq: Three times a day (TID) | ORAL | 2 refills | Status: DC | PRN

## 2019-10-07 ENCOUNTER — Other Ambulatory Visit: Payer: Self-pay | Admitting: Family Medicine

## 2019-10-07 DIAGNOSIS — I10 Essential (primary) hypertension: Secondary | ICD-10-CM

## 2019-10-16 ENCOUNTER — Encounter: Payer: Self-pay | Admitting: Family Medicine

## 2019-10-18 ENCOUNTER — Other Ambulatory Visit: Payer: Self-pay | Admitting: Family Medicine

## 2019-10-18 DIAGNOSIS — E782 Mixed hyperlipidemia: Secondary | ICD-10-CM

## 2019-10-30 ENCOUNTER — Other Ambulatory Visit: Payer: Self-pay | Admitting: Family Medicine

## 2019-10-30 DIAGNOSIS — M159 Polyosteoarthritis, unspecified: Secondary | ICD-10-CM

## 2019-11-07 ENCOUNTER — Other Ambulatory Visit: Payer: Self-pay | Admitting: Family Medicine

## 2019-11-07 DIAGNOSIS — M1A0790 Idiopathic chronic gout, unspecified ankle and foot, without tophus (tophi): Secondary | ICD-10-CM

## 2019-11-07 DIAGNOSIS — E790 Hyperuricemia without signs of inflammatory arthritis and tophaceous disease: Secondary | ICD-10-CM

## 2019-11-07 NOTE — Telephone Encounter (Signed)
Requested Prescriptions  Pending Prescriptions Disp Refills   allopurinol (ZYLOPRIM) 100 MG tablet [Pharmacy Med Name: ALLOPURINOL 100 MG TABLET] 90 tablet 0    Sig: TAKE 1 TABLET BY MOUTH EVERY DAY     Endocrinology:  Gout Agents Passed - 11/07/2019  1:53 PM      Passed - Uric Acid in normal range and within 360 days    Uric Acid, Serum  Date Value Ref Range Status  06/22/2019 7.2 4.0 - 8.0 mg/dL Final    Comment:    Therapeutic target for gout patients: <6.0 mg/dL .    Uric Acid  Date Value Ref Range Status  02/26/2015 5.4 3.7 - 8.6 mg/dL Final    Comment:               Therapeutic target for gout patients: <6.0         Passed - Cr in normal range and within 360 days    Creat  Date Value Ref Range Status  06/22/2019 0.99 0.70 - 1.18 mg/dL Final    Comment:    For patients >68 years of age, the reference limit for Creatinine is approximately 13% higher for people identified as African-American. Verna Czech - Valid encounter within last 12 months    Recent Outpatient Visits          4 months ago Obesity (BMI 30.0-34.9)   [facility] Althea Charon, Netta Neat, DO   10 months ago Annual physical exam   [facility] Smitty Cords, DO   10 months ago Essential hypertension   [facility] Smitty Cords, [place]   11 months ago COVID-19 virus infection   [facility] Kyung Rudd, Alison Stalling, NP   1 year ago Benign hypertension with CKD (chronic kidney disease), stage II   [facility] [place], Netta Neat, DO      Future Appointments            In 1 month Althea Charon, Netta Neat, DO [facility], [facility]

## 2019-11-15 ENCOUNTER — Other Ambulatory Visit: Payer: Self-pay | Admitting: Family Medicine

## 2019-11-15 DIAGNOSIS — I10 Essential (primary) hypertension: Secondary | ICD-10-CM

## 2019-12-26 ENCOUNTER — Ambulatory Visit (INDEPENDENT_AMBULATORY_CARE_PROVIDER_SITE_OTHER): Payer: PPO | Admitting: Family Medicine

## 2019-12-26 ENCOUNTER — Encounter: Payer: Self-pay | Admitting: Family Medicine

## 2019-12-26 ENCOUNTER — Other Ambulatory Visit: Payer: Self-pay

## 2019-12-26 VITALS — BP 162/70 | HR 58 | Temp 97.3°F | Resp 16 | Ht 68.0 in | Wt 202.0 lb

## 2019-12-26 DIAGNOSIS — E1121 Type 2 diabetes mellitus with diabetic nephropathy: Secondary | ICD-10-CM | POA: Diagnosis not present

## 2019-12-26 DIAGNOSIS — E790 Hyperuricemia without signs of inflammatory arthritis and tophaceous disease: Secondary | ICD-10-CM | POA: Diagnosis not present

## 2019-12-26 DIAGNOSIS — M8949 Other hypertrophic osteoarthropathy, multiple sites: Secondary | ICD-10-CM | POA: Diagnosis not present

## 2019-12-26 DIAGNOSIS — N401 Enlarged prostate with lower urinary tract symptoms: Secondary | ICD-10-CM | POA: Diagnosis not present

## 2019-12-26 DIAGNOSIS — M25562 Pain in left knee: Secondary | ICD-10-CM

## 2019-12-26 DIAGNOSIS — R600 Localized edema: Secondary | ICD-10-CM | POA: Diagnosis not present

## 2019-12-26 DIAGNOSIS — N182 Chronic kidney disease, stage 2 (mild): Secondary | ICD-10-CM | POA: Diagnosis not present

## 2019-12-26 DIAGNOSIS — M1A0790 Idiopathic chronic gout, unspecified ankle and foot, without tophus (tophi): Secondary | ICD-10-CM

## 2019-12-26 DIAGNOSIS — M25561 Pain in right knee: Secondary | ICD-10-CM | POA: Diagnosis not present

## 2019-12-26 DIAGNOSIS — G8929 Other chronic pain: Secondary | ICD-10-CM

## 2019-12-26 DIAGNOSIS — E1169 Type 2 diabetes mellitus with other specified complication: Secondary | ICD-10-CM | POA: Diagnosis not present

## 2019-12-26 DIAGNOSIS — I129 Hypertensive chronic kidney disease with stage 1 through stage 4 chronic kidney disease, or unspecified chronic kidney disease: Secondary | ICD-10-CM

## 2019-12-26 DIAGNOSIS — N138 Other obstructive and reflux uropathy: Secondary | ICD-10-CM

## 2019-12-26 DIAGNOSIS — M159 Polyosteoarthritis, unspecified: Secondary | ICD-10-CM

## 2019-12-26 LAB — POCT GLYCOSYLATED HEMOGLOBIN (HGB A1C): Hemoglobin A1C: 4.4 % (ref 4.0–5.6)

## 2019-12-26 MED ORDER — BACLOFEN 10 MG PO TABS: 5.0000 mg | ORAL_TABLET | Freq: Three times a day (TID) | ORAL | 5 refills | Status: DC | PRN

## 2019-12-26 MED ORDER — AMLODIPINE BESYLATE 5 MG PO TABS: 5.0000 mg | ORAL_TABLET | Freq: Every day | ORAL | 3 refills | Status: DC

## 2019-12-26 MED ORDER — FUROSEMIDE 20 MG PO TABS: 20.0000 mg | ORAL_TABLET | Freq: Every day | ORAL | 1 refills | Status: DC | PRN

## 2019-12-26 MED ORDER — ALLOPURINOL 100 MG PO TABS: 100.0000 mg | ORAL_TABLET | Freq: Every day | ORAL | 1 refills | Status: DC

## 2019-12-26 NOTE — Patient Instructions (Addendum)
Thank you for coming to the office today.  A1c sugar today, we can call you later today or tomorrow with result.  BP is still elevated.  We can resume the Amlodipine 5mg  daily again. Try to keep track of this and monitor BP - It may cause some mild swelling of feet ankles - Try to keep active and elevate legs if swollen  Your provider would like to you have your annual eye exam. Please contact your current eye doctor or here are some good options for you to contact.   [facility]   Address: [address] [place], [place] Phone: [phone]  Website: [email or web address]   [facility] [address], [place], [place] Phone: [phone] [email or web address]  [facility]  Address: [address] [place], [place], [place] Phone: [phone]   [facility] [address] [place], [place] Phone: [phone]  [facility] Address: [address] [place], [place], [place]  Phone: [phone]  DUE for FASTING BLOOD WORK (no food or drink after midnight before the lab appointment, only water or coffee without cream/sugar on the morning of)  SCHEDULE "Lab Only" visit in the morning at the clinic for lab draw in 6 MONTHS   - Make sure Lab Only appointment is at about 1 week before your next appointment, so that results will be available  For Lab Results, once available within 2-3 days of blood draw, you can can log in to MyChart online to view your results and a brief explanation. Also, we can discuss results at next follow-up visit.    Please schedule a Follow-up Appointment to: Return in about 6 months (around 06/25/2020) for 6 month fasting lab only then 1 week later Annual Physical.  If you have any other questions or concerns, please feel free to call the office or send a message through MyChart. You may also schedule an earlier appointment if necessary.  Additionally, you may be receiving a survey about your  experience at our office within a few days to 1 week by e-mail or mail. We value your feedback.  08/25/2020, DO [facility], [facility]

## 2019-12-26 NOTE — Progress Notes (Signed)
Subjective:    Patient ID: Danny Patterson, male    DOB: June 19, 1945, 74 y.o.   MRN: [id number]  Danny Patterson is a 74 y.o. 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 Not interested in other procedural intervention. He is asking about refill on Baclofen for back spasm.  BPH Nocturia, urinary frequency He can have some good urinary flow at times. Taking Superbeta Prostate with good results Previously with nephrolithiasis he was on K Citrate from Urologist, but stopped this didn't follow up  CHRONIC HTN/ CKD-III He was on BP med, he believes amlodipine, he said it was 5 or , had both, says they were combined into one bottle, then he finished entire bottle. THere is discrepancy with med rec, unsure when he was on medication - Home BP readings 130-140, on average, higher BP here He does take Lasix for fluid PRN none recently Off amlodipine currently now elevated BP Denies CP, dyspnea, HA, edema, dizziness / lightheadedness  CHRONIC DM, Type 2/ nephropathy Last A1c 5.1, very well controlled. Likely with wt loss lifestyle changes Not on acei arb Denies hypoglycemia, polyuria, visual changes, numbness or tingling.   Additional complaint  Chronic Knee Pain Osteoarthritis / History of Chronic Gout Known history of recurrent knee pain. Previous gout flares episodic, seems improved. Today he reports recent worsening L knee pain and stiffness. He has known arthritis. In past using Diclofenac topical PRN some relief. He is interested in steroid injection. - Previously has seen Ortho / Rheumatology - he is on Allopurinol  daily for gout, his uric acid has improved into 7  range - he is also taking Colchicine 0.6mg  daily for prevention    Health Maintenance: Declines flu  Depression screen [facility] 2/9 12/26/2019 06/26/2019 12/19/2018  Decreased Interest 0 0 0  Down, Depressed, Hopeless 0 0 0  PHQ - 2 Score 0 0 0    Past Medical History:  Diagnosis Date  . Allergy   . Arthritis   . Gout   . Hyperlipidemia   . Hyperlipidemia   . Hypertension   . Kidney stone   . Rheumatic fever    Past Surgical History:  Procedure Laterality Date  . BACK SURGERY    . knot     removed from neck  . POSTERIOR LUMBAR FUSION 4 LEVEL N/A 10/01/2015   Procedure: Lumbar three-four,  Lumbar four-five,  Lumbar five-Sacrum one posterior lumbar interbody fusion with Laminotomy at Lumbar Two-Three;  Surgeon: Hilda Lias, MD;  Location: [facility];  Service: Neurosurgery;  Laterality: N/A;   Social History   Socioeconomic History  . Marital status: Married    Spouse name: Patty  . Number of children: 3  . Years of education: 74  . Highest education level: Not on file  Occupational History    Comment: self employed  Tobacco Use  . Smoking status: Former Smoker    Types: Cigarettes  . Smokeless tobacco: Former Engineer, water and Sexual Activity  . Alcohol use: Not Currently    Alcohol/week: 3.0 standard drinks    Types: 3 Cans of beer per week    Comment: 05/04/16 3 beers daily  . Drug use: No  . Sexual activity: Not on  file  Other Topics Concern  . Not on file  Social History Narrative   Lives with wife   Caffeine- coffee, 2 cups daily   Social Determinants of Health   Financial Resource Strain:   . Difficulty of Paying Living Expenses: Not on file  Food Insecurity:   . Worried About Programme researcher, broadcasting/film/video in the Last Year: Not on file  . Ran Out of Food in the Last Year: Not on file  Transportation Needs:   . Lack of Transportation (Medical): Not on file  . Lack of Transportation (Non-Medical): Not on file  Physical Activity:   . Days of Exercise per  Week: Not on file  . Minutes of Exercise per Session: Not on file  Stress:   . Feeling of Stress : Not on file  Social Connections:   . Frequency of Communication with Friends and Family: Not on file  . Frequency of Social Gatherings with Friends and Family: Not on file  . Attends Religious Services: Not on file  . Active Member of Clubs or Organizations: Not on file  . Attends Banker Meetings: Not on file  . Marital Status: Not on file  Intimate Partner Violence:   . Fear of Current or Ex-Partner: Not on file  . Emotionally Abused: Not on file  . Physically Abused: Not on file  . Sexually Abused: Not on file   Family History  Problem Relation Age of Onset  . Parkinson's disease Father 1  . Kidney disease Maternal Grandmother   . Hypertension Sister   . Kidney cancer Neg Hx   . Prostate cancer Neg Hx    Current Outpatient Medications on File Prior to Visit  Medication Sig  . acetaminophen (TYLENOL) 500 MG tablet Take 2 tablets (1,000 mg total) by mouth every 8 (eight) hours as needed.  Marland Kitchen atorvastatin (LIPITOR) 20 MG tablet TAKE 1 TABLET (20 MG TOTAL) BY MOUTH DAILY AT 6 PM.  . cholecalciferol (VITAMIN D) 1000 units tablet Take 1,000 Units daily by mouth.  . colchicine 0.6 MG tablet FOR GOUT FLARE TAKE 1 PILL THEN REPEAT WITHIN 2 HOURS, THEN MAY TAKE 1 PILL DAILY UNTIL RESOLVED FOR 7-10 DAYS.  Marland Kitchen diclofenac Sodium (VOLTAREN) 1 % GEL APPLY 2 G TOPICALLY 3 (THREE) TIMES DAILY. AS NEEDED FOR HANDS AND KNEE, ARTHRITIS.  . magnesium gluconate (MAGONATE) 500 MG tablet Take 500 mg by mouth daily.  . potassium citrate (UROCIT-K) 10 MEQ (1080 MG) SR tablet Take 10 mEq by mouth 3 (three) times daily with meals.  Marland Kitchen HORSE CHESTNUT PO Take by mouth.   No current facility-administered medications on file prior to visit.    Review of Systems Per HPI unless specifically indicated above      Objective:    BP (!) 162/70 (BP Location: Left Arm, Cuff Size: Normal)   Pulse (!)  58   Temp (!) 97.3 F (36.3 C) (Temporal)   Resp 16   Ht 5\' 8"  (1.727 m)   Wt 202 lb (91.6 kg)   SpO2 100%   BMI 30.71 kg/m   Wt Readings from Last 3 Encounters:  12/26/19 202 lb (91.6 kg)  06/26/19 221 lb 9.6 oz (100.5 kg)  12/28/18 227 lb (103 kg)    Physical Exam Vitals and nursing note reviewed.  Constitutional:      General: He is not in acute distress.    Appearance: He is well-developed. He is not diaphoretic.     Comments: Well-appearing, comfortable, cooperative  HENT:     Head: Normocephalic and atraumatic.  Eyes:     General:        Right eye: No discharge.        Left eye: No discharge.     Conjunctiva/sclera: Conjunctivae normal.  Neck:     Thyroid: No thyromegaly.  Cardiovascular:     Rate and Rhythm: Normal rate and regular rhythm.     Heart sounds: Normal heart sounds. No murmur heard.   Pulmonary:     Effort: Pulmonary effort is normal. No respiratory distress.     Breath sounds: Normal breath sounds. No wheezing or rales.  Musculoskeletal:        General: Normal range of motion.     Cervical back: Normal range of motion and neck supple.     Right lower leg: Edema (trace, +1) present.     Left lower leg: Edema (trace+ 1) present.  Lymphadenopathy:     Cervical: No cervical adenopathy.  Skin:    General: Skin is warm and dry.     Findings: No erythema or rash.  Neurological:     Mental Status: He is alert and oriented to person, place, and time.  Psychiatric:        Behavior: Behavior normal.     Comments: Well groomed, good eye contact, normal speech and thoughts      Diabetic Foot Exam - Simple   Simple Foot Form Diabetic Foot exam was performed with the following findings: Yes 12/26/2019  2:20 PM  Visual Inspection See comments: Yes Sensation Testing Intact to touch and monofilament testing bilaterally: Yes Pulse Check Posterior Tibialis and Dorsalis pulse intact bilaterally: Yes Comments Right great toenail with some ecchymosis under  nail and thickening, mild callus formation bilateral forefoot great toes and heels, otherwise no ulceration      Results for orders placed or performed in visit on 06/22/19  TSH  Result Value Ref Range   TSH 3.28 0.40 - 4.50 mIU/L  COMPLETE METABOLIC PANEL WITH GFR  Result Value Ref Range   Glucose, Bld 83 65 - 99 mg/dL   BUN 17 7 - 25 mg/dL   Creat 1.610.99 0.960.70 - 0.451.18 mg/dL   GFR, Est Non African American 75 > OR = 60 mL/min/1.1773m2   GFR, Est African American 87 > OR = 60 mL/min/1.4073m2   BUN/Creatinine Ratio NOT APPLICABLE 6 - 22 (calc)   Sodium 139 135 - 146 mmol/L   Potassium 4.3 3.5 - 5.3 mmol/L   Chloride 100 98 - 110 mmol/L   CO2 31 20 - 32 mmol/L   Calcium 9.6 8.6 - 10.3 mg/dL   Total Protein 6.4 6.1 - 8.1 g/dL   Albumin 4.2 3.6 - 5.1 g/dL   Globulin 2.2 1.9 - 3.7 g/dL (calc)   AG Ratio 1.9 1.0 - 2.5 (calc)   Total Bilirubin 0.9 0.2 - 1.2 mg/dL   Alkaline phosphatase (APISO) 73 35 - 144 U/L   AST 50 (H) 10 - 35 U/L   ALT 37 9 - 46 U/L  Hemoglobin A1c  Result Value Ref Range   Hgb A1c MFr Bld 5.1 <5.7 % of total Hgb   Mean Plasma Glucose 100 (calc)   eAG (mmol/L) 5.5 (calc)  Uric acid  Result Value Ref Range   Uric Acid, Serum 7.2 4.0 - 8.0 mg/dL  PSA  Result Value Ref Range   PSA 0.8 < OR = 4.0 ng/mL  CBC with Differential/Platelet  Result Value Ref Range  WBC 5.3 3.8 - 10.8 Thousand/uL   RBC 4.08 (L) 4.20 - 5.80 Million/uL   Hemoglobin 13.3 13.2 - 17.1 g/dL   HCT 44.3 38 - 50 %   MCV 94.9 80.0 - 100.0 fL   MCH 32.6 27.0 - 33.0 pg   MCHC 34.4 32.0 - 36.0 g/dL   RDW 15.4 00.8 - 67.6 %   Platelets 203 140 - 400 Thousand/uL   MPV 10.0 7.5 - 12.5 fL   Neutro Abs 2,772 1,500 - 7,800 cells/uL   Lymphs Abs 1,770 850 - 3,900 cells/uL   Absolute Monocytes 350 200 - 950 cells/uL   Eosinophils Absolute 339 15 - 500 cells/uL   Basophils Absolute 69 0 - 200 cells/uL   Neutrophils Relative % 52.3 %   Total Lymphocyte 33.4 %   Monocytes Relative 6.6 %   Eosinophils  Relative 6.4 %   Basophils Relative 1.3 %  Lipid panel  Result Value Ref Range   Cholesterol 131 <200 mg/dL   HDL 57 > OR = 40 mg/dL   Triglycerides 52 <195 mg/dL   LDL Cholesterol (Calc) 61 mg/dL (calc)   Total CHOL/HDL Ratio 2.3 <5.0 (calc)   Non-HDL Cholesterol (Calc) 74 <093 mg/dL (calc)      Assessment & Plan:   Problem List Items Addressed This Visit    Type 2 diabetes with nephropathy (HCC)    Well-controlled DM with A1c 4.4 Complications - CKD-II, peripheral neuropathy (not necessarily 2/2 DM - with critical illness neuropathy)  Plan:  1. Not on therapy 2. Encourage improved lifestyle - low carb, low sugar diet, reduce portion size, start regular exercise 3. On Statin       Osteoarthritis of multiple joints    Stable chronic OA/DJD multiple joints Re order Baclofen      Relevant Medications   allopurinol (ZYLOPRIM) 100 MG tablet   baclofen (LIORESAL) 10 MG tablet   Gout   Relevant Medications   allopurinol (ZYLOPRIM) 100 MG tablet   BPH with obstruction/lower urinary tract symptoms    Chronic BPH, mild improved seems stable No alpha blocker - Last PSA 0.8 (06/2019) - No known personal/family history of prostate CA  Plan: May continue prostate supplement saw palmetto Follow up if worsening or new concerns      Bilateral chronic knee pain   Relevant Medications   baclofen (LIORESAL) 10 MG tablet   Benign hypertension with CKD (chronic kidney disease), stage II - Primary    Elevated BP off amlodipine, uncertain history on dosing/duration CKD II, improved from III   Plan:  1. RE ORDER Amlodipine 5mg  daily, continue lasix 20-40mg  daily PRN 2. Encourage improved lifestyle - low sodium diet, regular exercise 3. monitor BP outside office, bring readings to next visit, if persistently >140/90 or new symptoms notify office sooner      Relevant Medications   amLODipine (NORVASC) 5 MG tablet   furosemide (LASIX) 20 MG tablet    Other Visit Diagnoses     Type 2 diabetes mellitus with other specified complication, without long-term current use of insulin (HCC)       Relevant Orders   POCT glycosylated hemoglobin (Hb A1C) (Completed)   Elevated uric acid in blood       Relevant Medications   allopurinol (ZYLOPRIM) 100 MG tablet   Pedal edema       Relevant Medications   furosemide (LASIX) 20 MG tablet        Meds ordered this encounter  Medications  . amLODipine (NORVASC)  5 MG tablet    Sig: Take 1 tablet (5 mg total) by mouth daily.    Dispense:  90 tablet    Refill:  3  . allopurinol (ZYLOPRIM) 100 MG tablet    Sig: Take 1 tablet (100 mg total) by mouth daily.    Dispense:  90 tablet    Refill:  1  . baclofen (LIORESAL) 10 MG tablet    Sig: Take 0.5-1 tablets (5-10 mg total) by mouth 3 (three) times daily as needed for muscle spasms.    Dispense:  30 each    Refill:  5  . furosemide (LASIX) 20 MG tablet    Sig: Take 1-2 tablets (20-40 mg total) by mouth daily as needed for edema.    Dispense:  180 tablet    Refill:  1     Follow up plan: Return in about 6 months (around 06/25/2020) for 6 month fasting lab only then 1 week later Annual Physical.   Future labs, add uric acid 6 mo  [name], DO [facility] 12/26/2019, 2:21 PM

## 2019-12-27 ENCOUNTER — Other Ambulatory Visit: Payer: Self-pay | Admitting: Family Medicine

## 2019-12-27 DIAGNOSIS — Z0000 Encounter for general adult medical examination without abnormal findings: Secondary | ICD-10-CM

## 2019-12-27 DIAGNOSIS — E1169 Type 2 diabetes mellitus with other specified complication: Secondary | ICD-10-CM

## 2019-12-27 DIAGNOSIS — M1A0790 Idiopathic chronic gout, unspecified ankle and foot, without tophus (tophi): Secondary | ICD-10-CM

## 2019-12-27 DIAGNOSIS — I129 Hypertensive chronic kidney disease with stage 1 through stage 4 chronic kidney disease, or unspecified chronic kidney disease: Secondary | ICD-10-CM

## 2019-12-27 DIAGNOSIS — M159 Polyosteoarthritis, unspecified: Secondary | ICD-10-CM

## 2019-12-27 DIAGNOSIS — N138 Other obstructive and reflux uropathy: Secondary | ICD-10-CM

## 2019-12-27 NOTE — Assessment & Plan Note (Signed)
Well-controlled DM with A1c 4.4 Complications - CKD-II, peripheral neuropathy (not necessarily 2/2 DM - with critical illness neuropathy)  Plan:  1. Not on therapy 2. Encourage improved lifestyle - low carb, low sugar diet, reduce portion size, start regular exercise 3. On Statin

## 2019-12-27 NOTE — Progress Notes (Signed)
Patient's spouse Elease Hashimoto notified.

## 2019-12-27 NOTE — Assessment & Plan Note (Signed)
Chronic BPH, mild improved seems stable No alpha blocker - Last PSA 0.8 (06/2019) - No known personal/family history of prostate CA  Plan: May continue prostate supplement saw palmetto Follow up if worsening or new concerns

## 2019-12-27 NOTE — Assessment & Plan Note (Signed)
Elevated BP off amlodipine, uncertain history on dosing/duration CKD II, improved from III   Plan:  1. RE ORDER Amlodipine 5mg  daily, continue lasix 20-40mg  daily PRN 2. Encourage improved lifestyle - low sodium diet, regular exercise 3. monitor BP outside office, bring readings to next visit, if persistently >140/90 or new symptoms notify office sooner

## 2019-12-27 NOTE — Assessment & Plan Note (Signed)
Stable chronic OA/DJD multiple joints Re order Baclofen

## 2020-05-15 DIAGNOSIS — H2513 Age-related nuclear cataract, bilateral: Secondary | ICD-10-CM | POA: Diagnosis not present

## 2020-06-20 ENCOUNTER — Other Ambulatory Visit: Payer: Self-pay | Admitting: *Deleted

## 2020-06-20 DIAGNOSIS — M1A0790 Idiopathic chronic gout, unspecified ankle and foot, without tophus (tophi): Secondary | ICD-10-CM

## 2020-06-20 DIAGNOSIS — I129 Hypertensive chronic kidney disease with stage 1 through stage 4 chronic kidney disease, or unspecified chronic kidney disease: Secondary | ICD-10-CM

## 2020-06-20 DIAGNOSIS — Z0000 Encounter for general adult medical examination without abnormal findings: Secondary | ICD-10-CM

## 2020-06-20 DIAGNOSIS — N401 Enlarged prostate with lower urinary tract symptoms: Secondary | ICD-10-CM

## 2020-06-20 DIAGNOSIS — E1169 Type 2 diabetes mellitus with other specified complication: Secondary | ICD-10-CM

## 2020-06-23 ENCOUNTER — Other Ambulatory Visit: Payer: PPO

## 2020-06-23 ENCOUNTER — Other Ambulatory Visit: Payer: Self-pay

## 2020-06-23 DIAGNOSIS — E1169 Type 2 diabetes mellitus with other specified complication: Secondary | ICD-10-CM | POA: Diagnosis not present

## 2020-06-23 DIAGNOSIS — I129 Hypertensive chronic kidney disease with stage 1 through stage 4 chronic kidney disease, or unspecified chronic kidney disease: Secondary | ICD-10-CM | POA: Diagnosis not present

## 2020-06-23 DIAGNOSIS — E785 Hyperlipidemia, unspecified: Secondary | ICD-10-CM | POA: Diagnosis not present

## 2020-06-23 DIAGNOSIS — Z0000 Encounter for general adult medical examination without abnormal findings: Secondary | ICD-10-CM | POA: Diagnosis not present

## 2020-06-23 DIAGNOSIS — N138 Other obstructive and reflux uropathy: Secondary | ICD-10-CM | POA: Diagnosis not present

## 2020-06-23 DIAGNOSIS — M1A0790 Idiopathic chronic gout, unspecified ankle and foot, without tophus (tophi): Secondary | ICD-10-CM | POA: Diagnosis not present

## 2020-06-23 DIAGNOSIS — N401 Enlarged prostate with lower urinary tract symptoms: Secondary | ICD-10-CM | POA: Diagnosis not present

## 2020-06-23 DIAGNOSIS — N182 Chronic kidney disease, stage 2 (mild): Secondary | ICD-10-CM | POA: Diagnosis not present

## 2020-06-24 LAB — CBC WITH DIFFERENTIAL/PLATELET
Absolute Monocytes: 336 cells/uL (ref 200–950)
Basophils Absolute: 72 cells/uL (ref 0–200)
Basophils Relative: 1.3 %
Eosinophils Absolute: 297 cells/uL (ref 15–500)
Eosinophils Relative: 5.4 %
HCT: 36.6 % — ABNORMAL LOW (ref 38.5–50.0)
Hemoglobin: 12.3 g/dL — ABNORMAL LOW (ref 13.2–17.1)
Lymphs Abs: 2145 cells/uL (ref 850–3900)
MCH: 32.2 pg (ref 27.0–33.0)
MCHC: 33.6 g/dL (ref 32.0–36.0)
MCV: 95.8 fL (ref 80.0–100.0)
MPV: 10.4 fL (ref 7.5–12.5)
Monocytes Relative: 6.1 %
Neutro Abs: 2651 cells/uL (ref 1500–7800)
Neutrophils Relative %: 48.2 %
Platelets: 150 10*3/uL (ref 140–400)
RBC: 3.82 10*6/uL — ABNORMAL LOW (ref 4.20–5.80)
RDW: 13.2 % (ref 11.0–15.0)
Total Lymphocyte: 39 %
WBC: 5.5 10*3/uL (ref 3.8–10.8)

## 2020-06-24 LAB — COMPLETE METABOLIC PANEL WITH GFR
AG Ratio: 2 (calc) (ref 1.0–2.5)
ALT: 22 U/L (ref 9–46)
AST: 43 U/L — ABNORMAL HIGH (ref 10–35)
Albumin: 4.3 g/dL (ref 3.6–5.1)
Alkaline phosphatase (APISO): 79 U/L (ref 35–144)
BUN: 18 mg/dL (ref 7–25)
CO2: 31 mmol/L (ref 20–32)
Calcium: 9.7 mg/dL (ref 8.6–10.3)
Chloride: 101 mmol/L (ref 98–110)
Creat: 1.14 mg/dL (ref 0.70–1.18)
GFR, Est African American: 73 mL/min/{1.73_m2} (ref >=60)
GFR, Est Non African American: 63 mL/min/{1.73_m2} (ref >=60)
Globulin: 2.2 g/dL (calc) (ref 1.9–3.7)
Glucose, Bld: 76 mg/dL (ref 65–99)
Potassium: 4.7 mmol/L (ref 3.5–5.3)
Sodium: 142 mmol/L (ref 135–146)
Total Bilirubin: 0.6 mg/dL (ref 0.2–1.2)
Total Protein: 6.5 g/dL (ref 6.1–8.1)

## 2020-06-24 LAB — LIPID PANEL
Cholesterol: 133 mg/dL (ref <200)
HDL: 74 mg/dL (ref >=40)
LDL Cholesterol (Calc): 45 mg/dL (calc)
Non-HDL Cholesterol (Calc): 59 mg/dL (calc) (ref <130)
Total CHOL/HDL Ratio: 1.8 (calc) (ref <5.0)
Triglycerides: 56 mg/dL (ref <150)

## 2020-06-24 LAB — HEMOGLOBIN A1C
Hgb A1c MFr Bld: 4.6 % of total Hgb (ref <5.7)
Mean Plasma Glucose: 85 mg/dL
eAG (mmol/L): 4.7 mmol/L

## 2020-06-24 LAB — TSH: TSH: 3.04 mIU/L (ref 0.40–4.50)

## 2020-06-24 LAB — PSA: PSA: 0.77 ng/mL (ref <=4.0)

## 2020-06-24 LAB — URIC ACID: Uric Acid, Serum: 7.5 mg/dL (ref 4.0–8.0)

## 2020-06-30 ENCOUNTER — Ambulatory Visit (INDEPENDENT_AMBULATORY_CARE_PROVIDER_SITE_OTHER): Payer: PPO | Admitting: Family Medicine

## 2020-06-30 ENCOUNTER — Other Ambulatory Visit: Payer: Self-pay

## 2020-06-30 ENCOUNTER — Encounter: Payer: Self-pay | Admitting: Family Medicine

## 2020-06-30 VITALS — BP 136/80 | HR 69 | Ht 70.0 in | Wt 208.4 lb

## 2020-06-30 DIAGNOSIS — M1A0790 Idiopathic chronic gout, unspecified ankle and foot, without tophus (tophi): Secondary | ICD-10-CM | POA: Diagnosis not present

## 2020-06-30 DIAGNOSIS — E790 Hyperuricemia without signs of inflammatory arthritis and tophaceous disease: Secondary | ICD-10-CM | POA: Diagnosis not present

## 2020-06-30 DIAGNOSIS — N138 Other obstructive and reflux uropathy: Secondary | ICD-10-CM | POA: Diagnosis not present

## 2020-06-30 DIAGNOSIS — Z6829 Body mass index (BMI) 29.0-29.9, adult: Secondary | ICD-10-CM

## 2020-06-30 DIAGNOSIS — N182 Chronic kidney disease, stage 2 (mild): Secondary | ICD-10-CM | POA: Diagnosis not present

## 2020-06-30 DIAGNOSIS — E1169 Type 2 diabetes mellitus with other specified complication: Secondary | ICD-10-CM

## 2020-06-30 DIAGNOSIS — N401 Enlarged prostate with lower urinary tract symptoms: Secondary | ICD-10-CM

## 2020-06-30 DIAGNOSIS — I129 Hypertensive chronic kidney disease with stage 1 through stage 4 chronic kidney disease, or unspecified chronic kidney disease: Secondary | ICD-10-CM | POA: Diagnosis not present

## 2020-06-30 DIAGNOSIS — E1121 Type 2 diabetes mellitus with diabetic nephropathy: Secondary | ICD-10-CM | POA: Diagnosis not present

## 2020-06-30 DIAGNOSIS — Z0000 Encounter for general adult medical examination without abnormal findings: Secondary | ICD-10-CM | POA: Diagnosis not present

## 2020-06-30 DIAGNOSIS — E785 Hyperlipidemia, unspecified: Secondary | ICD-10-CM | POA: Diagnosis not present

## 2020-06-30 DIAGNOSIS — E782 Mixed hyperlipidemia: Secondary | ICD-10-CM | POA: Diagnosis not present

## 2020-06-30 MED ORDER — ATORVASTATIN CALCIUM 20 MG PO TABS: 20.0000 mg | ORAL_TABLET | Freq: Every day | ORAL | 3 refills | Status: DC

## 2020-06-30 MED ORDER — ALLOPURINOL 100 MG PO TABS: 100.0000 mg | ORAL_TABLET | Freq: Every day | ORAL | 3 refills | Status: DC

## 2020-06-30 NOTE — Progress Notes (Signed)
Subjective:    Patient ID: Danny Patterson, male    DOB: Jun 03, 1945, 75 y.o.   MRN: [id number]  Danny Patterson is a 75 y.o. 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 Not interested in other procedural intervention. He is asking about refill on Baclofen for back spasm.  BPH Nocturia, urinary frequency He can have some good urinary flow at times.  Taking Superbeta Prostatewith good results Previously with nephrolithiasis he was on K Citrate from Urologist, but stopped this didn't follow up  CHRONIC HTN/ CKD-II Home BP readings 144, 156, after sitting and resting BP can down to 128. on average, higher BP here He does take Lasix for fluidPRN none recently Off amlodipine currently now elevated BP Denies CP, dyspnea, HA, edema, dizziness / lightheadedness  CHRONIC DM, Type 2/ nephropathy Last A1c 4.6, very well controlled. Likely with wt loss lifestyle changes Not on acei arb He checks CBG once in a while. Denies hypoglycemia, polyuria, visual changes, numbness or tingling.  HYPERLIPIDEMIA: - Reports no concerns. Last lipid panel 06/2020, controlled - Currently taking Atorvastatin 20mg  daily, tolerating well without side effects or myalgias   Additional complaint  Tinnitus Chronic problem >30 years, Takes OTC med Lipoflavinoid for supplement, mixed results. Asking about other medication.  Chronic Knee PainOsteoarthritis / History of Chronic Gout Known history of recurrent knee pain. Previous gout flares episodic, seems improved. Today he reports recent worsening L knee pain and stiffness. He has known arthritis. In past using Diclofenac topical PRNsome  relief. He is interested in steroid injection. - Previously has seen Ortho / Rheumatology - he is also taking Colchicine 0.6mg  daily for prevention  Last lab Uric Acid 7.5, (06/2020), he is doing well on Allopurinol 100mg  daily. He says sometimes flared while on his knees and overworking.   Health Maintenance:  PSA 0.77 (06/2020) negative.  Depression screen [facility] 2/9 12/26/2019 06/26/2019 12/19/2018  Decreased Interest 0 0 0  Down, Depressed, Hopeless 0 0 0  PHQ - 2 Score 0 0 0    Past Medical History:  Diagnosis Date  . Allergy   . Arthritis   . Gout   . Hyperlipidemia   . Hyperlipidemia   . Hypertension   . Kidney stone   . Rheumatic fever    Past Surgical History:  Procedure Laterality Date  . BACK SURGERY    . knot     removed from neck  . POSTERIOR LUMBAR FUSION 4 LEVEL N/A 10/01/2015   Procedure: Lumbar three-four,  Lumbar four-five,  Lumbar five-Sacrum one posterior lumbar interbody fusion with Laminotomy at Lumbar Two-Three;  Surgeon: 12/21/2018, MD;  Location: [facility];  Service: Neurosurgery;  Laterality: N/A;   Social History   Socioeconomic History  . Marital status: Married    Spouse name: Patty  . Number of children: 3  . Years of education: 7  . Highest education level: Not on file  Occupational History    Comment: self employed  Tobacco Use  . Smoking status: Former Smoker    Types: Cigarettes  . Smokeless tobacco: Former Hilda Lias and Sexual Activity  . Alcohol use: Not Currently    Alcohol/week: 3.0 standard drinks  Types: 3 Cans of beer per week    Comment: 05/04/16 3 beers daily  . Drug use: No  . Sexual activity: Not on file  Other Topics Concern  . Not on file  Social History Narrative   Lives with wife   Caffeine- coffee, 2 cups daily   Social Determinants of Health   Financial Resource Strain: Not on file  Food Insecurity: Not on file  Transportation Needs: Not on file  Physical Activity: Not on file  Stress: Not on  file  Social Connections: Not on file  Intimate Partner Violence: Not on file   Family History  Problem Relation Age of Onset  . Parkinson's disease Father 46  . Kidney disease Maternal Grandmother   . Hypertension Sister   . Kidney cancer Neg Hx   . Prostate cancer Neg Hx    Current Outpatient Medications on File Prior to Visit  Medication Sig  . acetaminophen (TYLENOL) 500 MG tablet Take 2 tablets (1,000 mg total) by mouth every 8 (eight) hours as needed.  Marland Kitchen amLODipine (NORVASC) 5 MG tablet Take 1 tablet (5 mg total) by mouth daily.  . baclofen (LIORESAL) 10 MG tablet Take 0.5-1 tablets (5-10 mg total) by mouth 3 (three) times daily as needed for muscle spasms.  . cholecalciferol (VITAMIN D) 1000 units tablet Take 1,000 Units daily by mouth.  . colchicine 0.6 MG tablet FOR GOUT FLARE TAKE 1 PILL THEN REPEAT WITHIN 2 HOURS, THEN MAY TAKE 1 PILL DAILY UNTIL RESOLVED FOR 7-10 DAYS.  Marland Kitchen diclofenac Sodium (VOLTAREN) 1 % GEL APPLY 2 G TOPICALLY 3 (THREE) TIMES DAILY. AS NEEDED FOR HANDS AND KNEE, ARTHRITIS.  . furosemide (LASIX) 20 MG tablet Take 1-2 tablets (20-40 mg total) by mouth daily as needed for edema.  Marland Kitchen HORSE CHESTNUT PO Take by mouth.  . magnesium gluconate (MAGONATE) 500 MG tablet Take 500 mg by mouth daily.  . potassium citrate (UROCIT-K) 10 MEQ (1080 MG) SR tablet Take 10 mEq by mouth 3 (three) times daily with meals.   No current facility-administered medications on file prior to visit.    Review of Systems  Constitutional: Negative for activity change, appetite change, chills, diaphoresis, fatigue and fever.  HENT: Negative for congestion and hearing loss.   Eyes: Negative for visual disturbance.  Respiratory: Negative for cough, chest tightness, shortness of breath and wheezing.   Cardiovascular: Negative for chest pain, palpitations and leg swelling.  Gastrointestinal: Negative for abdominal pain, constipation, diarrhea, nausea and vomiting.  Endocrine: Negative for  cold intolerance.  Genitourinary: Negative for dysuria, frequency and hematuria.  Musculoskeletal: Negative for arthralgias and neck pain.  Skin: Negative for rash.  Allergic/Immunologic: Negative for environmental allergies.  Neurological: Negative for dizziness, weakness, light-headedness, numbness and headaches.  Hematological: Negative for adenopathy.  Psychiatric/Behavioral: Negative for behavioral problems, dysphoric mood and sleep disturbance.   Per HPI unless specifically indicated above      Objective:    BP 136/80 (BP Location: Left Arm, Cuff Size: Normal)   Pulse 69   Ht 5\' 10"  (1.778 m)   Wt 208 lb 6.4 oz (94.5 kg)   SpO2 98%   BMI 29.90 kg/m   Wt Readings from Last 3 Encounters:  06/30/20 208 lb 6.4 oz (94.5 kg)  12/26/19 202 lb (91.6 kg)  06/26/19 221 lb 9.6 oz (100.5 kg)    Physical Exam Vitals and nursing note reviewed.  Constitutional:      General: He is not in acute distress.    Appearance:  He is well-developed. He is not diaphoretic.     Comments: Well-appearing, comfortable, cooperative  HENT:     Head: Normocephalic and atraumatic.  Eyes:     General:        Right eye: No discharge.        Left eye: No discharge.     Conjunctiva/sclera: Conjunctivae normal.     Pupils: Pupils are equal, round, and reactive to light.  Neck:     Thyroid: No thyromegaly.  Cardiovascular:     Rate and Rhythm: Normal rate and regular rhythm.     Heart sounds: Normal heart sounds. No murmur heard.   Pulmonary:     Effort: Pulmonary effort is normal. No respiratory distress.     Breath sounds: Normal breath sounds. No wheezing or rales.  Abdominal:     General: Bowel sounds are normal. There is no distension.     Palpations: Abdomen is soft. There is no mass.     Tenderness: There is no abdominal tenderness.  Musculoskeletal:        General: No tenderness. Normal range of motion.     Cervical back: Normal range of motion and neck supple.     Right lower leg: No  edema.     Left lower leg: No edema.     Comments: Upper / Lower Extremities: - Normal muscle tone, strength bilateral upper extremities 5/5, lower extremities 5/5  Lymphadenopathy:     Cervical: No cervical adenopathy.  Skin:    General: Skin is warm and dry.     Findings: No erythema or rash.  Neurological:     Mental Status: He is alert and oriented to person, place, and time.     Comments: Distal sensation intact to light touch all extremities  Psychiatric:        Behavior: Behavior normal.     Comments: Well groomed, good eye contact, normal speech and thoughts    Results for orders placed or performed in visit on 06/20/20  TSH  Result Value Ref Range   TSH 3.04 0.40 - 4.50 mIU/L  Uric acid  Result Value Ref Range   Uric Acid, Serum 7.5 4.0 - 8.0 mg/dL  PSA  Result Value Ref Range   PSA 0.77 < OR = 4.0 ng/mL  Lipid panel  Result Value Ref Range   Cholesterol 133 <200 mg/dL   HDL 74 > OR = 40 mg/dL   Triglycerides 56 <591 mg/dL   LDL Cholesterol (Calc) 45 mg/dL (calc)   Total CHOL/HDL Ratio 1.8 <5.0 (calc)   Non-HDL Cholesterol (Calc) 59 <638 mg/dL (calc)  COMPLETE METABOLIC PANEL WITH GFR  Result Value Ref Range   Glucose, Bld 76 65 - 99 mg/dL   BUN 18 7 - 25 mg/dL   Creat 4.66 5.99 - 3.57 mg/dL   GFR, Est Non African American 63 > OR = 60 mL/min/1.66m2   GFR, Est African American 73 > OR = 60 mL/min/1.28m2   BUN/Creatinine Ratio NOT APPLICABLE 6 - 22 (calc)   Sodium 142 135 - 146 mmol/L   Potassium 4.7 3.5 - 5.3 mmol/L   Chloride 101 98 - 110 mmol/L   CO2 31 20 - 32 mmol/L   Calcium 9.7 8.6 - 10.3 mg/dL   Total Protein 6.5 6.1 - 8.1 g/dL   Albumin 4.3 3.6 - 5.1 g/dL   Globulin 2.2 1.9 - 3.7 g/dL (calc)   AG Ratio 2.0 1.0 - 2.5 (calc)   Total Bilirubin 0.6 0.2 - 1.2  mg/dL   Alkaline phosphatase (APISO) 79 35 - 144 U/L   AST 43 (H) 10 - 35 U/L   ALT 22 9 - 46 U/L  CBC with Differential/Platelet  Result Value Ref Range   WBC 5.5 3.8 - 10.8 Thousand/uL    RBC 3.82 (L) 4.20 - 5.80 Million/uL   Hemoglobin 12.3 (L) 13.2 - 17.1 g/dL   HCT 16.1 (L) 09.6 - 04.5 %   MCV 95.8 80.0 - 100.0 fL   MCH 32.2 27.0 - 33.0 pg   MCHC 33.6 32.0 - 36.0 g/dL   RDW 40.9 81.1 - 91.4 %   Platelets 150 140 - 400 Thousand/uL   MPV 10.4 7.5 - 12.5 fL   Neutro Abs 2,651 1,500 - 7,800 cells/uL   Lymphs Abs 2,145 850 - 3,900 cells/uL   Absolute Monocytes 336 200 - 950 cells/uL   Eosinophils Absolute 297 15 - 500 cells/uL   Basophils Absolute 72 0 - 200 cells/uL   Neutrophils Relative % 48.2 %   Total Lymphocyte 39.0 %   Monocytes Relative 6.1 %   Eosinophils Relative 5.4 %   Basophils Relative 1.3 %  Hemoglobin A1c  Result Value Ref Range   Hgb A1c MFr Bld 4.6 <5.7 % of total Hgb   Mean Plasma Glucose 85 mg/dL   eAG (mmol/L) 4.7 mmol/L      Assessment & Plan:   Problem List Items Addressed This Visit    Type 2 diabetes with nephropathy (HCC)    Well-controlled DM with A1c 4.6 Complications - CKD-II, peripheral neuropathy (not necessarily 2/2 DM - with critical illness neuropathy)  Plan:  1. Not on therapy 2. Encourage improved lifestyle - low carb, low sugar diet, reduce portion size, start regular exercise 3. On Statin       Relevant Medications   atorvastatin (LIPITOR) 20 MG tablet   Hyperlipidemia associated with type 2 diabetes mellitus (HCC)    Controlled cholesterol on statin and lifestyle wt loss Last lipid panel 06/2020  Plan: 1. Continue current meds - Atorvastatin  2. Encourage improved lifestyle - low carb/cholesterol, reduce portion size, continue improving regular exercise      Relevant Medications   atorvastatin (LIPITOR) 20 MG tablet   Gout    Improved Uric Acid in 7 range Infrequent gout flares On Allopurinol  daily      Relevant Medications   allopurinol (ZYLOPRIM) 100 MG tablet   BPH with obstruction/lower urinary tract symptoms    Chronic BPH, mild improved seems stable No alpha blocker - Last PSA 0.7  (06/2020) - No known personal/family history of prostate CA  Plan: May continue prostate supplement saw palmetto Follow up if worsening or new concerns      Benign hypertension with CKD (chronic kidney disease), stage II    Mildly elevated initial BP, repeat manual check improved Complication CKD-II    Plan:  1. Continue current BP regimen - Amlodipine  daily, taking Lasix PRN 2. Encourage improved lifestyle - low sodium diet, regular exercise 3. Continue monitor BP outside office, bring readings to next visit, if persistently >140/90 or new symptoms notify office sooner      Relevant Medications   atorvastatin (LIPITOR) 20 MG tablet    Other Visit Diagnoses    Annual physical exam    -  Primary   Elevated uric acid in blood       Relevant Medications   allopurinol (ZYLOPRIM) 100 MG tablet   Mixed hyperlipidemia  Relevant Medications   atorvastatin (LIPITOR) 20 MG tablet   BMI 29.0-29.9,adult           Updated Health Maintenance information PSA negative Reviewed recent lab results with patient Encouraged improvement to lifestyle with diet and exercise Goal of weight loss   Meds ordered this encounter  Medications  . allopurinol (ZYLOPRIM) 100 MG tablet    Sig: Take 1 tablet (100 mg total) by mouth daily.    Dispense:  90 tablet    Refill:  3    Keep refills on file  . atorvastatin (LIPITOR) 20 MG tablet    Sig: Take 1 tablet (20 mg total) by mouth daily at 6 PM.    Dispense:  90 tablet    Refill:  3    Keep refills on file     Follow up plan: Return in about 6 months (around 12/30/2020) for 6 month follow-up HTN DM.  Saralyn PilarAlexander Brittnee Gaetano, DO [facility] 06/30/2020, 2:00 PM

## 2020-06-30 NOTE — Assessment & Plan Note (Signed)
Chronic BPH, mild improved seems stable No alpha blocker - Last PSA 0.7 (06/2020) - No known personal/family history of prostate CA  Plan: May continue prostate supplement saw palmetto Follow up if worsening or new concerns

## 2020-06-30 NOTE — Patient Instructions (Addendum)
Thank you for coming to the office today.  Keep up the good work  Can keep the ear ringing medication if you want, it is optional it is a supplement only   Please schedule a Follow-up Appointment to: Return in about 6 months (around 12/30/2020) for 6 month follow-up HTN DM.  If you have any other questions or concerns, please feel free to call the office or send a message through MyChart. You may also schedule an earlier appointment if necessary.  Additionally, you may be receiving a survey about your experience at our office within a few days to 1 week by e-mail or mail. We value your feedback.  [name], DO [facility], [place]

## 2020-06-30 NOTE — Assessment & Plan Note (Signed)
Improved Uric Acid in 7 range Infrequent gout flares On Allopurinol 100mg  daily

## 2020-06-30 NOTE — Assessment & Plan Note (Signed)
Well-controlled DM with A1c 4.6 Complications - CKD-II, peripheral neuropathy (not necessarily 2/2 DM - with critical illness neuropathy)  Plan:  1. Not on therapy 2. Encourage improved lifestyle - low carb, low sugar diet, reduce portion size, start regular exercise 3. On Statin

## 2020-06-30 NOTE — Assessment & Plan Note (Addendum)
Mildly elevated initial BP, repeat manual check improved Complication CKD-II    Plan:  1. Continue current BP regimen - Amlodipine 5mg  daily, taking Lasix PRN 2. Encourage improved lifestyle - low sodium diet, regular exercise 3. Continue monitor BP outside office, bring readings to next visit, if persistently >140/90 or new symptoms notify office sooner

## 2020-06-30 NOTE — Assessment & Plan Note (Signed)
Controlled cholesterol on statin and lifestyle wt loss Last lipid panel 06/2020  Plan: 1. Continue current meds - Atorvastatin 20mg  2. Encourage improved lifestyle - low carb/cholesterol, reduce portion size, continue improving regular exercise

## 2020-10-22 ENCOUNTER — Other Ambulatory Visit: Payer: Self-pay | Admitting: Family Medicine

## 2020-10-22 DIAGNOSIS — M1A0790 Idiopathic chronic gout, unspecified ankle and foot, without tophus (tophi): Secondary | ICD-10-CM

## 2020-10-22 NOTE — Telephone Encounter (Signed)
Requested medications are due for refill today.  yes  Requested medications are on the active medications list.  yes  Last refill. 05/14/2019  Future visit scheduled.   no  Notes to clinic.  Prescription is expired.

## 2020-11-13 ENCOUNTER — Other Ambulatory Visit: Payer: Self-pay | Admitting: Family Medicine

## 2020-11-13 DIAGNOSIS — M8949 Other hypertrophic osteoarthropathy, multiple sites: Secondary | ICD-10-CM

## 2020-11-13 DIAGNOSIS — M159 Polyosteoarthritis, unspecified: Secondary | ICD-10-CM

## 2020-11-13 NOTE — Telephone Encounter (Signed)
Requested medication (s) are due for refill today: expired medication  Requested medication (s) are on the active medication list: yes   Last refill:  10/31/19 #100g 5 refills  Future visit scheduled: no  Notes to clinic:  expired medication do you want to renew Rx?     Requested Prescriptions  Pending Prescriptions Disp Refills   diclofenac Sodium (VOLTAREN) 1 % GEL [Pharmacy Med Name: DICLOFENAC SODIUM 1% GEL] 100 g 5    Sig: APPLY 2 G TOPICALLY 3 (THREE) TIMES DAILY. AS NEEDED FOR HANDS AND KNEE, ARTHRITIS.     Analgesics:  Topicals Passed - 11/13/2020  4:23 PM      Passed - Valid encounter within last 12 months    Recent Outpatient Visits           4 months ago Annual physical exam   [facility] Smitty Cords, DO   10 months ago Benign hypertension with CKD (chronic kidney disease), stage II   [facility] [place], Netta Neat, DO   1 year ago Obesity (BMI 30.0-34.9)   [facility] Althe[facility] Charon, Netta Neat, DO   1 year ago Annual physical exam   [facility] Smitty Cords, DO   1 year ago Essential hypertension   [facility] [place], Netta Neat, [place]

## 2021-01-27 ENCOUNTER — Other Ambulatory Visit: Payer: Self-pay | Admitting: Family Medicine

## 2021-01-27 DIAGNOSIS — M1A0790 Idiopathic chronic gout, unspecified ankle and foot, without tophus (tophi): Secondary | ICD-10-CM

## 2021-01-28 NOTE — Telephone Encounter (Signed)
Requested Prescriptions  Pending Prescriptions Disp Refills  . colchicine 0.6 MG tablet [Pharmacy Med Name: COLCHICINE 0.6 MG TABLET] 90 tablet 0    Sig: FOR GOUT FLARE TAKE 1 PILL THEN REPEAT WITHIN 2 HOURS, THEN MAY TAKE 1 PILL DAILY UNTIL RESOLVED FOR 7-10 DAYS.     Endocrinology:  Gout Agents Passed - 01/27/2021  8:54 PM      Passed - Uric Acid in normal range and within 360 days    Uric Acid, Serum  Date Value Ref Range Status  06/23/2020 7.5 4.0 - 8.0 mg/dL Final    Comment:    Therapeutic target for gout patients: <6.0 mg/dL .    Uric Acid  Date Value Ref Range Status  02/26/2015 5.4 3.7 - 8.6 mg/dL Final    Comment:               Therapeutic target for gout patients: <6.0         Passed - Cr in normal range and within 360 days    Creat  Date Value Ref Range Status  06/23/2020 1.14 0.70 - 1.18 mg/dL Final    Comment:    For patients >9 years of age, the reference limit for Creatinine is approximately 13% higher for people identified as African-American. Danny Patterson - Valid encounter within last 12 months    Recent Outpatient Visits          7 months ago Annual physical exam   [facility] Smitty Cords, DO   1 year ago Benign hypertension with CKD (chronic kidney disease), stage II   [facility] [place], Netta Neat, DO   1 year ago Obesity (BMI 30.0-34.9)   [facility] Althea Charon, Netta Neat, DO   2 years ago Annual physical exam   [facility] Smitty Cords, DO   2 years ago Essential hypertension   [facility] [place], Netta Neat, [place]

## 2021-03-07 ENCOUNTER — Other Ambulatory Visit: Payer: Self-pay | Admitting: Family Medicine

## 2021-03-07 DIAGNOSIS — N182 Chronic kidney disease, stage 2 (mild): Secondary | ICD-10-CM

## 2021-03-07 DIAGNOSIS — I129 Hypertensive chronic kidney disease with stage 1 through stage 4 chronic kidney disease, or unspecified chronic kidney disease: Secondary | ICD-10-CM

## 2021-03-08 NOTE — Telephone Encounter (Signed)
Requested Prescriptions  Pending Prescriptions Disp Refills   amLODipine (NORVASC) 5 MG tablet [Pharmacy Med Name: AMLODIPINE BESYLATE 5 MG TAB] 90 tablet 0    Sig: TAKE 1 TABLET BY MOUTH EVERY DAY     Cardiovascular:  Calcium Channel Blockers Failed - 03/07/2021  9:10 AM      Failed - Valid encounter within last 6 months    Recent Outpatient Visits          8 months ago Annual physical exam   [facility] Smitty Cords, DO   1 year ago Benign hypertension with CKD (chronic kidney disease), stage II   [facility] [place], Netta Neat, DO   1 year ago Obesity (BMI 30.0-34.9)   [facility] Althea Charon, Netta Neat, DO   2 years ago Annual physical exam   [facility] Smitty Cords, DO   2 years ago Essential hypertension   [facility] Smitty Cords, DO             Passed - Last BP in normal range    BP Readings from Last 1 Encounters:  06/30/20 136/80

## 2021-04-20 ENCOUNTER — Other Ambulatory Visit: Payer: Self-pay | Admitting: Family Medicine

## 2021-04-20 DIAGNOSIS — M1A0790 Idiopathic chronic gout, unspecified ankle and foot, without tophus (tophi): Secondary | ICD-10-CM

## 2021-04-20 NOTE — Telephone Encounter (Signed)
Requested Prescriptions  Pending Prescriptions Disp Refills   colchicine 0.6 MG tablet [Pharmacy Med Name: COLCHICINE 0.6 MG TABLET] 90 tablet 0    Sig: FOR GOUT FLARE TAKE 1 PILL THEN REPEAT WITHIN 2 HOURS, THEN MAY TAKE 1 PILL DAILY UNTIL RESOLVED FOR 7-10 DAYS.     Endocrinology:  Gout Agents Passed - 04/20/2021  8:32 AM      Passed - Uric Acid in normal range and within 360 days    Uric Acid, Serum  Date Value Ref Range Status  06/23/2020 7.5 4.0 - 8.0 mg/dL Final    Comment:    Therapeutic target for gout patients: <6.0 mg/dL .    Uric Acid  Date Value Ref Range Status  02/26/2015 5.4 3.7 - 8.6 mg/dL Final    Comment:               Therapeutic target for gout patients: <6.0         Passed - Cr in normal range and within 360 days    Creat  Date Value Ref Range Status  06/23/2020 1.14 0.70 - 1.18 mg/dL Final    Comment:    For patients >47 years of age, the reference limit for Creatinine is approximately 13% higher for people identified as African-American. Renella Cunas - Valid encounter within last 12 months    Recent Outpatient Visits          9 months ago Annual physical exam   [place], DO   1 year ago Benign hypertension with CKD (chronic kidney disease), stage II   [place], DO   1 year ago Obesity (BMI 30.0-34.9)   [facility] [place], Devonne Doughty, DO   2 years ago Annual physical exam   [facility] Olin Hauser, DO   2 years ago Essential hypertension   [place], Devonne Doughty, [place]

## 2021-04-23 ENCOUNTER — Telehealth: Payer: Self-pay

## 2021-04-23 NOTE — Telephone Encounter (Signed)
I attempted to contact the patient, no answer. LMOM that he need to be scheduled for his medicare wellness visit.

## 2021-04-27 ENCOUNTER — Telehealth: Payer: Self-pay

## 2021-04-27 NOTE — Telephone Encounter (Signed)
I called the patient and left a message for him to return my call. I was calling to schedule his AWV and to update his last Diabetic Eye Exam.

## 2021-05-19 ENCOUNTER — Other Ambulatory Visit: Payer: Self-pay | Admitting: Family Medicine

## 2021-05-19 ENCOUNTER — Telehealth: Payer: Self-pay

## 2021-05-19 DIAGNOSIS — M1A0790 Idiopathic chronic gout, unspecified ankle and foot, without tophus (tophi): Secondary | ICD-10-CM

## 2021-05-19 NOTE — Telephone Encounter (Signed)
Left message for patient to call back and schedule the Medicare Annual Wellness Visit (AWV) virtually, telephone or face to face. I informed the patient that he can get his Medicare Wellness and Physical with Dr. Kirtland Bouchard on the same day if he prefers.  Laurel Dimmer, CMA  [phone]

## 2021-05-20 NOTE — Telephone Encounter (Signed)
Requested medication (s) are due for refill today:   No ? ?Requested medication (s) are on the active medication list:   Yes ? ?Future visit scheduled:   No ? ? ?Last ordered: 04/20/2021 #90, 0 refills ? ?Returned because protocol criteria not met.   Labs out of range/due  ? ?Requested Prescriptions  ?Pending Prescriptions Disp Refills  ? colchicine 0.6 MG tablet [Pharmacy Med Name: COLCHICINE 0.6 MG TABLET] 90 tablet 0  ?  Sig: FOR GOUT FLARE TAKE 1 PILL THEN REPEAT WITHIN 2 HOURS, THEN MAY TAKE 1 PILL DAILY UNTIL RESOLVED FOR 7-10 DAYS.  ?  ? Endocrinology:  Gout Agents - colchicine Failed - 05/19/2021 12:42 PM  ?  ?  Failed - AST in normal range and within 360 days  ?  AST  ?Date Value Ref Range Status  ?06/23/2020 43 (H) 10 - 35 U/L Final  ?  ?  ?  ?  Passed - Cr in normal range and within 360 days  ?  Creat  ?Date Value Ref Range Status  ?06/23/2020 1.14 0.70 - 1.18 mg/dL Final  ?  Comment:  ?  For patients >22 years of age, the reference limit ?for Creatinine is approximately 13% higher for people ?identified as African-American. ?. ?  ?  ?  ?  ?  Passed - ALT in normal range and within 360 days  ?  ALT  ?Date Value Ref Range Status  ?06/23/2020 22 9 - 46 U/L Final  ?  ?  ?  ?  Passed - Valid encounter within last 12 months  ?  Recent Outpatient Visits   ? ?      ? 10 months ago Annual physical exam  ? [place], DO  ? 1 year ago Benign hypertension with CKD (chronic kidney disease), stage II  ? [place], DO  ? 1 year ago Obesity (BMI 30.0-34.9)  ? [place], DO  ? 2 years ago Annual physical exam  ? [facility] Olin Hauser, DO  ? 2 years ago Essential hypertension  ? [place], DO  ? ?  ?  ? ?  ?  ?  Passed - CBC within normal limits and completed in the last 12 months  ?  WBC  ?Date Value Ref Range Status   ?06/23/2020 5.5 3.8 - 10.8 Thousand/uL Final  ? ?RBC  ?Date Value Ref Range Status  ?06/23/2020 3.82 (L) 4.20 - 5.80 Million/uL Final  ? ?Hemoglobin  ?Date Value Ref Range Status  ?06/23/2020 12.3 (L) 13.2 - 17.1 g/dL Final  ?02/26/2015 12.7 12.6 - 17.7 g/dL Final  ? ?HCT  ?Date Value Ref Range Status  ?06/23/2020 36.6 (L) 38.5 - 50.0 % Final  ? ?Hematocrit  ?Date Value Ref Range Status  ?02/26/2015 36.5 (L) 37.5 - 51.0 % Final  ? ?MCHC  ?Date Value Ref Range Status  ?06/23/2020 33.6 32.0 - 36.0 g/dL Final  ? ?MCH  ?Date Value Ref Range Status  ?06/23/2020 32.2 27.0 - 33.0 pg Final  ? ?MCV  ?Date Value Ref Range Status  ?06/23/2020 95.8 80.0 - 100.0 fL Final  ?02/26/2015 92 79 - 97 fL Final  ? ?No results found for: PLTCOUNTKUC, LABPLAT, [place] ?RDW  ?Date Value Ref Range Status  ?06/23/2020 13.2 11.0 - 15.0 % Final  ?02/26/2015 14.8 12.3 - 15.4 % Final  ? ?  ?  ?  ? ?

## 2021-06-12 ENCOUNTER — Other Ambulatory Visit: Payer: Self-pay | Admitting: Family Medicine

## 2021-06-12 DIAGNOSIS — I129 Hypertensive chronic kidney disease with stage 1 through stage 4 chronic kidney disease, or unspecified chronic kidney disease: Secondary | ICD-10-CM

## 2021-06-15 NOTE — Telephone Encounter (Signed)
Requested medications are due for refill today.  yes ? ?Requested medications are on the active medications list.  yes ? ?Last refill. 03/08/2021 #90 0 refills ? ?Future visit scheduled.   No - sent MyChart message ? ?Notes to clinic.  Pt last seen 06/30/2020. More than 3 months overdue for OV. Also called - busy signal. ? ? ? ?Requested Prescriptions  ?Pending Prescriptions Disp Refills  ? amLODipine (NORVASC) 5 MG tablet [Pharmacy Med Name: AMLODIPINE BESYLATE 5 MG TAB] 90 tablet 0  ?  Sig: TAKE 1 TABLET BY MOUTH EVERY DAY  ?  ? Cardiovascular: Calcium Channel Blockers 2 Failed - 06/12/2021 11:41 AM  ?  ?  Failed - Valid encounter within last 6 months  ?  Recent Outpatient Visits   ? ?      ? 11 months ago Annual physical exam  ? [place], DO  ? 1 year ago Benign hypertension with CKD (chronic kidney disease), stage II  ? [place], DO  ? 1 year ago Obesity (BMI 30.0-34.9)  ? [place], DO  ? 2 years ago Annual physical exam  ? [facility] Olin Hauser, DO  ? 2 years ago Essential hypertension  ? [place], DO  ? ?  ?  ? ?  ?  ?  Passed - Last BP in normal range  ?  BP Readings from Last 1 Encounters:  ?06/30/20 136/80  ?  ?  ?  ?  Passed - Last Heart Rate in normal range  ?  Pulse Readings from Last 1 Encounters:  ?06/30/20 69  ?  ?  ?  ?  ?  ?

## 2021-06-17 ENCOUNTER — Telehealth: Payer: Self-pay

## 2021-06-17 NOTE — Telephone Encounter (Signed)
I attempted to contact the patient to schedule his AWV. His voice message state the number that you are attempting to call is not accepting your calls.  ? ?Laurel Dimmer, CMA ? ?[phone] ?

## 2021-06-20 ENCOUNTER — Other Ambulatory Visit: Payer: Self-pay | Admitting: Family Medicine

## 2021-06-20 DIAGNOSIS — M1A0790 Idiopathic chronic gout, unspecified ankle and foot, without tophus (tophi): Secondary | ICD-10-CM

## 2021-06-22 NOTE — Telephone Encounter (Signed)
Requesting early, filled for #90 05/20/21. ? ?Requested Prescriptions  ?Pending Prescriptions Disp Refills  ?? colchicine 0.6 MG tablet [Pharmacy Med Name: COLCHICINE 0.6 MG TABLET] 90 tablet 0  ?  Sig: FOR GOUT FLARE TAKE 1 PILL THEN REPEAT WITHIN 2 HOURS, THEN MAY TAKE 1 PILL DAILY UNTIL RESOLVED FOR 7-10 DAYS.  ?  ? Endocrinology:  Gout Agents - colchicine Failed - 06/20/2021  1:32 PM  ?  ?  Failed - Cr in normal range and within 360 days  ?  Creat  ?Date Value Ref Range Status  ?06/23/2020 1.14 0.70 - 1.18 mg/dL Final  ?  Comment:  ?  For patients >21 years of age, the reference limit ?for Creatinine is approximately 13% higher for people ?identified as African-American. ?. ?  ?   ?  ?  Failed - ALT in normal range and within 360 days  ?  ALT  ?Date Value Ref Range Status  ?06/23/2020 22 9 - 46 U/L Final  ?   ?  ?  Failed - AST in normal range and within 360 days  ?  AST  ?Date Value Ref Range Status  ?06/23/2020 43 (H) 10 - 35 U/L Final  ?   ?  ?  Failed - CBC within normal limits and completed in the last 12 months  ?  WBC  ?Date Value Ref Range Status  ?06/23/2020 5.5 3.8 - 10.8 Thousand/uL Final  ? ?RBC  ?Date Value Ref Range Status  ?06/23/2020 3.82 (L) 4.20 - 5.80 Million/uL Final  ? ?Hemoglobin  ?Date Value Ref Range Status  ?06/23/2020 12.3 (L) 13.2 - 17.1 g/dL Final  ?02/26/2015 12.7 12.6 - 17.7 g/dL Final  ? ?HCT  ?Date Value Ref Range Status  ?06/23/2020 36.6 (L) 38.5 - 50.0 % Final  ? ?Hematocrit  ?Date Value Ref Range Status  ?02/26/2015 36.5 (L) 37.5 - 51.0 % Final  ? ?MCHC  ?Date Value Ref Range Status  ?06/23/2020 33.6 32.0 - 36.0 g/dL Final  ? ?MCH  ?Date Value Ref Range Status  ?06/23/2020 32.2 27.0 - 33.0 pg Final  ? ?MCV  ?Date Value Ref Range Status  ?06/23/2020 95.8 80.0 - 100.0 fL Final  ?02/26/2015 92 79 - 97 fL Final  ? ?No results found for: PLTCOUNTKUC, LABPLAT, [place] ?RDW  ?Date Value Ref Range Status  ?06/23/2020 13.2 11.0 - 15.0 % Final  ?02/26/2015 14.8 12.3 - 15.4 % Final  ? ?  ?  ?   Passed - Valid encounter within last 12 months  ?  Recent Outpatient Visits   ?      ? 11 months ago Annual physical exam  ? [place], DO  ? 1 year ago Benign hypertension with CKD (chronic kidney disease), stage II  ? [place], DO  ? 1 year ago Obesity (BMI 30.0-34.9)  ? [place], DO  ? 2 years ago Annual physical exam  ? [facility] Olin Hauser, DO  ? 2 years ago Essential hypertension  ? [place], DO  ?  ?  ? ?  ?  ?  ? ? ?

## 2021-09-22 ENCOUNTER — Other Ambulatory Visit: Payer: Self-pay | Admitting: Family Medicine

## 2021-09-22 DIAGNOSIS — I129 Hypertensive chronic kidney disease with stage 1 through stage 4 chronic kidney disease, or unspecified chronic kidney disease: Secondary | ICD-10-CM

## 2021-09-23 NOTE — Telephone Encounter (Signed)
Pt has future OV scheduled 10/14/21. Will refill medication.  Requested Prescriptions  Pending Prescriptions Disp Refills  . amLODipine (NORVASC) 5 MG tablet [Pharmacy Med Name: AMLODIPINE BESYLATE 5 MG TAB] 90 tablet 0    Sig: TAKE 1 TABLET BY MOUTH EVERY DAY     Cardiovascular: Calcium Channel Blockers 2 Failed - 09/22/2021  1:43 AM      Failed - Valid encounter within last 6 months    Recent Outpatient Visits          1 year ago Annual physical exam   [facility] Smitty Cords, DO   1 year ago Benign hypertension with CKD (chronic kidney disease), stage II   [facility] [place], Netta Neat, DO   2 years ago Obesity (BMI 30.0-34.9)   [facility] Althea Charon, Netta Neat, DO   2 years ago Annual physical exam   [facility] Smitty Cords, DO   2 years ago Essential hypertension   [facility] Althea Charon, Netta Neat, DO      Future Appointments            In 3 weeks Althea Charon, Netta Neat, DO [facility]           Passed - Last BP in normal range    BP Readings from Last 1 Encounters:  06/30/20 136/80         Passed - Last Heart Rate in normal range    Pulse Readings from Last 1 Encounters:  06/30/20 69

## 2021-10-14 ENCOUNTER — Ambulatory Visit (INDEPENDENT_AMBULATORY_CARE_PROVIDER_SITE_OTHER): Payer: PPO | Admitting: Family Medicine

## 2021-10-14 ENCOUNTER — Encounter: Payer: Self-pay | Admitting: Family Medicine

## 2021-10-14 VITALS — BP 147/73 | HR 73 | Ht 70.0 in | Wt 211.6 lb

## 2021-10-14 DIAGNOSIS — E782 Mixed hyperlipidemia: Secondary | ICD-10-CM

## 2021-10-14 DIAGNOSIS — N401 Enlarged prostate with lower urinary tract symptoms: Secondary | ICD-10-CM

## 2021-10-14 DIAGNOSIS — M159 Polyosteoarthritis, unspecified: Secondary | ICD-10-CM

## 2021-10-14 DIAGNOSIS — E785 Hyperlipidemia, unspecified: Secondary | ICD-10-CM | POA: Diagnosis not present

## 2021-10-14 DIAGNOSIS — N138 Other obstructive and reflux uropathy: Secondary | ICD-10-CM

## 2021-10-14 DIAGNOSIS — R600 Localized edema: Secondary | ICD-10-CM

## 2021-10-14 DIAGNOSIS — Z0000 Encounter for general adult medical examination without abnormal findings: Secondary | ICD-10-CM

## 2021-10-14 DIAGNOSIS — M1A0790 Idiopathic chronic gout, unspecified ankle and foot, without tophus (tophi): Secondary | ICD-10-CM

## 2021-10-14 DIAGNOSIS — E790 Hyperuricemia without signs of inflammatory arthritis and tophaceous disease: Secondary | ICD-10-CM

## 2021-10-14 DIAGNOSIS — E1121 Type 2 diabetes mellitus with diabetic nephropathy: Secondary | ICD-10-CM

## 2021-10-14 DIAGNOSIS — N182 Chronic kidney disease, stage 2 (mild): Secondary | ICD-10-CM | POA: Diagnosis not present

## 2021-10-14 DIAGNOSIS — I129 Hypertensive chronic kidney disease with stage 1 through stage 4 chronic kidney disease, or unspecified chronic kidney disease: Secondary | ICD-10-CM

## 2021-10-14 DIAGNOSIS — Z23 Encounter for immunization: Secondary | ICD-10-CM | POA: Diagnosis not present

## 2021-10-14 DIAGNOSIS — G6281 Critical illness polyneuropathy: Secondary | ICD-10-CM | POA: Diagnosis not present

## 2021-10-14 DIAGNOSIS — E1169 Type 2 diabetes mellitus with other specified complication: Secondary | ICD-10-CM

## 2021-10-14 MED ORDER — AMLODIPINE BESYLATE 5 MG PO TABS: 5.0000 mg | ORAL_TABLET | Freq: Every day | ORAL | 3 refills | Status: DC

## 2021-10-14 MED ORDER — ALLOPURINOL 100 MG PO TABS: 100.0000 mg | ORAL_TABLET | Freq: Every day | ORAL | 3 refills | Status: DC

## 2021-10-14 MED ORDER — ATORVASTATIN CALCIUM 20 MG PO TABS: 20.0000 mg | ORAL_TABLET | Freq: Every day | ORAL | 3 refills | Status: DC

## 2021-10-14 MED ORDER — PREGABALIN 50 MG PO CAPS: 50.0000 mg | ORAL_CAPSULE | Freq: Two times a day (BID) | ORAL | 2 refills | Status: DC

## 2021-10-14 MED ORDER — TAMSULOSIN HCL 0.4 MG PO CAPS
0.4000 mg | ORAL_CAPSULE | Freq: Every day | ORAL | 3 refills | Status: DC
Start: 2021-10-14 — End: 2022-01-15

## 2021-10-14 NOTE — Patient Instructions (Addendum)
Thank you for coming to the office today.  Labs today, we will notify you of results.  Refilled medications.  Start new Nerve Pain Pill - Pregabalin / Lyrica 50mg  twice a day. If this helps we can order more.  You can stop the Super Beta Prostate.  Start the Tamsulosin 0.4mg  capsule daily for prostate urinary symptoms.  Prevnar 20 Pneumonia vaccine today.  Please schedule a Follow-up Appointment to: Return in about 3 months (around 01/14/2022) for 3 month follow-up Nerve pain / Prostate.  If you have any other questions or concerns, please feel free to call the office or send a message through MyChart. You may also schedule an earlier appointment if necessary.  Additionally, you may be receiving a survey about your experience at our office within a few days to 1 week by e-mail or mail. We value your feedback.  01/16/2022, DO [facility], [facility]

## 2021-10-14 NOTE — Progress Notes (Unsigned)
Subjective:    Patient ID: Danny Patterson, male    DOB: 1946-02-14, 76 y.o.   MRN: [id number]  HORACIO PALLER is a 76 y.o. 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 Not interested in other procedural intervention. He is asking about refill on Baclofen for back spasm.   BPH Nocturia, urinary frequency He can have some good urinary flow at times.  Previously with nephrolithiasis he was on K Citrate from Urologist, but stopped this didn't follow up ***He tried Super Beta Prostate says not as effective ***He continues K Citrate      CHRONIC HTN / CKD-II  *** out of med for 1 week  Home BP readings 144, 156, after sitting and resting BP can down to 128. on average, higher BP here He does take Lasix for fluid PRN none recently Off amlodipine currently now elevated BP Denies CP, dyspnea, HA, edema, dizziness / lightheadedness   CHRONIC DM, Type 2 / nephropathy Last A1c 4.6, very well controlled. Likely with wt loss lifestyle changes Not on acei arb He checks CBG once in a while. Denies hypoglycemia, polyuria, visual changes, numbness or tingling.   HYPERLIPIDEMIA: - Reports no concerns. Last lipid panel 06/2020, controlled - Currently taking Atorvastatin 20mg  daily, tolerating well without side effects or myalgias     Additional complaint   Tinnitus Chronic problem >30 years, Takes OTC med Lipoflavinoid for supplement, mixed results. Asking about other medication.   Chronic Knee Pain Osteoarthritis / History of Chronic Gout Known history of recurrent knee pain. Previous gout flares episodic, seems improved. Today he reports recent  worsening L knee pain and stiffness. He has known arthritis. In past using Diclofenac topical PRN some relief. He is interested in steroid injection. - Previously has seen Ortho / Rheumatology - he is also taking Colchicine 0.6mg  daily for prevention   Last lab Uric Acid 7.5, (06/2020), he is doing well on Allopurinol 100mg  daily. He says sometimes flared while on his knees and overworking.     Health Maintenance:   PSA 0.77 (06/2020) negative.      10/14/2021    3:56 PM 12/26/2019    1:47 PM 06/26/2019    2:10 PM  Depression screen PHQ 2/9  Decreased Interest 0 0 0  Down, Depressed, Hopeless 0 0 0  PHQ - 2 Score 0 0 0    Past Medical History:  Diagnosis Date   Allergy    Arthritis    Gout    Hyperlipidemia    Hyperlipidemia    Hypertension    Kidney stone    Rheumatic fever    Past Surgical History:  Procedure Laterality Date   BACK SURGERY     knot     removed from neck   POSTERIOR LUMBAR FUSION 4 LEVEL N/A 10/01/2015   Procedure: Lumbar three-four,  Lumbar four-five,  Lumbar five-Sacrum one posterior lumbar interbody fusion with Laminotomy at Lumbar Two-Three;  Surgeon: Leeroy Cha, MD;  Location: [place] [facility];  Service: Neurosurgery;  Laterality: N/A;   Social History   Socioeconomic History   Marital status: Married    Spouse name: Patty   Number of children: 3  Years of education: 57   Highest education level: Not on file  Occupational History    Comment: self employed  Tobacco Use   Smoking status: Former    Types: Cigarettes   Smokeless tobacco: Former  Substance and Sexual Activity   Alcohol use: Not Currently    Alcohol/week: 3.0 standard drinks of alcohol    Types: 3 Cans of beer per week    Comment: 05/04/16 3 beers daily   Drug use: No   Sexual activity: Not on file  Other Topics Concern   Not on file  Social History Narrative   Lives with wife   Caffeine- coffee, 2 cups daily   Social Determinants of Health   Financial Resource Strain:  Not on file  Food Insecurity: Not on file  Transportation Needs: Not on file  Physical Activity: Not on file  Stress: Not on file  Social Connections: Not on file  Intimate Partner Violence: Not on file   Family History  Problem Relation Age of Onset   Parkinson's disease Father 81   Kidney disease Maternal Grandmother    Hypertension Sister    Kidney cancer Neg Hx    Prostate cancer Neg Hx    Current Outpatient Medications on File Prior to Visit  Medication Sig   acetaminophen (TYLENOL) 500 MG tablet Take 2 tablets (1,000 mg total) by mouth every 8 (eight) hours as needed.   baclofen (LIORESAL) 10 MG tablet Take 0.5-1 tablets (5-10 mg total) by mouth 3 (three) times daily as needed for muscle spasms.   cholecalciferol (VITAMIN D) 1000 units tablet Take 1,000 Units daily by mouth.   colchicine 0.6 MG tablet FOR GOUT FLARE TAKE 1 PILL THEN REPEAT WITHIN 2 HOURS, THEN MAY TAKE 1 PILL DAILY UNTIL RESOLVED FOR 7-10 DAYS.   diclofenac Sodium (VOLTAREN) 1 % GEL APPLY 2 G TOPICALLY 3 (THREE) TIMES DAILY. AS NEEDED FOR HANDS AND KNEE, ARTHRITIS.   furosemide (LASIX) 20 MG tablet Take 1-2 tablets (20-40 mg total) by mouth daily as needed for edema.   HORSE CHESTNUT PO Take by mouth.   magnesium gluconate (MAGONATE) 500 MG tablet Take 500 mg by mouth daily.   potassium citrate (UROCIT-K) 10 MEQ (1080 MG) SR tablet Take 10 mEq by mouth 3 (three) times daily with meals.   No current facility-administered medications on file prior to visit.    Review of Systems  Constitutional:  Negative for activity change, appetite change, chills, diaphoresis, fatigue and fever.  HENT:  Negative for congestion and hearing loss.   Eyes:  Negative for visual disturbance.  Respiratory:  Negative for cough, chest tightness, shortness of breath and wheezing.   Cardiovascular:  Negative for chest pain, palpitations and leg swelling.  Gastrointestinal:  Negative for abdominal pain, constipation, diarrhea, nausea  and vomiting.  Genitourinary:  Negative for dysuria, frequency and hematuria.  Musculoskeletal:  Negative for arthralgias and neck pain.  Skin:  Negative for rash.  Neurological:  Negative for dizziness, weakness, light-headedness, numbness and headaches.  Hematological:  Negative for adenopathy.  Psychiatric/Behavioral:  Negative for behavioral problems, dysphoric mood and sleep disturbance.    Per HPI unless specifically indicated above      Objective:    BP (!) 147/73   Pulse 73   Ht 5\' 10"  (1.778 m)   Wt 211 lb 9.6 oz (96 kg)   SpO2 99%   BMI 30.36 kg/m   Wt Readings from Last 3 Encounters:  10/14/21 211 lb 9.6 oz (96 kg)  06/30/20 208 lb 6.4 oz (94.5 kg)  12/26/19 202 lb (91.6 kg)    Physical Exam Vitals and nursing note reviewed.  Constitutional:      General: He is not in acute distress.    Appearance: He is well-developed. He is not diaphoretic.     Comments: Well-appearing, comfortable, cooperative  HENT:     Head: Normocephalic and atraumatic.  Eyes:     General:        Right eye: No discharge.        Left eye: No discharge.     Conjunctiva/sclera: Conjunctivae normal.     Pupils: Pupils are equal, round, and reactive to light.  Neck:     Thyroid: No thyromegaly.     Vascular: No carotid bruit.  Cardiovascular:     Rate and Rhythm: Normal rate and regular rhythm.     Pulses: Normal pulses.     Heart sounds: Normal heart sounds. No murmur heard. Pulmonary:     Effort: Pulmonary effort is normal. No respiratory distress.     Breath sounds: Normal breath sounds. No wheezing or rales.  Abdominal:     General: Bowel sounds are normal. There is no distension.     Palpations: Abdomen is soft. There is no mass.     Tenderness: There is no abdominal tenderness.  Musculoskeletal:        General: No tenderness. Normal range of motion.     Cervical back: Normal range of motion and neck supple.     Right lower leg: No edema.     Left lower leg: No edema.      Comments: Upper / Lower Extremities: - Normal muscle tone, strength bilateral upper extremities 5/5, lower extremities 5/5  Lymphadenopathy:     Cervical: No cervical adenopathy.  Skin:    General: Skin is warm and dry.     Findings: No erythema or rash.  Neurological:     Mental Status: He is alert and oriented to person, place, and time.     Comments: Distal sensation intact to light touch all extremities  Psychiatric:        Mood and Affect: Mood normal.        Behavior: Behavior normal.        Thought Content: Thought content normal.     Comments: Well groomed, good eye contact, normal speech and thoughts     Diabetic Foot Exam - Simple   Simple Foot Form Diabetic Foot exam was performed with the following findings: Yes 10/14/2021  4:03 PM  Visual Inspection See comments: Yes Sensation Testing See comments: Yes Pulse Check Posterior Tibialis and Dorsalis pulse intact bilaterally: Yes Comments Monofilament testing reduced forefoot great toes, otherwise has intact sensation. Some callus formation. No ulceration.      Results for orders placed or performed in visit on 06/20/20  TSH  Result Value Ref Range   TSH 3.04 0.40 - 4.50 mIU/L  Uric acid  Result Value Ref Range   Uric Acid, Serum 7.5 4.0 - 8.0 mg/dL  PSA  Result Value Ref Range   PSA 0.77 < OR = 4.0 ng/mL  Lipid panel  Result Value Ref Range   Cholesterol 133 <200 mg/dL   HDL 74 > OR = 40 mg/dL   Triglycerides 56 <150 mg/dL   LDL Cholesterol (Calc) 45 mg/dL (calc)   Total CHOL/HDL Ratio 1.8 <5.0 (calc)   Non-HDL Cholesterol (Calc) 59 <130 mg/dL (calc)  COMPLETE METABOLIC PANEL WITH GFR  Result Value  Ref Range   Glucose, Bld 76 65 - 99 mg/dL   BUN 18 7 - 25 mg/dL   Creat 1.14 0.70 - 1.18 mg/dL   GFR, Est Non African American 63 > OR = 60 mL/min/1.68m2   GFR, Est African American 73 > OR = 60 mL/min/1.35m2   BUN/Creatinine Ratio NOT APPLICABLE 6 - 22 (calc)   Sodium 142 135 - 146 mmol/L   Potassium 4.7  3.5 - 5.3 mmol/L   Chloride 101 98 - 110 mmol/L   CO2 31 20 - 32 mmol/L   Calcium 9.7 8.6 - 10.3 mg/dL   Total Protein 6.5 6.1 - 8.1 g/dL   Albumin 4.3 3.6 - 5.1 g/dL   Globulin 2.2 1.9 - 3.7 g/dL (calc)   AG Ratio 2.0 1.0 - 2.5 (calc)   Total Bilirubin 0.6 0.2 - 1.2 mg/dL   Alkaline phosphatase (APISO) 79 35 - 144 U/L   AST 43 (H) 10 - 35 U/L   ALT 22 9 - 46 U/L  CBC with Differential/Platelet  Result Value Ref Range   WBC 5.5 3.8 - 10.8 Thousand/uL   RBC 3.82 (L) 4.20 - 5.80 Million/uL   Hemoglobin 12.3 (L) 13.2 - 17.1 g/dL   HCT 36.6 (L) 38.5 - 50.0 %   MCV 95.8 80.0 - 100.0 fL   MCH 32.2 27.0 - 33.0 pg   MCHC 33.6 32.0 - 36.0 g/dL   RDW 13.2 11.0 - 15.0 %   Platelets 150 140 - 400 Thousand/uL   MPV 10.4 7.5 - 12.5 fL   Neutro Abs 2,651 1,500 - 7,800 cells/uL   Lymphs Abs 2,145 850 - 3,900 cells/uL   Absolute Monocytes 336 200 - 950 cells/uL   Eosinophils Absolute 297 15 - 500 cells/uL   Basophils Absolute 72 0 - 200 cells/uL   Neutrophils Relative % 48.2 %   Total Lymphocyte 39.0 %   Monocytes Relative 6.1 %   Eosinophils Relative 5.4 %   Basophils Relative 1.3 %  Hemoglobin A1c  Result Value Ref Range   Hgb A1c MFr Bld 4.6 <5.7 % of total Hgb   Mean Plasma Glucose 85 mg/dL   eAG (mmol/L) 4.7 mmol/L      Assessment & Plan:   Problem List Items Addressed This Visit     Benign hypertension with CKD (chronic kidney disease), stage II   Relevant Medications   amLODipine (NORVASC) 5 MG tablet   atorvastatin (LIPITOR) 20 MG tablet   Other Relevant Orders   COMPLETE METABOLIC PANEL WITH GFR   CBC with Differential/Platelet   BPH with obstruction/lower urinary tract symptoms   Relevant Medications   tamsulosin (FLOMAX) 0.4 MG CAPS capsule   Other Relevant Orders   PSA   Critical illness neuropathy (HCC)   Relevant Medications   pregabalin (LYRICA) 50 MG capsule   Gout   Relevant Medications   allopurinol (ZYLOPRIM) 100 MG tablet   Other Relevant Orders    Uric acid   Hyperlipidemia associated with type 2 diabetes mellitus (HCC)   Relevant Medications   amLODipine (NORVASC) 5 MG tablet   atorvastatin (LIPITOR) 20 MG tablet   Other Relevant Orders   Lipid panel   Osteoarthritis of multiple joints   Relevant Medications   allopurinol (ZYLOPRIM) 100 MG tablet   Type 2 diabetes with nephropathy (HCC)   Relevant Medications   atorvastatin (LIPITOR) 20 MG tablet   pregabalin (LYRICA) 50 MG capsule   Other Visit Diagnoses     Annual physical exam    -  Primary   Relevant Orders   COMPLETE METABOLIC PANEL WITH GFR   CBC with Differential/Platelet   Lipid panel   Hemoglobin A1c   PSA   Type 2 diabetes mellitus with other specified complication, without long-term current use of insulin (HCC)       Relevant Medications   atorvastatin (LIPITOR) 20 MG tablet   Other Relevant Orders   Hemoglobin A1c   Urine Microalbumin w/creat. ratio   Pedal edema       Elevated uric acid in blood       Relevant Medications   allopurinol (ZYLOPRIM) 100 MG tablet   Other Relevant Orders   Uric acid   Mixed hyperlipidemia       Relevant Medications   amLODipine (NORVASC) 5 MG tablet   atorvastatin (LIPITOR) 20 MG tablet   Other Relevant Orders   COMPLETE METABOLIC PANEL WITH GFR   Lipid panel   Need for pneumococcal vaccine       Relevant Orders   Pneumococcal conjugate vaccine 20-valent       Updated Health Maintenance information Reviewed recent lab results with patient Encouraged improvement to lifestyle with diet and exercise Goal of weight loss   Meds ordered this encounter  Medications   tamsulosin (FLOMAX) 0.4 MG CAPS capsule    Sig: Take 1 capsule (0.4 mg total) by mouth daily.    Dispense:  30 capsule    Refill:  3   amLODipine (NORVASC) 5 MG tablet    Sig: Take 1 tablet (5 mg total) by mouth daily.    Dispense:  90 tablet    Refill:  3    Add refills   allopurinol (ZYLOPRIM) 100 MG tablet    Sig: Take 1 tablet (100 mg  total) by mouth daily.    Dispense:  90 tablet    Refill:  3   atorvastatin (LIPITOR) 20 MG tablet    Sig: Take 1 tablet (20 mg total) by mouth daily at 6 PM.    Dispense:  90 tablet    Refill:  3   pregabalin (LYRICA) 50 MG capsule    Sig: Take 1 capsule (50 mg total) by mouth 2 (two) times daily.    Dispense:  60 capsule    Refill:  2     Follow up plan: No follow-ups on file.  [name], DO [facility] 10/14/2021, 3:43 PM

## 2021-10-15 LAB — CBC WITH DIFFERENTIAL/PLATELET
Absolute Monocytes: 445 cells/uL (ref 200–950)
Basophils Absolute: 61 cells/uL (ref 0–200)
Basophils Relative: 1 %
Eosinophils Absolute: 183 cells/uL (ref 15–500)
Eosinophils Relative: 3 %
HCT: 37.2 % — ABNORMAL LOW (ref 38.5–50.0)
Hemoglobin: 12.5 g/dL — ABNORMAL LOW (ref 13.2–17.1)
Lymphs Abs: 2166 cells/uL (ref 850–3900)
MCH: 31.6 pg (ref 27.0–33.0)
MCHC: 33.6 g/dL (ref 32.0–36.0)
MCV: 94.2 fL (ref 80.0–100.0)
MPV: 9.7 fL (ref 7.5–12.5)
Monocytes Relative: 7.3 %
Neutro Abs: 3245 cells/uL (ref 1500–7800)
Neutrophils Relative %: 53.2 %
Platelets: 170 10*3/uL (ref 140–400)
RBC: 3.95 10*6/uL — ABNORMAL LOW (ref 4.20–5.80)
RDW: 13.5 % (ref 11.0–15.0)
Total Lymphocyte: 35.5 %
WBC: 6.1 10*3/uL (ref 3.8–10.8)

## 2021-10-15 LAB — MICROALBUMIN / CREATININE URINE RATIO
Creatinine, Urine: 85 mg/dL (ref 20–320)
Microalb Creat Ratio: 44 mcg/mg creat — ABNORMAL HIGH (ref <30)
Microalb, Ur: 3.7 mg/dL

## 2021-10-15 LAB — COMPLETE METABOLIC PANEL WITH GFR
AG Ratio: 1.9 (calc) (ref 1.0–2.5)
ALT: 22 U/L (ref 9–46)
AST: 36 U/L — ABNORMAL HIGH (ref 10–35)
Albumin: 4.2 g/dL (ref 3.6–5.1)
Alkaline phosphatase (APISO): 81 U/L (ref 35–144)
BUN/Creatinine Ratio: 21 (calc) (ref 6–22)
BUN: 29 mg/dL — ABNORMAL HIGH (ref 7–25)
CO2: 28 mmol/L (ref 20–32)
Calcium: 9.3 mg/dL (ref 8.6–10.3)
Chloride: 101 mmol/L (ref 98–110)
Creat: 1.41 mg/dL — ABNORMAL HIGH (ref 0.70–1.28)
Globulin: 2.2 g/dL (calc) (ref 1.9–3.7)
Glucose, Bld: 81 mg/dL (ref 65–99)
Potassium: 4 mmol/L (ref 3.5–5.3)
Sodium: 139 mmol/L (ref 135–146)
Total Bilirubin: 0.8 mg/dL (ref 0.2–1.2)
Total Protein: 6.4 g/dL (ref 6.1–8.1)
eGFR: 52 mL/min/{1.73_m2} — ABNORMAL LOW (ref >=60)

## 2021-10-15 LAB — HEMOGLOBIN A1C
Hgb A1c MFr Bld: 4.8 % of total Hgb (ref <5.7)
Mean Plasma Glucose: 91 mg/dL
eAG (mmol/L): 5 mmol/L

## 2021-10-15 LAB — LIPID PANEL
Cholesterol: 135 mg/dL (ref <200)
HDL: 61 mg/dL (ref >=40)
LDL Cholesterol (Calc): 57 mg/dL (calc)
Non-HDL Cholesterol (Calc): 74 mg/dL (calc) (ref <130)
Total CHOL/HDL Ratio: 2.2 (calc) (ref <5.0)
Triglycerides: 88 mg/dL (ref <150)

## 2021-10-15 LAB — PSA: PSA: 0.76 ng/mL (ref <=4.00)

## 2021-10-15 LAB — URIC ACID: Uric Acid, Serum: 8.2 mg/dL — ABNORMAL HIGH (ref 4.0–8.0)

## 2021-10-15 NOTE — Assessment & Plan Note (Signed)
Chronic problem since 2017 Failed gabapentin Tried Alpha lipoic acid limited results Trial on Lyrica titration today 50 BID

## 2021-10-15 NOTE — Assessment & Plan Note (Signed)
Mildly elevated initial BP, repeat manual check improved Complication CKD-II  Off med today   Plan:  1. Continue current BP regimen - Amlodipine 5mg  daily, taking Lasix PRN 2. Encourage improved lifestyle - low sodium diet, regular exercise 3. Continue monitor BP outside office, bring readings to next visit, if persistently >140/90 or new symptoms notify office sooner

## 2021-10-15 NOTE — Assessment & Plan Note (Signed)
Controlled cholesterol on statin and lifestyle wt loss Last lipid panel 06/2020 Check lipid today  Plan: 1. Continue current meds - Atorvastatin 20mg  2. Encourage improved lifestyle - low carb/cholesterol, reduce portion size, continue improving regular exercise

## 2021-10-15 NOTE — Assessment & Plan Note (Signed)
Chronic BPH, mild improved seems stable No alpha blocker - Last PSA 0.7 (06/2020) - No known personal/family history of prostate CA  Plan: DC Super Beta Prostate Start Tamsulosin 0.4 today Follow up if worsening or new concerns

## 2021-10-15 NOTE — Assessment & Plan Note (Signed)
Well-controlled DM with A1c 4.6, previously Complications - CKD-II, peripheral neuropathy (not necessarily 2/2 DM - with critical illness neuropathy)  Plan:  1. Not on therapy 2. Encourage improved lifestyle - low carb, low sugar diet, reduce portion size, start regular exercise 3. On Statin

## 2021-10-28 ENCOUNTER — Other Ambulatory Visit: Payer: Self-pay | Admitting: Family Medicine

## 2021-10-28 DIAGNOSIS — R600 Localized edema: Secondary | ICD-10-CM

## 2021-10-28 NOTE — Telephone Encounter (Signed)
Requested medication (s) are due for refill today expired Rx  Requested medication (s) are on the active medication list -yes  Future visit scheduled -yes  Last refill: 12/26/19 #180 1RF  Notes to clinic: expired Rx  Requested Prescriptions  Pending Prescriptions Disp Refills   furosemide (LASIX) 20 MG tablet [Pharmacy Med Name: FUROSEMIDE 20 MG TABLET] 180 tablet 1    Sig: Take 1-2 tablets (20-40 mg total) by mouth daily as needed for edema.     Cardiovascular:  Diuretics - Loop Failed - 10/28/2021  1:14 PM      Failed - Cr in normal range and within 180 days    Creat  Date Value Ref Range Status  10/14/2021 1.41 (H) 0.70 - 1.28 mg/dL Final   Creatinine, Urine  Date Value Ref Range Status  10/14/2021 85 20 - 320 mg/dL Final         Failed - Mg Level in normal range and within 180 days    Magnesium  Date Value Ref Range Status  10/15/2015 1.9 1.7 - 2.4 mg/dL Final         Failed - Last BP in normal range    BP Readings from Last 1 Encounters:  10/14/21 (!) 147/73         Passed - K in normal range and within 180 days    Potassium  Date Value Ref Range Status  10/14/2021 4.0 3.5 - 5.3 mmol/L Final         Passed - Ca in normal range and within 180 days    Calcium  Date Value Ref Range Status  10/14/2021 9.3 8.6 - 10.3 mg/dL Final         Passed - Na in normal range and within 180 days    Sodium  Date Value Ref Range Status  10/14/2021 139 135 - 146 mmol/L Final  02/26/2015 141 136 - 144 mmol/L Final    Comment:    **Effective March 03, 2015 the reference interval**   for Sodium, Serum will be changing to:                                             134 - 144          Passed - Cl in normal range and within 180 days    Chloride  Date Value Ref Range Status  10/14/2021 101 98 - 110 mmol/L Final         Passed - Valid encounter within last 6 months    Recent Outpatient Visits           2 weeks ago Annual physical exam   [facility]  Smitty Cords, DO   1 year ago Annual physical exam   [facility] Smitty Cords, DO   1 year ago Benign hypertension with CKD (chronic kidney disease), stage II   [name], Netta Neat, DO   2 years ago Obesity (BMI 30.0-34.9)   [facility] Althea Charon, Netta Neat, DO   2 years ago Annual physical exam   [facility] Althea Charon, Netta Neat, DO       Future Appointments             In 2 months Althea Charon, Netta Neat, DO [facility]  Requested Prescriptions  Pending Prescriptions Disp Refills   furosemide (LASIX) 20 MG tablet [Pharmacy Med Name: FUROSEMIDE 20 MG TABLET] 180 tablet 1    Sig: Take 1-2 tablets (20-40 mg total) by mouth daily as needed for edema.     Cardiovascular:  Diuretics - Loop Failed - 10/28/2021  1:14 PM      Failed - Cr in normal range and within 180 days    Creat  Date Value Ref Range Status  10/14/2021 1.41 (H) 0.70 - 1.28 mg/dL Final   Creatinine, Urine  Date Value Ref Range Status  10/14/2021 85 20 - 320 mg/dL Final         Failed - Mg Level in normal range and within 180 days    Magnesium  Date Value Ref Range Status  10/15/2015 1.9 1.7 - 2.4 mg/dL Final         Failed - Last BP in normal range    BP Readings from Last 1 Encounters:  10/14/21 (!) 147/73         Passed - K in normal range and within 180 days    Potassium  Date Value Ref Range Status  10/14/2021 4.0 3.5 - 5.3 mmol/L Final         Passed - Ca in normal range and within 180 days    Calcium  Date Value Ref Range Status  10/14/2021 9.3 8.6 - 10.3 mg/dL Final         Passed - Na in normal range and within 180 days    Sodium  Date Value Ref Range Status  10/14/2021 139 135 - 146 mmol/L Final  02/26/2015 141 136 - 144 mmol/L Final    Comment:    **Effective March 03, 2015 the reference interval**   for Sodium, Serum will be  changing to:                                             134 - 144          Passed - Cl in normal range and within 180 days    Chloride  Date Value Ref Range Status  10/14/2021 101 98 - 110 mmol/L Final         Passed - Valid encounter within last 6 months    Recent Outpatient Visits           2 weeks ago Annual physical exam   [facility] Smitty Cords, DO   1 year ago Annual physical exam   [facility] Smitty Cords, DO   1 year ago Benign hypertension with CKD (chronic kidney disease), stage II   [facility], Netta Neat, DO   2 years ago Obesity (BMI 30.0-34.9)   [facility] Althea Charon, Netta Neat, DO   2 years ago Annual physical exam   [facility] Althea Charon, Netta Neat, DO       Future Appointments             In 2 months Althea Charon, Netta Neat, DO [facility]

## 2021-11-05 ENCOUNTER — Telehealth: Payer: Self-pay

## 2021-11-05 NOTE — Patient Outreach (Signed)
  Care Coordination   11/05/2021 Name: Danny Patterson MRN: [id number] DOB: 06-Jan-1946   Care Coordination Outreach Attempts:  An unsuccessful telephone outreach was attempted today to offer the patient information about available care coordination services as a benefit of their health plan.   Follow Up Plan:  Additional outreach attempts will be made to offer the patient care coordination information and services.   Encounter Outcome:  No Answer  Care Coordination Interventions Activated:  No   Care Coordination Interventions:  No, not indicated    Alto Denver RN, MSN, CCM Community Care Coordinator [facility]  [facility] Mobile: [phone]

## 2021-11-26 DIAGNOSIS — H2513 Age-related nuclear cataract, bilateral: Secondary | ICD-10-CM | POA: Diagnosis not present

## 2022-01-15 ENCOUNTER — Encounter: Payer: Self-pay | Admitting: Family Medicine

## 2022-01-15 ENCOUNTER — Ambulatory Visit (INDEPENDENT_AMBULATORY_CARE_PROVIDER_SITE_OTHER): Payer: PPO | Admitting: Family Medicine

## 2022-01-15 VITALS — BP 143/58 | HR 81 | Ht 70.0 in | Wt 223.0 lb

## 2022-01-15 DIAGNOSIS — G6281 Critical illness polyneuropathy: Secondary | ICD-10-CM

## 2022-01-15 DIAGNOSIS — N401 Enlarged prostate with lower urinary tract symptoms: Secondary | ICD-10-CM

## 2022-01-15 DIAGNOSIS — M25562 Pain in left knee: Secondary | ICD-10-CM | POA: Diagnosis not present

## 2022-01-15 DIAGNOSIS — N138 Other obstructive and reflux uropathy: Secondary | ICD-10-CM

## 2022-01-15 DIAGNOSIS — M25561 Pain in right knee: Secondary | ICD-10-CM | POA: Diagnosis not present

## 2022-01-15 DIAGNOSIS — M1A0790 Idiopathic chronic gout, unspecified ankle and foot, without tophus (tophi): Secondary | ICD-10-CM

## 2022-01-15 DIAGNOSIS — I129 Hypertensive chronic kidney disease with stage 1 through stage 4 chronic kidney disease, or unspecified chronic kidney disease: Secondary | ICD-10-CM

## 2022-01-15 DIAGNOSIS — N182 Chronic kidney disease, stage 2 (mild): Secondary | ICD-10-CM | POA: Diagnosis not present

## 2022-01-15 DIAGNOSIS — M159 Polyosteoarthritis, unspecified: Secondary | ICD-10-CM

## 2022-01-15 DIAGNOSIS — G8929 Other chronic pain: Secondary | ICD-10-CM | POA: Diagnosis not present

## 2022-01-15 MED ORDER — BACLOFEN 10 MG PO TABS: 5.0000 mg | ORAL_TABLET | Freq: Three times a day (TID) | ORAL | 5 refills | Status: DC | PRN

## 2022-01-15 MED ORDER — TAMSULOSIN HCL 0.4 MG PO CAPS: 0.4000 mg | ORAL_CAPSULE | Freq: Every day | ORAL | 5 refills | Status: DC

## 2022-01-15 MED ORDER — ALLOPURINOL 300 MG PO TABS: 300.0000 mg | ORAL_TABLET | Freq: Every day | ORAL | 5 refills | Status: DC

## 2022-01-15 NOTE — Progress Notes (Unsigned)
Subjective:    Patient ID: Danny Patterson, male    DOB: Dec 31, 1945, 76 y.o.   MRN: [id number]  Danny Patterson is a 76 y.o. 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 Not interested in other procedural intervention. Asking about nerve pain medication.  ***Failed Lyrica, caused muscle jerking ***Muscle stiffness, needs to re try Baclofen again ***Gout - needs higher dose Allopurinol previously 170m daily, switch to 3059m***DC Colchicine causes diarrhea side effect   BPH Nocturia, urinary frequency He can have some good urinary flow at times.  Previously with nephrolithiasis he was on K Citrate from Urologist, but stopped this didn't follow up He tried Super Beta Prostate says not as effective He continues K Citrate      10/14/2021    3:56 PM 12/26/2019    1:47 PM 06/26/2019    2:10 PM  Depression screen PHQ 2/9  Decreased Interest 0 0 0  Down, Depressed, Hopeless 0 0 0  PHQ - 2 Score 0 0 0    Social History   Tobacco Use   Smoking status: Former    Types: Cigarettes   Smokeless tobacco: Former  Substance Use Topics   Alcohol use: Not Currently    Alcohol/week: 3.0 standard drinks of alcohol    Types: 3 Cans of beer per week    Comment: 05/04/16 3 beers daily   Drug use: No    Review of Systems Per HPI unless specifically indicated above     Objective:    BP (!) 143/58   Pulse 81   Ht _0  (1.778 m)   Wt 223 lb (101.2 kg)   SpO2 98%   BMI 32.00 kg/m   Wt Readings from Last 3 Encounters:  01/15/22 223 lb (101.2 kg)  10/14/21 211 lb 9.6 oz (96 kg)  06/30/20 208 lb 6.4 oz (94.5 kg)    Physical Exam Results for orders placed or performed in visit on 10/14/21   Uric acid  Result Value Ref Range   Uric Acid, Serum 8.2 (H) 4.0 - 8.0 mg/dL  COMPLETE METABOLIC PANEL WITH GFR  Result Value Ref Range   Glucose, Bld 81 65 - 99 mg/dL   BUN 29 (H) 7 - 25 mg/dL   Creat 1.41 (H) 0.70 - 1.28 mg/dL   eGFR 52 (L) > OR = 60 mL/min/1.7366m BUN/Creatinine Ratio 21 6 - 22 (calc)   Sodium 139 135 - 146 mmol/L   Potassium 4.0 3.5 - 5.3 mmol/L   Chloride 101 98 - 110 mmol/L   CO2 28 20 - 32 mmol/L   Calcium 9.3 8.6 - 10.3 mg/dL   Total Protein 6.4 6.1 - 8.1 g/dL   Albumin 4.2 3.6 - 5.1 g/dL   Globulin 2.2 1.9 - 3.7 g/dL (calc)   AG Ratio 1.9 1.0 - 2.5 (calc)   Total Bilirubin 0.8 0.2 - 1.2 mg/dL   Alkaline phosphatase (APISO) 81 35 - 144 U/L   AST 36 (H) 10 - 35 U/L   ALT 22 9 - 46 U/L  CBC with Differential/Platelet  Result Value Ref Range   WBC 6.1 3.8 - 10.8 Thousand/uL   RBC 3.95 (L) 4.20 - 5.80 Million/uL  Hemoglobin 12.5 (L) 13.2 - 17.1 g/dL   HCT 37.2 (L) 38.5 - 50.0 %   MCV 94.2 80.0 - 100.0 fL   MCH 31.6 27.0 - 33.0 pg   MCHC 33.6 32.0 - 36.0 g/dL   RDW 13.5 11.0 - 15.0 %   Platelets 170 140 - 400 Thousand/uL   MPV 9.7 7.5 - 12.5 fL   Neutro Abs 3,245 1,500 - 7,800 cells/uL   Lymphs Abs 2,166 850 - 3,900 cells/uL   Absolute Monocytes 445 200 - 950 cells/uL   Eosinophils Absolute 183 15 - 500 cells/uL   Basophils Absolute 61 0 - 200 cells/uL   Neutrophils Relative % 53.2 %   Total Lymphocyte 35.5 %   Monocytes Relative 7.3 %   Eosinophils Relative 3.0 %   Basophils Relative 1.0 %  Lipid panel  Result Value Ref Range   Cholesterol 135 <200 mg/dL   HDL 61 > OR = 40 mg/dL   Triglycerides 88 <150 mg/dL   LDL Cholesterol (Calc) 57 mg/dL (calc)   Total CHOL/HDL Ratio 2.2 <5.0 (calc)   Non-HDL Cholesterol (Calc) 74 <130 mg/dL (calc)  Hemoglobin A1c  Result Value Ref Range   Hgb A1c MFr Bld 4.8 <5.7 % of total Hgb   Mean Plasma Glucose 91 mg/dL   eAG (mmol/L) 5.0 mmol/L  PSA  Result Value Ref Range   PSA 0.76 < OR = 4.00 ng/mL   Urine Microalbumin w/creat. ratio  Result Value Ref Range   Creatinine, Urine 85 20 - 320 mg/dL   Microalb, Ur 3.7 mg/dL   Microalb Creat Ratio 44 (H) <30 mcg/mg creat      Assessment & Plan:   Problem List Items Addressed This Visit   None   No orders of the defined types were placed in this encounter.     Follow up plan: No follow-ups on file.  ***Future orders uric acid, B12, chemistry  [name], DO [facility] 01/15/2022, 1:46 PM

## 2022-01-15 NOTE — Patient Instructions (Addendum)
Thank you for coming to the office today.  Check your VItamin B - make sure it includes B12. Looking for about 500 to 1000 per day If you do not have it, then add a separate Vitamin B12 1064mcg daily  Remain off Lyrica Pregabalin due to side effect.  Restart Baclofen for muscle spasm and stiffness  Restart Tamsulosin Flomax for prostate  Increase Allopurinol from 100mg  to 300mg  daily for gout.   Please schedule a Follow-up Appointment to: Return in about 3 months (around 04/17/2022) for 3 month non fasting blood work then 1 week later Follow-up Labs, Gout, BPH, Neuropathy.  If you have any other questions or concerns, please feel free to call the office or send a message through [facility]. You may also schedule an earlier appointment if necessary.  Additionally, you may be receiving a survey about your experience at our office within a few days to 1 week by e-mail or mail. We value your feedback.  [name], DO [place]

## 2022-01-16 ENCOUNTER — Other Ambulatory Visit: Payer: Self-pay | Admitting: Family Medicine

## 2022-01-16 DIAGNOSIS — N182 Chronic kidney disease, stage 2 (mild): Secondary | ICD-10-CM

## 2022-01-16 DIAGNOSIS — M1A0790 Idiopathic chronic gout, unspecified ankle and foot, without tophus (tophi): Secondary | ICD-10-CM

## 2022-01-16 DIAGNOSIS — E790 Hyperuricemia without signs of inflammatory arthritis and tophaceous disease: Secondary | ICD-10-CM

## 2022-01-16 DIAGNOSIS — E538 Deficiency of other specified B group vitamins: Secondary | ICD-10-CM

## 2022-03-28 ENCOUNTER — Emergency Department: Payer: PPO

## 2022-03-28 ENCOUNTER — Observation Stay
Admission: EM | Admit: 2022-03-28 | Discharge: 2022-03-29 | Disposition: A | Discharge status: Home / Self Care | Payer: PPO | Attending: Obstetrics and Gynecology | Admitting: Obstetrics and Gynecology

## 2022-03-28 ENCOUNTER — Other Ambulatory Visit: Payer: Self-pay

## 2022-03-28 DIAGNOSIS — D631 Anemia in chronic kidney disease: Secondary | ICD-10-CM | POA: Insufficient documentation

## 2022-03-28 DIAGNOSIS — E1122 Type 2 diabetes mellitus with diabetic chronic kidney disease: Secondary | ICD-10-CM | POA: Diagnosis not present

## 2022-03-28 DIAGNOSIS — Z79899 Other long term (current) drug therapy: Secondary | ICD-10-CM | POA: Insufficient documentation

## 2022-03-28 DIAGNOSIS — F1020 Alcohol dependence, uncomplicated: Secondary | ICD-10-CM | POA: Insufficient documentation

## 2022-03-28 DIAGNOSIS — R0902 Hypoxemia: Secondary | ICD-10-CM | POA: Diagnosis not present

## 2022-03-28 DIAGNOSIS — N1831 Chronic kidney disease, stage 3a: Secondary | ICD-10-CM | POA: Diagnosis not present

## 2022-03-28 DIAGNOSIS — R55 Syncope and collapse: Secondary | ICD-10-CM | POA: Diagnosis not present

## 2022-03-28 DIAGNOSIS — E119 Type 2 diabetes mellitus without complications: Secondary | ICD-10-CM

## 2022-03-28 DIAGNOSIS — G4733 Obstructive sleep apnea (adult) (pediatric): Secondary | ICD-10-CM | POA: Diagnosis present

## 2022-03-28 DIAGNOSIS — F0280 Dementia in other diseases classified elsewhere without behavioral disturbance: Secondary | ICD-10-CM | POA: Diagnosis present

## 2022-03-28 DIAGNOSIS — N138 Other obstructive and reflux uropathy: Secondary | ICD-10-CM | POA: Diagnosis present

## 2022-03-28 DIAGNOSIS — Z87891 Personal history of nicotine dependence: Secondary | ICD-10-CM | POA: Insufficient documentation

## 2022-03-28 DIAGNOSIS — R61 Generalized hyperhidrosis: Secondary | ICD-10-CM | POA: Diagnosis not present

## 2022-03-28 DIAGNOSIS — Z9889 Other specified postprocedural states: Secondary | ICD-10-CM | POA: Insufficient documentation

## 2022-03-28 DIAGNOSIS — F1090 Alcohol use, unspecified, uncomplicated: Secondary | ICD-10-CM | POA: Insufficient documentation

## 2022-03-28 DIAGNOSIS — E1169 Type 2 diabetes mellitus with other specified complication: Secondary | ICD-10-CM

## 2022-03-28 DIAGNOSIS — G3183 Dementia with Lewy bodies: Secondary | ICD-10-CM | POA: Diagnosis not present

## 2022-03-28 DIAGNOSIS — I129 Hypertensive chronic kidney disease with stage 1 through stage 4 chronic kidney disease, or unspecified chronic kidney disease: Secondary | ICD-10-CM | POA: Diagnosis not present

## 2022-03-28 DIAGNOSIS — G8929 Other chronic pain: Secondary | ICD-10-CM

## 2022-03-28 DIAGNOSIS — W44F3XA Food entering into or through a natural orifice, initial encounter: Secondary | ICD-10-CM

## 2022-03-28 DIAGNOSIS — I959 Hypotension, unspecified: Secondary | ICD-10-CM | POA: Diagnosis not present

## 2022-03-28 DIAGNOSIS — D649 Anemia, unspecified: Secondary | ICD-10-CM | POA: Diagnosis present

## 2022-03-28 DIAGNOSIS — R9089 Other abnormal findings on diagnostic imaging of central nervous system: Secondary | ICD-10-CM

## 2022-03-28 DIAGNOSIS — Z981 Arthrodesis status: Secondary | ICD-10-CM

## 2022-03-28 DIAGNOSIS — N400 Enlarged prostate without lower urinary tract symptoms: Secondary | ICD-10-CM | POA: Insufficient documentation

## 2022-03-28 DIAGNOSIS — M4326 Fusion of spine, lumbar region: Secondary | ICD-10-CM | POA: Diagnosis not present

## 2022-03-28 DIAGNOSIS — M542 Cervicalgia: Secondary | ICD-10-CM

## 2022-03-28 DIAGNOSIS — I10 Essential (primary) hypertension: Secondary | ICD-10-CM | POA: Insufficient documentation

## 2022-03-28 DIAGNOSIS — E669 Obesity, unspecified: Secondary | ICD-10-CM | POA: Diagnosis present

## 2022-03-28 LAB — CBC WITH DIFFERENTIAL/PLATELET
Abs Immature Granulocytes: 0.01 10*3/uL (ref 0.00–0.07)
Basophils Absolute: 0.1 10*3/uL (ref 0.0–0.1)
Basophils Relative: 2 %
Eosinophils Absolute: 0.2 10*3/uL (ref 0.0–0.5)
Eosinophils Relative: 4 %
HCT: 33.6 % — ABNORMAL LOW (ref 39.0–52.0)
Hemoglobin: 11.2 g/dL — ABNORMAL LOW (ref 13.0–17.0)
Immature Granulocytes: 0 %
Lymphocytes Relative: 36 %
Lymphs Abs: 2.1 10*3/uL (ref 0.7–4.0)
MCH: 31.8 pg (ref 26.0–34.0)
MCHC: 33.3 g/dL (ref 30.0–36.0)
MCV: 95.5 fL (ref 80.0–100.0)
Monocytes Absolute: 0.4 10*3/uL (ref 0.1–1.0)
Monocytes Relative: 7 %
Neutro Abs: 3 10*3/uL (ref 1.7–7.7)
Neutrophils Relative %: 51 %
Platelets: 158 10*3/uL (ref 150–400)
RBC: 3.52 MIL/uL — ABNORMAL LOW (ref 4.22–5.81)
RDW: 13.9 % (ref 11.5–15.5)
WBC: 5.8 10*3/uL (ref 4.0–10.5)
nRBC: 0 % (ref 0.0–0.2)

## 2022-03-28 LAB — COMPREHENSIVE METABOLIC PANEL
ALT: 27 U/L (ref 0–44)
AST: 47 U/L — ABNORMAL HIGH (ref 15–41)
Albumin: 3.3 g/dL — ABNORMAL LOW (ref 3.5–5.0)
Alkaline Phosphatase: 69 U/L (ref 38–126)
Anion gap: 13 (ref 5–15)
BUN: 22 mg/dL (ref 8–23)
CO2: 20 mmol/L — ABNORMAL LOW (ref 22–32)
Calcium: 8.4 mg/dL — ABNORMAL LOW (ref 8.9–10.3)
Chloride: 103 mmol/L (ref 98–111)
Creatinine, Ser: 1.41 mg/dL — ABNORMAL HIGH (ref 0.61–1.24)
GFR, Estimated: 52 mL/min — ABNORMAL LOW (ref >60)
Glucose, Bld: 100 mg/dL — ABNORMAL HIGH (ref 70–99)
Potassium: 3.8 mmol/L (ref 3.5–5.1)
Sodium: 136 mmol/L (ref 135–145)
Total Bilirubin: 1 mg/dL (ref 0.3–1.2)
Total Protein: 5.9 g/dL — ABNORMAL LOW (ref 6.5–8.1)

## 2022-03-28 LAB — TROPONIN I (HIGH SENSITIVITY): Troponin I (High Sensitivity): 11 ng/L (ref <18)

## 2022-03-28 LAB — LIPASE, BLOOD: Lipase: 44 U/L (ref 11–51)

## 2022-03-28 MED ORDER — DIPHENHYDRAMINE HCL 50 MG/ML IJ SOLN
INTRAMUSCULAR | Status: AC
  Filled 2022-03-28: qty 1

## 2022-03-28 MED ORDER — ONDANSETRON HCL 4 MG/2ML IJ SOLN: 4.0000 mg | Freq: Once | INTRAMUSCULAR | Status: DC

## 2022-03-28 MED ORDER — DIPHENHYDRAMINE HCL 50 MG/ML IJ SOLN
25.0000 mg | INTRAMUSCULAR | Status: AC
  Administered 2022-03-28: 25 mg via INTRAVENOUS

## 2022-03-28 NOTE — ED Triage Notes (Addendum)
Arrives EMS from home a reported possible syncopal episode witnessed by family while sitting at the dinner table. Family guesstimate several minute LOC.   Pt reports feeling "dizzyheaded" and getting sick.   EMS state bp 60/40. 600cc NS admin enroute with improvement of sbp 100.   Admits to 2 beers at dinner.

## 2022-03-28 NOTE — ED Notes (Signed)
Pt told this RN and MD that he felt like his tongue was swelling "it's done this before". No obvious swelling noted to this RN or MD but prophylactic benadryl was ordered and administered.

## 2022-03-28 NOTE — ED Notes (Signed)
Family now at the bedside, sts that pt had been c/o head pain prior to the syncopal episode. MD notified and new orders received.

## 2022-03-28 NOTE — ED Provider Notes (Signed)
[facility] Provider Note    Event Date/Time   First MD Initiated Contact with Patient 03/28/22 2221     (approximate)   History   Chief Complaint: Syncopal Episode   HPI  Danny Patterson is a 77 y.o. male with a history of CKD, diabetes, hypertension, gout who is brought to the ED due to syncope.  Patient was in his usual state of health, ate dinner and drank 2 beers, and while at the table said he started feeling dizzy and nauseated.  Then got lightheaded and was observed to slump over for about 1 minute.  He was carefully lowered to the ground.  No vomiting.  Patient denies any pain including chest pain abdominal pain shortness of breath or headache.  However, when family arrived they note that he was mentioning some headache prior to the episode, not severe or thunderclap.  Patient reports the headache is currently resolved.     Physical Exam   Triage Vital Signs: ED Triage Vitals  Enc Vitals Group     BP 03/28/22 2222 (!) 151/78     Pulse Rate 03/28/22 2222 71     Resp 03/28/22 2222 20     Temp 03/28/22 2222 (!) 97.4 F (36.3 C)     Temp Source 03/28/22 2222 Oral     SpO2 03/28/22 2222 100 %     Weight 03/28/22 2223 220 lb (99.8 kg)     Height 03/28/22 2223 5\' 10"  (1.778 m)     Head Circumference --      Peak Flow --      Pain Score 03/28/22 2223 0     Pain Loc --      Pain Edu? --      Excl. in GC? --     Most recent vital signs: Vitals:   03/28/22 2300 03/28/22 2330  BP: (!) 144/76 (!) 148/79  Pulse: 70 67  Resp: 20 15  Temp:    SpO2: 100% 97%    General: Awake, no distress.  CV:  Good peripheral perfusion.  Regular rate and rhythm.  Normal distal pulses Resp:  Normal effort.  Clear to auscultation bilaterally Abd:  No distention.  Soft nontender Other:  Symmetric calf circumference no calf tenderness.  Moist oral mucosa.  No oral swelling.   ED Results / Procedures / Treatments   Labs (all labs ordered are listed, but only  abnormal results are displayed) Labs Reviewed  COMPREHENSIVE METABOLIC PANEL - Abnormal; Notable for the following components:      Result Value   CO2 20 (*)    Glucose, Bld 100 (*)    Creatinine, Ser 1.41 (*)    Calcium 8.4 (*)    Total Protein 5.9 (*)    Albumin 3.3 (*)    AST 47 (*)    GFR, Estimated 52 (*)    All other components within normal limits  CBC WITH DIFFERENTIAL/PLATELET - Abnormal; Notable for the following components:   RBC 3.52 (*)    Hemoglobin 11.2 (*)    HCT 33.6 (*)    All other components within normal limits  LIPASE, BLOOD  URINALYSIS, ROUTINE W REFLEX MICROSCOPIC  TROPONIN I (HIGH SENSITIVITY)     EKG Interpreted by me Sinus rhythm rate of 64.  Normal axis, right bundle branch block.  No acute ischemic changes.   RADIOLOGY CT head interpreted by me, negative for intracranial hemorrhage or large infarct.  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  MRI recommended to assess recency of the event.   PROCEDURES:  Procedures   MEDICATIONS ORDERED IN ED: Medications  ondansetron (ZOFRAN) injection 4 mg (has no administration in time range)  diphenhydrAMINE (BENADRYL) 50 MG/ML injection (  Not Given 03/28/22 2227)  diphenhydrAMINE (BENADRYL) injection 25 mg (25 mg Intravenous Given 03/28/22 2225)     IMPRESSION / MDM / ASSESSMENT AND PLAN / ED COURSE  I reviewed the triage vital signs and the nursing notes.                              Differential diagnosis includes, but is not limited to, vagal episode, intoxication, dehydration, orthostasis, electrolyte abnormality, anemia, non-STEMI, cerebral hemorrhage  Patient's presentation is most consistent with acute presentation with potential threat to life or bodily function.  Patient presents with an episode of syncope, associated with a nonsevere headache and dizziness.  Symptoms have resolved.  Vital signs unremarkable in the ED.  Patient denies any current  symptoms.  Will obtain labs, CT head   Clinical Course as of 03/28/22 2353  Nancy Fetter Mar 28, 2022  2341 CT head discussed with radiology, concerning for a small R parietal hypodensity consistent with infarct, new since 2017.  They recommend MRI to assess acuity of the lesion. [PS]    Clinical Course User Index [PS] Carrie Mew, MD     FINAL CLINICAL IMPRESSION(S) / ED DIAGNOSES   Final diagnoses:  Syncope, unspecified syncope type     Rx / DC Orders   ED Discharge Orders     None        Note:  This document was prepared using Dragon voice recognition software and may include unintentional dictation errors.   Carrie Mew, MD 03/28/22 [phone]

## 2022-03-28 NOTE — ED Notes (Signed)
ED Provider Dr. Jari Pigg at bedside.

## 2022-03-29 ENCOUNTER — Observation Stay: Payer: PPO

## 2022-03-29 DIAGNOSIS — R059 Cough, unspecified: Secondary | ICD-10-CM | POA: Diagnosis not present

## 2022-03-29 DIAGNOSIS — I517 Cardiomegaly: Secondary | ICD-10-CM | POA: Diagnosis not present

## 2022-03-29 DIAGNOSIS — I6523 Occlusion and stenosis of bilateral carotid arteries: Secondary | ICD-10-CM | POA: Diagnosis not present

## 2022-03-29 DIAGNOSIS — M542 Cervicalgia: Secondary | ICD-10-CM

## 2022-03-29 DIAGNOSIS — I10 Essential (primary) hypertension: Secondary | ICD-10-CM | POA: Insufficient documentation

## 2022-03-29 DIAGNOSIS — G8929 Other chronic pain: Secondary | ICD-10-CM

## 2022-03-29 DIAGNOSIS — R9089 Other abnormal findings on diagnostic imaging of central nervous system: Secondary | ICD-10-CM

## 2022-03-29 DIAGNOSIS — F1090 Alcohol use, unspecified, uncomplicated: Secondary | ICD-10-CM | POA: Insufficient documentation

## 2022-03-29 DIAGNOSIS — E785 Hyperlipidemia, unspecified: Secondary | ICD-10-CM | POA: Diagnosis not present

## 2022-03-29 DIAGNOSIS — R55 Syncope and collapse: Secondary | ICD-10-CM | POA: Diagnosis present

## 2022-03-29 DIAGNOSIS — W44F3XA Food entering into or through a natural orifice, initial encounter: Secondary | ICD-10-CM

## 2022-03-29 LAB — CBG MONITORING, ED: Glucose-Capillary: 95 mg/dL (ref 70–99)

## 2022-03-29 LAB — URINALYSIS, ROUTINE W REFLEX MICROSCOPIC
Bilirubin Urine: NEGATIVE
Glucose, UA: 50 mg/dL — AB
Hgb urine dipstick: NEGATIVE
Ketones, ur: NEGATIVE mg/dL
Leukocytes,Ua: NEGATIVE
Nitrite: NEGATIVE
Protein, ur: NEGATIVE mg/dL
Specific Gravity, Urine: 1.02 (ref 1.005–1.030)
pH: 6 (ref 5.0–8.0)

## 2022-03-29 LAB — D-DIMER, QUANTITATIVE: D-Dimer, Quant: 1.4 ug/mL-FEU — ABNORMAL HIGH (ref 0.00–0.50)

## 2022-03-29 LAB — TROPONIN I (HIGH SENSITIVITY): Troponin I (High Sensitivity): 11 ng/L (ref <18)

## 2022-03-29 MED ORDER — FOLIC ACID 1 MG PO TABS
1.0000 mg | ORAL_TABLET | Freq: Every day | ORAL | Status: DC
  Administered 2022-03-29: 1 mg via ORAL
  Filled 2022-03-29: qty 1

## 2022-03-29 MED ORDER — PANTOPRAZOLE SODIUM 40 MG IV SOLR
40.0000 mg | INTRAVENOUS | Status: DC
  Administered 2022-03-29: 40 mg via INTRAVENOUS
  Filled 2022-03-29: qty 10

## 2022-03-29 MED ORDER — ADULT MULTIVITAMIN W/MINERALS CH
1.0000 | ORAL_TABLET | Freq: Every day | ORAL | Status: DC
  Administered 2022-03-29: 1 via ORAL
  Filled 2022-03-29: qty 1

## 2022-03-29 MED ORDER — LORAZEPAM 1 MG PO TABS: 1.0000 mg | ORAL_TABLET | ORAL | Status: DC | PRN

## 2022-03-29 MED ORDER — IOHEXOL 350 MG/ML SOLN
75.0000 mL | Freq: Once | INTRAVENOUS | Status: AC | PRN
  Administered 2022-03-29: 75 mL via INTRAVENOUS

## 2022-03-29 MED ORDER — ORAL CARE MOUTH RINSE: 15.0000 mL | OROMUCOSAL | Status: DC | PRN

## 2022-03-29 MED ORDER — LORAZEPAM 2 MG/ML IJ SOLN: 4.0000 mg | INTRAMUSCULAR | Status: DC | PRN

## 2022-03-29 MED ORDER — LACTATED RINGERS IV SOLN: INTRAVENOUS | Status: DC

## 2022-03-29 MED ORDER — ONDANSETRON HCL 4 MG PO TABS: 4.0000 mg | ORAL_TABLET | Freq: Four times a day (QID) | ORAL | Status: DC | PRN

## 2022-03-29 MED ORDER — SODIUM CHLORIDE 0.9 % IV SOLN: 75.0000 mL/h | INTRAVENOUS | Status: DC

## 2022-03-29 MED ORDER — THIAMINE MONONITRATE 100 MG PO TABS
100.0000 mg | ORAL_TABLET | Freq: Every day | ORAL | Status: DC
  Administered 2022-03-29: 100 mg via ORAL
  Filled 2022-03-29: qty 1

## 2022-03-29 MED ORDER — THIAMINE HCL 100 MG/ML IJ SOLN: 100.0000 mg | Freq: Every day | INTRAMUSCULAR | Status: DC

## 2022-03-29 MED ORDER — ENOXAPARIN SODIUM 60 MG/0.6ML IJ SOSY
0.5000 mg/kg | PREFILLED_SYRINGE | INTRAMUSCULAR | Status: DC
  Administered 2022-03-29: 50 mg via SUBCUTANEOUS
  Filled 2022-03-29: qty 0.6

## 2022-03-29 MED ORDER — ORAL CARE MOUTH RINSE
15.0000 mL | OROMUCOSAL | Status: DC
  Filled 2022-03-29 (×6): qty 15

## 2022-03-29 MED ORDER — ACETAMINOPHEN 325 MG PO TABS: 650.0000 mg | ORAL_TABLET | ORAL | Status: DC | PRN

## 2022-03-29 MED ORDER — ONDANSETRON HCL 4 MG/2ML IJ SOLN: 4.0000 mg | Freq: Four times a day (QID) | INTRAMUSCULAR | Status: DC | PRN

## 2022-03-29 MED ORDER — ACETAMINOPHEN 325 MG RE SUPP: 650.0000 mg | RECTAL | Status: DC | PRN

## 2022-03-29 MED ORDER — SODIUM CHLORIDE 0.9% FLUSH: 3.0000 mL | Freq: Two times a day (BID) | INTRAVENOUS | Status: DC

## 2022-03-29 NOTE — ED Notes (Signed)
Pt assisted to the toilet and had a BM.

## 2022-03-29 NOTE — ED Notes (Signed)
Patient transported to MRI 

## 2022-03-29 NOTE — H&P (Addendum)
History and Physical    Patient: Danny Patterson OXB:[id number] DOB: 03-29-1945 DOA: 03/28/2022 DOS: the patient was seen and examined on 03/29/2022 PCP: Smitty Cords, DO  Patient coming from: Home  Chief Complaint:  Chief Complaint  Patient presents with   Syncopal Episode    HPI: Danny Patterson is a 77 y.o. male with medical history significant for Chronic back pain since lumbar laminectomy in 2017, BPH, who presents to the ED by EMS after witnessed syncopal episode.  History is given by daughter and wife at the bedside who witnessed the event because patient has little recollection of the event.  He was in his usual state of health and was having dinner when he complained of feeling unwell like he had too much to eat and lightheaded.  According to his wife, he pushed his chair back from the table and then slumped forward, became unresponsive and started drooling and then food contents started coming out of his mouth.  He was unable to respond to commands or answer to his name.  Daughter called 911 and they laid him on the floor as directed, by which time he started coming around but was very sluggish.  Wife states he was coughing a lot and was very diaphoretic and appeared to have urinated on himself.  By arrival of EMS BP was in the 60s over 40s and he was more responsive.  On my assessment patient was back to baseline and states the last thing he remembered was feeling lightheaded.  He also complained of pain in his neck though he did not fall.  He denied headache.  He was not recently ill and denied cough, fever or chills, shortness of breath, chest pain or palpitations.  He does endorse having acid reflux and intermittent tongue swelling which did not happen on the night of arrival.  He endorses drinking 4-6 beers daily which is usual for him. ED course and data review: BP on arrival 151/78, improved from 60/40 with fluid bolus administered by EMS.  Other vitals unremarkable.  Labs  unremarkable.  CBC with normal WBC and hemoglobin at baseline at 11.2.  CMP with creatinine at baseline at 1.41.  Troponin 11, lipase 44, AST 47 and ALT 27.   EKG, personally viewed and interpreted showing sinus at 64 with RBBB and no ischemic ST-T wave changes. MRI brain suggestive of cerebral amyloid angiopathy as follows: IMPRESSION: 1. No acute intracranial process. No evidence of acute or subacute infarct. 2. Multiple foci of hemosiderin deposition in the peripheral aspects of the cerebral hemispheres, with sparing of the thalami and basal ganglia, which are increased in number compared to the 2017 MRI. 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 3. Suspect hemosiderin deposition in the bilateral frontal sulci, likely sequela of prior subarachnoid hemorrhage.  Patient was treated with IV Benadryl due to him mentioning history of tongue swelling.  Hospitalist subsequently consulted for admission.   Review of Systems: As mentioned in the history of present illness. All other systems reviewed and are negative.  Past Medical History:  Diagnosis Date   Allergy    Arthritis    Gout    Hyperlipidemia    Hyperlipidemia    Hypertension    Kidney stone    Rheumatic fever    Past Surgical History:  Procedure Laterality Date   BACK SURGERY     knot     removed from neck   POSTERIOR LUMBAR FUSION 4 LEVEL N/A 10/01/2015   Procedure: Lumbar three-four,  Lumbar four-five,  Lumbar five-Sacrum one posterior lumbar interbody fusion with Laminotomy at Lumbar Two-Three;  Surgeon: Leeroy Cha, MD;  Location: [facility];  Service: Neurosurgery;  Laterality: N/A;   Social History:  reports that he has quit smoking. His smoking use included cigarettes. He has quit using smokeless tobacco. He reports that he does not currently use alcohol after a past usage of about 3.0 standard drinks of alcohol per week. He reports that he does not use drugs.  Allergies  Allergen Reactions   Ace  Inhibitors Other (See Comments)    Angioedema.   Angiotensin    Losartan Swelling    Family History  Problem Relation Age of Onset   Parkinson's disease Father 82   Kidney disease Maternal Grandmother    Hypertension Sister    Kidney cancer Neg Hx    Prostate cancer Neg Hx     Prior to Admission medications   Medication Sig Start Date End Date Taking? Authorizing Provider  acetaminophen (TYLENOL) 500 MG tablet Take 2 tablets (1,000 mg total) by mouth every 8 (eight) hours as needed. 11/12/16   Karamalegos, Devonne Doughty, DO  allopurinol (ZYLOPRIM) 100 MG tablet Take 100 mg by mouth daily.    [provider]  allopurinol (ZYLOPRIM) 300 MG tablet Take 1 tablet (300 mg total) by mouth daily. 01/15/22   Karamalegos, Devonne Doughty, DO  amLODipine (NORVASC) 5 MG tablet Take 1 tablet (5 mg total) by mouth daily. 10/14/21   Karamalegos, Devonne Doughty, DO  atorvastatin (LIPITOR) 20 MG tablet Take 1 tablet (20 mg total) by mouth daily at 6 PM. 10/14/21   Karamalegos, Devonne Doughty, DO  baclofen (LIORESAL) 10 MG tablet Take 0.5-1 tablets (5-10 mg total) by mouth 3 (three) times daily as needed for muscle spasms. 01/15/22   Karamalegos, Devonne Doughty, DO  cholecalciferol (VITAMIN D) 1000 units tablet Take 1,000 Units daily by mouth.    [provider]  diclofenac Sodium (VOLTAREN) 1 % GEL APPLY 2 G TOPICALLY 3 (THREE) TIMES DAILY. AS NEEDED FOR HANDS AND KNEE, ARTHRITIS. 11/14/20   Jearld Fenton, NP  furosemide (LASIX) 20 MG tablet TAKE 1-2 TABLETS (20-40 MG TOTAL) BY MOUTH DAILY AS NEEDED FOR EDEMA. 10/28/21   Karamalegos, Devonne Doughty, DO  HORSE CHESTNUT PO Take by mouth.    [provider]  magnesium gluconate (MAGONATE) 500 MG tablet Take 500 mg by mouth daily.    [provider]  potassium citrate (UROCIT-K) 10 MEQ (1080 MG) SR tablet Take 10 mEq by mouth 3 (three) times daily with meals.    [provider]  tamsulosin (FLOMAX) 0.4 MG CAPS capsule Take 1 capsule  (0.4 mg total) by mouth daily. 01/15/22   Olin Hauser, DO    Physical Exam: Vitals:   03/28/22 2330 03/29/22 0000 03/29/22 0030 03/29/22 0230  BP: (!) 148/79 (!) 153/73 (!) 147/69   Pulse: 67 (!) 105  88  Resp: 15 18 17 12   Temp:      TempSrc:      SpO2: 97% 94%  98%  Weight:      Height:       Physical Exam Vitals and nursing note reviewed.  Constitutional:      General: He is not in acute distress. HENT:     Head: Normocephalic and atraumatic.  Cardiovascular:     Rate and Rhythm: Normal rate and regular rhythm.     Heart sounds: Normal heart sounds.  Pulmonary:     Effort:  Pulmonary effort is normal.     Breath sounds: Normal breath sounds.  Abdominal:     Palpations: Abdomen is soft.     Tenderness: There is no abdominal tenderness.  Neurological:     Mental Status: Mental status is at baseline.     Labs on Admission: I have personally reviewed following labs and imaging studies  CBC: Recent Labs  Lab 03/28/22 2226  WBC 5.8  NEUTROABS 3.0  HGB 11.2*  HCT 33.6*  MCV 95.5  PLT 158   Basic Metabolic Panel: Recent Labs  Lab 03/28/22 2226  NA 136  K 3.8  CL 103  CO2 20*  GLUCOSE 100*  BUN 22  CREATININE 1.41*  CALCIUM 8.4*   GFR: Estimated Creatinine Clearance: 52.8 mL/min (A) (by C-G formula based on SCr of 1.41 mg/dL (H)). Liver Function Tests: Recent Labs  Lab 03/28/22 2226  AST 47*  ALT 27  ALKPHOS 69  BILITOT 1.0  PROT 5.9*  ALBUMIN 3.3*   Recent Labs  Lab 03/28/22 2226  LIPASE 44   No results for input(s): "AMMONIA" in the last 168 hours. Coagulation Profile: No results for input(s): "INR", "PROTIME" in the last 168 hours. Cardiac Enzymes: No results for input(s): "CKTOTAL", "CKMB", "CKMBINDEX", "TROPONINI" in the last 168 hours. BNP (last 3 results) No results for input(s): "PROBNP" in the last 8760 hours. HbA1C: No results for input(s): "HGBA1C" in the last 72 hours. CBG: No results for input(s): "GLUCAP" in  the last 168 hours. Lipid Profile: No results for input(s): "CHOL", "HDL", "LDLCALC", "TRIG", "CHOLHDL", "LDLDIRECT" in the last 72 hours. Thyroid Function Tests: No results for input(s): "TSH", "T4TOTAL", "FREET4", "T3FREE", "THYROIDAB" in the last 72 hours. Anemia Panel: No results for input(s): "VITAMINB12", "FOLATE", "FERRITIN", "TIBC", "IRON", "RETICCTPCT" in the last 72 hours. Urine analysis:    Component Value Date/Time   COLORURINE YELLOW 10/15/2015 2355   APPEARANCEUR CLEAR 10/15/2015 2355   LABSPEC 1.017 10/15/2015 2355   PHURINE 6.0 10/15/2015 2355   GLUCOSEU NEGATIVE 10/15/2015 2355   HGBUR NEGATIVE 10/15/2015 2355   BILIRUBINUR NEGATIVE 10/15/2015 2355   KETONESUR NEGATIVE 10/15/2015 2355   PROTEINUR NEGATIVE 10/15/2015 2355   NITRITE NEGATIVE 10/15/2015 2355   LEUKOCYTESUR NEGATIVE 10/15/2015 2355    Radiological Exams on Admission: MR BRAIN WO CONTRAST  Result Date: 03/29/2022 CLINICAL DATA:  Syncopal episode EXAM: MRI HEAD WITHOUT CONTRAST TECHNIQUE: Multiplanar, multiecho pulse sequences of the brain and surrounding structures were obtained without intravenous contrast. COMPARISON:  10/13/2015 MRI head, correlation is also made with 03/28/2012 CT head FINDINGS: Brain: No restricted diffusion to suggest acute or subacute infarct.No acute hemorrhage, mass, mass effect, or midline shift. No hydrocephalus or extra-axial collection.Normal pituitary and craniocervical junction. Medial right parietal encephalomalacia with hemosiderin deposition, which is new compared to the 2017 MRI, consistent with remote infarct with associated hemorrhage. This correlates with new hypodensities seen on the 03/28/2022 CT. Multiple foci of hemosiderin deposition in the peripheral aspects of the right greater than left and posterior greater than anterior cerebral hemispheres, with sparing of the thalami and basal ganglia, which are increased in number compared to 2017.Suspect hemosiderin deposition  in the bilateral frontal sulci (series 12, image 17 on the right and 18-20 on the left), likely sequela of prior subarachnoid hemorrhage. Advanced cerebral volume loss for age. Confluent T2 hyperintense signal in the periventricular white matter, likely the sequela of moderate chronic small vessel ischemic disease. Vascular: Patent arterial flow voids. Skull and upper cervical spine: Normal marrow signal. Sinuses/Orbits: Mucosal  thickening in the ethmoid air cells and right frontal sinus. No acute finding in the orbits. Other: None. IMPRESSION: 1. No acute intracranial process. No evidence of acute or subacute infarct. 2. Multiple foci of hemosiderin deposition in the peripheral aspects of the cerebral hemispheres, with sparing of the thalami and basal ganglia, which are increased in number compared to the 2017 MRI. 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 3. Suspect hemosiderin deposition in the bilateral frontal sulci, likely sequela of prior subarachnoid hemorrhage. Electronically Signed   By: Wiliam Ke M.D.   On: 03/29/2022 01:40   CT Head Wo Contrast  Addendum Date: 03/28/2022   ADDENDUM REPORT: 03/28/2022 23:39 ADDENDUM: These results were called by telephone at the time of interpretation on 03/28/2022 at 11:38 pm to provider PHILLIP STAFFORD , who verbally acknowledged these results. Electronically Signed   By: Tish Frederickson M.D.   On: 03/28/2022 23:39   Result Date: 03/28/2022 CLINICAL DATA:  Syncope/presyncope, cerebrovascular cause suspected EXAM: CT HEAD WITHOUT CONTRAST TECHNIQUE: Contiguous axial images were obtained from the base of the skull through the vertex without intravenous contrast. RADIATION DOSE REDUCTION: This exam was performed according to the departmental dose-optimization program which includes automated exposure control, adjustment of the mA and/or kV according to patient size and/or use of iterative reconstruction technique. COMPARISON:  MRI head 10/13/2015 FINDINGS:  Brain: Patchy and confluent areas of decreased attenuation are noted throughout the deep and periventricular white matter of the cerebral hemispheres bilaterally, compatible with chronic microvascular ischemic disease. Interval development (compared to 2017) of right parietal white matter hypodensity. No evidence of large-territorial acute infarction. No parenchymal hemorrhage. No mass lesion. No extra-axial collection. No mass effect or midline shift. No hydrocephalus. Basilar cisterns are patent. Vascular: No hyperdense vessel. Atherosclerotic calcifications are present within the cavernous internal carotid arteries. Skull: No acute fracture or focal lesion. Sinuses/Orbits: Paranasal sinuses and mastoid air cells are clear. The orbits are unremarkable. Other: None. IMPRESSION: 1. Interval development (compared to 2017) of an age-indeterminate, likely chronic, infarction. Underlying acute infarction not excluded. Consider MRI noncontrast for further evaluation. 2. No acute intracranial hemorrhage. Electronically Signed: By: Tish Frederickson M.D. On: 03/28/2022 23:34     Data Reviewed: Relevant notes from primary care and specialist visits, past discharge summaries as available in EHR, including Care Everywhere. 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  Lower suspicion for acute cardiac event Orthostatic hypotension possibility given SBP in the 60s with  EMS D-dimer to evaluate for possibility of PE Continuous cardiac monitoring, echocardiogram, carotid Doppler and EEG Troponin negative Orthostatic vitals We will keep n.p.o. tonight Fall aspiration and seizure precautions Neurology consult in the a.m.  Possible Aspiration of food Reports feeling like he had a few too much prior to the syncopal event, drooled and vomited and coughed after the event CXR ordered Will keep n.p.o. tonight and with aspiration precautions Monitor for development of respiratory symptoms Protonix SLP consult  Neck pain Chronic back pain s/p lumbar fusion Patient complained of neck pain but was not on meningitic on exam  and had no fall Can consider further imaging  Alcohol use disorder CIWA withdrawal protocol Counseled on cutting back  Stage 3a chronic kidney disease (HCC) Renal function at baseline  Chronic anemia At baseline        DVT prophylaxis: Lovenox  Consults: Neurology, Dr. Jerrell Belfast  Advance Care Planning:   Code Status: Prior   Family Communication: Wife and daughter at bedside  Disposition Plan: Back to previous home environment  Severity of Illness: The appropriate patient status for this patient is INPATIENT. Inpatient status is judged to be reasonable and necessary in order to provide the required intensity of service to ensure the patient's safety. The patient's presenting symptoms, physical exam findings, and initial radiographic and laboratory data in the context of their chronic comorbidities is felt to place them at high risk for further clinical deterioration. Furthermore, it is not anticipated that the patient will be medically stable for discharge from the hospital within 2 midnights of admission.   * I certify that at the point of admission it is my clinical judgment that the patient will require inpatient hospital care spanning beyond 2 midnights from the point of admission due to high intensity of service, high risk  for further deterioration and high frequency of surveillance required.*  Author: Andris Baumann, MD 03/29/2022 2:54 AM  For on call review [email or web address].

## 2022-03-29 NOTE — Assessment & Plan Note (Signed)
Chronic back pain s/p lumbar fusion Patient complained of neck pain but was not on meningitic on exam and had no fall Can consider further imaging

## 2022-03-29 NOTE — ED Provider Notes (Signed)
12:03 AM Assumed care for off going team.   Blood pressure (!) 148/79, pulse 67, temperature (!) 97.4 F (36.3 C), temperature source Oral, resp. rate 15, height 5\' 10"  (1.778 m), weight 99.8 kg, SpO2 97 %.  See their HPI for full report but in brief pending MRI.  Updated family on the CT scan and patient getting an MRI.  No contraindications to MRI.  Family is now at bedside who reports that he was slumped over for 5 to 10 minutes he had to lower him down to the ground and that he was unresponsive with drool coming out of him.  They deny this ever happening before.  He does report history of vertigo denies any significant dizziness at this time.  Cranial nerves are intact finger-nose intact bilaterally.  No evidence of LVO based upon examination.  We discussed different options including admission versus discharge and they would feel more comfortable with admission, cardiac monitoring, MRI.  Will discuss with hospital team for admission       Vanessa [place], MD 03/29/22 [phone]

## 2022-03-29 NOTE — ED Notes (Signed)
Dr. Duncan at bedside 

## 2022-03-29 NOTE — Assessment & Plan Note (Addendum)
Reports feeling like he had a few too much prior to the syncopal event, drooled and vomited and coughed after the event CXR ordered Will keep n.p.o. tonight and with aspiration precautions Monitor for development of respiratory symptoms Protonix SLP consult

## 2022-03-29 NOTE — Assessment & Plan Note (Signed)
Renal function at baseline 

## 2022-03-29 NOTE — Assessment & Plan Note (Addendum)
CIWA withdrawal protocol Counseled on cutting back

## 2022-03-29 NOTE — Progress Notes (Signed)
SLP Cancellation Note  Patient Details Name: Danny Patterson MRN: [id number] DOB: [birth year]   Cancelled treatment:       Reason Eval/Treat Not Completed: SLP screened, no needs identified, will sign off   SLP consult received and appreciated. Chart review completed. Pt passed [facility] Dysphagia Screening per chart review. Initiated PO diet. RN with no concerns re: swallowing.  Cherrie Gauze, M.S., [facility] [phone] ([place])  Quintella Baton 03/29/2022, 11:14 AM

## 2022-03-29 NOTE — Plan of Care (Signed)
Discussed with the hospitalist-presentation consistent with a vasovagal syncope.  Neurological consultation has been canceled.  -- Amie Portland, MD Neurologist [facility] Pager: [phone]

## 2022-03-29 NOTE — ED Notes (Signed)
Daughter Danny Patterson is heading home, phone number listed in emergency contacts.

## 2022-03-29 NOTE — Assessment & Plan Note (Addendum)
MRI brain concerning for cerebral amyloid angiopathy Uncertain etiology MRI negative for acute stroke but showing multiple foci of hemosiderin deposition but the blood concerning for cerebral amyloid angiopathy Differential includes seizure, acute aspiration.  Lower suspicion for acute cardiac event Orthostatic hypotension possibility given SBP in the 60s with EMS D-dimer to evaluate for possibility of PE Continuous cardiac monitoring, echocardiogram, carotid Doppler and EEG Troponin negative Orthostatic vitals We will keep n.p.o. tonight Fall aspiration and seizure precautions Neurology consult in the a.m.

## 2022-03-29 NOTE — Discharge Summary (Signed)
Danny Patterson GBT:[id number] DOB: 1945/06/20 DOA: 03/28/2022  PCP: Smitty Cords, DO  Admit date: 03/28/2022 Discharge date: 03/29/2022  Time spent: 35 minutes  Recommendations for Outpatient Follow-up:  Pcp f/u     Discharge Diagnoses:  Principal Problem:   Syncope, vasovagal Active Problems:   Abnormal finding on MRI of brain   Neck pain   Possible Aspiration of food   S/P lumbar fusion   Chronic back pain s/p lumbar fusion 2017   Type 2 diabetes mellitus (HCC)   Chronic anemia   OSA (obstructive sleep apnea)   Lewy body dementia (HCC)   BPH with obstruction/lower urinary tract symptoms   Obesity (BMI 30.0-34.9)   Stage 3a chronic kidney disease (HCC)   Alcohol use disorder   Essential hypertension   Discharge Condition: stable  Diet recommendation: heart healthy  Filed Weights   03/28/22 2223  Weight: 99.8 kg    History of present illness:  From admission h and p DAL BLEW is a 77 y.o. male with medical history significant for Chronic back pain since lumbar laminectomy in 2017, BPH, who presents to the ED by EMS after witnessed syncopal episode.  History is given by daughter and wife at the bedside who witnessed the event because patient has little recollection of the event.  He was in his usual state of health and was having dinner when he complained of feeling unwell like he had too much to eat and lightheaded.  According to his wife, he pushed his chair back from the table and then slumped forward, became unresponsive and started drooling and then food contents started coming out of his mouth.  He was unable to respond to commands or answer to his name.  Daughter called 911 and they laid him on the floor as directed, by which time he started coming around but was very sluggish.  Wife states he was coughing a lot and was very diaphoretic and appeared to have urinated on himself.  By arrival of EMS BP was in the 60s over 40s and he was more responsive.  On my  assessment patient was back to baseline and states the last thing he remembered was feeling lightheaded.  He also complained of pain in his neck though he did not fall.  He denied headache.  He was not recently ill and denied cough, fever or chills, shortness of breath, chest pain or palpitations.  He does endorse having acid reflux and intermittent tongue swelling which did not happen on the night of arrival.  He endorses drinking 4-6 beers daily which is usual for him.   Hospital Course:  Patient presents after an episode of syncope. He ate a large meal, then felt lightheaded, then syncopized. He returned to baseline by the time EMS arrived at the ED. Food did come out of his mouth when he syncopized but no respiratory symptoms and CT is negative for aspiration, PE, or other abnormality. EKG and cardiac monitoring unremarkable. Carotid dopplers no high grade stenosis. MRI with chronic micro-bleeds, discussed case with neurology (Dr. Elon Spanner) who advised no additional therapeutics. Orthostats negative, ambulation is at baseline.   Procedures: none   Consultations: none  Discharge Exam: Vitals:   03/29/22 0804 03/29/22 1015  BP: (!) 153/74 (!) 169/77  Pulse: 77 96  Resp: (!) 24 (!) 26  Temp: 98.1 F (36.7 C)   SpO2: 94% 100%    General: NAD Cardiovascular: RRR Respiratory: CTAB  Discharge Instructions   Discharge Instructions  Diet - low sodium heart healthy   Complete by: As directed    Increase activity slowly   Complete by: As directed       Allergies as of 03/29/2022       Reactions   Ace Inhibitors Other (See Comments)   Angioedema.   Angiotensin    Losartan Swelling        Medication List     TAKE these medications    acetaminophen 500 MG tablet Commonly known as: TYLENOL Take 2 tablets (1,000 mg total) by mouth every 8 (eight) hours as needed.   allopurinol 100 MG tablet Commonly known as: ZYLOPRIM Take 100 mg by mouth daily.   allopurinol 300 MG  tablet Commonly known as: ZYLOPRIM Take 1 tablet (300 mg total) by mouth daily.   amLODipine 5 MG tablet Commonly known as: NORVASC Take 1 tablet (5 mg total) by mouth daily.   atorvastatin 20 MG tablet Commonly known as: LIPITOR Take 1 tablet (20 mg total) by mouth daily at 6 PM.   baclofen 10 MG tablet Commonly known as: LIORESAL Take 0.5-1 tablets (5-10 mg total) by mouth 3 (three) times daily as needed for muscle spasms.   cholecalciferol 1000 units tablet Commonly known as: VITAMIN D Take 1,000 Units daily by mouth.   diclofenac Sodium 1 % Gel Commonly known as: VOLTAREN APPLY 2 G TOPICALLY 3 (THREE) TIMES DAILY. AS NEEDED FOR HANDS AND KNEE, ARTHRITIS.   furosemide 20 MG tablet Commonly known as: LASIX TAKE 1-2 TABLETS (20-40 MG TOTAL) BY MOUTH DAILY AS NEEDED FOR EDEMA.   HORSE CHESTNUT PO Take by mouth.   magnesium gluconate 500 MG tablet Commonly known as: MAGONATE Take 500 mg by mouth daily.   potassium citrate 10 MEQ (1080 MG) SR tablet Commonly known as: UROCIT-K Take 10 mEq by mouth 3 (three) times daily with meals.   tamsulosin 0.4 MG Caps capsule Commonly known as: FLOMAX Take 1 capsule (0.4 mg total) by mouth daily.       Allergies  Allergen Reactions   Ace Inhibitors Other (See Comments)    Angioedema.   Angiotensin    Losartan Swelling    Follow-up Information     Smitty Cords, DO Follow up.   Specialty: Family Medicine Contact information: [address] [place] [address] [phone]                  The results of significant diagnostics from this hospitalization (including imaging, microbiology, ancillary and laboratory) are listed below for reference.    Significant Diagnostic Studies: US Carotid Bilateral  Result Date: 03/29/2022 CLINICAL DATA:  Syncope, hypertension and hyperlipidemia. EXAM: BILATERAL CAROTID DUPLEX ULTRASOUND TECHNIQUE: Wallace Cullens scale imaging, color Doppler and duplex ultrasound were  performed of bilateral carotid and vertebral arteries in the neck. COMPARISON:  None Available. FINDINGS: Criteria: Quantification of carotid stenosis is based on velocity parameters that correlate the residual internal carotid diameter with NASCET-based stenosis levels, using the diameter of the distal internal carotid lumen as the denominator for stenosis measurement. The following velocity measurements were obtained: RIGHT ICA:  100/16 cm/sec CCA:  86/7 cm/sec SYSTOLIC ICA/CCA RATIO:  1.2 ECA:  88 cm/sec LEFT ICA:  118/22 cm/sec CCA:  104/11 cm/sec SYSTOLIC ICA/CCA RATIO:  1.1 ECA:  120 cm/sec RIGHT CAROTID ARTERY: Trace plaque at the level of the carotid bulb/right ICA origin. Estimated right ICA stenosis is less than 50%. RIGHT VERTEBRAL ARTERY: Antegrade flow with normal waveform and velocity. LEFT CAROTID ARTERY: Mild  plaque at the level of the left carotid bulb. No evidence of left ICA plaque or stenosis. LEFT VERTEBRAL ARTERY: Antegrade flow with normal waveform and velocity. IMPRESSION: Trace plaque at the level of the right carotid bulb/ICA origin and mild plaque at the level of the left carotid bulb. No evidence of bilateral ICA plaque or stenosis. Estimated right ICA stenosis is less than 50%. Electronically Signed   By: Aletta Edouard M.D.   On: 03/29/2022 10:09   CT Angio Chest Pulmonary Embolism (PE) W or WO Contrast  Result Date: 03/29/2022 CLINICAL DATA:  Syncope, simple with normal neuro exam. EXAM: CT ANGIOGRAPHY CHEST WITH CONTRAST TECHNIQUE: Multidetector CT imaging of the chest was performed using the standard protocol during bolus administration of intravenous contrast. Multiplanar CT image reconstructions and MIPs were obtained to evaluate the vascular anatomy. RADIATION DOSE REDUCTION: This exam was performed according to the departmental dose-optimization program which includes automated exposure control, adjustment of the mA and/or kV according to patient size and/or use of iterative  reconstruction technique. CONTRAST:  17mL OMNIPAQUE IOHEXOL 350 MG/ML SOLN COMPARISON:  10/05/2015 FINDINGS: Cardiovascular: Satisfactory opacification of the pulmonary arteries to the segmental level. No evidence of pulmonary embolism. Normal heart size. No pericardial effusion. Atheromatous calcification of the aorta and coronaries. Mediastinum/Nodes: Negative for adenopathy or mass. Lungs/Pleura: There is no edema, consolidation, effusion, or pneumothorax. Upper Abdomen: Negative Musculoskeletal: Extensive bridging thoracic osteophytes with accentuated degenerative vacuum phenomenon and spurring at the open levels. Presumed dermal inclusion cyst in the subcutaneous right lateral back. Review of the MIP images confirms the above findings. IMPRESSION: 1. Negative for pulmonary embolism or other acute finding. 2. Atherosclerosis including the coronary arteries. Electronically Signed   By: Jorje Guild M.D.   On: 03/29/2022 05:17   Portable Chest 1 View  Result Date: 03/29/2022 CLINICAL DATA:  Cough and possible syncopal episode EXAM: PORTABLE CHEST 1 VIEW COMPARISON:  10/13/2015 FINDINGS: Stable cardiomegaly. Bibasilar atelectasis/scarring. No focal pneumonia, pleural effusion, or pneumothorax. IMPRESSION: No active disease. Electronically Signed   By: Placido Sou M.D.   On: 03/29/2022 03:27   MR BRAIN WO CONTRAST  Result Date: 03/29/2022 CLINICAL DATA:  Syncopal episode EXAM: MRI HEAD WITHOUT CONTRAST TECHNIQUE: Multiplanar, multiecho pulse sequences of the brain and surrounding structures were obtained without intravenous contrast. COMPARISON:  10/13/2015 MRI head, correlation is also made with 03/28/2012 CT head FINDINGS: Brain: No restricted diffusion to suggest acute or subacute infarct.No acute hemorrhage, mass, mass effect, or midline shift. No hydrocephalus or extra-axial collection.Normal pituitary and craniocervical junction. Medial right parietal encephalomalacia with hemosiderin deposition,  which is new compared to the 2017 MRI, consistent with remote infarct with associated hemorrhage. This correlates with new hypodensities seen on the 03/28/2022 CT. Multiple foci of hemosiderin deposition in the peripheral aspects of the right greater than left and posterior greater than anterior cerebral hemispheres, with sparing of the thalami and basal ganglia, which are increased in number compared to 2017.Suspect hemosiderin deposition in the bilateral frontal sulci (series 12, image 17 on the right and 18-20 on the left), likely sequela of prior subarachnoid hemorrhage. Advanced cerebral volume loss for age. Confluent T2 hyperintense signal in the periventricular white matter, likely the sequela of moderate chronic small vessel ischemic disease. Vascular: Patent arterial flow voids. Skull and upper cervical spine: Normal marrow signal. Sinuses/Orbits: Mucosal thickening in the ethmoid air cells and right frontal sinus. No acute finding in the orbits. Other: None. IMPRESSION: 1. No acute intracranial process. No evidence of acute or  subacute infarct. 2. Multiple foci of hemosiderin deposition in the peripheral aspects of the cerebral hemispheres, with sparing of the thalami and basal ganglia, which are increased in number compared to the 2017 MRI. 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 3. Suspect hemosiderin deposition in the bilateral frontal sulci, likely sequela of prior subarachnoid hemorrhage. Electronically Signed   By: Wiliam Ke M.D.   On: 03/29/2022 01:40   CT Head Wo Contrast  Addendum Date: 03/28/2022   ADDENDUM REPORT: 03/28/2022 23:39 ADDENDUM: These results were called by telephone at the time of interpretation on 03/28/2022 at 11:38 pm to provider PHILLIP STAFFORD , who verbally acknowledged these results. Electronically Signed   By: Tish Frederickson M.D.   On: 03/28/2022 23:39   Result Date: 03/28/2022 CLINICAL DATA:  Syncope/presyncope, cerebrovascular cause suspected EXAM: CT  HEAD WITHOUT CONTRAST TECHNIQUE: Contiguous axial images were obtained from the base of the skull through the vertex without intravenous contrast. RADIATION DOSE REDUCTION: This exam was performed according to the departmental dose-optimization program which includes automated exposure control, adjustment of the mA and/or kV according to patient size and/or use of iterative reconstruction technique. COMPARISON:  MRI head 10/13/2015 FINDINGS: Brain: Patchy and confluent areas of decreased attenuation are noted throughout the deep and periventricular white matter of the cerebral hemispheres bilaterally, compatible with chronic microvascular ischemic disease. Interval development (compared to 2017) of right parietal white matter hypodensity. No evidence of large-territorial acute infarction. No parenchymal hemorrhage. No mass lesion. No extra-axial collection. No mass effect or midline shift. No hydrocephalus. Basilar cisterns are patent. Vascular: No hyperdense vessel. Atherosclerotic calcifications are present within the cavernous internal carotid arteries. Skull: No acute fracture or focal lesion. Sinuses/Orbits: Paranasal sinuses and mastoid air cells are clear. The orbits are unremarkable. Other: None. IMPRESSION: 1. Interval development (compared to 2017) of an age-indeterminate, likely chronic, infarction. Underlying acute infarction not excluded. Consider MRI noncontrast for further evaluation. 2. No acute intracranial hemorrhage. Electronically Signed: By: Tish Frederickson M.D. On: 03/28/2022 23:34    Microbiology: No results found for this or any previous visit (from the past 240 hour(s)).   Labs: Basic Metabolic Panel: Recent Labs  Lab 03/28/22 2226  NA 136  K 3.8  CL 103  CO2 20*  GLUCOSE 100*  BUN 22  CREATININE 1.41*  CALCIUM 8.4*   Liver Function Tests: Recent Labs  Lab 03/28/22 2226  AST 47*  ALT 27  ALKPHOS 69  BILITOT 1.0  PROT 5.9*  ALBUMIN 3.3*   Recent Labs  Lab  03/28/22 2226  LIPASE 44   No results for input(s): "AMMONIA" in the last 168 hours. CBC: Recent Labs  Lab 03/28/22 2226  WBC 5.8  NEUTROABS 3.0  HGB 11.2*  HCT 33.6*  MCV 95.5  PLT 158   Cardiac Enzymes: No results for input(s): "CKTOTAL", "CKMB", "CKMBINDEX", "TROPONINI" in the last 168 hours. BNP: BNP (last 3 results) No results for input(s): "BNP" in the last 8760 hours.  ProBNP (last 3 results) No results for input(s): "PROBNP" in the last 8760 hours.  CBG: Recent Labs  Lab 03/29/22 0802  GLUCAP 95       Signed:  Silvano Bilis MD.  [facility] 03/29/2022, 10:32 AM

## 2022-03-29 NOTE — ED Notes (Signed)
Patient transported to CT 

## 2022-03-29 NOTE — Assessment & Plan Note (Signed)
At baseline 

## 2022-03-29 NOTE — Progress Notes (Signed)
Anticoagulation monitoring(Lovenox):  77 yo male ordered Lovenox 40 mg Q24h    Filed Weights   03/28/22 2223  Weight: 99.8 kg (220 lb)   BMI 31.6   Lab Results  Component Value Date   CREATININE 1.41 (H) 03/28/2022   CREATININE 1.41 (H) 10/14/2021   CREATININE 1.14 06/23/2020   Estimated Creatinine Clearance: 52.8 mL/min (A) (by C-G formula based on SCr of 1.41 mg/dL (H)). Hemoglobin & Hematocrit     Component Value Date/Time   HGB 11.2 (L) 03/28/2022 2226   HGB 12.7 02/26/2015 1404   HCT 33.6 (L) 03/28/2022 2226   HCT 36.5 (L) 02/26/2015 1404     Per Protocol for Patient with estCrcl > 30 ml/min and BMI > 30, will transition to Lovenox 50 mg Q24h.

## 2022-03-29 NOTE — ED Notes (Signed)
Patient transported back from CT 

## 2022-04-09 ENCOUNTER — Other Ambulatory Visit: Payer: Self-pay

## 2022-04-09 DIAGNOSIS — I129 Hypertensive chronic kidney disease with stage 1 through stage 4 chronic kidney disease, or unspecified chronic kidney disease: Secondary | ICD-10-CM

## 2022-04-09 DIAGNOSIS — E538 Deficiency of other specified B group vitamins: Secondary | ICD-10-CM

## 2022-04-09 DIAGNOSIS — M1A0790 Idiopathic chronic gout, unspecified ankle and foot, without tophus (tophi): Secondary | ICD-10-CM

## 2022-04-09 DIAGNOSIS — E790 Hyperuricemia without signs of inflammatory arthritis and tophaceous disease: Secondary | ICD-10-CM

## 2022-04-12 ENCOUNTER — Other Ambulatory Visit: Payer: PPO

## 2022-04-12 DIAGNOSIS — N182 Chronic kidney disease, stage 2 (mild): Secondary | ICD-10-CM | POA: Diagnosis not present

## 2022-04-12 DIAGNOSIS — I129 Hypertensive chronic kidney disease with stage 1 through stage 4 chronic kidney disease, or unspecified chronic kidney disease: Secondary | ICD-10-CM | POA: Diagnosis not present

## 2022-04-12 DIAGNOSIS — M1A0790 Idiopathic chronic gout, unspecified ankle and foot, without tophus (tophi): Secondary | ICD-10-CM | POA: Diagnosis not present

## 2022-04-12 DIAGNOSIS — E790 Hyperuricemia without signs of inflammatory arthritis and tophaceous disease: Secondary | ICD-10-CM | POA: Diagnosis not present

## 2022-04-12 DIAGNOSIS — E538 Deficiency of other specified B group vitamins: Secondary | ICD-10-CM | POA: Diagnosis not present

## 2022-04-13 LAB — COMPLETE METABOLIC PANEL WITH GFR
AG Ratio: 1.7 (calc) (ref 1.0–2.5)
ALT: 26 U/L (ref 9–46)
AST: 35 U/L (ref 10–35)
Albumin: 4.1 g/dL (ref 3.6–5.1)
Alkaline phosphatase (APISO): 82 U/L (ref 35–144)
BUN/Creatinine Ratio: 14 (calc) (ref 6–22)
BUN: 23 mg/dL (ref 7–25)
CO2: 29 mmol/L (ref 20–32)
Calcium: 9.8 mg/dL (ref 8.6–10.3)
Chloride: 105 mmol/L (ref 98–110)
Creat: 1.6 mg/dL — ABNORMAL HIGH (ref 0.70–1.28)
Globulin: 2.4 g/dL (calc) (ref 1.9–3.7)
Glucose, Bld: 83 mg/dL (ref 65–99)
Potassium: 4.3 mmol/L (ref 3.5–5.3)
Sodium: 142 mmol/L (ref 135–146)
Total Bilirubin: 0.6 mg/dL (ref 0.2–1.2)
Total Protein: 6.5 g/dL (ref 6.1–8.1)
eGFR: 44 mL/min/{1.73_m2} — ABNORMAL LOW (ref >=60)

## 2022-04-13 LAB — VITAMIN B12: Vitamin B-12: 587 pg/mL (ref 200–1100)

## 2022-04-13 LAB — URIC ACID: Uric Acid, Serum: 6.1 mg/dL (ref 4.0–8.0)

## 2022-04-16 ENCOUNTER — Ambulatory Visit: Payer: PPO

## 2022-04-19 ENCOUNTER — Ambulatory Visit
Admission: RE | Admit: 2022-04-19 | Discharge: 2022-04-19 | Disposition: A | Discharge status: Home / Self Care | Payer: PPO | Attending: Family Medicine | Admitting: Family Medicine

## 2022-04-19 ENCOUNTER — Ambulatory Visit
Admission: RE | Admit: 2022-04-19 | Discharge: 2022-04-19 | Disposition: A | Discharge status: Home / Self Care | Payer: PPO | Source: Ambulatory Visit | Attending: Family Medicine | Admitting: Family Medicine

## 2022-04-19 ENCOUNTER — Ambulatory Visit (INDEPENDENT_AMBULATORY_CARE_PROVIDER_SITE_OTHER): Payer: PPO | Admitting: Family Medicine

## 2022-04-19 ENCOUNTER — Ambulatory Visit: Payer: PPO | Admitting: Family Medicine

## 2022-04-19 VITALS — BP 128/64 | HR 79 | Ht 70.0 in | Wt 230.0 lb

## 2022-04-19 DIAGNOSIS — M159 Polyosteoarthritis, unspecified: Secondary | ICD-10-CM

## 2022-04-19 DIAGNOSIS — G6281 Critical illness polyneuropathy: Secondary | ICD-10-CM

## 2022-04-19 DIAGNOSIS — N138 Other obstructive and reflux uropathy: Secondary | ICD-10-CM | POA: Diagnosis not present

## 2022-04-19 DIAGNOSIS — N183 Chronic kidney disease, stage 3 unspecified: Secondary | ICD-10-CM

## 2022-04-19 DIAGNOSIS — G4733 Obstructive sleep apnea (adult) (pediatric): Secondary | ICD-10-CM | POA: Diagnosis not present

## 2022-04-19 DIAGNOSIS — M545 Low back pain, unspecified: Secondary | ICD-10-CM | POA: Diagnosis not present

## 2022-04-19 DIAGNOSIS — M5136 Other intervertebral disc degeneration, lumbar region: Secondary | ICD-10-CM | POA: Insufficient documentation

## 2022-04-19 DIAGNOSIS — E785 Hyperlipidemia, unspecified: Secondary | ICD-10-CM | POA: Diagnosis not present

## 2022-04-19 DIAGNOSIS — E1169 Type 2 diabetes mellitus with other specified complication: Secondary | ICD-10-CM

## 2022-04-19 DIAGNOSIS — I129 Hypertensive chronic kidney disease with stage 1 through stage 4 chronic kidney disease, or unspecified chronic kidney disease: Secondary | ICD-10-CM

## 2022-04-19 DIAGNOSIS — N200 Calculus of kidney: Secondary | ICD-10-CM

## 2022-04-19 DIAGNOSIS — N401 Enlarged prostate with lower urinary tract symptoms: Secondary | ICD-10-CM | POA: Diagnosis not present

## 2022-04-19 NOTE — Assessment & Plan Note (Signed)
Chronic problem since 2017 Failed gabapentin Tried Alpha lipoic acid limited results Failed Lyrica Off med

## 2022-04-19 NOTE — Assessment & Plan Note (Signed)
Stable chronic OA/DJD multiple joints Has Baclofen AS NEEDED Limited relief topical voltaren and other Today counseling on avoiding NSAIDs and increasing Tylenol dosing up to x 2 pills 2-3 times per day on Tylenol for arthritis joint pain

## 2022-04-19 NOTE — Progress Notes (Signed)
Subjective:    Patient ID: MALE MINISH, male    DOB: September 14, 1945, 77 y.o.   MRN: [id number]  Danny Patterson is a 77 y.o. 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 Not interested in other procedural intervention.   Last visit started Lyrica course, low dose Failed Lyrica, caused muscle jerking Muscle stiffness, needs to re try Baclofen again Gout still has episodic flares and concern with uric acid kidney stones, he restarted potassium and improved - needs higher dose Allopurinol previously 100mg  daily, switch to 300mg  colchicine causes diarrhea side effect   BPH Nocturia, urinary frequency He can have some good urinary flow at times.  Previously with nephrolithiasis he was on K Citrate from Urologist, but stopped this didn't follow up He continues K Citrate now Last visit 12/2021 he was started on Tamsulosin 0.4mg  daily for BPH and urination. Failed due to hypotension episode, now off Tamsulosin  CHRONIC HTN / CKD-III Recent lab. Creatinine 1.60 (03/2022) previously 1.4 range.  BP improved overall But he had episode of syncope and low BP and had hospitalization 1/7 to 1/8, was not seen here for HFU He does take Lasix for fluid PRN none recently Off amlodipine currently now elevated BP Denies CP, dyspnea, HA, edema, dizziness /  lightheadedness  OSA, on CPAP - Patient reports prior history of dx OSA and on CPAP - Today reports that sleep apnea is well controlled. He uses the CPAP machine every night. Tolerates the machine well, and thinks that sleeps better with it and feels good. No new concerns or symptoms.    CHRONIC DM, Type 2 / nephropathy Prior controlled, due for lab. A1c 4-5 range He checks CBG once in a while. Denies hypoglycemia, polyuria, visual changes, numbness or tingling.   HYPERLIPIDEMIA: - Currently taking Atorvastatin 20mg  daily, tolerating well without side effects or myalgias  due for repeat.   Additional complaint   Chronic Knee Pain Osteoarthritis / History of Chronic Gout Known history of recurrent knee pain. Previous gout flares episodic, seems improved. Today he reports recent worsening L knee pain and stiffness. He has known arthritis. In past using Diclofenac topical PRN some relief. He is interested in steroid injection. - Previously has seen Ortho / Rheumatology - he is also taking Colchicine 0.6mg  daily for prevention   Interval update, since last visit he has been on Allopurinol 300mg  daily instead of 100mg  Uric Acid 6.1 (03/2022) previously 8.2 (09/2021)  X-rays today Lumbar spine + KUB for stones refer to Urology [facility] different doctor, instead of Dr Bernardo Heater For kidney stones uric acid + BPH On Urocit       10/14/2021    3:56 PM 12/26/2019    1:47 PM 06/26/2019    2:10 PM  Depression screen PHQ 2/9  Decreased Interest 0 0 0  Down, Depressed, Hopeless 0 0 0  PHQ -  2 Score 0 0 0    Social History   Tobacco Use   Smoking status: Former    Types: Cigarettes   Smokeless tobacco: Former  Substance Use Topics   Alcohol use: Not Currently    Alcohol/week: 3.0 standard drinks of alcohol    Types: 3 Cans of beer per week    Comment: 05/04/16 3 beers daily   Drug use: No    Review of Systems Per HPI unless specifically indicated above     Objective:    BP 128/64    Pulse 79   Ht 5\' 10"  (1.778 m)   Wt 230 lb (104.3 kg)   SpO2 96%   BMI 33.00 kg/m   Wt Readings from Last 3 Encounters:  04/19/22 230 lb (104.3 kg)  03/28/22 220 lb (99.8 kg)  01/15/22 223 lb (101.2 kg)    Physical Exam Vitals and nursing note reviewed.  Constitutional:      General: He is not in acute distress.    Appearance: Normal appearance. He is well-developed. He is obese. He is not diaphoretic.     Comments: Well-appearing, comfortable, cooperative  HENT:     Head: Normocephalic and atraumatic.  Eyes:     General:        Right eye: No discharge.        Left eye: No discharge.     Conjunctiva/sclera: Conjunctivae normal.  Cardiovascular:     Rate and Rhythm: Normal rate.  Pulmonary:     Effort: Pulmonary effort is normal.  Musculoskeletal:     Comments: Antalgic gait and has persistent thoracic kyphosis curvature to his posture.  Skin:    General: Skin is warm and dry.     Findings: No erythema or rash.  Neurological:     Mental Status: He is alert and oriented to person, place, and time.  Psychiatric:        Mood and Affect: Mood normal.        Behavior: Behavior normal.        Thought Content: Thought content normal.     Comments: Well groomed, good eye contact, normal speech and thoughts     Recent Labs    10/14/21 1618  HGBA1C 4.8     Results for orders placed or performed in visit on 04/09/22  COMPLETE METABOLIC PANEL WITH GFR  Result Value Ref Range   Glucose, Bld 83 65 - 99 mg/dL   BUN 23 7 - 25 mg/dL   Creat 04/11/22 (H) 2.35 - 1.28 mg/dL   eGFR 44 (L) > OR = 60 mL/min/1.45m2   BUN/Creatinine Ratio 14 6 - 22 (calc)   Sodium 142 135 - 146 mmol/L   Potassium 4.3 3.5 - 5.3 mmol/L   Chloride 105 98 - 110 mmol/L   CO2 29 20 - 32 mmol/L   Calcium 9.8 8.6 - 10.3 mg/dL   Total Protein 6.5 6.1 - 8.1 g/dL   Albumin 4.1 3.6 - 5.1 g/dL   Globulin 2.4 1.9 - 3.7 g/dL (calc)   AG Ratio 1.7 1.0 - 2.5 (calc)   Total Bilirubin 0.6 0.2 - 1.2 mg/dL    Alkaline phosphatase (APISO) 82 35 - 144 U/L   AST 35 10 - 35 U/L   ALT 26 9 - 46 U/L  Uric acid  Result Value Ref Range   Uric Acid, Serum 6.1 4.0 - 8.0 mg/dL  Vitamin 75m  Result Value Ref Range   Vitamin B-12 587 200 - 1,100 pg/mL  Assessment & Plan:   Problem List Items Addressed This Visit     Benign hypertension with CKD (chronic kidney disease) stage III (HCC) - Primary    BP controlled Complication CKD-III, elevated Creatinine  Plan:  1. Continue current BP regimen - Amlodipine 5mg  daily, taking Lasix PRN 2. Encourage improved lifestyle - low sodium diet, regular exercise 3. Continue monitor BP outside office, bring readings to next visit, if persistently >140/90 or new symptoms notify office sooner      BPH with obstruction/lower urinary tract symptoms    Chronic BPH, mild improved seems stable No alpha blocker PSA stable - No known personal/family history of prostate CA  Plan: DISCONTINUE Tamsulosin 0.4mg  due to side effect hypotension. Referral back to Urology, consider other therapy options.      Critical illness neuropathy (HCC)    Chronic problem since 2017 Failed gabapentin Tried Alpha lipoic acid limited results Failed Lyrica Off med      Degenerative disc disease, lumbar    Chronic OA/DJD DDD Lumbar Chronic pain. Repeat imaging today Back X-ray On Baclofen Advised inc Tylenol dosing.      Relevant Medications   colchicine 0.6 MG tablet   Other Relevant Orders   DG Lumbar Spine Complete   Hyperlipidemia associated with type 2 diabetes mellitus (HCC)   OSA (obstructive sleep apnea)    Well controlled, chronic OSA on CPAP - Good adherence to CPAP nightly - Continue current CPAP therapy, patient seems to be benefiting from therapy       Osteoarthritis of multiple joints    Stable chronic OA/DJD multiple joints Has Baclofen AS NEEDED Limited relief topical voltaren and other Today counseling on avoiding NSAIDs and increasing Tylenol  dosing up to x 2 pills 2-3 times per day on Tylenol for arthritis joint pain      Relevant Medications   colchicine 0.6 MG tablet   Type 2 diabetes mellitus with other specified complication (HCC)    Well-controlled DM with A1c 4 range previously Complications - CKD-III, peripheral neuropathy (not necessarily 2/2 DM - with critical illness neuropathy)  Plan:  1. Not on therapy - repeat A1c at next lab visit 2. Encourage improved lifestyle - low carb, low sugar diet, reduce portion size, start regular exercise 3. On Statin       Uric acid nephrolithiasis    Followed by Urology previously, will need new re order. On Potassium citrate On Allopurinol  Re order for Urology BUA for Uric acid nephrolithiasis  KUB imaging today      Relevant Medications   colchicine 0.6 MG tablet   Other Relevant Orders   DG Abd 1 View   Ambulatory referral to Urology     Orders Placed This Encounter  Procedures   DG Lumbar Spine Complete    Standing Status:   Future    Number of Occurrences:   1    Standing Expiration Date:   04/20/2023    Order Specific Question:   Reason for Exam (SYMPTOM  OR DIAGNOSIS REQUIRED)    Answer:   chronic low back pain, lumbar DDD, recent worse 1 month    Order Specific Question:   Preferred imaging location?    Answer:   [facility] 04/22/2023   DG Abd 1 View    Standing Status:   Future    Number of Occurrences:   1    Standing Expiration Date:   07/19/2022    Order Specific Question:   Reason for Exam (SYMPTOM  OR DIAGNOSIS  REQUIRED)    Answer:   evaluation of kidney stones    Order Specific Question:   Preferred imaging location?    Answer:   [facility]   Ambulatory referral to Urology    Referral Priority:   Routine    Referral Type:   Consultation    Referral Reason:   Specialty Services Required    Requested Specialty:   Urology    Number of Visits Requested:   1     No orders of the defined types were placed in this encounter.     Follow up  plan: Return in about 3 months (around 07/19/2022) for 3 month DM A1c, Uro/specialist updates, arthritis gout edema.   [name], [facility] 04/19/2022, 11:13 AM

## 2022-04-19 NOTE — Assessment & Plan Note (Signed)
Chronic BPH, mild improved seems stable No alpha blocker PSA stable - No known personal/family history of prostate CA  Plan: DISCONTINUE Tamsulosin 0.4mg  due to side effect hypotension. Referral back to Urology, consider other therapy options.

## 2022-04-19 NOTE — Assessment & Plan Note (Signed)
Well controlled, chronic OSA on CPAP - Good adherence to CPAP nightly - Continue current CPAP therapy, patient seems to be benefiting from therapy  

## 2022-04-19 NOTE — Assessment & Plan Note (Signed)
Chronic OA/DJD DDD Lumbar Chronic pain. Repeat imaging today Back X-ray On Baclofen Advised inc Tylenol dosing.

## 2022-04-19 NOTE — Patient Instructions (Addendum)
Thank you for coming to the office today.  X-rays today low back and check for kidney stones.  STOP taking Tamsulosin 0.4mg , for prostate / urination. This MAY have lowered your blood pressure and caused you to pass out.  I will refer you back to the Urologist office but a different doctor for 2nd opinion  [facility] -[address],  [place]  [address] Phone: [phone]  They can discuss the kidney stones and urination. May find a new medication.  For the knee and back  INCREASE Tylenol to 2 to 3 times a day. Take TWO pills 500mg  or 650mg  x 2 = per dose 2 to 3 times a day for knee and back pain.  Recommend to start taking Tylenol Extra Strength 500mg  tabs - take 1 to 2 tabs per dose (max 1000mg ) every 6-8 hours for pain (take regularly, don't skip a dose for next 7 days), max 24 hour daily dose is 6 tablets or 3000mg . In the future you can repeat the same everyday Tylenol course for 1-2 weeks at a time.   Do NOT take Ibuprofen, Advil, Naproxen, Aleve   If you do not have leg swelling - you can SKIP Furosemide (Lasix) half to whole pill daily.   Please schedule a Follow-up Appointment to: Return in about 3 months (around 07/19/2022) for 3 month DM A1c, Uro/specialist updates, arthritis gout edema.  If you have any other questions or concerns, please feel free to call the office or send a message through [place]. You may also schedule an earlier appointment if necessary.  Additionally, you may be receiving a survey about your experience at our office within a few days to 1 week by e-mail or mail. We value your feedback.  [name], DO [facility]

## 2022-04-19 NOTE — Assessment & Plan Note (Signed)
Well-controlled DM with A1c 4 range previously Complications - CKD-III, peripheral neuropathy (not necessarily 2/2 DM - with critical illness neuropathy)  Plan:  1. Not on therapy - repeat A1c at next lab visit 2. Encourage improved lifestyle - low carb, low sugar diet, reduce portion size, start regular exercise 3. On Statin

## 2022-04-19 NOTE — Assessment & Plan Note (Signed)
Followed by Urology previously, will need new re order. On Potassium citrate On Allopurinol  Re order for Urology BUA for Uric acid nephrolithiasis  KUB imaging today

## 2022-04-19 NOTE — Assessment & Plan Note (Signed)
BP controlled Complication CKD-III, elevated Creatinine  Plan:  1. Continue current BP regimen - Amlodipine 5mg  daily, taking Lasix PRN 2. Encourage improved lifestyle - low sodium diet, regular exercise 3. Continue monitor BP outside office, bring readings to next visit, if persistently >140/90 or new symptoms notify office sooner

## 2022-05-04 ENCOUNTER — Ambulatory Visit: Payer: PPO | Admitting: Urology

## 2022-05-19 ENCOUNTER — Ambulatory Visit (INDEPENDENT_AMBULATORY_CARE_PROVIDER_SITE_OTHER): Payer: PPO | Admitting: Urology

## 2022-05-19 ENCOUNTER — Encounter: Payer: Self-pay | Admitting: Urology

## 2022-05-19 VITALS — BP 139/85 | HR 78 | Ht 70.0 in | Wt 222.0 lb

## 2022-05-19 DIAGNOSIS — N200 Calculus of kidney: Secondary | ICD-10-CM | POA: Diagnosis not present

## 2022-05-19 DIAGNOSIS — R399 Unspecified symptoms and signs involving the genitourinary system: Secondary | ICD-10-CM | POA: Diagnosis not present

## 2022-05-19 DIAGNOSIS — M549 Dorsalgia, unspecified: Secondary | ICD-10-CM

## 2022-05-19 DIAGNOSIS — R1031 Right lower quadrant pain: Secondary | ICD-10-CM

## 2022-05-19 LAB — URINALYSIS, COMPLETE
Bilirubin, UA: NEGATIVE
Glucose, UA: NEGATIVE
Leukocytes,UA: NEGATIVE
Nitrite, UA: NEGATIVE
RBC, UA: NEGATIVE
Specific Gravity, UA: 1.02 (ref 1.005–1.030)
Urobilinogen, Ur: 0.2 mg/dL (ref 0.2–1.0)
pH, UA: 5 (ref 5.0–7.5)

## 2022-05-19 LAB — MICROSCOPIC EXAMINATION

## 2022-05-19 NOTE — Progress Notes (Signed)
05/19/22 11:49 AM   Danny Patterson 12/20/1945 [id number]  CC: Nephrolithiasis, low back pain, lower urinary tract symptoms  HPI: I saw Mr. Danny Patterson and his daughter today for the above issue.  She provides most of the history.  He reportedly has a history of uric acid stones, recently has developed some worsening right > left low back pain, and KUB with PCP showed small nonobstructing bilateral renal stones and he was referred to urology.  He has been on potassium citrate long-term for stone prevention, and was previously followed by Dr. Bernardo Heater.  He denies any gross hematuria.  Urinalysis today is benign.  PSA normal at 0.76 in July 2023.  He also reports occasional problems with weak urinary stream and urinary dribbling.  He was trialed on Flomax by PCP but had severe orthostatic hypotension with a syncopal episode and discontinued that medication.  He seems to be only minimally bothered by the urinary symptoms.   PMH: Past Medical History:  Diagnosis Date   Allergy    Arthritis    Gout    Hyperlipidemia    Hyperlipidemia    Hypertension    Kidney stone    Rheumatic fever     Surgical History: Past Surgical History:  Procedure Laterality Date   BACK SURGERY     knot     removed from neck   POSTERIOR LUMBAR FUSION 4 LEVEL N/A 10/01/2015   Procedure: Lumbar three-four,  Lumbar four-five,  Lumbar five-Sacrum one posterior lumbar interbody fusion with Laminotomy at Lumbar Two-Three;  Surgeon: Leeroy Cha, MD;  Location: [facility];  Service: Neurosurgery;  Laterality: N/A;     Family History: Family History  Problem Relation Age of Onset   Parkinson's disease Father 74   Kidney disease Maternal Grandmother    Hypertension Sister    Kidney cancer Neg Hx    Prostate cancer Neg Hx     Social History:  reports that he has quit smoking. His smoking use included cigarettes. He has quit using smokeless tobacco. He reports that he does not currently use alcohol after a past  usage of about 3.0 standard drinks of alcohol per week. He reports that he does not use drugs.  Physical Exam: BP 139/85 (BP Location: Left Arm, Patient Position: Sitting, Cuff Size: Large)   Pulse 78   Ht '5\' 10"'$  (1.778 m)   Wt 222 lb (100.7 kg)   BMI 31.85 kg/m    Constitutional:  Alert and oriented, No acute distress. Cardiovascular: No clubbing, cyanosis, or edema. Respiratory: Normal respiratory effort, no increased work of breathing. GI: Abdomen is soft, nontender, nondistended, no abdominal masses   Pertinent Imaging: I have personally viewed and interpreted the KUB showing bilateral renal stones <12m.  Assessment & Plan:   77year old male with reported history of uric acid nephrolithiasis, bilateral small renal stones on recent KUB, worsening low back pain R>L, and mild obstructive urinary symptoms.  Regarding his history of nephrolithiasis, I think is reasonable to obtain a CT, his uric acid stones will not be visualized on KUB, will call with those results.  We discussed that renal stones are typically asymptomatic and observation is reasonable.  Recommend continuing potassium citrate, and this was refilled today.  In terms of his urinary symptoms, he failed a trial of Flomax from PCP with orthostatic type tension and a syncopal episode.  I recommended avoiding an additional trial of a different alpha-blocker with his mild symptoms and severe side effects from the Flomax.  We  discussed options like an over-the-counter supplement like saw palmetto, could also consider finasteride in the future if worsening urinary symptoms, or further investigation with cystoscopy.  Will also evaluate prostate/bladder anatomy on CT above.  PSA has been normal, most recently 0.76 in July 2023.  We reviewed the AUA guidelines that do not recommend routine screening in men over age 16.  Potassium citrate refilled Call with CT results Continue yearly follow-up for KUB/PVR   Nickolas Madrid,  MD 05/19/2022  [facility] [address], [place], [place] [address] [phone]

## 2022-05-27 ENCOUNTER — Ambulatory Visit
Admission: RE | Admit: 2022-05-27 | Discharge: 2022-05-27 | Disposition: A | Discharge status: Home / Self Care | Payer: PPO | Source: Ambulatory Visit | Attending: Urology | Admitting: Urology

## 2022-05-27 DIAGNOSIS — R1031 Right lower quadrant pain: Secondary | ICD-10-CM | POA: Insufficient documentation

## 2022-05-27 DIAGNOSIS — N200 Calculus of kidney: Secondary | ICD-10-CM | POA: Diagnosis not present

## 2022-05-27 DIAGNOSIS — K5730 Diverticulosis of large intestine without perforation or abscess without bleeding: Secondary | ICD-10-CM | POA: Diagnosis not present

## 2022-05-31 ENCOUNTER — Telehealth: Payer: Self-pay | Admitting: Urology

## 2022-05-31 NOTE — Telephone Encounter (Signed)
I called his daughter Morey Hummingbird who helps with most of his medical decisions to review CT scan.  This shows a 6 mm right distal ureter stone.  This is the likely cause of his right-sided low back pain, however he does have a history of back problems and back pain.  On review of a prior KUB from PCP on 04/19/2022 it appears that stone was present at that time.  I recommended right ureteroscopy, laser lithotripsy, stent placement.  He has some other medical issues, and has had problems with surgery before and is resistant.  She will discuss with him in more detail and they will let us know how he would like to proceed.  I think low likelihood of spontaneous passage based on KUB 04/19/2022 with stone likely present since at least then.  He cannot take Flomax secondary to orthostatic hypotension.  Other alternative would be medical expulsive therapy with follow-up in 2 to 4 weeks for repeat KUB.  Not a good candidate for shockwave lithotripsy based on stone location, size, density.  I recommended right ureteroscopy, laser lithotripsy, stent placement.  Family unsure how to proceed and will discuss amongst themselves and let us know.  Okay to schedule right ureteroscopy, laser lithotripsy, stent placement if family opts to move forward   Nickolas Madrid, MD 05/31/2022

## 2022-06-09 ENCOUNTER — Telehealth: Payer: Self-pay | Admitting: Family Medicine

## 2022-06-09 NOTE — Telephone Encounter (Signed)
Copied from [place] [id number]. Topic: Medicare AWV >> Jun 09, 2022  3:05 PM Devoria Glassing wrote: Reason for CRM: Called patient to schedule Medicare Annual Wellness Visit (AWV). No voicemail available to leave a message.  Last date of AWV: 09/02/2020   Please schedule an appointment at any time with Kirke Shaggy, LPN on AWV schedule. .  If any questions, please contact me.  Thank you ,  Sherol Dade; [place] Direct Dial: [phone]

## 2022-06-09 NOTE — Telephone Encounter (Signed)
Copied from [place] [id number]. Topic: Medicare AWV >> Jun 09, 2022  3:07 PM Devoria Glassing wrote: Reason for CRM: Called patient to schedule Medicare Annual Wellness Visit (AWV). Left message for patient to call back and schedule Medicare Annual Wellness Visit (AWV).  Last date of AWV:09/02/20  Please schedule an appointment at any time with Kirke Shaggy, LPN on AWV schedule. .  If any questions, please contact me.  Thank you ,  Sherol Dade; [place] Direct Dial: [phone]

## 2022-07-09 ENCOUNTER — Telehealth: Payer: Self-pay | Admitting: Family Medicine

## 2022-07-09 NOTE — Telephone Encounter (Signed)
Contacted Ru[facility] Stacks to schedule their annual wellness visit. Call back at later date: REQ CB 07/12/2022  Verlee Rossetti; Care Guide Ambulatory Clinical Support Nicholas l [facility] Direct Dial: [phone]

## 2022-07-26 ENCOUNTER — Ambulatory Visit: Payer: Self-pay | Admitting: *Deleted

## 2022-07-26 NOTE — Telephone Encounter (Signed)
  Chief Complaint: Increasing lower back pain with pain down one leg and pain in the opposite hip.  Asking about a neurosurgeon referral. Symptoms: see above   Pain becoming worse especially over this past weekend. Frequency: daily Pertinent Negatives: Patient denies injuries.   Had lower back surgery 7 yrs ago.  Disposition: [] ED /[] Urgent Care (no appt availability in office) / [x] Appointment(In office/virtual)/ []  [facility]/ [] Home Care/ [] Refused Recommended Disposition /[] [facility]/ []  Follow-up with PCP Additional Notes: Appt made with Dr. Althea Charon for 07/27/2022 at 1:20.

## 2022-07-26 NOTE — Telephone Encounter (Signed)
Reason for Disposition  [1] Pain radiates into the thigh or further down the leg AND [2] one leg  Answer Assessment - Initial Assessment Questions 1. ONSET: "When did the pain begin?"      Daughter calling in.    Pt. Is having back pain.   He has seen Dr. Althea Charon 3 mo. Ago due to this pain.    An x ray was done.   He has a rod and a metal cage in his back from 7 yrs ago.   He was going to refer him to a neurosurgeon.    We never got a referral for the neurosurgeon so we have not seen one.    The pain is getting worse. 2. LOCATION: "Where does it hurt?" (upper, mid or lower back)     His lower back is hurting worse.  He is having trouble walking.   3. SEVERITY: "How bad is the pain?"  (e.g., Scale 1-10; mild, moderate, or severe)   - MILD (1-3): Doesn't interfere with normal activities.    - MODERATE (4-7): Interferes with normal activities or awakens from sleep.    - SEVERE (8-10): Excruciating pain, unable to do any normal activities.      Moderate back pain 4. PATTERN: "Is the pain constant?" (e.g., yes, no; constant, intermittent)      Has more relief when sitting.    5. RADIATION: "Does the pain shoot into your legs or somewhere else?"     Pain in his left leg and right hip and across his lower back area. 6. CAUSE:  "What do you think is causing the back pain?"      See above regarding surgery 7. BACK OVERUSE:  "Any recent lifting of heavy objects, strenuous work or exercise?"     No 8. MEDICINES: "What have you taken so far for the pain?" (e.g., nothing, acetaminophen, NSAIDS)     Using Tylenol and NSAID but they have limited help.   He tried the lidocaine patches and heating pad. 9. NEUROLOGIC SYMPTOMS: "Do you have any weakness, numbness, or problems with bowel/bladder control?"     His left leg is harder to move since Friday.   It's an effort for him to pick up his legs and walk. 10. OTHER SYMPTOMS: "Do you have any other symptoms?" (e.g., fever, abdomen pain, burning with  urination, blood in urine)       See above 11. PREGNANCY: "Is there any chance you are pregnant?" "When was your last menstrual period?"       N/A  Protocols used: Back Pain-A-AH

## 2022-07-27 ENCOUNTER — Encounter: Payer: Self-pay | Admitting: Family Medicine

## 2022-07-27 ENCOUNTER — Ambulatory Visit (INDEPENDENT_AMBULATORY_CARE_PROVIDER_SITE_OTHER): Payer: PPO | Admitting: Family Medicine

## 2022-07-27 VITALS — BP 122/60 | HR 76 | Ht 70.0 in | Wt 222.0 lb

## 2022-07-27 DIAGNOSIS — N183 Chronic kidney disease, stage 3 unspecified: Secondary | ICD-10-CM | POA: Diagnosis not present

## 2022-07-27 DIAGNOSIS — G629 Polyneuropathy, unspecified: Secondary | ICD-10-CM

## 2022-07-27 DIAGNOSIS — M4726 Other spondylosis with radiculopathy, lumbar region: Secondary | ICD-10-CM | POA: Diagnosis not present

## 2022-07-27 DIAGNOSIS — M8448XA Pathological fracture, other site, initial encounter for fracture: Secondary | ICD-10-CM | POA: Diagnosis not present

## 2022-07-27 DIAGNOSIS — Z981 Arthrodesis status: Secondary | ICD-10-CM | POA: Diagnosis not present

## 2022-07-27 DIAGNOSIS — R29898 Other symptoms and signs involving the musculoskeletal system: Secondary | ICD-10-CM

## 2022-07-27 DIAGNOSIS — G039 Meningitis, unspecified: Secondary | ICD-10-CM | POA: Diagnosis not present

## 2022-07-27 DIAGNOSIS — I129 Hypertensive chronic kidney disease with stage 1 through stage 4 chronic kidney disease, or unspecified chronic kidney disease: Secondary | ICD-10-CM

## 2022-07-27 MED ORDER — TRAMADOL HCL 50 MG PO TABS: 50.0000 mg | ORAL_TABLET | Freq: Four times a day (QID) | ORAL | 0 refills | Status: AC | PRN

## 2022-07-27 NOTE — Progress Notes (Signed)
Subjective:    Patient ID: Danny Patterson, male    DOB: Oct 27, 1945, 77 y.o.   MRN: [id number]  Danny Patterson is a 78 y.o. male presenting on 07/27/2022 for Back Pain  Here with daughter.  HPI  He has chronic history of Back Pain and Problems  Recently within past 1 week significant worsening pain and mobility weakness. This past week with significant increased pain. Lower back bilateral, initially R side then shifted to Left and across back. He has some sciatica symptoms into lower extremity with some numbness into toes, Left side. Notable worsening with significant weakness recently within the past week, causing him to be mostly immobile, difficulty with lifting his Left leg in order to step. He was tripping or having difficulty lifting with each step. Left sided lower extremity weakness, but Right side has some weakness as well.  He is in wheelchair and uses cane.  He is taking Baclofen 10mg  THREE TIMES A DAY AS NEEDED Taking Aleve TWICE A DAY and Ext Str Tylenol x 2 AS NEEDED, not lasting through 8+ hours.  He takes Allopurinol 300mg  daily but skipped some doses lately  He has seen [facility]. 2017 for prior Lumbar Posterior Fusion 09/2015 He had very complicated course  Documention from prior X-ray shows Lumbar X-ray shows extensive abnormality prior surgery fusion. There is notable L posterior rod broken above S1 screw, change from 2018. Some lucency on pedicle screws. We discussed back in January if he wanted to pursue MRI / Neurosurgery, decision was made by patient to defer at that time. Now he is ready to pursue      Past Surgical History:  Procedure Laterality Date   BACK SURGERY     knot     removed from neck   POSTERIOR LUMBAR FUSION 4 LEVEL N/A 10/01/2015   Procedure: Lumbar three-four,  Lumbar four-five,  Lumbar five-Sacrum one posterior lumbar interbody fusion with Laminotomy at Lumbar Two-Three;  Surgeon: Hilda Lias, MD;   Location: [facility];  Service: Neurosurgery;  Laterality: N/A;         07/27/2022    1:44 PM 10/14/2021    3:56 PM 12/26/2019    1:47 PM  Depression screen PHQ 2/9  Decreased Interest 0 0 0  Down, Depressed, Hopeless 0 0 0  PHQ - 2 Score 0 0 0    Social History   Tobacco Use   Smoking status: Former    Types: Cigarettes   Smokeless tobacco: Former  Substance Use Topics   Alcohol use: Not Currently    Alcohol/week: 3.0 standard drinks of alcohol    Types: 3 Cans of beer per week    Comment: 05/04/16 3 beers daily   Drug use: No    Review of Systems Per HPI unless specifically indicated above     Objective:    BP 122/60   Pulse 76   Ht 5\' 10"  (1.778 m)   Wt 222 lb (100.7 kg)   SpO2 96%   BMI 31.85 kg/m   Wt Readings from Last 3 Encounters:  07/27/22 222 lb (100.7 kg)  05/19/22 222 lb (100.7 kg)  04/19/22 230 lb (104.3 kg)    Physical Exam Vitals and nursing note reviewed.  Constitutional:      General: He is not in acute distress.    Appearance: He is well-developed. He is not diaphoretic.     Comments: Well-appearing, comfortable, cooperative  HENT:     Head: Normocephalic and  atraumatic.  Eyes:     General:        Right eye: No discharge.        Left eye: No discharge.     Conjunctiva/sclera: Conjunctivae normal.  Neck:     Thyroid: No thyromegaly.  Cardiovascular:     Rate and Rhythm: Normal rate and regular rhythm.     Pulses: Normal pulses.     Heart sounds: Normal heart sounds. No murmur heard. Pulmonary:     Effort: Pulmonary effort is normal. No respiratory distress.     Breath sounds: Normal breath sounds. No wheezing or rales.  Musculoskeletal:     Cervical back: Normal range of motion and neck supple.     Comments: Wheelchair. Unable to weight bear without assistance Very limited 2/5 strength left hip flexion, 3/5 knee flexion, ankle dorsiflexion is limited 3/5 Seems progression of baseline  R side is improved but still has  weakness.  Pain localized SI bilateral lower back region.  Lymphadenopathy:     Cervical: No cervical adenopathy.  Skin:    General: Skin is warm and dry.     Findings: No erythema or rash.  Neurological:     Mental Status: He is alert and oriented to person, place, and time. Mental status is at baseline.     Sensory: Sensory deficit (chronic neuropathy lower extremities) present.  Psychiatric:        Behavior: Behavior normal.     Comments: Well groomed, good eye contact, normal speech and thoughts     I have personally reviewed the radiology report from 04/19/22 on .  CLINICAL DATA:  Chronic low back pain, worse over the last month. Degenerative disc disease, lumbar.   EXAM: LUMBAR SPINE - COMPLETE 4+ VIEW   COMPARISON:  03/26/2016 CT   FINDINGS: Five non-rib-bearing lumbar vertebra. Posterior rod with intrapedicular screw fusion L3 through S1. The posterior rod is broken just above the left S1 screw, new. The lucency about the L3 screws is not definitively seen by radiograph. There are interbody spacers at L3-L4 and L5-S1. Straightening of normal lordosis. L2-L3 degenerative disc disease and facet hypertrophy. Vertebral body heights are normal. No evidence of fracture focal bone lesions. The sacroiliac joints are congruent.   IMPRESSION: 1. Posterior rod with intrapedicular screw fusion L3 through S1. The left posterior rod is broken just above the S1 screw, new from 2018 CT. The lucency about the L3 pedicle screws on prior CT is not seen by radiograph 2. L2-L3 degenerative disc disease and facet hypertrophy.     Electronically Signed   By: Narda Rutherford M.D.   On: 04/19/2022 23:16   Results for orders placed or performed in visit on 05/19/22  Microscopic Examination   Urine  Result Value Ref Range   WBC, UA 0-5 0 - 5 /hpf   RBC, Urine 0-2 0 - 2 /hpf   Epithelial Cells (non renal) 0-10 0 - 10 /hpf   Casts Present (A) None seen /lpf   Cast Type Hyaline  casts N/A   Mucus, UA Present (A) Not Estab.   Bacteria, UA Moderate (A) None seen/Few  Urinalysis, Complete  Result Value Ref Range   Specific Gravity, UA 1.020 1.005 - 1.030   pH, UA 5.0 5.0 - 7.5   Color, UA Yellow Yellow   Appearance Ur Clear Clear   Leukocytes,UA Negative Negative   Protein,UA 2+ (A) Negative/Trace   Glucose, UA Negative Negative   Ketones, UA Trace (A) Negative   RBC,  UA Negative Negative   Bilirubin, UA Negative Negative   Urobilinogen, Ur 0.2 0.2 - 1.0 mg/dL   Nitrite, UA Negative Negative   Microscopic Examination See below:       Assessment & Plan:   Problem List Items Addressed This Visit     Benign hypertension with CKD (chronic kidney disease) stage III (HCC)   Relevant Orders   BASIC METABOLIC PANEL WITH GFR   Osteoarthritis of spine with radiculopathy, lumbar region - Primary   Relevant Medications   traMADol (ULTRAM) 50 MG tablet   Other Relevant Orders   MR Lumbar Spine W Wo Contrast   Polyneuropathy   Relevant Medications   traMADol (ULTRAM) 50 MG tablet   Other Relevant Orders   MR Lumbar Spine W Wo Contrast   Other Visit Diagnoses     S/P lumbar spinal fusion       Relevant Medications   traMADol (ULTRAM) 50 MG tablet   Other Relevant Orders   MR Lumbar Spine W Wo Contrast   Weakness of left lower extremity       Musculoskeletal instability of both lower extremities           Advanced OA/DDD Lumbar spine with radicular/myelopathy, s/p lumbar spinal fusion 2017 Chronic neuropathy associated  Recent subacute progression of lower extremity neuropathy and weakness  Prior X-ray 03/2022 showed broken rod from prior hardware spinal surgery.  Patient declined to pursue referral and advanced imaging at that time.  Today we will pursue lab BMET for Chemistry today for Kidney Function check.  Ordered Lumbar MRI with and without contrast. They may change location based on open / size machine for Lumbar MRI  Pending MRI result -  will pursue referral  Dr Darlen Round Neurosurgery at [facility]) / Surgery performed at [facility] [address] [place], [place] [address] Ph - [phone] (to refer)  Ordered Tramadol as needed AS NEEDED for pain now He cannot tolerate stronger opioids. Already failed other non opioid therapy   Orders Placed This Encounter  Procedures   MR Lumbar Spine W Wo Contrast    Standing Status:   Future    Standing Expiration Date:   07/27/2023    Order Specific Question:   If indicated for the ordered procedure, I authorize the administration of contrast media per Radiology protocol    Answer:   Yes    Order Specific Question:   What is the patient's sedation requirement?    Answer:   No Sedation    Order Specific Question:   Does the patient have a pacemaker or implanted devices?    Answer:   Yes    Order Specific Question:   Manufacturer of pacemake or implanted device?    Answer:   lumbar spine hardware from prior spinal fusion 2017 [facility]    Order Specific Question:   Call Results- Best Contact Number?    Answer:   [phone]    Order Specific Question:   Preferred imaging location?    Answer:   [facility] (table limit-350lbs)   BASIC METABOLIC PANEL WITH GFR     Meds ordered this encounter  Medications   traMADol (ULTRAM) 50 MG tablet    Sig: Take 1 tablet (50 mg total) by mouth every 6 (six) hours as needed for up to 5 days.    Dispense:  20 tablet    Refill:  0     Follow up plan: Return if symptoms worsen or fail  to improve.   [name], DO [facility] 07/27/2022, 1:56 PM

## 2022-07-27 NOTE — Patient Instructions (Addendum)
Thank you for coming to the office today.  Chemistry today for Kidney Function check.  [facility]) / Surgery performed at [facility] [address] [place], [place] [address] Ph - [phone] (to refer)  Ordered Lumbar MRI with and without contrast.  If there is issue with smaller machine, we can switch location. Let me know.  Ordered Tramadol as needed.  Please schedule a Follow-up Appointment to: Return if symptoms worsen or fail to improve.  If you have any other questions or concerns, please feel free to call the office or send a message through MyChart. You may also schedule an earlier appointment if necessary.  Additionally, you may be receiving a survey about your experience at our office within a few days to 1 week by e-mail or mail. We value your feedback.  [name], DO [facility], [place]

## 2022-07-28 LAB — BASIC METABOLIC PANEL WITH GFR
BUN/Creatinine Ratio: 23 (calc) — ABNORMAL HIGH (ref 6–22)
BUN: 31 mg/dL — ABNORMAL HIGH (ref 7–25)
CO2: 29 mmol/L (ref 20–32)
Calcium: 9.4 mg/dL (ref 8.6–10.3)
Chloride: 100 mmol/L (ref 98–110)
Creat: 1.35 mg/dL — ABNORMAL HIGH (ref 0.70–1.28)
Glucose, Bld: 88 mg/dL (ref 65–139)
Potassium: 4.4 mmol/L (ref 3.5–5.3)
Sodium: 138 mmol/L (ref 135–146)
eGFR: 54 mL/min/{1.73_m2} — ABNORMAL LOW (ref >=60)

## 2022-08-03 ENCOUNTER — Ambulatory Visit
Admission: RE | Admit: 2022-08-03 | Discharge: 2022-08-03 | Disposition: A | Discharge status: Home / Self Care | Payer: PPO | Source: Ambulatory Visit | Attending: Family Medicine | Admitting: Family Medicine

## 2022-08-03 DIAGNOSIS — Z981 Arthrodesis status: Secondary | ICD-10-CM | POA: Diagnosis not present

## 2022-08-03 DIAGNOSIS — G629 Polyneuropathy, unspecified: Secondary | ICD-10-CM | POA: Insufficient documentation

## 2022-08-03 DIAGNOSIS — M4726 Other spondylosis with radiculopathy, lumbar region: Secondary | ICD-10-CM | POA: Diagnosis not present

## 2022-08-03 DIAGNOSIS — M545 Low back pain, unspecified: Secondary | ICD-10-CM | POA: Diagnosis not present

## 2022-08-03 MED ORDER — GADOBUTROL 1 MMOL/ML IV SOLN
10.0000 mL | Freq: Once | INTRAVENOUS | Status: AC | PRN
  Administered 2022-08-03: 10 mL via INTRAVENOUS

## 2022-08-09 ENCOUNTER — Telehealth: Payer: Self-pay

## 2022-08-09 ENCOUNTER — Ambulatory Visit: Payer: Self-pay | Admitting: *Deleted

## 2022-08-09 NOTE — Telephone Encounter (Signed)
Left message for patient regarding results.  Please notify patient of Lumbar MRI results and that we will be submitted referrals to both [facility] Neurosurgery & Orthpedics for evaluation of these issues.   MRI shows several abnormalities of lower lumbar spine with prior fusion surgery, some nerve root clumping, and degenerative arthritis, also with some sacral insufficiency fracture also identified. I will be sending referral to both Dr Myer Haff and to [facility] orthopedics for consult on the sacrum. Stay tuned for apt at their office.

## 2022-08-09 NOTE — Addendum Note (Signed)
Addended by: Smitty Cords on: 08/09/2022 02:44 PM   Modules accepted: Orders

## 2022-08-09 NOTE — Telephone Encounter (Signed)
-----   Message from Smitty Cords, DO sent at 08/09/2022  2:41 PM EDT ----- Carollee Herter, Please notify patient of Lumbar MRI results and that we will be submitted referrals to both [facility] Neurosurgery & Orthpedics for evaluation of these issues.  MRI shows several abnormalities of lower lumbar spine with prior fusion surgery, some nerve root clumping, and degenerative arthritis, also with some sacral insufficiency fracture also identified. I will be sending referral to both Dr Myer Haff and to [facility] orthopedics for consult on the sacrum. Stay tuned for apt at their office.  Lowella Bandy - can you follow up with me on scheduling these two apt, let me know if there is a large delay. I am not sure if the sacral insufficiency fracture for orthopedic may require sooner more urgent referral. Depending on when they can get him in. Thank you  [name], DO [facility] 08/09/2022, 2:40 PM

## 2022-08-09 NOTE — Telephone Encounter (Signed)
Call from Erskine Squibb at [facility] radiology  - report was addended.    ADDENDUM REPORT: 08/09/2022 09:00   ADDENDUM: Not mentioned above:   Bone marrow edema in the right and left sacral ala with a longitudinal component most consistent with acute bilateral sacral insufficiency fractures.     Electronically Signed   By: Elige Ko M.D.   On: 08/09/2022 09:00

## 2022-08-09 NOTE — Addendum Note (Signed)
Addended by: Smitty Cords on: 08/09/2022 02:38 PM   Modules accepted: Orders

## 2022-08-09 NOTE — Telephone Encounter (Signed)
Reason for Disposition  [1] Follow-up call to recent contact AND [2] information only call, no triage required  Answer Assessment - Initial Assessment Questions 1. REASON FOR CALL or QUESTION: "What is your reason for calling today?" or "How can I best help you?" or "What question do you have that I can help answer?"     Daughter Danny Patterson called in for MRI results of the spine.  Protocols used: Information Only Call - No Triage-A-AH

## 2022-08-09 NOTE — Telephone Encounter (Signed)
Daughter, Deano Parello given MRI results per notes of Dr. Althea Charon on 08/09/2022 at 2:41 PM.   Daughter verbalized understanding.  I let her know the 2 referrals would be contacting him to set up the appts.   Call us back if they had not heard anything in 2 weeks from either of the referrals.

## 2022-08-09 NOTE — Telephone Encounter (Signed)
I have reviewed the addendum and updated his results and submitted both referrals to Neurosurgery & Orthopedics at [facility].  Refer to Neurosurg for spine and Ortho for the sacrum.  [name], DO [facility] 08/09/2022, 2:46 PM

## 2022-08-19 DIAGNOSIS — G6281 Critical illness polyneuropathy: Secondary | ICD-10-CM | POA: Diagnosis not present

## 2022-08-19 DIAGNOSIS — M5416 Radiculopathy, lumbar region: Secondary | ICD-10-CM | POA: Diagnosis not present

## 2022-08-19 DIAGNOSIS — E785 Hyperlipidemia, unspecified: Secondary | ICD-10-CM | POA: Diagnosis not present

## 2022-08-19 DIAGNOSIS — R29898 Other symptoms and signs involving the musculoskeletal system: Secondary | ICD-10-CM | POA: Diagnosis not present

## 2022-08-19 DIAGNOSIS — E1169 Type 2 diabetes mellitus with other specified complication: Secondary | ICD-10-CM | POA: Diagnosis not present

## 2022-08-19 DIAGNOSIS — M8448XS Pathological fracture, other site, sequela: Secondary | ICD-10-CM | POA: Diagnosis not present

## 2022-08-27 NOTE — Progress Notes (Addendum)
Referring Physician:  Smitty Cords, DO [address] [place],  [place] [address]  Primary Physician:  Smitty Cords, DO  History of Present Illness: 09/01/2022 Mr. Danny Patterson has a history of hyperlipidemia, HTN, OSA, lewy body dementia, polyneuropathy, stage 3a CKD.   Lumbar fusion done in 2017. Helped with his pain initially, but not his mobility/stiffness.   He saw ortho on 08/20/22 for back and leg pain along with bilateral sacral insufficiency fractures.   Given prednisone taper by ortho on 08/19/22 that helped.   He has constant LBP that has been worse in last 6-8 weeks. Also in last 2 weeks, he has not been able to drive or walk well due to pain. No leg pain, but he has difficulty walking because his legs and back feel fatigued. Also has pain with standing. No pain with sitting. He walks very hunched forward per his daughter. He has some numbness in his legs. He has weakness in his legs.   He's had a foot drop on right since prior to his surgery. He has noted new weakness on left.   He rarely takes ultram.   Bowel/Bladder Dysfunction: none  Conservative measures:  Physical therapy: has not participated  Multimodal medical therapy including regular antiinflammatories: tylenol, baclofen, neurontin, naprosyn, ultram, prednisone Injections: has not received epidural steroid injections since his surgery   Past Surgery: Lumbar fusion in 2017 by Dr Jeral Fruit in [place] has no symptoms of cervical myelopathy.  The symptoms are causing a significant impact on the patient's life.   Review of Systems:  A 10 point review of systems is negative, except for the pertinent positives and negatives detailed in the HPI.  Past Medical History: Past Medical History:  Diagnosis Date   Allergy    Arthritis    Gout    Hyperlipidemia    Hyperlipidemia    Hypertension    Kidney stone    Rheumatic fever     Past Surgical History: Past Surgical History:   Procedure Laterality Date   BACK SURGERY     knot     removed from neck   POSTERIOR LUMBAR FUSION 4 LEVEL N/A 10/01/2015   Procedure: Lumbar three-four,  Lumbar four-five,  Lumbar five-Sacrum one posterior lumbar interbody fusion with Laminotomy at Lumbar Two-Three;  Surgeon: Hilda Lias, MD;  Location: [facility];  Service: Neurosurgery;  Laterality: N/A;    Allergies: Allergies as of 09/01/2022 - Review Complete 09/01/2022  Allergen Reaction Noted   Ace inhibitors Other (See Comments) and Itching 02/18/2015   Angiotensin  05/18/2001   Angiotensin ii Other (See Comments) 05/18/2001   Codeine Other (See Comments) 03/29/2022   Flomax [tamsulosin hcl] Other (See Comments) 05/19/2022   Losartan Swelling and Other (See Comments) 03/03/2006    Medications: Outpatient Encounter Medications as of 09/01/2022  Medication Sig   POTASSIUM PO Take 1 tablet by mouth daily. OTC. Takes 1 tablet per day in addition to the potassium citrate   Saw Palmetto, Serenoa repens, (SAW PALMETTO PO) Take by mouth daily.   traMADol (ULTRAM) 50 MG tablet Take 50 mg by mouth as needed.   acetaminophen (TYLENOL) 500 MG tablet Take 2 tablets (1,000 mg total) by mouth every 8 (eight) hours as needed.   allopurinol (ZYLOPRIM) 300 MG tablet Take 1 tablet (300 mg total) by mouth daily.   amLODipine (NORVASC) 5 MG tablet Take 1 tablet (5 mg total) by mouth daily.   atorvastatin (LIPITOR) 20 MG tablet Take  1 tablet (20 mg total) by mouth daily at 6 PM.   cholecalciferol (VITAMIN D) 1000 units tablet Take 1,000 Units daily by mouth.   colchicine 0.6 MG tablet Take 0.6 mg by mouth daily as needed.   furosemide (LASIX) 20 MG tablet TAKE 1-2 TABLETS (20-40 MG TOTAL) BY MOUTH DAILY AS NEEDED FOR EDEMA.   HORSE CHESTNUT PO Take by mouth.   magnesium gluconate (MAGONATE) 500 MG tablet Take 500 mg by mouth daily.   potassium citrate (UROCIT-K) 10 MEQ (1080 MG) SR tablet Take 10 mEq by mouth 3 (three) times daily with  meals.   [DISCONTINUED] baclofen (LIORESAL) 10 MG tablet Take 0.5-1 tablets (5-10 mg total) by mouth 3 (three) times daily as needed for muscle spasms.   No facility-administered encounter medications on file as of 09/01/2022.    Social History: Social History   Tobacco Use   Smoking status: Former    Types: Cigarettes   Smokeless tobacco: Former  Substance Use Topics   Alcohol use: Not Currently    Alcohol/week: 3.0 standard drinks of alcohol    Types: 3 Cans of beer per week    Comment: 05/04/16 3 beers daily   Drug use: No    Family Medical History: Family History  Problem Relation Age of Onset   Parkinson's disease Father 85   Kidney disease Maternal Grandmother    Hypertension Sister    Kidney cancer Neg Hx    Prostate cancer Neg Hx     Physical Examination: Vitals:   09/01/22 1118  BP: 124/68    General: Patient is well developed, well nourished, calm, collected, and in no apparent distress. Attention to examination is appropriate.  Respiratory: Patient is breathing without any difficulty.   NEUROLOGICAL:     Awake, alert, oriented to person, place, and time.  Speech is clear and fluent. Fund of knowledge is appropriate.   Cranial Nerves: Pupils equal round and reactive to light.  Facial tone is symmetric.    Well healed lumbar incision Mild lower posterior lumbar tenderness.   No abnormal lesions on exposed skin.   Strength: Side Biceps Triceps Deltoid Interossei Grip Wrist Ext. Wrist Flex.  R 5 5 5 5 5 5 5   L 5 5 5 5 5 5 5    Side Iliopsoas Quads Hamstring PF DF EHL  R 5 5 5 5 3 3   L 5 5 5 5 4 4    Reflexes are 1+ and symmetric at the biceps, triceps, brachioradialis, patella and achilles.   Hoffman's is absent.  Clonus is not present.   Bilateral upper and lower extremity sensation is intact to light touch.     Slow hunched forward gait.   Medical Decision Making  Imaging: Lumbar MRI dated 08/03/22:  FINDINGS: Segmentation:  Standard.    Alignment: 2 mm retrolisthesis of L3 on L4 and L4 on L5. Dextrocurvature of the lumbar spine.   Vertebrae: No acute fracture, evidence of discitis, or aggressive bone lesion.   Conus medullaris and cauda equina: Conus extends to the T12 level. Conus and cauda equina appear normal. Clumping of the nerve roots at the level of T12-L1, L3-4 and L4-5 as can be seen with arachnoiditis.   Paraspinal and other soft tissues: No acute paraspinal abnormality. Postsurgical changes in the posterior paraspinal soft tissues at L3-S1. Postoperative seroma in the posterior paraspinal soft tissues at L4-5 measuring 2.2 x 2 x 5.1 cm.   Disc levels:   Disc spaces: Posterior lumbar interbody fusion from L3  through S1 with posterior decompression. Degenerative disease with disc height loss at L1-2 and L2-3.   T12-L1: Mild broad-based disc bulge. Moderate bilateral facet arthropathy. No spinal stenosis. No foraminal stenosis.   L1-L2: Broad-based disc osteophyte complex. Mild bilateral facet arthropathy. No left foraminal stenosis. Severe right foraminal stenosis. No spinal stenosis.   L2-L3: Broad-based disc osteophyte complex. Mild bilateral facet arthropathy. Mild spinal stenosis. Bilateral lateral recess stenosis. Moderate bilateral foraminal stenosis.   L3-L4: Interbody fusion and posterior decompression. Moderate right foraminal stenosis. Mild left foraminal stenosis.   L4-L5: Interbody fusion and posterior decompression. Posterior osseous ridging. No spinal stenosis. Moderate right foraminal stenosis. No left foraminal stenosis.   L5-S1: Interbody fusion and posterior decompression. Posterior osseous ridging. Fibrosis along the posterior thecal sac. Moderate spinal stenosis. Moderate right and mild left foraminal stenosis.   IMPRESSION: 1. Posterior lumbar interbody fusion from L3 through S1 with posterior decompression as described above. 2. Clumping of the nerve roots at the level  of T12-L1, L3-4 and L4-5 as can be seen with arachnoiditis. 3. Lumbar spine spondylosis as described above. 4. No acute osseous injury of the lumbar spine.   Electronically Signed: By: Elige Ko M.D. On: 08/09/2022 08:53  ADDENDUM REPORT: 08/09/2022 09:00   ADDENDUM: Not mentioned above:   Bone marrow edema in the right and left sacral ala with a longitudinal component most consistent with acute bilateral sacral insufficiency fractures.     Electronically Signed   By: Elige Ko M.D.   On: 08/09/2022 09:00   Lumbar xrays dated 04/19/22:  FINDINGS: Five non-rib-bearing lumbar vertebra. Posterior rod with intrapedicular screw fusion L3 through S1. The posterior rod is broken just above the left S1 screw, new. The lucency about the L3 screws is not definitively seen by radiograph. There are interbody spacers at L3-L4 and L5-S1. Straightening of normal lordosis. L2-L3 degenerative disc disease and facet hypertrophy. Vertebral body heights are normal. No evidence of fracture focal bone lesions. The sacroiliac joints are congruent.   IMPRESSION: 1. Posterior rod with intrapedicular screw fusion L3 through S1. The left posterior rod is broken just above the S1 screw, new from 2018 CT. The lucency about the L3 pedicle screws on prior CT is not seen by radiograph 2. L2-L3 degenerative disc disease and facet hypertrophy.     Electronically Signed   By: Narda Rutherford M.D.   On: 04/19/2022 23:16  CT myelogram of lumbar spine dated 03/26/16:  CT LUMBAR MYELOGRAM FINDINGS:   Trace retrolisthesis is again seen of L2 on L3, L3 on L4, and L4 on L5. Sequelae of interval L3-S1 posterior fusion are identified. There is lucency about both L3 pedicle screws. The tips of the S1 screws extend anterior to the vertebral body. Interbody implants are present at L3-4 and L5-S1 with subsidence at both levels, most into the L3 inferior endplate. Solid interbody osseous fusion is  not identified at either level. Posterolateral bone grafting has been performed from L3-S1. There is prominent lucency surrounding the L3 pedicle screw tulip heads within the fusion mass, particularly on the right. There may also be a nondisplaced fracture through the superior aspect of the right-sided fusion mass above the L3 screw head.   The conus medullaris terminates at the lower T12 level. There is a disorganized appearance of the cauda equina in the lower lumbar spinal canal at L4 and L5 with nerve root clumping and loculated subarachnoid contrast material compatible with arachnoiditis. There is a 2.5 x 2.0 cm fluid collection in  the L4-5 laminectomy bed which contains contrast, more likely secondary to the injection at this level rather than reflecting a pre-existing CSF leak. Nonobstructing bilateral renal calculi and abdominal aortic atherosclerosis are noted. There is mild ectasia of the common iliac arteries.   T11-12: Mild disc space narrowing and prominent vacuum disc. Mild disc bulging and moderate bilateral facet arthrosis result in minimal bilateral neural foraminal narrowing without spinal stenosis.   T12-L1: Shallow left paracentral calcified disc protrusion and right greater than left facet arthrosis without stenosis, unchanged.   L1-2: Vacuum disc phenomenon. Shallow left paracentral disc osteophyte complex without spinal stenosis. There is a large right far lateral marginal osteophyte with bone extending across the anterior aspect of the right neural foramen. The exiting right L1 nerve is displaced posteriorly without clear compression.   L2-3: Prominent vacuum disc. Circumferential disc bulging and facet and ligamentum flavum hypertrophy result in mild bilateral lateral recess stenosis and moderate left neural foraminal stenosis. Spinal canal and lateral recess patency are improved compared to the prior MRI.   L3-4: Interval posterior decompression and  fusion. No residual spinal stenosis. Osseous spurring and disc bulging result in moderate right greater than left neural foraminal stenosis.   L4-5: Wide posterior decompression, extended in the interim with new posterior fusion. Moderate disc space narrowing. Left lateral recess patency is improved from the prior study, although there is residual lateral recess narrowing/distortion predominantly due to left paracentral osteophytes. Facet and endplate spurring result in moderate residual bilateral neural foraminal stenosis.   L5-S1: Interval wide posterior decompression and fusion. Bone graft material left of midline and extends posterior to the disc space margin and into the left lateral recess in close proximity to but without evidence of compression of the left S1 nerve root. There is facet arthrosis with a medial right-sided facet spur which mildly medially displaces the traversing right S1 nerve root, although right lateral recess patency is improved compared to the prior study. Endplate spurring and facet arthrosis result in moderate right greater than left neural foraminal stenosis, improved from prior.   IMPRESSION: 1. Interval L3-S1 fusion. Loosening of both L3 pedicle screws. No solid interbody osseous fusion at L3-4 or L5-S1. 2. Mild lateral recess stenosis at L2-3, improved from prior. Moderate left foraminal stenosis. 3. Improved lateral recess patency at L4-5 and L5-S1. 4. Moderate biforaminal stenosis at L3-4, L4-5, and L5-S1. 5. 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 6. Aortic atherosclerosis.     Electronically Signed   By: Sebastian Ache M.D.   On: 03/26/2016 17:28  I have personally reviewed the images and agree with the above interpretation.  Assessment and Plan: Mr. Kapla is a pleasant 77 y.o. male had lumbar fusion done in 2017. Postop course complicated by ICU stay. Since surgery he has been walking hunched forward.   He's had a foot drop  on right since prior to his surgery. He has noted new weakness on left. Overall, he has seen decline in mobility in the last 6-8 weeks- he has constant LBP and difficulty walking (legs feel weak and tired). He was able to drive and walk on his own now he is very limited. No known fall or injury.   He has known bilateral sacral insufficiency fractures and this is likely cause of worsening back pain. Xrays from January show a broken rod on left and CT from 2018 shows fusion was not healed.   Treatment options discussed with patient and following plan made:   - Unsure of  cause of weakness on left foot. Appears right foot drop is chronic. Discussed AFO, he declines.  - Will review further imaging with Dr. Myer Haff. May consider updated CT to evaluate fusion healing and/or EMG.  - He would like to avoid surgery if possible, but would like to be more mobile and independent.  - Will call his daughter Lyla Son after I review with Dr. Myer Haff. Okay with him to speak with her.   I spent a total of 40 minutes in face-to-face and non-face-to-face activities related to this patient's care today including review of outside records, review of imaging, review of symptoms, physical exam, discussion of differential diagnosis, discussion of treatment options, and documentation.   Thank you for involving me in the care of this patient.   ADDENDUM 09/02/22:  Reviewed with Dr. Myer Haff. CT renal stone study on 05/27/22 shows that he is fused from L3-S1. No compression seen on lumbar MRI. Weakness in left foot may be from peroneal nerve- recommend EMG. Will start with this, but may ultimately refer to pain management for possible SCS.   Will call his daughter and let her know.   ADDENDUM 09/06/22:  Spoke with daughter. She will discuss with him to see if he wants to get EMG. If so, can refer to neurology at [facility] for the test. She will let me know.   Drake Leach PA-C Dept. of Neurosurgery

## 2022-09-01 ENCOUNTER — Ambulatory Visit (INDEPENDENT_AMBULATORY_CARE_PROVIDER_SITE_OTHER): Payer: PPO | Admitting: Orthopedic Surgery

## 2022-09-01 VITALS — BP 124/68 | Ht 65.5 in | Wt 230.4 lb

## 2022-09-01 DIAGNOSIS — M5416 Radiculopathy, lumbar region: Secondary | ICD-10-CM

## 2022-09-01 DIAGNOSIS — M8448XA Pathological fracture, other site, initial encounter for fracture: Secondary | ICD-10-CM

## 2022-09-01 DIAGNOSIS — M5442 Lumbago with sciatica, left side: Secondary | ICD-10-CM | POA: Diagnosis not present

## 2022-09-01 DIAGNOSIS — G8929 Other chronic pain: Secondary | ICD-10-CM | POA: Diagnosis not present

## 2022-09-01 DIAGNOSIS — M5441 Lumbago with sciatica, right side: Secondary | ICD-10-CM

## 2022-09-01 DIAGNOSIS — M21371 Foot drop, right foot: Secondary | ICD-10-CM

## 2022-09-01 NOTE — Patient Instructions (Signed)
It was so nice to see you today. Thank you so much for coming in.    I think that your back pain may be from the sacral insufficiency fracture. This pain will improve over the next 3 months.   The xrays you had done back in January show a broken rod. I want to review your imaging again with Dr. Myer Haff. If your fusion is healed, then nothing more needs done.   After I review everything with Dr. Myer Haff, I will call your daughter to discuss further plans/options.   Please do not hesitate to call if you have any questions or concerns. You can also message me in MyChart.   Drake Leach PA-C [phone]

## 2022-09-03 ENCOUNTER — Telehealth: Payer: Self-pay | Admitting: Orthopedic Surgery

## 2022-09-03 DIAGNOSIS — M21371 Foot drop, right foot: Secondary | ICD-10-CM

## 2022-09-03 DIAGNOSIS — G8929 Other chronic pain: Secondary | ICD-10-CM

## 2022-09-03 DIAGNOSIS — M5416 Radiculopathy, lumbar region: Secondary | ICD-10-CM

## 2022-09-03 NOTE — Telephone Encounter (Signed)
Called his daughter Lyla Son and left her a message to call me back regarding further plan for her Dad.

## 2022-09-08 NOTE — Addendum Note (Signed)
Addended byDrake Leach on: 09/08/2022 03:23 PM   Modules accepted: Orders

## 2022-09-08 NOTE — Telephone Encounter (Signed)
Patient's daughter, Lyla Son [phone]) called the office this afternoon.    She indicated that her father would like to proceed with the EMG "nerve study" on his legs.  Patient states that he is having trouble on the right side as well and inquiring if the study would include both legs.   Patient's daughter is requesting a call back to discuss.

## 2022-09-08 NOTE — Telephone Encounter (Signed)
Spoke with [employer]   Will order EMG/NCS of both legs to be done at [facility].   She will call and let us know when scheduled.   Depending on results, will likely do phone visit to review them.

## 2022-09-09 NOTE — Telephone Encounter (Signed)
Referral faxed to [facility] 

## 2022-09-16 NOTE — Telephone Encounter (Signed)
No appt scheduled yet

## 2022-09-29 NOTE — Telephone Encounter (Signed)
10/12/2022 

## 2022-10-12 NOTE — Telephone Encounter (Signed)
Danny Patterson from [facility] calling that they were unable to do his EMG due to swelling. They will work him in any day that the swelling is down.

## 2022-10-27 ENCOUNTER — Telehealth: Payer: Self-pay | Admitting: Family Medicine

## 2022-10-27 NOTE — Telephone Encounter (Signed)
Copied from CRM [id number]. Topic: Medicare AWV >> Oct 27, 2022  2:04 PM Payton Doughty wrote: Reason for CRM: LM 10/27/2022 to schedule AWV   Verlee Rossetti; Care Guide Ambulatory Clinical Support [place] [facility] Direct Dial: [phone]

## 2022-11-06 ENCOUNTER — Other Ambulatory Visit: Payer: Self-pay | Admitting: Family Medicine

## 2022-11-06 DIAGNOSIS — E790 Hyperuricemia without signs of inflammatory arthritis and tophaceous disease: Secondary | ICD-10-CM

## 2022-11-06 DIAGNOSIS — M1A0790 Idiopathic chronic gout, unspecified ankle and foot, without tophus (tophi): Secondary | ICD-10-CM

## 2022-11-08 NOTE — Telephone Encounter (Signed)
Discontinued Completed Course Smitty Cords, DO 04/19/22 1116   Requested Prescriptions  Refused Prescriptions Disp Refills   allopurinol (ZYLOPRIM) 100 MG tablet [Pharmacy Med Name: ALLOPURINOL 100 MG TABLET] 90 tablet 3    Sig: TAKE 1 TABLET BY MOUTH EVERY DAY     Endocrinology:  Gout Agents - allopurinol Failed - 11/06/2022  8:23 AM      Failed - Cr in normal range and within 360 days    Creat  Date Value Ref Range Status  07/27/2022 1.35 (H) 0.70 - 1.28 mg/dL Final   Creatinine, Urine  Date Value Ref Range Status  10/14/2021 85 20 - 320 mg/dL Final         Passed - Uric Acid in normal range and within 360 days    Uric Acid, Serum  Date Value Ref Range Status  04/12/2022 6.1 4.0 - 8.0 mg/dL Final    Comment:    Therapeutic target for gout patients: <6.0 mg/dL .    Uric Acid  Date Value Ref Range Status  02/26/2015 5.4 3.7 - 8.6 mg/dL Final    Comment:               Therapeutic target for gout patients: <6.0         Passed - Valid encounter within last 12 months    Recent Outpatient Visits           3 months ago Osteoarthritis of spine with radiculopathy, lumbar region   [facility] [place], Netta Neat, DO   6 months ago Benign hypertension with CKD (chronic kidney disease) stage III [facility])   [place] [facility] [place], Netta Neat, DO   9 months ago BPH with obstruction/lower urinary tract symptoms   [place] [facility] [place], Netta Neat, DO   1 year ago Annual physical exam   [facility] Smitty Cords, DO   2 years ago Annual physical exam   [facility] [place], Netta Neat, DO              Passed - CBC within normal limits and completed in the last 12 months    WBC  Date Value Ref Range Status  03/28/2022 5.8 4.0 - 10.5 K/uL Final   RBC  Date Value Ref Range Status  03/28/2022 3.52 (L)  4.22 - 5.81 MIL/uL Final   Hemoglobin  Date Value Ref Range Status  03/28/2022 11.2 (L) 13.0 - 17.0 g/dL Final  16/12/9602 54.0 12.6 - 17.7 g/dL Final   HCT  Date Value Ref Range Status  03/28/2022 33.6 (L) 39.0 - 52.0 % Final   Hematocrit  Date Value Ref Range Status  02/26/2015 36.5 (L) 37.5 - 51.0 % Final   MCHC  Date Value Ref Range Status  03/28/2022 33.3 30.0 - 36.0 g/dL Final   [facility]  Date Value Ref Range Status  03/28/2022 31.8 26.0 - 34.0 pg Final   MCV  Date Value Ref Range Status  03/28/2022 95.5 80.0 - 100.0 fL Final  02/26/2015 92 79 - 97 fL Final   No results found for: "PLTCOUNTKUC", "LABPLAT", "POCPLA" RDW  Date Value Ref Range Status  03/28/2022 13.9 11.5 - 15.5 % Final  02/26/2015 14.8 12.3 - 15.4 % Final

## 2022-11-10 ENCOUNTER — Other Ambulatory Visit: Payer: Self-pay | Admitting: Family Medicine

## 2022-11-10 DIAGNOSIS — E790 Hyperuricemia without signs of inflammatory arthritis and tophaceous disease: Secondary | ICD-10-CM

## 2022-11-10 DIAGNOSIS — M1A0790 Idiopathic chronic gout, unspecified ankle and foot, without tophus (tophi): Secondary | ICD-10-CM

## 2022-11-11 NOTE — Telephone Encounter (Signed)
Unable to refill per protocol, Rx expired. Discontinued 01/15/22, dose change.  Requested Prescriptions  Pending Prescriptions Disp Refills   allopurinol (ZYLOPRIM) 100 MG tablet [Pharmacy Med Name: ALLOPURINOL 100 MG TABLET] 90 tablet 3    Sig: TAKE 1 TABLET BY MOUTH EVERY DAY     Endocrinology:  Gout Agents - allopurinol Failed - 11/10/2022 11:03 AM      Failed - Cr in normal range and within 360 days    Creat  Date Value Ref Range Status  07/27/2022 1.35 (H) 0.70 - 1.28 mg/dL Final   Creatinine, Urine  Date Value Ref Range Status  10/14/2021 85 20 - 320 mg/dL Final         Passed - Uric Acid in normal range and within 360 days    Uric Acid, Serum  Date Value Ref Range Status  04/12/2022 6.1 4.0 - 8.0 mg/dL Final    Comment:    Therapeutic target for gout patients: <6.0 mg/dL .    Uric Acid  Date Value Ref Range Status  02/26/2015 5.4 3.7 - 8.6 mg/dL Final    Comment:               Therapeutic target for gout patients: <6.0         Passed - Valid encounter within last 12 months    Recent Outpatient Visits           3 months ago Osteoarthritis of spine with radiculopathy, lumbar region   [name] [place], Netta Neat, DO   6 months ago Benign hypertension with CKD (chronic kidney disease) stage III [facility])   [place] [facility] [place], Netta Neat, DO   10 months ago BPH with obstruction/lower urinary tract symptoms   [facility] [place], Netta Neat, DO   1 year ago Annual physical exam   [place] [facility] Smitty Cords, DO   2 years ago Annual physical exam   [place] [facility] [place], Netta Neat, DO              Passed - CBC within normal limits and completed in the last 12 months    WBC  Date Value Ref Range Status  03/28/2022 5.8 4.0 - 10.5 K/uL Final   RBC  Date Value Ref Range Status  03/28/2022 3.52  (L) 4.22 - 5.81 MIL/uL Final   Hemoglobin  Date Value Ref Range Status  03/28/2022 11.2 (L) 13.0 - 17.0 g/dL Final  16/12/9602 54.0 12.6 - 17.7 g/dL Final   HCT  Date Value Ref Range Status  03/28/2022 33.6 (L) 39.0 - 52.0 % Final   Hematocrit  Date Value Ref Range Status  02/26/2015 36.5 (L) 37.5 - 51.0 % Final   MCHC  Date Value Ref Range Status  03/28/2022 33.3 30.0 - 36.0 g/dL Final   [facility]  Date Value Ref Range Status  03/28/2022 31.8 26.0 - 34.0 pg Final   MCV  Date Value Ref Range Status  03/28/2022 95.5 80.0 - 100.0 fL Final  02/26/2015 92 79 - 97 fL Final   No results found for: "PLTCOUNTKUC", "LABPLAT", "POCPLA" RDW  Date Value Ref Range Status  03/28/2022 13.9 11.5 - 15.5 % Final  02/26/2015 14.8 12.3 - 15.4 % Final

## 2022-12-01 ENCOUNTER — Telehealth: Payer: Self-pay | Admitting: Family Medicine

## 2022-12-01 NOTE — Telephone Encounter (Signed)
Copied from CRM [id number]. Topic: Medicare AWV >> Dec 01, 2022  9:26 AM Payton Doughty wrote: Reason for CRM: LM 12/01/2022 to schedule AWV   Verlee Rossetti; Care Guide Ambulatory Clinical Support [place] [facility] Direct Dial: [phone]

## 2022-12-03 ENCOUNTER — Other Ambulatory Visit: Payer: Self-pay | Admitting: Family Medicine

## 2022-12-03 DIAGNOSIS — I129 Hypertensive chronic kidney disease with stage 1 through stage 4 chronic kidney disease, or unspecified chronic kidney disease: Secondary | ICD-10-CM

## 2022-12-06 NOTE — Telephone Encounter (Signed)
Patient will need an office visit for further refills. Requested Prescriptions  Pending Prescriptions Disp Refills   amLODipine (NORVASC) 5 MG tablet [Pharmacy Med Name: AMLODIPINE BESYLATE 5 MG TAB] 90 tablet 0    Sig: TAKE 1 TABLET (5 MG TOTAL) BY MOUTH DAILY.     Cardiovascular: Calcium Channel Blockers 2 Passed - 12/03/2022  4:54 PM      Passed - Last BP in normal range    BP Readings from Last 1 Encounters:  09/01/22 124/68         Passed - Last Heart Rate in normal range    Pulse Readings from Last 1 Encounters:  07/27/22 76         Passed - Valid encounter within last 6 months    Recent Outpatient Visits           4 months ago Osteoarthritis of spine with radiculopathy, lumbar region   [facility] [place], Netta Neat, DO   7 months ago Benign hypertension with CKD (chronic kidney disease) stage III [facility])   [place] [facility] [place], Netta Neat, DO   10 months ago BPH with obstruction/lower urinary tract symptoms   [place] [facility] Smitty Cords, DO   1 year ago Annual physical exam   [place] [facility] Smitty Cords, DO   2 years ago Annual physical exam   [place] [facility] [place], Netta Neat, [place]

## 2022-12-15 ENCOUNTER — Other Ambulatory Visit: Payer: Self-pay | Admitting: Family Medicine

## 2022-12-15 DIAGNOSIS — R600 Localized edema: Secondary | ICD-10-CM

## 2022-12-26 ENCOUNTER — Other Ambulatory Visit: Payer: Self-pay | Admitting: Family Medicine

## 2022-12-26 DIAGNOSIS — M1A0790 Idiopathic chronic gout, unspecified ankle and foot, without tophus (tophi): Secondary | ICD-10-CM

## 2022-12-27 NOTE — Telephone Encounter (Signed)
Requested Prescriptions  Pending Prescriptions Disp Refills   allopurinol (ZYLOPRIM) 300 MG tablet [Pharmacy Med Name: ALLOPURINOL 300 MG TABLET] 90 tablet 1    Sig: TAKE 1 TABLET BY MOUTH EVERY DAY     Endocrinology:  Gout Agents - allopurinol Failed - 12/26/2022  9:45 AM      Failed - Cr in normal range and within 360 days    Creat  Date Value Ref Range Status  07/27/2022 1.35 (H) 0.70 - 1.28 mg/dL Final   Creatinine, Urine  Date Value Ref Range Status  10/14/2021 85 20 - 320 mg/dL Final         Passed - Uric Acid in normal range and within 360 days    Uric Acid, Serum  Date Value Ref Range Status  04/12/2022 6.1 4.0 - 8.0 mg/dL Final    Comment:    Therapeutic target for gout patients: <6.0 mg/dL .    Uric Acid  Date Value Ref Range Status  02/26/2015 5.4 3.7 - 8.6 mg/dL Final    Comment:               Therapeutic target for gout patients: <6.0         Passed - Valid encounter within last 12 months    Recent Outpatient Visits           5 months ago Osteoarthritis of spine with radiculopathy, lumbar region   [facility] [place], Netta Neat, DO   8 months ago Benign hypertension with CKD (chronic kidney disease) stage III [facility])   [place] [facility] Smitty Cords, DO   11 months ago BPH with obstruction/lower urinary tract symptoms   [place] [facility] [place], Netta Neat, DO   1 year ago Annual physical exam   [facility] Smitty Cords, DO   2 years ago Annual physical exam   [place] [facility] [place], Netta Neat, DO              Passed - CBC within normal limits and completed in the last 12 months    WBC  Date Value Ref Range Status  03/28/2022 5.8 4.0 - 10.5 K/uL Final   RBC  Date Value Ref Range Status  03/28/2022 3.52 (L) 4.22 - 5.81 MIL/uL Final   Hemoglobin  Date Value Ref Range Status   03/28/2022 11.2 (L) 13.0 - 17.0 g/dL Final  42/59/5638 75.6 12.6 - 17.7 g/dL Final   HCT  Date Value Ref Range Status  03/28/2022 33.6 (L) 39.0 - 52.0 % Final   Hematocrit  Date Value Ref Range Status  02/26/2015 36.5 (L) 37.5 - 51.0 % Final   MCHC  Date Value Ref Range Status  03/28/2022 33.3 30.0 - 36.0 g/dL Final   [facility]  Date Value Ref Range Status  03/28/2022 31.8 26.0 - 34.0 pg Final   MCV  Date Value Ref Range Status  03/28/2022 95.5 80.0 - 100.0 fL Final  02/26/2015 92 79 - 97 fL Final   No results found for: "PLTCOUNTKUC", "LABPLAT", "POCPLA" RDW  Date Value Ref Range Status  03/28/2022 13.9 11.5 - 15.5 % Final  02/26/2015 14.8 12.3 - 15.4 % Final

## 2023-01-05 ENCOUNTER — Telehealth: Payer: Self-pay | Admitting: Family Medicine

## 2023-01-05 NOTE — Telephone Encounter (Signed)
Called LVM 01/05/2023 to schedule Annual Wellness Visit  Danny Patterson; Care Guide Ambulatory Clinical Support [facility] Direct Dial: [phone]

## 2023-02-12 ENCOUNTER — Other Ambulatory Visit: Payer: Self-pay | Admitting: Family Medicine

## 2023-02-12 DIAGNOSIS — E782 Mixed hyperlipidemia: Secondary | ICD-10-CM

## 2023-02-12 DIAGNOSIS — M150 Primary generalized (osteo)arthritis: Secondary | ICD-10-CM

## 2023-02-12 DIAGNOSIS — G8929 Other chronic pain: Secondary | ICD-10-CM

## 2023-02-15 NOTE — Telephone Encounter (Signed)
Requested Prescriptions  Pending Prescriptions Disp Refills   atorvastatin (LIPITOR) 20 MG tablet [Pharmacy Med Name: ATORVASTATIN 20 MG TABLET] 90 tablet 3    Sig: TAKE 1 TABLET BY MOUTH DAILY AT 6 PM.     Cardiovascular:  Antilipid - Statins Failed - 02/12/2023  8:46 PM      Failed - Lipid Panel in normal range within the last 12 months    Cholesterol, Total  Date Value Ref Range Status  02/26/2015 127 100 - 199 mg/dL Final   Cholesterol  Date Value Ref Range Status  10/14/2021 135 <200 mg/dL Final   LDL Cholesterol (Calc)  Date Value Ref Range Status  10/14/2021 57 mg/dL (calc) Final    Comment:    Reference range: <100 . Desirable range <100 mg/dL for primary prevention;   <70 mg/dL for patients with CHD or diabetic patients  with > or = 2 CHD risk factors. Marland Kitchen LDL-C is now calculated using the Martin-Hopkins  calculation, which is a validated novel method providing  better accuracy than the Friedewald equation in the  estimation of LDL-C.  Horald Pollen et al. Lenox Ahr. 1610;960(45): 2061-2068  (http://education.[employer].com/faq/FAQ164)    HDL  Date Value Ref Range Status  10/14/2021 61 > OR = 40 mg/dL Final  40/98/1191 35 (L) >39 mg/dL Final   Triglycerides  Date Value Ref Range Status  10/14/2021 88 <150 mg/dL Final         Passed - Patient is not pregnant      Passed - Valid encounter within last 12 months    Recent Outpatient Visits           6 months ago Osteoarthritis of spine with radiculopathy, lumbar region   [facility] [place], Netta Neat, DO   10 months ago Benign hypertension with CKD (chronic kidney disease) stage III [facility])   [place] [facility] Smitty Cords, DO   1 year ago BPH with obstruction/lower urinary tract symptoms   [place] [facility] Smitty Cords, DO   1 year ago Annual physical exam   [place] [facility]  Smitty Cords, DO   2 years ago Annual physical exam   [place] [facility] [place], Netta Neat, DO               baclofen (LIORESAL) 10 MG tablet [Pharmacy Med Name: BACLOFEN 10 MG TABLET] 30 tablet 5    Sig: TAKE 0.5-1 TABLETS BY MOUTH 3 TIMES DAILY AS NEEDED FOR MUSCLE SPASMS.     Analgesics:  Muscle Relaxants - baclofen Failed - 02/12/2023  8:46 PM      Failed - Cr in normal range and within 180 days    Creat  Date Value Ref Range Status  07/27/2022 1.35 (H) 0.70 - 1.28 mg/dL Final   Creatinine, Urine  Date Value Ref Range Status  10/14/2021 85 20 - 320 mg/dL Final         Failed - eGFR is 30 or above and within 180 days    GFR, Est African American  Date Value Ref Range Status  06/23/2020 73 > OR = 60 mL/min/1.8m2 Final   GFR, Est Non African American  Date Value Ref Range Status  06/23/2020 63 > OR = 60 mL/min/1.24m2 Final   GFR, Estimated  Date Value Ref Range Status  03/28/2022 52 (L) >60 mL/min Final    Comment:    (NOTE) Calculated using the  CKD-EPI Creatinine Equation (2021)    eGFR  Date Value Ref Range Status  07/27/2022 54 (L) > OR = 60 mL/min/1.53m2 Final         Failed - Valid encounter within last 6 months    Recent Outpatient Visits           6 months ago Osteoarthritis of spine with radiculopathy, lumbar region   [facility] [place], Netta Neat, DO   10 months ago Benign hypertension with CKD (chronic kidney disease) stage III [facility])   [place] [facility] Smitty Cords, DO   1 year ago BPH with obstruction/lower urinary tract symptoms   [place] [facility] Smitty Cords, DO   1 year ago Annual physical exam   [place] [facility] Smitty Cords, DO   2 years ago Annual physical exam   [facility], Netta Neat, [place]

## 2023-02-15 NOTE — Telephone Encounter (Signed)
Requested medication (s) are due for refill today: yes  Requested medication (s) are on the active medication list: yes    Last refill: 09/24/21  #90  3 refills  Future visit scheduled no  Notes to clinic:Failed due to labs, please review. Thank you  Requested Prescriptions  Pending Prescriptions Disp Refills   atorvastatin (LIPITOR) 20 MG tablet [Pharmacy Med Name: ATORVASTATIN 20 MG TABLET] 90 tablet 3    Sig: TAKE 1 TABLET BY MOUTH DAILY AT 6 PM.     Cardiovascular:  Antilipid - Statins Failed - 02/12/2023  8:46 PM      Failed - Lipid Panel in normal range within the last 12 months    Cholesterol, Total  Date Value Ref Range Status  02/26/2015 127 100 - 199 mg/dL Final   Cholesterol  Date Value Ref Range Status  10/14/2021 135 <200 mg/dL Final   LDL Cholesterol (Calc)  Date Value Ref Range Status  10/14/2021 57 mg/dL (calc) Final    Comment:    Reference range: <100 . Desirable range <100 mg/dL for primary prevention;   <70 mg/dL for patients with CHD or diabetic patients  with > or = 2 CHD risk factors. Marland Kitchen LDL-C is now calculated using the Martin-Hopkins  calculation, which is a validated novel method providing  better accuracy than the Friedewald equation in the  estimation of LDL-C.  Horald Pollen et al. Lenox Ahr. 0630;160(10): 2061-2068  (http://education.[facility].com/faq/FAQ164)    HDL  Date Value Ref Range Status  10/14/2021 61 > OR = 40 mg/dL Final  93/23/5573 35 (L) >39 mg/dL Final   Triglycerides  Date Value Ref Range Status  10/14/2021 88 <150 mg/dL Final         Passed - Patient is not pregnant      Passed - Valid encounter within last 12 months    Recent Outpatient Visits           6 months ago Osteoarthritis of spine with radiculopathy, lumbar region   [facility] [place], Netta Neat, DO   10 months ago Benign hypertension with CKD (chronic kidney disease) stage III [facility])   [facility] Smitty Cords, DO   1 year ago BPH with obstruction/lower urinary tract symptoms   [place] [facility] Smitty Cords, DO   1 year ago Annual physical exam   [place] [facility] Smitty Cords, DO   2 years ago Annual physical exam   [place] [facility] [place], Netta Neat, DO              Refused Prescriptions Disp Refills   baclofen (LIORESAL) 10 MG tablet [Pharmacy Med Name: BACLOFEN 10 MG TABLET] 30 tablet 5    Sig: TAKE 0.5-1 TABLETS BY MOUTH 3 TIMES DAILY AS NEEDED FOR MUSCLE SPASMS.     Analgesics:  Muscle Relaxants - baclofen Failed - 02/12/2023  8:46 PM      Failed - Cr in normal range and within 180 days    Creat  Date Value Ref Range Status  07/27/2022 1.35 (H) 0.70 - 1.28 mg/dL Final   Creatinine, Urine  Date Value Ref Range Status  10/14/2021 85 20 - 320 mg/dL Final         Failed - eGFR is 30 or above and within 180 days    GFR, Est African American  Date Value Ref Range Status  06/23/2020 73 > OR =  60 mL/min/1.62m2 Final   GFR, Est Non African American  Date Value Ref Range Status  06/23/2020 63 > OR = 60 mL/min/1.45m2 Final   GFR, Estimated  Date Value Ref Range Status  03/28/2022 52 (L) >60 mL/min Final    Comment:    (NOTE) Calculated using the CKD-EPI Creatinine Equation (2021)    eGFR  Date Value Ref Range Status  07/27/2022 54 (L) > OR = 60 mL/min/1.75m2 Final         Failed - Valid encounter within last 6 months    Recent Outpatient Visits           6 months ago Osteoarthritis of spine with radiculopathy, lumbar region   [facility], Netta Neat, DO   10 months ago Benign hypertension with CKD (chronic kidney disease) stage III [facility])   [place] [facility] Smitty Cords, DO   1 year ago BPH with obstruction/lower urinary tract symptoms   [place] [facility] Smitty Cords, DO   1 year ago Annual physical exam   [place] [facility] Smitty Cords, DO   2 years ago Annual physical exam   [place] [facility] [place], Netta Neat, [place]

## 2023-02-17 ENCOUNTER — Emergency Department
Admission: EM | Admit: 2023-02-17 | Discharge: 2023-02-17 | Disposition: A | Discharge status: Home / Self Care | Payer: PPO | Attending: Emergency Medicine | Admitting: Emergency Medicine

## 2023-02-17 ENCOUNTER — Encounter: Payer: Self-pay | Admitting: Emergency Medicine

## 2023-02-17 DIAGNOSIS — I129 Hypertensive chronic kidney disease with stage 1 through stage 4 chronic kidney disease, or unspecified chronic kidney disease: Secondary | ICD-10-CM | POA: Diagnosis not present

## 2023-02-17 DIAGNOSIS — N179 Acute kidney failure, unspecified: Secondary | ICD-10-CM | POA: Insufficient documentation

## 2023-02-17 DIAGNOSIS — R55 Syncope and collapse: Secondary | ICD-10-CM | POA: Diagnosis not present

## 2023-02-17 DIAGNOSIS — E1122 Type 2 diabetes mellitus with diabetic chronic kidney disease: Secondary | ICD-10-CM | POA: Insufficient documentation

## 2023-02-17 DIAGNOSIS — R0902 Hypoxemia: Secondary | ICD-10-CM | POA: Diagnosis not present

## 2023-02-17 DIAGNOSIS — N189 Chronic kidney disease, unspecified: Secondary | ICD-10-CM | POA: Insufficient documentation

## 2023-02-17 DIAGNOSIS — F0390 Unspecified dementia without behavioral disturbance: Secondary | ICD-10-CM | POA: Insufficient documentation

## 2023-02-17 DIAGNOSIS — I440 Atrioventricular block, first degree: Secondary | ICD-10-CM | POA: Diagnosis not present

## 2023-02-17 DIAGNOSIS — R252 Cramp and spasm: Secondary | ICD-10-CM | POA: Diagnosis not present

## 2023-02-17 LAB — BASIC METABOLIC PANEL
Anion gap: 14 (ref 5–15)
BUN: 35 mg/dL — ABNORMAL HIGH (ref 8–23)
CO2: 24 mmol/L (ref 22–32)
Calcium: 9 mg/dL (ref 8.9–10.3)
Chloride: 94 mmol/L — ABNORMAL LOW (ref 98–111)
Creatinine, Ser: 1.96 mg/dL — ABNORMAL HIGH (ref 0.61–1.24)
GFR, Estimated: 35 mL/min — ABNORMAL LOW (ref >60)
Glucose, Bld: 150 mg/dL — ABNORMAL HIGH (ref 70–99)
Potassium: 3.6 mmol/L (ref 3.5–5.1)
Sodium: 132 mmol/L — ABNORMAL LOW (ref 135–145)

## 2023-02-17 LAB — CBC WITH DIFFERENTIAL/PLATELET
Abs Immature Granulocytes: 0.04 10*3/uL (ref 0.00–0.07)
Basophils Absolute: 0.1 10*3/uL (ref 0.0–0.1)
Basophils Relative: 1 %
Eosinophils Absolute: 0.2 10*3/uL (ref 0.0–0.5)
Eosinophils Relative: 3 %
HCT: 38.1 % — ABNORMAL LOW (ref 39.0–52.0)
Hemoglobin: 13 g/dL (ref 13.0–17.0)
Immature Granulocytes: 1 %
Lymphocytes Relative: 22 %
Lymphs Abs: 1.4 10*3/uL (ref 0.7–4.0)
MCH: 32.3 pg (ref 26.0–34.0)
MCHC: 34.1 g/dL (ref 30.0–36.0)
MCV: 94.8 fL (ref 80.0–100.0)
Monocytes Absolute: 0.5 10*3/uL (ref 0.1–1.0)
Monocytes Relative: 8 %
Neutro Abs: 4.2 10*3/uL (ref 1.7–7.7)
Neutrophils Relative %: 65 %
Platelets: 191 10*3/uL (ref 150–400)
RBC: 4.02 MIL/uL — ABNORMAL LOW (ref 4.22–5.81)
RDW: 13.9 % (ref 11.5–15.5)
WBC: 6.4 10*3/uL (ref 4.0–10.5)
nRBC: 0 % (ref 0.0–0.2)

## 2023-02-17 LAB — TROPONIN I (HIGH SENSITIVITY): Troponin I (High Sensitivity): 17 ng/L (ref <18)

## 2023-02-17 LAB — CK: Total CK: 216 U/L (ref 49–397)

## 2023-02-17 LAB — CBG MONITORING, ED: Glucose-Capillary: 129 mg/dL — ABNORMAL HIGH (ref 70–99)

## 2023-02-17 MED ORDER — LACTATED RINGERS IV BOLUS
1000.0000 mL | Freq: Once | INTRAVENOUS | Status: AC
  Administered 2023-02-17: 1000 mL via INTRAVENOUS

## 2023-02-17 NOTE — ED Notes (Signed)
Pt provided blanket pt remains aox4 ED physician @ bs BG POC 129 Ed physician notified EKG completed and provided to ED physician pt speaking in full clear sentences family @ bs reports slight droop to left face is baseline equal bilateral upper extremity strength noted equal bilateral pt hand grasp bilateral lower extremity edema noted +2 pitting dorsal pedal pulse palpable regular lower extremity bilaterally skin warm no discoloration non labored resps noted equal bilateral chest rise and fall no stridor denies CP denies SOB denies pain

## 2023-02-17 NOTE — ED Provider Notes (Signed)
[facility] Provider Note    Event Date/Time   First MD Initiated Contact with Patient 02/17/23 2018     (approximate)   History   Chief Complaint Loss of Consciousness (EMS reports called to home of pt by family c/c possible cardiac arrest unknown pulse presence during pt syncopal event while sitting @ dinner table with family this date pt denies pain did not fall off chair slumped down toward table remaining in chair chronic bilateral leg cramping reported syncopal episode this date is 2nd in 2 weeks precipitated by c/o bilateral leg cramping just prior to LOC pt aox4 in room 8@ this time)   HPI  Danny Patterson is a 77 y.o. male with past medical history of hypertension, diabetes, CKD, and Lewy body dementia who presents to the ED following syncope.  Daughter reports that patient was sitting eating dinner with family when he suddenly slumped over and became unresponsive.  He remained unresponsive for about 2 minutes before gradually regaining consciousness.  Patient reports he remembers feeling lightheaded before the episode happened, denies any associated chest pain or shortness of breath.  He had 1 similar episode in January of this year, when he was admitted to the hospital for syncope.  Workup was reportedly unremarkable at that time and he was diagnosed with vasovagal syncope.  Patient states that he has been dealing with cramping in his groin and legs recently, but states this has been a longstanding issue for him.     Physical Exam   Triage Vital Signs: ED Triage Vitals  Encounter Vitals Group     BP 02/17/23 2018 136/66     Systolic BP Percentile --      Diastolic BP Percentile --      Pulse Rate 02/17/23 2018 77     Resp 02/17/23 2018 15     Temp --      Temp src --      SpO2 02/17/23 2018 100 %     Weight 02/17/23 2022 220 lb (99.8 kg)     Height --      Head Circumference --      Peak Flow --      Pain Score --      Pain Loc --      Pain  Education --      Exclude from Growth Chart --     Most recent vital signs: Vitals:   02/17/23 2115 02/17/23 2130  BP:    Pulse: 78 77  Resp: 19 14  SpO2: 99% 98%    Constitutional: Alert and oriented. Eyes: Conjunctivae are normal. Head: Atraumatic. Nose: No congestion/rhinnorhea. Mouth/Throat: Mucous membranes are moist.  Cardiovascular: Normal rate, regular rhythm. Grossly normal heart sounds.  2+ radial pulses bilaterally. Respiratory: Normal respiratory effort.  No retractions. Lungs CTAB. Gastrointestinal: Soft and nontender. No distention. Musculoskeletal: No lower extremity tenderness nor edema.  Neurologic:  Normal speech and language. No gross focal neurologic deficits are appreciated.    ED Results / Procedures / Treatments   Labs (all labs ordered are listed, but only abnormal results are displayed) Labs Reviewed  CBC WITH DIFFERENTIAL/PLATELET - Abnormal; Notable for the following components:      Result Value   RBC 4.02 (*)    HCT 38.1 (*)    All other components within normal limits  BASIC METABOLIC PANEL - Abnormal; Notable for the following components:   Sodium 132 (*)    Chloride 94 (*)    Glucose,  Bld 150 (*)    BUN 35 (*)    Creatinine, Ser 1.96 (*)    GFR, Estimated 35 (*)    All other components within normal limits  CBG MONITORING, ED - Abnormal; Notable for the following components:   Glucose-Capillary 129 (*)    All other components within normal limits  CK  TROPONIN I (HIGH SENSITIVITY)     EKG  ED ECG REPORT I, Chesley Noon, the attending physician, personally viewed and interpreted this ECG.   Date: 02/17/2023  EKG Time: 20:16  Rate: 73  Rhythm: normal sinus rhythm  Axis: Normal  Intervals:first-degree A-V block  and right bundle branch block  ST&T Change: None  PROCEDURES:  Critical Care performed: No  Procedures   MEDICATIONS ORDERED IN ED: Medications  lactated ringers bolus 1,000 mL (0 mLs Intravenous Stopped  02/17/23 2318)     IMPRESSION / MDM / ASSESSMENT AND PLAN / ED COURSE  I reviewed the triage vital signs and the nursing notes.                              77 y.o. male with past medical history of hypertension, diabetes, CKD, and Lewy body dementia who presents to the ED following syncopal episode at home where he slumped over and was unresponsive at the dinner table.  Patient's presentation is most consistent with acute presentation with potential threat to life or bodily function.  Differential diagnosis includes, but is not limited to, arrhythmia, vasovagal episode, orthostatic hypotension, ACS, anemia, electrolyte abnormality, AKI.  Patient nontoxic-appearing and in no acute distress, vital signs are unremarkable and he denies any complaints here in the ED.  EKG shows no evidence of arrhythmia or ischemia, troponin within normal limits.  Patient does have mild AKI but no acute electrolyte abnormality and no significant anemia or leukocytosis.  He did have admission for syncopal workup and January that was unremarkable.  Given his cramping, we will add on CK level and hydrate with IV fluids.  CK level is unremarkable, no events noted on cardiac monitor.  Patient continues to feel well on reassessment, was offered admission to the hospital but patient and family declined given he had a syncopal workup earlier this year.  Episode does sound similar as he previously syncopized after eating a large meal, again syncopized at the dinner table tonight.  Suspect vasovagal episode and he is appropriate for discharge home with PCP follow-up.  He was counseled to return to the ED for new or worsening symptoms, patient and family agree with plan.      FINAL CLINICAL IMPRESSION(S) / ED DIAGNOSES   Final diagnoses:  Syncope, unspecified syncope type  AKI (acute kidney injury) [facility])     Rx / DC Orders   ED Discharge Orders          Ordered    Ambulatory referral to Cardiology         02/17/23 2322             Note:  This document was prepared using Dragon voice recognition software and may include unintentional dictation errors.   Chesley Noon, MD 02/17/23 2325

## 2023-02-17 NOTE — ED Notes (Signed)
CCMD contacted to confirm pt on cardiac monitoring

## 2023-02-18 ENCOUNTER — Telehealth: Payer: Self-pay

## 2023-02-18 NOTE — Transitions of Care (Post Inpatient/ED Visit) (Signed)
   02/18/2023  Name: Danny Patterson MRN: [id number] DOB: October 05, 1945  Today's TOC FU Call Status: Today's TOC FU Call Status:: Unsuccessful Call (1st Attempt) Unsuccessful Call (1st Attempt) Date: 02/18/23  Attempted to reach the patient regarding the most recent Inpatient/ED visit.  Follow Up Plan: Additional outreach attempts will be made to reach the patient to complete the Transitions of Care (Post Inpatient/ED visit) call.   Signature  Kandis Fantasia, LPN [facility] Health Advisor [place] [facility] You Are. We Are. [facility] Direct Dial [phone]

## 2023-05-22 ENCOUNTER — Other Ambulatory Visit: Payer: Self-pay | Admitting: Family Medicine

## 2023-05-22 DIAGNOSIS — I129 Hypertensive chronic kidney disease with stage 1 through stage 4 chronic kidney disease, or unspecified chronic kidney disease: Secondary | ICD-10-CM

## 2023-05-24 NOTE — Telephone Encounter (Signed)
 Requested Prescriptions  Pending Prescriptions Disp Refills   amLODipine (NORVASC) 5 MG tablet [Pharmacy Med Name: AMLODIPINE BESYLATE 5 MG TAB] 30 tablet 0    Sig: TAKE 1 TABLET (5 MG TOTAL) BY MOUTH DAILY.     Cardiovascular: Calcium Channel Blockers 2 Failed - 05/24/2023  9:11 AM      Failed - Valid encounter within last 6 months    Recent Outpatient Visits           10 months ago Osteoarthritis of spine with radiculopathy, lumbar region   [facility], Netta Neat, DO   1 year ago Benign hypertension with CKD (chronic kidney disease) stage III [facility])   [place] [facility] Smitty Cords, DO   1 year ago BPH with obstruction/lower urinary tract symptoms   [place] [facility] Smitty Cords, DO   1 year ago Annual physical exam   [place] [facility] Smitty Cords, DO   2 years ago Annual physical exam    [place] Smitty Cords, DO              Passed - Last BP in normal range    BP Readings from Last 1 Encounters:  02/17/23 133/61         Passed - Last Heart Rate in normal range    Pulse Readings from Last 1 Encounters:  02/17/23 85          Courtesy refill.

## 2023-05-25 DIAGNOSIS — I10 Essential (primary) hypertension: Secondary | ICD-10-CM | POA: Diagnosis not present

## 2023-05-26 ENCOUNTER — Emergency Department (HOSPITAL_COMMUNITY)

## 2023-05-26 ENCOUNTER — Observation Stay (HOSPITAL_COMMUNITY)

## 2023-05-26 ENCOUNTER — Encounter (HOSPITAL_COMMUNITY): Payer: Self-pay

## 2023-05-26 ENCOUNTER — Inpatient Hospital Stay (HOSPITAL_COMMUNITY)
Admission: EM | Admit: 2023-05-26 | Discharge: 2023-06-10 | DRG: 056 | Disposition: A | Discharge status: Hospice | Attending: Internal Medicine | Admitting: Internal Medicine

## 2023-05-26 ENCOUNTER — Encounter (HOSPITAL_COMMUNITY)

## 2023-05-26 DIAGNOSIS — I4510 Unspecified right bundle-branch block: Secondary | ICD-10-CM | POA: Diagnosis present

## 2023-05-26 DIAGNOSIS — I63421 Cerebral infarction due to embolism of right anterior cerebral artery: Secondary | ICD-10-CM | POA: Diagnosis not present

## 2023-05-26 DIAGNOSIS — N179 Acute kidney failure, unspecified: Secondary | ICD-10-CM | POA: Diagnosis not present

## 2023-05-26 DIAGNOSIS — I6521 Occlusion and stenosis of right carotid artery: Secondary | ICD-10-CM | POA: Diagnosis present

## 2023-05-26 DIAGNOSIS — G936 Cerebral edema: Secondary | ICD-10-CM | POA: Diagnosis not present

## 2023-05-26 DIAGNOSIS — Z7982 Long term (current) use of aspirin: Secondary | ICD-10-CM

## 2023-05-26 DIAGNOSIS — R410 Disorientation, unspecified: Secondary | ICD-10-CM | POA: Diagnosis not present

## 2023-05-26 DIAGNOSIS — I680 Cerebral amyloid angiopathy: Secondary | ICD-10-CM | POA: Diagnosis present

## 2023-05-26 DIAGNOSIS — I6782 Cerebral ischemia: Secondary | ICD-10-CM | POA: Diagnosis not present

## 2023-05-26 DIAGNOSIS — I672 Cerebral atherosclerosis: Secondary | ICD-10-CM | POA: Diagnosis not present

## 2023-05-26 DIAGNOSIS — G9340 Encephalopathy, unspecified: Secondary | ICD-10-CM | POA: Diagnosis not present

## 2023-05-26 DIAGNOSIS — I63131 Cerebral infarction due to embolism of right carotid artery: Secondary | ICD-10-CM | POA: Diagnosis not present

## 2023-05-26 DIAGNOSIS — I639 Cerebral infarction, unspecified: Secondary | ICD-10-CM | POA: Diagnosis not present

## 2023-05-26 DIAGNOSIS — I619 Nontraumatic intracerebral hemorrhage, unspecified: Secondary | ICD-10-CM | POA: Diagnosis not present

## 2023-05-26 DIAGNOSIS — G9341 Metabolic encephalopathy: Secondary | ICD-10-CM | POA: Diagnosis not present

## 2023-05-26 DIAGNOSIS — E669 Obesity, unspecified: Secondary | ICD-10-CM | POA: Diagnosis present

## 2023-05-26 DIAGNOSIS — R4701 Aphasia: Secondary | ICD-10-CM | POA: Diagnosis present

## 2023-05-26 DIAGNOSIS — R404 Transient alteration of awareness: Secondary | ICD-10-CM | POA: Diagnosis not present

## 2023-05-26 DIAGNOSIS — H5347 Heteronymous bilateral field defects: Secondary | ICD-10-CM | POA: Diagnosis not present

## 2023-05-26 DIAGNOSIS — Z981 Arthrodesis status: Secondary | ICD-10-CM

## 2023-05-26 DIAGNOSIS — I629 Nontraumatic intracranial hemorrhage, unspecified: Secondary | ICD-10-CM | POA: Diagnosis not present

## 2023-05-26 DIAGNOSIS — M47812 Spondylosis without myelopathy or radiculopathy, cervical region: Secondary | ICD-10-CM | POA: Diagnosis present

## 2023-05-26 DIAGNOSIS — R0689 Other abnormalities of breathing: Secondary | ICD-10-CM | POA: Diagnosis not present

## 2023-05-26 DIAGNOSIS — G40901 Epilepsy, unspecified, not intractable, with status epilepticus: Secondary | ICD-10-CM | POA: Diagnosis not present

## 2023-05-26 DIAGNOSIS — Z515 Encounter for palliative care: Secondary | ICD-10-CM

## 2023-05-26 DIAGNOSIS — I6523 Occlusion and stenosis of bilateral carotid arteries: Secondary | ICD-10-CM | POA: Diagnosis not present

## 2023-05-26 DIAGNOSIS — G40101 Localization-related (focal) (partial) symptomatic epilepsy and epileptic syndromes with simple partial seizures, not intractable, with status epilepticus: Principal | ICD-10-CM | POA: Diagnosis present

## 2023-05-26 DIAGNOSIS — G8194 Hemiplegia, unspecified affecting left nondominant side: Secondary | ICD-10-CM | POA: Diagnosis present

## 2023-05-26 DIAGNOSIS — Z8673 Personal history of transient ischemic attack (TIA), and cerebral infarction without residual deficits: Secondary | ICD-10-CM

## 2023-05-26 DIAGNOSIS — E876 Hypokalemia: Secondary | ICD-10-CM | POA: Diagnosis not present

## 2023-05-26 DIAGNOSIS — R569 Unspecified convulsions: Secondary | ICD-10-CM

## 2023-05-26 DIAGNOSIS — R29721 NIHSS score 21: Secondary | ICD-10-CM | POA: Diagnosis not present

## 2023-05-26 DIAGNOSIS — I611 Nontraumatic intracerebral hemorrhage in hemisphere, cortical: Secondary | ICD-10-CM | POA: Diagnosis not present

## 2023-05-26 DIAGNOSIS — Z6835 Body mass index (BMI) 35.0-35.9, adult: Secondary | ICD-10-CM

## 2023-05-26 DIAGNOSIS — R55 Syncope and collapse: Secondary | ICD-10-CM | POA: Diagnosis not present

## 2023-05-26 DIAGNOSIS — Z79899 Other long term (current) drug therapy: Secondary | ICD-10-CM

## 2023-05-26 DIAGNOSIS — R9089 Other abnormal findings on diagnostic imaging of central nervous system: Secondary | ICD-10-CM | POA: Diagnosis not present

## 2023-05-26 DIAGNOSIS — I69398 Other sequelae of cerebral infarction: Secondary | ICD-10-CM | POA: Diagnosis not present

## 2023-05-26 DIAGNOSIS — R930 Abnormal findings on diagnostic imaging of skull and head, not elsewhere classified: Secondary | ICD-10-CM | POA: Diagnosis not present

## 2023-05-26 DIAGNOSIS — R9082 White matter disease, unspecified: Secondary | ICD-10-CM | POA: Diagnosis not present

## 2023-05-26 DIAGNOSIS — R0602 Shortness of breath: Secondary | ICD-10-CM | POA: Diagnosis not present

## 2023-05-26 DIAGNOSIS — Z66 Do not resuscitate: Secondary | ICD-10-CM | POA: Diagnosis not present

## 2023-05-26 DIAGNOSIS — H5500 Unspecified nystagmus: Secondary | ICD-10-CM | POA: Diagnosis present

## 2023-05-26 DIAGNOSIS — G9389 Other specified disorders of brain: Secondary | ICD-10-CM | POA: Diagnosis present

## 2023-05-26 DIAGNOSIS — M4802 Spinal stenosis, cervical region: Secondary | ICD-10-CM | POA: Diagnosis present

## 2023-05-26 DIAGNOSIS — Z87892 Personal history of anaphylaxis: Secondary | ICD-10-CM

## 2023-05-26 DIAGNOSIS — N400 Enlarged prostate without lower urinary tract symptoms: Secondary | ICD-10-CM | POA: Diagnosis present

## 2023-05-26 DIAGNOSIS — I6200 Nontraumatic subdural hemorrhage, unspecified: Secondary | ICD-10-CM | POA: Diagnosis not present

## 2023-05-26 DIAGNOSIS — R531 Weakness: Secondary | ICD-10-CM | POA: Diagnosis not present

## 2023-05-26 DIAGNOSIS — G935 Compression of brain: Secondary | ICD-10-CM | POA: Diagnosis not present

## 2023-05-26 DIAGNOSIS — G8929 Other chronic pain: Secondary | ICD-10-CM | POA: Diagnosis present

## 2023-05-26 DIAGNOSIS — I129 Hypertensive chronic kidney disease with stage 1 through stage 4 chronic kidney disease, or unspecified chronic kidney disease: Secondary | ICD-10-CM | POA: Diagnosis present

## 2023-05-26 DIAGNOSIS — I10 Essential (primary) hypertension: Secondary | ICD-10-CM | POA: Diagnosis not present

## 2023-05-26 DIAGNOSIS — Z885 Allergy status to narcotic agent status: Secondary | ICD-10-CM

## 2023-05-26 DIAGNOSIS — I771 Stricture of artery: Secondary | ICD-10-CM | POA: Diagnosis not present

## 2023-05-26 DIAGNOSIS — M109 Gout, unspecified: Secondary | ICD-10-CM | POA: Diagnosis present

## 2023-05-26 DIAGNOSIS — I951 Orthostatic hypotension: Secondary | ICD-10-CM | POA: Diagnosis not present

## 2023-05-26 DIAGNOSIS — R918 Other nonspecific abnormal finding of lung field: Secondary | ICD-10-CM | POA: Diagnosis not present

## 2023-05-26 DIAGNOSIS — E854 Organ-limited amyloidosis: Secondary | ICD-10-CM | POA: Diagnosis present

## 2023-05-26 DIAGNOSIS — I700 Atherosclerosis of aorta: Secondary | ICD-10-CM | POA: Diagnosis not present

## 2023-05-26 DIAGNOSIS — Z87442 Personal history of urinary calculi: Secondary | ICD-10-CM

## 2023-05-26 DIAGNOSIS — D649 Anemia, unspecified: Secondary | ICD-10-CM | POA: Diagnosis present

## 2023-05-26 DIAGNOSIS — N1831 Chronic kidney disease, stage 3a: Secondary | ICD-10-CM | POA: Diagnosis present

## 2023-05-26 DIAGNOSIS — E785 Hyperlipidemia, unspecified: Secondary | ICD-10-CM | POA: Diagnosis present

## 2023-05-26 DIAGNOSIS — I615 Nontraumatic intracerebral hemorrhage, intraventricular: Secondary | ICD-10-CM | POA: Diagnosis not present

## 2023-05-26 HISTORY — DX: Calculus of kidney: N20.0

## 2023-05-26 HISTORY — DX: Benign prostatic hyperplasia without lower urinary tract symptoms: N40.0

## 2023-05-26 HISTORY — DX: Gout, unspecified: M10.9

## 2023-05-26 HISTORY — DX: Essential (primary) hypertension: I10

## 2023-05-26 LAB — CBC
HCT: 38 % — ABNORMAL LOW (ref 39.0–52.0)
Hemoglobin: 12.9 g/dL — ABNORMAL LOW (ref 13.0–17.0)
MCH: 31.8 pg (ref 26.0–34.0)
MCHC: 33.9 g/dL (ref 30.0–36.0)
MCV: 93.6 fL (ref 80.0–100.0)
Platelets: 162 10*3/uL (ref 150–400)
RBC: 4.06 MIL/uL — ABNORMAL LOW (ref 4.22–5.81)
RDW: 14.3 % (ref 11.5–15.5)
WBC: 7.3 10*3/uL (ref 4.0–10.5)
nRBC: 0 % (ref 0.0–0.2)

## 2023-05-26 LAB — COMPREHENSIVE METABOLIC PANEL
ALT: 19 U/L (ref 0–44)
AST: 38 U/L (ref 15–41)
Albumin: 3.7 g/dL (ref 3.5–5.0)
Alkaline Phosphatase: 74 U/L (ref 38–126)
Anion gap: 13 (ref 5–15)
BUN: 25 mg/dL — ABNORMAL HIGH (ref 8–23)
CO2: 21 mmol/L — ABNORMAL LOW (ref 22–32)
Calcium: 9.2 mg/dL (ref 8.9–10.3)
Chloride: 104 mmol/L (ref 98–111)
Creatinine, Ser: 1.67 mg/dL — ABNORMAL HIGH (ref 0.61–1.24)
GFR, Estimated: 42 mL/min — ABNORMAL LOW (ref >60)
Glucose, Bld: 91 mg/dL (ref 70–99)
Potassium: 3.7 mmol/L (ref 3.5–5.1)
Sodium: 138 mmol/L (ref 135–145)
Total Bilirubin: 0.7 mg/dL (ref 0.0–1.2)
Total Protein: 6.2 g/dL — ABNORMAL LOW (ref 6.5–8.1)

## 2023-05-26 LAB — DIFFERENTIAL
Abs Immature Granulocytes: 0.02 10*3/uL (ref 0.00–0.07)
Basophils Absolute: 0.1 10*3/uL (ref 0.0–0.1)
Basophils Relative: 1 %
Eosinophils Absolute: 0.3 10*3/uL (ref 0.0–0.5)
Eosinophils Relative: 4 %
Immature Granulocytes: 0 %
Lymphocytes Relative: 29 %
Lymphs Abs: 2.1 10*3/uL (ref 0.7–4.0)
Monocytes Absolute: 0.6 10*3/uL (ref 0.1–1.0)
Monocytes Relative: 8 %
Neutro Abs: 4.3 10*3/uL (ref 1.7–7.7)
Neutrophils Relative %: 58 %

## 2023-05-26 LAB — URINALYSIS, ROUTINE W REFLEX MICROSCOPIC
Bilirubin Urine: NEGATIVE
Glucose, UA: 50 mg/dL — AB
Hgb urine dipstick: NEGATIVE
Ketones, ur: NEGATIVE mg/dL
Leukocytes,Ua: NEGATIVE
Nitrite: NEGATIVE
Protein, ur: NEGATIVE mg/dL
Specific Gravity, Urine: 1.02 (ref 1.005–1.030)
pH: 6 (ref 5.0–8.0)

## 2023-05-26 LAB — I-STAT CHEM 8, ED
BUN: 25 mg/dL — ABNORMAL HIGH (ref 8–23)
Calcium, Ion: 1.08 mmol/L — ABNORMAL LOW (ref 1.15–1.40)
Chloride: 104 mmol/L (ref 98–111)
Creatinine, Ser: 1.9 mg/dL — ABNORMAL HIGH (ref 0.61–1.24)
Glucose, Bld: 86 mg/dL (ref 70–99)
HCT: 39 % (ref 39.0–52.0)
Hemoglobin: 13.3 g/dL (ref 13.0–17.0)
Potassium: 3.7 mmol/L (ref 3.5–5.1)
Sodium: 137 mmol/L (ref 135–145)
TCO2: 22 mmol/L (ref 22–32)

## 2023-05-26 LAB — PROTIME-INR
INR: 1.1 (ref 0.8–1.2)
Prothrombin Time: 14.3 s (ref 11.4–15.2)

## 2023-05-26 LAB — CBG MONITORING, ED: Glucose-Capillary: 80 mg/dL (ref 70–99)

## 2023-05-26 LAB — ETHANOL: Alcohol, Ethyl (B): <10 mg/dL (ref <10)

## 2023-05-26 LAB — APTT: aPTT: 27 s (ref 24–36)

## 2023-05-26 MED ORDER — ADULT MULTIVITAMIN W/MINERALS CH
1.0000 | ORAL_TABLET | Freq: Every day | ORAL | Status: DC
  Administered 2023-05-27 – 2023-06-08 (×13): 1 via ORAL
  Filled 2023-05-26 (×14): qty 1

## 2023-05-26 MED ORDER — IOHEXOL 350 MG/ML SOLN
100.0000 mL | Freq: Once | INTRAVENOUS | Status: AC | PRN
  Administered 2023-05-26: 100 mL via INTRAVENOUS

## 2023-05-26 MED ORDER — AMLODIPINE BESYLATE 5 MG PO TABS
5.0000 mg | ORAL_TABLET | Freq: Every day | ORAL | Status: DC
  Administered 2023-05-27: 5 mg via ORAL
  Filled 2023-05-26: qty 1

## 2023-05-26 MED ORDER — ALBUTEROL SULFATE (2.5 MG/3ML) 0.083% IN NEBU
2.5000 mg | INHALATION_SOLUTION | RESPIRATORY_TRACT | Status: DC | PRN
  Filled 2023-05-26: qty 3

## 2023-05-26 MED ORDER — LEVETIRACETAM IN NACL 1500 MG/100ML IV SOLN
3000.0000 mg | Freq: Once | INTRAVENOUS | Status: AC
  Administered 2023-05-26: 3000 mg via INTRAVENOUS
  Filled 2023-05-26: qty 200

## 2023-05-26 MED ORDER — ALLOPURINOL 300 MG PO TABS
300.0000 mg | ORAL_TABLET | Freq: Every day | ORAL | Status: DC
  Administered 2023-05-27 – 2023-06-08 (×12): 300 mg via ORAL
  Filled 2023-05-26 (×14): qty 1

## 2023-05-26 MED ORDER — LEVETIRACETAM IN NACL 1000 MG/100ML IV SOLN
1000.0000 mg | Freq: Two times a day (BID) | INTRAVENOUS | Status: DC
  Administered 2023-05-27 – 2023-05-29 (×5): 1000 mg via INTRAVENOUS
  Filled 2023-05-26 (×5): qty 100

## 2023-05-26 MED ORDER — BACLOFEN 10 MG PO TABS: 10.0000 mg | ORAL_TABLET | Freq: Every day | ORAL | Status: DC | PRN

## 2023-05-26 MED ORDER — HYDRALAZINE HCL 20 MG/ML IJ SOLN
5.0000 mg | Freq: Four times a day (QID) | INTRAMUSCULAR | Status: DC | PRN
  Administered 2023-05-28 (×2): 5 mg via INTRAVENOUS
  Filled 2023-05-26 (×2): qty 1

## 2023-05-26 MED ORDER — ONDANSETRON HCL 4 MG/2ML IJ SOLN: 4.0000 mg | Freq: Four times a day (QID) | INTRAMUSCULAR | Status: DC | PRN

## 2023-05-26 MED ORDER — LEVETIRACETAM IN NACL 1500 MG/100ML IV SOLN
1500.0000 mg | Freq: Once | INTRAVENOUS | Status: AC
  Administered 2023-05-26: 1500 mg via INTRAVENOUS
  Filled 2023-05-26: qty 100

## 2023-05-26 MED ORDER — LORAZEPAM 2 MG/ML IJ SOLN
2.0000 mg | INTRAMUSCULAR | Status: AC
  Administered 2023-05-26: 2 mg via INTRAVENOUS
  Filled 2023-05-26: qty 1

## 2023-05-26 MED ORDER — SODIUM CHLORIDE 0.9% FLUSH
3.0000 mL | Freq: Once | INTRAVENOUS | Status: AC
  Administered 2023-05-26: 3 mL via INTRAVENOUS

## 2023-05-26 MED ORDER — ACETAMINOPHEN 325 MG PO TABS
650.0000 mg | ORAL_TABLET | Freq: Four times a day (QID) | ORAL | Status: DC | PRN
  Administered 2023-05-31 – 2023-06-09 (×6): 650 mg via ORAL
  Filled 2023-05-26 (×6): qty 2

## 2023-05-26 MED ORDER — LACTATED RINGERS IV SOLN: INTRAVENOUS | Status: DC

## 2023-05-26 MED ORDER — ONDANSETRON HCL 4 MG PO TABS: 4.0000 mg | ORAL_TABLET | Freq: Four times a day (QID) | ORAL | Status: DC | PRN

## 2023-05-26 MED ORDER — LORAZEPAM 2 MG/ML IJ SOLN
2.0000 mg | Freq: Once | INTRAMUSCULAR | Status: AC
  Administered 2023-05-26: 2 mg via INTRAVENOUS
  Filled 2023-05-26: qty 1

## 2023-05-26 MED ORDER — ATORVASTATIN CALCIUM 10 MG PO TABS
20.0000 mg | ORAL_TABLET | Freq: Every day | ORAL | Status: DC
  Administered 2023-05-27: 20 mg via ORAL
  Filled 2023-05-26: qty 2

## 2023-05-26 MED ORDER — HEPARIN SODIUM (PORCINE) 5000 UNIT/ML IJ SOLN
5000.0000 [IU] | Freq: Three times a day (TID) | INTRAMUSCULAR | Status: DC
  Administered 2023-05-26 – 2023-06-09 (×41): 5000 [IU] via SUBCUTANEOUS
  Filled 2023-05-26 (×41): qty 1

## 2023-05-26 MED ORDER — HYDROCODONE-ACETAMINOPHEN 5-325 MG PO TABS
1.0000 | ORAL_TABLET | ORAL | Status: DC | PRN
  Administered 2023-05-29: 1 via ORAL
  Filled 2023-05-26: qty 2
  Filled 2023-05-26: qty 1

## 2023-05-26 MED ORDER — ACETAMINOPHEN 650 MG RE SUPP: 650.0000 mg | Freq: Four times a day (QID) | RECTAL | Status: DC | PRN

## 2023-05-26 MED ORDER — HYDROCODONE-ACETAMINOPHEN 5-325 MG PO TABS
1.0000 | ORAL_TABLET | Freq: Once | ORAL | Status: AC
  Administered 2023-05-26: 1 via ORAL
  Filled 2023-05-26: qty 1

## 2023-05-26 MED ORDER — BISACODYL 5 MG PO TBEC: 5.0000 mg | DELAYED_RELEASE_TABLET | Freq: Every day | ORAL | Status: DC | PRN

## 2023-05-26 NOTE — Procedures (Addendum)
 [name]  MRN: [id number]  Epilepsy Attending: Charlsie Quest  Referring Physician/Provider: Rejeana Brock, MD  Date: 05/26/2023 Duration: 22.24 mins  Patient history: 78 yo M with syncopal episode at the kitchen table and fell around at 1420. Now with Left-sided gaze preference, left arm and leg weakness and hemianopia of the left side. Also developed numbness of the left leg. EEG to evaluate for seizure  Level of alertness:  lethargic/coma   AEDs during EEG study: LEV  Technical aspects: This EEG study was done with scalp electrodes positioned according to the 10-20 International system of electrode placement. Electrical activity was reviewed with band pass filter of 1-70Hz , sensitivity of 7 uV/mm, display speed of 27mm/sec with a 60Hz  notched filter applied as appropriate. EEG data were recorded continuously and digitally stored.  Video monitoring was available and reviewed as appropriate.  Description: EEG showed continuous generalized and maximal right parieto-occipital 3 to 6 Hz theta-delta slowing. Lateralized periodic discharges with overriding fast activity were noted in right hemisphere, maximal right parieto-occipital region at 1 to 1.5 Hz, at times with overlying rhythmicity and waxing and waning morphology. Hyperventilation and photic stimulation were not performed.     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  This EEG pattern is on the ictal-interictal continuum.  However, due to overlying fast activity, overlying rhythmicity as well as waxing and waning morphology, it is highly concerning for ictal nature.  Additionally there is cortical  dysfunction in right parieto-occipital region likely secondary to underlying encephalomalacia.  Lastly there is moderate diffuse encephalopathy.  If concern for ictal-interictal activity persist, consider long-term EEG  Dr. Amada Jupiter was notified  Falicity Sheets Annabelle Harman

## 2023-05-26 NOTE — ED Triage Notes (Signed)
 PT BIB [facility] from home after PT experienced a syncopal episode at the kitchen table and fell around 1420 with S/S of L sided gaze preference, L arm and leg weakness and hemianopia on L side. PT  Aox1, alert to self. Family denies witnessing injury to head when falling. AEMS BP 136/66. LKW 1045 when daughter states PT expierienced numbness in L leg.

## 2023-05-26 NOTE — ED Provider Notes (Signed)
 [place] EMERGENCY DEPARTMENT AT [facility] Provider Note   CSN: [id number] Arrival date & time: 05/26/23  4098  An emergency department physician performed an initial assessment on this suspected stroke patient at 1510.  History  Chief Complaint  Patient presents with   Code Stroke    Danny Patterson is a 78 y.o. male.  78 year old presenting to the emergency department today with left-sided weakness.  The patient apparently started with some left-sided numbness that started about an hour prior to arrival.  He did fall afterwards.  When medics arrived the patient had left-sided hemiparesis.  He is brought to the ER at that time for further evaluation.  The patient is not on any blood thinners.  He is unable to provide any further history.  He is aphasic.        Home Medications Prior to Admission medications   Medication Sig Start Date End Date Taking? Authorizing Provider  allopurinol (ZYLOPRIM) 300 MG tablet Take 300 mg by mouth daily. 12/27/22  Yes [provider]  ALPHA LIPOIC ACID PO Take 1 tablet by mouth daily.   Yes [provider]  amLODipine (NORVASC) 5 MG tablet Take 5 mg by mouth daily. 12/06/22  Yes [provider]  atorvastatin (LIPITOR) 20 MG tablet Take 20 mg by mouth daily. 05/14/23  Yes [provider]  baclofen (LIORESAL) 10 MG tablet Take 10 mg by mouth daily as needed for muscle spasms.   Yes [provider]  colchicine 0.6 MG tablet Take 0.6 mg by mouth daily as needed (for gout).   Yes [provider]  furosemide (LASIX) 20 MG tablet Take 20 mg by mouth daily as needed for fluid. 12/15/22  Yes [provider]  Multiple Vitamin (MULTIVITAMIN WITH MINERALS) TABS tablet Take 1 tablet by mouth daily.   Yes [provider]  Potassium 99 MG TABS Take by mouth.   Yes [provider]  potassium citrate (UROCIT-K) 10 MEQ (1080 MG) SR tablet Take 10 mEq by mouth daily.   Yes  [provider]      Allergies    Ace inhibitors and Hydrocodone    Review of Systems   Review of Systems  Reason unable to perform ROS: Current mental status.    Physical Exam Updated Vital Signs BP (!) 148/84 (BP Location: Right Arm)   Pulse 79   Temp 98.3 F (36.8 C) (Oral)   Resp 14   Ht 5\' 10"  (1.778 m)   Wt 107.5 kg   SpO2 97%   BMI 34.01 kg/m  Physical Exam Vitals and nursing note reviewed.   Gen: Left-sided gaze preference noted Eyes: PERRL,, left going nystagmus noted HEENT: no oropharyngeal swelling Neck: trachea midline Resp: clear to auscultation bilaterally Card: RRR, no murmurs, rubs, or gallops Abd: nontender, nondistended Extremities: no calf tenderness, no edema Vascular: 2+ radial pulses bilaterally, 2+ DP pulses bilaterally Neuro: Left-sided hemiparesis noted, the patient will move his right upper and lower extremity to painful stimuli, please see NIH stroke scale from stroke team who evaluated the patient with me on initial arrival for NIH stroke scale score Skin: no rashes   ED Results / Procedures / Treatments   Labs (all labs ordered are listed, but only abnormal results are displayed) Labs Reviewed  CBC - Abnormal; Notable for the following components:      Result Value   RBC 4.06 (*)    Hemoglobin 12.9 (*)    HCT 38.0 (*)  All other components within normal limits  COMPREHENSIVE METABOLIC PANEL - Abnormal; Notable for the following components:   CO2 21 (*)    BUN 25 (*)    Creatinine, Ser 1.67 (*)    Total Protein 6.2 (*)    GFR, Estimated 42 (*)    All other components within normal limits  I-STAT CHEM 8, ED - Abnormal; Notable for the following components:   BUN 25 (*)    Creatinine, Ser 1.90 (*)    Calcium, Ion 1.08 (*)    All other components within normal limits  PROTIME-INR  APTT  DIFFERENTIAL  ETHANOL  URINALYSIS, ROUTINE W REFLEX MICROSCOPIC  CBG MONITORING, ED    EKG None  Radiology EEG adult Result  Date: 05/26/2023 Charlsie Quest, MD     05/26/2023  6:49 PM [name] MRN: [id number] Epilepsy Attending: Charlsie Patterson Referring Physician/Provider: Rejeana Brock, MD Date: 05/26/2023 Duration: 22.24 mins Patient history: 78 yo M with syncopal episode at the kitchen table and fell around at 1420. Now with Left-sided gaze preference, left arm and leg weakness and hemianopia of the left side. Also developed numbness of the left leg. EEG to evaluate for seizure Level of alertness:  lethargic/coma AEDs during EEG study: LEV Technical aspects: This EEG study was done with scalp electrodes positioned according to the 10-20 International system of electrode placement. Electrical activity was reviewed with band pass filter of 1-70Hz , sensitivity of 7 uV/mm, display speed of 50mm/sec with a 60Hz  notched filter applied as appropriate. EEG data were recorded continuously and digitally stored.  Video monitoring was available and reviewed as appropriate. Description: EEG showed continuous generalized and maximal right parieto-occipital 3 to 6 Hz theta-delta slowing. Lateralized periodic discharges with overriding fast activity were noted in right hemisphere, maximal right parieto-occipital region at 1 to 1.5 Hz, at times with overlying rhythmicity and waxing and waning morphology. Hyperventilation and photic stimulation were not performed.   ABNORMALITY -Lateralized periodic discharges with overriding fast activity and rhythmicity, right hemisphere, maximal right parieto-occipital region -Continuous slow, generalized and maximal right parieto-occipital region IMPRESSION: This study showed evidence of epileptogenicity arising from right hemisphere, maximal right parieto-occipital region.  This EEG pattern is on the ictal-interictal continuum.  However, due to overlying fast activity, overlying rhythmicity as well as waxing and waning morphology, it is highly concerning for ictal nature. Additionally  there is cortical dysfunction in right parieto-occipital region likely secondary to underlying encephalomalacia.  Lastly there is moderate diffuse encephalopathy. If concern for ictal-interictal activity persist, consider long-term EEG Dr. Amada Jupiter was notified Charlsie Patterson   CT ANGIO HEAD NECK W WO CM W PERF (CODE STROKE) Result Date: 05/26/2023 CLINICAL DATA:  Code stroke. Left-sided paralysis. Last known well at 11 o'clock a.m. EXAM: CT ANGIOGRAPHY HEAD AND NECK CT PERFUSION BRAIN TECHNIQUE: Multidetector CT imaging of the head and neck was performed using the standard protocol during bolus administration of intravenous contrast. Multiplanar CT image reconstructions and MIPs were obtained to evaluate the vascular anatomy. Carotid stenosis measurements (when applicable) are obtained utilizing NASCET criteria, using the distal internal carotid diameter as the denominator. Multiphase CT imaging of the brain was performed following IV bolus contrast injection. Subsequent parametric perfusion maps were calculated using RAPID software. RADIATION DOSE REDUCTION: This exam was performed according to the departmental dose-optimization program which includes automated exposure control, adjustment of the mA and/or kV according to patient size and/or use of iterative reconstruction technique. CONTRAST:  OMNIPAQUE IOHEXOL 350 MG/ML  SOLN COMPARISON:  100 mL Omnipaque 350 scratched at CT head without contrast 05/26/2023 at 3:20 p.m. FINDINGS: CTA NECK FINDINGS Aortic arch: A 3 vessel arch configuration is present. Minimal atherosclerotic calcifications are present in the distal arch. The great vessel origins are within normal limits. No focal stenosis or aneurysm is present. No dissection is present. Right carotid system: The right common carotid artery is within normal limits. Minimal calcifications are present in the proximal right external carotid artery and slightly more distal right internal carotid artery  without significant stenosis. Cervical right ICA is otherwise normal. Left carotid system: The left common carotid artery is within normal limits. Minimal calcifications are present at the left carotid bifurcation without significant stenosis. The cervical left ICA is otherwise normal. Vertebral arteries: The vertebral arteries are codominant. Both vertebral arteries originate from the subclavian arteries without significant stenosis. No significant stenosis is present in either vertebral artery in neck. Skeleton: Moderate degenerative changes are present in the cervical spine. Facet hypertrophy contributes to moderate right foraminal stenosis at C3-4 uncovertebral spurring contributes to moderate foraminal stenosis bilaterally at C6-7. Other neck: The soft tissues of the neck are otherwise unremarkable. Salivary glands are within normal limits. Thyroid is normal. No significant adenopathy is present. No focal mucosal or submucosal lesions are present. Upper chest: The lung apices are clear. The thoracic inlet is within normal limits. Review of the MIP images confirms the above findings CTA HEAD FINDINGS Anterior circulation: Atherosclerotic calcifications are present within the cavernous internal carotid arteries without a significant stenosis through the ICA termini. The left A1 segment is dominant. The anterior communicating artery is patent. The right M1 segment is normal. Moderate stenosis is present in mid left M1 segment. MCA bifurcations are intact bilaterally. Moderate attenuation of distal ACA and MCA branch vessels present without a significant focal stenosis or occlusion with in the proximal circle-of-Willis. No aneurysm is present. Posterior circulation: The vertebral arteries are codominant. Left PICA origin is visualized and normal. The vertebrobasilar junction basilar artery normal. Both posterior cerebral arteries originate basilar tip. Moderate segmental stenoses are present in the left P2 segment.  The PCA branch vessels are otherwise within normal limits. No aneurysm is present. Venous sinuses: The dural sinuses are patent. The straight sinus and deep cerebral veins are intact. Cortical veins are within normal limits. No significant vascular malformation is evident. Anatomic variants: None Review of the MIP images confirms the above findings CT Brain Perfusion Findings: ASPECTS: 10/10 CBF (<30%) Volume: 0mL Perfusion (Tmax>6.0s) volume: 0mL Mismatch Volume: 0mL IMPRESSION: 1. No large vessel occlusion. 2. Moderate stenosis in the mid left M1 segment. 3. Moderate segmental stenoses in the left P2 segment. 4. Moderate attenuation of distal ACA and MCA branch vessels without a significant focal stenosis or occlusion. 5. Minimal atherosclerotic changes at the carotid bifurcations and cavernous internal carotid arteries without significant stenosis. 6. Moderate degenerative changes in the cervical spine. 7. Moderate right foraminal stenosis at C3-4 and moderate foraminal stenosis bilaterally at C6-7. 8. CT perfusion is negative. The above was relayed via text pager to Dr. Amada Jupiter on 05/26/2023 at 15:50 . Electronically Signed   By: Marin Roberts M.D.   On: 05/26/2023 15:52   CT HEAD CODE STROKE WO CONTRAST Result Date: 05/26/2023 CLINICAL DATA:  Code stroke.  Left-sided paralysis. EXAM: CT HEAD WITHOUT CONTRAST TECHNIQUE: Contiguous axial images were obtained from the base of the skull through the vertex without intravenous contrast. RADIATION DOSE REDUCTION: This exam was performed according to the departmental  dose-optimization program which includes automated exposure control, adjustment of the mA and/or kV according to patient size and/or use of iterative reconstruction technique. COMPARISON:  None available FINDINGS: Brain: Encephalomalacia involving the posterior and medial right parietal lobe appears remote. Moderate generalized atrophy and white matter disease is present bilaterally. No acute  infarct, hemorrhage or mass lesion is present. Deep brain nuclei are within normal limits. The ventricles are of normal size. No significant extraaxial fluid collection is present. The brainstem and cerebellum are within normal limits. Midline structures are within normal limits. Vascular: Atherosclerotic calcifications are present within the cavernous internal carotid arteries bilaterally and at the dural margin of the left vertebral artery. No hyperdense vessel is present. Skull: Calvarium is intact. No focal lytic or blastic lesions are present. No significant extracranial soft tissue lesion is present. Advanced degenerative changes are present in the right TMJ. Sinuses/Orbits: The paranasal sinuses and mastoid air cells are clear. The globes and orbits are within normal limits. ASPECTS [facility] Stroke Program Early CT Score) - Ganglionic level infarction (caudate, lentiform nuclei, internal capsule, insula, M1-M3 cortex): 7/7 - Supraganglionic infarction (M4-M6 cortex): 3/3 Total score (0-10 with 10 being normal): 10/10 IMPRESSION: 1. No acute intracranial abnormality or significant interval change. 2. Encephalomalacia involving the posterior and medial right parietal lobe appears remote. 3. Moderate generalized atrophy and white matter disease likely reflects the sequela of chronic microvascular ischemia. 4. Aspects is 10/10. 5. Advanced degenerative changes in the right TMJ. The above was relayed via text pager to Dr. Amada Jupiter on 05/26/2023 at 15:28 . Electronically Signed   By: Marin Roberts M.D.   On: 05/26/2023 15:31    Procedures Procedures    Medications Ordered in ED Medications  lactated ringers infusion ( Intravenous New Bag/Given 05/26/23 1849)  levETIRAcetam (KEPPRA) IVPB 1000 mg/100 mL premix (has no administration in time range)  LORazepam (ATIVAN) injection 2 mg (has no administration in time range)  sodium chloride flush (NS) 0.9 % injection 3 mL (3 mLs Intravenous Given 05/26/23  1656)  levETIRAcetam (KEPPRA) IVPB 1500 mg/ 100 mL premix (0 mg Intravenous Stopped 05/26/23 1645)  iohexol (OMNIPAQUE) 350 MG/ML injection 100 mL (100 mLs Intravenous Contrast Given 05/26/23 1538)  HYDROcodone-acetaminophen (NORCO/VICODIN) 5-325 MG per tablet 1 tablet (1 tablet Oral Given 05/26/23 1850)  levETIRAcetam (KEPPRA) IVPB 1500 mg/ 100 mL premix (0 mg Intravenous Stopped 05/26/23 2015)  LORazepam (ATIVAN) injection 2 mg (2 mg Intravenous Given 05/26/23 1939)    ED Course/ Medical Decision Making/ A&P                                 Medical Decision Making 78 year old male presenting to the emergency department today with symptoms concerning for CVA versus seizure.  Will go forward with CT angiogram and CT scan/perfusion study to evaluate for stroke.  If this is negative will need EEG.  Currently, the patient is hemodynamically stable and maintaining his airway.  The patient CT imaging is negative.  The patient was loaded with Keppra by Dr. Amada Jupiter.  He did have improvement in his symptoms.  Given the duration of his symptoms Dr. Amada Jupiter recommends admission to the hospital for further evaluation.  Calls placed to hospital service for admission.  The patient's labs are reassuring.  CRITICAL CARE Performed by: Durwin Glaze   Total critical care time: 40 minutes  Critical care time was exclusive of separately billable procedures and treating other patients.  Critical  care was necessary to treat or prevent imminent or life-threatening deterioration.  Critical care was time spent personally by me on the following activities: development of treatment plan with patient and/or surrogate as well as nursing, discussions with consultants, evaluation of patient's response to treatment, examination of patient, obtaining history from patient or surrogate, ordering and performing treatments and interventions, ordering and review of laboratory studies, ordering and review of radiographic studies,  pulse oximetry and re-evaluation of patient's condition.   Amount and/or Complexity of Data Reviewed Labs: ordered. Radiology: ordered.  Risk Prescription drug management. Decision regarding hospitalization.           Final Clinical Impression(s) / ED Diagnoses Final diagnoses:  Status epilepticus [facility])    Rx / DC Orders ED Discharge Orders     None         Durwin Glaze, MD 05/26/23 2049

## 2023-05-26 NOTE — ED Notes (Signed)
 Rounding on patient. Family reports patient is increasingly restless. Patient continues to remove blanket that is no longer on him. Continues to reach for objects that are not in room. Family reports this occurred after administration of NORCO. Provider notified. Medication listed on allergy.

## 2023-05-26 NOTE — Progress Notes (Signed)
 Patient will be moving to inpatient room, will contact 5w nurse for availability for lead placement

## 2023-05-26 NOTE — Code Documentation (Signed)
 Danny Patterson is a 78 yr old male with unknown PMH arriving to [facility] via  EMS on 05/26/2023. Pt is coming from home where he was LKW today at 1000, and is now c/o left weakness and Lt gaze. Pt is not on any known thinners.    Pt met at bridge by Stroke Team. Labs, CBG obtained, airway cleared by EDP. Pt to CT with team. NIHSS 21. Pt with Left motor weakness, left gaze, aphasia. Please see documentation for NIHSS details and timeline. The following imaging was obtained: CT, CTA/P. Per Dr Amada Jupiter, CT was negative for hemorrhage, and CTA neg for LVO or perfusion abnormality.    Pt returned to ED room 27 where his workup will continue. He will need q 2 hr NIHSS and VS. He is OOW for TNK, and ineligible for NIR as he is LVO negative. Bedside handoff with ED RN complete.

## 2023-05-26 NOTE — ED Notes (Signed)
 Patient transported to admitting floor with family. All belongings sent with patient. Family without questions at this time.

## 2023-05-26 NOTE — Consult Note (Signed)
 NEUROLOGY CONSULT NOTE   Date of service: May 26, 2023 [name] MRN:  [id number] DOB:  Nov 24, 1945 Chief Complaint: "code stroke" Requesting Provider: Beckey Downing  History of Present Illness  Danny Patterson is a 78 y.o. male with no known medical history of stroke, both two previous episodes of syncope who presents with status epilepticus.  He was in his normal state of health this morning, then around 10 AM the patient's daughter spoke with him and he seemed completely normal.  Around 29, he started complaining of numbness of his left leg, though it is unclear if he then experienced and was prior.  Shortly before calling nine one, however, the symptoms progressed and the patient was having difficulty using his left side and then fell to the floor and convulsed.  The convulsions lasted for approximately 5 minutes following which the patient was nonverbal and EMS was called.  On arrival to the emergency department, he was moving his right side purposefully, but had a leftward gaze with nystagmus and complete left hemiplegia.  He was also nonverbal.  He was taken for an emergent CT. at this point he was outside the IV TNK window, so CTA/CTP was performed.  LKW: 10 am IV Thrombolysis: No, outside of IV TNK window EVT: No, no LVO Nihss: 21    Past History  History reviewed. No pertinent past medical history.    Family History: History reviewed. No pertinent family history.  Social History  has no history on file for tobacco use, alcohol use, and drug use.  Allergies  Allergen Reactions   Ace Inhibitors Anaphylaxis    Mouth swelling    Hydrocodone Other (See Comments)    Patient began reaching for objects that are not there and restlessness    Medications   Current Facility-Administered Medications:    lactated ringers infusion, , Intravenous, Continuous, Regalado, Belkys A, MD, Last Rate: 100 mL/hr at 05/26/23 1849, New Bag at 05/26/23 1849   [START ON 05/27/2023]  levETIRAcetam (KEPPRA) IVPB 1000 mg/100 mL premix, 1,000 mg, Intravenous, Q12H, Jaevian Shean, Hardin Negus, MD   LORazepam (ATIVAN) injection 2 mg, 2 mg, Intravenous, Once, Caryl Pina, MD  Current Outpatient Medications:    allopurinol (ZYLOPRIM) 300 MG tablet, Take 300 mg by mouth daily., Disp: , Rfl:    ALPHA LIPOIC ACID PO, Take 1 tablet by mouth daily., Disp: , Rfl:    amLODipine (NORVASC) 5 MG tablet, Take 5 mg by mouth daily., Disp: , Rfl:    atorvastatin (LIPITOR) 20 MG tablet, Take 20 mg by mouth daily., Disp: , Rfl:    baclofen (LIORESAL) 10 MG tablet, Take 10 mg by mouth daily as needed for muscle spasms., Disp: , Rfl:    colchicine 0.6 MG tablet, Take 0.6 mg by mouth daily as needed (for gout)., Disp: , Rfl:    furosemide (LASIX) 20 MG tablet, Take 20 mg by mouth daily as needed for fluid., Disp: , Rfl:    Multiple Vitamin (MULTIVITAMIN WITH MINERALS) TABS tablet, Take 1 tablet by mouth daily., Disp: , Rfl:    Potassium 99 MG TABS, Take by mouth., Disp: , Rfl:    potassium citrate (UROCIT-K) 10 MEQ (1080 MG) SR tablet, Take 10 mEq by mouth daily., Disp: , Rfl:   Vitals   Vitals:   05/26/23 1500 05/26/23 1530 05/26/23 1608 05/26/23 1800  BP:  (!) 154/48  (!) 148/84  Pulse:  78  79  Resp:  15  14  Temp:  97.7 F (36.5  C)  98.3 F (36.8 C)  TempSrc:  Oral  Oral  SpO2:  95%  97%  Weight: 107.2 kg  107.5 kg   Height:   5\' 10"  (1.778 m)     Body mass index is 34.01 kg/m.  Physical Exam   Constitutional: Appears well-developed and well-nourished.  Neurologic Examination    Neuro: Mental Status: Patient has eyes open, but does not engage with the examiner in any meaningful way.  He has persistent gaze deviation and does not follow commands. Cranial Nerves: II: He blinks to threat from the right but not left pupils are equal, round, and reactive to light.   III,IV, VI: Left gaze deviation with nystagmus  Motor: He is plegic on the left side with no movement, he moves  both the right arm and leg voluntarily sensory: He withdraws to noxious stimulation on the right, minimal flexion on the left with grimace Cerebellar:  He does not perform        Labs/Imaging/Neurodiagnostic studies   CBC:  Recent Labs  Lab Jun 11, 2023 1511 06/11/2023 1515  WBC 7.3  --   NEUTROABS 4.3  --   HGB 12.9* 13.3  HCT 38.0* 39.0  MCV 93.6  --   PLT 162  --    Basic Metabolic Panel:  Lab Results  Component Value Date   NA 137 June 11, 2023   K 3.7 06-11-23   CO2 21 (L) 2023/06/11   GLUCOSE 86 2023-06-11   BUN 25 (H) 06-11-2023   CREATININE 1.90 (H) 2023-06-11   CALCIUM 9.2 06-11-23   GFRNONAA 42 (L) Jun 11, 2023   Lipid Panel: No results found for: "LDLCALC" HgbA1c: No results found for: "HGBA1C" Urine Drug Screen: No results found for: "LABOPIA", "COCAINSCRNUR", "LABBENZ", "AMPHETMU", "THCU", "LABBARB"  Alcohol Level     Component Value Date/Time   ETH <10 Jun 11, 2023 1511   INR  Lab Results  Component Value Date   INR 1.1 June 11, 2023   APTT  Lab Results  Component Value Date   APTT 27 06-11-2023    CT Head without contrast(Personally reviewed): Old right sudden strokes  CT angio Head and Neck with contrast(Personally reviewed): No LVO    ASSESSMENT   Danny Patterson is a 78 y.o. male who presents with new onset focal status epilepticus.  He was loaded with Keppra and appear to be improving, however his EEG continues to show a pattern of high concern and therefore I would favor giving him an additional treatment with completing 4.5 g load of IV Keppra and 2 mg of Ativan.  I would favor getting continuous EEG monitoring as well as MRI also.  RECOMMENDATIONS  MRI brain Keppra additional 1.5 g now for total 4.5 g followed by 1 g twice daily Ativan 2 mg x 1 Continuous EEG Neurology will follow   ______________________________________________________________________  This patient is critically ill and at significant risk of neurological  worsening, death and care requires constant monitoring of vital signs, hemodynamics,respiratory and cardiac monitoring, neurological assessment, discussion with family, other specialists and medical decision making of high complexity. I spent 50 minutes of neurocritical care time  in the care of  this patient. This was time spent independent of any time provided by nurse practitioner or PA.  Ritta Slot, MD [facility]   If 7pm- 7am, please page neurology on call as listed in AMION. June 11, 2023  8:51 PM

## 2023-05-26 NOTE — Progress Notes (Signed)
 Per nurse patient is not available for EEG placement at this time , agitated. Tech will try back as schedule allows.

## 2023-05-26 NOTE — H&P (Addendum)
 History and Physical    Patient: Danny Patterson:[id number] DOB: 09-05-1945 DOA: 05/26/2023 DOS: the patient was seen and examined on 05/26/2023 PCP: Danny Cords, DO  Patient coming from: Home  Chief Complaint:  Chief Complaint  Patient presents with   Code Stroke   HPI: Danny Patterson is a 78 y.o. male with medical history significant of HTN, stable BPH, could not tolerate Flomax, gout, lumbar fusion, history of kidney stone presents from home Patient brought from home by EMS after he had a syncopal episode at the kitchen table and fell around at 1420 with stroke signs: Left-sided gaze preference, left arm and leg weakness and hemianopia of the left side.  Also developed numbness of the left leg.  He was brought as a code stroke.   Per family patient started to complain this morning of left thigh numbness and tingling, subsequently while he was sitting on the chair he fell down on the floor, he was still alert.  When family helped him up, he fell back they hold him, his eyes rolled back, he was shaking, he is breathing change, he made some noise.  No urinary or bladder incontinence. Patient bite his tongue.   Patient has had 2 previous similar episode in January and in November 2024 where he was not feeling well he passed out his eyes rolled back at that time he was diagnosed with syncope he was evaluated at [facility].  Family also report, patient has issues with urination, also foul-smelling urine  -CT head no acute intracranial abnormality.  Encephalomalacia involving the posterior and medial right parietal lobe appears remote.  Moderate generalized atrophy and white matter disease likely reflecting sequela of chronic microvascular ischemia. -CT angio head: No large vessel occlusion moderate stenosis mid left M1 1 segment, moderate segment stenosis left P2 segment.  Minimal atherosclerotic changes at the carotid bifurcation and cavernous internal carotid arteries.  Moderate  right foraminal stenosis at C3-C4 and moderate foraminal stenosis C6 6 7 Labs: Sodium 138, potassium 3.7, chloride 104, CO2 21, creatinine 1.6, hemoglobin 12, blood cell 7.3 EKG: Sinus rhythm right bundle branch block.  No prior EKG  Review of Systems: As mentioned in the history of present illness. All other systems reviewed and are negative.  Social History:  has no history on file for tobacco use, alcohol use, and drug use.  Not on File  PMH: Hypertension           History of kidney stone           CKD, unknown stage            Gout  Prior to Admission medications   Medication Sig Start Date End Date Taking? Authorizing Provider  allopurinol (ZYLOPRIM) 300 MG tablet Take 300 mg by mouth daily. 12/27/22  Yes [provider]  amLODipine (NORVASC) 5 MG tablet Take 5 mg by mouth daily. 12/06/22  Yes [provider]  atorvastatin (LIPITOR) 20 MG tablet Take 20 mg by mouth daily. 05/14/23  Yes [provider]  furosemide (LASIX) 20 MG tablet Take 20-40 mg by mouth daily as needed. 12/15/22  Yes [provider]    Physical Exam: Vitals:   05/26/23 1500 05/26/23 1530 05/26/23 1608  BP:  (!) 154/48   Pulse:  78   Resp:  15   Temp:  97.7 F (36.5 C)   TempSrc:  Oral   SpO2:  95%   Weight: 107.2 kg  107.5 kg  Height:   5\' 10"  (  1.778 m)   General: Patient alert, hard of hearing, following commands Ear nose and throat: He had small hematoma under his tongue,  Cardiovascular: S1-S2 regular rhythm or rate Lungs: Normal respiratory effort: Clear to auscultation Abdomen: Bowel sounds present, soft nontender nondistended no rigidity obese abdomen Extremity: No edema Neuroexam: He is alert and conversant little bit confused, follows commands able to move bilateral upper and lower extremity, left side weakness has significantly improved.  Also vision changes  improved slightly   Data Reviewed:  Las reviewed.   Assessment and Plan: No notes have been  filed under this hospital service. Service: Hospitalist  1-Seizures; Patient presents with what appears to be seizure episode, initially started with left side numbness tingling and weakness subsequently he passed out, eyes rolled back, started shaking, he bite his tongue, his breathing pattern change and he made some noises. -He presented as a code stroke but after evaluation symptoms likely related to seizures. -CT head no acute intracranial abnormality, encephalomalacia -CT angio head and Neck; no large Vessel occlusion.  -He was loaded with IV keppra.  -EEG has been order.  Neurology recommend Keppra 1 gr BID.   2-CKD stage IIIa;  Chart Marked for merged, record review shoed Cr baseline 1.3--1.9 Monitor urine out put Bladder scan.  IV fluids  3-HLD; resume crestor.  4-HTN: Resume Norvasc.  5-HO gout: resume allopurinol. Hold colchicine.  6-Urinary problems; Check UA>  7-Known RBBB, reviewed Marked chart for merge    Advance Care Planning:   Code Status: Not on file family report patient is full code  Consults: neurology   Family Communication: Daughter and wife at bedside.   Severity of Illness: The appropriate patient status for this patient is OBSERVATION. Observation status is judged to be reasonable and necessary in order to provide the required intensity of service to ensure the patient's safety. The patient's presenting symptoms, physical exam findings, and initial radiographic and laboratory data in the context of their medical condition is felt to place them at decreased risk for further clinical deterioration. Furthermore, it is anticipated that the patient will be medically stable for discharge from the hospital within 2 midnights of admission.   Author: Alba Cory, MD 05/26/2023 5:55 PM  For on call review [email or web address].

## 2023-05-26 NOTE — Progress Notes (Signed)
STAT EEG complete. Results pending °

## 2023-05-27 DIAGNOSIS — I129 Hypertensive chronic kidney disease with stage 1 through stage 4 chronic kidney disease, or unspecified chronic kidney disease: Secondary | ICD-10-CM | POA: Diagnosis not present

## 2023-05-27 DIAGNOSIS — N1831 Chronic kidney disease, stage 3a: Secondary | ICD-10-CM | POA: Diagnosis not present

## 2023-05-27 DIAGNOSIS — I6521 Occlusion and stenosis of right carotid artery: Secondary | ICD-10-CM | POA: Diagnosis not present

## 2023-05-27 DIAGNOSIS — I611 Nontraumatic intracerebral hemorrhage in hemisphere, cortical: Secondary | ICD-10-CM | POA: Diagnosis not present

## 2023-05-27 DIAGNOSIS — E785 Hyperlipidemia, unspecified: Secondary | ICD-10-CM | POA: Diagnosis not present

## 2023-05-27 DIAGNOSIS — R29721 NIHSS score 21: Secondary | ICD-10-CM | POA: Diagnosis not present

## 2023-05-27 DIAGNOSIS — G9389 Other specified disorders of brain: Secondary | ICD-10-CM | POA: Diagnosis not present

## 2023-05-27 DIAGNOSIS — E876 Hypokalemia: Secondary | ICD-10-CM | POA: Diagnosis not present

## 2023-05-27 DIAGNOSIS — Z6835 Body mass index (BMI) 35.0-35.9, adult: Secondary | ICD-10-CM | POA: Diagnosis not present

## 2023-05-27 DIAGNOSIS — G9341 Metabolic encephalopathy: Secondary | ICD-10-CM | POA: Diagnosis not present

## 2023-05-27 DIAGNOSIS — R569 Unspecified convulsions: Secondary | ICD-10-CM | POA: Diagnosis not present

## 2023-05-27 DIAGNOSIS — R410 Disorientation, unspecified: Secondary | ICD-10-CM | POA: Diagnosis not present

## 2023-05-27 DIAGNOSIS — R404 Transient alteration of awareness: Secondary | ICD-10-CM | POA: Diagnosis not present

## 2023-05-27 DIAGNOSIS — D649 Anemia, unspecified: Secondary | ICD-10-CM | POA: Diagnosis not present

## 2023-05-27 DIAGNOSIS — I619 Nontraumatic intracerebral hemorrhage, unspecified: Secondary | ICD-10-CM | POA: Diagnosis not present

## 2023-05-27 DIAGNOSIS — G8194 Hemiplegia, unspecified affecting left nondominant side: Secondary | ICD-10-CM | POA: Diagnosis not present

## 2023-05-27 DIAGNOSIS — I63421 Cerebral infarction due to embolism of right anterior cerebral artery: Secondary | ICD-10-CM | POA: Diagnosis not present

## 2023-05-27 DIAGNOSIS — I629 Nontraumatic intracranial hemorrhage, unspecified: Secondary | ICD-10-CM | POA: Diagnosis not present

## 2023-05-27 DIAGNOSIS — Z66 Do not resuscitate: Secondary | ICD-10-CM | POA: Diagnosis not present

## 2023-05-27 DIAGNOSIS — I680 Cerebral amyloid angiopathy: Secondary | ICD-10-CM | POA: Diagnosis not present

## 2023-05-27 DIAGNOSIS — Z515 Encounter for palliative care: Secondary | ICD-10-CM | POA: Diagnosis not present

## 2023-05-27 DIAGNOSIS — G9340 Encephalopathy, unspecified: Secondary | ICD-10-CM | POA: Diagnosis not present

## 2023-05-27 DIAGNOSIS — I4510 Unspecified right bundle-branch block: Secondary | ICD-10-CM | POA: Diagnosis not present

## 2023-05-27 DIAGNOSIS — I69398 Other sequelae of cerebral infarction: Secondary | ICD-10-CM | POA: Diagnosis not present

## 2023-05-27 DIAGNOSIS — I10 Essential (primary) hypertension: Secondary | ICD-10-CM | POA: Diagnosis not present

## 2023-05-27 DIAGNOSIS — I63131 Cerebral infarction due to embolism of right carotid artery: Secondary | ICD-10-CM | POA: Diagnosis not present

## 2023-05-27 DIAGNOSIS — G935 Compression of brain: Secondary | ICD-10-CM | POA: Diagnosis not present

## 2023-05-27 DIAGNOSIS — N179 Acute kidney failure, unspecified: Secondary | ICD-10-CM | POA: Diagnosis not present

## 2023-05-27 DIAGNOSIS — I6200 Nontraumatic subdural hemorrhage, unspecified: Secondary | ICD-10-CM | POA: Diagnosis not present

## 2023-05-27 DIAGNOSIS — E854 Organ-limited amyloidosis: Secondary | ICD-10-CM | POA: Diagnosis not present

## 2023-05-27 DIAGNOSIS — G40901 Epilepsy, unspecified, not intractable, with status epilepticus: Secondary | ICD-10-CM | POA: Diagnosis not present

## 2023-05-27 DIAGNOSIS — G40101 Localization-related (focal) (partial) symptomatic epilepsy and epileptic syndromes with simple partial seizures, not intractable, with status epilepticus: Secondary | ICD-10-CM | POA: Diagnosis not present

## 2023-05-27 DIAGNOSIS — R4701 Aphasia: Secondary | ICD-10-CM | POA: Diagnosis not present

## 2023-05-27 LAB — COMPREHENSIVE METABOLIC PANEL
ALT: 19 U/L (ref 0–44)
AST: 33 U/L (ref 15–41)
Albumin: 3.4 g/dL — ABNORMAL LOW (ref 3.5–5.0)
Alkaline Phosphatase: 66 U/L (ref 38–126)
Anion gap: 5 (ref 5–15)
BUN: 20 mg/dL (ref 8–23)
CO2: 25 mmol/L (ref 22–32)
Calcium: 8.7 mg/dL — ABNORMAL LOW (ref 8.9–10.3)
Chloride: 105 mmol/L (ref 98–111)
Creatinine, Ser: 1.33 mg/dL — ABNORMAL HIGH (ref 0.61–1.24)
GFR, Estimated: 55 mL/min — ABNORMAL LOW (ref >60)
Glucose, Bld: 124 mg/dL — ABNORMAL HIGH (ref 70–99)
Potassium: 3.8 mmol/L (ref 3.5–5.1)
Sodium: 135 mmol/L (ref 135–145)
Total Bilirubin: 0.5 mg/dL (ref 0.0–1.2)
Total Protein: 5.9 g/dL — ABNORMAL LOW (ref 6.5–8.1)

## 2023-05-27 LAB — CBC
HCT: 35.6 % — ABNORMAL LOW (ref 39.0–52.0)
Hemoglobin: 12.2 g/dL — ABNORMAL LOW (ref 13.0–17.0)
MCH: 31.7 pg (ref 26.0–34.0)
MCHC: 34.3 g/dL (ref 30.0–36.0)
MCV: 92.5 fL (ref 80.0–100.0)
Platelets: 150 10*3/uL (ref 150–400)
RBC: 3.85 MIL/uL — ABNORMAL LOW (ref 4.22–5.81)
RDW: 14.1 % (ref 11.5–15.5)
WBC: 7.7 10*3/uL (ref 4.0–10.5)
nRBC: 0 % (ref 0.0–0.2)

## 2023-05-27 MED ORDER — LORAZEPAM 2 MG/ML IJ SOLN
1.0000 mg | INTRAMUSCULAR | Status: DC | PRN
  Administered 2023-05-27 – 2023-05-29 (×3): 1 mg via INTRAVENOUS
  Filled 2023-05-27 (×3): qty 1

## 2023-05-27 MED ORDER — HYDROMORPHONE HCL 1 MG/ML IJ SOLN
0.5000 mg | Freq: Once | INTRAMUSCULAR | Status: AC
  Administered 2023-05-27: 0.5 mg via INTRAVENOUS
  Filled 2023-05-27: qty 0.5

## 2023-05-27 NOTE — Progress Notes (Addendum)
 LTM EEG hooked up and running - no initial skin breakdown - push button tested - [facility] monitoring. Difficult set-up, tech was assisted by two members of nursing staff.  Pt has mittens on and soft restraints placed by nursing staff as set up was completed. Soft loose head wrap in place.

## 2023-05-27 NOTE — Progress Notes (Addendum)
 [facility] PROGRESS NOTE   Danny Patterson YNW:[id number] DOB: 1945-10-19 DOA: 05/26/2023  PCP: Smitty Cords, DO  Brief History:  78 y.o. male with medical history significant of HTN, stable BPH, could not tolerate Flomax, gout, lumbar fusion, history of kidney stone presents from home with syncopal episode.  He was found to have left-sided gaze preference and left arm and leg weakness.  Presented as a code stroke.  Apparently also had a shaking episode.  Seen by neurology.  Concern was for seizure activity.  He was hospitalized for further management.   Consultants: Neurology  Procedures: Continuous EEG    Subjective/Interval History: Patient not very responsive currently.  Noted to be moving all of his extremities.    Assessment/Plan:  Seizure/acute encephalopathy Thought to have seizure activity.  Did have left-sided symptoms.  Initially thought to be stroke. CT head did not show any acute findings.  Encephalomalacia was noted.  CT angiogram head and neck did not show any large vessel occlusion. Patient was seen by neurology.  Loaded with Keppra.  Remains on Keppra. Patient on continuous EEG currently. Remains quite encephalopathic. No infection identified.  WBC is normal. N.p.o. status for now since he is pretty much obtunded.  Chronic kidney disease stage IIIa Renal function close to baseline.  Monitor urine output.  Normocytic anemia No evidence of overt bleeding.  Continue to monitor.  Essential hypertension Noted to be on amlodipine.  Monitor blood pressures closely.  Hyperlipidemia Continue with statin.  History of gout Continue with allopurinol.  Colchicine on hold.  Obesity Estimated body mass index is 35.4 kg/m as calculated from the following:   Height as of this encounter: 5\' 10"  (1.778 m).   Weight as of this encounter: 111.9 kg.  DVT Prophylaxis: Subcutaneous heparin Code Status: Full code Family Communication: No family at  bedside Disposition Plan: To be determined.  PT OT SLP evaluation when he is more awake.  Status is: Inpatient Remains inpatient appropriate because: Acute encephalopathy, concern for seizure activity      Medications: Scheduled:  allopurinol  300 mg Oral Daily   amLODipine  5 mg Oral Daily   atorvastatin  20 mg Oral Daily   heparin  5,000 Units Subcutaneous Q8H   multivitamin with minerals  1 tablet Oral Daily   Continuous:  lactated ringers 100 mL/hr at 05/27/23 0157   levETIRAcetam 1,000 mg (05/27/23 1002)   MVH:QIONGEXBMWUXL **OR** acetaminophen, albuterol, baclofen, bisacodyl, hydrALAZINE, HYDROcodone-acetaminophen, ondansetron **OR** ondansetron (ZOFRAN) IV  Antibiotics: Anti-infectives (From admission, onward)    None       Objective:  Vital Signs  Vitals:   05/27/23 0500 05/27/23 0601 05/27/23 0700 05/27/23 0821  BP:  130/83  139/69  Pulse: 75 69 64 70  Resp: 15 14 (!) 0 (!) 29  Temp:  (!) 97.5 F (36.4 C)  (!) 97.3 F (36.3 C)  TempSrc:  Axillary  Axillary  SpO2: 90% 98% 97% 96%  Weight:      Height:        Intake/Output Summary (Last 24 hours) at 05/27/2023 1045 Last data filed at 05/27/2023 0517 Gross per 24 hour  Intake 240 ml  Output 900 ml  Net -660 ml   Filed Weights   05/26/23 1500 05/26/23 1608 05/27/23 0455  Weight: 107.2 kg 107.5 kg 111.9 kg    General appearance: Unresponsive for the most part. Resp: Clear to auscultation bilaterally.  Normal effort Cardio: S1-S2 is normal regular.  No S3-S4.  No rubs murmurs or  bruit GI: Abdomen is soft.  Nontender nondistended.  Bowel sounds are present normal.  No masses organomegaly Extremities: Moving his extremities    Lab Results:  Data Reviewed: I have personally reviewed following labs and reports of the imaging studies  CBC: Recent Labs  Lab 05/26/23 1511 05/26/23 1515 05/27/23 0546  WBC 7.3  --  7.7  NEUTROABS 4.3  --   --   HGB 12.9* 13.3 12.2*  HCT 38.0* 39.0 35.6*  MCV  93.6  --  92.5  PLT 162  --  150    Basic Metabolic Panel: Recent Labs  Lab 05/26/23 1511 05/26/23 1515 05/27/23 0546  NA 138 137 135  K 3.7 3.7 3.8  CL 104 104 105  CO2 21*  --  25  GLUCOSE 91 86 124*  BUN 25* 25* 20  CREATININE 1.67* 1.90* 1.33*  CALCIUM 9.2  --  8.7*    GFR: Estimated Creatinine Clearance: 57.4 mL/min (A) (by C-G formula based on SCr of 1.33 mg/dL (H)).  Liver Function Tests: Recent Labs  Lab 05/26/23 1511 05/27/23 0546  AST 38 33  ALT 19 19  ALKPHOS 74 66  BILITOT 0.7 0.5  PROT 6.2* 5.9*  ALBUMIN 3.7 3.4*     Coagulation Profile: Recent Labs  Lab 05/26/23 1511  INR 1.1    CBG: Recent Labs  Lab 05/26/23 1510  GLUCAP 80     Radiology Studies: Overnight EEG with video Result Date: 05/27/2023 Danny Quest, MD     05/27/2023  6:13 AM [name] MRN: [id number] Epilepsy Attending: Charlsie Patterson Referring Physician/Provider: Rejeana Brock, MD Duration: 05/26/2023 2258 to 05/27/2023 0615 Patient history:  78 yo M with syncopal episode at the kitchen table and fell around at 1420. Now with Left-sided gaze preference, left arm and leg weakness and hemianopia of the left side. Also developed numbness of the left leg. EEG to evaluate for seizure  Level of alertness:  awake, asleep  AEDs during EEG study: LEV  Technical aspects: This EEG study was done with scalp electrodes positioned according to the 10-20 International system of electrode placement. Electrical activity was reviewed with band pass filter of 1-70Hz , sensitivity of 7 uV/mm, display speed of 77mm/sec with a 60Hz  notched filter applied as appropriate. EEG data were recorded continuously and digitally stored.  Video monitoring was available and reviewed as appropriate.  Description: No posterior dominant rhythm was seen. EEG showed continuous generalized and maximal right parieto-occipital 3 to 6 Hz theta-delta slowing admixed with 13-15hz  beta activity. Sleep was  characterized by vertex waves, sleep spindles (12 to 14 Hz), maximal frontocentral region.  Hyperventilation and photic stimulation were not performed.    ABNORMALITY -Continuous slow, generalized and maximal right parieto-occipital region  IMPRESSION: This study is suggestive of cortical dysfunction in right parieto-occipital region likely secondary to underlying encephalomalacia.  Additionally there is moderate diffuse encephalopathy. No seizures or epileptiform discharges were noted.   Danny Patterson    EEG adult Result Date: 05/26/2023 Danny Quest, MD     05/26/2023  6:49 PM [name] MRN: [id number] Epilepsy Attending: Charlsie Patterson Referring Physician/Provider: Rejeana Brock, MD Date: 05/26/2023 Duration: 22.24 mins Patient history: 78 yo M with syncopal episode at the kitchen table and fell around at 1420. Now with Left-sided gaze preference, left arm and leg weakness and hemianopia of the left side. Also developed numbness of the left leg. EEG to evaluate for seizure Level of alertness:  lethargic/coma AEDs during EEG study: LEV Technical aspects: This EEG study was done with scalp electrodes positioned according to the 10-20 International system of electrode placement. Electrical activity was reviewed with band pass filter of 1-70Hz , sensitivity of 7 uV/mm, display speed of 56mm/sec with a 60Hz  notched filter applied as appropriate. EEG data were recorded continuously and digitally stored.  Video monitoring was available and reviewed as appropriate. Description: EEG showed continuous generalized and maximal right parieto-occipital 3 to 6 Hz theta-delta slowing. Lateralized periodic discharges with overriding fast activity were noted in right hemisphere, maximal right parieto-occipital region at 1 to 1.5 Hz, at times with overlying rhythmicity and waxing and waning morphology. Hyperventilation and photic stimulation were not performed.   ABNORMALITY -Lateralized periodic  discharges with overriding fast activity and rhythmicity, right hemisphere, maximal right parieto-occipital region -Continuous slow, generalized and maximal right parieto-occipital region IMPRESSION: This study showed evidence of epileptogenicity arising from right hemisphere, maximal right parieto-occipital region.  This EEG pattern is on the ictal-interictal continuum.  However, due to overlying fast activity, overlying rhythmicity as well as waxing and waning morphology, it is highly concerning for ictal nature. Additionally there is cortical dysfunction in right parieto-occipital region likely secondary to underlying encephalomalacia.  Lastly there is moderate diffuse encephalopathy. If concern for ictal-interictal activity persist, consider long-term EEG Dr. Amada Jupiter was notified Danny Patterson   CT ANGIO HEAD NECK W WO CM W PERF (CODE STROKE) Result Date: 05/26/2023 CLINICAL DATA:  Code stroke. Left-sided paralysis. Last known well at 11 o'clock a.m. EXAM: CT ANGIOGRAPHY HEAD AND NECK CT PERFUSION BRAIN TECHNIQUE: Multidetector CT imaging of the head and neck was performed using the standard protocol during bolus administration of intravenous contrast. Multiplanar CT image reconstructions and MIPs were obtained to evaluate the vascular anatomy. Carotid stenosis measurements (when applicable) are obtained utilizing NASCET criteria, using the distal internal carotid diameter as the denominator. Multiphase CT imaging of the brain was performed following IV bolus contrast injection. Subsequent parametric perfusion maps were calculated using RAPID software. RADIATION DOSE REDUCTION: This exam was performed according to the departmental dose-optimization program which includes automated exposure control, adjustment of the mA and/or kV according to patient size and/or use of iterative reconstruction technique. CONTRAST:  OMNIPAQUE IOHEXOL 350 MG/ML SOLN COMPARISON:  100 mL Omnipaque 350 scratched at CT  head without contrast 05/26/2023 at 3:20 p.m. FINDINGS: CTA NECK FINDINGS Aortic arch: A 3 vessel arch configuration is present. Minimal atherosclerotic calcifications are present in the distal arch. The great vessel origins are within normal limits. No focal stenosis or aneurysm is present. No dissection is present. Right carotid system: The right common carotid artery is within normal limits. Minimal calcifications are present in the proximal right external carotid artery and slightly more distal right internal carotid artery without significant stenosis. Cervical right ICA is otherwise normal. Left carotid system: The left common carotid artery is within normal limits. Minimal calcifications are present at the left carotid bifurcation without significant stenosis. The cervical left ICA is otherwise normal. Vertebral arteries: The vertebral arteries are codominant. Both vertebral arteries originate from the subclavian arteries without significant stenosis. No significant stenosis is present in either vertebral artery in neck. Skeleton: Moderate degenerative changes are present in the cervical spine. Facet hypertrophy contributes to moderate right foraminal stenosis at C3-4 uncovertebral spurring contributes to moderate foraminal stenosis bilaterally at C6-7. Other neck: The soft tissues of the neck are otherwise unremarkable. Salivary glands are within normal limits. Thyroid is normal. No significant adenopathy  is present. No focal mucosal or submucosal lesions are present. Upper chest: The lung apices are clear. The thoracic inlet is within normal limits. Review of the MIP images confirms the above findings CTA HEAD FINDINGS Anterior circulation: Atherosclerotic calcifications are present within the cavernous internal carotid arteries without a significant stenosis through the ICA termini. The left A1 segment is dominant. The anterior communicating artery is patent. The right M1 segment is normal. Moderate  stenosis is present in mid left M1 segment. MCA bifurcations are intact bilaterally. Moderate attenuation of distal ACA and MCA branch vessels present without a significant focal stenosis or occlusion with in the proximal circle-of-Willis. No aneurysm is present. Posterior circulation: The vertebral arteries are codominant. Left PICA origin is visualized and normal. The vertebrobasilar junction basilar artery normal. Both posterior cerebral arteries originate basilar tip. Moderate segmental stenoses are present in the left P2 segment. The PCA branch vessels are otherwise within normal limits. No aneurysm is present. Venous sinuses: The dural sinuses are patent. The straight sinus and deep cerebral veins are intact. Cortical veins are within normal limits. No significant vascular malformation is evident. Anatomic variants: None Review of the MIP images confirms the above findings CT Brain Perfusion Findings: ASPECTS: 10/10 CBF (<30%) Volume: 0mL Perfusion (Tmax>6.0s) volume: 0mL Mismatch Volume: 0mL IMPRESSION: 1. No large vessel occlusion. 2. Moderate stenosis in the mid left M1 segment. 3. Moderate segmental stenoses in the left P2 segment. 4. Moderate attenuation of distal ACA and MCA branch vessels without a significant focal stenosis or occlusion. 5. Minimal atherosclerotic changes at the carotid bifurcations and cavernous internal carotid arteries without significant stenosis. 6. Moderate degenerative changes in the cervical spine. 7. Moderate right foraminal stenosis at C3-4 and moderate foraminal stenosis bilaterally at C6-7. 8. CT perfusion is negative. The above was relayed via text pager to Dr. Amada Jupiter on 05/26/2023 at 15:50 . Electronically Signed   By: Marin Roberts M.D.   On: 05/26/2023 15:52   CT HEAD CODE STROKE WO CONTRAST Result Date: 05/26/2023 CLINICAL DATA:  Code stroke.  Left-sided paralysis. EXAM: CT HEAD WITHOUT CONTRAST TECHNIQUE: Contiguous axial images were obtained from the base  of the skull through the vertex without intravenous contrast. RADIATION DOSE REDUCTION: This exam was performed according to the departmental dose-optimization program which includes automated exposure control, adjustment of the mA and/or kV according to patient size and/or use of iterative reconstruction technique. COMPARISON:  None available FINDINGS: Brain: Encephalomalacia involving the posterior and medial right parietal lobe appears remote. Moderate generalized atrophy and white matter disease is present bilaterally. No acute infarct, hemorrhage or mass lesion is present. Deep brain nuclei are within normal limits. The ventricles are of normal size. No significant extraaxial fluid collection is present. The brainstem and cerebellum are within normal limits. Midline structures are within normal limits. Vascular: Atherosclerotic calcifications are present within the cavernous internal carotid arteries bilaterally and at the dural margin of the left vertebral artery. No hyperdense vessel is present. Skull: Calvarium is intact. No focal lytic or blastic lesions are present. No significant extracranial soft tissue lesion is present. Advanced degenerative changes are present in the right TMJ. Sinuses/Orbits: The paranasal sinuses and mastoid air cells are clear. The globes and orbits are within normal limits. ASPECTS [facility] Stroke Program Early CT Score) - Ganglionic level infarction (caudate, lentiform nuclei, internal capsule, insula, M1-M3 cortex): 7/7 - Supraganglionic infarction (M4-M6 cortex): 3/3 Total score (0-10 with 10 being normal): 10/10 IMPRESSION: 1. No acute intracranial abnormality or significant interval change.  2. Encephalomalacia involving the posterior and medial right parietal lobe appears remote. 3. Moderate generalized atrophy and white matter disease likely reflects the sequela of chronic microvascular ischemia. 4. Aspects is 10/10. 5. Advanced degenerative changes in the right TMJ. The  above was relayed via text pager to Dr. Amada Jupiter on 05/26/2023 at 15:28 . Electronically Signed   By: Marin Roberts M.D.   On: 05/26/2023 15:31       LOS: 0 days   Diangelo Radel Rito Ehrlich  [facility] Pager on [email or web address]  05/27/2023, 10:45 AM

## 2023-05-27 NOTE — Plan of Care (Signed)

## 2023-05-27 NOTE — Progress Notes (Signed)
 Subjective: No further seizures. In bed, answering some questions but still confused.  ROS: Unable to obtain due to poor mental status  Examination  Vital signs in last 24 hours: Temp:  [97.3 F (36.3 C)-98.3 F (36.8 C)] 97.3 F (36.3 C) (03/07 0821) Pulse Rate:  [64-93] 70 (03/07 0821) Resp:  [0-29] 29 (03/07 0821) BP: (130-154)/(48-84) 139/69 (03/07 0821) SpO2:  [90 %-100 %] 96 % (03/07 0821) Weight:  [107.2 kg-111.9 kg] 111.9 kg (03/07 0455)  General: lying in bed, NAD Neuro: awake, alert, able to tell me his name, doesn't know place and time, when told he is here for seizures says " I dont believe you", unable to follow commands, unable to name objects, appears to track examiner and move all extremities spontaneously in bed  Basic Metabolic Panel: Recent Labs  Lab 05/26/23 1511 05/26/23 1515 05/27/23 0546  NA 138 137 135  K 3.7 3.7 3.8  CL 104 104 105  CO2 21*  --  25  GLUCOSE 91 86 124*  BUN 25* 25* 20  CREATININE 1.67* 1.90* 1.33*  CALCIUM 9.2  --  8.7*    CBC: Recent Labs  Lab 05/26/23 1511 05/26/23 1515 05/27/23 0546  WBC 7.3  --  7.7  NEUTROABS 4.3  --   --   HGB 12.9* 13.3 12.2*  HCT 38.0* 39.0 35.6*  MCV 93.6  --  92.5  PLT 162  --  150     Coagulation Studies: Recent Labs    05/26/23 1511  LABPROT 14.3  INR 1.1    Imaging personally reviewed  CTH wo contrast 05/26/2023:  No acute intracranial abnormality or significant interval change. Encephalomalacia involving the posterior and medial right parietal lobe appears remote. Moderate generalized atrophy and white matter disease likely reflects the sequela of chronic microvascular ischemia.  CTA head and neck w and wo contrast 05/26/2023: No large vessel occlusion. Moderate stenosis in the mid left M1 segment. Moderate segmental stenoses in the left P2 segment. Moderate attenuation of distal ACA and MCA branch vessels without a significant focal stenosis or occlusion. Minimal atherosclerotic changes  at the carotid bifurcations and cavernous internal carotid arteries without significant stenosis. Moderate degenerative changes in the cervical spine. Moderate right foraminal stenosis at C3-4 and moderate foraminal stenosis bilaterally at C6-7. CT perfusion is negative.   ASSESSMENT AND PLAN: 78 y.o. male who presents with new onset focal status epilepticus.  He was loaded with Keppra.  New onset status epilepticus due to underlying stroke -No evidence of infection, electrolytes, blood glucose wnl  Recommendations - Continue LEV 1000mg  BID, can transition to PO when able to take PO - continue video eeg overnight, can dc tomorrow if no seizure - If patient remains agitated, can consider switching keppra to vimpat - continue seizure precautions - prn IV ativan for clinical seizure - Discussed plan with Dr Rito Ehrlich  I have spent a total of   36 minutes with the patient reviewing hospital notes,  test results, labs and examining the patient as well as establishing an assessment and plan. > 50% of time was spent in direct patient care.         Lindie Spruce Epilepsy [facility] For questions after 5pm please refer to AMION to reach the Neurologist on call

## 2023-05-27 NOTE — Procedures (Addendum)
 [name]  MRN: [id number]  Epilepsy Attending: Charlsie Quest  Referring Physician/Provider: Rejeana Brock, MD  Duration: 05/26/2023 2258 to 05/27/2023 2258  Patient history:  78 yo M with syncopal episode at the kitchen table and fell around at 1420. Now with Left-sided gaze preference, left arm and leg weakness and hemianopia of the left side. Also developed numbness of the left leg. EEG to evaluate for seizure   Level of alertness:  awake, asleep   AEDs during EEG study: LEV   Technical aspects: This EEG study was done with scalp electrodes positioned according to the 10-20 International system of electrode placement. Electrical activity was reviewed with band pass filter of 1-70Hz , sensitivity of 7 uV/mm, display speed of 19mm/sec with a 60Hz  notched filter applied as appropriate. EEG data were recorded continuously and digitally stored.  Video monitoring was available and reviewed as appropriate.   Description: No posterior dominant rhythm was seen. EEG showed continuous generalized and maximal right parieto-occipital 3 to 6 Hz theta-delta slowing admixed with 13-15hz  beta activity. Sleep was characterized by vertex waves, sleep spindles (12 to 14 Hz), maximal frontocentral region.  Hyperventilation and photic stimulation were not performed.      ABNORMALITY -Continuous slow, generalized and maximal right parieto-occipital region   IMPRESSION: This study is suggestive of cortical dysfunction in right parieto-occipital region likely secondary to underlying encephalomalacia.  Additionally there is moderate diffuse encephalopathy. No seizures or epileptiform discharges were noted.   Janee Ureste Annabelle Harman

## 2023-05-27 NOTE — TOC Initial Note (Signed)
 Transition of Care [facility]) - Initial/Assessment Note    Patient Details  Name: Danny Patterson MRN: [id number] Date of Birth: 1945/09/29  [facility] CM/SW Contact:    Lamonte Sakai, Student-Social Work Phone Number: 05/27/2023, 9:05 AM  Clinical Narrative:                 Pt admitted from home with spouse.  No current TOC needs. Please consult as needs arise.        Patient Goals and CMS Choice            Expected Discharge Plan and Services       Living arrangements for the past 2 months: Single Family Home                                      Prior Living Arrangements/Services Living arrangements for the past 2 months: Single Family Home Lives with:: Spouse                   Activities of Daily Living   ADL Screening (condition at time of admission) Independently performs ADLs?: Yes (appropriate for developmental age) Is the patient deaf or have difficulty hearing?: Yes Does the patient have difficulty seeing, even when wearing glasses/contacts?: No Does the patient have difficulty concentrating, remembering, or making decisions?: No  Permission Sought/Granted                  Emotional Assessment       Orientation: : Oriented to Self, Oriented to Place, Oriented to  Time, Oriented to Situation      Admission diagnosis:  Status epilepticus (HCC) [G40.901] Seizure [facility]) [R56.9] Patient Active Problem List   Diagnosis Date Noted   Seizure (HCC) 05/26/2023   PCP:  Smitty Cords, DO Pharmacy:   [facility] [phone] [place], [place] - [address] [place] [address] Phone: [phone] Fax: [phone]     Social Drivers of Health (SDOH) Social History: SDOH Screenings   Food Insecurity: Patient Unable To Answer (05/27/2023)  Housing: Patient Unable To Answer (05/27/2023)  Transportation Needs: Patient Unable To Answer (05/27/2023)  Utilities: Patient Unable To Answer (05/27/2023)   SDOH  Interventions:     Readmission Risk Interventions     No data to display

## 2023-05-28 ENCOUNTER — Inpatient Hospital Stay (HOSPITAL_COMMUNITY)

## 2023-05-28 DIAGNOSIS — R569 Unspecified convulsions: Secondary | ICD-10-CM | POA: Diagnosis not present

## 2023-05-28 DIAGNOSIS — G9340 Encephalopathy, unspecified: Secondary | ICD-10-CM | POA: Diagnosis not present

## 2023-05-28 DIAGNOSIS — G40901 Epilepsy, unspecified, not intractable, with status epilepticus: Secondary | ICD-10-CM | POA: Diagnosis not present

## 2023-05-28 DIAGNOSIS — I10 Essential (primary) hypertension: Secondary | ICD-10-CM | POA: Diagnosis not present

## 2023-05-28 NOTE — Progress Notes (Signed)
 LTM EEG disconnected - no skin breakdown at [facility].

## 2023-05-28 NOTE — Procedures (Signed)
 [name]  MRN: [id number]  Epilepsy Attending: Charlsie Quest  Referring Physician/Provider: Rejeana Brock, MD  Duration: 05/27/2023 2258 to 05/28/2023 0802   Patient history:  78 yo M with syncopal episode at the kitchen table and fell around at 1420. Now with Left-sided gaze preference, left arm and leg weakness and hemianopia of the left side. Also developed numbness of the left leg. EEG to evaluate for seizure   Level of alertness:  awake, asleep   AEDs during EEG study: LEV   Technical aspects: This EEG study was done with scalp electrodes positioned according to the 10-20 International system of electrode placement. Electrical activity was reviewed with band pass filter of 1-70Hz , sensitivity of 7 uV/mm, display speed of 53mm/sec with a 60Hz  notched filter applied as appropriate. EEG data were recorded continuously and digitally stored.  Video monitoring was available and reviewed as appropriate.   Description: No posterior dominant rhythm was seen. EEG showed continuous generalized and maximal right parieto-occipital 3 to 6 Hz theta-delta slowing admixed with 13-15hz  beta activity. Sleep was characterized by vertex waves, sleep spindles (12 to 14 Hz), maximal frontocentral region.  Hyperventilation and photic stimulation were not performed.      ABNORMALITY -Continuous slow, generalized and maximal right parieto-occipital region   IMPRESSION: This study is suggestive of cortical dysfunction in right parieto-occipital region likely secondary to underlying encephalomalacia. Additionally there is moderate diffuse encephalopathy. No seizures or epileptiform discharges were noted.   Treyvion Durkee Annabelle Harman

## 2023-05-28 NOTE — Evaluation (Signed)
 Physical Therapy Evaluation Patient Details Name: Danny Patterson MRN: [id number] DOB: [birth year] Today's Date: 05/28/2023  History of Present Illness  Pt is a 78 y.o. M who presents with left sided gaze preference and weakness. Presented as code stroke. CT head negative for acute abnormality. Now being worked up for seizure. Significant PMH: HTN, stable BPH, gout, lumbar fusion, history of kidney stone.  Clinical Impression  PTA, pt lives with his family and is a limited community ambulator with a cane vs no assistive device. Pt presents with impaired cognition, alertness, balance, strength, and activity tolerance. Pt requiring moderate assist for functional mobility (+2 safety/equipment). Pt ambulating room distances with a walker. Significant difficulty with sequencing/problem solving, requiring dense multimodal cueing for motor tasks and execution. Would like to trial next session without the RW as this is not the typical assistive device pt uses, which may have played a factor in requiring increased assist with obstacle navigation and turning. Patient will benefit from intensive inpatient follow-up therapy, >3 hours/day in order to address deficits and maximize functional mobility.         If plan is discharge home, recommend the following: A lot of help with walking and/or transfers;A lot of help with bathing/dressing/bathroom   Can travel by private vehicle        Equipment Recommendations BSC/3in1;Wheelchair (measurements PT);Wheelchair cushion (measurements PT)  Recommendations for Other Services       Functional Status Assessment Patient has had a recent decline in their functional status and demonstrates the ability to make significant improvements in function in a reasonable and predictable amount of time.     Precautions / Restrictions Precautions Precautions: Fall;Other (comment) Precaution/Restrictions Comments: Wrist restraints, mitts Restrictions Weight Bearing Restrictions  Per Provider Order: No      Mobility  Bed Mobility Overal bed mobility: Needs Assistance Bed Mobility: Supine to Sit     Supine to sit: Mod assist     General bed mobility comments: Assist for BLE's off edge of bed    Transfers Overall transfer level: Needs assistance Equipment used: Rolling walker (2 wheels) Transfers: Sit to/from Stand Sit to Stand: Min assist, Max assist           General transfer comment: MinA to rise from edge of bed,PT stabilized walker since pt pulling up from handle. MaxA to facilitate transition back to sitting position from standing due to pt lack of initiation    Ambulation/Gait Ambulation/Gait assistance: Mod assist, +2 safety/equipment Gait Distance (Feet): 20 Feet Assistive device: Rolling walker (2 wheels) Gait Pattern/deviations: Step-through pattern, Decreased stride length Gait velocity: decreased Gait velocity interpretation: <1.31 ft/sec, indicative of household ambulator   General Gait Details: Crouched posture, pt with poor positioning on inside of walker, max facilitation and cueing for turns and sequencing  Stairs            Wheelchair Mobility     Tilt Bed    Modified Rankin (Stroke Patients Only)       Balance Overall balance assessment: Needs assistance Sitting-balance support: Feet supported Sitting balance-Leahy Scale: Fair     Standing balance support: Bilateral upper extremity supported Standing balance-Leahy Scale: Poor Standing balance comment: reliant on BUE support                             Pertinent Vitals/Pain Pain Assessment Pain Assessment: Faces Faces Pain Scale: No hurt    Home Living Family/patient expects to be discharged to:: Private residence Living  Arrangements: Children;Spouse/significant other Available Help at Discharge: Family Type of Home: House         Home Layout: One level Home Equipment: Gilmer Mor - single point      Prior Function Prior Level of  Function : Needs assist             Mobility Comments: limited community ambulator, uses cane vs no AD, no recent falls ADLs Comments: intermittent assist for shower transfers     Extremity/Trunk Assessment   Upper Extremity Assessment Upper Extremity Assessment: Defer to OT evaluation    Lower Extremity Assessment Lower Extremity Assessment: Generalized weakness    Cervical / Trunk Assessment Cervical / Trunk Assessment: Kyphotic  Communication   Communication Communication: No apparent difficulties    Cognition Arousal: Lethargic Behavior During Therapy: Flat affect   PT - Cognitive impairments: Orientation, Awareness, Attention, Memory, Initiation, Sequencing, Problem solving, Safety/Judgement   Orientation impairments: Time, Place, Situation                   PT - Cognition Comments: Pt lethargic; pt family reports he received Ativan last night. Able to correctly state "hospital," when given options for place. Unable to state month when given clues. Poor sequencing and problem solving Following commands: Impaired Following commands impaired: Follows one step commands inconsistently     Cueing Cueing Techniques: Verbal cues, Tactile cues, Visual cues     General Comments      Exercises     Assessment/Plan    PT Assessment Patient needs continued PT services  PT Problem List Decreased strength;Decreased balance;Decreased activity tolerance;Decreased mobility;Decreased cognition;Decreased safety awareness       PT Treatment Interventions Gait training;DME instruction;Stair training;Functional mobility training;Therapeutic exercise;Therapeutic activities;Balance training;Patient/family education    PT Goals (Current goals can be found in the Care Plan section)  Acute Rehab PT Goals Patient Stated Goal: pt daughter would like for him to walk PT Goal Formulation: With patient/family Time For Goal Achievement: 06/11/23 Potential to Achieve Goals:  Good    Frequency Min 3X/week     Co-evaluation               AM-PAC PT "6 Clicks" Mobility  Outcome Measure Help needed turning from your back to your side while in a flat bed without using bedrails?: A Lot Help needed moving from lying on your back to sitting on the side of a flat bed without using bedrails?: A Lot Help needed moving to and from a bed to a chair (including a wheelchair)?: A Lot Help needed standing up from a chair using your arms (e.g., wheelchair or bedside chair)?: A Lot Help needed to walk in hospital room?: A Lot Help needed climbing 3-5 steps with a railing? : Total 6 Click Score: 11    End of Session Equipment Utilized During Treatment: Gait belt Activity Tolerance: Patient tolerated treatment well Patient left: in chair;with call bell/phone within reach;with chair alarm set Nurse Communication: Mobility status PT Visit Diagnosis: Unsteadiness on feet (R26.81);History of falling (Z91.81);Difficulty in walking, not elsewhere classified (R26.2)    Time: 4098-1191 PT Time Calculation (min) (ACUTE ONLY): 42 min   Charges:   PT Evaluation $PT Eval Moderate Complexity: 1 Mod PT Treatments $Therapeutic Activity: 23-37 mins PT General Charges $$ ACUTE PT VISIT: 1 Visit         Lillia Pauls, PT, DPT Acute Rehabilitation Services Office [phone]   Norval Morton 05/28/2023, 4:11 PM

## 2023-05-28 NOTE — Progress Notes (Signed)
 [facility] PROGRESS NOTE   Danny Patterson:[id number] DOB: Sep 21, 1945 DOA: 05/26/2023  PCP: Smitty Cords, DO  Brief History:  78 y.o. male with medical history significant of HTN, stable BPH, could not tolerate Flomax, gout, lumbar fusion, history of kidney stone presents from home with syncopal episode.  He was found to have left-sided gaze preference and left arm and leg weakness.  Presented as a code stroke.  Apparently also had a shaking episode.  Seen by neurology.  Concern was for seizure activity.  He was hospitalized for further management.   Consultants: Neurology  Procedures: Continuous EEG    Subjective/Interval History: Patient noted to be much more awake and alert today compared to yesterday though he is somewhat distracted.  Does not answer questions appropriately.     Assessment/Plan:  Seizure/acute encephalopathy Thought to have seizure activity.  Did have left-sided symptoms.  Initially thought to be stroke. CT head did not show any acute findings.  Encephalomalacia was noted.  CT angiogram head and neck did not show any large vessel occlusion. Patient was seen by neurology.  Loaded with Keppra.  Remains on Keppra. Patient was on continuous EEG and remains on it this morning.  Remains encephalopathic though better than yesterday. SLP evaluation.  PT and OT evaluation. No infection identified.  WBC is normal. Await further input from neurology.  Chronic kidney disease stage IIIa No old labs available.  Creatinine was 1.67 on admission.  Noted to be 1.33 yesterday.  Labs pending from today.  Monitor urine output.  Avoid nephrotoxic agents.  Normocytic anemia No evidence of overt bleeding.  Continue to monitor.  Essential hypertension Noted to be on amlodipine.  Occasional high readings noted which could be due to agitation.  Continue to monitor.  Hyperlipidemia Continue with statin.  History of gout Continue with allopurinol.  Colchicine  on hold.  Obesity Estimated body mass index is 35.4 kg/m as calculated from the following:   Height as of this encounter: 5\' 10"  (1.778 m).   Weight as of this encounter: 111.9 kg.  DVT Prophylaxis: Subcutaneous heparin Code Status: Full code Family Communication: Discussed with her daughter over the phone Disposition Plan: To be determined.  PT OT SLP evaluation  Status is: Inpatient Remains inpatient appropriate because: Acute encephalopathy, concern for seizure activity      Medications: Scheduled:  allopurinol  300 mg Oral Daily   heparin  5,000 Units Subcutaneous Q8H   multivitamin with minerals  1 tablet Oral Daily   Continuous:  lactated ringers 100 mL/hr at 05/28/23 0139   levETIRAcetam 1,000 mg (05/28/23 0857)   WJX:BJYNWGNFAOZHY **OR** acetaminophen, albuterol, baclofen, hydrALAZINE, HYDROcodone-acetaminophen, LORazepam, ondansetron **OR** ondansetron (ZOFRAN) IV     Objective:  Vital Signs  Vitals:   05/28/23 0400 05/28/23 0447 05/28/23 0457 05/28/23 0748  BP: (!) 173/83 (!) 173/83 (!) 152/98 (!) 152/98  Pulse: 61  70 75  Resp: 13  18 (!) 23  Temp: 98 F (36.7 C)  (!) 97.5 F (36.4 C) (!) 97.5 F (36.4 C)  TempSrc: Axillary  Oral Oral  SpO2: 100%  100% 99%  Weight:      Height:        Intake/Output Summary (Last 24 hours) at 05/28/2023 1042 Last data filed at 05/28/2023 0500 Gross per 24 hour  Intake --  Output 1600 ml  Net -1600 ml   Filed Weights   05/26/23 1500 05/26/23 1608 05/27/23 0455  Weight: 107.2 kg 107.5 kg 111.9 kg  General appearance: Awake alert.  In no distress.  Distracted. Resp: Clear to auscultation bilaterally.  Normal effort Cardio: S1-S2 is normal regular.  No S3-S4.  No rubs murmurs or bruit GI: Abdomen is soft.  Nontender nondistended.  Bowel sounds are present normal.  No masses organomegaly Extremities: No edema.   Neurologic: He is awake.  Not answering orientation questions.  Noted to be moving all of his  extremities.   Lab Results:  Data Reviewed: I have personally reviewed following labs and reports of the imaging studies  CBC: Recent Labs  Lab 05/26/23 1511 05/26/23 1515 05/27/23 0546  WBC 7.3  --  7.7  NEUTROABS 4.3  --   --   HGB 12.9* 13.3 12.2*  HCT 38.0* 39.0 35.6*  MCV 93.6  --  92.5  PLT 162  --  150    Basic Metabolic Panel: Recent Labs  Lab 05/26/23 1511 05/26/23 1515 05/27/23 0546  NA 138 137 135  K 3.7 3.7 3.8  CL 104 104 105  CO2 21*  --  25  GLUCOSE 91 86 124*  BUN 25* 25* 20  CREATININE 1.67* 1.90* 1.33*  CALCIUM 9.2  --  8.7*    GFR: Estimated Creatinine Clearance: 57.4 mL/min (A) (by C-G formula based on SCr of 1.33 mg/dL (H)).  Liver Function Tests: Recent Labs  Lab 05/26/23 1511 05/27/23 0546  AST 38 33  ALT 19 19  ALKPHOS 74 66  BILITOT 0.7 0.5  PROT 6.2* 5.9*  ALBUMIN 3.7 3.4*     Coagulation Profile: Recent Labs  Lab 05/26/23 1511  INR 1.1    CBG: Recent Labs  Lab 05/26/23 1510  GLUCAP 80     Radiology Studies: Overnight EEG with video Result Date: 05/27/2023 Charlsie Quest, MD     05/28/2023  7:31 AM [name] MRN: [id number] Epilepsy Attending: Charlsie Quest Referring Physician/Provider: Rejeana Brock, MD Duration: 05/26/2023 2258 to 05/27/2023 2258 Patient history:  78 yo M with syncopal episode at the kitchen table and fell around at 1420. Now with Left-sided gaze preference, left arm and leg weakness and hemianopia of the left side. Also developed numbness of the left leg. EEG to evaluate for seizure  Level of alertness:  awake, asleep  AEDs during EEG study: LEV  Technical aspects: This EEG study was done with scalp electrodes positioned according to the 10-20 International system of electrode placement. Electrical activity was reviewed with band pass filter of 1-70Hz , sensitivity of 7 uV/mm, display speed of 57mm/sec with a 60Hz  notched filter applied as appropriate. EEG data were recorded  continuously and digitally stored.  Video monitoring was available and reviewed as appropriate.  Description: No posterior dominant rhythm was seen. EEG showed continuous generalized and maximal right parieto-occipital 3 to 6 Hz theta-delta slowing admixed with 13-15hz  beta activity. Sleep was characterized by vertex waves, sleep spindles (12 to 14 Hz), maximal frontocentral region.  Hyperventilation and photic stimulation were not performed.    ABNORMALITY -Continuous slow, generalized and maximal right parieto-occipital region  IMPRESSION: This study is suggestive of cortical dysfunction in right parieto-occipital region likely secondary to underlying encephalomalacia.  Additionally there is moderate diffuse encephalopathy. No seizures or epileptiform discharges were noted.  Charlsie Quest    EEG adult Result Date: 05/26/2023 Charlsie Quest, MD     05/26/2023  6:49 PM Patient Name: Jayro Mcmath MRN: [id number] Epilepsy Attending: Charlsie Quest Referring Physician/Provider: Rejeana Brock, MD Date: 05/26/2023 Duration: 22.24 mins  Patient history: 78 yo M with syncopal episode at the kitchen table and fell around at 1420. Now with Left-sided gaze preference, left arm and leg weakness and hemianopia of the left side. Also developed numbness of the left leg. EEG to evaluate for seizure Level of alertness:  lethargic/coma AEDs during EEG study: LEV Technical aspects: This EEG study was done with scalp electrodes positioned according to the 10-20 International system of electrode placement. Electrical activity was reviewed with band pass filter of 1-70Hz , sensitivity of 7 uV/mm, display speed of 75mm/sec with a 60Hz  notched filter applied as appropriate. EEG data were recorded continuously and digitally stored.  Video monitoring was available and reviewed as appropriate. Description: EEG showed continuous generalized and maximal right parieto-occipital 3 to 6 Hz theta-delta slowing. Lateralized periodic  discharges with overriding fast activity were noted in right hemisphere, maximal right parieto-occipital region at 1 to 1.5 Hz, at times with overlying rhythmicity and waxing and waning morphology. Hyperventilation and photic stimulation were not performed.   ABNORMALITY -Lateralized periodic discharges with overriding fast activity and rhythmicity, right hemisphere, maximal right parieto-occipital region -Continuous slow, generalized and maximal right parieto-occipital region IMPRESSION: This study showed evidence of epileptogenicity arising from right hemisphere, maximal right parieto-occipital region.  This EEG pattern is on the ictal-interictal continuum.  However, due to overlying fast activity, overlying rhythmicity as well as waxing and waning morphology, it is highly concerning for ictal nature. Additionally there is cortical dysfunction in right parieto-occipital region likely secondary to underlying encephalomalacia.  Lastly there is moderate diffuse encephalopathy. If concern for ictal-interictal activity persist, consider long-term EEG Dr. Amada Jupiter was notified Charlsie Quest   CT ANGIO HEAD NECK W WO CM W PERF (CODE STROKE) Result Date: 05/26/2023 CLINICAL DATA:  Code stroke. Left-sided paralysis. Last known well at 11 o'clock a.m. EXAM: CT ANGIOGRAPHY HEAD AND NECK CT PERFUSION BRAIN TECHNIQUE: Multidetector CT imaging of the head and neck was performed using the standard protocol during bolus administration of intravenous contrast. Multiplanar CT image reconstructions and MIPs were obtained to evaluate the vascular anatomy. Carotid stenosis measurements (when applicable) are obtained utilizing NASCET criteria, using the distal internal carotid diameter as the denominator. Multiphase CT imaging of the brain was performed following IV bolus contrast injection. Subsequent parametric perfusion maps were calculated using RAPID software. RADIATION DOSE REDUCTION: This exam was performed according to  the departmental dose-optimization program which includes automated exposure control, adjustment of the mA and/or kV according to patient size and/or use of iterative reconstruction technique. CONTRAST:  OMNIPAQUE IOHEXOL 350 MG/ML SOLN COMPARISON:  100 mL Omnipaque 350 scratched at CT head without contrast 05/26/2023 at 3:20 p.m. FINDINGS: CTA NECK FINDINGS Aortic arch: A 3 vessel arch configuration is present. Minimal atherosclerotic calcifications are present in the distal arch. The great vessel origins are within normal limits. No focal stenosis or aneurysm is present. No dissection is present. Right carotid system: The right common carotid artery is within normal limits. Minimal calcifications are present in the proximal right external carotid artery and slightly more distal right internal carotid artery without significant stenosis. Cervical right ICA is otherwise normal. Left carotid system: The left common carotid artery is within normal limits. Minimal calcifications are present at the left carotid bifurcation without significant stenosis. The cervical left ICA is otherwise normal. Vertebral arteries: The vertebral arteries are codominant. Both vertebral arteries originate from the subclavian arteries without significant stenosis. No significant stenosis is present in either vertebral artery in neck. Skeleton: Moderate degenerative changes  are present in the cervical spine. Facet hypertrophy contributes to moderate right foraminal stenosis at C3-4 uncovertebral spurring contributes to moderate foraminal stenosis bilaterally at C6-7. Other neck: The soft tissues of the neck are otherwise unremarkable. Salivary glands are within normal limits. Thyroid is normal. No significant adenopathy is present. No focal mucosal or submucosal lesions are present. Upper chest: The lung apices are clear. The thoracic inlet is within normal limits. Review of the MIP images confirms the above findings CTA HEAD FINDINGS  Anterior circulation: Atherosclerotic calcifications are present within the cavernous internal carotid arteries without a significant stenosis through the ICA termini. The left A1 segment is dominant. The anterior communicating artery is patent. The right M1 segment is normal. Moderate stenosis is present in mid left M1 segment. MCA bifurcations are intact bilaterally. Moderate attenuation of distal ACA and MCA branch vessels present without a significant focal stenosis or occlusion with in the proximal circle-of-Willis. No aneurysm is present. Posterior circulation: The vertebral arteries are codominant. Left PICA origin is visualized and normal. The vertebrobasilar junction basilar artery normal. Both posterior cerebral arteries originate basilar tip. Moderate segmental stenoses are present in the left P2 segment. The PCA branch vessels are otherwise within normal limits. No aneurysm is present. Venous sinuses: The dural sinuses are patent. The straight sinus and deep cerebral veins are intact. Cortical veins are within normal limits. No significant vascular malformation is evident. Anatomic variants: None Review of the MIP images confirms the above findings CT Brain Perfusion Findings: ASPECTS: 10/10 CBF (<30%) Volume: 0mL Perfusion (Tmax>6.0s) volume: 0mL Mismatch Volume: 0mL IMPRESSION: 1. No large vessel occlusion. 2. Moderate stenosis in the mid left M1 segment. 3. Moderate segmental stenoses in the left P2 segment. 4. Moderate attenuation of distal ACA and MCA branch vessels without a significant focal stenosis or occlusion. 5. Minimal atherosclerotic changes at the carotid bifurcations and cavernous internal carotid arteries without significant stenosis. 6. Moderate degenerative changes in the cervical spine. 7. Moderate right foraminal stenosis at C3-4 and moderate foraminal stenosis bilaterally at C6-7. 8. CT perfusion is negative. The above was relayed via text pager to Dr. Amada Jupiter on 05/26/2023 at  15:50 . Electronically Signed   By: Marin Roberts M.D.   On: 05/26/2023 15:52   CT HEAD CODE STROKE WO CONTRAST Result Date: 05/26/2023 CLINICAL DATA:  Code stroke.  Left-sided paralysis. EXAM: CT HEAD WITHOUT CONTRAST TECHNIQUE: Contiguous axial images were obtained from the base of the skull through the vertex without intravenous contrast. RADIATION DOSE REDUCTION: This exam was performed according to the departmental dose-optimization program which includes automated exposure control, adjustment of the mA and/or kV according to patient size and/or use of iterative reconstruction technique. COMPARISON:  None available FINDINGS: Brain: Encephalomalacia involving the posterior and medial right parietal lobe appears remote. Moderate generalized atrophy and white matter disease is present bilaterally. No acute infarct, hemorrhage or mass lesion is present. Deep brain nuclei are within normal limits. The ventricles are of normal size. No significant extraaxial fluid collection is present. The brainstem and cerebellum are within normal limits. Midline structures are within normal limits. Vascular: Atherosclerotic calcifications are present within the cavernous internal carotid arteries bilaterally and at the dural margin of the left vertebral artery. No hyperdense vessel is present. Skull: Calvarium is intact. No focal lytic or blastic lesions are present. No significant extracranial soft tissue lesion is present. Advanced degenerative changes are present in the right TMJ. Sinuses/Orbits: The paranasal sinuses and mastoid air cells are clear. The globes and  orbits are within normal limits. ASPECTS [facility] Stroke Program Early CT Score) - Ganglionic level infarction (caudate, lentiform nuclei, internal capsule, insula, M1-M3 cortex): 7/7 - Supraganglionic infarction (M4-M6 cortex): 3/3 Total score (0-10 with 10 being normal): 10/10 IMPRESSION: 1. No acute intracranial abnormality or significant interval change.  2. Encephalomalacia involving the posterior and medial right parietal lobe appears remote. 3. Moderate generalized atrophy and white matter disease likely reflects the sequela of chronic microvascular ischemia. 4. Aspects is 10/10. 5. Advanced degenerative changes in the right TMJ. The above was relayed via text pager to Dr. Amada Jupiter on 05/26/2023 at 15:28 . Electronically Signed   By: Marin Roberts M.D.   On: 05/26/2023 15:31       LOS: 1 day   Moraima Burd  [facility] Pager on [email or web address]  05/28/2023, 10:42 AM

## 2023-05-28 NOTE — Evaluation (Signed)
 Clinical/Bedside Swallow Evaluation Patient Details  Name: Danny Patterson MRN: [id number] Date of Birth: 1945/07/11  Today's Date: 05/28/2023 Time: SLP Start Time (ACUTE ONLY): 1203 SLP Stop Time (ACUTE ONLY): 1227 SLP Time Calculation (min) (ACUTE ONLY): 24 min  Past Medical History: History reviewed. No pertinent past medical history. Past Surgical History:  HPI:  78 y.o. male with medical history significant of HTN, stable BPH, could not tolerate Flomax, gout, lumbar fusion, history of kidney stone presents from home with syncopal episode.  He was found to have left-sided gaze preference and left arm and leg weakness.  Presented as a code stroke.  Apparently also had a shaking episode. Head CT No acute intracranial abnormality or significant interval change, encephalomalacia involving the posterior and medial right parietal lobe appears remote. Concern is now for seizure activity vs stroke.    Assessment / Plan / Recommendation  Clinical Impression  Pt lethargic but able to adequately wake for swallow assessment with daughters present. Pt has no upper dentition and does not wear dentures and refrains from eating steak per family. Oromotor abilities were within functional limits and moderately strong volitional cough. He did not show signs of aspiration with cup/straw sips thin with multiple trials. Mastication slightly slower but functional and cleared oral cavity. Recommend regular texture and daughters in agreement (provided menu and daughters can order foods pt desires and are not to tough to masticate). Thin liquids, would initially give meds whole in puree until alertness improves then can attempt pills with water. Eat/drink only when alert and he will need full assist with meals. ST will follow up briefly to ensure recommendations are safe and efficient. SLP Visit Diagnosis: Dysphagia, unspecified (R13.10)    Aspiration Risk  Mild aspiration risk    Diet Recommendation Regular;Thin liquid     Liquid Administration via: Straw;Cup Medication Administration: Whole meds with puree (until more alert) Supervision: Staff to assist with self feeding Compensations: Slow rate;Small sips/bites Postural Changes: Seated upright at 90 degrees    Other  Recommendations Oral Care Recommendations: Oral care BID    Recommendations for follow up therapy are one component of a multi-disciplinary discharge planning process, led by the attending physician.  Recommendations may be updated based on patient status, additional functional criteria and insurance authorization.  Follow up Recommendations No SLP follow up      Assistance Recommended at Discharge    Functional Status Assessment Patient has had a recent decline in their functional status and demonstrates the ability to make significant improvements in function in a reasonable and predictable amount of time.  Frequency and Duration min 1 x/week  1 week       Prognosis Prognosis for improved oropharyngeal function: Good      Swallow Study   General HPI: 78 y.o. male with medical history significant of HTN, stable BPH, could not tolerate Flomax, gout, lumbar fusion, history of kidney stone presents from home with syncopal episode.  He was found to have left-sided gaze preference and left arm and leg weakness.  Presented as a code stroke.  Apparently also had a shaking episode. Head CT No acute intracranial abnormality or significant interval change, encephalomalacia involving the posterior and medial right parietal lobe appears remote. Concern is now for seizure activity vs stroke. Type of Study: Bedside Swallow Evaluation Previous Swallow Assessment:  (none) Diet Prior to this Study: NPO Temperature Spikes Noted: No Respiratory Status: Room air History of Recent Intubation: No Behavior/Cognition: Cooperative;Pleasant mood;Other (Comment) (able to get pt adequatley awake) Oral  Cavity Assessment: Within Functional Limits Oral Care  Completed by SLP: No Oral Cavity - Dentition:  (no upper dentition, doesn't wear dentures,) Vision: Functional for self-feeding Self-Feeding Abilities: Needs assist Patient Positioning: Upright in bed Baseline Vocal Quality: Normal Volitional Cough: Strong Volitional Swallow: Able to elicit    Oral/Motor/Sensory Function Overall Oral Motor/Sensory Function: Within functional limits   Ice Chips Ice chips: Not tested   Thin Liquid Thin Liquid: Within functional limits Presentation: Cup;Straw    Nectar Thick Nectar Thick Liquid: Not tested   Honey Thick Honey Thick Liquid: Not tested   Puree Puree: Within functional limits   Solid     Solid: Within functional limits      Royce Macadamia 05/28/2023,1:10 PM

## 2023-05-28 NOTE — Progress Notes (Signed)
 Subjective: No further seizures.  Family reports that he was doing somewhat better earlier, but fell asleep about an hour ago and has been very somnolent since that time.  He has been less agitated.  He appears to be getting his days and nights slightly mixed up.  ROS: Unable to obtain due to poor mental status  Examination  Vital signs in last 24 hours: Temp:  [97.5 F (36.4 C)-98.5 F (36.9 C)] 97.5 F (36.4 C) (03/08 0748) Pulse Rate:  [61-75] 75 (03/08 0748) Resp:  [13-23] 23 (03/08 0748) BP: (123-173)/(71-98) 152/98 (03/08 0748) SpO2:  [97 %-100 %] 99 % (03/08 0748)  General: lying in bed, NAD Neuro: Asleep, but with stimulation he does arouse, he is able to answer some simple questions but is unable to recognize family members.  He moves all extremities relatively symmetrically though does not cooperate with formal testing    Basic Metabolic Panel: Recent Labs  Lab 05/26/23 1511 05/26/23 1515 05/27/23 0546  NA 138 137 135  K 3.7 3.7 3.8  CL 104 104 105  CO2 21*  --  25  GLUCOSE 91 86 124*  BUN 25* 25* 20  CREATININE 1.67* 1.90* 1.33*  CALCIUM 9.2  --  8.7*    CBC: Recent Labs  Lab 05/26/23 1511 05/26/23 1515 05/27/23 0546  WBC 7.3  --  7.7  NEUTROABS 4.3  --   --   HGB 12.9* 13.3 12.2*  HCT 38.0* 39.0 35.6*  MCV 93.6  --  92.5  PLT 162  --  150     Coagulation Studies: Recent Labs    05/26/23 1511  LABPROT 14.3  INR 1.1     ASSESSMENT AND PLAN: 78 y.o. male who presents with new onset focal status epilepticus.  He was loaded with Keppra.  I am not certain if he will tolerate an MRI, but I think would be worthwhile to give it a try, though I would not favor further sedation.   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

## 2023-05-29 DIAGNOSIS — G40901 Epilepsy, unspecified, not intractable, with status epilepticus: Secondary | ICD-10-CM | POA: Diagnosis not present

## 2023-05-29 DIAGNOSIS — R410 Disorientation, unspecified: Secondary | ICD-10-CM

## 2023-05-29 DIAGNOSIS — R569 Unspecified convulsions: Secondary | ICD-10-CM | POA: Diagnosis not present

## 2023-05-29 LAB — COMPREHENSIVE METABOLIC PANEL
ALT: 22 U/L (ref 0–44)
AST: 52 U/L — ABNORMAL HIGH (ref 15–41)
Albumin: 3.6 g/dL (ref 3.5–5.0)
Alkaline Phosphatase: 60 U/L (ref 38–126)
Anion gap: 13 (ref 5–15)
BUN: 8 mg/dL (ref 8–23)
CO2: 26 mmol/L (ref 22–32)
Calcium: 9.7 mg/dL (ref 8.9–10.3)
Chloride: 102 mmol/L (ref 98–111)
Creatinine, Ser: 1.08 mg/dL (ref 0.61–1.24)
GFR, Estimated: >60 mL/min (ref >60)
Glucose, Bld: 89 mg/dL (ref 70–99)
Potassium: 3.4 mmol/L — ABNORMAL LOW (ref 3.5–5.1)
Sodium: 141 mmol/L (ref 135–145)
Total Bilirubin: 0.9 mg/dL (ref 0.0–1.2)
Total Protein: 6 g/dL — ABNORMAL LOW (ref 6.5–8.1)

## 2023-05-29 LAB — CBC WITH DIFFERENTIAL/PLATELET
Abs Immature Granulocytes: 0.01 10*3/uL (ref 0.00–0.07)
Basophils Absolute: 0.1 10*3/uL (ref 0.0–0.1)
Basophils Relative: 1 %
Eosinophils Absolute: 0.3 10*3/uL (ref 0.0–0.5)
Eosinophils Relative: 4 %
HCT: 38.6 % — ABNORMAL LOW (ref 39.0–52.0)
Hemoglobin: 13.5 g/dL (ref 13.0–17.0)
Immature Granulocytes: 0 %
Lymphocytes Relative: 25 %
Lymphs Abs: 1.9 10*3/uL (ref 0.7–4.0)
MCH: 31.6 pg (ref 26.0–34.0)
MCHC: 35 g/dL (ref 30.0–36.0)
MCV: 90.4 fL (ref 80.0–100.0)
Monocytes Absolute: 0.6 10*3/uL (ref 0.1–1.0)
Monocytes Relative: 7 %
Neutro Abs: 4.9 10*3/uL (ref 1.7–7.7)
Neutrophils Relative %: 63 %
Platelets: 168 10*3/uL (ref 150–400)
RBC: 4.27 MIL/uL (ref 4.22–5.81)
RDW: 13.9 % (ref 11.5–15.5)
WBC: 7.7 10*3/uL (ref 4.0–10.5)
nRBC: 0 % (ref 0.0–0.2)

## 2023-05-29 LAB — PHOSPHORUS: Phosphorus: 2.5 mg/dL (ref 2.5–4.6)

## 2023-05-29 LAB — LIPID PANEL
Cholesterol: 132 mg/dL (ref 0–200)
HDL: 48 mg/dL (ref >40)
LDL Cholesterol: 63 mg/dL (ref 0–99)
Total CHOL/HDL Ratio: 2.8 ratio
Triglycerides: 107 mg/dL (ref <150)
VLDL: 21 mg/dL (ref 0–40)

## 2023-05-29 LAB — AMMONIA: Ammonia: 25 umol/L (ref 9–35)

## 2023-05-29 LAB — BRAIN NATRIURETIC PEPTIDE: B Natriuretic Peptide: 90 pg/mL (ref 0.0–100.0)

## 2023-05-29 LAB — MAGNESIUM: Magnesium: 1.6 mg/dL — ABNORMAL LOW (ref 1.7–2.4)

## 2023-05-29 MED ORDER — HALOPERIDOL LACTATE 5 MG/ML IJ SOLN
2.0000 mg | Freq: Four times a day (QID) | INTRAMUSCULAR | Status: DC | PRN
  Administered 2023-06-07: 2 mg via INTRAMUSCULAR
  Filled 2023-05-29: qty 1

## 2023-05-29 MED ORDER — ASPIRIN 81 MG PO TBEC
81.0000 mg | DELAYED_RELEASE_TABLET | Freq: Every day | ORAL | Status: DC
  Administered 2023-05-29 – 2023-06-08 (×11): 81 mg via ORAL
  Filled 2023-05-29 (×12): qty 1

## 2023-05-29 MED ORDER — QUETIAPINE FUMARATE 25 MG PO TABS
25.0000 mg | ORAL_TABLET | Freq: Two times a day (BID) | ORAL | Status: DC
  Administered 2023-05-29 – 2023-06-02 (×8): 25 mg via ORAL
  Filled 2023-05-29 (×8): qty 1

## 2023-05-29 MED ORDER — MAGNESIUM SULFATE 4 GM/100ML IV SOLN
4.0000 g | Freq: Once | INTRAVENOUS | Status: AC
  Administered 2023-05-29: 4 g via INTRAVENOUS
  Filled 2023-05-29: qty 100

## 2023-05-29 MED ORDER — POTASSIUM CHLORIDE 20 MEQ PO PACK
40.0000 meq | PACK | Freq: Once | ORAL | Status: AC
  Administered 2023-05-29: 40 meq
  Filled 2023-05-29: qty 2

## 2023-05-29 MED ORDER — DEXTROSE 5 % IV SOLN
1500.0000 mg | Freq: Once | INTRAVENOUS | Status: AC
  Administered 2023-05-29: 1500 mg via INTRAVENOUS
  Filled 2023-05-29: qty 15

## 2023-05-29 MED ORDER — DIVALPROEX SODIUM 500 MG PO DR TAB
750.0000 mg | DELAYED_RELEASE_TABLET | Freq: Two times a day (BID) | ORAL | Status: DC
  Administered 2023-05-29 – 2023-06-08 (×21): 750 mg via ORAL
  Filled 2023-05-29 (×22): qty 1

## 2023-05-29 NOTE — Progress Notes (Signed)
 OT Cancellation Note  Patient Details Name: Danny Patterson MRN: [id number] DOB: 1945-05-03   Cancelled Treatment:    Reason Eval/Treat Not Completed: (P) Fatigue/lethargy limiting ability to participate, Pt sleeping heavily, not able to stay awake long enough to participate, will return tomorrow.  Alexis Goodell 05/29/2023, 4:39 PM

## 2023-05-29 NOTE — Plan of Care (Signed)
 Progressing slowly

## 2023-05-29 NOTE — Plan of Care (Signed)
 Patient is calm. Patient is progressing slowly.

## 2023-05-29 NOTE — Progress Notes (Addendum)
 [facility] PROGRESS NOTE   Danny Patterson ZOX:[id number] DOB: March 22, 1946 DOA: 05/26/2023  PCP: Smitty Cords, DO  Brief History:  78 y.o. male with medical history significant of HTN, stable BPH, could not tolerate Flomax, gout, lumbar fusion, history of kidney stone presents from home with syncopal episode.  He was found to have left-sided gaze preference and left arm and leg weakness.  Presented as a code stroke.  Apparently also had a shaking episode.  Seen by neurology.  Concern was for seizure activity.  He was hospitalized for further management.   Consultants: Neurology  Procedures: Continuous EEG shows nonspecific encephalopathy.  No active seizures.    Subjective/Interval History: Patient in bed, somewhat confused, denies any headache no chest pain or shortness of breath.   Assessment/Plan:  Seizure/acute metabolic encephalopathy Thought to have seizure activity.  Did have left-sided symptoms.  Initially thought to be stroke. CT head did not show any acute findings.  Encephalomalacia was noted.  CT angiogram head and neck did not show any large vessel occlusion.  Neurology has requested MRI of the brain which will be done along with echocardiogram. Neurology on board Case discussed with neurology on 05/29/2023, since he is getting delirium Keppra has been switched to Depakote, as needed Seroquel and Haldol.  Continuous EEG does not show any active seizures. Continue supportive care, PT OT.  Chronic kidney disease stage IIIa No old labs available.  Creatinine was 1.67 on admission.  Noted to be 1.33 yesterday.  Labs pending from today.  Monitor urine output.  Avoid nephrotoxic agents.  Normocytic anemia No evidence of overt bleeding.  Continue to monitor.  Essential hypertension Noted to be on amlodipine.  Occasional high readings noted which could be due to agitation.  Continue to monitor.  Hyperlipidemia Continue with statin.  Hypokalemia and  hypomagnesemia.  Replaced.    History of gout Continue with allopurinol.  Colchicine on hold.  Obesity Estimated body mass index is 35.4 kg/m as calculated from the following:   Height as of this encounter: 5\' 10"  (1.778 m).   Weight as of this encounter: 111.9 kg.  DVT Prophylaxis: Subcutaneous heparin Code Status: Full code Family Communication: Previous MD discussed with her daughter over the phone on 05/28/2022 Disposition Plan: To be determined.  PT OT SLP evaluation  Status is: Inpatient Remains inpatient appropriate because: Acute encephalopathy, concern for seizure activity      Medications: Scheduled:  allopurinol  300 mg Oral Daily   aspirin EC  81 mg Oral Daily   divalproex  750 mg Oral Q12H   heparin  5,000 Units Subcutaneous Q8H   multivitamin with minerals  1 tablet Oral Daily   potassium chloride  40 mEq Per Tube Once   QUEtiapine  25 mg Oral BID   Continuous:  lactated ringers 100 mL/hr at 05/28/23 0139   magnesium sulfate bolus IVPB     valproate sodium 1,500 mg (05/29/23 1111)   WJX:BJYNWGNFAOZHY **OR** acetaminophen, albuterol, baclofen, haloperidol lactate, hydrALAZINE, HYDROcodone-acetaminophen, LORazepam, ondansetron **OR** ondansetron (ZOFRAN) IV     Objective:  Vital Signs  Vitals:   05/28/23 0447 05/28/23 0457 05/28/23 0748 05/28/23 1954  BP: (!) 173/83 (!) 152/98 (!) 152/98 (!) 173/78  Pulse:  70 75   Resp:  18 (!) 23   Temp:  (!) 97.5 F (36.4 C) (!) 97.5 F (36.4 C)   TempSrc:  Oral Oral   SpO2:  100% 99%   Weight:      Height:  Intake/Output Summary (Last 24 hours) at 05/29/2023 1205 Last data filed at 05/29/2023 0533 Gross per 24 hour  Intake --  Output 2300 ml  Net -2300 ml   Filed Weights   05/26/23 1500 05/26/23 1608 05/27/23 0455  Weight: 107.2 kg 107.5 kg 111.9 kg    Exam  Awake but thoroughly confused, No new F.N deficits, Normal affect [place].AT,PERRAL Supple Neck, No JVD,   Symmetrical Chest wall movement,  Good air movement bilaterally, CTAB RRR,No Gallops, Rubs or new Murmurs,  +ve B.Sounds, Abd Soft, No tenderness,   No Cyanosis, Clubbing or edema     Lab Results:  Data Reviewed: I have personally reviewed following labs and reports of the imaging studies  CBC: Recent Labs  Lab 05/26/23 1511 05/26/23 1515 05/27/23 0546 05/29/23 0847  WBC 7.3  --  7.7 7.7  NEUTROABS 4.3  --   --  4.9  HGB 12.9* 13.3 12.2* 13.5  HCT 38.0* 39.0 35.6* 38.6*  MCV 93.6  --  92.5 90.4  PLT 162  --  150 168    Basic Metabolic Panel: Recent Labs  Lab 05/26/23 1511 05/26/23 1515 05/27/23 0546 05/29/23 0847  NA 138 137 135 141  K 3.7 3.7 3.8 3.4*  CL 104 104 105 102  CO2 21*  --  25 26  GLUCOSE 91 86 124* 89  BUN 25* 25* 20 8  CREATININE 1.67* 1.90* 1.33* 1.08  CALCIUM 9.2  --  8.7* 9.7  MG  --   --   --  1.6*  PHOS  --   --   --  2.5    GFR: Estimated Creatinine Clearance: 70.6 mL/min (by C-G formula based on SCr of 1.08 mg/dL).  Liver Function Tests: Recent Labs  Lab 05/26/23 1511 05/27/23 0546 05/29/23 0847  AST 38 33 52*  ALT 19 19 22   ALKPHOS 74 66 60  BILITOT 0.7 0.5 0.9  PROT 6.2* 5.9* 6.0*  ALBUMIN 3.7 3.4* 3.6     Coagulation Profile: Recent Labs  Lab 05/26/23 1511  INR 1.1    CBG: Recent Labs  Lab 05/26/23 1510  GLUCAP 80     Radiology Studies: No results found.      LOS: 2 days   Signature  -    Susa Raring M.D on 05/29/2023 at 12:05 PM   -  To page go to [email or web address]

## 2023-05-29 NOTE — Plan of Care (Signed)

## 2023-05-29 NOTE — Progress Notes (Addendum)
 Subjective: No further seizures  Examination  Vital signs in last 24 hours: BP: (173)/(78) 173/78 (03/08 1954)  General: lying in bed, NAD Neuro: Awake, alert, not oriented.  When I ask him where he is, he states "I am not really familiar with this place but if you go out the door there is an office down some stairs little ways later."  Speech is fluent and he is able to name objects.  He is able to move all extremities well, endorses symmetric sensation   Basic Metabolic Panel: Recent Labs  Lab 05/26/23 1511 05/26/23 1515 05/27/23 0546  NA 138 137 135  K 3.7 3.7 3.8  CL 104 104 105  CO2 21*  --  25  GLUCOSE 91 86 124*  BUN 25* 25* 20  CREATININE 1.67* 1.90* 1.33*  CALCIUM 9.2  --  8.7*    CBC: Recent Labs  Lab 05/26/23 1511 05/26/23 1515 05/27/23 0546  WBC 7.3  --  7.7  NEUTROABS 4.3  --   --   HGB 12.9* 13.3 12.2*  HCT 38.0* 39.0 35.6*  MCV 93.6  --  92.5  PLT 162  --  150     Coagulation Studies: Recent Labs    05/26/23 1511  LABPROT 14.3  INR 1.1     ASSESSMENT AND PLAN: 78 y.o. male who presents with new onset focal status epilepticus.  He continues to be quite delirious, and does admit to drinking about six beers a day.  I will check a thiamine level and start high-dose thiamine.  In addition, Keppra can sometimes to be deliriogenic and therefore I will change from Keppra to Depakote.  New onset status epilepticus due to underlying stroke -No evidence of infection, electrolytes, blood glucose wnl  Recommendations - Discontinue levetiracetam - Start valproic acid, load with 1500 mg then 750 twice daily - MRI brain pending - asa 81mg  daily given old strokes, will also check echo and lipids given his strokes have never been worked up before.  - check thiamine, then start supplementation - Neurology will continue to follow  Ritta Slot, MD [facility]   If 7pm- 7am, please page neurology on call as listed in AMION.

## 2023-05-30 ENCOUNTER — Inpatient Hospital Stay (HOSPITAL_COMMUNITY)

## 2023-05-30 DIAGNOSIS — R569 Unspecified convulsions: Secondary | ICD-10-CM | POA: Diagnosis not present

## 2023-05-30 DIAGNOSIS — I10 Essential (primary) hypertension: Secondary | ICD-10-CM | POA: Diagnosis not present

## 2023-05-30 DIAGNOSIS — G40901 Epilepsy, unspecified, not intractable, with status epilepticus: Secondary | ICD-10-CM | POA: Diagnosis not present

## 2023-05-30 DIAGNOSIS — G9340 Encephalopathy, unspecified: Secondary | ICD-10-CM | POA: Diagnosis not present

## 2023-05-30 LAB — CBC WITH DIFFERENTIAL/PLATELET
Abs Immature Granulocytes: 0.01 10*3/uL (ref 0.00–0.07)
Basophils Absolute: 0.1 10*3/uL (ref 0.0–0.1)
Basophils Relative: 1 %
Eosinophils Absolute: 0.3 10*3/uL (ref 0.0–0.5)
Eosinophils Relative: 5 %
HCT: 37.5 % — ABNORMAL LOW (ref 39.0–52.0)
Hemoglobin: 13 g/dL (ref 13.0–17.0)
Immature Granulocytes: 0 %
Lymphocytes Relative: 19 %
Lymphs Abs: 1.3 10*3/uL (ref 0.7–4.0)
MCH: 31.9 pg (ref 26.0–34.0)
MCHC: 34.7 g/dL (ref 30.0–36.0)
MCV: 92.1 fL (ref 80.0–100.0)
Monocytes Absolute: 0.5 10*3/uL (ref 0.1–1.0)
Monocytes Relative: 7 %
Neutro Abs: 4.7 10*3/uL (ref 1.7–7.7)
Neutrophils Relative %: 68 %
Platelets: 165 10*3/uL (ref 150–400)
RBC: 4.07 MIL/uL — ABNORMAL LOW (ref 4.22–5.81)
RDW: 14 % (ref 11.5–15.5)
WBC: 6.9 10*3/uL (ref 4.0–10.5)
nRBC: 0 % (ref 0.0–0.2)

## 2023-05-30 LAB — COMPREHENSIVE METABOLIC PANEL
ALT: 22 U/L (ref 0–44)
AST: 46 U/L — ABNORMAL HIGH (ref 15–41)
Albumin: 3.2 g/dL — ABNORMAL LOW (ref 3.5–5.0)
Alkaline Phosphatase: 62 U/L (ref 38–126)
Anion gap: 12 (ref 5–15)
BUN: 13 mg/dL (ref 8–23)
CO2: 27 mmol/L (ref 22–32)
Calcium: 9.4 mg/dL (ref 8.9–10.3)
Chloride: 100 mmol/L (ref 98–111)
Creatinine, Ser: 1.21 mg/dL (ref 0.61–1.24)
GFR, Estimated: >60 mL/min (ref >60)
Glucose, Bld: 90 mg/dL (ref 70–99)
Potassium: 3.8 mmol/L (ref 3.5–5.1)
Sodium: 139 mmol/L (ref 135–145)
Total Bilirubin: 0.9 mg/dL (ref 0.0–1.2)
Total Protein: 5.9 g/dL — ABNORMAL LOW (ref 6.5–8.1)

## 2023-05-30 LAB — ECHOCARDIOGRAM COMPLETE
AR max vel: 2.46 cm2
AV Area VTI: 2.33 cm2
AV Area mean vel: 2.25 cm2
AV Mean grad: 4 mmHg
AV Peak grad: 7.2 mmHg
Ao pk vel: 1.34 m/s
Area-P 1/2: 2.87 cm2
Calc EF: 63.6 %
Height: 70 in
S' Lateral: 2.7 cm
Single Plane A2C EF: 65.2 %
Single Plane A4C EF: 64 %
Weight: 3947.12 [oz_av]

## 2023-05-30 LAB — VALPROIC ACID LEVEL: Valproic Acid Lvl: 68 ug/mL (ref 50.0–100.0)

## 2023-05-30 LAB — BRAIN NATRIURETIC PEPTIDE: B Natriuretic Peptide: 64.8 pg/mL (ref 0.0–100.0)

## 2023-05-30 LAB — AMMONIA: Ammonia: 16 umol/L (ref 9–35)

## 2023-05-30 LAB — MAGNESIUM: Magnesium: 2.3 mg/dL (ref 1.7–2.4)

## 2023-05-30 LAB — PHOSPHORUS: Phosphorus: 3.6 mg/dL (ref 2.5–4.6)

## 2023-05-30 MED ORDER — PERFLUTREN LIPID MICROSPHERE
1.0000 mL | INTRAVENOUS | Status: AC | PRN
  Administered 2023-05-30: 3 mL via INTRAVENOUS

## 2023-05-30 MED ORDER — LORAZEPAM 2 MG/ML IJ SOLN
1.0000 mg | Freq: Once | INTRAMUSCULAR | Status: AC | PRN
  Administered 2023-05-30: 1 mg via INTRAVENOUS
  Filled 2023-05-30: qty 1

## 2023-05-30 MED ORDER — TRAMADOL HCL 50 MG PO TABS: 50.0000 mg | ORAL_TABLET | Freq: Four times a day (QID) | ORAL | Status: DC | PRN

## 2023-05-30 NOTE — Progress Notes (Signed)
 Echocardiogram 2D Echocardiogram has been performed.  [name] [name] 05/30/2023, 11:51 AM

## 2023-05-30 NOTE — Consult Note (Signed)
 Physical Medicine and Rehabilitation Consult Reason for Consult: Mental status changes Referring Physician: Thedore Mins   HPI: Danny Patterson is a 78 y.o. male with prior history of hypertension, benign prostatic hyperplasia, lumbar pain status post lumbar fusion who was admitted on 05/26/2023 with mental status changes.  CT scan was negative, patient did have shaking and was diagnosed with seizures by neurology although EEG on 05/27/2023 was negative.  The patient gave a history of drinking approximately 6 pack/day and was started on thiamine supplementation by neurology. The patient has been evaluated by physical therapy and was found to be a minimal to a maximum assist from sit to stand and ambulates mod assist 20 feet with a rolling walker.  An OT eval was attempted on 05/29/2023 but patient was too somnolent for evaluation.  The patient was seen by speech therapy who evaluated for dysphagia, swallowing looked intact and patient was placed on a regular diet with thin liquids  Patient states he has chronic low back pain since lumbar surgery years ago  Review of Systems  Constitutional:  Negative for chills and fever.  HENT:  Negative for ear discharge and nosebleeds.   Eyes:  Negative for pain and redness.  Respiratory:  Negative for cough, hemoptysis, wheezing and stridor.   Cardiovascular:  Negative for chest pain and palpitations.  Gastrointestinal:  Negative for abdominal pain, nausea and vomiting.  Genitourinary:  Positive for frequency. Negative for dysuria.  Musculoskeletal:  Positive for back pain.  Skin:  Negative for rash.  Neurological:  Positive for dizziness and seizures.  Endo/Heme/Allergies:  Does not bruise/bleed easily.  Psychiatric/Behavioral:  Positive for substance abuse. Negative for hallucinations. The patient is not nervous/anxious.    History reviewed. No pertinent past medical history.  History reviewed. No pertinent family history. Social History:  has no history on  file for tobacco use, alcohol use, and drug use. Allergies:  Allergies  Allergen Reactions   Ace Inhibitors Anaphylaxis    Mouth swelling    Hydrocodone Other (See Comments)    Patient began reaching for objects that are not there and restlessness   Medications Prior to Admission  Medication Sig Dispense Refill   allopurinol (ZYLOPRIM) 300 MG tablet Take 300 mg by mouth daily.     ALPHA LIPOIC ACID PO Take 1 tablet by mouth daily.     amLODipine (NORVASC) 5 MG tablet Take 5 mg by mouth daily.     atorvastatin (LIPITOR) 20 MG tablet Take 20 mg by mouth daily.     baclofen (LIORESAL) 10 MG tablet Take 10 mg by mouth daily as needed for muscle spasms.     colchicine 0.6 MG tablet Take 0.6 mg by mouth daily as needed (for gout).     furosemide (LASIX) 20 MG tablet Take 20 mg by mouth daily as needed for fluid.     Multiple Vitamin (MULTIVITAMIN WITH MINERALS) TABS tablet Take 1 tablet by mouth daily.     Potassium 99 MG TABS Take by mouth.     potassium citrate (UROCIT-K) 10 MEQ (1080 MG) SR tablet Take 10 mEq by mouth daily.      Home: Home Living Family/patient expects to be discharged to:: Private residence Living Arrangements: Children, Spouse/significant other Available Help at Discharge: Family Type of Home: House Home Layout: One level Bathroom Shower/Tub: Walk-in shower Home Equipment: Gilmer Mor - single point Additional Comments: limited home setup info due to pt lethargy  Functional History: Prior Function Prior Level of Function : Patient  poor historian/Family not available Mobility Comments: limited community ambulator, uses cane vs no AD, no recent falls ADLs Comments: reports able to manage ADLs. intermittent shower transfer assist per PT eval Functional Status:  Mobility: Bed Mobility Overal bed mobility: Needs Assistance Bed Mobility: Supine to Sit, Sit to Supine Supine to sit: Mod assist, HOB elevated, Used rails Sit to supine: Mod assist General bed mobility  comments: assist to initiate bringing LE to EOB Transfers Overall transfer level: Needs assistance Equipment used: None Transfers: Sit to/from Stand Sit to Stand: Max assist General transfer comment: Max A to stand at bedside with handheld assist. Pt deferred RW for this attempt and noted to reach for sink near bed to pull up from. Cued to hold to therapist instead of safety. Took a couple of very unsteady steps at bedside Ambulation/Gait Ambulation/Gait assistance: Mod assist, +2 safety/equipment Gait Distance (Feet): 20 Feet Assistive device: Rolling walker (2 wheels) Gait Pattern/deviations: Step-through pattern, Decreased stride length General Gait Details: Crouched posture, pt with poor positioning on inside of walker, max facilitation and cueing for turns and sequencing Gait velocity: decreased Gait velocity interpretation: <1.31 ft/sec, indicative of household ambulator    ADL: ADL Overall ADL's : Needs assistance/impaired Eating/Feeding: Sitting, Minimal assistance Eating/Feeding Details (indicate cue type and reason): OT assisting to hold cup and bring to mouth Grooming: Moderate assistance, Minimal assistance, Sitting, Wash/dry face Grooming Details (indicate cue type and reason): decreased initiation w/ task when cued EOB but did request washcloth once back in bed and able to wash face with it using RUE Upper Body Bathing: Moderate assistance, Sitting, Bed level Lower Body Bathing: Maximal assistance, Sitting/lateral leans, Bed level Upper Body Dressing : Moderate assistance, Sitting, Bed level Lower Body Dressing: Total assistance, Bed level, Sitting/lateral leans Toileting- Clothing Manipulation and Hygiene: Total assistance, Bed level  Cognition: Cognition Orientation Level: Oriented to person, Oriented to place, Disoriented to time, Disoriented to situation Cognition Arousal: Lethargic Behavior During Therapy: Flat affect, Lability  Blood pressure 121/72, pulse 60,  temperature (!) 97.5 F (36.4 C), temperature source Oral, resp. rate 15, height 5\' 10"  (1.778 m), weight 111.9 kg, SpO2 100%. Physical Exam  General: No acute distress Mood and affect are appropriate Heart: Regular rate and rhythm no rubs murmurs or extra sounds Lungs: Clear to auscultation, breathing unlabored, no rales or wheezes Abdomen: Positive bowel sounds, soft nontender to palpation, nondistended Extremities: No clubbing, cyanosis, or edema Skin: No evidence of breakdown, no evidence of rash Oriented to person, place, month and year but not date and date Neurologic: Cranial nerves II through XII intact, motor strength is  5/5 in bilateral deltoid, bicep, tricep, grip, 3- hip flexor, knee extensors, ankle dorsiflexor and plantar flexor Sensory exam normal sensation to light touch  in bilateral upper and lower extremities Cerebellar exam normal finger to nose to finger Musculoskeletal: Mildly reduced left shoulder forward flexion and abduction, negative impingement sign. No joint swelling  Results for orders placed or performed during the hospital encounter of 05/26/23 (from the past 24 hours)  CBC with Differential/Platelet     Status: Abnormal   Collection Time: 05/30/23  5:25 AM  Result Value Ref Range   WBC 6.9 4.0 - 10.5 K/uL   RBC 4.07 (L) 4.22 - 5.81 MIL/uL   Hemoglobin 13.0 13.0 - 17.0 g/dL   HCT 16.1 (L) 09.6 - 04.5 %   MCV 92.1 80.0 - 100.0 fL   MCH 31.9 26.0 - 34.0 pg   MCHC 34.7 30.0 - 36.0 g/dL  RDW 14.0 11.5 - 15.5 %   Platelets 165 150 - 400 K/uL   nRBC 0.0 0.0 - 0.2 %   Neutrophils Relative % 68 %   Neutro Abs 4.7 1.7 - 7.7 K/uL   Lymphocytes Relative 19 %   Lymphs Abs 1.3 0.7 - 4.0 K/uL   Monocytes Relative 7 %   Monocytes Absolute 0.5 0.1 - 1.0 K/uL   Eosinophils Relative 5 %   Eosinophils Absolute 0.3 0.0 - 0.5 K/uL   Basophils Relative 1 %   Basophils Absolute 0.1 0.0 - 0.1 K/uL   Immature Granulocytes 0 %   Abs Immature Granulocytes 0.01 0.00 -  0.07 K/uL  Magnesium     Status: None   Collection Time: 05/30/23  5:25 AM  Result Value Ref Range   Magnesium 2.3 1.7 - 2.4 mg/dL  Comprehensive metabolic panel     Status: Abnormal   Collection Time: 05/30/23  5:25 AM  Result Value Ref Range   Sodium 139 135 - 145 mmol/L   Potassium 3.8 3.5 - 5.1 mmol/L   Chloride 100 98 - 111 mmol/L   CO2 27 22 - 32 mmol/L   Glucose, Bld 90 70 - 99 mg/dL   BUN 13 8 - 23 mg/dL   Creatinine, Ser 6.29 0.61 - 1.24 mg/dL   Calcium 9.4 8.9 - 52.8 mg/dL   Total Protein 5.9 (L) 6.5 - 8.1 g/dL   Albumin 3.2 (L) 3.5 - 5.0 g/dL   AST 46 (H) 15 - 41 U/L   ALT 22 0 - 44 U/L   Alkaline Phosphatase 62 38 - 126 U/L   Total Bilirubin 0.9 0.0 - 1.2 mg/dL   GFR, Estimated >41 >32 mL/min   Anion gap 12 5 - 15  Brain natriuretic peptide     Status: None   Collection Time: 05/30/23  5:25 AM  Result Value Ref Range   B Natriuretic Peptide 64.8 0.0 - 100.0 pg/mL  Ammonia     Status: None   Collection Time: 05/30/23  5:25 AM  Result Value Ref Range   Ammonia 16 9 - 35 umol/L  Phosphorus     Status: None   Collection Time: 05/30/23  5:25 AM  Result Value Ref Range   Phosphorus 3.6 2.5 - 4.6 mg/dL  Valproic acid level     Status: None   Collection Time: 05/30/23  5:25 AM  Result Value Ref Range   Valproic Acid Lvl 68 50.0 - 100.0 ug/mL   DG Chest Port 1 View Result Date: 05/30/2023 CLINICAL DATA:  Shortness of breath.  Stroke. EXAM: PORTABLE CHEST 1 VIEW COMPARISON:  03/29/2022 FINDINGS: Stable cardiomediastinal contours. Aortic atherosclerosis. Asymmetric elevation of right hemidiaphragm, unchanged. Increase opacity within the retrocardiac left lower lobe which may reflect atelectasis or airspace disease. Visualized osseous structures are notable for remote healed left clavicle fracture. No acute osseous findings. IMPRESSION: Increase opacity within the retrocardiac left lower lobe which may reflect atelectasis or airspace disease. Electronically Signed   By:  Signa Kell M.D.   On: 05/30/2023 06:30    Assessment/Plan: Diagnosis: Encephalopathy versus CVA, MRI pending Does the need for close, 24 hr/day medical supervision in concert with the patient's rehab needs make it unreasonable for this patient to be served in a less intensive setting? Potentially Co-Morbidities requiring supervision/potential complications:  -Hypertension, seizure disorder Due to bladder management, bowel management, safety, skin/wound care, disease management, medication administration, pain management, and patient education, does the patient require 24 hr/day rehab nursing?  Yes Does the patient require coordinated care of a physician, rehab nurse, therapy disciplines of PT, OT, speech to address physical and functional deficits in the context of the above medical diagnosis(es)? Yes Addressing deficits in the following areas: balance, endurance, locomotion, strength, transferring, bowel/bladder control, bathing, dressing, toileting, cognition, and psychosocial support Can the patient actively participate in an intensive therapy program of at least 3 hrs of therapy per day at least 5 days per week? Yes The potential for patient to make measurable gains while on inpatient rehab is good Anticipated functional outcomes upon discharge from inpatient rehab are supervision  with PT, supervision with OT, supervision with SLP. Estimated rehab length of stay to reach the above functional goals is: 10 to 14 days Anticipated discharge destination:  Home with assist from daughter Overall Rehab/Functional Prognosis: good  POST ACUTE RECOMMENDATIONS: This patient's condition is appropriate for continued rehabilitative care in the following setting: CIR Patient has agreed to participate in recommended program. Yes Note that insurance prior authorization may be required for reimbursement for recommended care.  Comment: Need neuro to review MRI of the brain and make further recommendations  prior to rehab   MEDICAL RECOMMENDATIONS: Recommend speech-language pathology to assess cognition, initial eval focused on swallowing   I have personally performed a face to face diagnostic evaluation of this patient. Additionally, I have examined the patient's medical record including any pertinent labs and radiographic images.    Thanks,  Erick Colace, MD 05/30/2023

## 2023-05-30 NOTE — Evaluation (Signed)
 Occupational Therapy Evaluation Patient Details Name: Danny Patterson MRN: [id number] DOB: 1945/10/22 Today's Date: 05/30/2023   History of Present Illness   Pt is a 78 y.o. M who presents with left sided gaze preference and weakness. Presented as code stroke. CT head negative for acute abnormality. Now being worked up for seizure. Significant PMH: HTN, stable BPH, gout, lumbar fusion, history of kidney stone.     Clinical Impressions PTA, pt lives with family, typically Modified Independent with ADLs and mobility using cane vs no AD. Pt presents now with deficits in cognition, balance, LUE strength/coordination and endurance. Pt lethargic on entry though did awaken more with mobility. Overall, pt requires Mod A for bed mobility, Max A for standing at bedside, Mod A for UB ADL and up to Total A for LB ADLs. Pt showing insight into weakness and cognitive deficits during session, tearful at times. Based on high PLOF and rehab potential, recommend intensive rehab services at [facility].     If plan is discharge home, recommend the following:   A lot of help with walking and/or transfers;Two people to help with walking and/or transfers;A lot of help with bathing/dressing/bathroom;Two people to help with bathing/dressing/bathroom     Functional Status Assessment   Patient has had a recent decline in their functional status and demonstrates the ability to make significant improvements in function in a reasonable and predictable amount of time.     Equipment Recommendations   Other (comment) (TBD pending progress)     Recommendations for Other Services   Rehab consult     Precautions/Restrictions   Precautions Precautions: Fall;Other (comment) Restrictions Weight Bearing Restrictions Per Provider Order: No     Mobility Bed Mobility Overal bed mobility: Needs Assistance Bed Mobility: Supine to Sit, Sit to Supine     Supine to sit: Mod assist, HOB elevated, Used rails Sit to supine:  Mod assist   General bed mobility comments: assist to initiate bringing LE to EOB    Transfers Overall transfer level: Needs assistance Equipment used: None Transfers: Sit to/from Stand Sit to Stand: Max assist           General transfer comment: Max A to stand at bedside with handheld assist. Pt deferred RW for this attempt and noted to reach for sink near bed to pull up from. Cued to hold to therapist instead of safety. Took a couple of very unsteady steps at bedside      Balance Overall balance assessment: Needs assistance Sitting-balance support: Feet supported Sitting balance-Leahy Scale: Fair     Standing balance support: Bilateral upper extremity supported Standing balance-Leahy Scale: Poor                             ADL either performed or assessed with clinical judgement   ADL Overall ADL's : Needs assistance/impaired Eating/Feeding: Sitting;Minimal assistance Eating/Feeding Details (indicate cue type and reason): OT assisting to hold cup and bring to mouth Grooming: Moderate assistance;Minimal assistance;Sitting;Wash/dry face Grooming Details (indicate cue type and reason): decreased initiation w/ task when cued EOB but did request washcloth once back in bed and able to wash face with it using RUE Upper Body Bathing: Moderate assistance;Sitting;Bed level   Lower Body Bathing: Maximal assistance;Sitting/lateral leans;Bed level   Upper Body Dressing : Moderate assistance;Sitting;Bed level   Lower Body Dressing: Total assistance;Bed level;Sitting/lateral leans       Toileting- Clothing Manipulation and Hygiene: Total assistance;Bed level  Vision Ability to See in Adequate Light: 1 Impaired Patient Visual Report: Other (comment) (to be further assessed when less lethargic) Vision Assessment?: No apparent visual deficits     Perception         Praxis         Pertinent Vitals/Pain Pain Assessment Pain Assessment:  Faces Faces Pain Scale: Hurts a little bit Pain Location: chronic back pain Pain Descriptors / Indicators: Grimacing, Guarding Pain Intervention(s): Monitored during session, Repositioned     Extremity/Trunk Assessment Upper Extremity Assessment Upper Extremity Assessment: Right hand dominant;LUE deficits/detail LUE Deficits / Details: does not actively use this UE during task without initiation assist. able to squeeze, push and pull with this UE but noted difficulty lifting this UE w shoulder weakness noted LUE Coordination: decreased fine motor;decreased gross motor   Lower Extremity Assessment Lower Extremity Assessment: Defer to PT evaluation   Cervical / Trunk Assessment Cervical / Trunk Assessment: Kyphotic   Communication Communication Communication: Impaired Factors Affecting Communication: Reduced clarity of speech;Difficulty expressing self   Cognition Arousal: Lethargic Behavior During Therapy: Flat affect, Lability Cognition: Difficult to assess Difficult to assess due to: Level of arousal, Impaired communication           OT - Cognition Comments: lethargic initially but awakend fairly easily with mobility. follows one step commands with multimodal cues. lability noted as pt tearful intermittently during session. some slurred speech and pt reported feeling like he was "drunk".                 Following commands: Impaired Following commands impaired: Follows one step commands with increased time, Follows multi-step commands inconsistently     Cueing  General Comments   Cueing Techniques: Verbal cues;Tactile cues;Gestural cues      Exercises     Shoulder Instructions      Home Living Family/patient expects to be discharged to:: Private residence Living Arrangements: Children;Spouse/significant other Available Help at Discharge: Family Type of Home: House       Home Layout: One level     Bathroom Shower/Tub: Walk-in shower         Home  Equipment: [employer] - single point   Additional Comments: limited home setup info due to pt lethargy      Prior Functioning/Environment Prior Level of Function : Patient poor historian/Family not available             Mobility Comments: limited community ambulator, uses cane vs no AD, no recent falls ADLs Comments: reports able to manage ADLs. intermittent shower transfer assist per PT eval    OT Problem List: Decreased strength;Decreased activity tolerance;Impaired balance (sitting and/or standing);Decreased cognition;Decreased safety awareness;Decreased coordination;Impaired vision/perception;Decreased knowledge of use of DME or AE;Decreased knowledge of precautions   OT Treatment/Interventions: Self-care/ADL training;Therapeutic exercise;Energy conservation;DME and/or AE instruction;Therapeutic activities;Patient/family education;Balance training      OT Goals(Current goals can be found in the care plan section)   Acute Rehab OT Goals Patient Stated Goal: not be so sleepy OT Goal Formulation: With patient Time For Goal Achievement: 06/13/23 Potential to Achieve Goals: Good   OT Frequency:  Min 1X/week    Co-evaluation              AM-PAC OT "6 Clicks" Daily Activity     Outcome Measure Help from another person eating meals?: A Little Help from another person taking care of personal grooming?: A Little Help from another person toileting, which includes using toliet, bedpan, or urinal?: Total Help from another person bathing (including  washing, rinsing, drying)?: A Lot Help from another person to put on and taking off regular upper body clothing?: A Lot Help from another person to put on and taking off regular lower body clothing?: Total 6 Click Score: 12   End of Session Equipment Utilized During Treatment: Oxygen Nurse Communication: Mobility status  Activity Tolerance: Patient limited by lethargy Patient left: in bed;with call bell/phone within reach;with bed  alarm set;with restraints reapplied  OT Visit Diagnosis: Other abnormalities of gait and mobility (R26.89);Unsteadiness on feet (R26.81);Muscle weakness (generalized) (M62.81);Other symptoms and signs involving cognitive function                Time: 1610-9604 OT Time Calculation (min): 24 min Charges:  OT General Charges $OT Visit: 1 Visit OT Evaluation $OT Eval Moderate Complexity: 1 Mod OT Treatments $Self Care/Home Management : 8-22 mins  Bradd Canary, OTR/L Acute Rehab Services Office: [phone]   Lorre Munroe 05/30/2023, 10:40 AM

## 2023-05-30 NOTE — Care Management Important Message (Signed)
 Important Message  Patient Details  Name: Danny Patterson MRN: [id number] Date of Birth: 04-Oct-1945   Important Message Given:  Yes - Medicare IM     Dorena Bodo 05/30/2023, 4:16 PM

## 2023-05-30 NOTE — Progress Notes (Signed)
 Subjective: No acute events overnight.  Denies any concerns.  ROS: negative except above  Examination  Vital signs in last 24 hours: Pulse Rate:  [59-68] 60 (03/10 0800) Resp:  [14-15] 15 (03/10 0800) BP: (121-172)/(61-83) 121/72 (03/10 0800) SpO2:  [98 %-100 %] 100 % (03/10 0800)  General: lying in bed, NAD Neuro: Awake, alert, able to tell me his name and that he is at a hospital in [place], not oriented to time, did not stick his tongue out (stated it is impolite), did follow other commands like squeezing my hands, able to name objects, cranial nerves appear grossly intact, 4/5 in all 4 extremities  Basic Metabolic Panel: Recent Labs  Lab 05/26/23 1511 05/26/23 1515 05/27/23 0546 05/29/23 0847 05/30/23 0525  NA 138 137 135 141 139  K 3.7 3.7 3.8 3.4* 3.8  CL 104 104 105 102 100  CO2 21*  --  25 26 27   GLUCOSE 91 86 124* 89 90  BUN 25* 25* 20 8 13   CREATININE 1.67* 1.90* 1.33* 1.08 1.21  CALCIUM 9.2  --  8.7* 9.7 9.4  MG  --   --   --  1.6* 2.3  PHOS  --   --   --  2.5 3.6    CBC: Recent Labs  Lab 05/26/23 1511 05/26/23 1515 05/27/23 0546 05/29/23 0847 05/30/23 0525  WBC 7.3  --  7.7 7.7 6.9  NEUTROABS 4.3  --   --  4.9 4.7  HGB 12.9* 13.3 12.2* 13.5 13.0  HCT 38.0* 39.0 35.6* 38.6* 37.5*  MCV 93.6  --  92.5 90.4 92.1  PLT 162  --  150 168 165     Coagulation Studies: No results for input(s): "LABPROT", "INR" in the last 72 hours.  Imaging MRI brain ordered and pending   ASSESSMENT AND PLAN: 78 y.o. male who presents with new onset focal status epilepticus.  He continues to be quite delirious, and does admit to drinking about six beers a day.  I will check a thiamine level and start high-dose thiamine.  In addition, Keppra can sometimes to be deliriogenic and therefore I will change from Keppra to Depakote.   New onset status epilepticus due to underlying stroke -No evidence of infection, electrolytes, blood glucose wnl   Recommendations -Continue  valproic acid 750 twice daily.  Depakote level 68 with normal ammonia, AST improving and platelets stable - MRI brain pending -Continue aspirin 81 mg daily.  LDL 63. TTE pending -Thiamine levels ordered and pending.  Will hold off on empiric supplementation as mental status improving -Continue seizure precautions -As needed IV Ativan for clinical seizures  I have spent a total of  26 minutes with the patient reviewing hospital notes,  test results, labs and examining the patient as well as establishing an assessment and plan that was discussed personally with the patient.  > 50% of time was spent in direct patient care.         Lindie Spruce Epilepsy [facility] For questions after 5pm please refer to AMION to reach the Neurologist on call

## 2023-05-30 NOTE — Progress Notes (Signed)
 Speech Language Pathology Treatment: Dysphagia  Patient Details Name: Danny Patterson MRN: [id number] DOB: 02-02-1946 Today's Date: 05/30/2023 Time: 4098-1191 SLP Time Calculation (min) (ACUTE ONLY): 21 min  Assessment / Plan / Recommendation Clinical Impression  Pt was adequately awake for lunch, slightly groggy from meds prior to MRI with daughter present. He fed himself needing some assist for scooping/stabbing food. Mastication was timely with trace residue and pt seen to spontaneously use lingual sweep and cues for sips liquid helped clear (also eating eggs for lunch which not uncommon to leave trace residue). Daughter reported pt ate yesterday when not as alert, began to cough after meal and she noted he had some oral residue which she cleaned. Reiterated ensuring his mouth is clear following meals. There were no s/s aspiration with straw sips thin or solids. Daughter reported he was able to masticate pot roast without difficulty on Saturday. Recommend pt continue regular texture, thin liquids, pills with thin when alert pt to clear oral cavity at end of meal and staff/family ensure mouth is clear. No further ST warranted (family orders food that he is able to masticate easily).    HPI HPI: 78 y.o. male with medical history significant of HTN, stable BPH, could not tolerate Flomax, gout, lumbar fusion, history of kidney stone presents from home with syncopal episode.  He was found to have left-sided gaze preference and left arm and leg weakness.  Presented as a code stroke.  Apparently also had a shaking episode. Head CT No acute intracranial abnormality or significant interval change, encephalomalacia involving the posterior and medial right parietal lobe appears remote. Concern is now for seizure activity vs stroke.      SLP Plan  All goals met;Discharge SLP treatment due to (comment)      Recommendations for follow up therapy are one component of a multi-disciplinary discharge planning  process, led by the attending physician.  Recommendations may be updated based on patient status, additional functional criteria and insurance authorization.    Recommendations  Diet recommendations: Regular;Thin liquid Liquids provided via: Straw;Cup Medication Administration: Whole meds with liquid Supervision: Patient able to self feed Compensations: Slow rate;Small sips/bites;Lingual sweep for clearance of pocketing Postural Changes and/or Swallow Maneuvers: Seated upright 90 degrees                  Oral care BID   Intermittent Supervision/Assistance Dysphagia, unspecified (R13.10)     All goals met;Discharge SLP treatment due to (comment)     Royce Macadamia  05/30/2023, 2:09 PM

## 2023-05-30 NOTE — Progress Notes (Signed)

## 2023-05-30 NOTE — Progress Notes (Signed)
 [facility] PROGRESS NOTE   Danny Patterson JYN:[id number] DOB: 27-Jan-1946 DOA: 05/26/2023  PCP: Smitty Cords, DO  Brief History:  78 y.o. male with medical history significant of HTN, stable BPH, could not tolerate Flomax, gout, lumbar fusion, history of kidney stone presents from home with syncopal episode.  He was found to have left-sided gaze preference and left arm and leg weakness.  Presented as a code stroke.  Apparently also had a shaking episode.  Seen by neurology.  Concern was for seizure activity.  He was hospitalized for further management.   Consultants: Neurology  Procedures: Continuous EEG shows nonspecific encephalopathy.  No active seizures.    Subjective: Patient in bed, in no distress, less confused at yesterday, denies any headache chest or abdominal pain.  No weakness.   Assessment/Plan:  Seizure/acute metabolic encephalopathy Thought to have seizure activity.  Did have left-sided symptoms.  Initially thought to be stroke. CT head did not show any acute findings.  Encephalomalacia was noted.  CT angiogram head and neck did not show any large vessel occlusion.  Neurology has requested MRI of the brain which will be done along with echocardiogram. Neurology on board Case discussed with neurology on 05/29/2023, since he is getting delirium Keppra has been switched to Depakote, MRI and TEE today to rule out any possibility of stroke, as needed Seroquel and Haldol.  Continuous EEG does not show any active seizures. Continue supportive care, PT OT.  Chronic kidney disease stage IIIa   No old labs available.  Creatinine was 1.67 on admission.  Noted to be 1.33 yesterday.  Labs pending from today.  Monitor urine output.  Avoid nephrotoxic agents.  Normocytic anemia  No evidence of overt bleeding.  Continue to monitor.  Essential hypertension - Noted to be on amlodipine.  Will add blood pressure medications once MRI done and stroke rule out.  Hyperlipidemia  - Continue with statin.  Hypokalemia and hypomagnesemia.  Replaced.    History of gout   Continue with allopurinol.  Colchicine on hold.  Obesity Estimated body mass index is 35.4 kg/m as calculated from the following:   Height as of this encounter: 5\' 10"  (1.778 m).   Weight as of this encounter: 111.9 kg.  DVT Prophylaxis: Subcutaneous heparin Code Status: Full code Family Communication:  daughter over the phone [phone] on 05/30/2022 Disposition Plan: To be determined.  PT OT SLP evaluation  Status is: Inpatient Remains inpatient appropriate because: Acute encephalopathy, concern for seizure activity  Medications: Scheduled:  allopurinol  300 mg Oral Daily   aspirin EC  81 mg Oral Daily   divalproex  750 mg Oral Q12H   heparin  5,000 Units Subcutaneous Q8H   multivitamin with minerals  1 tablet Oral Daily   QUEtiapine  25 mg Oral BID   Continuous:   XBM:[id number] **OR** acetaminophen, albuterol, haloperidol lactate, hydrALAZINE, LORazepam, ondansetron **OR** ondansetron (ZOFRAN) IV   Objective:  Vital Signs  Vitals:   05/28/23 0748 05/28/23 1954 05/30/23 0000 05/30/23 0411  BP: (!) 152/98 (!) 173/78 (!) 149/61 (!) 172/83  Pulse: 75  (!) 59 68  Resp: (!) 23  15 14   Temp: (!) 97.5 F (36.4 C)     TempSrc: Oral     SpO2: 99%  99% 98%  Weight:      Height:        Intake/Output Summary (Last 24 hours) at 05/30/2023 0951 Last data filed at 05/30/2023 0629 Gross per 24 hour  Intake --  Output 900  ml  Net -900 ml   Filed Weights   05/26/23 1500 05/26/23 1608 05/27/23 0455  Weight: 107.2 kg 107.5 kg 111.9 kg    Exam  Awake minimally confused, No new F.N deficits, Normal affect [place].AT,PERRAL Supple Neck, No JVD,   Symmetrical Chest wall movement, Good air movement bilaterally, CTAB RRR,No Gallops, Rubs or new Murmurs,  +ve B.Sounds, Abd Soft, No tenderness,   No Cyanosis, Clubbing or edema     Lab Results:  Data Reviewed: I have personally  reviewed following labs and reports of the imaging studies  CBC: Recent Labs  Lab 05/26/23 1511 05/26/23 1515 05/27/23 0546 05/29/23 0847 05/30/23 0525  WBC 7.3  --  7.7 7.7 6.9  NEUTROABS 4.3  --   --  4.9 4.7  HGB 12.9* 13.3 12.2* 13.5 13.0  HCT 38.0* 39.0 35.6* 38.6* 37.5*  MCV 93.6  --  92.5 90.4 92.1  PLT 162  --  150 168 165    Basic Metabolic Panel: Recent Labs  Lab 05/26/23 1511 05/26/23 1515 05/27/23 0546 05/29/23 0847 05/30/23 0525  NA 138 137 135 141 139  K 3.7 3.7 3.8 3.4* 3.8  CL 104 104 105 102 100  CO2 21*  --  25 26 27   GLUCOSE 91 86 124* 89 90  BUN 25* 25* 20 8 13   CREATININE 1.67* 1.90* 1.33* 1.08 1.21  CALCIUM 9.2  --  8.7* 9.7 9.4  MG  --   --   --  1.6* 2.3  PHOS  --   --   --  2.5 3.6    GFR: Estimated Creatinine Clearance: 63.1 mL/min (by C-G formula based on SCr of 1.21 mg/dL).  Liver Function Tests: Recent Labs  Lab 05/26/23 1511 05/27/23 0546 05/29/23 0847 05/30/23 0525  AST 38 33 52* 46*  ALT 19 19 22 22   ALKPHOS 74 66 60 62  BILITOT 0.7 0.5 0.9 0.9  PROT 6.2* 5.9* 6.0* 5.9*  ALBUMIN 3.7 3.4* 3.6 3.2*     Coagulation Profile: Recent Labs  Lab 05/26/23 1511  INR 1.1    CBG: Recent Labs  Lab 05/26/23 1510  GLUCAP 80     Radiology Studies: DG Chest Port 1 View Result Date: 05/30/2023 CLINICAL DATA:  Shortness of breath.  Stroke. EXAM: PORTABLE CHEST 1 VIEW COMPARISON:  03/29/2022 FINDINGS: Stable cardiomediastinal contours. Aortic atherosclerosis. Asymmetric elevation of right hemidiaphragm, unchanged. Increase opacity within the retrocardiac left lower lobe which may reflect atelectasis or airspace disease. Visualized osseous structures are notable for remote healed left clavicle fracture. No acute osseous findings. IMPRESSION: Increase opacity within the retrocardiac left lower lobe which may reflect atelectasis or airspace disease. Electronically Signed   By: Signa Kell M.D.   On: 05/30/2023 06:30     LOS: 3  days   Signature  -    Susa Raring M.D on 05/30/2023 at 9:51 AM   -  To page go to [email or web address]

## 2023-05-30 NOTE — TOC CM/SW Note (Signed)
 Transition of Care [facility]) - Inpatient Brief Assessment   Patient Details  Name: Danny Patterson MRN: [id number] Date of Birth: 1945/07/22  Transition of Care [facility] CM/SW Contact:    Mearl Latin, LCSW Phone Number: 05/30/2023, 3:21 PM   Clinical Narrative: CSW continuing to follow.   Transition of Care Asessment: Insurance and Status: Insurance coverage has been reviewed Patient has primary care physician: Yes Home environment has been reviewed: Lives with spouse Prior level of function:: Independent Prior/Current Home Services: No current home services Social Drivers of Health Review: SDOH reviewed no interventions necessary Readmission risk has been reviewed: Yes Transition of care needs: transition of care needs identified, TOC will continue to follow

## 2023-05-31 ENCOUNTER — Other Ambulatory Visit (HOSPITAL_COMMUNITY): Payer: Self-pay

## 2023-05-31 ENCOUNTER — Inpatient Hospital Stay (HOSPITAL_COMMUNITY)

## 2023-05-31 ENCOUNTER — Telehealth (HOSPITAL_COMMUNITY): Payer: Self-pay | Admitting: Pharmacy Technician

## 2023-05-31 ENCOUNTER — Other Ambulatory Visit: Payer: Self-pay

## 2023-05-31 DIAGNOSIS — G40901 Epilepsy, unspecified, not intractable, with status epilepticus: Secondary | ICD-10-CM | POA: Diagnosis not present

## 2023-05-31 DIAGNOSIS — R569 Unspecified convulsions: Secondary | ICD-10-CM | POA: Diagnosis not present

## 2023-05-31 LAB — TSH: TSH: 4.546 u[IU]/mL — ABNORMAL HIGH (ref 0.350–4.500)

## 2023-05-31 LAB — COMPREHENSIVE METABOLIC PANEL
ALT: 20 U/L (ref 0–44)
AST: 35 U/L (ref 15–41)
Albumin: 3 g/dL — ABNORMAL LOW (ref 3.5–5.0)
Alkaline Phosphatase: 59 U/L (ref 38–126)
Anion gap: 12 (ref 5–15)
BUN: 25 mg/dL — ABNORMAL HIGH (ref 8–23)
CO2: 25 mmol/L (ref 22–32)
Calcium: 9.2 mg/dL (ref 8.9–10.3)
Chloride: 101 mmol/L (ref 98–111)
Creatinine, Ser: 1.66 mg/dL — ABNORMAL HIGH (ref 0.61–1.24)
GFR, Estimated: 42 mL/min — ABNORMAL LOW (ref >60)
Glucose, Bld: 91 mg/dL (ref 70–99)
Potassium: 3.9 mmol/L (ref 3.5–5.1)
Sodium: 138 mmol/L (ref 135–145)
Total Bilirubin: 0.6 mg/dL (ref 0.0–1.2)
Total Protein: 5.7 g/dL — ABNORMAL LOW (ref 6.5–8.1)

## 2023-05-31 LAB — CBC WITH DIFFERENTIAL/PLATELET
Abs Immature Granulocytes: 0.01 10*3/uL (ref 0.00–0.07)
Basophils Absolute: 0 10*3/uL (ref 0.0–0.1)
Basophils Relative: 1 %
Eosinophils Absolute: 0.3 10*3/uL (ref 0.0–0.5)
Eosinophils Relative: 5 %
HCT: 37.6 % — ABNORMAL LOW (ref 39.0–52.0)
Hemoglobin: 13 g/dL (ref 13.0–17.0)
Immature Granulocytes: 0 %
Lymphocytes Relative: 29 %
Lymphs Abs: 2 10*3/uL (ref 0.7–4.0)
MCH: 31.8 pg (ref 26.0–34.0)
MCHC: 34.6 g/dL (ref 30.0–36.0)
MCV: 91.9 fL (ref 80.0–100.0)
Monocytes Absolute: 0.5 10*3/uL (ref 0.1–1.0)
Monocytes Relative: 7 %
Neutro Abs: 4 10*3/uL (ref 1.7–7.7)
Neutrophils Relative %: 58 %
Platelets: 169 10*3/uL (ref 150–400)
RBC: 4.09 MIL/uL — ABNORMAL LOW (ref 4.22–5.81)
RDW: 14.4 % (ref 11.5–15.5)
WBC: 6.9 10*3/uL (ref 4.0–10.5)
nRBC: 0 % (ref 0.0–0.2)

## 2023-05-31 LAB — VALPROIC ACID LEVEL: Valproic Acid Lvl: 61 ug/mL (ref 50.0–100.0)

## 2023-05-31 LAB — CORTISOL: Cortisol, Plasma: 27.7 ug/dL

## 2023-05-31 LAB — GLUCOSE, CAPILLARY
Glucose-Capillary: 88 mg/dL (ref 70–99)
Glucose-Capillary: 130 mg/dL — ABNORMAL HIGH (ref 70–99)

## 2023-05-31 LAB — BRAIN NATRIURETIC PEPTIDE: B Natriuretic Peptide: 33.8 pg/mL (ref 0.0–100.0)

## 2023-05-31 LAB — MAGNESIUM: Magnesium: 2.2 mg/dL (ref 1.7–2.4)

## 2023-05-31 LAB — PHOSPHORUS: Phosphorus: 5.3 mg/dL — ABNORMAL HIGH (ref 2.5–4.6)

## 2023-05-31 LAB — AMMONIA: Ammonia: 28 umol/L (ref 9–35)

## 2023-05-31 MED ORDER — SODIUM CHLORIDE 0.9 % IV SOLN: INTRAVENOUS | Status: DC

## 2023-05-31 MED ORDER — LACTATED RINGERS IV SOLN: INTRAVENOUS | Status: AC

## 2023-05-31 MED ORDER — MIDODRINE HCL 5 MG PO TABS
5.0000 mg | ORAL_TABLET | Freq: Three times a day (TID) | ORAL | Status: DC
  Administered 2023-05-31: 5 mg via ORAL
  Filled 2023-05-31: qty 1

## 2023-05-31 MED ORDER — LORAZEPAM 2 MG/ML IJ SOLN: 1.0000 mg | Freq: Once | INTRAMUSCULAR | Status: DC

## 2023-05-31 MED ORDER — IOHEXOL 350 MG/ML SOLN
75.0000 mL | Freq: Once | INTRAVENOUS | Status: AC | PRN
  Administered 2023-05-31: 75 mL via INTRAVENOUS

## 2023-05-31 MED ORDER — LORAZEPAM 2 MG/ML IJ SOLN
INTRAMUSCULAR | Status: AC
  Filled 2023-05-31: qty 1

## 2023-05-31 NOTE — Progress Notes (Addendum)
 Subjective: No further seizures.  Denies any concerns.  Sitting up in chair.  ROS: negative except above  Examination  Vital signs in last 24 hours: Temp:  [97.9 F (36.6 C)-98.6 F (37 C)] 98.5 F (36.9 C) (03/11 1143) Pulse Rate:  [63-93] 68 (03/11 1143) Resp:  [11-24] 17 (03/11 1143) BP: (83-154)/(54-84) 90/54 (03/11 1143) SpO2:  [95 %-99 %] 95 % (03/11 1143) Weight:  [106.3 kg] 106.3 kg (03/10 2346)  General: sitting in chair, NAD Neuro: AOx3, follows commands, able to name objects, cranial nerves appear grossly intact, 4/5 in all 4 extremities  Basic Metabolic Panel: Recent Labs  Lab 05/26/23 1511 05/26/23 1515 05/27/23 0546 05/29/23 0847 05/30/23 0525 05/31/23 0430  NA 138 137 135 141 139 138  K 3.7 3.7 3.8 3.4* 3.8 3.9  CL 104 104 105 102 100 101  CO2 21*  --  25 26 27 25   GLUCOSE 91 86 124* 89 90 91  BUN 25* 25* 20 8 13  25*  CREATININE 1.67* 1.90* 1.33* 1.08 1.21 1.66*  CALCIUM 9.2  --  8.7* 9.7 9.4 9.2  MG  --   --   --  1.6* 2.3 2.2  PHOS  --   --   --  2.5 3.6 5.3*    CBC: Recent Labs  Lab 05/26/23 1511 05/26/23 1515 05/27/23 0546 05/29/23 0847 05/30/23 0525 05/31/23 0430  WBC 7.3  --  7.7 7.7 6.9 6.9  NEUTROABS 4.3  --   --  4.9 4.7 4.0  HGB 12.9* 13.3 12.2* 13.5 13.0 13.0  HCT 38.0* 39.0 35.6* 38.6* 37.5* 37.6*  MCV 93.6  --  92.5 90.4 92.1 91.9  PLT 162  --  150 168 165 169     Coagulation Studies: No results for input(s): "LABPROT", "INR" in the last 72 hours.  Imaging personally reviewed  MRI brain without contrast 05/30/2023:  No acute intracranial abnormality. Numerous chronic microhemorrhages in a predominantly peripheral distribution, most consistent with cerebral amyloid angiopathy.  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  TTE 05/30/2023: Left ventricular ejection fraction 60 to 65%, no regional wall motion abnormalities, no thrombus, no PFO  ASSESSMENT AND PLAN: 78 y.o. male who presents with  new onset focal status epilepticus.  He continues to be quite delirious, and does admit to drinking about six beers a day.  I will check a thiamine level and start high-dose thiamine.  In addition, Keppra can sometimes to be deliriogenic and therefore I will change from Keppra to Depakote.   New onset status epilepticus due to underlying stroke -No evidence of infection, electrolytes, blood glucose wnl   Recommendations -Continue valproic acid 750 twice daily.  -Rescue medication: Clonazepam 2 mg for seizure lasting more than 2 minutes.  Valtoco co-pay is over thousand dollars -Continue aspirin 81 mg daily.  LDL 63.  -Continue seizure precautions -As needed IV Ativan for clinical seizures -Follow-up with neurology in 3 months (order placed)  Seizure precautions: Per [facility] statutes, patients with seizures are not allowed to drive until they have been seizure-free for six months and cleared by a physician    Use caution when using heavy equipment or power tools. Avoid working on ladders or at heights. Take showers instead of baths. Ensure the water temperature is not too high on the home water heater. Do not go swimming alone. Do not lock yourself in a room alone (i.e. bathroom). When caring for infants or small children, sit down when holding,  feeding, or changing them to minimize risk of injury to the child in the event you have a seizure. Maintain good sleep hygiene. Avoid alcohol.    If patient has another seizure, call 911 and bring them back to the ED if: A.  The seizure lasts longer than 5 minutes.      B.  The patient doesn't wake shortly after the seizure or has new problems such as difficulty seeing, speaking or moving following the seizure C.  The patient was injured during the seizure D.  The patient has a temperature over 102 F (39C) E.  The patient vomited during the seizure and now is having trouble breathing    During the Seizure   - First, ensure adequate  ventilation and place patients on the floor on their left side  Loosen clothing around the neck and ensure the airway is patent. If the patient is clenching the teeth, do not force the mouth open with any object as this can cause severe damage - Remove all items from the surrounding that can be hazardous. The patient may be oblivious to what's happening and may not even know what he or she is doing. If the patient is confused and wandering, either gently guide him/her away and block access to outside areas - Reassure the individual and be comforting - Call 911. In most cases, the seizure ends before EMS arrives. However, there are cases when seizures may last over 3 to 5 minutes. Or the individual may have developed breathing difficulties or severe injuries. If a pregnant patient or a person with diabetes develops a seizure, it is prudent to call an ambulance.     After the Seizure (Postictal Stage)   After a seizure, most patients experience confusion, fatigue, muscle pain and/or a headache. Thus, one should permit the individual to sleep. For the next few days, reassurance is essential. Being calm and helping reorient the person is also of importance.   Most seizures are painless and end spontaneously. Seizures are not harmful to others but can lead to complications such as stress on the lungs, brain and the heart. Individuals with prior lung problems may develop labored breathing and respiratory distress.      I have spent a total of  35 minutes with the patient reviewing hospital notes,  test results, labs and examining the patient as well as establishing an assessment and plan that was discussed personally with the patient.  > 50% of time was spent in direct patient care.        Lindie Spruce Epilepsy [facility] For questions after 5pm please refer to AMION to reach the Neurologist on call

## 2023-05-31 NOTE — Progress Notes (Signed)
 LTM maint complete - no skin breakdown  Study shut down, tech placed study on server running.  [facility] having difficulties viewing  Not monitored by [facility].

## 2023-05-31 NOTE — Telephone Encounter (Signed)
 Patient Product/process development scientist completed.    The patient is insured through [employer]. Patient has Medicare and is not eligible for a copay card, but may be able to apply for patient assistance or Medicare RX Payment Plan (Patient Must reach out to their plan, if eligible for payment plan), if available.    Ran test claim for Valtoco 20 mg and the current 30 day co-pay is $1,157.91.   This test claim was processed through [facility]- copay amounts may vary at other pharmacies due to pharmacy/plan contracts, or as the patient moves through the different stages of their insurance plan.     Roland Earl, CPHT Pharmacy Technician III Certified Patient Advocate [facility] Patient Advocate Team Direct Number: [phone]  Fax: [phone]

## 2023-05-31 NOTE — Progress Notes (Signed)
 Inpatient Rehab Admissions Coordinator:     I spoke to Pt. And daughters and they are in agreement to pursue CIR for Pt. Daughter, Lyla Son, works from home and can provide 24/7 assist at home.  Case sent to insurance for review.   Megan Salon, MS, CCC-SLP Rehab Admissions Coordinator  [phone] (celll) [phone] (office)

## 2023-05-31 NOTE — Progress Notes (Signed)
 A code stroke was activated due to an abrupt change in the patient's mental status.  Around 11:30 AM, the patient was assisted to the bedside chair and was sitting there and in his normal state.  Family arrived sometime after that he just appeared to be asleep and they were letting him sleep.  When the nurse entered to help set up his lunch, it was noticed that he was very difficult to arouse.  He was moved back to bed, and there is noted some tremulous activity which was videoed.  On my review, this appears faster than typical clonic activity and he was moving his arm voluntarily during it, but it was rhythmic and unilateral.  His blood pressures were noted to be quite low.  There was a documented 54 systolic, though unclear if this was a true pressure as it was not repeated for some time.  Once back in the bed, it was noted to be with systolics in the 80s, mean arterial pressures in the low 50s.  On my exam, he had a leftward gaze preference, would state "stop mashing on me" when I pinched him, but moved both sides though slightly less on the left than right.  He would not answer questions or follow commands.  I accompanied him emergently to the CT department where a CT head and CTA head and neck was performed.  This is unchanged from the previous CTA.  At this time I think it is possible that he had a recurrent seizure while seated, but also very possible that this event has been solely driven by hypoperfusion.  He has multifocal intracranial stenosis, including left MCA which could explain his leftward gaze.  I would favor reconnecting EEG to ensure that he is not having continued recurrent seizures given that this was similar to his initial presentation when he was in status.  1) valproic acid level 2) continue Depakote 750 twice daily 3) overnight EEG with video 4) neurology team will follow  This patient is critically ill and at significant risk of neurological worsening, death and care  requires constant monitoring of vital signs, hemodynamics,respiratory and cardiac monitoring, neurological assessment, discussion with family, other specialists and medical decision making of high complexity. I spent 50 minutes of neurocritical care time  in the care of  this patient. This was time spent independent of any time provided by nurse practitioner or PA.  Ritta Slot, MD [facility]   If 7pm- 7am, please page neurology on call as listed in AMION. 05/31/2023  2:06 PM

## 2023-05-31 NOTE — Progress Notes (Signed)
 LTM EEG hooked up and running - no initial skin breakdown - push button tested - [facility] monitoring.

## 2023-05-31 NOTE — Progress Notes (Addendum)
 Physical Therapy Treatment Patient Details Name: Danny Patterson MRN: [id number] DOB: [birth year] Today's Date: 05/31/2023   History of Present Illness Pt is a 78 y.o. M who presents with left sided gaze preference and weakness. Presented as code stroke. CT head negative for acute abnormality. Now being worked up for seizure. Significant PMH: HTN, stable BPH, gout, lumbar fusion, history of kidney stone.    PT Comments  Pt resting in bed on arrival and agreeable to session with light encouragement and demonstrating good progress towards acute goals. Pt requiring mod A to come to sitting EOB with pt demonstrating improved initiation. Pt needing mod A to boost to stand and mod A +2 (+3 for chair follow for safety) for gait progression into hallway. Pt with heavy reliance on UE support and HHA with flexed posture and low effortful steps, placing pt high risk for falls. Pt able to maintain standing at sink with single UE support for one ADL before fatigue. Patient will benefit from intensive inpatient follow-up therapy, >3 hours/day to maximize functional mobility and independence. Pt continues to benefit from skilled PT services to progress toward functional mobility goals.     If plan is discharge home, recommend the following: A lot of help with walking and/or transfers;A lot of help with bathing/dressing/bathroom   Can travel by private vehicle        Equipment Recommendations  BSC/3in1;Wheelchair (measurements PT);Wheelchair cushion (measurements PT)    Recommendations for Other Services       Precautions / Restrictions Precautions Precautions: Fall;Other (comment) Restrictions Weight Bearing Restrictions Per Provider Order: No     Mobility  Bed Mobility Overal bed mobility: Needs Assistance Bed Mobility: Supine to Sit, Sit to Supine     Supine to sit: Min assist, HOB elevated, Used rails     General bed mobility comments: min A to elevate trunk, increased time needed to scoot out to  EOB    Transfers Overall transfer level: Needs assistance Equipment used: 1 person hand held assist Transfers: Sit to/from Stand Sit to Stand: Max assist, Mod assist           General transfer comment: mod A to steady and facilitate anterior translation, bracing LEs at EOB    Ambulation/Gait Ambulation/Gait assistance: Mod assist, +2 safety/equipment (+3 for chair follow) Gait Distance (Feet): 57 Feet Assistive device: 2 person hand held assist Gait Pattern/deviations: Step-through pattern, Decreased stride length Gait velocity: decreased     General Gait Details: Crouched posture, with pt stating he is unable to correct, heavy reliance on bil UE HHA support, chair follow for safety, low and effortful steps   Stairs             Wheelchair Mobility     Tilt Bed    Modified Rankin (Stroke Patients Only)       Balance Overall balance assessment: Needs assistance Sitting-balance support: Feet supported Sitting balance-Leahy Scale: Fair     Standing balance support: Bilateral upper extremity supported Standing balance-Leahy Scale: Poor Standing balance comment: reliant on BUE support during synamic tasks, able to maintain standing at sink with single UE support to brush hair                            Communication Communication Communication: Impaired Factors Affecting Communication: Reduced clarity of speech;Difficulty expressing self  Cognition Arousal: Alert Behavior During Therapy: Flat affect   PT - Cognitive impairments: Awareness, Attention, Memory, Initiation, Sequencing, Problem solving, Safety/Judgement  PT - Cognition Comments: awake and alert throughout session, masking deficits with humor Following commands: Impaired Following commands impaired: Follows one step commands with increased time, Follows multi-step commands inconsistently    Cueing Cueing Techniques: Verbal cues, Tactile cues, Gestural  cues  Exercises      General Comments        Pertinent Vitals/Pain Pain Assessment Pain Assessment: Faces Faces Pain Scale: No hurt Pain Intervention(s): Monitored during session    Home Living                          Prior Function            PT Goals (current goals can now be found in the care plan section) Acute Rehab PT Goals PT Goal Formulation: With patient/family Time For Goal Achievement: 06/11/23 Progress towards PT goals: Progressing toward goals    Frequency    Min 3X/week      PT Plan      Co-evaluation              AM-PAC PT "6 Clicks" Mobility   Outcome Measure  Help needed turning from your back to your side while in a flat bed without using bedrails?: A Lot Help needed moving from lying on your back to sitting on the side of a flat bed without using bedrails?: A Lot Help needed moving to and from a bed to a chair (including a wheelchair)?: A Lot Help needed standing up from a chair using your arms (e.g., wheelchair or bedside chair)?: A Lot Help needed to walk in hospital room?: A Lot Help needed climbing 3-5 steps with a railing? : Total 6 Click Score: 11    End of Session Equipment Utilized During Treatment: Gait belt Activity Tolerance: Patient tolerated treatment well Patient left: in chair;with call bell/phone within reach;with chair alarm set (with door open and RN aware of pt location) Nurse Communication: Mobility status PT Visit Diagnosis: Unsteadiness on feet (R26.81);History of falling (Z91.81);Difficulty in walking, not elsewhere classified (R26.2)     Time: 4098-1191 PT Time Calculation (min) (ACUTE ONLY): 26 min  Charges:    $Gait Training: 8-22 mins $Therapeutic Activity: 8-22 mins PT General Charges $$ ACUTE PT VISIT: 1 Visit                     [name] PTA Acute Rehabilitation Services Office: [phone]   Catalina Antigua 05/31/2023, 12:20 PM

## 2023-05-31 NOTE — Code Documentation (Signed)
 Stroke Response Nurse Documentation Code Documentation  Danny Patterson is a 78 y.o. male admitted to [facility]  on 05/26/2023 for seizures with past medical hx of hypertension, CKD. On aspirin 81 mg daily. Code stroke was activated by [employer] Staff.   Patient on 5W unit where he was LKW at 1130 and now complaining of left gaze, not responding, and left > right sided weakness. Pt was at baseline at 1130. Nurse came in to help with lunch and found patient sitting in the chair unresponsive and gaze to the left side. Staff moved patient to the bed and noted to be hypotension. Fluids started and Code Stroke called.    Stroke team at the bedside after patient activation. Patient to CT with team. NIHSS 24, see documentation for details and code stroke times. Patient with decreased LOC, disoriented, not following commands, left gaze preference , right hemianopia, bilateral arm weakness, bilateral leg weakness, Global aphasia , and dysarthria  on exam. The following imaging was completed:  CT Head and CTA. Patient is not a candidate for IV Thrombolytic due to stroke not suspected. Patient is not a candidate for IR due to no LVO noted on imaging per MD Amada Jupiter.   Care/Plan: Cancel Code Stroke, Plan for continuous EEG.   Bedside handoff with RN Jon Gills.  Lucila Maine  Stroke Response RN

## 2023-05-31 NOTE — Progress Notes (Addendum)
 [facility] PROGRESS NOTE   Seanpatrick Maisano VFI:[id number] DOB: 06-13-1945 DOA: 05/26/2023  PCP: Smitty Cords, DO  Brief History:  78 y.o. male with medical history significant of HTN, stable BPH, could not tolerate Flomax, gout, lumbar fusion, history of kidney stone presents from home with syncopal episode.  He was found to have left-sided gaze preference and left arm and leg weakness.  Presented as a code stroke.  Apparently also had a shaking episode.  Seen by neurology.  Concern was for seizure activity.  He was hospitalized for further management.   Consultants: Neurology  Procedures: Continuous EEG shows nonspecific encephalopathy.  No active seizures.   Subjective: Patient in bed, appears comfortable, denies any headache, no fever, no chest pain or pressure, no shortness of breath , no abdominal pain. No new focal weakness.    Assessment/Plan:  Seizure/acute metabolic encephalopathy Thought to have seizure activity.  Did have left-sided symptoms.  Initially thought to be stroke. CT head did not show any acute findings.  Encephalomalacia was noted.  CT angiogram head and neck did not show any large vessel occlusion.  Neurology has requested MRI of the brain which will be done along with echocardiogram. Neurology on board Case discussed with neurology on 05/29/2023, since he is getting delirium Keppra has been switched to Depakote, non acute MRI brain and echocardiogram, as needed Seroquel and Haldol.  Continuous EEG does not show any active seizures. Continue supportive care, PT OT.  AKI versus chronic kidney disease stage IIIa   No old labs available.  Creatinine was 1.67 on admission creatinine fluctuating between 1.3 and 1.6, gentle hydration and monitor outpatient nephrology follow-up.  Will check renal ultrasound.  Normocytic anemia  No evidence of overt bleeding.  Continue to monitor.  Essential hypertension - Noted to be on amlodipine.  Will add blood  pressure medications once MRI done and stroke rule out.  Hyperlipidemia - Continue with statin.  Hypokalemia and hypomagnesemia.  Replaced.    History of gout   Continue with allopurinol.  Colchicine on hold.  Obesity Estimated body mass index is 33.63 kg/m as calculated from the following:   Height as of this encounter: 5\' 10"  (1.778 m).   Weight as of this encounter: 106.3 kg.  DVT Prophylaxis: Subcutaneous heparin Code Status: Full code Family Communication:  daughter over the phone [phone] on 05/30/2022 Disposition Plan: To be determined.  PT OT SLP evaluation  Status is: Inpatient Remains inpatient appropriate because: Acute encephalopathy, concern for seizure activity  Medications: Scheduled:  allopurinol  300 mg Oral Daily   aspirin EC  81 mg Oral Daily   divalproex  750 mg Oral Q12H   heparin  5,000 Units Subcutaneous Q8H   multivitamin with minerals  1 tablet Oral Daily   QUEtiapine  25 mg Oral BID   Continuous:  lactated ringers      WF[facility]:[id number] **OR** acetaminophen, albuterol, haloperidol lactate, hydrALAZINE, ondansetron **OR** ondansetron (ZOFRAN) IV, traMADol   Objective:  Vital Signs  Vitals:   05/30/23 2105 05/30/23 2346 05/31/23 0438 05/31/23 0837  BP: (!) 83/56 (!) 106/57 (!) 154/71 130/84  Pulse: 74 69 63 93  Resp: 14 13 11  (!) 24  Temp:  98.3 F (36.8 C) 98.4 F (36.9 C) 98.6 F (37 C)  TempSrc:  Oral Oral Oral  SpO2: 95% 98% 99% 96%  Weight:  106.3 kg    Height:        Intake/Output Summary (Last 24 hours) at 05/31/2023 4270 Last data filed  at 05/31/2023 0837 Gross per 24 hour  Intake 236 ml  Output 0 ml  Net 236 ml   Filed Weights   05/26/23 1608 05/27/23 0455 05/30/23 2346  Weight: 107.5 kg 111.9 kg 106.3 kg    Exam  Awake minimally confused, No new F.N deficits, Normal affect [place].AT,PERRAL Supple Neck, No JVD,   Symmetrical Chest wall movement, Good air movement bilaterally, CTAB RRR,No Gallops, Rubs or new  Murmurs,  +ve B.Sounds, Abd Soft, No tenderness,   No Cyanosis, Clubbing or edema     Lab Results:  Data Reviewed: I have personally reviewed following labs and reports of the imaging studies  CBC: Recent Labs  Lab 05/26/23 1511 05/26/23 1515 05/27/23 0546 05/29/23 0847 05/30/23 0525 05/31/23 0430  WBC 7.3  --  7.7 7.7 6.9 6.9  NEUTROABS 4.3  --   --  4.9 4.7 4.0  HGB 12.9* 13.3 12.2* 13.5 13.0 13.0  HCT 38.0* 39.0 35.6* 38.6* 37.5* 37.6*  MCV 93.6  --  92.5 90.4 92.1 91.9  PLT 162  --  150 168 165 169    Basic Metabolic Panel: Recent Labs  Lab 05/26/23 1511 05/26/23 1515 05/27/23 0546 05/29/23 0847 05/30/23 0525 05/31/23 0430  NA 138 137 135 141 139 138  K 3.7 3.7 3.8 3.4* 3.8 3.9  CL 104 104 105 102 100 101  CO2 21*  --  25 26 27 25   GLUCOSE 91 86 124* 89 90 91  BUN 25* 25* 20 8 13  25*  CREATININE 1.67* 1.90* 1.33* 1.08 1.21 1.66*  CALCIUM 9.2  --  8.7* 9.7 9.4 9.2  MG  --   --   --  1.6* 2.3 2.2  PHOS  --   --   --  2.5 3.6 5.3*    GFR: Estimated Creatinine Clearance: 44.8 mL/min (A) (by C-G formula based on SCr of 1.66 mg/dL (H)).  Liver Function Tests: Recent Labs  Lab 05/26/23 1511 05/27/23 0546 05/29/23 0847 05/30/23 0525 05/31/23 0430  AST 38 33 52* 46* 35  ALT 19 19 22 22 20   ALKPHOS 74 66 60 62 59  BILITOT 0.7 0.5 0.9 0.9 0.6  PROT 6.2* 5.9* 6.0* 5.9* 5.7*  ALBUMIN 3.7 3.4* 3.6 3.2* 3.0*     Coagulation Profile: Recent Labs  Lab 05/26/23 1511  INR 1.1    CBG: Recent Labs  Lab 05/26/23 1510  GLUCAP 80     Radiology Studies: MR BRAIN WO CONTRAST Result Date: 05/30/2023 CLINICAL DATA:  Seizure disorder EXAM: MRI HEAD WITHOUT CONTRAST TECHNIQUE: Multiplanar, multiecho pulse sequences of the brain and surrounding structures were obtained without intravenous contrast. COMPARISON:  None Available. FINDINGS: Brain: No acute infarct, mass effect or extra-axial collection. Numerous chronic microhemorrhages in a predominantly  peripheral distribution. Chronic subarachnoid blood over the left frontal lobe and medial right parietal lobe. There is multifocal hyperintense T2-weighted signal within the white matter. Generalized volume loss. Old right parietal infarct. The midline structures are normal. Vascular: Normal flow voids. Skull and upper cervical spine: Normal calvarium and skull base. Visualized upper cervical spine and soft tissues are normal. Sinuses/Orbits:No paranasal sinus fluid levels or advanced mucosal thickening. No mastoid or middle ear effusion. Normal orbits. IMPRESSION: 1. No acute intracranial abnormality. 2. Numerous chronic microhemorrhages in a predominantly peripheral distribution, most consistent with cerebral amyloid angiopathy. 3. 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 Electronically Signed   By: Deatra Robinson M.D.   On: 05/30/2023 19:08   ECHOCARDIOGRAM  COMPLETE Result Date: 05/30/2023    ECHOCARDIOGRAM REPORT   Patient Name:   Danny Patterson Date of Exam: 05/30/2023 Medical Rec #:  [id number]   Height:       70.0 in Accession #:    [id number]  Weight:       246.7 lb Date of Birth:  1945-11-14   BSA:          2.282 m Patient Age:    78 years    BP:           172/83 mmHg Patient Gender: M           HR:           82 bpm. Exam Location:  Inpatient Procedure: 2D Echo, Cardiac Doppler, Color Doppler and Intracardiac            Opacification Agent (Both Spectral and Color Flow Doppler were            utilized during procedure). Indications:    Stroke  History:        Patient has prior history of Echocardiogram examinations, most                 recent 10/08/2015.  Sonographer:    Karma Ganja Referring Phys: [id number] MCNEILL P KIRKPATRICK  Sonographer Comments: Technically difficult study due to poor echo windows and no subcostal window. Image acquisition challenging due to patient body habitus and Image acquisition challenging due to uncooperative patient. IMPRESSIONS  1. Left ventricular  ejection fraction, by estimation, is 60 to 65%. The left ventricle has normal function. The left ventricle has no regional wall motion abnormalities. There is mild concentric left ventricular hypertrophy. Left ventricular diastolic function could not be evaluated.  2. Right ventricular systolic function is normal. The right ventricular size is normal.  3. The mitral valve is normal in structure. No evidence of mitral valve regurgitation. No evidence of mitral stenosis.  4. The aortic valve has an indeterminant number of cusps. Aortic valve regurgitation is not visualized. No aortic stenosis is present.  5. The inferior vena cava is normal in size with greater than 50% respiratory variability, suggesting right atrial pressure of 3 mmHg. FINDINGS  Left Ventricle: Left ventricular ejection fraction, by estimation, is 60 to 65%. The left ventricle has normal function. The left ventricle has no regional wall motion abnormalities. Definity contrast agent was given IV to delineate the left ventricular  endocardial borders. The left ventricular internal cavity size was normal in size. There is mild concentric left ventricular hypertrophy. Left ventricular diastolic function could not be evaluated due to indeterminate diastolic function. Left ventricular diastolic function could not be evaluated. Right Ventricle: The right ventricular size is normal. No increase in right ventricular wall thickness. Right ventricular systolic function is normal. Left Atrium: Left atrial size was normal in size. Right Atrium: Right atrial size was normal in size. Pericardium: There is no evidence of pericardial effusion. Mitral Valve: The mitral valve is normal in structure. No evidence of mitral valve regurgitation. No evidence of mitral valve stenosis. Tricuspid Valve: The tricuspid valve is normal in structure. Tricuspid valve regurgitation is not demonstrated. No evidence of tricuspid stenosis. Aortic Valve: The aortic valve has an  indeterminant number of cusps. Aortic valve regurgitation is not visualized. No aortic stenosis is present. Aortic valve mean gradient measures 4.0 mmHg. Aortic valve peak gradient measures 7.2 mmHg. Aortic valve area, by VTI measures 2.33 cm. Pulmonic Valve: The pulmonic valve was not well visualized. Pulmonic valve  regurgitation is not visualized. No evidence of pulmonic stenosis. Aorta: The aortic root is normal in size and structure. Venous: The inferior vena cava is normal in size with greater than 50% respiratory variability, suggesting right atrial pressure of 3 mmHg. IAS/Shunts: No atrial level shunt detected by color flow Doppler.  LEFT VENTRICLE PLAX 2D LVIDd:         5.60 cm      Diastology LVIDs:         2.70 cm      LV e' medial:    6.68 cm/s LV PW:         1.30 cm      LV E/e' medial:  8.0 LV IVS:        1.30 cm      LV e' lateral:   6.68 cm/s LVOT diam:     2.10 cm      LV E/e' lateral: 8.0 LV SV:         57 LV SV Index:   25 LVOT Area:     3.46 cm  LV Volumes (MOD) LV vol d, MOD A2C: 135.0 ml LV vol d, MOD A4C: 151.0 ml LV vol s, MOD A2C: 47.0 ml LV vol s, MOD A4C: 54.4 ml LV SV MOD A2C:     88.0 ml LV SV MOD A4C:     151.0 ml LV SV MOD BP:      92.2 ml LEFT ATRIUM           Index LA diam:      4.40 cm 1.93 cm/m LA Vol (A4C): 88.4 ml 38.74 ml/m  AORTIC VALVE AV Area (Vmax):    2.46 cm AV Area (Vmean):   2.25 cm AV Area (VTI):     2.33 cm AV Vmax:           134.00 cm/s AV Vmean:          90.500 cm/s AV VTI:            0.247 m AV Peak Grad:      7.2 mmHg AV Mean Grad:      4.0 mmHg LVOT Vmax:         95.00 cm/s LVOT Vmean:        58.700 cm/s LVOT VTI:          0.166 m LVOT/AV VTI ratio: 0.67  AORTA Ao Root diam: 3.40 cm Ao Asc diam:  3.30 cm MITRAL VALVE MV Area (PHT): 2.87 cm    SHUNTS MV Decel Time: 264 msec    Systemic VTI:  0.17 m MV E velocity: 53.60 cm/s  Systemic Diam: 2.10 cm MV A velocity: 82.00 cm/s MV E/A ratio:  0.65 Aditya Sabharwal Electronically signed by Dorthula Nettles  Signature Date/Time: 05/30/2023/12:01:41 PM    Final    DG Chest Port 1 View Result Date: 05/30/2023 CLINICAL DATA:  Shortness of breath.  Stroke. EXAM: PORTABLE CHEST 1 VIEW COMPARISON:  03/29/2022 FINDINGS: Stable cardiomediastinal contours. Aortic atherosclerosis. Asymmetric elevation of right hemidiaphragm, unchanged. Increase opacity within the retrocardiac left lower lobe which may reflect atelectasis or airspace disease. Visualized osseous structures are notable for remote healed left clavicle fracture. No acute osseous findings. IMPRESSION: Increase opacity within the retrocardiac left lower lobe which may reflect atelectasis or airspace disease. Electronically Signed   By: Signa Kell M.D.   On: 05/30/2023 06:30     LOS: 4 days   Signature  -    Susa Raring M.D on 05/31/2023 at  9:50 AM   -  To page go to [email or web address]

## 2023-06-01 ENCOUNTER — Inpatient Hospital Stay (HOSPITAL_COMMUNITY)

## 2023-06-01 DIAGNOSIS — R569 Unspecified convulsions: Secondary | ICD-10-CM | POA: Diagnosis not present

## 2023-06-01 DIAGNOSIS — G40901 Epilepsy, unspecified, not intractable, with status epilepticus: Secondary | ICD-10-CM | POA: Diagnosis not present

## 2023-06-01 DIAGNOSIS — R404 Transient alteration of awareness: Secondary | ICD-10-CM

## 2023-06-01 DIAGNOSIS — I63131 Cerebral infarction due to embolism of right carotid artery: Secondary | ICD-10-CM | POA: Diagnosis not present

## 2023-06-01 LAB — COMPREHENSIVE METABOLIC PANEL
ALT: 19 U/L (ref 0–44)
AST: 31 U/L (ref 15–41)
Albumin: 3 g/dL — ABNORMAL LOW (ref 3.5–5.0)
Alkaline Phosphatase: 55 U/L (ref 38–126)
Anion gap: 7 (ref 5–15)
BUN: 29 mg/dL — ABNORMAL HIGH (ref 8–23)
CO2: 25 mmol/L (ref 22–32)
Calcium: 9 mg/dL (ref 8.9–10.3)
Chloride: 103 mmol/L (ref 98–111)
Creatinine, Ser: 1.57 mg/dL — ABNORMAL HIGH (ref 0.61–1.24)
GFR, Estimated: 45 mL/min — ABNORMAL LOW (ref >60)
Glucose, Bld: 87 mg/dL (ref 70–99)
Potassium: 3.8 mmol/L (ref 3.5–5.1)
Sodium: 135 mmol/L (ref 135–145)
Total Bilirubin: 0.7 mg/dL (ref 0.0–1.2)
Total Protein: 5.6 g/dL — ABNORMAL LOW (ref 6.5–8.1)

## 2023-06-01 LAB — CBC WITH DIFFERENTIAL/PLATELET
Abs Immature Granulocytes: 0.01 10*3/uL (ref 0.00–0.07)
Basophils Absolute: 0 10*3/uL (ref 0.0–0.1)
Basophils Relative: 0 %
Eosinophils Absolute: 0.2 10*3/uL (ref 0.0–0.5)
Eosinophils Relative: 3 %
HCT: 35.1 % — ABNORMAL LOW (ref 39.0–52.0)
Hemoglobin: 12.1 g/dL — ABNORMAL LOW (ref 13.0–17.0)
Immature Granulocytes: 0 %
Lymphocytes Relative: 30 %
Lymphs Abs: 2.1 10*3/uL (ref 0.7–4.0)
MCH: 31.6 pg (ref 26.0–34.0)
MCHC: 34.5 g/dL (ref 30.0–36.0)
MCV: 91.6 fL (ref 80.0–100.0)
Monocytes Absolute: 0.5 10*3/uL (ref 0.1–1.0)
Monocytes Relative: 7 %
Neutro Abs: 4.2 10*3/uL (ref 1.7–7.7)
Neutrophils Relative %: 60 %
Platelets: 159 10*3/uL (ref 150–400)
RBC: 3.83 MIL/uL — ABNORMAL LOW (ref 4.22–5.81)
RDW: 14.6 % (ref 11.5–15.5)
WBC: 7 10*3/uL (ref 4.0–10.5)
nRBC: 0 % (ref 0.0–0.2)

## 2023-06-01 LAB — GLUCOSE, CAPILLARY
Glucose-Capillary: 86 mg/dL (ref 70–99)
Glucose-Capillary: 123 mg/dL — ABNORMAL HIGH (ref 70–99)

## 2023-06-01 LAB — BRAIN NATRIURETIC PEPTIDE: B Natriuretic Peptide: 42 pg/mL (ref 0.0–100.0)

## 2023-06-01 LAB — PHOSPHORUS: Phosphorus: 3.6 mg/dL (ref 2.5–4.6)

## 2023-06-01 LAB — MAGNESIUM: Magnesium: 1.9 mg/dL (ref 1.7–2.4)

## 2023-06-01 LAB — AMMONIA: Ammonia: 29 umol/L (ref 9–35)

## 2023-06-01 MED ORDER — LACTATED RINGERS IV SOLN: INTRAVENOUS | Status: AC

## 2023-06-01 MED ORDER — MIDODRINE HCL 5 MG PO TABS
2.5000 mg | ORAL_TABLET | Freq: Two times a day (BID) | ORAL | Status: DC
  Administered 2023-06-01: 2.5 mg via ORAL
  Filled 2023-06-01: qty 1

## 2023-06-01 MED ORDER — CLOPIDOGREL BISULFATE 75 MG PO TABS
75.0000 mg | ORAL_TABLET | Freq: Every day | ORAL | Status: DC
  Administered 2023-06-01 – 2023-06-05 (×5): 75 mg via ORAL
  Filled 2023-06-01 (×5): qty 1

## 2023-06-01 MED ORDER — MIDODRINE HCL 5 MG PO TABS
5.0000 mg | ORAL_TABLET | Freq: Two times a day (BID) | ORAL | Status: DC
  Administered 2023-06-01: 5 mg via ORAL
  Filled 2023-06-01: qty 1

## 2023-06-01 NOTE — Procedures (Addendum)
 [name]  MRN: [id number]  Epilepsy Attending: Charlsie Quest  Referring Physician/Provider: Lynnae January, NP  Duration: 05/31/2023 1539 to 06/01/2023 1034  Patient history:  78 yo M with syncopal episode at the kitchen table and fell around at 1420. Now with Left-sided gaze preference, left arm and leg weakness and hemianopia of the left side. Also developed numbness of the left leg. EEG to evaluate for seizure   Level of alertness:  awake, asleep   AEDs during EEG study: LEV   Technical aspects: This EEG study was done with scalp electrodes positioned according to the 10-20 International system of electrode placement. Electrical activity was reviewed with band pass filter of 1-70Hz , sensitivity of 7 uV/mm, display speed of 4mm/sec with a 60Hz  notched filter applied as appropriate. EEG data were recorded continuously and digitally stored.  Video monitoring was available and reviewed as appropriate.   Description: The posterior dominant rhythm consists of 7.5 Hz activity of moderate voltage (25-35 uV) seen predominantly in posterior head regions, symmetric and reactive to eye opening and eye closing. EEG showed continuous generalized and maximal right parieto-occipital 3 to 6 Hz theta-delta slowing admixed with 13-15hz  beta activity. Sleep was characterized by vertex waves, sleep spindles (12 to 14 Hz), maximal frontocentral region. Hyperventilation and photic stimulation were not performed.      ABNORMALITY -Continuous slow, generalized and maximal right parieto-occipital region   IMPRESSION: This study is suggestive of cortical dysfunction in right parieto-occipital region likely secondary to underlying encephalomalacia. Additionally there is moderate diffuse encephalopathy. No seizures or epileptiform discharges were noted.   Jevaeh Shams Annabelle Harman

## 2023-06-01 NOTE — Progress Notes (Signed)
 [facility] PROGRESS NOTE   Danny Patterson ZOX:[id number] DOB: 1945/04/20 DOA: 05/26/2023  PCP: Smitty Cords, DO  Brief History:  78 y.o. male with medical history significant of HTN, stable BPH, could not tolerate Flomax, gout, lumbar fusion, history of kidney stone presents from home with syncopal episode.  He was found to have left-sided gaze preference and left arm and leg weakness.  Presented as a code stroke.  Apparently also had a shaking episode.  Seen by neurology.  Concern was for seizure activity.  He was hospitalized for further management.   Consultants: Neurology  Procedures:  Continuous EEG shows nonspecific encephalopathy.  No active seizures. No ultrasound.  Nonacute.   Subjective: Patient in bed, appears comfortable, denies any headache, no fever, no chest pain or pressure, no shortness of breath , no abdominal pain. No new focal weakness.   Assessment/Plan:   Seizure/acute metabolic encephalopathy  Thought to have seizure activity.  Did have left-sided symptoms.  Initially thought to be stroke.  CT head and MRI brain stable, initially was on Keppra but due to encephalopathy switched to Depakote with good effect.  Echocardiogram nonacute as well.  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  It is possible that patient has old right parietal injury and due to times of stress i.e. lack of blood supply, hypoperfusion, hypotension, hypoglycemia, fever, infection he has excitable focus giving him focal seizure.  Most of his seizures affect his left arm.  He has been adequately hydrated, placed on midodrine, TED stockings, overall much better.  Neuro on board.  \ Orthostatic hypotension.  Hydrate, TED stockings, low-dose midodrine and monitor.  AKI versus chronic kidney  disease stage IIIa   No old labs available.  Creatinine was 1.67 on admission creatinine fluctuating between 1.3 and 1.6, gentle hydration and monitor outpatient nephrology follow-up unremarkable renal ultrasound.  Normocytic anemia  No evidence of overt bleeding.  Continue to monitor.  Essential hypertension - Noted to be on amlodipine.  Will add blood pressure medications once MRI done and stroke rule out.  Hyperlipidemia - Continue with statin.  Hypokalemia and hypomagnesemia.  Replaced.    History of gout   Continue with allopurinol.  Colchicine on hold.  Obesity Estimated body mass index is 33.63 kg/m as calculated from the following:   Height as of this encounter: 5\' 10"  (1.778 m).   Weight as of this encounter: 106.3 kg.  DVT Prophylaxis: Subcutaneous heparin Code Status: Full code Family Communication:  daughter  Lyla Son phone [phone] on 05/30/2022, 06/01/2023 Disposition Plan: To be determined.  PT OT SLP evaluation  Status is: Inpatient Remains inpatient appropriate because: Acute encephalopathy, concern for seizure activity  Medications: Scheduled:  allopurinol  300 mg Oral Daily   aspirin EC  81 mg Oral Daily   divalproex  750 mg Oral Q12H   heparin  5,000 Units Subcutaneous Q8H   midodrine  2.5 mg Oral BID WC   multivitamin with minerals  1 tablet Oral Daily   QUEtiapine  25 mg Oral BID   Continuous:  lactated ringers       FAO:ZHYQMVHQIONGE **OR** acetaminophen, albuterol, haloperidol lactate, hydrALAZINE, ondansetron **OR** ondansetron (ZOFRAN) IV, traMADol   Objective:  Vital Signs  Vitals:   05/31/23 2000 05/31/23 2326 06/01/23 0436 06/01/23 0800  BP: (!) 137/58 (!) 107/46 Marland Kitchen)  152/71 (!) 144/75  Pulse: 65 66  82  Resp: 18 15 (!) 23 (!) 22  Temp:  98.1 F (36.7 C) 98.3 F (36.8 C) 98.3 F (36.8 C)  TempSrc:  Oral Oral Oral  SpO2: 100% 93% 96% 93%  Weight:      Height:        Intake/Output Summary (Last 24 hours) at 06/01/2023 0949 Last  data filed at 06/01/2023 0500 Gross per 24 hour  Intake 1642.99 ml  Output 1900 ml  Net -257.01 ml   Filed Weights   05/26/23 1608 05/27/23 0455 05/30/23 2346  Weight: 107.5 kg 111.9 kg 106.3 kg    Exam  Awake minimally confused, No new F.N deficits, Normal affect [place].AT,PERRAL Supple Neck, No JVD,   Symmetrical Chest wall movement, Good air movement bilaterally, CTAB RRR,No Gallops, Rubs or new Murmurs,  +ve B.Sounds, Abd Soft, No tenderness,   No Cyanosis, Clubbing or edema   Lab Results:  Data Reviewed: I have personally reviewed following labs and reports of the imaging studies  CBC: Recent Labs  Lab 05/26/23 1511 05/26/23 1515 05/27/23 0546 05/29/23 0847 05/30/23 0525 05/31/23 0430 06/01/23 0443  WBC 7.3  --  7.7 7.7 6.9 6.9 7.0  NEUTROABS 4.3  --   --  4.9 4.7 4.0 4.2  HGB 12.9*   < > 12.2* 13.5 13.0 13.0 12.1*  HCT 38.0*   < > 35.6* 38.6* 37.5* 37.6* 35.1*  MCV 93.6  --  92.5 90.4 92.1 91.9 91.6  PLT 162  --  150 168 165 169 159   < > = values in this interval not displayed.    Basic Metabolic Panel: Recent Labs  Lab 05/27/23 0546 05/29/23 0847 05/30/23 0525 05/31/23 0430 06/01/23 0443  NA 135 141 139 138 135  K 3.8 3.4* 3.8 3.9 3.8  CL 105 102 100 101 103  CO2 25 26 27 25 25   GLUCOSE 124* 89 90 91 87  BUN 20 8 13  25* 29*  CREATININE 1.33* 1.08 1.21 1.66* 1.57*  CALCIUM 8.7* 9.7 9.4 9.2 9.0  MG  --  1.6* 2.3 2.2 1.9  PHOS  --  2.5 3.6 5.3* 3.6    GFR: Estimated Creatinine Clearance: 47.3 mL/min (A) (by C-G formula based on SCr of 1.57 mg/dL (H)).  Liver Function Tests: Recent Labs  Lab 05/27/23 0546 05/29/23 0847 05/30/23 0525 05/31/23 0430 06/01/23 0443  AST 33 52* 46* 35 31  ALT 19 22 22 20 19   ALKPHOS 66 60 62 59 55  BILITOT 0.5 0.9 0.9 0.6 0.7  PROT 5.9* 6.0* 5.9* 5.7* 5.6*  ALBUMIN 3.4* 3.6 3.2* 3.0* 3.0*     Coagulation Profile: Recent Labs  Lab 05/26/23 1511  INR 1.1    CBG: Recent Labs  Lab 05/26/23 1510  05/31/23 1315 05/31/23 1949 06/01/23 0756  GLUCAP 80 88 130* 86     Radiology Studies: Overnight EEG with video Result Date: 06/01/2023 Charlsie Quest, MD     06/01/2023  8:42 AM [name] MRN: [id number] Epilepsy Attending: Charlsie Quest Referring Physician/Provider: Lynnae January, NP Duration: 05/31/2023 1539 to 06/01/2023 0841 Patient history:  78 yo M with syncopal episode at the kitchen table and fell around at 1420. Now with Left-sided gaze preference, left arm and leg weakness and hemianopia of the left side. Also developed numbness of the left leg. EEG to evaluate for seizure  Level of alertness:  awake, asleep  AEDs during EEG study: LEV  Technical aspects: This EEG study was done with scalp electrodes positioned according to the 10-20 International system of electrode placement. Electrical activity was reviewed with band pass filter of 1-70Hz , sensitivity of 7 uV/mm, display speed of 66mm/sec with a 60Hz  notched filter applied as appropriate. EEG data were recorded continuously and digitally stored.  Video monitoring was available and reviewed as appropriate.  Description: The posterior dominant rhythm consists of 7.5 Hz activity of moderate voltage (25-35 uV) seen predominantly in posterior head regions, symmetric and reactive to eye opening and eye closing. EEG showed continuous generalized and maximal right parieto-occipital 3 to 6 Hz theta-delta slowing admixed with 13-15hz  beta activity. Sleep was characterized by vertex waves, sleep spindles (12 to 14 Hz), maximal frontocentral region. Hyperventilation and photic stimulation were not performed.   Eeg was disconnected between 05/31/2023 1710 to 06/01/2023 0155  ABNORMALITY -Continuous slow, generalized and maximal right parieto-occipital region  IMPRESSION: This study is suggestive of cortical dysfunction in right parieto-occipital region likely secondary to underlying encephalomalacia. Additionally there is moderate diffuse  encephalopathy. No seizures or epileptiform discharges were noted.  Priyanka Annabelle Harman   US RENAL Result Date: 06/01/2023 CLINICAL DATA:  Acute kidney injury EXAM: RENAL / URINARY TRACT ULTRASOUND COMPLETE COMPARISON:  CT 05/27/2022 FINDINGS: Right Kidney: Renal measurements: 11.8 by 5.5 x 4.9 cm = volume: 166.5 mL. Echogenicity within normal limits. No mass or hydronephrosis visualized. Left Kidney: Renal measurements: 10.1 x 5.8 x 4.4 cm = volume: 135 mL. Echogenicity within normal limits. No mass or hydronephrosis visualized. Bladder: Appears normal for degree of bladder distention. Other: None. IMPRESSION: Negative renal ultrasound. Electronically Signed   By: Jasmine Pang M.D.   On: 06/01/2023 00:07   CT ANGIO HEAD NECK W WO CM (CODE STROKE) Result Date: 05/31/2023 CLINICAL DATA:  Code stroke. 78 year old male with weakness. Leftward gaze. EXAM: CT ANGIOGRAPHY HEAD AND NECK TECHNIQUE: Multidetector CT imaging of the head and neck was performed using the standard protocol during bolus administration of intravenous contrast. Multiplanar CT image reconstructions and MIPs were obtained to evaluate the vascular anatomy. Carotid stenosis measurements (when applicable) are obtained utilizing NASCET criteria, using the distal internal carotid diameter as the denominator. RADIATION DOSE REDUCTION: This exam was performed according to the departmental dose-optimization program which includes automated exposure control, adjustment of the mA and/or kV according to patient size and/or use of iterative reconstruction technique. CONTRAST:  75mL OMNIPAQUE IOHEXOL 350 MG/ML SOLN COMPARISON:  Recent CTA head and neck 05/26/2023. FINDINGS: CTA NECK Skeleton: Stable. Diffuse idiopathic skeletal hyperostosis (DISH). Bulky cervical spine degeneration. Upper chest: Stable.  Mild respiratory motion. Other neck: Neck soft tissue contours are within normal limits. Aortic arch: 3 vessel arch not completely visible today. Stable mild  arch tortuosity, minimal atherosclerosis. Right carotid system: Patent and stable. Relatively mild proximal right ICA calcified plaque. Left carotid system: Patent and stable. Relatively mild left carotid bifurcation plaque. Vertebral arteries: Patent and stable. Codominant vertebral arteries with tortuosity but no significant plaque. CTA HEAD Posterior circulation: Patent and stable. Mild for age posterior circulation atherosclerosis with no significant stenosis. Anterior circulation: Both ICA siphons are patent with motion artifact at the supraclinoid segments. Bilateral siphon calcified plaque. Mild to moderate supraclinoid stenosis on the right, no definite stenosis on the left. Patent carotid termini, MCA and ACA origins. Normal anterior communicating artery. Bilateral ACA and MCA branches are stable with no significant stenosis when allowing for mild motion. Venous sinuses: Early contrast timing, not well evaluated. Anatomic variants: None significant. Review  of the MIP images confirms the above findings IMPRESSION: 1. No large vessel occlusion. Stable CTA from 05/26/2023 with mild for age atherosclerosis in the head and neck. 2. 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 No other significant is stenosis identified. Electronically Signed   By: Odessa Fleming M.D.   On: 05/31/2023 13:54   CT HEAD CODE STROKE WO CONTRAST Result Date: 05/31/2023 CLINICAL DATA:  Code stroke. 78 year old male with weakness. Leftward gaze. EXAM: CT HEAD WITHOUT CONTRAST TECHNIQUE: Contiguous axial images were obtained from the base of the skull through the vertex without intravenous contrast. RADIATION DOSE REDUCTION: This exam was performed according to the departmental dose-optimization program which includes automated exposure control, adjustment of the mA and/or kV according to patient size and/or use of iterative reconstruction technique. COMPARISON:  Brain MRI yesterday.  Head CT 05/26/2023. FINDINGS:  Brain: No midline shift, mass effect, or evidence of intracranial mass lesion. No acute intracranial hemorrhage identified. No ventriculomegaly. Stable gray-white matter differentiation throughout the brain. Confluent right parietal lobe subcortical and white matter hypodensity appears stable from FLAIR signal changes yesterday. Extensive chronic microhemorrhages and hemosiderin in the brain associated. No cortically based acute infarct identified. Vascular: Calcified atherosclerosis at the skull base. No suspicious intracranial vascular hyperdensity. Skull: Stable, intact. Sinuses/Orbits: Visualized paranasal sinuses and mastoids are stable and well aerated. Other: No significant gaze deviation on these images. Visualized scalp soft tissues are within normal limits. ASPECTS [facility] Stroke Program Early CT Score) Total score (0-10 with 10 being normal): 10 IMPRESSION: 1. Stable non contrast CT appearance of the brain. No acute cortically based infarct or acute intracranial hemorrhage identified. ASPECTS 10. 2. Confluent right parietal lobe hypodensity stable from MRI yesterday. Query acute versus chronic amyloid related inflammation given that appearance. Salient findings were communicated to Dr. Amada Jupiter at 1:46 pm on 05/31/2023 by text page via the [facility] messaging system. Electronically Signed   By: Odessa Fleming M.D.   On: 05/31/2023 13:47   MR BRAIN WO CONTRAST Result Date: 05/30/2023 CLINICAL DATA:  Seizure disorder EXAM: MRI HEAD WITHOUT CONTRAST TECHNIQUE: Multiplanar, multiecho pulse sequences of the brain and surrounding structures were obtained without intravenous contrast. COMPARISON:  None Available. FINDINGS: Brain: No acute infarct, mass effect or extra-axial collection. Numerous chronic microhemorrhages in a predominantly peripheral distribution. Chronic subarachnoid blood over the left frontal lobe and medial right parietal lobe. There is multifocal hyperintense T2-weighted signal within the white  matter. Generalized volume loss. Old right parietal infarct. The midline structures are normal. Vascular: Normal flow voids. Skull and upper cervical spine: Normal calvarium and skull base. Visualized upper cervical spine and soft tissues are normal. Sinuses/Orbits:No paranasal sinus fluid levels or advanced mucosal thickening. No mastoid or middle ear effusion. Normal orbits. IMPRESSION: 1. No acute intracranial abnormality. 2. Numerous chronic microhemorrhages in a predominantly peripheral distribution, most consistent with cerebral amyloid angiopathy. 3. 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 Electronically Signed   By: Deatra Robinson M.D.   On: 05/30/2023 19:08   ECHOCARDIOGRAM COMPLETE Result Date: 05/30/2023    ECHOCARDIOGRAM REPORT   Patient Name:   Danny Patterson Date of Exam: 05/30/2023 Medical Rec #:  [id number]   Height:       70.0 in Accession #:    [id number]  Weight:       246.7 lb Date of Birth:  23-Nov-1945   BSA:          2.282 m Patient Age:  78 years    BP:           172/83 mmHg Patient Gender: M           HR:           82 bpm. Exam Location:  Inpatient Procedure: 2D Echo, Cardiac Doppler, Color Doppler and Intracardiac            Opacification Agent (Both Spectral and Color Flow Doppler were            utilized during procedure). Indications:    Stroke  History:        Patient has prior history of Echocardiogram examinations, most                 recent 10/08/2015.  Sonographer:    Karma Ganja Referring Phys: [phone] MCNEILL P KIRKPATRICK  Sonographer Comments: Technically difficult study due to poor echo windows and no subcostal window. Image acquisition challenging due to patient body habitus and Image acquisition challenging due to uncooperative patient. IMPRESSIONS  1. Left ventricular ejection fraction, by estimation, is 60 to 65%. The left ventricle has normal function. The left ventricle has no regional wall motion abnormalities. There is mild concentric left  ventricular hypertrophy. Left ventricular diastolic function could not be evaluated.  2. Right ventricular systolic function is normal. The right ventricular size is normal.  3. The mitral valve is normal in structure. No evidence of mitral valve regurgitation. No evidence of mitral stenosis.  4. The aortic valve has an indeterminant number of cusps. Aortic valve regurgitation is not visualized. No aortic stenosis is present.  5. The inferior vena cava is normal in size with greater than 50% respiratory variability, suggesting right atrial pressure of 3 mmHg. FINDINGS  Left Ventricle: Left ventricular ejection fraction, by estimation, is 60 to 65%. The left ventricle has normal function. The left ventricle has no regional wall motion abnormalities. Definity contrast agent was given IV to delineate the left ventricular  endocardial borders. The left ventricular internal cavity size was normal in size. There is mild concentric left ventricular hypertrophy. Left ventricular diastolic function could not be evaluated due to indeterminate diastolic function. Left ventricular diastolic function could not be evaluated. Right Ventricle: The right ventricular size is normal. No increase in right ventricular wall thickness. Right ventricular systolic function is normal. Left [facility]: Left atrial size was normal in size. Right [facility]: Right atrial size was normal in size. Pericardium: There is no evidence of pericardial effusion. Mitral Valve: The mitral valve is normal in structure. No evidence of mitral valve regurgitation. No evidence of mitral valve stenosis. Tricuspid Valve: The tricuspid valve is normal in structure. Tricuspid valve regurgitation is not demonstrated. No evidence of tricuspid stenosis. Aortic Valve: The aortic valve has an indeterminant number of cusps. Aortic valve regurgitation is not visualized. No aortic stenosis is present. Aortic valve mean gradient measures 4.0 mmHg. Aortic valve peak gradient  measures 7.2 mmHg. Aortic valve area, by VTI measures 2.33 cm. Pulmonic Valve: The pulmonic valve was not well visualized. Pulmonic valve regurgitation is not visualized. No evidence of pulmonic stenosis. Aorta: The aortic root is normal in size and structure. Venous: The inferior vena cava is normal in size with greater than 50% respiratory variability, suggesting right atrial pressure of 3 mmHg. IAS/Shunts: No atrial level shunt detected by color flow Doppler.  LEFT VENTRICLE PLAX 2D LVIDd:         5.60 cm      Diastology LVIDs:  2.70 cm      LV e' medial:    6.68 cm/s LV PW:         1.30 cm      LV E/e' medial:  8.0 LV IVS:        1.30 cm      LV e' lateral:   6.68 cm/s LVOT diam:     2.10 cm      LV E/e' lateral: 8.0 LV SV:         57 LV SV Index:   25 LVOT Area:     3.46 cm  LV Volumes (MOD) LV vol d, MOD A2C: 135.0 ml LV vol d, MOD A4C: 151.0 ml LV vol s, MOD A2C: 47.0 ml LV vol s, MOD A4C: 54.4 ml LV SV MOD A2C:     88.0 ml LV SV MOD A4C:     151.0 ml LV SV MOD BP:      92.2 ml LEFT ATRIUM           Index LA diam:      4.40 cm 1.93 cm/m LA Vol (A4C): 88.4 ml 38.74 ml/m  AORTIC VALVE AV Area (Vmax):    2.46 cm AV Area (Vmean):   2.25 cm AV Area (VTI):     2.33 cm AV Vmax:           134.00 cm/s AV Vmean:          90.500 cm/s AV VTI:            0.247 m AV Peak Grad:      7.2 mmHg AV Mean Grad:      4.0 mmHg LVOT Vmax:         95.00 cm/s LVOT Vmean:        58.700 cm/s LVOT VTI:          0.166 m LVOT/AV VTI ratio: 0.67  AORTA Ao Root diam: 3.40 cm Ao Asc diam:  3.30 cm MITRAL VALVE MV Area (PHT): 2.87 cm    SHUNTS MV Decel Time: 264 msec    Systemic VTI:  0.17 m MV E velocity: 53.60 cm/s  Systemic Diam: 2.10 cm MV A velocity: 82.00 cm/s MV E/A ratio:  0.65 Aditya Sabharwal Electronically signed by Dorthula Nettles Signature Date/Time: 05/30/2023/12:01:41 PM    Final      LOS: 5 days   Signature  -    Susa Raring M.D on 06/01/2023 at 9:49 AM   -  To page go to [email or web address]

## 2023-06-01 NOTE — Progress Notes (Signed)
 Discontinue vLTM monitoring  [facility] notified.  No skin breakdown noted at all skin sites

## 2023-06-01 NOTE — Progress Notes (Signed)
 Physical Therapy Treatment Patient Details Name: Danny Patterson MRN: [id number] DOB: 1945-09-11 Today's Date: 06/01/2023   History of Present Illness Pt is a 78 y.o. M who presents with left sided gaze preference and weakness. Presented as code stroke. CT head negative for acute abnormality. Now being worked up for seizure. 3/11 episode of decr responsiveness; head CT negative. Some tremulous activity with ?seizure but also hypotensive. EEG pending Significant PMH: HTN, stable BPH, gout, lumbar fusion, history of kidney stone.    PT Comments  Patient required incr assist compared to 3/11 (was seen prior to episode of ?seizure/hypotension). Greater difficulty following instructions and initiating movement. Orthostatics were negative. On EEG and with difficulty following instructional commands for transfer to chair, ambulation was not attempted (pt becoming mildly agitated with therapist cuing him).     If plan is discharge home, recommend the following: A lot of help with walking and/or transfers;A lot of help with bathing/dressing/bathroom;Assistance with cooking/housework;Direct supervision/assist for medications management;Direct supervision/assist for financial management;Assist for transportation;Help with stairs or ramp for entrance;Supervision due to cognitive status   Can travel by private vehicle        Equipment Recommendations  BSC/3in1;Wheelchair (measurements PT);Wheelchair cushion (measurements PT)    Recommendations for Other Services       Precautions / Restrictions Precautions Precautions: Fall;Other (comment) Precaution/Restrictions Comments: EEG, multiple lines Restrictions Weight Bearing Restrictions Per Provider Order: No     Mobility  Bed Mobility Overal bed mobility: Needs Assistance Bed Mobility: Supine to Sit     Supine to sit: HOB elevated, Mod assist, +2 for physical assistance     General bed mobility comments: pt required assist to initiate movement of  legs over EOB, to raise torso and to scoot hips out to EOB with use of pad    Transfers Overall transfer level: Needs assistance Equipment used: 1 person hand held assist, Rolling walker (2 wheels) Transfers: Sit to/from Stand, Bed to chair/wheelchair/BSC Sit to Stand: Mod assist, +2 physical assistance   Step pivot transfers: Min assist, +2 physical assistance       General transfer comment: initial stand with HHA for orthostatics with pt very nervous and reaching with other UE for support (tech unavailable); second time stood with RW, very slow step pivot around to chair on his left (decr following directional commands)    Ambulation/Gait Ambulation/Gait assistance:  (+3 for chair follow)             General Gait Details: limited by EEG and decr following commands once standing   Stairs             Wheelchair Mobility     Tilt Bed    Modified Rankin (Stroke Patients Only)       Balance Overall balance assessment: Needs assistance Sitting-balance support: Feet supported Sitting balance-Leahy Scale: Fair     Standing balance support: Bilateral upper extremity supported Standing balance-Leahy Scale: Poor Standing balance comment: reliant on BUE support during synamic tasks, able to maintain standing at sink with single UE support to brush hair                            Communication Communication Communication: Impaired Factors Affecting Communication: Difficulty expressing self  Cognition Arousal: Alert Behavior During Therapy: Flat affect   PT - Cognitive impairments: Awareness, Attention, Memory, Initiation, Sequencing, Problem solving, Safety/Judgement   Orientation impairments: Situation (did not recall events of 3/11)  PT - Cognition Comments: awake and alert throughout session; difficulty following commands to step left or step back; argumentative over which direction was backwards stating PT was in his way  when standing in front of him Following commands: Impaired Following commands impaired: Follows one step commands with increased time, Follows multi-step commands inconsistently    Cueing Cueing Techniques: Verbal cues, Tactile cues, Gestural cues  Exercises General Exercises - Lower Extremity Ankle Circles/Pumps: AROM, Both, 5 reps (noted limited AROM, states present for years)    General Comments General comments (skin integrity, edema, etc.): see vitals flowsheet for orthostatic BPs (-)      Pertinent Vitals/Pain Pain Assessment Pain Assessment: Faces Faces Pain Scale: Hurts little more Pain Location: chronic back pain Pain Descriptors / Indicators: Grimacing, Guarding Pain Intervention(s): Limited activity within patient's tolerance, Monitored during session    Home Living                          Prior Function            PT Goals (current goals can now be found in the care plan section) Acute Rehab PT Goals Patient Stated Goal: pt daughter would like for him to walk Time For Goal Achievement: 06/11/23 Potential to Achieve Goals: Good Progress towards PT goals: Not progressing toward goals - comment (mild setback since 3/11 and now on EEG monitor)    Frequency    Min 3X/week      PT Plan      Co-evaluation              AM-PAC PT "6 Clicks" Mobility   Outcome Measure  Help needed turning from your back to your side while in a flat bed without using bedrails?: A Lot Help needed moving from lying on your back to sitting on the side of a flat bed without using bedrails?: Total Help needed moving to and from a bed to a chair (including a wheelchair)?: Total Help needed standing up from a chair using your arms (e.g., wheelchair or bedside chair)?: Total Help needed to walk in hospital room?: Total Help needed climbing 3-5 steps with a railing? : Total 6 Click Score: 7    End of Session Equipment Utilized During Treatment: Gait belt Activity  Tolerance: Patient limited by fatigue Patient left: in chair;with call bell/phone within reach;with chair alarm set (with door open and RN aware of pt location) Nurse Communication: Mobility status PT Visit Diagnosis: Unsteadiness on feet (R26.81);History of falling (Z91.81);Difficulty in walking, not elsewhere classified (R26.2)     Time: 1610-9604 PT Time Calculation (min) (ACUTE ONLY): 36 min  Charges:    $Gait Training: 8-22 mins $Therapeutic Activity: 8-22 mins PT General Charges $$ ACUTE PT VISIT: 1 Visit                      Jerolyn Center, PT Acute Rehabilitation Services  Office [phone]    Zena Amos 06/01/2023, 10:04 AM

## 2023-06-01 NOTE — Progress Notes (Signed)
 Subjective: Had an episode of ams with left gaze preference yesterday with MAPs in 50 and systolic BP as low as 54  ROS: negative except above  Examination  Vital signs in last 24 hours: Temp:  [97.5 F (36.4 C)-98.5 F (36.9 C)] 98.3 F (36.8 C) (03/12 0800) Pulse Rate:  [65-82] 82 (03/12 0800) Resp:  [14-23] 22 (03/12 0800) BP: (90-160)/(45-87) 144/75 (03/12 0800) SpO2:  [93 %-100 %] 93 % (03/12 0800)  General: sitting in chair, NAD Neuro: AOx3, follows commands, able to name objects, cranial nerves appear grossly intact, 4/5 in all 4 extremities  Basic Metabolic Panel: Recent Labs  Lab 05/27/23 0546 05/29/23 0847 05/30/23 0525 05/31/23 0430 06/01/23 0443  NA 135 141 139 138 135  K 3.8 3.4* 3.8 3.9 3.8  CL 105 102 100 101 103  CO2 25 26 27 25 25   GLUCOSE 124* 89 90 91 87  BUN 20 8 13  25* 29*  CREATININE 1.33* 1.08 1.21 1.66* 1.57*  CALCIUM 8.7* 9.7 9.4 9.2 9.0  MG  --  1.6* 2.3 2.2 1.9  PHOS  --  2.5 3.6 5.3* 3.6    CBC: Recent Labs  Lab 05/26/23 1511 05/26/23 1515 05/27/23 0546 05/29/23 0847 05/30/23 0525 05/31/23 0430 06/01/23 0443  WBC 7.3  --  7.7 7.7 6.9 6.9 7.0  NEUTROABS 4.3  --   --  4.9 4.7 4.0 4.2  HGB 12.9*   < > 12.2* 13.5 13.0 13.0 12.1*  HCT 38.0*   < > 35.6* 38.6* 37.5* 37.6* 35.1*  MCV 93.6  --  92.5 90.4 92.1 91.9 91.6  PLT 162  --  150 168 165 169 159   < > = values in this interval not displayed.     Coagulation Studies: No results for input(s): "LABPROT", "INR" in the last 72 hours.  Imaging personally reviewed  CTH wo contrast 05/31/2023: Stable non contrast CT appearance of the brain. No acute cortically based infarct or acute intracranial hemorrhage identified. ASPECTS 10. Confluent right parietal lobe hypodensity stable from MRI yesterday. Query acute versus chronic amyloid related inflammation given that appearance.  CTA H/N w and wo contrast 05/31/2023:  No large vessel occlusion. Stable CTA from 05/26/2023 with mild for age  atherosclerosis in the head and neck. 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 No other significant is stenosis identified.   ASSESSMENT AND PLAN: 78 y.o. male who presents with new onset focal status epilepticus.  He continues to be quite delirious, and does admit to drinking about six beers a day.  I will check a thiamine level and start high-dose thiamine.  In addition, Keppra can sometimes to be deliriogenic and therefore I will change from Keppra to Depakote.  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  -Rescue medication: Clonazepam 2 mg for seizure lasting more than 2 minutes.  [employer] co-pay is over thousand dollars -Continue aspirin 81 mg daily.  LDL 63.  -  Continue seizure precautions -As needed IV Ativan for clinical seizures -Follow-up with neurology in 3 months (order placed)   Seizure precautions: Per [facility] statutes, patients with seizures are not allowed to drive until they have been seizure-free for six months and cleared by a physician    Use caution when using heavy equipment or power tools. Avoid working on ladders or at heights. Take showers instead of baths. Ensure the water temperature is not too high on the home water heater. Do not go swimming alone. Do not lock yourself in a room alone (i.e. bathroom). When caring for infants or small children, sit down when holding, feeding, or  changing them to minimize risk of injury to the child in the event you have a seizure. Maintain good sleep hygiene. Avoid alcohol.    If patient has another seizure, call 911 and bring them back to the ED if: A.  The seizure lasts longer than 5 minutes.      B.  The patient doesn't wake shortly after the seizure or has new problems such as difficulty seeing, speaking or moving following the seizure C.  The patient was injured during the seizure D.  The patient has a temperature over 102 F (39C) E.  The patient vomited during the seizure and now is having trouble breathing    During the Seizure   - First, ensure adequate ventilation and place patients on the floor on their left side  Loosen clothing around the neck and ensure the airway is patent. If the patient is clenching the teeth, do not force the mouth open with any object as this can cause severe damage - Remove all items from the surrounding that can be hazardous. The patient may be oblivious to what's happening and may not even know what he or she is doing. If the patient is confused and wandering, either gently guide him/her away and block access to outside areas - Reassure the individual and be comforting - Call 911. In most cases, the seizure ends before EMS arrives. However, there are cases when seizures may last over 3 to 5 minutes. Or the individual may have developed breathing difficulties or severe injuries. If a pregnant patient or a person with diabetes develops a seizure, it is prudent to call an ambulance.      After the Seizure (Postictal Stage)   After a seizure, most patients experience confusion, fatigue, muscle pain and/or a headache. Thus, one should permit the individual to sleep. For the next few days, reassurance is essential. Being calm and helping reorient the person is also of importance.   Most seizures are painless and end spontaneously. Seizures are not harmful to others but can lead to complications such as  stress on the lungs, brain and the heart. Individuals with prior lung problems may develop labored breathing and respiratory distress.        I have spent a total of  38 minutes with the patient reviewing hospital notes,  test results, labs and examining the patient as well as establishing an assessment and plan that was discussed personally with the patient.  > 50% of time was spent in direct patient care.    Lindie Spruce Epilepsy [facility] For questions after 5pm please refer to AMION to reach the Neurologist on call

## 2023-06-01 NOTE — Progress Notes (Signed)
LTM maint complete - no skin breakdown under: FP1,F7   

## 2023-06-01 NOTE — Progress Notes (Signed)
 Inpatient Rehab Admissions Coordinator:    CIR following. Note change in status yesterday, with code Stroke called. Pt. Currently on LT EEG. Case withdrawn from insurance.  I will follow up and potentially resend case to insurance once medically stable.   Megan Salon, MS, CCC-SLP Rehab Admissions Coordinator  [phone] (celll) [phone] (office)

## 2023-06-01 NOTE — TOC Progression Note (Signed)
 Transition of Care [facility]) - Progression Note    Patient Details  Name: Danny Patterson MRN: [id number] Date of Birth: Jul 24, 1945  Transition of Care [facility]) CM/SW Contact  Mearl Latin, LCSW Phone Number: 06/01/2023, 1:39 PM  Clinical Narrative:    TOC continuing to follow.         Expected Discharge Plan and Services       Living arrangements for the past 2 months: Single Family Home                                       Social Determinants of Health (SDOH) Interventions SDOH Screenings   Food Insecurity: Patient Unable To Answer (05/27/2023)  Housing: Low Risk  (05/31/2023)  Transportation Needs: Patient Unable To Answer (05/27/2023)  Utilities: Patient Unable To Answer (05/27/2023)    Readmission Risk Interventions     No data to display

## 2023-06-01 NOTE — Plan of Care (Signed)

## 2023-06-01 NOTE — Progress Notes (Signed)
 LTM maint complete - no skin breakdown seen  Study is now monitored by [facility].

## 2023-06-02 DIAGNOSIS — R569 Unspecified convulsions: Secondary | ICD-10-CM | POA: Diagnosis not present

## 2023-06-02 LAB — CBC WITH DIFFERENTIAL/PLATELET
Abs Immature Granulocytes: 0.02 10*3/uL (ref 0.00–0.07)
Basophils Absolute: 0.1 10*3/uL (ref 0.0–0.1)
Basophils Relative: 1 %
Eosinophils Absolute: 0.3 10*3/uL (ref 0.0–0.5)
Eosinophils Relative: 5 %
HCT: 33.4 % — ABNORMAL LOW (ref 39.0–52.0)
Hemoglobin: 11.6 g/dL — ABNORMAL LOW (ref 13.0–17.0)
Immature Granulocytes: 0 %
Lymphocytes Relative: 34 %
Lymphs Abs: 2.2 10*3/uL (ref 0.7–4.0)
MCH: 31.6 pg (ref 26.0–34.0)
MCHC: 34.7 g/dL (ref 30.0–36.0)
MCV: 91 fL (ref 80.0–100.0)
Monocytes Absolute: 0.5 10*3/uL (ref 0.1–1.0)
Monocytes Relative: 8 %
Neutro Abs: 3.3 10*3/uL (ref 1.7–7.7)
Neutrophils Relative %: 52 %
Platelets: 162 10*3/uL (ref 150–400)
RBC: 3.67 MIL/uL — ABNORMAL LOW (ref 4.22–5.81)
RDW: 14.6 % (ref 11.5–15.5)
WBC: 6.4 10*3/uL (ref 4.0–10.5)
nRBC: 0 % (ref 0.0–0.2)

## 2023-06-02 LAB — COMPREHENSIVE METABOLIC PANEL
ALT: 19 U/L (ref 0–44)
AST: 29 U/L (ref 15–41)
Albumin: 2.7 g/dL — ABNORMAL LOW (ref 3.5–5.0)
Alkaline Phosphatase: 52 U/L (ref 38–126)
Anion gap: 9 (ref 5–15)
BUN: 32 mg/dL — ABNORMAL HIGH (ref 8–23)
CO2: 24 mmol/L (ref 22–32)
Calcium: 8.8 mg/dL — ABNORMAL LOW (ref 8.9–10.3)
Chloride: 104 mmol/L (ref 98–111)
Creatinine, Ser: 1.25 mg/dL — ABNORMAL HIGH (ref 0.61–1.24)
GFR, Estimated: 59 mL/min — ABNORMAL LOW (ref >60)
Glucose, Bld: 81 mg/dL (ref 70–99)
Potassium: 4.3 mmol/L (ref 3.5–5.1)
Sodium: 137 mmol/L (ref 135–145)
Total Bilirubin: 0.6 mg/dL (ref 0.0–1.2)
Total Protein: 5.1 g/dL — ABNORMAL LOW (ref 6.5–8.1)

## 2023-06-02 LAB — GLUCOSE, CAPILLARY: Glucose-Capillary: 85 mg/dL (ref 70–99)

## 2023-06-02 LAB — MAGNESIUM: Magnesium: 1.9 mg/dL (ref 1.7–2.4)

## 2023-06-02 LAB — BRAIN NATRIURETIC PEPTIDE: B Natriuretic Peptide: 69.8 pg/mL (ref 0.0–100.0)

## 2023-06-02 LAB — PHOSPHORUS: Phosphorus: 3 mg/dL (ref 2.5–4.6)

## 2023-06-02 LAB — AMMONIA: Ammonia: 36 umol/L — ABNORMAL HIGH (ref 9–35)

## 2023-06-02 LAB — VITAMIN B1: Vitamin B1 (Thiamine): 112.6 nmol/L (ref 66.5–200.0)

## 2023-06-02 MED ORDER — QUETIAPINE FUMARATE 25 MG PO TABS
25.0000 mg | ORAL_TABLET | Freq: Every day | ORAL | Status: DC
  Administered 2023-06-03: 25 mg via ORAL
  Filled 2023-06-02: qty 1

## 2023-06-02 NOTE — Progress Notes (Signed)
 IP rehab admissions - I have sent request to insurance carrier to open the case for potential acute inpatient rehab admission.  [phone]

## 2023-06-02 NOTE — Progress Notes (Addendum)
 [facility] PROGRESS NOTE   Kawika Bischoff WUJ:[id number] DOB: [birth year] DOA: 05/26/2023  PCP: Smitty Cords, DO  Brief History:  78 y.o. male with medical history significant of HTN, stable BPH, could not tolerate Flomax, gout, lumbar fusion, history of kidney stone presents from home with syncopal episode.  He was found to have left-sided gaze preference and left arm and leg weakness.  Presented as a code stroke.  Apparently also had a shaking episode.  Seen by neurology.  Concern was for seizure activity.  He was hospitalized for further management.   Consultants: Neurology  Procedures:  Continuous EEG shows nonspecific encephalopathy.  No active seizures. No ultrasound.  Nonacute.   Subjective: Patient in bed, appears comfortable, denies any headache, no fever, no chest pain or pressure, no shortness of breath , no abdominal pain. No focal weakness.   Assessment/Plan:   Seizure/acute metabolic encephalopathy  Thought to have seizure activity.  Did have left-sided symptoms.  Initially thought to be stroke.  CT head and MRI brain stable, initially was on Keppra but due to encephalopathy switched to Depakote with good effect.  Echocardiogram nonacute as well.  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  It is possible that patient has old right parietal injury and due to times of stress i.e. lack of blood supply, hypoperfusion, hypotension, hypoglycemia, fever, infection he has excitable focus giving him focal seizure.  Most of his seizures affect his left arm.  He has been adequately hydrated, placed on midodrine, TED stockings, overall much better.  Neuro on board.   Orthostatic hypotension.  Improved after IV fluids, TED stockings and low-dose midodrine, taper off midodrine and  monitor.  AKI versus chronic kidney disease stage IV.  Patient has a different chart under a different medical record number in the system, outpatient creatinine appears to be close to 1.7-1.9.  He is stable here to initial IV fluids for hydration.  Normocytic anemia  No evidence of overt bleeding.  Continue to monitor.  Essential hypertension - Noted to be on amlodipine.  Will add blood pressure medications once MRI done and stroke rule out.  Hyperlipidemia - Continue with statin.  Hypokalemia and hypomagnesemia.  Replaced.    History of gout   Continue with allopurinol.  Colchicine on hold.  Obesity Estimated body mass index is 33.63 kg/m as calculated from the following:   Height as of this encounter: 5\' 10"  (1.778 m).   Weight as of this encounter: 106.3 kg.  DVT Prophylaxis: Subcutaneous heparin Code Status: Full code Family Communication:  daughter  Lyla Son phone [phone] on 05/30/2022, 06/01/2023 Disposition Plan: To be determined.  PT OT SLP evaluation  Status is: Inpatient Remains inpatient appropriate because: Acute encephalopathy, concern for seizure activity  Medications: Scheduled:  allopurinol  300 mg Oral Daily   aspirin EC  81 mg Oral Daily   clopidogrel  75 mg Oral Daily   divalproex  750 mg Oral Q12H   heparin  5,000 Units Subcutaneous Q8H   multivitamin with minerals  1 tablet Oral Daily   QUEtiapine  25 mg Oral BID   Continuous:     HQI:[id number] **OR** acetaminophen, albuterol, haloperidol lactate, hydrALAZINE, ondansetron **OR** ondansetron (ZOFRAN) IV, traMADol   Objective:  Vital Signs  Vitals:   06/01/23 2121 06/01/23 2338 06/02/23 0420 06/02/23 0810  BP: (!) 155/68 124/62 138/73   Pulse:    67  Resp: 17 15 16 19   Temp: 98.5 F (36.9 C) 98.7 F (37.1 C) 98.7 F (37.1 C) 98.3 F (36.8 C)  TempSrc: Oral Oral Oral Oral  SpO2:      Weight:      Height:        Intake/Output Summary (Last 24 hours) at 06/02/2023 1023 Last data  filed at 06/02/2023 0600 Gross per 24 hour  Intake 933.37 ml  Output 1450 ml  Net -516.63 ml   Filed Weights   05/26/23 1608 05/27/23 0455 05/30/23 2346  Weight: 107.5 kg 111.9 kg 106.3 kg    Exam  Awake minimally confused, No new F.N deficits, Normal affect [place].AT,PERRAL Supple Neck, No JVD,   Symmetrical Chest wall movement, Good air movement bilaterally, CTAB RRR,No Gallops, Rubs or new Murmurs,  +ve B.Sounds, Abd Soft, No tenderness,   No Cyanosis, Clubbing or edema   Lab Results:  Data Reviewed: I have personally reviewed following labs and reports of the imaging studies  CBC: Recent Labs  Lab 05/29/23 0847 05/30/23 0525 05/31/23 0430 06/01/23 0443 06/02/23 0519  WBC 7.7 6.9 6.9 7.0 6.4  NEUTROABS 4.9 4.7 4.0 4.2 3.3  HGB 13.5 13.0 13.0 12.1* 11.6*  HCT 38.6* 37.5* 37.6* 35.1* 33.4*  MCV 90.4 92.1 91.9 91.6 91.0  PLT 168 165 169 159 162    Basic Metabolic Panel: Recent Labs  Lab 05/29/23 0847 05/30/23 0525 05/31/23 0430 06/01/23 0443 06/02/23 0519  NA 141 139 138 135 137  K 3.4* 3.8 3.9 3.8 4.3  CL 102 100 101 103 104  CO2 26 27 25 25 24   GLUCOSE 89 90 91 87 81  BUN 8 13 25* 29* 32*  CREATININE 1.08 1.21 1.66* 1.57* 1.25*  CALCIUM 9.7 9.4 9.2 9.0 8.8*  MG 1.6* 2.3 2.2 1.9 1.9  PHOS 2.5 3.6 5.3* 3.6 3.0    GFR: Estimated Creatinine Clearance: 59.5 mL/min (A) (by C-G formula based on SCr of 1.25 mg/dL (H)).  Liver Function Tests: Recent Labs  Lab 05/29/23 0847 05/30/23 0525 05/31/23 0430 06/01/23 0443 06/02/23 0519  AST 52* 46* 35 31 29  ALT 22 22 20 19 19   ALKPHOS 60 62 59 55 52  BILITOT 0.9 0.9 0.6 0.7 0.6  PROT 6.0* 5.9* 5.7* 5.6* 5.1*  ALBUMIN 3.6 3.2* 3.0* 3.0* 2.7*     Coagulation Profile: Recent Labs  Lab 05/26/23 1511  INR 1.1    CBG: Recent Labs  Lab 05/31/23 1315 05/31/23 1949 06/01/23 0756 06/01/23 2119 06/02/23 0803  GLUCAP 88 130* 86 123* 85     Radiology Studies: Overnight EEG with video Result Date:  06/01/2023 Charlsie Quest, MD     06/02/2023 10:13 AM Patient Name: Mehar Kirkwood MRN: [id number] Epilepsy Attending: Charlsie Quest Referring Physician/Provider: Lynnae January, NP Duration: 05/31/2023 1539 to 06/01/2023 1034 Patient history:  78 yo M with syncopal episode at the kitchen table and fell around at 1420. Now with Left-sided gaze preference, left arm and leg weakness and hemianopia of the left side. Also developed numbness of the left leg. EEG to evaluate for seizure  Level of alertness:  awake, asleep  AEDs during EEG study: LEV  Technical aspects: This EEG study was done with scalp electrodes positioned according to the 10-20 International system of electrode placement. Electrical activity was reviewed with band pass filter of 1-70Hz , sensitivity of 7 uV/mm, display speed of 59mm/sec with a 60Hz   notched filter applied as appropriate. EEG data were recorded continuously and digitally stored.  Video monitoring was available and reviewed as appropriate.  Description: The posterior dominant rhythm consists of 7.5 Hz activity of moderate voltage (25-35 uV) seen predominantly in posterior head regions, symmetric and reactive to eye opening and eye closing. EEG showed continuous generalized and maximal right parieto-occipital 3 to 6 Hz theta-delta slowing admixed with 13-15hz  beta activity. Sleep was characterized by vertex waves, sleep spindles (12 to 14 Hz), maximal frontocentral region. Hyperventilation and photic stimulation were not performed.    ABNORMALITY -Continuous slow, generalized and maximal right parieto-occipital region  IMPRESSION: This study is suggestive of cortical dysfunction in right parieto-occipital region likely secondary to underlying encephalomalacia. Additionally there is moderate diffuse encephalopathy. No seizures or epileptiform discharges were noted.  Priyanka Annabelle Harman   US RENAL Result Date: 06/01/2023 CLINICAL DATA:  Acute kidney injury EXAM: RENAL / URINARY TRACT  ULTRASOUND COMPLETE COMPARISON:  CT 05/27/2022 FINDINGS: Right Kidney: Renal measurements: 11.8 by 5.5 x 4.9 cm = volume: 166.5 mL. Echogenicity within normal limits. No mass or hydronephrosis visualized. Left Kidney: Renal measurements: 10.1 x 5.8 x 4.4 cm = volume: 135 mL. Echogenicity within normal limits. No mass or hydronephrosis visualized. Bladder: Appears normal for degree of bladder distention. Other: None. IMPRESSION: Negative renal ultrasound. Electronically Signed   By: Jasmine Pang M.D.   On: 06/01/2023 00:07   CT ANGIO HEAD NECK W WO CM (CODE STROKE) Result Date: 05/31/2023 CLINICAL DATA:  Code stroke. 78 year old male with weakness. Leftward gaze. EXAM: CT ANGIOGRAPHY HEAD AND NECK TECHNIQUE: Multidetector CT imaging of the head and neck was performed using the standard protocol during bolus administration of intravenous contrast. Multiplanar CT image reconstructions and MIPs were obtained to evaluate the vascular anatomy. Carotid stenosis measurements (when applicable) are obtained utilizing NASCET criteria, using the distal internal carotid diameter as the denominator. RADIATION DOSE REDUCTION: This exam was performed according to the departmental dose-optimization program which includes automated exposure control, adjustment of the mA and/or kV according to patient size and/or use of iterative reconstruction technique. CONTRAST:  75mL OMNIPAQUE IOHEXOL 350 MG/ML SOLN COMPARISON:  Recent CTA head and neck 05/26/2023. FINDINGS: CTA NECK Skeleton: Stable. Diffuse idiopathic skeletal hyperostosis (DISH). Bulky cervical spine degeneration. Upper chest: Stable.  Mild respiratory motion. Other neck: Neck soft tissue contours are within normal limits. Aortic arch: 3 vessel arch not completely visible today. Stable mild arch tortuosity, minimal atherosclerosis. Right carotid system: Patent and stable. Relatively mild proximal right ICA calcified plaque. Left carotid system: Patent and stable. Relatively  mild left carotid bifurcation plaque. Vertebral arteries: Patent and stable. Codominant vertebral arteries with tortuosity but no significant plaque. CTA HEAD Posterior circulation: Patent and stable. Mild for age posterior circulation atherosclerosis with no significant stenosis. Anterior circulation: Both ICA siphons are patent with motion artifact at the supraclinoid segments. Bilateral siphon calcified plaque. Mild to moderate supraclinoid stenosis on the right, no definite stenosis on the left. Patent carotid termini, MCA and ACA origins. Normal anterior communicating artery. Bilateral ACA and MCA branches are stable with no significant stenosis when allowing for mild motion. Venous sinuses: Early contrast timing, not well evaluated. Anatomic variants: None significant. Review of the MIP images confirms the above findings IMPRESSION: 1. No large vessel occlusion. Stable CTA from 05/26/2023 with mild for age atherosclerosis in the head and neck. 2. 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 No other significant is stenosis identified. Electronically Signed  By: Odessa Fleming M.D.   On: 05/31/2023 13:54   CT HEAD CODE STROKE WO CONTRAST Result Date: 05/31/2023 CLINICAL DATA:  Code stroke. 77 year old male with weakness. Leftward gaze. EXAM: CT HEAD WITHOUT CONTRAST TECHNIQUE: Contiguous axial images were obtained from the base of the skull through the vertex without intravenous contrast. RADIATION DOSE REDUCTION: This exam was performed according to the departmental dose-optimization program which includes automated exposure control, adjustment of the mA and/or kV according to patient size and/or use of iterative reconstruction technique. COMPARISON:  Brain MRI yesterday.  Head CT 05/26/2023. FINDINGS: Brain: No midline shift, mass effect, or evidence of intracranial mass lesion. No acute intracranial hemorrhage identified. No ventriculomegaly. Stable gray-white matter differentiation  throughout the brain. Confluent right parietal lobe subcortical and white matter hypodensity appears stable from FLAIR signal changes yesterday. Extensive chronic microhemorrhages and hemosiderin in the brain associated. No cortically based acute infarct identified. Vascular: Calcified atherosclerosis at the skull base. No suspicious intracranial vascular hyperdensity. Skull: Stable, intact. Sinuses/Orbits: Visualized paranasal sinuses and mastoids are stable and well aerated. Other: No significant gaze deviation on these images. Visualized scalp soft tissues are within normal limits. ASPECTS [facility] Stroke Program Early CT Score) Total score (0-10 with 10 being normal): 10 IMPRESSION: 1. Stable non contrast CT appearance of the brain. No acute cortically based infarct or acute intracranial hemorrhage identified. ASPECTS 10. 2. Confluent right parietal lobe hypodensity stable from MRI yesterday. Query acute versus chronic amyloid related inflammation given that appearance. Salient findings were communicated to Dr. Amada Jupiter at 1:46 pm on 05/31/2023 by text page via the [facility] messaging system. Electronically Signed   By: Odessa Fleming M.D.   On: 05/31/2023 13:47     LOS: 6 days   Signature  -    Susa Raring M.D on 06/02/2023 at 10:23 AM   -  To page go to [email or web address]

## 2023-06-02 NOTE — Plan of Care (Signed)
  Problem: Education: Goal: Knowledge of General Education information will improve Description: Including pain rating scale, medication(s)/side effects and non-pharmacologic comfort measures Outcome: Progressing   Problem: Clinical Measurements: Goal: Ability to maintain clinical measurements within normal limits will improve Outcome: Progressing Goal: Will remain free from infection Outcome: Progressing Goal: Diagnostic test results will improve Outcome: Progressing Goal: Respiratory complications will improve Outcome: Progressing Goal: Cardiovascular complication will be avoided Outcome: Progressing   Problem: Activity: Goal: Risk for activity intolerance will decrease Outcome: Progressing   Problem: Nutrition: Goal: Adequate nutrition will be maintained Outcome: Progressing   Problem: Coping: Goal: Level of anxiety will decrease Outcome: Progressing   Problem: Elimination: Goal: Will not experience complications related to bowel motility Outcome: Progressing Goal: Will not experience complications related to urinary retention Outcome: Progressing   Problem: Pain Managment: Goal: General experience of comfort will improve and/or be controlled Outcome: Progressing   Problem: Safety: Goal: Ability to remain free from injury will improve Outcome: Progressing   Problem: Skin Integrity: Goal: Risk for impaired skin integrity will decrease Outcome: Progressing   Problem: Safety: Goal: Non-violent Restraint(s) Outcome: Progressing

## 2023-06-02 NOTE — Progress Notes (Signed)
 Occupational Therapy Treatment Patient Details Name: Danny Patterson MRN: [id number] DOB: 1946-03-11 Today's Date: 06/02/2023   History of present illness Pt is a 78 y.o. M who presents with left sided gaze preference and weakness. Presented as code stroke. CT head negative for acute abnormality. Now being worked up for seizure. 3/11 episode of decr responsiveness; head CT negative. Some tremulous activity with ?seizure but also hypotensive. EEG pending Significant PMH: HTN, stable BPH, gout, lumbar fusion, history of kidney stone.   OT comments  Patient had made good gains with OT treatment. Mod assist of 1 from mod assist of 2 for supine to sit. Patient required assist of 2 for transfer to recliner due to flexed posture and difficulty advancing feet. Self care tasks performed seated in recliner with cues for sequencing. Patient will benefit from intensive inpatient follow-up therapy, >3 hours/day. Acute OT to continue to follow to address established goals to facilitate DC to next venue of care.       If plan is discharge home, recommend the following:  A lot of help with walking and/or transfers;Two people to help with walking and/or transfers;A lot of help with bathing/dressing/bathroom;Two people to help with bathing/dressing/bathroom   Equipment Recommendations  Other (comment) (TBD pending on progress)    Recommendations for Other Services      Precautions / Restrictions Precautions Precautions: Fall;Other (comment) Recall of Precautions/Restrictions: Impaired Restrictions Weight Bearing Restrictions Per Provider Order: No       Mobility Bed Mobility Overal bed mobility: Needs Assistance Bed Mobility: Supine to Sit     Supine to sit: HOB elevated, Used rails, Mod assist     General bed mobility comments: mod assist of one with cues throughout and increased time    Transfers Overall transfer level: Needs assistance Equipment used: Rolling walker (2 wheels) Transfers: Sit  to/from Stand, Bed to chair/wheelchair/BSC Sit to Stand: Mod assist, +2 physical assistance     Step pivot transfers: Mod assist, +2 physical assistance     General transfer comment: cues throughout for safety and technique, cues for hand placement     Balance Overall balance assessment: Needs assistance Sitting-balance support: Feet supported Sitting balance-Leahy Scale: Fair Sitting balance - Comments: supervison   Standing balance support: Bilateral upper extremity supported Standing balance-Leahy Scale: Poor Standing balance comment: reliant on RW for support                           ADL either performed or assessed with clinical judgement   ADL Overall ADL's : Needs assistance/impaired     Grooming: Wash/dry hands;Wash/dry face;Oral care;Brushing hair;Cueing for sequencing;Cueing for UE precautions;Supervision/safety Grooming Details (indicate cue type and reason): required cues for sequencing, perseverated on brushing teeth and combing hair                 Toilet Transfer: Moderate assistance;+2 for physical assistance;Rolling walker (2 wheels) Toilet Transfer Details (indicate cue type and reason): simulated to recliner                Extremity/Trunk Assessment              Vision       Perception     Praxis     Communication Communication Communication: Impaired   Cognition Arousal: Alert Behavior During Therapy: Flat affect Cognition: Difficult to assess             OT - Cognition Comments: alert and able to follow directions with  increased time                 Following commands: Impaired Following commands impaired: Follows one step commands with increased time, Follows multi-step commands inconsistently      Cueing   Cueing Techniques: Verbal cues, Tactile cues, Gestural cues  Exercises      Shoulder Instructions       General Comments      Pertinent Vitals/ Pain       Pain Assessment Pain  Assessment: Faces Faces Pain Scale: Hurts a little bit Pain Location: chronic back pain Pain Descriptors / Indicators: Grimacing, Guarding Pain Intervention(s): Limited activity within patient's tolerance, Monitored during session, Repositioned  Home Living                                          Prior Functioning/Environment              Frequency  Min 1X/week        Progress Toward Goals  OT Goals(current goals can now be found in the care plan section)  Progress towards OT goals: Progressing toward goals  Acute Rehab OT Goals Patient Stated Goal: none stated OT Goal Formulation: With patient Time For Goal Achievement: 06/13/23 Potential to Achieve Goals: Good ADL Goals Pt Will Perform Grooming: with min assist;standing Pt Will Perform Upper Body Bathing: with supervision;sitting Pt Will Perform Lower Body Bathing: with mod assist;sit to/from stand;sitting/lateral leans Pt Will Transfer to Toilet: with min assist;ambulating  Plan      Co-evaluation                 AM-PAC OT "6 Clicks" Daily Activity     Outcome Measure   Help from another person eating meals?: A Little Help from another person taking care of personal grooming?: A Little Help from another person toileting, which includes using toliet, bedpan, or urinal?: A Lot Help from another person bathing (including washing, rinsing, drying)?: A Lot Help from another person to put on and taking off regular upper body clothing?: A Lot Help from another person to put on and taking off regular lower body clothing?: Total 6 Click Score: 13    End of Session Equipment Utilized During Treatment: Gait belt;Rolling walker (2 wheels)  OT Visit Diagnosis: Other abnormalities of gait and mobility (R26.89);Unsteadiness on feet (R26.81);Muscle weakness (generalized) (M62.81);Other symptoms and signs involving cognitive function   Activity Tolerance Patient tolerated treatment well    Patient Left in chair;with call bell/phone within reach;with chair alarm set   Nurse Communication Mobility status        Time: 1610-9604 OT Time Calculation (min): 27 min  Charges: OT General Charges $OT Visit: 1 Visit OT Treatments $Self Care/Home Management : 23-37 mins  Alfonse Flavors, OTA Acute Rehabilitation Services  Office [phone]   Dewain Penning 06/02/2023, 11:44 AM

## 2023-06-02 NOTE — Plan of Care (Signed)

## 2023-06-03 DIAGNOSIS — R569 Unspecified convulsions: Secondary | ICD-10-CM | POA: Diagnosis not present

## 2023-06-03 MED ORDER — CARVEDILOL 3.125 MG PO TABS
3.1250 mg | ORAL_TABLET | Freq: Two times a day (BID) | ORAL | Status: DC
  Administered 2023-06-03 – 2023-06-08 (×10): 3.125 mg via ORAL
  Filled 2023-06-03 (×14): qty 1

## 2023-06-03 NOTE — TOC CM/SW Note (Signed)
 Transition of Care [facility]) - Inpatient Brief Assessment   Patient Details  Name: Danny Patterson MRN: [id number] Date of Birth: [birth year]  [facility] CM/SW Contact:    Mearl Latin, LCSW Phone Number: 06/03/2023, 9:22 AM   Clinical Narrative: TOC following for insurance determination on CIR.    Transition of Care Asessment: Insurance and Status: Insurance coverage has been reviewed Patient has primary care physician: Yes Home environment has been reviewed: Lives with spouse Prior level of function:: Independent Prior/Current Home Services: No current home services Social Drivers of Health Review: SDOH reviewed no interventions necessary Readmission risk has been reviewed: Yes Transition of care needs: transition of care needs identified, TOC will continue to follow

## 2023-06-03 NOTE — Progress Notes (Signed)
 [facility] PROGRESS NOTE   Danny Patterson JYN:[id number] DOB: 02-22-1946 DOA: 05/26/2023  PCP: Smitty Cords, DO  Brief History:  78 y.o. male with medical history significant of HTN, stable BPH, could not tolerate Flomax, gout, lumbar fusion, history of kidney stone presents from home with syncopal episode.  He was found to have left-sided gaze preference and left arm and leg weakness.  Presented as a code stroke.  Apparently also had a shaking episode.  Seen by neurology.  Concern was for seizure activity.  He was hospitalized for further management.   Consultants: Neurology  Procedures:  Continuous EEG shows nonspecific encephalopathy.  No active seizures. No ultrasound.  Nonacute.   Subjective: Patient in bed, appears comfortable, denies any headache, no fever, no chest pain or pressure, no shortness of breath , no abdominal pain. No focal weakness.   Assessment/Plan:   Seizure/acute metabolic encephalopathy  - Thought to have seizure activity.  Did have left-sided symptoms.  Initially thought to be stroke.  CT head and MRI brain stable, initially was on Keppra but due to encephalopathy switched to Depakote with good effect.  Echocardiogram nonacute as well.  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  It is possible that patient has old right parietal injury and due to times of stress i.e. lack of blood supply, hypoperfusion, hypotension, hypoglycemia, fever, infection he has excitable focus giving him focal seizure.  Most of his seizures affect his left arm.  He has been adequately hydrated, placed on midodrine, TED stockings, overall much better.  Neuro on board.   Await CIR bed.   Orthostatic hypotension.  Improved after IV fluids, TED stockings and low-dose midodrine, taper  off midodrine and monitor.  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  He is stable here to initial IV fluids for hydration.  Normocytic anemia  No evidence of overt bleeding.  Continue to monitor.  Essential hypertension - blood pressure is improved have placed him on low-dose Coreg will monitor.  Hyperlipidemia - Continue with statin.  Hypokalemia and hypomagnesemia.  Replaced.    History of gout   Continue with allopurinol.  Colchicine on hold.  Obesity Estimated body mass index is 33.63 kg/m as calculated from the following:   Height as of this encounter: 5\' 10"  (1.778 m).   Weight as of this encounter: 106.3 kg.  DVT Prophylaxis: Subcutaneous heparin Code Status: Full code Family Communication:  daughter  Lyla Son phone [phone] on 05/30/2022, 06/01/2023, 06/02/2023, other daughter updated in the room on 06/01/2023. Disposition Plan: To be determined.  PT OT SLP evaluation  Status is: Inpatient Remains inpatient appropriate because: Acute encephalopathy, concern for seizure activity  Medications: Scheduled:  allopurinol  300 mg Oral Daily   aspirin EC  81 mg Oral Daily   carvedilol  3.125 mg Oral BID WC   clopidogrel  75 mg Oral Daily   divalproex  750 mg Oral Q12H   heparin  5,000 Units Subcutaneous Q8H   multivitamin with minerals  1 tablet Oral Daily   QUEtiapine  25 mg Oral QHS   Continuous:     XBM:[id number] **OR** acetaminophen, albuterol, haloperidol lactate, hydrALAZINE, ondansetron **OR** ondansetron (ZOFRAN) IV,  traMADol   Objective:  Vital Signs  Vitals:   06/03/23 0909 06/03/23 0910 06/03/23 0911 06/03/23 0912  BP:    (!) 154/71  Pulse: 77 76 78 78  Resp: 16 12 20 20   Temp:    98.4 F (36.9 C)  TempSrc:    Oral  SpO2:      Weight:      Height:        Intake/Output Summary (Last 24 hours) at  06/03/2023 0921 Last data filed at 06/03/2023 1610 Gross per 24 hour  Intake 240 ml  Output 700 ml  Net -460 ml   Filed Weights   05/26/23 1608 05/27/23 0455 05/30/23 2346  Weight: 107.5 kg 111.9 kg 106.3 kg    Exam  Awake minimally confused, No new F.N deficits, Normal affect [place].AT,PERRAL Supple Neck, No JVD,   Symmetrical Chest wall movement, Good air movement bilaterally, CTAB RRR,No Gallops, Rubs or new Murmurs,  +ve B.Sounds, Abd Soft, No tenderness,   No Cyanosis, Clubbing or edema   Lab Results:  Data Reviewed: I have personally reviewed following labs and reports of the imaging studies  CBC: Recent Labs  Lab 05/29/23 0847 05/30/23 0525 05/31/23 0430 06/01/23 0443 06/02/23 0519  WBC 7.7 6.9 6.9 7.0 6.4  NEUTROABS 4.9 4.7 4.0 4.2 3.3  HGB 13.5 13.0 13.0 12.1* 11.6*  HCT 38.6* 37.5* 37.6* 35.1* 33.4*  MCV 90.4 92.1 91.9 91.6 91.0  PLT 168 165 169 159 162    Basic Metabolic Panel: Recent Labs  Lab 05/29/23 0847 05/30/23 0525 05/31/23 0430 06/01/23 0443 06/02/23 0519  NA 141 139 138 135 137  K 3.4* 3.8 3.9 3.8 4.3  CL 102 100 101 103 104  CO2 26 27 25 25 24   GLUCOSE 89 90 91 87 81  BUN 8 13 25* 29* 32*  CREATININE 1.08 1.21 1.66* 1.57* 1.25*  CALCIUM 9.7 9.4 9.2 9.0 8.8*  MG 1.6* 2.3 2.2 1.9 1.9  PHOS 2.5 3.6 5.3* 3.6 3.0    GFR: Estimated Creatinine Clearance: 59.5 mL/min (A) (by C-G formula based on SCr of 1.25 mg/dL (H)).  Liver Function Tests: Recent Labs  Lab 05/29/23 0847 05/30/23 0525 05/31/23 0430 06/01/23 0443 06/02/23 0519  AST 52* 46* 35 31 29  ALT 22 22 20 19 19   ALKPHOS 60 62 59 55 52  BILITOT 0.9 0.9 0.6 0.7 0.6  PROT 6.0* 5.9* 5.7* 5.6* 5.1*  ALBUMIN 3.6 3.2* 3.0* 3.0* 2.7*     Coagulation Profile: No results for input(s): "INR", "PROTIME" in the last 168 hours.   CBG: Recent Labs  Lab 05/31/23 1315 05/31/23 1949 06/01/23 0756 06/01/23 2119 06/02/23 0803  GLUCAP 88 130* 86 123* 85     Radiology  Studies: No results found.    LOS: 7 days   Signature  -    Susa Raring M.D on 06/03/2023 at 9:21 AM   -  To page go to [email or web address]

## 2023-06-03 NOTE — Progress Notes (Signed)
 Physical Therapy Treatment Patient Details Name: Danny Patterson MRN: [id number] DOB: [birth year] Today's Date: 06/03/2023   History of Present Illness Pt is a 78 y.o. M who presents with left sided gaze preference and weakness. Presented as code stroke. CT head negative for acute abnormality. Now being worked up for seizure. 3/11 episode of decr responsiveness; head CT negative. Some tremulous activity with ?seizure but also hypotensive. EEG pending Significant PMH: HTN, stable BPH, gout, lumbar fusion, history of kidney stone.    PT Comments  Pt is progressing towards goals. Pt was able to perform bed mobility at Min A, sit to stand at Mod-Min A this session with RW. Pt currently is a total A for transfer from EOB to recliner due to fear of falling with resistance for pivot from EOB to recliner pt stating "I have bounced one to many times to feel safe moving like this."  Pt has good family support. Due to pt current functional status, home set up and available assistance at home recommending skilled physical therapy services > 3 hours/day in order to address strength, balance and functional mobility to decrease risk for falls, injury, immobility, skin break down and re-hospitalization.      If plan is discharge home, recommend the following: A lot of help with walking and/or transfers;Assistance with cooking/housework;Assist for transportation;Help with stairs or ramp for entrance;Supervision due to cognitive status     Equipment Recommendations  BSC/3in1;Wheelchair (measurements PT);Wheelchair cushion (measurements PT)       Precautions / Restrictions Precautions Precautions: Fall;Other (comment) Recall of Precautions/Restrictions: Impaired Restrictions Weight Bearing Restrictions Per Provider Order: No     Mobility  Bed Mobility Overal bed mobility: Needs Assistance Bed Mobility: Supine to Sit     Supine to sit: HOB elevated, Used rails, Min assist     General bed mobility comments:  Min A for assist scooting to EOB. Significant increase in time to perform activity. Pt states that he sleeps in recliner at home.    Transfers Overall transfer level: Needs assistance Equipment used: Rolling walker (2 wheels) Transfers: Sit to/from Stand, Bed to chair/wheelchair/BSC Sit to Stand: Mod assist, Min assist Stand pivot transfers: Total assist         General transfer comment: Stand pivot at Total A due to pt actively resists movement pushing against sink to avoid pivoting toward chair despite multi modal cues in multiple different ways and reseting activity 2x in order to get pt to understand movement. For safety pt was pivoted at Total A to chair. Pt was Min to Mod A for sit to stand with RW at Mod A initially with increased time to get to standing and Min A on second attempt.    Ambulation/Gait     Pre-gait activities: Pt performed marching in place with low but complete floor clearance and stepping fwd/back 2 steps.       Balance Overall balance assessment: Needs assistance Sitting-balance support: Feet supported Sitting balance-Leahy Scale: Fair Sitting balance - Comments: supervison   Standing balance support: Bilateral upper extremity supported, Reliant on assistive device for balance, During functional activity Standing balance-Leahy Scale: Poor Standing balance comment: reliant on external support. High fear of falling      Communication Communication Communication: Impaired Factors Affecting Communication: Difficulty expressing self  Cognition Arousal: Alert Behavior During Therapy: Flat affect   PT - Cognitive impairments: Awareness, Attention, Memory, Initiation, Sequencing, Problem solving, Safety/Judgement   Orientation impairments: Situation       PT - Cognition Comments: Pt difficulty  following commands; actively resisting movement to get into chair; while stating he is assisting. Following commands: Impaired Following commands impaired:  Follows one step commands with increased time, Follows multi-step commands inconsistently    Cueing Cueing Techniques: Verbal cues, Tactile cues, Gestural cues     General Comments General comments (skin integrity, edema, etc.): Pt reports dizziness upon first sitting that remained constant througout without an increase with standing/activity. Daughter and sister present during session      Pertinent Vitals/Pain Pain Assessment Pain Assessment: Faces Faces Pain Scale: Hurts a little bit Breathing: normal Negative Vocalization: none Facial Expression: smiling or inexpressive Body Language: relaxed Consolability: no need to console PAINAD Score: 0 Pain Location: chronic back pain Pain Descriptors / Indicators: Grimacing, Guarding Pain Intervention(s): Monitored during session, Limited activity within patient's tolerance     PT Goals (current goals can now be found in the care plan section) Acute Rehab PT Goals Patient Stated Goal: pt daughter would like for him to walk PT Goal Formulation: With patient/family Time For Goal Achievement: 06/11/23 Potential to Achieve Goals: Good Progress towards PT goals: Progressing toward goals    Frequency    Min 3X/week      PT Plan  Continue with current POC        AM-PAC PT "6 Clicks" Mobility   Outcome Measure  Help needed turning from your back to your side while in a flat bed without using bedrails?: A Little Help needed moving from lying on your back to sitting on the side of a flat bed without using bedrails?: A Little Help needed moving to and from a bed to a chair (including a wheelchair)?: Total Help needed standing up from a chair using your arms (e.g., wheelchair or bedside chair)?: Total Help needed to walk in hospital room?: Total Help needed climbing 3-5 steps with a railing? : Total 6 Click Score: 10    End of Session Equipment Utilized During Treatment: Gait belt Activity Tolerance: Patient limited by  fatigue Patient left: in chair;with call bell/phone within reach;with family/visitor present;Other (comment) (RN aware family in room; pt does not have alarm on) Nurse Communication: Mobility status PT Visit Diagnosis: Unsteadiness on feet (R26.81);History of falling (Z91.81);Difficulty in walking, not elsewhere classified (R26.2)     Time: 1610-9604 PT Time Calculation (min) (ACUTE ONLY): 39 min  Charges:    $Therapeutic Activity: 38-52 mins PT General Charges $$ ACUTE PT VISIT: 1 Visit                    Harrel Carina, DPT, CLT  Acute Rehabilitation Services Office: [phone] (Secure chat preferred)    Claudia Desanctis 06/03/2023, 5:25 PM

## 2023-06-03 NOTE — Progress Notes (Signed)
 Inpatient Rehab Admissions Coordinator:     CIR following. I continue to await a decision from Pt.'s insurance. I will update when I have a decision.   Megan Salon, MS, CCC-SLP Rehab Admissions Coordinator  [phone] (celll) [phone] (office)

## 2023-06-03 NOTE — Plan of Care (Signed)

## 2023-06-04 DIAGNOSIS — G40901 Epilepsy, unspecified, not intractable, with status epilepticus: Secondary | ICD-10-CM | POA: Diagnosis not present

## 2023-06-04 DIAGNOSIS — I69398 Other sequelae of cerebral infarction: Secondary | ICD-10-CM | POA: Diagnosis not present

## 2023-06-04 DIAGNOSIS — R569 Unspecified convulsions: Secondary | ICD-10-CM | POA: Diagnosis not present

## 2023-06-04 LAB — HEPATIC FUNCTION PANEL
ALT: 20 U/L (ref 0–44)
AST: 29 U/L (ref 15–41)
Albumin: 2.6 g/dL — ABNORMAL LOW (ref 3.5–5.0)
Alkaline Phosphatase: 61 U/L (ref 38–126)
Bilirubin, Direct: <0.1 mg/dL (ref 0.0–0.2)
Total Bilirubin: 0.7 mg/dL (ref 0.0–1.2)
Total Protein: 5.4 g/dL — ABNORMAL LOW (ref 6.5–8.1)

## 2023-06-04 LAB — GLUCOSE, CAPILLARY: Glucose-Capillary: 103 mg/dL — ABNORMAL HIGH (ref 70–99)

## 2023-06-04 LAB — AMMONIA: Ammonia: 28 umol/L (ref 9–35)

## 2023-06-04 LAB — VALPROIC ACID LEVEL: Valproic Acid Lvl: 87 ug/mL (ref 50.0–100.0)

## 2023-06-04 MED ORDER — QUETIAPINE FUMARATE 50 MG PO TABS
50.0000 mg | ORAL_TABLET | Freq: Every day | ORAL | Status: DC
  Administered 2023-06-04 – 2023-06-08 (×5): 50 mg via ORAL
  Filled 2023-06-04 (×5): qty 1

## 2023-06-04 MED ORDER — LACTULOSE 10 GM/15ML PO SOLN: 30.0000 g | Freq: Two times a day (BID) | ORAL | Status: DC

## 2023-06-04 NOTE — Plan of Care (Signed)
  Problem: Clinical Measurements: Goal: Ability to maintain clinical measurements within normal limits will improve Outcome: Progressing Goal: Will remain free from infection Outcome: Progressing   Problem: Activity: Goal: Risk for activity intolerance will decrease Outcome: Progressing   Problem: Safety: Goal: Ability to remain free from injury will improve Outcome: Progressing   

## 2023-06-04 NOTE — Care Management (Signed)
 Spoke w patient and daughter at bedside, with other daughter Lyla Son on the phone.  Discussed levels of support and they would like to look into SNF. Discussed locations and top preference is [facility] in [place]. Updated [facility] for SNF workup. MD aware of DC planning goals via secure chat.

## 2023-06-04 NOTE — Progress Notes (Signed)
 [facility] PROGRESS NOTE   Danny Patterson UJW:[id number] DOB: 08-01-1945 DOA: 05/26/2023  PCP: Smitty Cords, DO  Brief History:  78 y.o. male with medical history significant of HTN, stable BPH, could not tolerate Flomax, gout, lumbar fusion, history of kidney stone presents from home with syncopal episode.  He was found to have left-sided gaze preference and left arm and leg weakness.  Presented as a code stroke.  Apparently also had a shaking episode.  Seen by neurology.  Concern was for seizure activity.  He was hospitalized for further management.   Consultants: Neurology  Procedures:  Continuous EEG shows nonspecific encephalopathy.  No active seizures. No ultrasound.  Nonacute.   Subjective: Patient in bed, appears comfortable, denies any headache, no fever, no chest pain or pressure, no shortness of breath , no abdominal pain. No focal weakness.  Assessment/Plan:   Seizure/acute metabolic encephalopathy  - Thought to have seizure activity.  Did have left-sided symptoms.  Initially thought to be stroke.  CT head and MRI brain stable, initially was on Keppra but due to encephalopathy switched to Depakote with good effect.  Echocardiogram nonacute as well.  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  It is possible that patient has old right parietal injury and due to times of stress i.e. lack of blood supply, hypoperfusion, hypotension, hypoglycemia, fever, infection he has excitable focus giving him focal seizure.  Most of his seizures affect his left arm.  He has been adequately hydrated, placed on midodrine, TED stockings, overall much better.  Neuro on board.   Await CIR bed.   Orthostatic hypotension.  Improved after IV fluids, TED stockings and low-dose midodrine, taper off  midodrine and monitor.  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  He is stable here to initial IV fluids for hydration.  Normocytic anemia  No evidence of overt bleeding.  Continue to monitor.  Essential hypertension - blood pressure is improved have placed him on low-dose Coreg will monitor.  Hyperlipidemia - Continue with statin.  Hypokalemia and hypomagnesemia.  Replaced.    History of gout   Continue with allopurinol.  Colchicine on hold.  Obesity Estimated body mass index is 33.63 kg/m as calculated from the following:   Height as of this encounter: 5\' 10"  (1.778 m).   Weight as of this encounter: 106.3 kg.  DVT Prophylaxis: Subcutaneous heparin Code Status: Full code Family Communication:  daughter  Lyla Son phone [phone] on 05/30/2022, 06/01/2023, 06/02/2023, other daughter updated in the room on 06/01/2023.  Left message on 06/04/2023 at 10:05 AM. Disposition Plan: To be determined.  PT OT SLP evaluation  Status is: Inpatient Remains inpatient appropriate because: Acute encephalopathy, concern for seizure activity  Medications: Scheduled:  allopurinol  300 mg Oral Daily   aspirin EC  81 mg Oral Daily   carvedilol  3.125 mg Oral BID WC   clopidogrel  75 mg Oral Daily   divalproex  750 mg Oral Q12H   heparin  5,000 Units Subcutaneous Q8H   multivitamin with minerals  1 tablet Oral Daily   QUEtiapine  25 mg Oral QHS   Continuous:  QIO:[id number] **OR** acetaminophen, albuterol, haloperidol lactate, hydrALAZINE, ondansetron **  OR** ondansetron (ZOFRAN) IV, traMADol   Objective:  Vital Signs  Vitals:   06/03/23 2100 06/03/23 2357 06/04/23 0336 06/04/23 0746  BP:  (!) 153/71 121/63 (!) 155/79  Pulse: 79 70 64 67  Resp: 17 20 16 18   Temp:  98.1 F (36.7 C) 98 F (36.7 C) 98.1 F (36.7 C)  TempSrc:  Oral Oral Oral   SpO2: 96% 96% 94% 92%  Weight:      Height:       No intake or output data in the 24 hours ending 06/04/23 1004  Filed Weights   05/26/23 1608 05/27/23 0455 05/30/23 2346  Weight: 107.5 kg 111.9 kg 106.3 kg    Exam  Awake minimally confused, No new F.N deficits, Normal affect [place].AT,PERRAL Supple Neck, No JVD,   Symmetrical Chest wall movement, Good air movement bilaterally, CTAB RRR,No Gallops, Rubs or new Murmurs,  +ve B.Sounds, Abd Soft, No tenderness,   No Cyanosis, Clubbing or edema   Lab Results:  Data Reviewed: I have personally reviewed following labs and reports of the imaging studies  CBC: Recent Labs  Lab 05/29/23 0847 05/30/23 0525 05/31/23 0430 06/01/23 0443 06/02/23 0519  WBC 7.7 6.9 6.9 7.0 6.4  NEUTROABS 4.9 4.7 4.0 4.2 3.3  HGB 13.5 13.0 13.0 12.1* 11.6*  HCT 38.6* 37.5* 37.6* 35.1* 33.4*  MCV 90.4 92.1 91.9 91.6 91.0  PLT 168 165 169 159 162    Basic Metabolic Panel: Recent Labs  Lab 05/29/23 0847 05/30/23 0525 05/31/23 0430 06/01/23 0443 06/02/23 0519  NA 141 139 138 135 137  K 3.4* 3.8 3.9 3.8 4.3  CL 102 100 101 103 104  CO2 26 27 25 25 24   GLUCOSE 89 90 91 87 81  BUN 8 13 25* 29* 32*  CREATININE 1.08 1.21 1.66* 1.57* 1.25*  CALCIUM 9.7 9.4 9.2 9.0 8.8*  MG 1.6* 2.3 2.2 1.9 1.9  PHOS 2.5 3.6 5.3* 3.6 3.0    GFR: Estimated Creatinine Clearance: 59.5 mL/min (A) (by C-G formula based on SCr of 1.25 mg/dL (H)).  Liver Function Tests: Recent Labs  Lab 05/29/23 0847 05/30/23 0525 05/31/23 0430 06/01/23 0443 06/02/23 0519  AST 52* 46* 35 31 29  ALT 22 22 20 19 19   ALKPHOS 60 62 59 55 52  BILITOT 0.9 0.9 0.6 0.7 0.6  PROT 6.0* 5.9* 5.7* 5.6* 5.1*  ALBUMIN 3.6 3.2* 3.0* 3.0* 2.7*     Coagulation Profile: No results for input(s): "INR", "PROTIME" in the last 168 hours.   CBG: Recent Labs  Lab 05/31/23 1315 05/31/23 1949 06/01/23 0756 06/01/23 2119 06/02/23 0803  GLUCAP 88 130* 86 123* 85     Radiology  Studies: No results found.    LOS: 8 days   Signature  -    Susa Raring M.D on 06/04/2023 at 10:04 AM   -  To page go to [email or web address]

## 2023-06-04 NOTE — Progress Notes (Incomplete)
 NEUROLOGY CONSULT FOLLOW UP NOTE   Date of service: June 04, 2023 [name] MRN:  [id number] DOB:  Oct 04, 1945  Interval Hx/subjective   *** Vitals   Vitals:   06/03/23 2100 06/03/23 2357 06/04/23 0336 06/04/23 0746  BP:  (!) 153/71 121/63 (!) 155/79  Pulse: 79 70 64 67  Resp: 17 20 16 18   Temp:  98.1 F (36.7 C) 98 F (36.7 C) 98.1 F (36.7 C)  TempSrc:  Oral Oral Oral  SpO2: 96% 96% 94% 92%  Weight:      Height:         Body mass index is 33.63 kg/m.  Physical Exam   Constitutional: Appears well-developed and well-nourished. *** Psych: Affect appropriate to situation. *** Eyes: No scleral injection. *** HENT: No OP obstrucion. *** Head: Normocephalic. *** Cardiovascular: Normal rate and regular rhythm. *** Respiratory: Effort normal, non-labored breathing. *** GI: Soft.  No distension. There is no tenderness. *** Skin: WDI. ***  Neurologic Examination   ***  Medications  Current Facility-Administered Medications:    acetaminophen (TYLENOL) tablet 650 mg, 650 mg, Oral, Q6H PRN, 650 mg at 05/31/23 0849 **OR** acetaminophen (TYLENOL) suppository 650 mg, 650 mg, Rectal, Q6H PRN, Regalado, Belkys A, MD   albuterol (PROVENTIL) (2.5 MG/3ML) 0.083% nebulizer solution 2.5 mg, 2.5 mg, Nebulization, Q2H PRN, Regalado, Belkys A, MD   allopurinol (ZYLOPRIM) tablet 300 mg, 300 mg, Oral, Daily, Regalado, Belkys A, MD, 300 mg at 06/04/23 1109   aspirin EC tablet 81 mg, 81 mg, Oral, Daily, Rejeana Brock, MD, 81 mg at 06/04/23 1109   carvedilol (COREG) tablet 3.125 mg, 3.125 mg, Oral, BID WC, Leroy Sea, MD, 3.125 mg at 06/04/23 1110   clopidogrel (PLAVIX) tablet 75 mg, 75 mg, Oral, Daily, Charlsie Quest, MD, 75 mg at 06/04/23 1109   divalproex (DEPAKOTE) DR tablet 750 mg, 750 mg, Oral, Q12H, Rejeana Brock, MD, 750 mg at 06/04/23 1109   haloperidol lactate (HALDOL) injection 2 mg, 2 mg, Intramuscular, Q6H PRN, Leroy Sea, MD    heparin injection 5,000 Units, 5,000 Units, Subcutaneous, Q8H, Regalado, Belkys A, MD, 5,000 Units at 06/04/23 0452   hydrALAZINE (APRESOLINE) injection 5 mg, 5 mg, Intravenous, Q6H PRN, Regalado, Belkys A, MD, 5 mg at 05/28/23 1954   multivitamin with minerals tablet 1 tablet, 1 tablet, Oral, Daily, Regalado, Belkys A, MD, 1 tablet at 06/04/23 1110   ondansetron (ZOFRAN) tablet 4 mg, 4 mg, Oral, Q6H PRN **OR** ondansetron (ZOFRAN) injection 4 mg, 4 mg, Intravenous, Q6H PRN, Regalado, Belkys A, MD   QUEtiapine (SEROQUEL) tablet 50 mg, 50 mg, Oral, QHS, Leroy Sea, MD  Labs and Diagnostic Imaging   CBC:  Recent Labs  Lab 06/01/23 0443 06/02/23 0519  WBC 7.0 6.4  NEUTROABS 4.2 3.3  HGB 12.1* 11.6*  HCT 35.1* 33.4*  MCV 91.6 91.0  PLT 159 162    Basic Metabolic Panel:  Lab Results  Component Value Date   NA 137 06/02/2023   K 4.3 06/02/2023   CO2 24 06/02/2023   GLUCOSE 81 06/02/2023   BUN 32 (H) 06/02/2023   CREATININE 1.25 (H) 06/02/2023   CALCIUM 8.8 (L) 06/02/2023   GFRNONAA 59 (L) 06/02/2023   Lipid Panel:  Lab Results  Component Value Date   LDLCALC 63 05/29/2023   HgbA1c: No results found for: "HGBA1C" Urine Drug Screen: No results found for: "LABOPIA", "COCAINSCRNUR", "LABBENZ", "AMPHETMU", "THCU", "LABBARB"  Alcohol Level     Component Value  Date/Time   ETH <10 05/26/2023 1511   INR  Lab Results  Component Value Date   INR 1.1 05/26/2023   APTT  Lab Results  Component Value Date   APTT 27 05/26/2023   AED levels: No results found for: "PHENYTOIN", "ZONISAMIDE", "LAMOTRIGINE", "LEVETIRACETA"  CT Head without contrast(Personally reviewed): Stable non contrast CT appearance of the brain. No acute cortically based infarct or acute intracranial hemorrhage identified. ASPECTS 10.  CT angio Head and Neck with contrast(Personally reviewed): No large vessel occlusion.  Moderate stenosis of the Right ICA siphon due to calcified plaque  MRI  Brain(Personally reviewed): No acute intracranial abnormality.  Numerous chronic microhemorrhages in a predominantly peripheral distribution, most consistent with cerebral amyloid angiopathy.  rEEG:  ***  Assessment   78 y.o. male who presents with new onset focal status epilepticus.  He continues to be quite delirious, and does admit to drinking about six beers a day. Keppra switched to Depakote due to encephalopathy.   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 Note/plan to be edited by MD as needed.    Lynnae January, DNP, AGACNP-BC [facility] Please use AMION for contact information & EPIC for messaging.

## 2023-06-04 NOTE — Progress Notes (Signed)
 Inpatient Rehab Admissions Coordinator:    I received a denial from Pt.'s insurance for CIR. I notified family. They are not pursuing appeal. Would like to discuss SNF vs HH. I will sign off and notify TOC.   Megan Salon, MS, CCC-SLP Rehab Admissions Coordinator  [phone] (celll) [phone] (office)

## 2023-06-04 NOTE — TOC Progression Note (Signed)
 Transition of Care [facility]) - Progression Note    Patient Details  Name: Danny Patterson MRN: [id number] Date of Birth: [birth year]  [facility]) CM/SW Contact  Danny Patterson, Danny Patterson, Danny Patterson Phone Number: 06/04/2023, 2:49 PM  Clinical Narrative:     CSW informed that patient's case was denied by insurance. Family is not pursuing appeal. Family would like to pursue SNF placement at this time. SNF work up completed, Fl2 faxed out to facilities in [place] and [place] as requested. Family prefers [facility]	[place] but is willing to consider other facilities if [facility]	[place] is not available.   TOC to continue to follow up with family to review bed offers.  Fenton Candee, LCSW [place]  [facility], [facility] Licensed Clinical Social Worker Care Coordinator  Direct Dial: [phone]          Expected Discharge Plan and Services       Living arrangements for the past 2 months: Single Family Home                                       Social Determinants of Health (SDOH) Interventions SDOH Screenings   Food Insecurity: Patient Unable To Answer (05/27/2023)  Housing: Low Risk  (05/31/2023)  Transportation Needs: Patient Unable To Answer (05/27/2023)  Utilities: Patient Unable To Answer (05/27/2023)    Readmission Risk Interventions     No data to display

## 2023-06-04 NOTE — Progress Notes (Signed)
 Interval history: No acute events overnight, neurology exam stable.  Examination  Vital signs in last 24 hours: Temp:  [97.9 F (36.6 C)-98.1 F (36.7 C)] 98.1 F (36.7 C) (03/15 0746) Pulse Rate:  [64-79] 67 (03/15 0746) Resp:  [16-20] 18 (03/15 0746) BP: (121-155)/(58-79) 155/79 (03/15 0746) SpO2:  [92 %-97 %] 92 % (03/15 0746)  General: lying in bed, NAD  Neuro: awake and alert. Confused.  No dysarthria, aphasia or neglect.  EOMI, smile symmetric, spontaneously moves all extremities with no drifts present, sensation intact and symmetric.    Basic Metabolic Panel: Recent Labs  Lab 05/29/23 0847 05/30/23 0525 05/31/23 0430 06/01/23 0443 06/02/23 0519  NA 141 139 138 135 137  K 3.4* 3.8 3.9 3.8 4.3  CL 102 100 101 103 104  CO2 26 27 25 25 24   GLUCOSE 89 90 91 87 81  BUN 8 13 25* 29* 32*  CREATININE 1.08 1.21 1.66* 1.57* 1.25*  CALCIUM 9.7 9.4 9.2 9.0 8.8*  MG 1.6* 2.3 2.2 1.9 1.9  PHOS 2.5 3.6 5.3* 3.6 3.0    CBC: Recent Labs  Lab 05/29/23 0847 05/30/23 0525 05/31/23 0430 06/01/23 0443 06/02/23 0519  WBC 7.7 6.9 6.9 7.0 6.4  NEUTROABS 4.9 4.7 4.0 4.2 3.3  HGB 13.5 13.0 13.0 12.1* 11.6*  HCT 38.6* 37.5* 37.6* 35.1* 33.4*  MCV 90.4 92.1 91.9 91.6 91.0  PLT 168 165 169 159 162     Coagulation Studies: No results for input(s): "LABPROT", "INR" in the last 72 hours.  ASSESSMENT AND PLAN: 78 y.o. male who presents with new onset focal status epilepticus., improved with multiple AEDs. 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 EEG showed no seizures, neuro exam has been stable.   His ammonia was mildly elevated two days ago at 36 but we rechecked it today and it was normal, as was his LFT panel. His VPA level is currently therapeutic at 87.  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    Recommendations - Continue Depakote 750 twice daily (Ammonia and LFTs WNL today) - Discontinue Tramadol as it lowers seizure threshold - Continue BP control, SBP <160, for likely CAA - Continue ASA 81mg  for secondary stroke prevention.  - Continue Multivitamin  - Neurology will continue to follow   Pt seen by Neuro NP/APP and later by MD. Note/plan to be edited by MD as needed.    Lynnae January, DNP, AGACNP-BC [facility] Please use AMION for contact information & EPIC for messaging.   Attending Neurohospitalist Addendum Patient seen and examined with APP/Resident. Agree with the history and physical as documented above. Agree with the plan as documented, which I helped formulate. I have edited the note above to reflect my full findings and recommendations. I have independently reviewed the chart, obtained history, review of systems and examined the patient.I have personally reviewed pertinent head/neck/spine imaging (CT/MRI). Please feel free to call with any questions.  -- Bing Neighbors, MD [facility] [phone]  If 7pm- 7am, please page neurology on call as listed in AMION.

## 2023-06-05 ENCOUNTER — Encounter (HOSPITAL_COMMUNITY): Payer: Self-pay | Admitting: Internal Medicine

## 2023-06-05 DIAGNOSIS — I69398 Other sequelae of cerebral infarction: Secondary | ICD-10-CM | POA: Diagnosis not present

## 2023-06-05 DIAGNOSIS — G40901 Epilepsy, unspecified, not intractable, with status epilepticus: Secondary | ICD-10-CM | POA: Diagnosis not present

## 2023-06-05 DIAGNOSIS — R569 Unspecified convulsions: Secondary | ICD-10-CM | POA: Diagnosis not present

## 2023-06-05 LAB — BASIC METABOLIC PANEL
Anion gap: 8 (ref 5–15)
BUN: 38 mg/dL — ABNORMAL HIGH (ref 8–23)
CO2: 26 mmol/L (ref 22–32)
Calcium: 8.9 mg/dL (ref 8.9–10.3)
Chloride: 106 mmol/L (ref 98–111)
Creatinine, Ser: 1.32 mg/dL — ABNORMAL HIGH (ref 0.61–1.24)
GFR, Estimated: 55 mL/min — ABNORMAL LOW (ref >60)
Glucose, Bld: 95 mg/dL (ref 70–99)
Potassium: 3.9 mmol/L (ref 3.5–5.1)
Sodium: 140 mmol/L (ref 135–145)

## 2023-06-05 LAB — GLUCOSE, CAPILLARY
Glucose-Capillary: 84 mg/dL (ref 70–99)
Glucose-Capillary: 76 mg/dL (ref 70–99)
Glucose-Capillary: 89 mg/dL (ref 70–99)

## 2023-06-05 LAB — MAGNESIUM: Magnesium: 1.9 mg/dL (ref 1.7–2.4)

## 2023-06-05 LAB — VITAMIN B12: Vitamin B-12: 937 pg/mL — ABNORMAL HIGH (ref 180–914)

## 2023-06-05 NOTE — Plan of Care (Signed)
   Problem: Education: Goal: Knowledge of General Education information will improve Description Including pain rating scale, medication(s)/side effects and non-pharmacologic comfort measures Outcome: Progressing

## 2023-06-05 NOTE — TOC Progression Note (Signed)
 Transition of Care [facility]) - Progression Note    Patient Details  Name: Danny Patterson MRN: [id number] Date of Birth: [birth year]  Transition [facility] Care [facility]) CM/SW Contact  Tayjon Halladay [place], [place] Phone Number: 06/05/2023, 2:28 PM  Clinical Narrative:      Current bed offers provided to patient's daughter. Confirmed only one offer in [facility]. Patient's daughter will review.  Abbygale Lapid, LCSW Transition of Care        Expected Discharge Plan and Services       Living arrangements for the past 2 months: Single Family Home                                       Social Determinants of Health (SDOH) Interventions SDOH Screenings   Food Insecurity: Patient Unable To Answer (05/27/2023)  Housing: Low Risk  (05/31/2023)  Transportation Needs: Patient Unable To Answer (05/27/2023)  Utilities: Patient Unable To Answer (05/27/2023)    Readmission Risk Interventions     No data to display

## 2023-06-05 NOTE — Plan of Care (Signed)

## 2023-06-05 NOTE — Progress Notes (Signed)
 [facility] PROGRESS NOTE   Paarth Cropper NWG:[id number] DOB: Dec 01, 1945 DOA: 05/26/2023  PCP: Smitty Cords, DO  Brief History:  78 y.o. male with medical history significant of HTN, stable BPH, could not tolerate Flomax, gout, lumbar fusion, history of kidney stone presents from home with syncopal episode.  He was found to have left-sided gaze preference and left arm and leg weakness.  Presented as a code stroke.  Apparently also had a shaking episode.  Seen by neurology.  Concern was for seizure activity.  He was hospitalized for further management.   Consultants: Neurology  Procedures:  Continuous EEG shows nonspecific encephalopathy.  No active seizures. No ultrasound.  Nonacute.   Subjective: Patient in bed, appears comfortable, denies any headache, no fever, no chest pain or pressure, no shortness of breath , no abdominal pain. No new focal weakness.  Assessment/Plan:   Seizure/acute metabolic encephalopathy  - Thought to have seizure activity.  Did have left-sided symptoms.  Initially thought to be stroke.  CT head and MRI brain stable, initially was on Keppra but due to encephalopathy switched to Depakote with good effect.  Echocardiogram nonacute as well.  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  It is possible that patient has old right parietal injury and due to times of stress i.e. lack of blood supply, hypoperfusion, hypotension, hypoglycemia, fever, infection he has excitable focus giving him focal seizure.  Most of his seizures affect his left arm.  He has been adequately hydrated, placed on midodrine, TED stockings, overall much better.  Neuro on board.   Await CIR bed.   Orthostatic hypotension.  Resolved after IV fluids, TED stockings and low-dose midodrine,  tapered off midodrine and monitor.  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  He is stable here to initial IV fluids for hydration.  Normocytic anemia  No evidence of overt bleeding.  Continue to monitor.  Essential hypertension - blood pressure is improved have placed him on low-dose Coreg will monitor.  Hyperlipidemia - Continue with statin.  Hypokalemia and hypomagnesemia.  Replaced.    History of gout   Continue with allopurinol.  Colchicine on hold.  Obesity Estimated body mass index is 33.63 kg/m as calculated from the following:   Height as of this encounter: 5\' 10"  (1.778 m).   Weight as of this encounter: 106.3 kg.  DVT Prophylaxis: Subcutaneous heparin Code Status: Full code Family Communication:  daughter  Lyla Son phone [phone] on 05/30/2022, 06/01/2023, 06/02/2023, other daughter updated in the room on 06/01/2023.  Left message on 06/04/2023 at 10:05 AM. Disposition Plan: To be determined.  PT OT SLP evaluation  Status is: Inpatient Remains inpatient appropriate because: Acute encephalopathy, concern for seizure activity  Medications: Scheduled:  allopurinol  300 mg Oral Daily   aspirin EC  81 mg Oral Daily   carvedilol  3.125 mg Oral BID WC   clopidogrel  75 mg Oral Daily   divalproex  750 mg Oral Q12H   heparin  5,000 Units Subcutaneous Q8H   multivitamin with minerals  1 tablet Oral Daily   QUEtiapine  50 mg Oral QHS   Continuous:  MWU:[id number] **OR** acetaminophen, albuterol, haloperidol lactate, hydrALAZINE,  ondansetron **OR** ondansetron (ZOFRAN) IV   Objective:  Vital Signs  Vitals:   06/04/23 1942 06/04/23 2306 06/05/23 0359 06/05/23 0807  BP: (!) 157/72 (!) 149/59 115/69 (!) 149/59  Pulse: 68 68 (!) 56 71  Resp: 16 17 18  (!) 21  Temp: 98.6 F (37 C) 98.3 F (36.8 C) (!) 97.5 F (36.4 C) 98.1 F  (36.7 C)  TempSrc: Oral Oral Oral Oral  SpO2: 95% 95% 97% 96%  Weight:      Height:        Intake/Output Summary (Last 24 hours) at 06/05/2023 0946 Last data filed at 06/04/2023 1900 Gross per 24 hour  Intake --  Output 750 ml  Net -750 ml    Filed Weights   05/26/23 1608 05/27/23 0455 05/30/23 2346  Weight: 107.5 kg 111.9 kg 106.3 kg    Exam  Awake minimally confused, No new F.N deficits, Normal affect [place].AT,PERRAL Supple Neck, No JVD,   Symmetrical Chest wall movement, Good air movement bilaterally, CTAB RRR,No Gallops, Rubs or new Murmurs,  +ve B.Sounds, Abd Soft, No tenderness,   No Cyanosis, Clubbing or edema   Lab Results:  Data Reviewed: I have personally reviewed following labs and reports of the imaging studies  CBC: Recent Labs  Lab 05/30/23 0525 05/31/23 0430 06/01/23 0443 06/02/23 0519  WBC 6.9 6.9 7.0 6.4  NEUTROABS 4.7 4.0 4.2 3.3  HGB 13.0 13.0 12.1* 11.6*  HCT 37.5* 37.6* 35.1* 33.4*  MCV 92.1 91.9 91.6 91.0  PLT 165 169 159 162    Basic Metabolic Panel: Recent Labs  Lab 05/30/23 0525 05/31/23 0430 06/01/23 0443 06/02/23 0519 06/05/23 0556  NA 139 138 135 137 140  K 3.8 3.9 3.8 4.3 3.9  CL 100 101 103 104 106  CO2 27 25 25 24 26   GLUCOSE 90 91 87 81 95  BUN 13 25* 29* 32* 38*  CREATININE 1.21 1.66* 1.57* 1.25* 1.32*  CALCIUM 9.4 9.2 9.0 8.8* 8.9  MG 2.3 2.2 1.9 1.9 1.9  PHOS 3.6 5.3* 3.6 3.0  --     GFR: Estimated Creatinine Clearance: 56.3 mL/min (A) (by C-G formula based on SCr of 1.32 mg/dL (H)).  Liver Function Tests: Recent Labs  Lab 05/30/23 0525 05/31/23 0430 06/01/23 0443 06/02/23 0519 06/04/23 1512  AST 46* 35 31 29 29   ALT 22 20 19 19 20   ALKPHOS 62 59 55 52 61  BILITOT 0.9 0.6 0.7 0.6 0.7  PROT 5.9* 5.7* 5.6* 5.1* 5.4*  ALBUMIN 3.2* 3.0* 3.0* 2.7* 2.6*     Coagulation Profile: No results for input(s): "INR", "PROTIME" in the last 168 hours.   CBG: Recent Labs  Lab 06/01/23 0756 06/01/23 2119  06/02/23 0803 06/04/23 1947 06/05/23 0735  GLUCAP 86 123* 85 103* 84     Radiology Studies: No results found.    LOS: 9 days   Signature  -    Susa Raring M.D on 06/05/2023 at 9:46 AM   -  To page go to [email or web address]

## 2023-06-05 NOTE — Progress Notes (Signed)
 Interval history:   Examination  Vital signs in last 24 hours: Temp:  [97.5 F (36.4 C)-98.6 F (37 C)] 98.1 F (36.7 C) (03/16 0807) Pulse Rate:  [56-71] 71 (03/16 0807) Resp:  [16-21] 21 (03/16 0807) BP: (115-157)/(59-72) 149/59 (03/16 0807) SpO2:  [95 %-97 %] 96 % (03/16 0807)  General:sitting up in bed, NAD  Neuro: awake and alert. Oriented to self, place, month, year, birthday. Forgetful, but redirectable.  No dysarthria, aphasia or neglect.  EOMI, smile symmetric, spontaneously moves all extremities with no drifts present, sensation intact and symmetric.    Basic Metabolic Panel: Recent Labs  Lab 05/30/23 0525 05/31/23 0430 06/01/23 0443 06/02/23 0519 06/05/23 0556  NA 139 138 135 137 140  K 3.8 3.9 3.8 4.3 3.9  CL 100 101 103 104 106  CO2 27 25 25 24 26   GLUCOSE 90 91 87 81 95  BUN 13 25* 29* 32* 38*  CREATININE 1.21 1.66* 1.57* 1.25* 1.32*  CALCIUM 9.4 9.2 9.0 8.8* 8.9  MG 2.3 2.2 1.9 1.9 1.9  PHOS 3.6 5.3* 3.6 3.0  --     CBC: Recent Labs  Lab 05/30/23 0525 05/31/23 0430 06/01/23 0443 06/02/23 0519  WBC 6.9 6.9 7.0 6.4  NEUTROABS 4.7 4.0 4.2 3.3  HGB 13.0 13.0 12.1* 11.6*  HCT 37.5* 37.6* 35.1* 33.4*  MCV 92.1 91.9 91.6 91.0  PLT 165 169 159 162     Coagulation Studies: No results for input(s): "LABPROT", "INR" in the last 72 hours.  ASSESSMENT AND PLAN: 78 y.o. male who presents with new onset focal status epilepticus., improved with multiple AEDs. 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 EEG showed no seizures, neuro exam has been stable.   His ammonia was mildly elevated two days ago at 36 but we rechecked it today and it was normal, as was his LFT panel. His VPA level is currently therapeutic at 87.  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   Recommendations -  Continue Depakote 750 twice daily  - Continue BP control, SBP <160, for likely CAA - Continue ASA 81mg  secondary stroke prevention. D/C Plavix.   Patient has a chronic stroke and no intracranial stenosis seen, no addition of Plavix needed at this time.  - Continue Multivitamin  Neurology will sign off. Please recall with further needs.   Pt seen by Neuro NP/APP and later by MD. Note/plan to be edited by MD as needed.    Lynnae January, DNP, AGACNP-BC [facility] Please use AMION for contact information & EPIC for messaging.   Attending Neurohospitalist Addendum Patient seen and examined with APP/Resident. Agree with the history and physical as documented above. Agree with the plan as documented, which I helped formulate. I have edited the note above to reflect my full findings and recommendations. I have independently reviewed the chart, obtained history, review of systems and examined the patient.I have personally reviewed pertinent head/neck/spine imaging (CT/MRI). Please feel free to call with any questions.  -- Bing Neighbors, MD [facility] [phone]  If 7pm- 7am, please page neurology on call as listed in AMION.

## 2023-06-06 DIAGNOSIS — R569 Unspecified convulsions: Secondary | ICD-10-CM | POA: Diagnosis not present

## 2023-06-06 LAB — RPR: RPR Ser Ql: NONREACTIVE

## 2023-06-06 LAB — GLUCOSE, CAPILLARY
Glucose-Capillary: 79 mg/dL (ref 70–99)
Glucose-Capillary: 91 mg/dL (ref 70–99)
Glucose-Capillary: 90 mg/dL (ref 70–99)

## 2023-06-06 MED ORDER — QUETIAPINE FUMARATE 50 MG PO TABS
50.0000 mg | ORAL_TABLET | Freq: Every day | ORAL | Status: DC
Start: 2023-06-06 — End: 2023-06-10

## 2023-06-06 MED ORDER — DIVALPROEX SODIUM 250 MG PO DR TAB: 750.0000 mg | DELAYED_RELEASE_TABLET | Freq: Two times a day (BID) | ORAL | Status: DC

## 2023-06-06 MED ORDER — ASPIRIN 81 MG PO TBEC: 81.0000 mg | DELAYED_RELEASE_TABLET | Freq: Every day | ORAL | Status: DC

## 2023-06-06 MED ORDER — CARVEDILOL 3.125 MG PO TABS: 3.1250 mg | ORAL_TABLET | Freq: Two times a day (BID) | ORAL | Status: DC

## 2023-06-06 NOTE — Discharge Instructions (Addendum)
Management per residential hospice.

## 2023-06-06 NOTE — Discharge Summary (Signed)
 Danny Patterson NGE:[id number] DOB: 15-Sep-1945 DOA: 05/26/2023  PCP: Smitty Cords, DO  Admit date: 05/26/2023  Discharge date: 06/07/2023  Admitted From: Home   Disposition:  SNF   Recommendations for Outpatient Follow-up:   Follow up with PCP in 1-2 weeks  PCP Please obtain BMP/CBC, 2 view CXR in 1week,  (see Discharge instructions)   PCP Please follow up on the following pending results:    Home Health: None   Equipment/Devices: None  Consultations: Neuro Discharge Condition: Stable    CODE STATUS: Full    Diet Recommendation: Soft heart Healthy with feeding assistance and aspiration precautions    Chief Complaint  Patient presents with   Code Stroke     Brief history of present illness from the day of admission and additional interim summary     78 y.o. male with medical history significant of HTN, stable BPH, could not tolerate Flomax, gout, lumbar fusion, history of kidney stone presents from home with syncopal episode.  He was found to have left-sided gaze preference and left arm and leg weakness.  Presented as a code stroke.  Apparently also had a shaking episode.  Seen by neurology.  Concern was for seizure activity.  He was hospitalized for further management.                                                                   Hospital Course   Patient was discharged on 06/06/2023, await SNF bed.  No changes.     Seizure/acute metabolic encephalopathy  - Thought to have seizure activity.  Did have left-sided symptoms.  Initially thought to be stroke.  CT head and MRI brain stable, initially was on Keppra but due to encephalopathy switched to Depakote with good effect.  Echocardiogram nonacute as well.   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   It is possible that patient has old right parietal injury and due to times of stress i.e. lack of blood supply, hypoperfusion, hypotension, hypoglycemia, fever, infection he has excitable focus giving him focal seizure.  Most of his seizures affect his left arm.  He has been adequately hydrated, placed on midodrine, TED stockings, overall much better.  Now at baseline, EEG was negative, seen and cleared by neuro.  Will need SNF.     Orthostatic hypotension.  Resolved after IV fluids, TED stockings and low-dose midodrine, tapered off midodrine and monitor.  Monitor orthostatics intermittently at SNF once home diuretics are being resumed, monitor diuretic dose and fluid status closely.   AKI versus chronic kidney disease stage IV -  Patient has a different chart  under a different medical record number in the system, outpatient creatinine appears to be close to 1.7-1.9.  KI resolved.   Normocytic anemia  No evidence of overt bleeding.  Continue to monitor.   Essential hypertension - blood pressure is improved have placed him on low-dose Coreg and home diuretic, monitor at SNF and adjust.   Hyperlipidemia - Continue with statin.   Hypokalemia and hypomagnesemia.  Replaced.     History of gout   Continue with allopurinol.  Colchicine use as needed as per home regimen.   Obesity - Estimated body mass index is 33.63 kg/m, follow-up with PCP for weight loss    Discharge diagnosis     Principal Problem:   Seizure [facility])    Discharge instructions    Discharge Instructions     Ambulatory referral to Neurology   Complete by: As directed    An appointment is requested in approximately: 8 weeks   Diet - low sodium heart healthy   Complete by: As directed    Discharge instructions   Complete by: As directed    Follow with Primary MD Smitty Cords, DO in 7 days   Get CBC, CMP, Magnesium, 2 view Chest X ray -  checked next visit with your primary MD or SNF MD   Activity: As tolerated with Full fall precautions use walker/cane & assistance as needed  Disposition SNF  Diet: Soft  with feeding assistance and aspiration precautions.    Special Instructions: If you have smoked or chewed Tobacco  in the last 2 yrs please stop smoking, stop any regular Alcohol  and or any Recreational drug use.  On your next visit with your primary care physician please Get Medicines reviewed and adjusted.  Please request your Prim.MD to go over all Hospital Tests and Procedure/Radiological results at the follow up, please get all Hospital records sent to your Prim MD by signing hospital release before you go home.  If you experience worsening of your admission symptoms, develop shortness of breath, life threatening emergency, suicidal or homicidal thoughts you must seek medical attention immediately by calling 911 or calling your MD immediately  if symptoms less severe.  You Must read complete instructions/literature along with all the possible adverse reactions/side effects for all the Medicines you take and that have been prescribed to you. Take any new Medicines after you have completely understood and accpet all the possible adverse reactions/side effects.   Do not drive when taking Pain medications.  Do not take more than prescribed Pain, Sleep and Anxiety Medications  Wear Seat belts while driving.   Increase activity slowly   Complete by: As directed        Discharge Medications   Allergies as of 06/07/2023       Reactions   Ace Inhibitors Anaphylaxis   Mouth swelling    Hydrocodone Other (See Comments)   Patient began reaching for objects that are not there and restlessness        Medication List     STOP taking these medications    amLODipine 5 MG tablet Commonly known as: NORVASC   baclofen 10 MG  tablet Commonly known as: LIORESAL   Potassium 99 MG Tabs       TAKE these medications    allopurinol 300 MG tablet Commonly known as: ZYLOPRIM Take 300 mg by mouth daily.   ALPHA LIPOIC ACID PO Take 1 tablet by mouth daily.   aspirin EC 81 MG tablet Take 1 tablet (81  mg total) by mouth daily. Swallow whole.   atorvastatin 20 MG tablet Commonly known as: LIPITOR Take 20 mg by mouth daily.   carvedilol 3.125 MG tablet Commonly known as: COREG Take 1 tablet (3.125 mg total) by mouth 2 (two) times daily with a meal.   colchicine 0.6 MG tablet Take 0.6 mg by mouth daily as needed (for gout).   divalproex 250 MG DR tablet Commonly known as: DEPAKOTE Take 3 tablets (750 mg total) by mouth every 12 (twelve) hours.   furosemide 20 MG tablet Commonly known as: LASIX Take 20 mg by mouth daily as needed for fluid.   multivitamin with minerals Tabs tablet Take 1 tablet by mouth daily.   potassium citrate 10 MEQ (1080 MG) SR tablet Commonly known as: UROCIT-K Take 10 mEq by mouth daily.   QUEtiapine 50 MG tablet Commonly known as: SEROQUEL Take 1 tablet (50 mg total) by mouth at bedtime.         Contact information for follow-up providers     Smitty Cords, DO. Schedule an appointment as soon as possible for a visit in 1 week(s).   Specialty: Family Medicine Contact information: [address] [place] [address] [phone]         [facility]. Schedule an appointment as soon as possible for a visit in 1 week(s).   Contact information: [address]     [address] [place] [address] [phone]             Contact information for after-discharge care     Destination     [facility] Preferred SNF .   Service: Skilled Nursing Contact information: [address] [place] [address] [phone]                      Major procedures and Radiology Reports - PLEASE review detailed and final reports thoroughly  -      Overnight EEG with video Result Date: 06/01/2023 Charlsie Quest, MD     06/02/2023 10:13 AM [name] MRN: [id number] Epilepsy Attending: Charlsie Quest Referring Physician/Provider: Lynnae January, NP Duration: 05/31/2023 1539 to 06/01/2023 1034 Patient history:  78 yo M with syncopal episode at the kitchen table and fell around at 1420. Now with Left-sided gaze preference, left arm and leg weakness and hemianopia of the left side. Also developed numbness of the left leg. EEG to evaluate for seizure  Level of alertness:  awake, asleep  AEDs during EEG study: LEV  Technical aspects: This EEG study was done with scalp electrodes positioned according to the 10-20 International system of electrode placement. Electrical activity was reviewed with band pass filter of 1-70Hz , sensitivity of 7 uV/mm, display speed of 19mm/sec with a 60Hz  notched filter applied as appropriate. EEG data were recorded continuously and digitally stored.  Video monitoring was available and reviewed as appropriate.  Description: The posterior dominant rhythm consists of 7.5 Hz activity of moderate voltage (25-35 uV) seen predominantly in posterior head regions, symmetric and reactive to eye opening and eye closing. EEG showed continuous generalized and maximal right parieto-occipital 3 to 6 Hz theta-delta slowing admixed with 13-15hz  beta activity. Sleep was characterized by vertex waves, sleep spindles (12 to 14 Hz), maximal frontocentral region. Hyperventilation and photic stimulation were not performed.    ABNORMALITY -Continuous slow, generalized and maximal right parieto-occipital region  IMPRESSION: This study  is suggestive of cortical dysfunction in right parieto-occipital region likely secondary to underlying encephalomalacia. Additionally there is moderate diffuse encephalopathy. No seizures or  epileptiform discharges were noted.  Priyanka Annabelle Harman   US RENAL Result Date: 06/01/2023 CLINICAL DATA:  Acute kidney injury EXAM: RENAL / URINARY TRACT ULTRASOUND COMPLETE COMPARISON:  CT 05/27/2022 FINDINGS: Right Kidney: Renal measurements: 11.8 by 5.5 x 4.9 cm = volume: 166.5 mL. Echogenicity within normal limits. No mass or hydronephrosis visualized. Left Kidney: Renal measurements: 10.1 x 5.8 x 4.4 cm = volume: 135 mL. Echogenicity within normal limits. No mass or hydronephrosis visualized. Bladder: Appears normal for degree of bladder distention. Other: None. IMPRESSION: Negative renal ultrasound. Electronically Signed   By: Jasmine Pang M.D.   On: 06/01/2023 00:07   CT ANGIO HEAD NECK W WO CM (CODE STROKE) Result Date: 05/31/2023 CLINICAL DATA:  Code stroke. 78 year old male with weakness. Leftward gaze. EXAM: CT ANGIOGRAPHY HEAD AND NECK TECHNIQUE: Multidetector CT imaging of the head and neck was performed using the standard protocol during bolus administration of intravenous contrast. Multiplanar CT image reconstructions and MIPs were obtained to evaluate the vascular anatomy. Carotid stenosis measurements (when applicable) are obtained utilizing NASCET criteria, using the distal internal carotid diameter as the denominator. RADIATION DOSE REDUCTION: This exam was performed according to the departmental dose-optimization program which includes automated exposure control, adjustment of the mA and/or kV according to patient size and/or use of iterative reconstruction technique. CONTRAST:  75mL OMNIPAQUE IOHEXOL 350 MG/ML SOLN COMPARISON:  Recent CTA head and neck 05/26/2023. FINDINGS: CTA NECK Skeleton: Stable. Diffuse idiopathic skeletal hyperostosis (DISH). Bulky cervical spine degeneration. Upper chest: Stable.  Mild respiratory motion. Other neck: Neck soft tissue contours are within normal limits. Aortic arch: 3 vessel arch not completely visible today. Stable mild arch tortuosity, minimal  atherosclerosis. Right carotid system: Patent and stable. Relatively mild proximal right ICA calcified plaque. Left carotid system: Patent and stable. Relatively mild left carotid bifurcation plaque. Vertebral arteries: Patent and stable. Codominant vertebral arteries with tortuosity but no significant plaque. CTA HEAD Posterior circulation: Patent and stable. Mild for age posterior circulation atherosclerosis with no significant stenosis. Anterior circulation: Both ICA siphons are patent with motion artifact at the supraclinoid segments. Bilateral siphon calcified plaque. Mild to moderate supraclinoid stenosis on the right, no definite stenosis on the left. Patent carotid termini, MCA and ACA origins. Normal anterior communicating artery. Bilateral ACA and MCA branches are stable with no significant stenosis when allowing for mild motion. Venous sinuses: Early contrast timing, not well evaluated. Anatomic variants: None significant. Review of the MIP images confirms the above findings IMPRESSION: 1. No large vessel occlusion. Stable CTA from 05/26/2023 with mild for age atherosclerosis in the head and neck. 2. 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 No other significant is stenosis identified. Electronically Signed   By: Odessa Fleming M.D.   On: 05/31/2023 13:54   CT HEAD CODE STROKE WO CONTRAST Result Date: 05/31/2023 CLINICAL DATA:  Code stroke. 78 year old male with weakness. Leftward gaze. EXAM: CT HEAD WITHOUT CONTRAST TECHNIQUE: Contiguous axial images were obtained from the base of the skull through the vertex without intravenous contrast. RADIATION DOSE REDUCTION: This exam was performed according to the departmental dose-optimization program which includes automated exposure control, adjustment of the mA and/or kV according to patient size and/or use of iterative reconstruction technique. COMPARISON:  Brain MRI yesterday.  Head CT 05/26/2023. FINDINGS: Brain: No midline shift,  mass effect, or evidence  of intracranial mass lesion. No acute intracranial hemorrhage identified. No ventriculomegaly. Stable gray-white matter differentiation throughout the brain. Confluent right parietal lobe subcortical and white matter hypodensity appears stable from FLAIR signal changes yesterday. Extensive chronic microhemorrhages and hemosiderin in the brain associated. No cortically based acute infarct identified. Vascular: Calcified atherosclerosis at the skull base. No suspicious intracranial vascular hyperdensity. Skull: Stable, intact. Sinuses/Orbits: Visualized paranasal sinuses and mastoids are stable and well aerated. Other: No significant gaze deviation on these images. Visualized scalp soft tissues are within normal limits. ASPECTS [facility] Stroke Program Early CT Score) Total score (0-10 with 10 being normal): 10 IMPRESSION: 1. Stable non contrast CT appearance of the brain. No acute cortically based infarct or acute intracranial hemorrhage identified. ASPECTS 10. 2. Confluent right parietal lobe hypodensity stable from MRI yesterday. Query acute versus chronic amyloid related inflammation given that appearance. Salient findings were communicated to Dr. Amada Jupiter at 1:46 pm on 05/31/2023 by text page via the [facility] messaging system. Electronically Signed   By: Odessa Fleming M.D.   On: 05/31/2023 13:47   MR BRAIN WO CONTRAST Result Date: 05/30/2023 CLINICAL DATA:  Seizure disorder EXAM: MRI HEAD WITHOUT CONTRAST TECHNIQUE: Multiplanar, multiecho pulse sequences of the brain and surrounding structures were obtained without intravenous contrast. COMPARISON:  None Available. FINDINGS: Brain: No acute infarct, mass effect or extra-axial collection. Numerous chronic microhemorrhages in a predominantly peripheral distribution. Chronic subarachnoid blood over the left frontal lobe and medial right parietal lobe. There is multifocal hyperintense T2-weighted signal within the white matter. Generalized volume  loss. Old right parietal infarct. The midline structures are normal. Vascular: Normal flow voids. Skull and upper cervical spine: Normal calvarium and skull base. Visualized upper cervical spine and soft tissues are normal. Sinuses/Orbits:No paranasal sinus fluid levels or advanced mucosal thickening. No mastoid or middle ear effusion. Normal orbits. IMPRESSION: 1. No acute intracranial abnormality. 2. Numerous chronic microhemorrhages in a predominantly peripheral distribution, most consistent with cerebral amyloid angiopathy. 3. 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 Electronically Signed   By: Deatra Robinson M.D.   On: 05/30/2023 19:08   ECHOCARDIOGRAM COMPLETE Result Date: 05/30/2023    ECHOCARDIOGRAM REPORT   Patient Name:   Danny Patterson Date of Exam: 05/30/2023 Medical Rec #:  [id number]   Height:       70.0 in Accession #:    [id number]  Weight:       246.7 lb Date of Birth:  20-May-1945   BSA:          2.282 m Patient Age:    78 years    BP:           172/83 mmHg Patient Gender: M           HR:           82 bpm. Exam Location:  Inpatient Procedure: 2D Echo, Cardiac Doppler, Color Doppler and Intracardiac            Opacification Agent (Both Spectral and Color Flow Doppler were            utilized during procedure). Indications:    Stroke  History:        Patient has prior history of Echocardiogram examinations, most                 recent 10/08/2015.  Sonographer:    Karma Ganja Referring Phys: [phone] MCNEILL P KIRKPATRICK  Sonographer Comments: Technically difficult study due to poor echo windows and no subcostal  window. Image acquisition challenging due to patient body habitus and Image acquisition challenging due to uncooperative patient. IMPRESSIONS  1. Left ventricular ejection fraction, by estimation, is 60 to 65%. The left ventricle has normal function. The left ventricle has no regional wall motion abnormalities. There is mild concentric left ventricular hypertrophy.  Left ventricular diastolic function could not be evaluated.  2. Right ventricular systolic function is normal. The right ventricular size is normal.  3. The mitral valve is normal in structure. No evidence of mitral valve regurgitation. No evidence of mitral stenosis.  4. The aortic valve has an indeterminant number of cusps. Aortic valve regurgitation is not visualized. No aortic stenosis is present.  5. The inferior vena cava is normal in size with greater than 50% respiratory variability, suggesting right atrial pressure of 3 mmHg. FINDINGS  Left Ventricle: Left ventricular ejection fraction, by estimation, is 60 to 65%. The left ventricle has normal function. The left ventricle has no regional wall motion abnormalities. Definity contrast agent was given IV to delineate the left ventricular  endocardial borders. The left ventricular internal cavity size was normal in size. There is mild concentric left ventricular hypertrophy. Left ventricular diastolic function could not be evaluated due to indeterminate diastolic function. Left ventricular diastolic function could not be evaluated. Right Ventricle: The right ventricular size is normal. No increase in right ventricular wall thickness. Right ventricular systolic function is normal. Left Atrium: Left atrial size was normal in size. Right Atrium: Right atrial size was normal in size. Pericardium: There is no evidence of pericardial effusion. Mitral Valve: The mitral valve is normal in structure. No evidence of mitral valve regurgitation. No evidence of mitral valve stenosis. Tricuspid Valve: The tricuspid valve is normal in structure. Tricuspid valve regurgitation is not demonstrated. No evidence of tricuspid stenosis. Aortic Valve: The aortic valve has an indeterminant number of cusps. Aortic valve regurgitation is not visualized. No aortic stenosis is present. Aortic valve mean gradient measures 4.0 mmHg. Aortic valve peak gradient measures 7.2 mmHg. Aortic valve  area, by VTI measures 2.33 cm. Pulmonic Valve: The pulmonic valve was not well visualized. Pulmonic valve regurgitation is not visualized. No evidence of pulmonic stenosis. Aorta: The aortic root is normal in size and structure. Venous: The inferior vena cava is normal in size with greater than 50% respiratory variability, suggesting right atrial pressure of 3 mmHg. IAS/Shunts: No atrial level shunt detected by color flow Doppler.  LEFT VENTRICLE PLAX 2D LVIDd:         5.60 cm      Diastology LVIDs:         2.70 cm      LV e' medial:    6.68 cm/s LV PW:         1.30 cm      LV E/e' medial:  8.0 LV IVS:        1.30 cm      LV e' lateral:   6.68 cm/s LVOT diam:     2.10 cm      LV E/e' lateral: 8.0 LV SV:         57 LV SV Index:   25 LVOT Area:     3.46 cm  LV Volumes (MOD) LV vol d, MOD A2C: 135.0 ml LV vol d, MOD A4C: 151.0 ml LV vol s, MOD A2C: 47.0 ml LV vol s, MOD A4C: 54.4 ml LV SV MOD A2C:     88.0 ml LV SV MOD A4C:     151.0  ml LV SV MOD BP:      92.2 ml LEFT ATRIUM           Index LA diam:      4.40 cm 1.93 cm/m LA Vol (A4C): 88.4 ml 38.74 ml/m  AORTIC VALVE AV Area (Vmax):    2.46 cm AV Area (Vmean):   2.25 cm AV Area (VTI):     2.33 cm AV Vmax:           134.00 cm/s AV Vmean:          90.500 cm/s AV VTI:            0.247 m AV Peak Grad:      7.2 mmHg AV Mean Grad:      4.0 mmHg LVOT Vmax:         95.00 cm/s LVOT Vmean:        58.700 cm/s LVOT VTI:          0.166 m LVOT/AV VTI ratio: 0.67  AORTA Ao Root diam: 3.40 cm Ao Asc diam:  3.30 cm MITRAL VALVE MV Area (PHT): 2.87 cm    SHUNTS MV Decel Time: 264 msec    Systemic VTI:  0.17 m MV E velocity: 53.60 cm/s  Systemic Diam: 2.10 cm MV A velocity: 82.00 cm/s MV E/A ratio:  0.65 Aditya Sabharwal Electronically signed by Dorthula Nettles Signature Date/Time: 05/30/2023/12:01:41 PM    Final    DG Chest Port 1 View Result Date: 05/30/2023 CLINICAL DATA:  Shortness of breath.  Stroke. EXAM: PORTABLE CHEST 1 VIEW COMPARISON:  03/29/2022 FINDINGS: Stable  cardiomediastinal contours. Aortic atherosclerosis. Asymmetric elevation of right hemidiaphragm, unchanged. Increase opacity within the retrocardiac left lower lobe which may reflect atelectasis or airspace disease. Visualized osseous structures are notable for remote healed left clavicle fracture. No acute osseous findings. IMPRESSION: Increase opacity within the retrocardiac left lower lobe which may reflect atelectasis or airspace disease. Electronically Signed   By: Signa Kell M.D.   On: 05/30/2023 06:30   Overnight EEG with video Result Date: 05/27/2023 Charlsie Quest, MD     05/28/2023  7:31 AM [name] MRN: [id number] Epilepsy Attending: Charlsie Quest Referring Physician/Provider: Rejeana Brock, MD Duration: 05/26/2023 2258 to 05/27/2023 2258 Patient history:  78 yo M with syncopal episode at the kitchen table and fell around at 1420. Now with Left-sided gaze preference, left arm and leg weakness and hemianopia of the left side. Also developed numbness of the left leg. EEG to evaluate for seizure  Level of alertness:  awake, asleep  AEDs during EEG study: LEV  Technical aspects: This EEG study was done with scalp electrodes positioned according to the 10-20 International system of electrode placement. Electrical activity was reviewed with band pass filter of 1-70Hz , sensitivity of 7 uV/mm, display speed of 65mm/sec with a 60Hz  notched filter applied as appropriate. EEG data were recorded continuously and digitally stored.  Video monitoring was available and reviewed as appropriate.  Description: No posterior dominant rhythm was seen. EEG showed continuous generalized and maximal right parieto-occipital 3 to 6 Hz theta-delta slowing admixed with 13-15hz  beta activity. Sleep was characterized by vertex waves, sleep spindles (12 to 14 Hz), maximal frontocentral region.  Hyperventilation and photic stimulation were not performed.    ABNORMALITY -Continuous slow, generalized and  maximal right parieto-occipital region  IMPRESSION: This study is suggestive of cortical dysfunction in right parieto-occipital region likely secondary to underlying encephalomalacia.  Additionally there is moderate diffuse encephalopathy. No  seizures or epileptiform discharges were noted.  Charlsie Quest    EEG adult Result Date: 05/26/2023 Charlsie Quest, MD     05/26/2023  6:49 PM [name] MRN: [id number] Epilepsy Attending: Charlsie Quest Referring Physician/Provider: Rejeana Brock, MD Date: 05/26/2023 Duration: 22.24 mins Patient history: 78 yo M with syncopal episode at the kitchen table and fell around at 1420. Now with Left-sided gaze preference, left arm and leg weakness and hemianopia of the left side. Also developed numbness of the left leg. EEG to evaluate for seizure Level of alertness:  lethargic/coma AEDs during EEG study: LEV Technical aspects: This EEG study was done with scalp electrodes positioned according to the 10-20 International system of electrode placement. Electrical activity was reviewed with band pass filter of 1-70Hz , sensitivity of 7 uV/mm, display speed of 59mm/sec with a 60Hz  notched filter applied as appropriate. EEG data were recorded continuously and digitally stored.  Video monitoring was available and reviewed as appropriate. Description: EEG showed continuous generalized and maximal right parieto-occipital 3 to 6 Hz theta-delta slowing. Lateralized periodic discharges with overriding fast activity were noted in right hemisphere, maximal right parieto-occipital region at 1 to 1.5 Hz, at times with overlying rhythmicity and waxing and waning morphology. Hyperventilation and photic stimulation were not performed.   ABNORMALITY -Lateralized periodic discharges with overriding fast activity and rhythmicity, right hemisphere, maximal right parieto-occipital region -Continuous slow, generalized and maximal right parieto-occipital region IMPRESSION: This  study showed evidence of epileptogenicity arising from right hemisphere, maximal right parieto-occipital region.  This EEG pattern is on the ictal-interictal continuum.  However, due to overlying fast activity, overlying rhythmicity as well as waxing and waning morphology, it is highly concerning for ictal nature. Additionally there is cortical dysfunction in right parieto-occipital region likely secondary to underlying encephalomalacia.  Lastly there is moderate diffuse encephalopathy. If concern for ictal-interictal activity persist, consider long-term EEG Dr. Amada Jupiter was notified Charlsie Quest   CT ANGIO HEAD NECK W WO CM W PERF (CODE STROKE) Result Date: 05/26/2023 CLINICAL DATA:  Code stroke. Left-sided paralysis. Last known well at 11 o'clock a.m. EXAM: CT ANGIOGRAPHY HEAD AND NECK CT PERFUSION BRAIN TECHNIQUE: Multidetector CT imaging of the head and neck was performed using the standard protocol during bolus administration of intravenous contrast. Multiplanar CT image reconstructions and MIPs were obtained to evaluate the vascular anatomy. Carotid stenosis measurements (when applicable) are obtained utilizing NASCET criteria, using the distal internal carotid diameter as the denominator. Multiphase CT imaging of the brain was performed following IV bolus contrast injection. Subsequent parametric perfusion maps were calculated using RAPID software. RADIATION DOSE REDUCTION: This exam was performed according to the departmental dose-optimization program which includes automated exposure control, adjustment of the mA and/or kV according to patient size and/or use of iterative reconstruction technique. CONTRAST:  OMNIPAQUE IOHEXOL 350 MG/ML SOLN COMPARISON:  100 mL Omnipaque 350 scratched at CT head without contrast 05/26/2023 at 3:20 p.m. FINDINGS: CTA NECK FINDINGS Aortic arch: A 3 vessel arch configuration is present. Minimal atherosclerotic calcifications are present in the distal arch. The  great vessel origins are within normal limits. No focal stenosis or aneurysm is present. No dissection is present. Right carotid system: The right common carotid artery is within normal limits. Minimal calcifications are present in the proximal right external carotid artery and slightly more distal right internal carotid artery without significant stenosis. Cervical right ICA is otherwise normal. Left carotid system: The left common carotid artery is within normal limits.  Minimal calcifications are present at the left carotid bifurcation without significant stenosis. The cervical left ICA is otherwise normal. Vertebral arteries: The vertebral arteries are codominant. Both vertebral arteries originate from the subclavian arteries without significant stenosis. No significant stenosis is present in either vertebral artery in neck. Skeleton: Moderate degenerative changes are present in the cervical spine. Facet hypertrophy contributes to moderate right foraminal stenosis at C3-4 uncovertebral spurring contributes to moderate foraminal stenosis bilaterally at C6-7. Other neck: The soft tissues of the neck are otherwise unremarkable. Salivary glands are within normal limits. Thyroid is normal. No significant adenopathy is present. No focal mucosal or submucosal lesions are present. Upper chest: The lung apices are clear. The thoracic inlet is within normal limits. Review of the MIP images confirms the above findings CTA HEAD FINDINGS Anterior circulation: Atherosclerotic calcifications are present within the cavernous internal carotid arteries without a significant stenosis through the ICA termini. The left A1 segment is dominant. The anterior communicating artery is patent. The right M1 segment is normal. Moderate stenosis is present in mid left M1 segment. MCA bifurcations are intact bilaterally. Moderate attenuation of distal ACA and MCA branch vessels present without a significant focal stenosis or occlusion with in  the proximal circle-of-Willis. No aneurysm is present. Posterior circulation: The vertebral arteries are codominant. Left PICA origin is visualized and normal. The vertebrobasilar junction basilar artery normal. Both posterior cerebral arteries originate basilar tip. Moderate segmental stenoses are present in the left P2 segment. The PCA branch vessels are otherwise within normal limits. No aneurysm is present. Venous sinuses: The dural sinuses are patent. The straight sinus and deep cerebral veins are intact. Cortical veins are within normal limits. No significant vascular malformation is evident. Anatomic variants: None Review of the MIP images confirms the above findings CT Brain Perfusion Findings: ASPECTS: 10/10 CBF (<30%) Volume: 0mL Perfusion (Tmax>6.0s) volume: 0mL Mismatch Volume: 0mL IMPRESSION: 1. No large vessel occlusion. 2. Moderate stenosis in the mid left M1 segment. 3. Moderate segmental stenoses in the left P2 segment. 4. Moderate attenuation of distal ACA and MCA branch vessels without a significant focal stenosis or occlusion. 5. Minimal atherosclerotic changes at the carotid bifurcations and cavernous internal carotid arteries without significant stenosis. 6. Moderate degenerative changes in the cervical spine. 7. Moderate right foraminal stenosis at C3-4 and moderate foraminal stenosis bilaterally at C6-7. 8. CT perfusion is negative. The above was relayed via text pager to Dr. Amada Jupiter on 05/26/2023 at 15:50 . Electronically Signed   By: Marin Roberts M.D.   On: 05/26/2023 15:52   CT HEAD CODE STROKE WO CONTRAST Result Date: 05/26/2023 CLINICAL DATA:  Code stroke.  Left-sided paralysis. EXAM: CT HEAD WITHOUT CONTRAST TECHNIQUE: Contiguous axial images were obtained from the base of the skull through the vertex without intravenous contrast. RADIATION DOSE REDUCTION: This exam was performed according to the departmental dose-optimization program which includes automated exposure  control, adjustment of the mA and/or kV according to patient size and/or use of iterative reconstruction technique. COMPARISON:  None available FINDINGS: Brain: Encephalomalacia involving the posterior and medial right parietal lobe appears remote. Moderate generalized atrophy and white matter disease is present bilaterally. No acute infarct, hemorrhage or mass lesion is present. Deep brain nuclei are within normal limits. The ventricles are of normal size. No significant extraaxial fluid collection is present. The brainstem and cerebellum are within normal limits. Midline structures are within normal limits. Vascular: Atherosclerotic calcifications are present within the cavernous internal carotid arteries bilaterally and at the dural margin  of the left vertebral artery. No hyperdense vessel is present. Skull: Calvarium is intact. No focal lytic or blastic lesions are present. No significant extracranial soft tissue lesion is present. Advanced degenerative changes are present in the right TMJ. Sinuses/Orbits: The paranasal sinuses and mastoid air cells are clear. The globes and orbits are within normal limits. ASPECTS [facility] Stroke Program Early CT Score) - Ganglionic level infarction (caudate, lentiform nuclei, internal capsule, insula, M1-M3 cortex): 7/7 - Supraganglionic infarction (M4-M6 cortex): 3/3 Total score (0-10 with 10 being normal): 10/10 IMPRESSION: 1. No acute intracranial abnormality or significant interval change. 2. Encephalomalacia involving the posterior and medial right parietal lobe appears remote. 3. Moderate generalized atrophy and white matter disease likely reflects the sequela of chronic microvascular ischemia. 4. Aspects is 10/10. 5. Advanced degenerative changes in the right TMJ. The above was relayed via text pager to Dr. Amada Jupiter on 05/26/2023 at 15:28 . Electronically Signed   By: Marin Roberts M.D.   On: 05/26/2023 15:31    Micro Results    No results found for this  or any previous visit (from the past 240 hours).  Today   Subjective    Danny Patterson today has no headache,no chest abdominal pain,no new weakness tingling or numbness, feels much better     Objective   Blood pressure (!) 159/77, pulse 63, temperature 97.9 F (36.6 C), temperature source Axillary, resp. rate 18, height 5\' 10"  (1.778 m), weight 106.3 kg, SpO2 96%.   Intake/Output Summary (Last 24 hours) at 06/07/2023 1041 Last data filed at 06/06/2023 1600 Gross per 24 hour  Intake --  Output 800 ml  Net -800 ml    Exam  Awake, minimally confused, oriented x 2, No new F.N deficits,    Cotati.AT,PERRAL Supple Neck,   Symmetrical Chest wall movement, Good air movement bilaterally, CTAB RRR,No Gallops,   +ve B.Sounds, Abd Soft, Non tender,  No Cyanosis, Clubbing or edema    Data Review   Recent Labs  Lab 06/01/23 0443 06/02/23 0519  WBC 7.0 6.4  HGB 12.1* 11.6*  HCT 35.1* 33.4*  PLT 159 162  MCV 91.6 91.0  MCH 31.6 31.6  MCHC 34.5 34.7  RDW 14.6 14.6  LYMPHSABS 2.1 2.2  MONOABS 0.5 0.5  EOSABS 0.2 0.3  BASOSABS 0.0 0.1    Recent Labs  Lab 05/31/23 1439 06/01/23 0443 06/02/23 0519 06/04/23 1512 06/05/23 0556  NA  --  135 137  --  140  K  --  3.8 4.3  --  3.9  CL  --  103 104  --  106  CO2  --  25 24  --  26  ANIONGAP  --  7 9  --  8  GLUCOSE  --  87 81  --  95  BUN  --  29* 32*  --  38*  CREATININE  --  1.57* 1.25*  --  1.32*  AST  --  31 29 29   --   ALT  --  19 19 20   --   ALKPHOS  --  55 52 61  --   BILITOT  --  0.7 0.6 0.7  --   ALBUMIN  --  3.0* 2.7* 2.6*  --   TSH 4.546*  --   --   --   --   AMMONIA  --  29 36* 28  --   BNP  --  42.0 69.8  --   --   MG  --  1.9 1.9  --  1.9  PHOS  --  3.6 3.0  --   --   CALCIUM  --  9.0 8.8*  --  8.9    Total Time in preparing paper work, data evaluation and todays exam - 35 minutes  Signature  -    Susa Raring M.D on 06/07/2023 at 10:41 AM   -  To page go to [email or web address]

## 2023-06-06 NOTE — Progress Notes (Signed)
 Physical Therapy Treatment Patient Details Name: Danny Patterson MRN: [id number] DOB: Nov 28, 1945 Today's Date: 06/06/2023   History of Present Illness Pt is a 78 y.o. M who presents with left sided gaze preference and weakness. Presented as code stroke. CT head negative for acute abnormality. Now being worked up for seizure. 3/11 episode of decr responsiveness; head CT negative. Some tremulous activity with ?seizure but also hypotensive. EEG pending Significant PMH: HTN, stable BPH, gout, lumbar fusion, history of kidney stone.    PT Comments  Patient progressing to hallway ambulation this session and more alert and oriented.  He needed cues and increased assist when fatigued and cue to sit to rest due to decreased safety.  Patient remains appropriate for inpatient rehab prior to d/c home.  PT will continue to follow.     If plan is discharge home, recommend the following: A lot of help with walking and/or transfers;Assistance with cooking/housework;Assist for transportation;Help with stairs or ramp for entrance;Supervision due to cognitive status   Can travel by private vehicle        Equipment Recommendations  BSC/3in1;Wheelchair (measurements PT);Wheelchair cushion (measurements PT)    Recommendations for Other Services       Precautions / Restrictions Precautions Precautions: Fall Recall of Precautions/Restrictions: Impaired     Mobility  Bed Mobility Overal bed mobility: Needs Assistance Bed Mobility: Supine to Sit     Supine to sit: HOB elevated, Used rails, Min assist, Mod assist     General bed mobility comments: lifting assist for trunk    Transfers Overall transfer level: Needs assistance Equipment used: Rolling walker (2 wheels)   Sit to Stand: Mod assist           General transfer comment: some lifting help to stand    Ambulation/Gait Ambulation/Gait assistance: Mod assist, Min assist Gait Distance (Feet): 140 Feet Assistive device: Rolling walker (2  wheels) Gait Pattern/deviations: Step-through pattern, Step-to pattern, Decreased stride length, Trunk flexed, Shuffle       General Gait Details: cues for upright posture, forward gaze, had chair follow and pt walked almost back to room around unit circle.  Started veering into R wall when fatigued and needed increased assist for staying straight and cue to sit as felt pt fatigued but trying to push himself.   Stairs             Wheelchair Mobility     Tilt Bed    Modified Rankin (Stroke Patients Only)       Balance Overall balance assessment: Needs assistance Sitting-balance support: Feet supported Sitting balance-Leahy Scale: Fair     Standing balance support: Bilateral upper extremity supported Standing balance-Leahy Scale: Poor Standing balance comment: UE support for balance                            Communication Communication Communication: No apparent difficulties  Cognition Arousal: Alert Behavior During Therapy: Flat affect   PT - Cognitive impairments: Attention, Memory, Sequencing, Problem solving, Safety/Judgement                       PT - Cognition Comments: slow processing, following commands well this session, cues for walker safety and forward gaze Following commands: Intact Following commands impaired: Only follows one step commands consistently, Follows one step commands with increased time    Cueing Cueing Techniques: Verbal cues, Gestural cues, Visual cues  Exercises      General Comments General comments (  skin integrity, edema, etc.): VSS during mobility on RA      Pertinent Vitals/Pain Pain Assessment Pain Assessment: Faces Faces Pain Scale: Hurts a little bit Pain Location: generalized with mobility Pain Descriptors / Indicators: Grimacing, Discomfort Pain Intervention(s): Monitored during session, Repositioned    Home Living                          Prior Function            PT Goals  (current goals can now be found in the care plan section) Progress towards PT goals: Progressing toward goals    Frequency    Min 3X/week      PT Plan      Co-evaluation              AM-PAC PT "6 Clicks" Mobility   Outcome Measure  Help needed turning from your back to your side while in a flat bed without using bedrails?: A Little Help needed moving from lying on your back to sitting on the side of a flat bed without using bedrails?: A Little Help needed moving to and from a bed to a chair (including a wheelchair)?: A Little Help needed standing up from a chair using your arms (e.g., wheelchair or bedside chair)?: A Lot Help needed to walk in hospital room?: A Lot Help needed climbing 3-5 steps with a railing? : Total 6 Click Score: 14    End of Session Equipment Utilized During Treatment: Gait belt Activity Tolerance: Patient limited by fatigue Patient left: in chair;with call bell/phone within reach Nurse Communication: Mobility status PT Visit Diagnosis: Unsteadiness on feet (R26.81);History of falling (Z91.81);Difficulty in walking, not elsewhere classified (R26.2)     Time: 1010-1036 PT Time Calculation (min) (ACUTE ONLY): 26 min  Charges:    $Gait Training: 8-22 mins $Therapeutic Activity: 8-22 mins PT General Charges $$ ACUTE PT VISIT: 1 Visit                     Danny Patterson, PT Acute Rehabilitation Services Office:[phone] 06/06/2023    Danny Patterson 06/06/2023, 2:06 PM

## 2023-06-06 NOTE — TOC Progression Note (Addendum)
 Transition of Care [facility]) - Progression Note    Patient Details  Name: Danny Patterson MRN: [id number] Date of Birth: 1945/05/01  [facility] CM/SW Contact  Erin Sons, [place] Phone Number: 06/06/2023, 3:52 PM  Clinical Narrative:     CSW called pt's daughter to discuss SNF choice. She is interested in [facility] or [address] which had not responded to referrals. CSW reached out to these facilities; await response.   1530: [facility] offered a bed. CSW confirmed with daughter she is agreeable to [facility]. They have a private room for pt.  CSW explained auth process.   Left voicemail with [facility] requesting call back to initiate SNF auth.   1630: Auth Started with [facility]; status pending    Barriers to Discharge: Insurance Authorization  Expected Discharge Plan and Services       Living arrangements for the past 2 months: Single Family Home Expected Discharge Date: 06/06/23                                     Social Determinants of Health (SDOH) Interventions SDOH Screenings   Food Insecurity: Patient Unable To Answer (05/27/2023)  Housing: Low Risk  (05/31/2023)  Transportation Needs: Patient Unable To Answer (05/27/2023)  Utilities: Patient Unable To Answer (05/27/2023)    Readmission Risk Interventions     No data to display

## 2023-06-06 NOTE — Progress Notes (Signed)
 Plan of care is reviewed. Pt has been progressing. Awaiting for placement at SNF. He has generalized weakness, but alert and oriented x 3, cooperative, able to follow commands able to ambulate to [facility] with 2 full maximum assisted. His vital signs remain stable, afebrile, NSR on the monitor, normal respiratory effort, no obvious acute distress noted overnight. Family members visit today and they are very supportive at bedside. Pt has no major complaints. We will continue to monitor.  Dian Situ, RN

## 2023-06-07 DIAGNOSIS — R569 Unspecified convulsions: Secondary | ICD-10-CM | POA: Diagnosis not present

## 2023-06-07 MED ORDER — HALOPERIDOL LACTATE 5 MG/ML IJ SOLN: 5.0000 mg | Freq: Once | INTRAMUSCULAR | Status: AC

## 2023-06-07 NOTE — Care Management (Signed)
 Patients oxygen saturation decreased to 78-84 for periods of time while sleeping, patient noted to have multiple apneic episodes, placed on 1L  while sleeping.

## 2023-06-07 NOTE — Progress Notes (Signed)
 Occupational Therapy Treatment Patient Details Name: Danny Patterson MRN: [id number] DOB: 04-19-1945 Today's Date: 06/07/2023   History of present illness Pt is a 78 y.o. M who presents with left sided gaze preference and weakness. Presented as code stroke. CT head negative for acute abnormality. Now being worked up for seizure. 3/11 episode of decr responsiveness; head CT negative. Some tremulous activity with ?seizure but also hypotensive. EEG pending Significant PMH: HTN, stable BPH, gout, lumbar fusion, history of kidney stone.   OT comments  Pt making steady progress towards OT goals though remains limited by deficits in cognition, balance and strength. Overall, pt requires up to Mod A for bed mobility and transfers using RW w/ consistent sequencing cues needed to complete tasks. Pt able to engage in EOB bathing with overall Mod A and good effort. Noted insurance denied intensive rehab stay so updating recommendations to inpatient follow up therapy, <3 hours/day as pt is below functional baseline and remains a high fall risk.   Spo2 95% on RA      If plan is discharge home, recommend the following:  A lot of help with walking and/or transfers;Two people to help with walking and/or transfers;A lot of help with bathing/dressing/bathroom;Two people to help with bathing/dressing/bathroom   Equipment Recommendations  Other (comment) (RW; TBD)    Recommendations for Other Services      Precautions / Restrictions Precautions Precautions: Fall Recall of Precautions/Restrictions: Impaired Restrictions Weight Bearing Restrictions Per Provider Order: No       Mobility Bed Mobility Overal bed mobility: Needs Assistance Bed Mobility: Supine to Sit     Supine to sit: Mod assist, HOB elevated     General bed mobility comments: use of bed pad to scoot hips EOB and bring LE off of bed    Transfers Overall transfer level: Needs assistance Equipment used: Rolling walker (2  wheels) Transfers: Sit to/from Stand, Bed to chair/wheelchair/BSC Sit to Stand: Mod assist     Step pivot transfers: Min assist     General transfer comment: Mod A to stand from elevated bed. significant cues for RW use and sequencing steps to recliner     Balance Overall balance assessment: Needs assistance Sitting-balance support: Feet supported Sitting balance-Leahy Scale: Fair     Standing balance support: Bilateral upper extremity supported Standing balance-Leahy Scale: Poor                             ADL either performed or assessed with clinical judgement   ADL Overall ADL's : Needs assistance/impaired Eating/Feeding: Set up;Sitting Eating/Feeding Details (indicate cue type and reason): assist to open containers but able to feed self once tray prepped Grooming: Supervision/safety;Sitting;Oral care;Wash/dry face Grooming Details (indicate cue type and reason): able to wash face easily. cues for problem solving and sequencing oral care needed Upper Body Bathing: Moderate assistance;Sitting Upper Body Bathing Details (indicate cue type and reason): assist for back and arms d/t pt difficulty problem solving with O2 probe on Lower Body Bathing: Moderate assistance;Sitting/lateral leans;Sit to/from stand Lower Body Bathing Details (indicate cue type and reason): assist for B feet and drying lower LE. assist for balance in standing while pt bathing peri region in standing- some unsteadiness noted Upper Body Dressing : Minimal assistance;Sitting   Lower Body Dressing: Maximal assistance;Sit to/from stand;Sitting/lateral leans Lower Body Dressing Details (indicate cue type and reason): to don socks. unable to don shoes due to BLE edema  Extremity/Trunk Assessment Upper Extremity Assessment Upper Extremity Assessment: Generalized weakness;Right hand dominant;RUE deficits/detail;LUE deficits/detail RUE Deficits / Details: edema and redness  noted around elbow RUE Coordination: decreased fine motor LUE Deficits / Details: edema and redness noted around elbow LUE Coordination: decreased fine motor   Lower Extremity Assessment Lower Extremity Assessment: Defer to PT evaluation        Vision   Vision Assessment?: No apparent visual deficits   Perception     Praxis     Communication Communication Communication: No apparent difficulties Factors Affecting Communication: Reduced clarity of speech   Cognition Arousal: Alert Behavior During Therapy: Flat affect Cognition: Cognition impaired   Orientation impairments: Situation Awareness: Online awareness impaired, Intellectual awareness impaired Memory impairment (select all impairments): Short-term memory, Working [employer] fast streamer, Engineer, structural memory Attention impairment (select first level of impairment): Sustained attention Executive functioning impairment (select all impairments): Initiation, Organization, Sequencing, Reasoning, Problem solving OT - Cognition Comments: alert and able to follow directions with increased time. cues for sequencing and safety needed                 Following commands: Impaired Following commands impaired: Follows one step commands with increased time, Follows multi-step commands inconsistently      Cueing   Cueing Techniques: Verbal cues, Gestural cues, Visual cues  Exercises      Shoulder Instructions       General Comments SpO2 95% on RA w/ activity    Pertinent Vitals/ Pain       Pain Assessment Pain Assessment: No/denies pain  Home Living                                          Prior Functioning/Environment              Frequency  Min 1X/week        Progress Toward Goals  OT Goals(current goals can now be found in the care plan section)  Progress towards OT goals: Progressing toward goals  Acute Rehab OT Goals Patient Stated Goal: have some good coffee OT Goal Formulation:  With patient Time For Goal Achievement: 06/13/23 Potential to Achieve Goals: Good ADL Goals Pt Will Perform Grooming: with min assist;standing Pt Will Perform Upper Body Bathing: with supervision;sitting Pt Will Perform Lower Body Bathing: with mod assist;sit to/from stand;sitting/lateral leans Pt Will Transfer to Toilet: with min assist;ambulating  Plan      Co-evaluation                 AM-PAC OT "6 Clicks" Daily Activity     Outcome Measure   Help from another person eating meals?: A Little Help from another person taking care of personal grooming?: A Little Help from another person toileting, which includes using toliet, bedpan, or urinal?: A Lot Help from another person bathing (including washing, rinsing, drying)?: A Lot Help from another person to put on and taking off regular upper body clothing?: A Little Help from another person to put on and taking off regular lower body clothing?: A Lot 6 Click Score: 15    End of Session Equipment Utilized During Treatment: Gait belt;Rolling walker (2 wheels)  OT Visit Diagnosis: Other abnormalities of gait and mobility (R26.89);Unsteadiness on feet (R26.81);Muscle weakness (generalized) (M62.81);Other symptoms and signs involving cognitive function   Activity Tolerance Patient tolerated treatment well   Patient Left in chair;with call bell/phone within reach;with chair alarm  set   Nurse Communication Mobility status        Time: 0721-0755 OT Time Calculation (min): 34 min  Charges: OT General Charges $OT Visit: 1 Visit OT Treatments $Self Care/Home Management : 23-37 mins  Bradd Canary, OTR/L Acute Rehab Services Office: [phone]   Lorre Munroe 06/07/2023, 8:00 AM

## 2023-06-07 NOTE — TOC Progression Note (Addendum)
 Transition of Care [facility]) - Progression Note    Patient Details  Name: Danny Patterson MRN: [id number] Date of Birth: [birth year]  [facility] CM/SW Contact  Mearl Latin, LCSW Phone Number: 06/07/2023, 1:09 PM  Clinical Narrative:    1:09 PM-Per [facility], case is under medical review.   CSW updated [employer].  CSW updated patient's daughter.      Barriers to Discharge: Insurance Authorization  Expected Discharge Plan and Services       Living arrangements for the past 2 months: Single Family Home Expected Discharge Date: 06/06/23                                     Social Determinants of Health (SDOH) Interventions SDOH Screenings   Food Insecurity: Patient Unable To Answer (05/27/2023)  Housing: Low Risk  (05/31/2023)  Transportation Needs: Patient Unable To Answer (05/27/2023)  Utilities: Patient Unable To Answer (05/27/2023)    Readmission Risk Interventions     No data to display

## 2023-06-07 NOTE — NC FL2 (Addendum)
 [place] MEDICAID FL2 LEVEL OF CARE FORM     IDENTIFICATION  [name] Birthdate: [birth year] Sex: male Admission Date (Current Location): 05/26/2023  [facility] and [place] Number:  Chiropodist and Address:  [facility], [address] [place], [place] [address]      Provider Number: [id number]  Attending Physician Name and Address:  Leroy Sea, MD  Relative Name and Phone Number:  Jojo Pehl [phone]    Current Level of Care: Hospital Recommended Level of Care: Skilled Nursing Facility Prior Approval Number:    Date Approved/Denied:   PASRR Number: [id number]  Discharge Plan: SNF    Current Diagnoses: Patient Active Problem List   Diagnosis Date Noted   Seizure (HCC) 05/26/2023    Orientation RESPIRATION BLADDER Height & Weight     Self, Place  O2 (1L nasal cannula) Incontinent, External catheter Weight: 234 lb 5.6 oz (106.3 kg) Height:  5\' 10"  (177.8 cm)  BEHAVIORAL SYMPTOMS/MOOD NEUROLOGICAL BOWEL NUTRITION STATUS      Continent Diet (see dc summary)  AMBULATORY STATUS COMMUNICATION OF NEEDS Skin   Limited Assist Verbally Normal                       Personal Care Assistance Level of Assistance  Bathing, Feeding, Dressing Bathing Assistance: Maximum assistance Feeding assistance: Limited assistance Dressing Assistance: Maximum assistance     Functional Limitations Info  Sight, Hearing, Speech Sight Info: Impaired Hearing Info: Impaired Speech Info: Impaired (slurred speech)    SPECIAL CARE FACTORS FREQUENCY  PT (By licensed PT), OT (By licensed OT)     PT Frequency: 5x per week OT Frequency: 5x per week            Contractures Contractures Info: Not present    Additional Factors Info  Code Status, Allergies Code Status Info: full code Allergies Info: hydrochodone intolerance           Current Medications (06/07/2023):  This is the current hospital active medication  list Current Facility-Administered Medications  Medication Dose Route Frequency Provider Last Rate Last Admin   acetaminophen (TYLENOL) tablet 650 mg  650 mg Oral Q6H PRN Regalado, Belkys A, MD   650 mg at 06/05/23 1712   Or   acetaminophen (TYLENOL) suppository 650 mg  650 mg Rectal Q6H PRN Regalado, Belkys A, MD       albuterol (PROVENTIL) (2.5 MG/3ML) 0.083% nebulizer solution 2.5 mg  2.5 mg Nebulization Q2H PRN Regalado, Belkys A, MD       allopurinol (ZYLOPRIM) tablet 300 mg  300 mg Oral Daily Regalado, Belkys A, MD   300 mg at 06/07/23 0851   aspirin EC tablet 81 mg  81 mg Oral Daily Rejeana Brock, MD   81 mg at 06/07/23 0851   carvedilol (COREG) tablet 3.125 mg  3.125 mg Oral BID WC Susa Raring K, MD   3.125 mg at 06/07/23 0851   divalproex (DEPAKOTE) DR tablet 750 mg  750 mg Oral Q12H Rejeana Brock, MD   750 mg at 06/07/23 8469   haloperidol lactate (HALDOL) injection 2 mg  2 mg Intramuscular Q6H PRN Leroy Sea, MD       heparin injection 5,000 Units  5,000 Units Subcutaneous Q8H Regalado, Belkys A, MD   5,000 Units at 06/07/23 0534   hydrALAZINE (APRESOLINE) injection 5 mg  5 mg Intravenous Q6H PRN Regalado, Belkys A, MD   5 mg at 05/28/23  1954   multivitamin with minerals tablet 1 tablet  1 tablet Oral Daily Regalado, Belkys A, MD   1 tablet at 06/07/23 0851   ondansetron (ZOFRAN) tablet 4 mg  4 mg Oral Q6H PRN Regalado, Belkys A, MD       Or   ondansetron (ZOFRAN) injection 4 mg  4 mg Intravenous Q6H PRN Regalado, Belkys A, MD       QUEtiapine (SEROQUEL) tablet 50 mg  50 mg Oral QHS Leroy Sea, MD   50 mg at 06/06/23 2056     Discharge Medications: Please see discharge summary for a list of discharge medications.  Relevant Imaging Results:  Relevant Lab Results:   Additional Information SSn: [address] [place], LCSW

## 2023-06-07 NOTE — Plan of Care (Signed)
  Problem: Education: Goal: Knowledge of General Education information will improve Description: Including pain rating scale, medication(s)/side effects and non-pharmacologic comfort measures Outcome: Progressing  Education provided to daughter

## 2023-06-07 NOTE — Progress Notes (Signed)
 [facility]                                                                                                                                                                         Patient Demographics  Danny Patterson, is a 78 y.o. male  KGM:[id number]  UYQ:[id number]  DOB - 28-Jun-1945  Admit date - 05/26/2023  Admitting Physician Osvaldo Shipper, MD  Outpatient Primary MD for the patient is Smitty Cords, DO  LOS - 11   Chief Complaint  Patient presents with   Code Stroke        Assessment & Plan    Patient seen briefly today due for discharge soon per Discharge done yesterday by  me, no further issues, Vital signs stable, patient feels fine.  Await SNF bed.   Medications  Scheduled Meds:  allopurinol  300 mg Oral Daily   aspirin EC  81 mg Oral Daily   carvedilol  3.125 mg Oral BID WC   divalproex  750 mg Oral Q12H   heparin  5,000 Units Subcutaneous Q8H   multivitamin with minerals  1 tablet Oral Daily   QUEtiapine  50 mg Oral QHS   Continuous Infusions: PRN Meds:.acetaminophen **OR** acetaminophen, albuterol, haloperidol lactate, hydrALAZINE, ondansetron **OR** ondansetron (ZOFRAN) IV    Time Spent in minutes   10 minutes   Susa Raring M.D on 06/07/2023 at 9:46 AM  Between 7am to 7pm - Pager - [phone]  After 7pm go to [email or web address] - password [email or web address]  And look for the night coverage person covering for me after hours  [facility] Office  [phone]    Subjective:   Danny Patterson today has, No headache, No chest pain, No abdominal pain - No Nausea, No new weakness tingling or numbness, No Cough - SOB.    Objective:   Vitals:   06/06/23 1942 06/07/23 0010 06/07/23 0331 06/07/23 0855  BP: (!) 151/80 121/64 (!) 146/79 (!) 159/77  Pulse:  70  61  Resp:  19 15 10   Temp: 98.4 F (36.9 C) 97.8 F (36.6 C) 97.9 F (36.6 C)   TempSrc: Oral Oral Axillary    SpO2:  96% 98% 96%  Weight:      Height:        Wt Readings from Last 3 Encounters:  05/30/23 106.3 kg     Intake/Output Summary (Last 24 hours) at 06/07/2023 0946 Last data filed at 06/06/2023 1600 Gross per 24 hour  Intake --  Output 800 ml  Net -800 ml    Exam  Awake Alert, No new F.N deficits, Normal affect [place].AT,PERRAL Supple Neck, No JVD,   Symmetrical Chest wall movement, Good air movement bilaterally, CTAB RRR,No Gallops, Rubs or new Murmurs,  +  ve B.Sounds, Abd Soft, No tenderness,   No Cyanosis, Clubbing or edema   Data Review

## 2023-06-07 NOTE — Progress Notes (Signed)
 This patient was a source of a blood borne pathogen exposure. Labs were collected from patient, patient's family notified of the situation. Proper paper work done by Barrister's clerk.

## 2023-06-08 DIAGNOSIS — R569 Unspecified convulsions: Secondary | ICD-10-CM | POA: Diagnosis not present

## 2023-06-08 MED ORDER — DOCUSATE SODIUM 100 MG PO CAPS
100.0000 mg | ORAL_CAPSULE | Freq: Two times a day (BID) | ORAL | Status: DC
  Administered 2023-06-08: 100 mg via ORAL
  Filled 2023-06-08 (×2): qty 1

## 2023-06-08 MED ORDER — POLYETHYLENE GLYCOL 3350 17 G PO PACK: 17.0000 g | PACK | Freq: Every day | ORAL | Status: DC | PRN

## 2023-06-08 NOTE — TOC Progression Note (Signed)
 Transition of Care [facility]) - Progression Note    Patient Details  Name: Danny Patterson MRN: [id number] Date of Birth: 1945/08/26  [facility]) CM/SW Contact  Mearl Latin, LCSW Phone Number: 06/08/2023, 2:00 PM  Clinical Narrative:    CSW spoke with [employer] who stated they can accept patient once off Haldol for 24 hours. CSW left voicemail for patient's daughter, Danny Patterson. Hopeful for discharge tomorrow.   Therapist, occupational received, Auth # [id number], Theodoro Grist # [phone].   Expected Discharge Plan: Skilled Nursing Facility Barriers to Discharge: Other (must enter comment) (24 hours after haldol per snf)  Expected Discharge Plan and Services In-house Referral: Clinical Social Work   Post Acute Care Choice: Skilled Nursing Facility Living arrangements for the past 2 months: Single Family Home Expected Discharge Date: 06/06/23                                     Social Determinants of Health (SDOH) Interventions SDOH Screenings   Food Insecurity: Patient Unable To Answer (05/27/2023)  Housing: Low Risk  (05/31/2023)  Transportation Needs: Patient Unable To Answer (05/27/2023)  Utilities: Patient Unable To Answer (05/27/2023)    Readmission Risk Interventions     No data to display

## 2023-06-08 NOTE — Plan of Care (Signed)
   Problem: Education: Goal: Knowledge of General Education information will improve Description: Including pain rating scale, medication(s)/side effects and non-pharmacologic comfort measures Outcome: Not Progressing   Problem: Health Behavior/Discharge Planning: Goal: Ability to manage health-related needs will improve Outcome: Not Progressing

## 2023-06-08 NOTE — Progress Notes (Signed)
 [facility] PROGRESS NOTE   Danny Patterson ZOX:[id number] DOB: [birth year] DOA: 05/26/2023  PCP: Smitty Cords, DO  Brief History:    78 y.o. male with medical history significant of HTN, stable BPH, could not tolerate Flomax, gout, lumbar fusion, history of kidney stone presents from home with syncopal episode.  He was found to have left-sided gaze preference and left arm and leg weakness.  Presented as a code stroke.  Apparently also had a shaking episode.  Seen by neurology.  Concern was for seizure activity.  He was hospitalized for further management.   Consultants: Neurology  Procedures:  Continuous EEG shows nonspecific encephalopathy.  No active seizures. No ultrasound.  Nonacute.   Subjective:  In bed, denies any complaints, asking when he can go to SNF facility, no significant events overnight as discussed with staff  Assessment/Plan:   Seizure/acute metabolic encephalopathy    Thought to have seizure activity.  Did have left-sided symptoms.  Initially thought to be stroke.  CT head and MRI brain stable, initially was on Keppra but due to encephalopathy switched to Depakote with good effect.  Echocardiogram nonacute as well.   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   It is possible that patient has old right parietal injury and due to times of stress i.e. lack of blood supply, hypoperfusion, hypotension, hypoglycemia, fever, infection he has excitable focus giving him focal seizure.  Most of his seizures affect his left arm.  He has been adequately hydrated, placed on midodrine, TED stockings, overall much better.  Now at baseline, EEG was negative, seen and cleared by neuro.  Will need SNF.     Orthostatic hypotension.  Resolved after IV fluids, TED stockings and  low-dose midodrine, tapered off midodrine and monitor.  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  He is stable here to initial IV fluids for hydration.  Normocytic anemia  No evidence of overt bleeding.  Continue to monitor.  Essential hypertension - blood pressure is improved have placed him on low-dose Coreg will monitor.  Hyperlipidemia - Continue with statin.  Hypokalemia and hypomagnesemia.  Replaced.    History of gout   Continue with allopurinol.  Colchicine on hold.  Obesity Estimated body mass index is 33.63 kg/m as calculated from the following:   Height as of this encounter: 5\' 10"  (1.778 m).   Weight as of this encounter: 106.3 kg.  DVT Prophylaxis: Subcutaneous heparin Code Status: Full code Family Communication:  none at ebdside  Status is: Inpatient Disposition : patient is medically stable for discharge when SNF bed is available  Medications: Scheduled:  allopurinol  300 mg Oral Daily   aspirin EC  81 mg Oral Daily   carvedilol  3.125 mg Oral BID WC   divalproex  750 mg Oral Q12H   heparin  5,000 Units Subcutaneous Q8H   multivitamin with minerals  1 tablet Oral Daily   QUEtiapine  50 mg Oral QHS   Continuous:  WJX:BJYNWGNFAOZHY **OR** acetaminophen, albuterol, haloperidol lactate, hydrALAZINE, ondansetron **OR** ondansetron (ZOFRAN) IV   Objective:  Vital Signs  Vitals:   06/07/23 2333 06/08/23 0357 06/08/23 0800 06/08/23 1116  BP: 136/71  Marland Kitchen)  160/77 (!) 143/63  Pulse:   63 (!) 59  Resp: 15  20 19   Temp: 98.8 F (37.1 C) 97.9 F (36.6 C) 97.7 F (36.5 C) 98 F (36.7 C)  TempSrc: Oral Axillary Oral Oral  SpO2:   97% 100%  Weight:      Height:        Intake/Output Summary (Last 24 hours) at 06/08/2023 1306 Last data filed at 06/08/2023 0300 Gross per 24 hour  Intake --  Output 600 ml  Net -600 ml     Filed Weights   05/26/23 1608 05/27/23 0455 05/30/23 2346  Weight: 107.5 kg 111.9 kg 106.3 kg    Exam  Awake minimally confused, No new F.N deficits, Normal affect Symmetrical Chest wall movement, Good air movement bilaterally, CTAB RRR,No Gallops,Rubs or new Murmurs, No Parasternal Heave +ve B.Sounds, Abd Soft, No tenderness, No rebound - guarding or rigidity. No Cyanosis, Clubbing or edema, No new Rash or bruise     Lab Results:  Data Reviewed: I have personally reviewed following labs and reports of the imaging studies  CBC: Recent Labs  Lab 06/02/23 0519  WBC 6.4  NEUTROABS 3.3  HGB 11.6*  HCT 33.4*  MCV 91.0  PLT 162    Basic Metabolic Panel: Recent Labs  Lab 06/02/23 0519 06/05/23 0556  NA 137 140  K 4.3 3.9  CL 104 106  CO2 24 26  GLUCOSE 81 95  BUN 32* 38*  CREATININE 1.25* 1.32*  CALCIUM 8.8* 8.9  MG 1.9 1.9  PHOS 3.0  --     GFR: Estimated Creatinine Clearance: 56.3 mL/min (A) (by C-G formula based on SCr of 1.32 mg/dL (H)).  Liver Function Tests: Recent Labs  Lab 06/02/23 0519 06/04/23 1512  AST 29 29  ALT 19 20  ALKPHOS 52 61  BILITOT 0.6 0.7  PROT 5.1* 5.4*  ALBUMIN 2.7* 2.6*     Coagulation Profile: No results for input(s): "INR", "PROTIME" in the last 168 hours.   CBG: Recent Labs  Lab 06/05/23 1211 06/05/23 1526 06/06/23 0848 06/06/23 1305 06/06/23 1734  GLUCAP 76 89 79 91 90     Radiology Studies: No results found.    LOS: 12 days   Signature  Huey Bienenstock M.D on 06/08/2023 at 1:06 PM   -  To page go to [email or web address]

## 2023-06-08 NOTE — Progress Notes (Signed)
 Physical Therapy Treatment Patient Details Name: Danny Patterson MRN: [id number] DOB: 02-Nov-1945 Today's Date: 06/08/2023   History of Present Illness Pt is a 78 y.o. M who presents with left sided gaze preference and weakness. Presented as code stroke on 05/26/23. 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 Pt thought to have had seizure activity with L deficits, acute metabolic encephalopathy, orthostatic hypotension, AKI.  3/11 episode of decr responsiveness; head CT negative. Significant PMH: HTN, stable BPH, gout, lumbar fusion, history of kidney stone.    PT Comments  Pt continues to make excellent progress. Daughter present for session and please with progress - reports plan is SNF tomorrow but hopeful for short stay.  Pt ambulated 140' with RW and light min A.  Does need repeated cues for transfer techniques but improved with practice.  Pt is fall risk at this time.  Cont POC.  Patient will benefit from continued inpatient follow up therapy, <3 hours/day at d/c.     If plan is discharge home, recommend the following: Assistance with cooking/housework;Assist for transportation;Help with stairs or ramp for entrance;Supervision due to cognitive status;A little help with walking and/or transfers;A little help with bathing/dressing/bathroom   Can travel by private vehicle     Yes  Equipment Recommendations  BSC/3in1    Recommendations for Other Services       Precautions / Restrictions Precautions Precautions: Fall Recall of Precautions/Restrictions: Impaired     Mobility  Bed Mobility Overal bed mobility: Needs Assistance Bed Mobility: Supine to Sit     Supine to sit: Min assist, Used rails     General bed mobility comments: Light min A to scoot forward    Transfers Overall transfer level: Needs assistance Equipment used: Rolling walker (2 wheels) Transfers: Sit to/from Stand Sit to Stand: Min assist, Contact guard assist           General  transfer comment: STS from bed with min A and cues for hand placement.  Worked on 5 x STS from recliner - repeated cues but progressed from min A to CGA    Ambulation/Gait Ambulation/Gait assistance: Editor, commissioning (Feet): 140 Feet Assistive device: Rolling walker (2 wheels) Gait Pattern/deviations: Step-through pattern, Trunk flexed Gait velocity: decreased     General Gait Details: Had chair follow for safety but pt tolerated well (likely could do without chair follow in future).  Cues to stay close to RW and stand upright as able.  Light min A to steady and guide RW in tight areas.   Stairs             Wheelchair Mobility     Tilt Bed    Modified Rankin (Stroke Patients Only)       Balance Overall balance assessment: Needs assistance Sitting-balance support: Feet supported Sitting balance-Leahy Scale: Fair Sitting balance - Comments: supervison   Standing balance support: Bilateral upper extremity supported Standing balance-Leahy Scale: Poor Standing balance comment: UE support for balance                            Communication Communication Factors Affecting Communication: Hearing impaired  Cognition Arousal: Alert Behavior During Therapy: WFL for tasks assessed/performed   PT - Cognitive impairments: Sequencing, Problem solving, Memory                       PT - Cognition Comments: Pleasant and follows commands; slow processing; repeated cues  for transfer techniques        Cueing    Exercises Other Exercises Other Exercises: 5 x STS    General Comments General comments (skin integrity, edema, etc.): VSS on RA      Pertinent Vitals/Pain Pain Assessment Pain Assessment: No/denies pain    Home Living                          Prior Function            PT Goals (current goals can now be found in the care plan section) Progress towards PT goals: Progressing toward goals    Frequency    Min  2X/week      PT Plan      Co-evaluation              AM-PAC PT "6 Clicks" Mobility   Outcome Measure  Help needed turning from your back to your side while in a flat bed without using bedrails?: A Little Help needed moving from lying on your back to sitting on the side of a flat bed without using bedrails?: A Lot (a little with rails) Help needed moving to and from a bed to a chair (including a wheelchair)?: A Little Help needed standing up from a chair using your arms (e.g., wheelchair or bedside chair)?: A Lot (mod cues) Help needed to walk in hospital room?: A Lot (mod cues) Help needed climbing 3-5 steps with a railing? : Total 6 Click Score: 13    End of Session Equipment Utilized During Treatment: Gait belt Activity Tolerance: Patient tolerated treatment well Patient left: in chair;with call bell/phone within reach;with family/visitor present;with chair alarm set Nurse Communication: Mobility status PT Visit Diagnosis: Unsteadiness on feet (R26.81);History of falling (Z91.81);Difficulty in walking, not elsewhere classified (R26.2)     Time: 7829-5621 PT Time Calculation (min) (ACUTE ONLY): 27 min  Charges:    $Gait Training: 8-22 mins $Therapeutic Activity: 8-22 mins PT General Charges $$ ACUTE PT VISIT: 1 Visit                     Anise Salvo, PT Acute Rehab [facility] [phone]    Danny Patterson 06/08/2023, 4:53 PM

## 2023-06-09 ENCOUNTER — Inpatient Hospital Stay (HOSPITAL_COMMUNITY)

## 2023-06-09 DIAGNOSIS — R569 Unspecified convulsions: Secondary | ICD-10-CM | POA: Diagnosis not present

## 2023-06-09 DIAGNOSIS — I619 Nontraumatic intracerebral hemorrhage, unspecified: Secondary | ICD-10-CM | POA: Diagnosis not present

## 2023-06-09 DIAGNOSIS — I6200 Nontraumatic subdural hemorrhage, unspecified: Secondary | ICD-10-CM | POA: Diagnosis not present

## 2023-06-09 LAB — GLUCOSE, CAPILLARY: Glucose-Capillary: 163 mg/dL — ABNORMAL HIGH (ref 70–99)

## 2023-06-09 MED ORDER — GLYCOPYRROLATE 0.2 MG/ML IJ SOLN
0.2000 mg | INTRAMUSCULAR | Status: DC | PRN
  Administered 2023-06-09: 0.2 mg via INTRAVENOUS
  Filled 2023-06-09 (×2): qty 1

## 2023-06-09 MED ORDER — MORPHINE SULFATE (PF) 2 MG/ML IV SOLN
1.0000 mg | INTRAVENOUS | Status: DC | PRN
  Administered 2023-06-10: 2 mg via INTRAVENOUS
  Filled 2023-06-09: qty 1

## 2023-06-09 MED ORDER — LORAZEPAM 2 MG/ML PO CONC: 1.0000 mg | ORAL | Status: DC | PRN

## 2023-06-09 MED ORDER — GLYCOPYRROLATE 1 MG PO TABS: 1.0000 mg | ORAL_TABLET | ORAL | Status: DC | PRN

## 2023-06-09 MED ORDER — HALOPERIDOL 0.5 MG PO TABS: 0.5000 mg | ORAL_TABLET | ORAL | Status: DC | PRN

## 2023-06-09 MED ORDER — HALOPERIDOL LACTATE 2 MG/ML PO CONC: 0.5000 mg | ORAL | Status: DC | PRN

## 2023-06-09 MED ORDER — ONDANSETRON 4 MG PO TBDP: 4.0000 mg | ORAL_TABLET | Freq: Four times a day (QID) | ORAL | Status: DC | PRN

## 2023-06-09 MED ORDER — ONDANSETRON HCL 4 MG/2ML IJ SOLN: 4.0000 mg | Freq: Four times a day (QID) | INTRAMUSCULAR | Status: DC | PRN

## 2023-06-09 MED ORDER — GLYCOPYRROLATE 0.2 MG/ML IJ SOLN: 0.2000 mg | INTRAMUSCULAR | Status: DC | PRN

## 2023-06-09 MED ORDER — LORAZEPAM 1 MG PO TABS: 1.0000 mg | ORAL_TABLET | ORAL | Status: DC | PRN

## 2023-06-09 MED ORDER — LORAZEPAM 2 MG/ML IJ SOLN: 1.0000 mg | INTRAMUSCULAR | Status: DC | PRN

## 2023-06-09 MED ORDER — HALOPERIDOL LACTATE 5 MG/ML IJ SOLN: 0.5000 mg | INTRAMUSCULAR | Status: DC | PRN

## 2023-06-09 MED ORDER — POLYVINYL ALCOHOL 1.4 % OP SOLN: 1.0000 [drp] | Freq: Four times a day (QID) | OPHTHALMIC | Status: DC | PRN

## 2023-06-09 NOTE — Progress Notes (Signed)
 Patient return to room after having CT done. Dr. Bea Laura and neurologist in room who spoke family at bedside with plans forward.

## 2023-06-09 NOTE — Progress Notes (Signed)
 RRT RN to room, Dr Randol Kern at bedside, family at bedside. ABG, CT stat and lab work ordered. Patient vitals remain stable. All efforts futile to arouse patient.

## 2023-06-09 NOTE — Progress Notes (Signed)
 Interval history:  At approximately 10:15 AM, patient was noted to have decreased responsiveness.  He would move his head a little bit to sternal rub but made no purposeful movements.  Snoring respirations were noted by primary team, and patient was brought for stat head CT.  Examination  Vital signs in last 24 hours: Temp:  [97.4 F (36.3 C)-98.3 F (36.8 C)] 98.3 F (36.8 C) (03/20 1011) Pulse Rate:  [59-77] 75 (03/20 1011) Resp:  [15-20] 15 (03/20 1011) BP: (126-168)/(51-74) 132/69 (03/20 1011) SpO2:  [92 %-100 %] 93 % (03/20 1011)  General:sitting up in bed, NAD  Neuro: Patient is somnolent, rests with eyes closed with snoring respirations.  No movement of extremities.  Left pupil sluggishly reactive, right pupil nonreactive.  With vigorous sternal rub, patient moves his head slightly, but no other responses noted.   Basic Metabolic Panel: Recent Labs  Lab 06/05/23 0556  NA 140  K 3.9  CL 106  CO2 26  GLUCOSE 95  BUN 38*  CREATININE 1.32*  CALCIUM 8.9  MG 1.9    CBC: No results for input(s): "WBC", "NEUTROABS", "HGB", "HCT", "MCV", "PLT" in the last 168 hours.    Coagulation Studies: No results for input(s): "LABPROT", "INR" in the last 72 hours.  CT head 3/20: Very large right-sided subdural hemorrhage with intra-axial bleeding as well as IVH.  Midline shift up to 26 mm, possible associated right ACA infarct  ASSESSMENT: 78 y.o. male who presented with new onset focal status epilepticus, improved with multiple AEDs. MRI Brain showed no acute abnormalities, but consistent with CAA, and did have an old right parietal infarct. EEG showed no seizures. Neuro exams since initial assessment had been stable. His ammonia was mildly elevated two days ago at 36 but we rechecked it today and it was normal, as was his LFT panel. His VPA level is currently therapeutic at 87. On 3/20, patient was noted to have diminished responsiveness on evaluation by primary team.  His family had  arrived around 7 AM, and had not stimulated patient as he appeared to be sleeping.  Last known normal time is unclear, likely sometime last night.  Patient was noted to respond minimally only to vigorous sternal rub on exam.  He was taken for stat head CT which revealed a massive right-sided subdural hematoma with intra-axial bleeding and IVH as well as 26 mm right to left midline shift and a possible ACA infarct.  - Exam reveals an unresponsive patient with sonorous respirations, a sluggishly reactive 5 mm left pupil and an irregular, nonreactive 5 mm right pupil.  - Prognosis for a meaningful neurological outcome if he survives the SDH is noted to be quite poor, and the SDH is likely nonsurvivable in any case due to the severity of the mass effect and midline shift. If patient did survive, he would likely have significant disability and may never regain consciousness.  - CT findings and prognosis were discussed with family, who opted to transition patient to comfort care.   Recommendations -Transition patient to comfort care per family wishes and patient's earlier expressed wishes -Neurology will sign off  Patient seen by NP and then by MD, MD to edit note as needed. Cortney E Ernestina Columbia , MSN, AGACNP-BC [facility] See Amion for schedule and pager information 06/09/2023 11:02 AM   Electronically signed: Dr. Caryl Pina

## 2023-06-09 NOTE — TOC Progression Note (Signed)
 Transition of Care [facility]) - Progression Note    Patient Details  Name: Adonte Vanriper MRN: [id number] Date of Birth: Aug 22, 1945  [facility] CM/SW Contact  Mearl Latin, LCSW Phone Number: 06/09/2023, 4:57 PM  Clinical Narrative:    10:30am-CSW received notification from [facility] that they have rescinded their bed offer due to patient's agitation incident.  CSW left voicemail for patient's daughter.  10:45am-CSW spoke with MD who reported patient with new medical event.    Expected Discharge Plan: Skilled Nursing Facility Barriers to Discharge: Other (must enter comment) (24 hours after haldol per snf)  Expected Discharge Plan and Services In-house Referral: Clinical Social Work   Post Acute Care Choice: Skilled Nursing Facility Living arrangements for the past 2 months: Single Family Home Expected Discharge Date: 06/06/23                                     Social Determinants of Health (SDOH) Interventions SDOH Screenings   Food Insecurity: Patient Unable To Answer (05/27/2023)  Housing: Low Risk  (05/31/2023)  Transportation Needs: Patient Unable To Answer (05/27/2023)  Utilities: Patient Unable To Answer (05/27/2023)    Readmission Risk Interventions     No data to display

## 2023-06-09 NOTE — Progress Notes (Signed)
 Patient noted to be asleep with all efforts to wake patient unsuccessful. Son at bedside voiced that he was just allowing patient to sleep. However when this RN tried to wake patient up for his medicine he was unarousable. Dr. Bea Laura notified and CN made aware.

## 2023-06-09 NOTE — Progress Notes (Signed)
 Patient resting comfortable in bed. Family at bedside. Oral care done.

## 2023-06-09 NOTE — Significant Event (Addendum)
 Rapid Response Event Note   Reason for Call :  Obtunded  Initial Focused Assessment:  Pt lying in bed, minimal response to noxious stimuli. Pupils midline, non-reactive to light, irregular in shape. Skin hot, moist. Mucous membranes dry.   VS: T 98.33F, BP 132/69, HR 76, RR 15, SpO2 93% on 3LNC  Interventions:  -Stat CT head completed, per MD orders  Plan of Care:  Comfort measures  Event Summary:  MD Notified: Dr. Randol Kern at bedside  Call Time: 1006 Arrival Time: 1015 End Time: 1115  Jennye Moccasin, RN

## 2023-06-09 NOTE — Progress Notes (Signed)
 [facility] PROGRESS NOTE   Danny Patterson ZOX:[id number] DOB: July 15, 1945 DOA: 05/26/2023  PCP: Smitty Cords, DO  Brief History:    78 y.o. male with medical history significant of HTN, stable BPH, could not tolerate Flomax, gout, lumbar fusion, history of kidney stone presents from home with syncopal episode.  He was found to have left-sided gaze preference and left arm and leg weakness.  Presented as a code stroke.  Apparently also had a shaking episode.  Seen by neurology.  No evidence of stroke on imaging, concern was for seizure activity.  Most likely in the setting of hypotension, he was hospitalized for further management.  He was seen by neurology, started on AED, he was stabilized, as well blood pressure has been stabilized, and plan for SNF placement, he was awaiting SNF bed availability, on the morning of 06/09/2023, he was noted very difficult to arouse, upon exam pupils were fixed and dilated bilaterally, stat CT head was obtained, significant for severe acute intracranial hemorrhage, with IVH, severe mass effect with left line midline shift, neurology input greatly appreciated, there is no meaningful recovery anticipated, unsurvivable without significant life quality to duration, upon discussion with the family plan was to proceed with full comfort measures as patient would have never wanted to be on life support or have life with poor neurological function.  Consultants: Neurology    Subjective:  This morning patient is difficult to arouse, please see above discussion.  .   Assessment/Plan:   ICH, subdural hematoma, mass effect with midline shift . -Please see above discussion -Plan to proceed with comfort measures  Seizure/acute metabolic encephalopathy    Thought to have seizure activity.  Did have left-sided symptoms.  Initially thought to be stroke.  CT head and MRI brain stable, initially was on Keppra but due to encephalopathy switched to Depakote with  good effect.  Echocardiogram nonacute as well.   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   It is possible that patient has old right parietal injury and due to times of stress i.e. lack of blood supply, hypoperfusion, hypotension, hypoglycemia, fever, infection he has excitable focus giving him focal seizure.  Most of his seizures affect his left arm.  He has been adequately hydrated, placed on midodrine, TED stockings, overall much better.  Now at baseline, EEG was negative, seen  by neuro.      Orthostatic hypotension.  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    Normocytic anemia  No evidence of overt bleeding.    Essential hypertension - blood pressure is improved have placed him on low-dose Coreg, slight elevated blood pressure readings to avoid orthostatic hypotension which has contributed to his seizures.  Hyperlipidemia -  Hypokalemia and hypomagnesemia.  Replaced.    History of gout     Obesity Estimated body mass index is 33.63 kg/m as calculated from the following:   Height as of this encounter: 5\' 10"  (1.778 m).   Weight as of this encounter: 106.3 kg.  DVT Prophylaxis: Comfort Code Status: Comfort Family Communication: Son and daughter at bedside  Status  is: Inpatient Disposition : patient is medically stable for discharge when SNF bed is available  Medications: Scheduled:   Continuous:  NFA:[id number], glycopyrrolate **OR** glycopyrrolate **OR** glycopyrrolate, haloperidol **OR** haloperidol **OR** haloperidol lactate, LORazepam **OR**  [DISCONTINUED] LORazepam **OR** LORazepam, morphine injection, ondansetron **OR** ondansetron (ZOFRAN) IV, polyvinyl alcohol   Objective:  Vital Signs  Vitals:   06/09/23 0406 06/09/23 0800 06/09/23 0921 06/09/23 1011  BP: (!) 152/51 126/73 128/65 132/69  Pulse:  71 77 75  Resp:  17 19 15   Temp: 97.7 F (36.5 C)  98 F (36.7 C) 98.3 F (36.8 C)  TempSrc: Oral  Oral Oral  SpO2:  92% 93% 93%  Weight:      Height:        Intake/Output Summary (Last 24 hours) at 06/09/2023 1150 Last data filed at 06/08/2023 2202 Gross per 24 hour  Intake 240 ml  Output 700 ml  Net -460 ml    Filed Weights   05/26/23 1608 05/27/23 0455 05/30/23 2346  Weight: 107.5 kg 111.9 kg 106.3 kg    Exam  Patient appears comfortable, does not open eyes or answer any questions or follow any commands or any response to sternal rub Good air entry bilaterally Regular rate and rhythm Abdomen soft Extremities with no edema   Lab Results:  Data Reviewed: I have personally reviewed following labs and reports of the imaging studies  CBC: No results for input(s): "WBC", "NEUTROABS", "HGB", "HCT", "MCV", "PLT" in the last 168 hours.   Basic Metabolic Panel: Recent Labs  Lab 06/05/23 0556  NA 140  K 3.9  CL 106  CO2 26  GLUCOSE 95  BUN 38*  CREATININE 1.32*  CALCIUM 8.9  MG 1.9    GFR: Estimated Creatinine Clearance: 56.3 mL/min (A) (by C-G formula based on SCr of 1.32 mg/dL (H)).  Liver Function Tests: Recent Labs  Lab 06/04/23 1512  AST 29  ALT 20  ALKPHOS 61  BILITOT 0.7  PROT 5.4*  ALBUMIN 2.6*     Coagulation Profile: No results for input(s): "INR", "PROTIME" in the last 168 hours.   CBG: Recent Labs  Lab 06/05/23 1526 06/06/23 0848 06/06/23 1305 06/06/23 1734 06/09/23 1012  GLUCAP 89 79 91 90 163*     Radiology Studies: CT HEAD WO CONTRAST ( ) Result Date: 06/09/2023 CLINICAL DATA:  78 year old male is unresponsive. Suspected amyloid angiopathy on MRI.  EXAM: CT HEAD WITHOUT CONTRAST TECHNIQUE: Contiguous axial images were obtained from the base of the skull through the vertex without intravenous contrast. RADIATION DOSE REDUCTION: This exam was performed according to the departmental dose-optimization program which includes automated exposure control, adjustment of the mA and/or kV according to patient size and/or use of iterative reconstruction technique. COMPARISON:  CT head and CTA head and neck 05/31/2023. FINDINGS: Brain: Large volume new intracranial hemorrhage in the right hemisphere. This appears to be a combination of intra-axial bleeding (coronal image 35 posterior right temporal lobe) with extension into the right subdural space (same image). Large hyperdense subdural hematoma volume, as much as 3 cm in thickness and most pronounced along the anterior superior frontal convexity. Small volume right para falcine and tentorial subdural blood. The right temporal lobe intra-axial blood products are estimated to encompass an area of about 4 cm diameter. Additional smaller 1.5 cm area of inferior frontal pole intra-axial hemorrhage. Severe intracranial mass effect. Subfalcine herniation with leftward midline shift of 26 mm. Uncal herniation. Trapped left lateral ventricle, especially the temporal horn. Trace left occipital horn IVH.  Effaced suprasellar, interpeduncular cisterns. Other basilar cisterns remain patent. Hypodensity in the right ACA territory suspicious for subfalcine associated ACA infarct. Edema in the right temporal lobe could be ischemia or related to the large intra-axial hematoma there. No other cortically based infarct identified and elsewhere relatively maintained gray-white differentiation. Vascular: Calcified atherosclerosis at the skull base. No suspicious intracranial vascular hyperdensity. Skull: Stable and intact. Sinuses/Orbits: Visualized paranasal sinuses and mastoids are stable and well aerated. Other: No acute orbit or scalp soft  tissue finding. IMPRESSION: 1. Very Severe Acute Intracranial Hemorrhage with a combination of Intra-axial (right temporal lobe, inferior frontal gyrus) and Large Right Side Subdural Hemorrhage. SDH nearly 3 cm in thickness. Trace IVH. 2. Severe intracranial mass effect with subfalcine herniation, leftward midline shift up to 26 mm. 3. Possible associated Right ACA infarct. Confluent right temporal lobe edema probably secondary to #1. Critical Value/emergent results were called by telephone at the time of interpretation on 06/09/2023 at 10:47 am to Dr. Huey Bienenstock , who verbally acknowledged these results. Electronically Signed   By: Odessa Fleming M.D.   On: 06/09/2023 10:48      LOS: 13 days   Signature  -    Huey Bienenstock M.D on 06/09/2023 at 11:50 AM   -  To page go to [email or web address]

## 2023-06-09 NOTE — Plan of Care (Signed)
  Problem: Education: Goal: Knowledge of General Education information will improve Description: Including pain rating scale, medication(s)/side effects and non-pharmacologic comfort measures Outcome: Progressing   Problem: Coping: Goal: Level of anxiety will decrease Outcome: Progressing   Problem: Elimination: Goal: Will not experience complications related to bowel motility Outcome: Progressing Goal: Will not experience complications related to urinary retention Outcome: Progressing   Problem: Pain Managment: Goal: General experience of comfort will improve and/or be controlled Outcome: Progressing   Problem: Safety: Goal: Ability to remain free from injury will improve Outcome: Progressing

## 2023-06-09 NOTE — Plan of Care (Signed)
?  Problem: Safety: ?Goal: Ability to remain free from injury will improve ?Outcome: Progressing ?  ?Problem: Skin Integrity: ?Goal: Risk for impaired skin integrity will decrease ?Outcome: Progressing ?  ?Problem: Safety: ?Goal: Non-violent Restraint(s) ?Outcome: Progressing ?  ?

## 2023-06-09 NOTE — Progress Notes (Signed)
 Patient left for CT with RRT RN X 2.

## 2023-06-10 DIAGNOSIS — N179 Acute kidney failure, unspecified: Secondary | ICD-10-CM | POA: Insufficient documentation

## 2023-06-10 DIAGNOSIS — Z8673 Personal history of transient ischemic attack (TIA), and cerebral infarction without residual deficits: Secondary | ICD-10-CM

## 2023-06-10 DIAGNOSIS — R9089 Other abnormal findings on diagnostic imaging of central nervous system: Secondary | ICD-10-CM | POA: Insufficient documentation

## 2023-06-10 DIAGNOSIS — G9341 Metabolic encephalopathy: Secondary | ICD-10-CM | POA: Insufficient documentation

## 2023-06-10 DIAGNOSIS — I680 Cerebral amyloid angiopathy: Secondary | ICD-10-CM | POA: Diagnosis not present

## 2023-06-10 DIAGNOSIS — R569 Unspecified convulsions: Secondary | ICD-10-CM | POA: Diagnosis not present

## 2023-06-10 DIAGNOSIS — Z515 Encounter for palliative care: Secondary | ICD-10-CM

## 2023-06-10 DIAGNOSIS — I629 Nontraumatic intracranial hemorrhage, unspecified: Secondary | ICD-10-CM | POA: Diagnosis not present

## 2023-06-10 DIAGNOSIS — I951 Orthostatic hypotension: Secondary | ICD-10-CM | POA: Insufficient documentation

## 2023-06-10 MED ORDER — MORPHINE 100MG IN NS 100ML (1MG/ML) PREMIX INFUSION
1.0000 mg/h | INTRAVENOUS | Status: DC
Start: 2023-06-10 — End: 2023-06-10
  Administered 2023-06-10: 1 mg/h via INTRAVENOUS
  Filled 2023-06-10: qty 100

## 2023-06-10 MED ORDER — MORPHINE SULFATE (PF) 2 MG/ML IV SOLN: 1.0000 mg | INTRAVENOUS | Status: DC | PRN

## 2023-06-10 MED ORDER — LORAZEPAM 1 MG PO TABS: 1.0000 mg | ORAL_TABLET | ORAL | Status: DC | PRN

## 2023-06-10 MED ORDER — GLYCOPYRROLATE 1 MG PO TABS: 1.0000 mg | ORAL_TABLET | ORAL | Status: DC | PRN

## 2023-06-10 MED ORDER — MORPHINE 100MG IN NS 100ML (1MG/ML) PREMIX INFUSION: 1.0000 mg/h | INTRAVENOUS | Status: DC

## 2023-06-10 NOTE — Progress Notes (Signed)
 [facility] PROGRESS NOTE   Abhimanyu Cruces ION:[id number] DOB: 12/19/1945 DOA: 05/26/2023  PCP: Smitty Cords, DO  Brief History:    78 y.o. male with medical history significant of HTN, stable BPH, could not tolerate Flomax, gout, lumbar fusion, history of kidney stone presents from home with syncopal episode.  He was found to have left-sided gaze preference and left arm and leg weakness.  Presented as a code stroke.  Apparently also had a shaking episode.  Seen by neurology.  No evidence of stroke on imaging, concern was for seizure activity.  Most likely in the setting of hypotension, he was hospitalized for further management.  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, he was seen by neurology, started on AED, he was stabilized, as well blood pressure has been stabilized, and plan for SNF placement, he was awaiting SNF bed availability, on the morning of 06/09/2023, he was noted very difficult to arouse, upon exam pupils were fixed and dilated bilaterally, stat CT head was obtained, significant for severe acute intracranial hemorrhage, with IVH, severe mass effect with left line midline shift, neurology input greatly appreciated, there is no meaningful recovery anticipated, unsurvivable without significant life quality to duration, upon discussion with the family plan was to proceed with full comfort measures as patient would have never wanted to be on life support or have life with poor neurological function.  Consultants: Neurology, palliative    Subjective:  No significant events overnight, patient sleeping comfortably,   Assessment/Plan:   ICH, subdural hematoma, mass effect with midline shift . With underlying cerebral amyloid angiopathy, which puts him at very high risk for hemorrhagic stroke -Patient unresponsive, unable to wake up 3/20, stat CT head significant for intracranial hemorrhage, subdural  hematoma, with significant mass effect and midline shift, possible ACA infarct -Input greatly appreciated, very poor prognosis, no anticipated meaningful neurological outcome if he survives this SDH, alert event likely unsurvivable, and if he does survive he would likely have significant disability and may recover or regain consciousness, so plan to proceed with full comfort measures  Seizure/acute metabolic encephalopathy    Thought to have seizure activity.  Did have left-sided symptoms.  Initially thought to be stroke.  CT head and MRI brain stable, initially was on Keppra but due to encephalopathy switched to Depakote with good effect.  Echocardiogram nonacute as well.   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   It is possible that patient has old right parietal injury and due to times of stress i.e. lack of blood supply, hypoperfusion, hypotension, hypoglycemia, fever, infection he has excitable focus giving him focal seizure.  Most of his seizures affect his left arm.  He has been adequately hydrated, placed on midodrine, TED stockings, overall much better.  Now at baseline, EEG was negative, seen  by neuro.      Orthostatic hypotension.  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    Normocytic anemia  No evidence of overt bleeding.    Essential hypertension - blood pressure is improved have placed  him on low-dose Coreg, slight elevated blood pressure readings to avoid orthostatic hypotension which has contributed to his seizures.  Hyperlipidemia -  Hypokalemia and  hypomagnesemia.  Replaced.    History of gout     Obesity Estimated body mass index is 33.63 kg/m as calculated from the following:   Height as of this encounter: 5\' 10"  (1.778 m).   Weight as of this encounter: 106.3 kg.  DVT Prophylaxis: Comfort Code Status: Comfort Family Communication: Discussed with granddaughter at bedside  Status is: Inpatient Disposition : Hospital death versus residential hospice  Medications: Scheduled:   Continuous:  morphine     MWN:UUVOZDGUY, glycopyrrolate **OR** glycopyrrolate **OR** glycopyrrolate, haloperidol **OR** haloperidol **OR** haloperidol lactate, LORazepam **OR** [DISCONTINUED] LORazepam **OR** LORazepam, morphine injection, ondansetron **OR** ondansetron (ZOFRAN) IV, polyvinyl alcohol   Objective:  Vital Signs  Vitals:   06/09/23 1459 06/09/23 1935 06/10/23 0748 06/10/23 0800  BP: (!) 124/101 120/68 (!) 182/110 (!) 181/102  Pulse: 67 98 83 85  Resp: 16 20 14 14   Temp: (!) 97.5 F (36.4 C)  98.5 F (36.9 C) 98.5 F (36.9 C)  TempSrc: Oral  Oral Oral  SpO2: 93% 92% 91% 91%  Weight:      Height:       No intake or output data in the 24 hours ending 06/10/23 1301   Filed Weights   05/26/23 1608 05/27/23 0455 05/30/23 2346  Weight: 107.5 kg 111.9 kg 106.3 kg    Exam  Sleeping comfortably, not arousable, does not withdraw to pain. Good air entry Regular rate and rhythm No Cyanosis, Clubbing or edema    Lab Results:  Data Reviewed: I have personally reviewed following labs and reports of the imaging studies  CBC: No results for input(s): "WBC", "NEUTROABS", "HGB", "HCT", "MCV", "PLT" in the last 168 hours.   Basic Metabolic Panel: Recent Labs  Lab 06/05/23 0556  NA 140  K 3.9  CL 106  CO2 26  GLUCOSE 95  BUN 38*  CREATININE 1.32*  CALCIUM 8.9  MG 1.9    GFR: Estimated Creatinine Clearance: 56.3 mL/min (A) (by C-G formula based on SCr of 1.32 mg/dL (H)).  Liver Function Tests: Recent Labs  Lab  06/04/23 1512  AST 29  ALT 20  ALKPHOS 61  BILITOT 0.7  PROT 5.4*  ALBUMIN 2.6*     Coagulation Profile: No results for input(s): "INR", "PROTIME" in the last 168 hours.   CBG: Recent Labs  Lab 06/05/23 1526 06/06/23 0848 06/06/23 1305 06/06/23 1734 06/09/23 1012  GLUCAP 89 79 91 90 163*     Radiology Studies: CT HEAD WO CONTRAST ( ) Result Date: 06/09/2023 CLINICAL DATA:  78 year old male is unresponsive. Suspected amyloid angiopathy on MRI. EXAM: CT HEAD WITHOUT CONTRAST TECHNIQUE: Contiguous axial images were obtained from the base of the skull through the vertex without intravenous contrast. RADIATION DOSE REDUCTION: This exam was performed according to the departmental dose-optimization program which includes automated exposure control, adjustment of the mA and/or kV according to patient size and/or use of iterative reconstruction technique. COMPARISON:  CT head and CTA head and neck 05/31/2023. FINDINGS: Brain: Large volume new intracranial hemorrhage in the right hemisphere. This appears to be a combination of intra-axial bleeding (coronal image 35 posterior right temporal lobe) with extension into the right subdural space (same image). Large hyperdense subdural hematoma volume, as much as 3 cm in thickness and most pronounced along the anterior superior frontal convexity. Small volume right para falcine and tentorial subdural blood. The  right temporal lobe intra-axial blood products are estimated to encompass an area of about 4 cm diameter. Additional smaller 1.5 cm area of inferior frontal pole intra-axial hemorrhage. Severe intracranial mass effect. Subfalcine herniation with leftward midline shift of 26 mm. Uncal herniation. Trapped left lateral ventricle, especially the temporal horn. Trace left occipital horn IVH. Effaced suprasellar, interpeduncular cisterns. Other basilar cisterns remain patent. Hypodensity in the right ACA territory suspicious for subfalcine associated  ACA infarct. Edema in the right temporal lobe could be ischemia or related to the large intra-axial hematoma there. No other cortically based infarct identified and elsewhere relatively maintained gray-white differentiation. Vascular: Calcified atherosclerosis at the skull base. No suspicious intracranial vascular hyperdensity. Skull: Stable and intact. Sinuses/Orbits: Visualized paranasal sinuses and mastoids are stable and well aerated. Other: No acute orbit or scalp soft tissue finding. IMPRESSION: 1. Very Severe Acute Intracranial Hemorrhage with a combination of Intra-axial (right temporal lobe, inferior frontal gyrus) and Large Right Side Subdural Hemorrhage. SDH nearly 3 cm in thickness. Trace IVH. 2. Severe intracranial mass effect with subfalcine herniation, leftward midline shift up to 26 mm. 3. Possible associated Right ACA infarct. Confluent right temporal lobe edema probably secondary to #1. Critical Value/emergent results were called by telephone at the time of interpretation on 06/09/2023 at 10:47 am to Dr. Huey Bienenstock , who verbally acknowledged these results. Electronically Signed   By: Odessa Fleming M.D.   On: 06/09/2023 10:48      LOS: 14 days   Signature  -    Huey Bienenstock M.D on 06/10/2023 at 1:01 PM   -  To page go to [email or web address]

## 2023-06-10 NOTE — Consult Note (Signed)
 Palliative Care Consult Note                                  Date: 06/10/2023   [name]  DOB: 09-16-1945  MRN: [id number]  Age / Sex: 78 y.o., male  PCP: Smitty Cords, DO Referring Physician: Starleen Arms, MD  Reason for Consultation: End-of-life  HPI/Patient Profile: 78 y.o. male  with past medical history of HTN, stable BPH (could not tolerate Flomax), gout, lumbar fusion, history of kidney stone admitted on 05/26/2023 with left-sided gaze preference and left arm and leg weakness.   Code stroke. No evidence of stroke on imaging, concern was for seizure activity. He was seen by neurology, started on AED, and blood pressure stabilized. On 06/09/2023 while waiting for SNF bed availability he was noted very difficult to arouse. Upon exam pupils were fixed and dilated bilaterally. Stat CT head was significant for severe acute intracranial hemorrhage, with IVH, severe mass effect with left line midline shift.  Past Medical History:  Diagnosis Date   BPH (benign prostatic hyperplasia)    Gout    Hypertension    Kidney stones     Subjective:   I have reviewed medical records including EPIC notes, labs and imaging, received update from Dr. Randol Kern, assessed the patient and then met with his family at bedside-- son Casimiro Needle and daughter in law Herbert Seta, granddaughter Lanora Manis, and daughters Karoline Caldwell and Lyla Son (by phone) to discuss diagnosis prognosis, GOC, EOL wishes, disposition and options.  I introduced Palliative Medicine as specialized medical care for people living with serious illness. It focuses on providing relief from symptoms and stress of a serious illness. The goal is to improve quality of life for both the patient and the family.  Today's Discussion: Patient is lying in bed in NAD. He is unresponsive. His granddaughter is at bedside and requests I return at 1100 am when the patient's children are  available.  1100: Met with family at bedside. They have a good understanding of his chronic conditions and acute hospitalization. They understand the severity of his intracranial hemorrhage and decided yesterday to transition him to comfort measures. They share they want him to have dignity and comfort during his end of life.  We discussed options moving forward including hospice. We discussed hospice philosophy and options. Family are interested in inpatient hospice in [place] with [facility] which is close to family. TOC order placed. We discussed that acceptance to hospice in any specific location is the final decision of the hospice medical director and bed availability, if applicable. We discussed ensuring the patient remain comfortable and discussed both morphine boluses and continuous morphine infusion. Order placed for continuous morphine infusion. Family asks if the patient may become uncomfortable with no hydration. We discuss that most of this discomfort would come from dry mouth and we can request excellent oral care and use moist sponges to keep his mouth moist. We discuss that decreased intake is part of the dying process and expected. We discuss that continuing IV fluids at end of life can lead to discomfort. They express understanding.   The patient is married with  three children Karoline Caldwell, [place], [place]). Prior to admission patient lived with his daughter Lyla Son and his wife. The family share that the patient was a family man. He prioritized his children and grandchildren in his life. 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 He had a back surgery with long hospitalization in 2017 and never regained his functional status. His family shares this winter was particularly difficult for him due to his decreased mobility. He was looking forward to planting his spring garden. Emotional support provided.  Questions and concerns were addressed. Hard  Choices booklet left for review. The family was encouraged to call with questions or concerns. PMT will continue to support holistically.  Review of Systems  Unable to perform ROS   Objective:   Primary Diagnoses: Present on Admission: **None**   Physical Exam Vitals reviewed.  Constitutional:      Appearance: He is ill-appearing.     Interventions: Nasal cannula in place.  Cardiovascular:     Rate and Rhythm: Normal rate.  Pulmonary:     Effort: Pulmonary effort is normal.  Neurological:     Mental Status: He is unresponsive.    Vital Signs:  BP (!) 181/102 (BP Location: Right Arm)   Pulse 85   Temp 98.5 F (36.9 C) (Oral)   Resp 14   Ht 5\' 10"  (1.778 m)   Wt 106.3 kg   SpO2 91%   BMI 33.63 kg/m   Palliative Assessment/Data: 10%    Advanced Care Planning:   Existing Vynca/ACP Documentation: None  Primary Decision Maker: NEXT OF KIN  Code Status/Advance Care Planning: DNR   Assessment & Plan:   SUMMARY OF RECOMMENDATIONS   DNR Full comfort care Comfort medications per primary team Starting continuous morphine infusion at 1mg /hr Accepted to inpatient hospice with [facility]. TOC order placed. Continued PMT support    Discussed with: Dr. Randol Kern  Time Total: 90 minutes    Thank you for allowing [place] to participate in the care of Wolfgang Finigan PMT will continue to support holistically.   Signed by: Sarina Ser, NP Palliative Medicine Team  Team Phone # [phone] (Nights/Weekends)  06/10/2023, 9:55 AM

## 2023-06-10 NOTE — TOC Progression Note (Signed)
 Transition of Care [facility]) - Progression Note    Patient Details  Name: Danny Patterson MRN: [id number] Date of Birth: 07/11/1945  Transition [facility]) CM/SW Contact  Mearl Latin, LCSW Phone Number: 06/10/2023, 2:11 PM  Clinical Narrative:    CSW received family's request for [facility]. [facility] reviewing.    Expected Discharge Plan: Skilled Nursing Facility Barriers to Discharge: Other (must enter comment) (24 hours after haldol per snf)  Expected Discharge Plan and Services In-house Referral: Clinical Social Work   Post Acute Care Choice: Skilled Nursing Facility Living arrangements for the past 2 months: Single Family Home Expected Discharge Date: 06/06/23                                     Social Determinants of Health (SDOH) Interventions SDOH Screenings   Food Insecurity: Patient Unable To Answer (05/27/2023)  Housing: Low Risk  (05/31/2023)  Transportation Needs: Patient Unable To Answer (05/27/2023)  Utilities: Patient Unable To Answer (05/27/2023)    Readmission Risk Interventions     No data to display

## 2023-06-10 NOTE — TOC Transition Note (Signed)
 Transition of Care [facility]) - Discharge Note   Patient Details  Name: Danny Patterson MRN: [id number] Date of Birth: 08/12/1945  Transition of Care [facility]) CM/SW Contact:  Mearl Latin, LCSW Phone Number: 06/10/2023, 4:04 PM   Clinical Narrative:    Patient will DC to: [facility] Anticipated DC date: 06/10/23 Family notified: Daughter and Son Transport by: Sharin Mons   Per MD patient ready for DC to [facility]. RN to call report prior to discharge [phone]). RN, patient, patient's family, and facility notified of DC. Discharge Summary sent to facility. DC packet on chart including signed DNR. Ambulance transport requested for patient.   CSW will sign off for now as social work intervention is no longer needed. Please consult [place] again if new needs arise.     Final next level of care: Hospice Medical Facility Barriers to Discharge: Barriers Resolved   Patient Goals and CMS Choice Patient states their goals for this hospitalization and ongoing recovery are:: Rehab CMS Medicare.gov Compare Post Acute Care list provided to:: Patient Represenative (must comment) Choice offered to / list presented to : Adult Children [place] ownership interest in [facility].provided to:: Adult Children    Discharge Placement              Patient chooses bed at:  [facility]) Patient to be transferred to facility by: [facility] Name of family member notified: Daughter Patient and family notified of of transfer: 06/10/23  Discharge Plan and Services Additional resources added to the After Visit Summary for   In-house Referral: Clinical Social Work   Post Acute Care Choice: Skilled Nursing Facility                               Social Drivers of Health (SDOH) Interventions SDOH Screenings   Food Insecurity: Patient Unable To Answer (05/27/2023)  Housing: Low Risk  (05/31/2023)  Transportation Needs: Patient Unable To Answer (05/27/2023)   Utilities: Patient Unable To Answer (05/27/2023)     Readmission Risk Interventions     No data to display

## 2023-06-10 NOTE — Progress Notes (Signed)
 Report given to Maralyn Sago at [facility]).

## 2023-06-10 NOTE — Discharge Summary (Signed)
 Physician Discharge Summary  Danny Patterson CZY:[id number] DOB: 03/17/1946 DOA: 05/26/2023  PCP: Smitty Cords, DO  Admit date: 05/26/2023 Discharge date: 06/10/2023  Admitted From: (Home) Disposition:  (Residential hospice)  Recommendations for Outpatient Follow-up:  Management by hospice   Brief/Interim Summary:   78 y.o. male with medical history significant of HTN, stable BPH, could not tolerate Flomax, gout, lumbar fusion, history of kidney stone presents from home with syncopal episode.  He was found to have left-sided gaze preference and left arm and leg weakness.  Presented as a code stroke.  Apparently also had a shaking episode.  Seen by neurology.  No evidence of stroke on imaging, concern was for seizure activity.  Most likely in the setting of hypotension, he was hospitalized for further management.  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, he was seen by neurology, started on AED, he was stabilized, as well blood pressure has been stabilized, and plan for SNF placement, he was awaiting SNF bed availability, on the morning of 06/09/2023, he was noted very difficult to arouse, upon exam pupils were fixed and dilated bilaterally, stat CT head was obtained, significant for severe acute intracranial hemorrhage, with IVH, severe mass effect with left line midline shift, neurology input greatly appreciated, there is no meaningful recovery anticipated, unsurvivable without significant life quality to duration, upon discussion with the family plan was to proceed with full comfort measures as patient would have never wanted to be on life support or have life with poor neurological function.  Patient has been transitioned to full comfort measures, and plan for residential hospice discharge today.  ICH, subdural hematoma, mass effect with midline shift . With underlying cerebral amyloid angiopathy, which puts him at very high risk  for hemorrhagic stroke -Patient unresponsive, unable to wake up 3/20, stat CT head significant for intracranial hemorrhage, subdural hematoma, with significant mass effect and midline shift, possible ACA infarct -Input greatly appreciated, very poor prognosis, no anticipated meaningful neurological outcome if he survives this SDH, alert event likely unsurvivable, and if he does survive he would likely have significant disability and may recover or regain consciousness, so plan to proceed with full comfort measures   Seizure/acute metabolic encephalopathy    Thought to have seizure activity.  Did have left-sided symptoms.  Initially thought to be stroke.  CT head and MRI brain stable, initially was on Keppra but due to encephalopathy switched to Depakote with good effect.  Echocardiogram nonacute as well.   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   It is possible that patient has old right parietal injury and due to times of stress i.e. lack of blood supply, hypoperfusion, hypotension, hypoglycemia, fever, infection he has excitable focus giving him focal seizure.  Most of his seizures affect his left arm.  He has been adequately hydrated, placed on midodrine, TED stockings, overall much better.  Now at baseline, EEG was negative, seen  by neuro.    History of CVA  Noted on MRI with evidence of right parietal CVA   Orthostatic hypotension.  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     Normocytic anemia  No evidence of overt bleeding.     Essential hypertension - blood pressure is  improved have placed him on low-dose Coreg, slight elevated blood pressure readings to avoid orthostatic hypotension which has contributed to his seizures.   Hyperlipidemia -   Hypokalemia and hypomagnesemia.  Replaced.     History of gout      Obesity Estimated body mass index is 33.63 kg/m as calculated from the following:   Height as of this encounter: 5\' 10"  (1.778 m).   Weight as of this encounter: 106.3 kg.   End-of-life care  palliative medicine assistance greatly appreciated, end-of-life care order set has been initiated, Foley catheter was inserted for comfort, he was started on morphine drip during hospital stay, started on scheduled glycopyrrolate as well, plan to discharge to [facility] residential hospice house .  Discharge Diagnoses:  Principal Problem:   Seizures (HCC) Active Problems:   Cerebral amyloid angiopathy (CODE)   Intracranial hemorrhage (HCC)   Acute metabolic encephalopathy   History of CVA (cerebrovascular accident)   Orthostatic hypotension   AKI (acute kidney injury) (HCC)   Midline shift of brain   End of life care    Discharge Instructions  Discharge Instructions     Diet - low sodium heart healthy   Complete by: As directed    Discharge instructions   Complete by: As directed    Management per residential hospice   Increase activity slowly   Complete by: As directed       Allergies as of 06/10/2023       Reactions   Ace Inhibitors Anaphylaxis   Mouth swelling    Hydrocodone Other (See Comments)   Patient began reaching for objects that are not there and restlessness        Medication List     STOP taking these medications    allopurinol 300 MG tablet Commonly known as: ZYLOPRIM   ALPHA LIPOIC ACID PO   amLODipine 5 MG tablet Commonly known as: NORVASC   atorvastatin 20 MG tablet Commonly known as: LIPITOR   baclofen 10 MG tablet Commonly known as: LIORESAL   colchicine 0.6 MG tablet   furosemide 20 MG  tablet Commonly known as: LASIX   multivitamin with minerals Tabs tablet   Potassium 99 MG Tabs   potassium citrate 10 MEQ (1080 MG) SR tablet Commonly known as: UROCIT-K       TAKE these medications    glycopyrrolate 1 MG tablet Commonly known as: ROBINUL Take 1 tablet (1 mg total) by mouth every 4 (four) hours as needed (excessive secretions).   LORazepam 1 MG tablet Commonly known as: ATIVAN Take 1 tablet (1 mg total) by mouth every 4 (four) hours as needed for anxiety.   morphine (PF) 2 MG/ML injection Inject 0.5-2 mLs (1-4 mg total) into the vein every 15 (fifteen) minutes as needed (To alleviate signs and symptoms of distress).   morphine 1 mg/mL Soln infusion Inject 1-4 mg/hr into the vein continuous.        Contact information for after-discharge care     Destination     [facility] Preferred SNF .   Service: Skilled Nursing Contact information: [address] [place] [address] [phone]                    Allergies  Allergen  Reactions   Ace Inhibitors Anaphylaxis    Mouth swelling    Hydrocodone Other (See Comments)    Patient began reaching for objects that are not there and restlessness    Consultations: Neurology, palliative   Procedures/Studies: CT HEAD WO CONTRAST ( ) Result Date: 06/09/2023 CLINICAL DATA:  78 year old male is unresponsive. Suspected amyloid angiopathy on MRI. EXAM: CT HEAD WITHOUT CONTRAST TECHNIQUE: Contiguous axial images were obtained from the base of the skull through the vertex without intravenous contrast. RADIATION DOSE REDUCTION: This exam was performed according to the departmental dose-optimization program which includes automated exposure control, adjustment of the mA and/or kV according to patient size and/or use of iterative reconstruction technique. COMPARISON:  CT head and CTA head and neck 05/31/2023.  FINDINGS: Brain: Large volume new intracranial hemorrhage in the right hemisphere. This appears to be a combination of intra-axial bleeding (coronal image 35 posterior right temporal lobe) with extension into the right subdural space (same image). Large hyperdense subdural hematoma volume, as much as 3 cm in thickness and most pronounced along the anterior superior frontal convexity. Small volume right para falcine and tentorial subdural blood. The right temporal lobe intra-axial blood products are estimated to encompass an area of about 4 cm diameter. Additional smaller 1.5 cm area of inferior frontal pole intra-axial hemorrhage. Severe intracranial mass effect. Subfalcine herniation with leftward midline shift of 26 mm. Uncal herniation. Trapped left lateral ventricle, especially the temporal horn. Trace left occipital horn IVH. Effaced suprasellar, interpeduncular cisterns. Other basilar cisterns remain patent. Hypodensity in the right ACA territory suspicious for subfalcine associated ACA infarct. Edema in the right temporal lobe could be ischemia or related to the large intra-axial hematoma there. No other cortically based infarct identified and elsewhere relatively maintained gray-white differentiation. Vascular: Calcified atherosclerosis at the skull base. No suspicious intracranial vascular hyperdensity. Skull: Stable and intact. Sinuses/Orbits: Visualized paranasal sinuses and mastoids are stable and well aerated. Other: No acute orbit or scalp soft tissue finding. IMPRESSION: 1. Very Severe Acute Intracranial Hemorrhage with a combination of Intra-axial (right temporal lobe, inferior frontal gyrus) and Large Right Side Subdural Hemorrhage. SDH nearly 3 cm in thickness. Trace IVH. 2. Severe intracranial mass effect with subfalcine herniation, leftward midline shift up to 26 mm. 3. Possible associated Right ACA infarct. Confluent right temporal lobe edema probably secondary to #1. Critical Value/emergent  results were called by telephone at the time of interpretation on 06/09/2023 at 10:47 am to Dr. Huey Bienenstock , who verbally acknowledged these results. Electronically Signed   By: Odessa Fleming M.D.   On: 06/09/2023 10:48   Overnight EEG with video Result Date: 06/01/2023 Charlsie Quest, MD     06/02/2023 10:13 AM [name] MRN: [id number] Epilepsy Attending: Charlsie Quest Referring Physician/Provider: Lynnae January, NP Duration: 05/31/2023 1539 to 06/01/2023 1034 Patient history:  78 yo M with syncopal episode at the kitchen table and fell around at 1420. Now with Left-sided gaze preference, left arm and leg weakness and hemianopia of the left side. Also developed numbness of the left leg. EEG to evaluate for seizure  Level of alertness:  awake, asleep  AEDs during EEG study: LEV  Technical aspects: This EEG study was done with scalp electrodes positioned according to the 10-20 International system of electrode placement. Electrical activity was reviewed with band pass filter of 1-70Hz , sensitivity of 7 uV/mm, display speed of 74mm/sec with a 60Hz  notched filter applied as appropriate. EEG data were recorded continuously and digitally stored.  Video monitoring was available and reviewed as appropriate.  Description: The posterior dominant rhythm consists of 7.5 Hz activity of moderate voltage (25-35 uV) seen predominantly in posterior head regions, symmetric and reactive to eye opening and eye closing. EEG showed continuous generalized and maximal right parieto-occipital 3 to 6 Hz theta-delta slowing admixed with 13-15hz  beta activity. Sleep was characterized by vertex waves, sleep spindles (12 to 14 Hz), maximal frontocentral region. Hyperventilation and photic stimulation were not performed.    ABNORMALITY -Continuous slow, generalized and maximal right parieto-occipital region  IMPRESSION: This study is suggestive of cortical dysfunction in right parieto-occipital region likely secondary to  underlying encephalomalacia. Additionally there is moderate diffuse encephalopathy. No seizures or epileptiform discharges were noted.  Priyanka Annabelle Harman   US RENAL Result Date: 06/01/2023 CLINICAL DATA:  Acute kidney injury EXAM: RENAL / URINARY TRACT ULTRASOUND COMPLETE COMPARISON:  CT 05/27/2022 FINDINGS: Right Kidney: Renal measurements: 11.8 by 5.5 x 4.9 cm = volume: 166.5 mL. Echogenicity within normal limits. No mass or hydronephrosis visualized. Left Kidney: Renal measurements: 10.1 x 5.8 x 4.4 cm = volume: 135 mL. Echogenicity within normal limits. No mass or hydronephrosis visualized. Bladder: Appears normal for degree of bladder distention. Other: None. IMPRESSION: Negative renal ultrasound. Electronically Signed   By: Jasmine Pang M.D.   On: 06/01/2023 00:07   CT ANGIO HEAD NECK W WO CM (CODE STROKE) Result Date: 05/31/2023 CLINICAL DATA:  Code stroke. 78 year old male with weakness. Leftward gaze. EXAM: CT ANGIOGRAPHY HEAD AND NECK TECHNIQUE: Multidetector CT imaging of the head and neck was performed using the standard protocol during bolus administration of intravenous contrast. Multiplanar CT image reconstructions and MIPs were obtained to evaluate the vascular anatomy. Carotid stenosis measurements (when applicable) are obtained utilizing NASCET criteria, using the distal internal carotid diameter as the denominator. RADIATION DOSE REDUCTION: This exam was performed according to the departmental dose-optimization program which includes automated exposure control, adjustment of the mA and/or kV according to patient size and/or use of iterative reconstruction technique. CONTRAST:  75mL OMNIPAQUE IOHEXOL 350 MG/ML SOLN COMPARISON:  Recent CTA head and neck 05/26/2023. FINDINGS: CTA NECK Skeleton: Stable. Diffuse idiopathic skeletal hyperostosis (DISH). Bulky cervical spine degeneration. Upper chest: Stable.  Mild respiratory motion. Other neck: Neck soft tissue contours are within normal limits.  Aortic arch: 3 vessel arch not completely visible today. Stable mild arch tortuosity, minimal atherosclerosis. Right carotid system: Patent and stable. Relatively mild proximal right ICA calcified plaque. Left carotid system: Patent and stable. Relatively mild left carotid bifurcation plaque. Vertebral arteries: Patent and stable. Codominant vertebral arteries with tortuosity but no significant plaque. CTA HEAD Posterior circulation: Patent and stable. Mild for age posterior circulation atherosclerosis with no significant stenosis. Anterior circulation: Both ICA siphons are patent with motion artifact at the supraclinoid segments. Bilateral siphon calcified plaque. Mild to moderate supraclinoid stenosis on the right, no definite stenosis on the left. Patent carotid termini, MCA and ACA origins. Normal anterior communicating artery. Bilateral ACA and MCA branches are stable with no significant stenosis when allowing for mild motion. Venous sinuses: Early contrast timing, not well evaluated. Anatomic variants: None significant. Review of the MIP images confirms the above findings IMPRESSION: 1. No large vessel occlusion. Stable CTA from 05/26/2023 with mild for age atherosclerosis in the head and neck. 2. 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 No other significant is stenosis identified. Electronically Signed   By: Odessa Fleming M.D.   On: 05/31/2023 13:54  CT HEAD CODE STROKE WO CONTRAST Result Date: 05/31/2023 CLINICAL DATA:  Code stroke. 78 year old male with weakness. Leftward gaze. EXAM: CT HEAD WITHOUT CONTRAST TECHNIQUE: Contiguous axial images were obtained from the base of the skull through the vertex without intravenous contrast. RADIATION DOSE REDUCTION: This exam was performed according to the departmental dose-optimization program which includes automated exposure control, adjustment of the mA and/or kV according to patient size and/or use of iterative reconstruction  technique. COMPARISON:  Brain MRI yesterday.  Head CT 05/26/2023. FINDINGS: Brain: No midline shift, mass effect, or evidence of intracranial mass lesion. No acute intracranial hemorrhage identified. No ventriculomegaly. Stable gray-white matter differentiation throughout the brain. Confluent right parietal lobe subcortical and white matter hypodensity appears stable from FLAIR signal changes yesterday. Extensive chronic microhemorrhages and hemosiderin in the brain associated. No cortically based acute infarct identified. Vascular: Calcified atherosclerosis at the skull base. No suspicious intracranial vascular hyperdensity. Skull: Stable, intact. Sinuses/Orbits: Visualized paranasal sinuses and mastoids are stable and well aerated. Other: No significant gaze deviation on these images. Visualized scalp soft tissues are within normal limits. ASPECTS [facility] Stroke Program Early CT Score) Total score (0-10 with 10 being normal): 10 IMPRESSION: 1. Stable non contrast CT appearance of the brain. No acute cortically based infarct or acute intracranial hemorrhage identified. ASPECTS 10. 2. Confluent right parietal lobe hypodensity stable from MRI yesterday. Query acute versus chronic amyloid related inflammation given that appearance. Salient findings were communicated to Dr. Amada Jupiter at 1:46 pm on 05/31/2023 by text page via the [facility] messaging system. Electronically Signed   By: Odessa Fleming M.D.   On: 05/31/2023 13:47   MR BRAIN WO CONTRAST Result Date: 05/30/2023 CLINICAL DATA:  Seizure disorder EXAM: MRI HEAD WITHOUT CONTRAST TECHNIQUE: Multiplanar, multiecho pulse sequences of the brain and surrounding structures were obtained without intravenous contrast. COMPARISON:  None Available. FINDINGS: Brain: No acute infarct, mass effect or extra-axial collection. Numerous chronic microhemorrhages in a predominantly peripheral distribution. Chronic subarachnoid blood over the left frontal lobe and medial right parietal  lobe. There is multifocal hyperintense T2-weighted signal within the white matter. Generalized volume loss. Old right parietal infarct. The midline structures are normal. Vascular: Normal flow voids. Skull and upper cervical spine: Normal calvarium and skull base. Visualized upper cervical spine and soft tissues are normal. Sinuses/Orbits:No paranasal sinus fluid levels or advanced mucosal thickening. No mastoid or middle ear effusion. Normal orbits. IMPRESSION: 1. No acute intracranial abnormality. 2. Numerous chronic microhemorrhages in a predominantly peripheral distribution, most consistent with cerebral amyloid angiopathy. 3. 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 Electronically Signed   By: Deatra Robinson M.D.   On: 05/30/2023 19:08   ECHOCARDIOGRAM COMPLETE Result Date: 05/30/2023    ECHOCARDIOGRAM REPORT   Patient Name:   Danny Patterson Date of Exam: 05/30/2023 Medical Rec #:  [id number]   Height:       70.0 in Accession #:    [id number]  Weight:       246.7 lb Date of Birth:  1946-01-09   BSA:          2.282 m Patient Age:    78 years    BP:           172/83 mmHg Patient Gender: M           HR:           82 bpm. Exam Location:  Inpatient Procedure: 2D Echo, Cardiac Doppler, Color Doppler and Intracardiac  Opacification Agent (Both Spectral and Color Flow Doppler were            utilized during procedure). Indications:    Stroke  History:        Patient has prior history of Echocardiogram examinations, most                 recent 10/08/2015.  Sonographer:    Karma Ganja Referring Phys: [phone] MCNEILL P KIRKPATRICK  Sonographer Comments: Technically difficult study due to poor echo windows and no subcostal window. Image acquisition challenging due to patient body habitus and Image acquisition challenging due to uncooperative patient. IMPRESSIONS  1. Left ventricular ejection fraction, by estimation, is 60 to 65%. The left ventricle has normal function. The left ventricle  has no regional wall motion abnormalities. There is mild concentric left ventricular hypertrophy. Left ventricular diastolic function could not be evaluated.  2. Right ventricular systolic function is normal. The right ventricular size is normal.  3. The mitral valve is normal in structure. No evidence of mitral valve regurgitation. No evidence of mitral stenosis.  4. The aortic valve has an indeterminant number of cusps. Aortic valve regurgitation is not visualized. No aortic stenosis is present.  5. The inferior vena cava is normal in size with greater than 50% respiratory variability, suggesting right atrial pressure of 3 mmHg. FINDINGS  Left Ventricle: Left ventricular ejection fraction, by estimation, is 60 to 65%. The left ventricle has normal function. The left ventricle has no regional wall motion abnormalities. Definity contrast agent was given IV to delineate the left ventricular  endocardial borders. The left ventricular internal cavity size was normal in size. There is mild concentric left ventricular hypertrophy. Left ventricular diastolic function could not be evaluated due to indeterminate diastolic function. Left ventricular diastolic function could not be evaluated. Right Ventricle: The right ventricular size is normal. No increase in right ventricular wall thickness. Right ventricular systolic function is normal. Left Atrium: Left atrial size was normal in size. Right Atrium: Right atrial size was normal in size. Pericardium: There is no evidence of pericardial effusion. Mitral Valve: The mitral valve is normal in structure. No evidence of mitral valve regurgitation. No evidence of mitral valve stenosis. Tricuspid Valve: The tricuspid valve is normal in structure. Tricuspid valve regurgitation is not demonstrated. No evidence of tricuspid stenosis. Aortic Valve: The aortic valve has an indeterminant number of cusps. Aortic valve regurgitation is not visualized. No aortic stenosis is present. Aortic  valve mean gradient measures 4.0 mmHg. Aortic valve peak gradient measures 7.2 mmHg. Aortic valve area, by VTI measures 2.33 cm. Pulmonic Valve: The pulmonic valve was not well visualized. Pulmonic valve regurgitation is not visualized. No evidence of pulmonic stenosis. Aorta: The aortic root is normal in size and structure. Venous: The inferior vena cava is normal in size with greater than 50% respiratory variability, suggesting right atrial pressure of 3 mmHg. IAS/Shunts: No atrial level shunt detected by color flow Doppler.  LEFT VENTRICLE PLAX 2D LVIDd:         5.60 cm      Diastology LVIDs:         2.70 cm      LV e' medial:    6.68 cm/s LV PW:         1.30 cm      LV E/e' medial:  8.0 LV IVS:        1.30 cm      LV e' lateral:   6.68 cm/s LVOT diam:  2.10 cm      LV E/e' lateral: 8.0 LV SV:         57 LV SV Index:   25 LVOT Area:     3.46 cm  LV Volumes (MOD) LV vol d, MOD A2C: 135.0 ml LV vol d, MOD A4C: 151.0 ml LV vol s, MOD A2C: 47.0 ml LV vol s, MOD A4C: 54.4 ml LV SV MOD A2C:     88.0 ml LV SV MOD A4C:     151.0 ml LV SV MOD BP:      92.2 ml LEFT ATRIUM           Index LA diam:      4.40 cm 1.93 cm/m LA Vol (A4C): 88.4 ml 38.74 ml/m  AORTIC VALVE AV Area (Vmax):    2.46 cm AV Area (Vmean):   2.25 cm AV Area (VTI):     2.33 cm AV Vmax:           134.00 cm/s AV Vmean:          90.500 cm/s AV VTI:            0.247 m AV Peak Grad:      7.2 mmHg AV Mean Grad:      4.0 mmHg LVOT Vmax:         95.00 cm/s LVOT Vmean:        58.700 cm/s LVOT VTI:          0.166 m LVOT/AV VTI ratio: 0.67  AORTA Ao Root diam: 3.40 cm Ao Asc diam:  3.30 cm MITRAL VALVE MV Area (PHT): 2.87 cm    SHUNTS MV Decel Time: 264 msec    Systemic VTI:  0.17 m MV E velocity: 53.60 cm/s  Systemic Diam: 2.10 cm MV A velocity: 82.00 cm/s MV E/A ratio:  0.65 Aditya Sabharwal Electronically signed by Dorthula Nettles Signature Date/Time: 05/30/2023/12:01:41 PM    Final    DG Chest Port 1 View Result Date: 05/30/2023 CLINICAL DATA:   Shortness of breath.  Stroke. EXAM: PORTABLE CHEST 1 VIEW COMPARISON:  03/29/2022 FINDINGS: Stable cardiomediastinal contours. Aortic atherosclerosis. Asymmetric elevation of right hemidiaphragm, unchanged. Increase opacity within the retrocardiac left lower lobe which may reflect atelectasis or airspace disease. Visualized osseous structures are notable for remote healed left clavicle fracture. No acute osseous findings. IMPRESSION: Increase opacity within the retrocardiac left lower lobe which may reflect atelectasis or airspace disease. Electronically Signed   By: Signa Kell M.D.   On: 05/30/2023 06:30   Overnight EEG with video Result Date: 05/27/2023 Charlsie Quest, MD     05/28/2023  7:31 AM [name] MRN: [id number] Epilepsy Attending: Charlsie Quest Referring Physician/Provider: Rejeana Brock, MD Duration: 05/26/2023 2258 to 05/27/2023 2258 Patient history:  78 yo M with syncopal episode at the kitchen table and fell around at 1420. Now with Left-sided gaze preference, left arm and leg weakness and hemianopia of the left side. Also developed numbness of the left leg. EEG to evaluate for seizure  Level of alertness:  awake, asleep  AEDs during EEG study: LEV  Technical aspects: This EEG study was done with scalp electrodes positioned according to the 10-20 International system of electrode placement. Electrical activity was reviewed with band pass filter of 1-70Hz , sensitivity of 7 uV/mm, display speed of 29mm/sec with a 60Hz  notched filter applied as appropriate. EEG data were recorded continuously and digitally stored.  Video monitoring was available and reviewed as appropriate.  Description:  No posterior dominant rhythm was seen. EEG showed continuous generalized and maximal right parieto-occipital 3 to 6 Hz theta-delta slowing admixed with 13-15hz  beta activity. Sleep was characterized by vertex waves, sleep spindles (12 to 14 Hz), maximal frontocentral region.   Hyperventilation and photic stimulation were not performed.    ABNORMALITY -Continuous slow, generalized and maximal right parieto-occipital region  IMPRESSION: This study is suggestive of cortical dysfunction in right parieto-occipital region likely secondary to underlying encephalomalacia.  Additionally there is moderate diffuse encephalopathy. No seizures or epileptiform discharges were noted.  Charlsie Quest    EEG adult Result Date: 05/26/2023 Charlsie Quest, MD     05/26/2023  6:49 PM [name] MRN: [id number] Epilepsy Attending: Charlsie Quest Referring Physician/Provider: Rejeana Brock, MD Date: 05/26/2023 Duration: 22.24 mins Patient history: 78 yo M with syncopal episode at the kitchen table and fell around at 1420. Now with Left-sided gaze preference, left arm and leg weakness and hemianopia of the left side. Also developed numbness of the left leg. EEG to evaluate for seizure Level of alertness:  lethargic/coma AEDs during EEG study: LEV Technical aspects: This EEG study was done with scalp electrodes positioned according to the 10-20 International system of electrode placement. Electrical activity was reviewed with band pass filter of 1-70Hz , sensitivity of 7 uV/mm, display speed of 34mm/sec with a 60Hz  notched filter applied as appropriate. EEG data were recorded continuously and digitally stored.  Video monitoring was available and reviewed as appropriate. Description: EEG showed continuous generalized and maximal right parieto-occipital 3 to 6 Hz theta-delta slowing. Lateralized periodic discharges with overriding fast activity were noted in right hemisphere, maximal right parieto-occipital region at 1 to 1.5 Hz, at times with overlying rhythmicity and waxing and waning morphology. Hyperventilation and photic stimulation were not performed.   ABNORMALITY -Lateralized periodic discharges with overriding fast activity and rhythmicity, right hemisphere, maximal right  parieto-occipital region -Continuous slow, generalized and maximal right parieto-occipital region IMPRESSION: This study showed evidence of epileptogenicity arising from right hemisphere, maximal right parieto-occipital region.  This EEG pattern is on the ictal-interictal continuum.  However, due to overlying fast activity, overlying rhythmicity as well as waxing and waning morphology, it is highly concerning for ictal nature. Additionally there is cortical dysfunction in right parieto-occipital region likely secondary to underlying encephalomalacia.  Lastly there is moderate diffuse encephalopathy. If concern for ictal-interictal activity persist, consider long-term EEG Dr. Amada Jupiter was notified Charlsie Quest   CT ANGIO HEAD NECK W WO CM W PERF (CODE STROKE) Result Date: 05/26/2023 CLINICAL DATA:  Code stroke. Left-sided paralysis. Last known well at 11 o'clock a.m. EXAM: CT ANGIOGRAPHY HEAD AND NECK CT PERFUSION BRAIN TECHNIQUE: Multidetector CT imaging of the head and neck was performed using the standard protocol during bolus administration of intravenous contrast. Multiplanar CT image reconstructions and MIPs were obtained to evaluate the vascular anatomy. Carotid stenosis measurements (when applicable) are obtained utilizing NASCET criteria, using the distal internal carotid diameter as the denominator. Multiphase CT imaging of the brain was performed following IV bolus contrast injection. Subsequent parametric perfusion maps were calculated using RAPID software. RADIATION DOSE REDUCTION: This exam was performed according to the departmental dose-optimization program which includes automated exposure control, adjustment of the mA and/or kV according to patient size and/or use of iterative reconstruction technique. CONTRAST:  OMNIPAQUE IOHEXOL 350 MG/ML SOLN COMPARISON:  100 mL Omnipaque 350 scratched at CT head without contrast 05/26/2023 at 3:20 p.m. FINDINGS: CTA NECK FINDINGS Aortic arch: A  3  vessel arch configuration is present. Minimal atherosclerotic calcifications are present in the distal arch. The great vessel origins are within normal limits. No focal stenosis or aneurysm is present. No dissection is present. Right carotid system: The right common carotid artery is within normal limits. Minimal calcifications are present in the proximal right external carotid artery and slightly more distal right internal carotid artery without significant stenosis. Cervical right ICA is otherwise normal. Left carotid system: The left common carotid artery is within normal limits. Minimal calcifications are present at the left carotid bifurcation without significant stenosis. The cervical left ICA is otherwise normal. Vertebral arteries: The vertebral arteries are codominant. Both vertebral arteries originate from the subclavian arteries without significant stenosis. No significant stenosis is present in either vertebral artery in neck. Skeleton: Moderate degenerative changes are present in the cervical spine. Facet hypertrophy contributes to moderate right foraminal stenosis at C3-4 uncovertebral spurring contributes to moderate foraminal stenosis bilaterally at C6-7. Other neck: The soft tissues of the neck are otherwise unremarkable. Salivary glands are within normal limits. Thyroid is normal. No significant adenopathy is present. No focal mucosal or submucosal lesions are present. Upper chest: The lung apices are clear. The thoracic inlet is within normal limits. Review of the MIP images confirms the above findings CTA HEAD FINDINGS Anterior circulation: Atherosclerotic calcifications are present within the cavernous internal carotid arteries without a significant stenosis through the ICA termini. The left A1 segment is dominant. The anterior communicating artery is patent. The right M1 segment is normal. Moderate stenosis is present in mid left M1 segment. MCA bifurcations are intact bilaterally. Moderate  attenuation of distal ACA and MCA branch vessels present without a significant focal stenosis or occlusion with in the proximal circle-of-Willis. No aneurysm is present. Posterior circulation: The vertebral arteries are codominant. Left PICA origin is visualized and normal. The vertebrobasilar junction basilar artery normal. Both posterior cerebral arteries originate basilar tip. Moderate segmental stenoses are present in the left P2 segment. The PCA branch vessels are otherwise within normal limits. No aneurysm is present. Venous sinuses: The dural sinuses are patent. The straight sinus and deep cerebral veins are intact. Cortical veins are within normal limits. No significant vascular malformation is evident. Anatomic variants: None Review of the MIP images confirms the above findings CT Brain Perfusion Findings: ASPECTS: 10/10 CBF (<30%) Volume: 0mL Perfusion (Tmax>6.0s) volume: 0mL Mismatch Volume: 0mL IMPRESSION: 1. No large vessel occlusion. 2. Moderate stenosis in the mid left M1 segment. 3. Moderate segmental stenoses in the left P2 segment. 4. Moderate attenuation of distal ACA and MCA branch vessels without a significant focal stenosis or occlusion. 5. Minimal atherosclerotic changes at the carotid bifurcations and cavernous internal carotid arteries without significant stenosis. 6. Moderate degenerative changes in the cervical spine. 7. Moderate right foraminal stenosis at C3-4 and moderate foraminal stenosis bilaterally at C6-7. 8. CT perfusion is negative. The above was relayed via text pager to Dr. Amada Jupiter on 05/26/2023 at 15:50 . Electronically Signed   By: Marin Roberts M.D.   On: 05/26/2023 15:52   CT HEAD CODE STROKE WO CONTRAST Result Date: 05/26/2023 CLINICAL DATA:  Code stroke.  Left-sided paralysis. EXAM: CT HEAD WITHOUT CONTRAST TECHNIQUE: Contiguous axial images were obtained from the base of the skull through the vertex without intravenous contrast. RADIATION DOSE REDUCTION: This  exam was performed according to the departmental dose-optimization program which includes automated exposure control, adjustment of the mA and/or kV according to patient size and/or use of iterative reconstruction technique.  COMPARISON:  None available FINDINGS: Brain: Encephalomalacia involving the posterior and medial right parietal lobe appears remote. Moderate generalized atrophy and white matter disease is present bilaterally. No acute infarct, hemorrhage or mass lesion is present. Deep brain nuclei are within normal limits. The ventricles are of normal size. No significant extraaxial fluid collection is present. The brainstem and cerebellum are within normal limits. Midline structures are within normal limits. Vascular: Atherosclerotic calcifications are present within the cavernous internal carotid arteries bilaterally and at the dural margin of the left vertebral artery. No hyperdense vessel is present. Skull: Calvarium is intact. No focal lytic or blastic lesions are present. No significant extracranial soft tissue lesion is present. Advanced degenerative changes are present in the right TMJ. Sinuses/Orbits: The paranasal sinuses and mastoid air cells are clear. The globes and orbits are within normal limits. ASPECTS [facility] Stroke Program Early CT Score) - Ganglionic level infarction (caudate, lentiform nuclei, internal capsule, insula, M1-M3 cortex): 7/7 - Supraganglionic infarction (M4-M6 cortex): 3/3 Total score (0-10 with 10 being normal): 10/10 IMPRESSION: 1. No acute intracranial abnormality or significant interval change. 2. Encephalomalacia involving the posterior and medial right parietal lobe appears remote. 3. Moderate generalized atrophy and white matter disease likely reflects the sequela of chronic microvascular ischemia. 4. Aspects is 10/10. 5. Advanced degenerative changes in the right TMJ. The above was relayed via text pager to Dr. Amada Jupiter on 05/26/2023 at 15:28 . Electronically Signed    By: Marin Roberts M.D.   On: 05/26/2023 15:31      Subjective:  No significant events overnight, patient sleeping comfortably,    Discharge Exam: Vitals:   06/10/23 0800 06/10/23 1200  BP: (!) 181/102 (!) 189/102  Pulse: 85 97  Resp: 14 12  Temp: 98.5 F (36.9 C)   SpO2: 91% 91%   Vitals:   06/09/23 1935 06/10/23 0748 06/10/23 0800 06/10/23 1200  BP: 120/68 (!) 182/110 (!) 181/102 (!) 189/102  Pulse: 98 83 85 97  Resp: 20 14 14 12   Temp:  98.5 F (36.9 C) 98.5 F (36.9 C)   TempSrc:  Oral Oral   SpO2: 92% 91% 91% 91%  Weight:      Height:        Sleeping comfortably, not arousable, does not withdraw to pain. Good air entry Regular rate and rhythm No Cyanosis, Clubbing or edema      The results of significant diagnostics from this hospitalization (including imaging, microbiology, ancillary and laboratory) are listed below for reference.     Microbiology: No results found for this or any previous visit (from the past 240 hours).   Labs: BNP (last 3 results) Recent Labs    05/31/23 0430 06/01/23 0443 06/02/23 0519  BNP 33.8 42.0 69.8   Basic Metabolic Panel: Recent Labs  Lab 06/05/23 0556  NA 140  K 3.9  CL 106  CO2 26  GLUCOSE 95  BUN 38*  CREATININE 1.32*  CALCIUM 8.9  MG 1.9   Liver Function Tests: Recent Labs  Lab 06/04/23 1512  AST 29  ALT 20  ALKPHOS 61  BILITOT 0.7  PROT 5.4*  ALBUMIN 2.6*   No results for input(s): "LIPASE", "AMYLASE" in the last 168 hours. Recent Labs  Lab 06/04/23 1512  AMMONIA 28   CBC: No results for input(s): "WBC", "NEUTROABS", "HGB", "HCT", "MCV", "PLT" in the last 168 hours. Cardiac Enzymes: No results for input(s): "CKTOTAL", "CKMB", "CKMBINDEX", "TROPONINI" in the last 168 hours. BNP: Invalid input(s): "POCBNP" CBG: Recent Labs  Lab  06/05/23 1526 06/06/23 0848 06/06/23 1305 06/06/23 1734 06/09/23 1012  GLUCAP 89 79 91 90 163*   D-Dimer No results for input(s): "DDIMER" in  the last 72 hours. Hgb A1c No results for input(s): "HGBA1C" in the last 72 hours. Lipid Profile No results for input(s): "CHOL", "HDL", "LDLCALC", "TRIG", "CHOLHDL", "LDLDIRECT" in the last 72 hours. Thyroid function studies No results for input(s): "TSH", "T4TOTAL", "T3FREE", "THYROIDAB" in the last 72 hours.  Invalid input(s): "FREET3" Anemia work up No results for input(s): "VITAMINB12", "FOLATE", "FERRITIN", "TIBC", "IRON", "RETICCTPCT" in the last 72 hours. Urinalysis    Component Value Date/Time   COLORURINE STRAW (A) 05/26/2023 2044   APPEARANCEUR CLEAR 05/26/2023 2044   LABSPEC 1.020 05/26/2023 2044   PHURINE 6.0 05/26/2023 2044   GLUCOSEU 50 (A) 05/26/2023 2044   HGBUR NEGATIVE 05/26/2023 2044   BILIRUBINUR NEGATIVE 05/26/2023 2044   KETONESUR NEGATIVE 05/26/2023 2044   PROTEINUR NEGATIVE 05/26/2023 2044   NITRITE NEGATIVE 05/26/2023 2044   LEUKOCYTESUR NEGATIVE 05/26/2023 2044   Sepsis Labs No results for input(s): "WBC" in the last 168 hours.  Invalid input(s): "PROCALCITONIN", "LACTICIDVEN" Microbiology No results found for this or any previous visit (from the past 240 hours).   Time coordinating discharge: Over 30 minutes  SIGNED:   Huey Bienenstock, MD  [facility] 06/10/2023, 3:00 PM Pager   If 7PM-7AM, please contact night-coverage [email or web address]

## 2023-06-10 NOTE — Progress Notes (Signed)
 Patient discharged to [facility]. Left unit in care of ptar.

## 2023-06-13 ENCOUNTER — Encounter: Payer: Self-pay | Admitting: Emergency Medicine

## 2023-06-15 ENCOUNTER — Other Ambulatory Visit: Payer: Self-pay | Admitting: Family Medicine

## 2023-06-15 DIAGNOSIS — I129 Hypertensive chronic kidney disease with stage 1 through stage 4 chronic kidney disease, or unspecified chronic kidney disease: Secondary | ICD-10-CM

## 2023-06-16 NOTE — Telephone Encounter (Signed)
 Courtesy refill already given, OV needed.  Requested Prescriptions  Pending Prescriptions Disp Refills   amLODipine (NORVASC) 5 MG tablet [Pharmacy Med Name: AMLODIPINE BESYLATE 5 MG TAB] 90 tablet 1    Sig: TAKE 1 TABLET (5 MG TOTAL) BY MOUTH DAILY.     Cardiovascular: Calcium Channel Blockers 2 Failed - 06/16/2023  3:57 PM      Failed - Last BP in normal range    BP Readings from Last 1 Encounters:  06/10/23 (!) 140/92         Failed - Valid encounter within last 6 months    Recent Outpatient Visits   None            Passed - Last Heart Rate in normal range    Pulse Readings from Last 1 Encounters:  06/10/23 95

## 2023-06-21 DEATH — deceased
